# Patient Record
Sex: Female | Born: 1935 | Race: White | Hispanic: No | Smoking: Former smoker
Health system: Southern US, Community
[De-identification: ages and names within clinical notes are randomized; demographics above are authoritative.]

## PROBLEM LIST (undated history)

## (undated) DIAGNOSIS — I2510 Atherosclerotic heart disease of native coronary artery without angina pectoris: Secondary | ICD-10-CM

## (undated) DIAGNOSIS — G459 Transient cerebral ischemic attack, unspecified: Secondary | ICD-10-CM

## (undated) DIAGNOSIS — F419 Anxiety disorder, unspecified: Secondary | ICD-10-CM

## (undated) DIAGNOSIS — I255 Ischemic cardiomyopathy: Secondary | ICD-10-CM

## (undated) DIAGNOSIS — J189 Pneumonia, unspecified organism: Secondary | ICD-10-CM

## (undated) DIAGNOSIS — E669 Obesity, unspecified: Secondary | ICD-10-CM

## (undated) DIAGNOSIS — I82409 Acute embolism and thrombosis of unspecified deep veins of unspecified lower extremity: Secondary | ICD-10-CM

## (undated) DIAGNOSIS — D649 Anemia, unspecified: Secondary | ICD-10-CM

## (undated) DIAGNOSIS — R0989 Other specified symptoms and signs involving the circulatory and respiratory systems: Secondary | ICD-10-CM

## (undated) DIAGNOSIS — N183 Chronic kidney disease, stage 3 unspecified: Secondary | ICD-10-CM

## (undated) DIAGNOSIS — I10 Essential (primary) hypertension: Secondary | ICD-10-CM

## (undated) DIAGNOSIS — E785 Hyperlipidemia, unspecified: Secondary | ICD-10-CM

## (undated) DIAGNOSIS — E119 Type 2 diabetes mellitus without complications: Secondary | ICD-10-CM

## (undated) DIAGNOSIS — I639 Cerebral infarction, unspecified: Secondary | ICD-10-CM

## (undated) DIAGNOSIS — I6529 Occlusion and stenosis of unspecified carotid artery: Secondary | ICD-10-CM

## (undated) DIAGNOSIS — R809 Proteinuria, unspecified: Secondary | ICD-10-CM

## (undated) DIAGNOSIS — I214 Non-ST elevation (NSTEMI) myocardial infarction: Secondary | ICD-10-CM

## (undated) HISTORY — DX: Occlusion and stenosis of unspecified carotid artery: I65.29

## (undated) HISTORY — DX: Other specified symptoms and signs involving the circulatory and respiratory systems: R09.89

## (undated) HISTORY — DX: Hyperlipidemia, unspecified: E78.5

## (undated) HISTORY — PX: CATARACT EXTRACTION, BILATERAL: SHX1313

## (undated) HISTORY — PX: APPENDECTOMY: SHX54

## (undated) HISTORY — DX: Non-ST elevation (NSTEMI) myocardial infarction: I21.4

## (undated) HISTORY — DX: Cerebral infarction, unspecified: I63.9

## (undated) HISTORY — PX: ANKLE FUSION: SHX881

## (undated) HISTORY — DX: Acute embolism and thrombosis of unspecified deep veins of unspecified lower extremity: I82.409

## (undated) HISTORY — DX: Essential (primary) hypertension: I10

## (undated) HISTORY — PX: TONSILLECTOMY: SUR1361

## (undated) HISTORY — DX: Obesity, unspecified: E66.9

## (undated) HISTORY — DX: Ischemic cardiomyopathy: I25.5

## (undated) HISTORY — DX: Anxiety disorder, unspecified: F41.9

## (undated) HISTORY — DX: Transient cerebral ischemic attack, unspecified: G45.9

## (undated) HISTORY — DX: Anemia, unspecified: D64.9

## (undated) HISTORY — DX: Proteinuria, unspecified: R80.9

## (undated) HISTORY — DX: Atherosclerotic heart disease of native coronary artery without angina pectoris: I25.10

---

## 1997-11-25 ENCOUNTER — Other Ambulatory Visit: Admission: RE | Admit: 1997-11-25 | Discharge: 1997-11-25 | Payer: Self-pay | Admitting: Family Medicine

## 1998-01-05 ENCOUNTER — Ambulatory Visit (HOSPITAL_COMMUNITY): Admission: RE | Admit: 1998-01-05 | Discharge: 1998-01-05 | Payer: Self-pay

## 1998-03-25 ENCOUNTER — Ambulatory Visit (HOSPITAL_COMMUNITY): Admission: RE | Admit: 1998-03-25 | Discharge: 1998-03-25 | Payer: Self-pay | Admitting: Family Medicine

## 1999-01-11 ENCOUNTER — Other Ambulatory Visit: Admission: RE | Admit: 1999-01-11 | Discharge: 1999-01-11 | Payer: Self-pay | Admitting: Family Medicine

## 1999-03-01 ENCOUNTER — Inpatient Hospital Stay (HOSPITAL_COMMUNITY): Admission: EM | Admit: 1999-03-01 | Discharge: 1999-03-02 | Payer: Self-pay | Admitting: Emergency Medicine

## 1999-03-01 ENCOUNTER — Encounter: Payer: Self-pay | Admitting: Emergency Medicine

## 1999-03-02 ENCOUNTER — Encounter: Payer: Self-pay | Admitting: Cardiology

## 2000-06-22 ENCOUNTER — Inpatient Hospital Stay (HOSPITAL_COMMUNITY): Admission: EM | Admit: 2000-06-22 | Discharge: 2000-06-27 | Payer: Self-pay | Admitting: Emergency Medicine

## 2000-06-22 ENCOUNTER — Encounter: Payer: Self-pay | Admitting: Orthopedic Surgery

## 2000-06-22 ENCOUNTER — Encounter: Payer: Self-pay | Admitting: Emergency Medicine

## 2001-04-26 ENCOUNTER — Ambulatory Visit (HOSPITAL_COMMUNITY): Admission: RE | Admit: 2001-04-26 | Discharge: 2001-04-26 | Payer: Self-pay | Admitting: Orthopedic Surgery

## 2001-11-05 ENCOUNTER — Encounter: Payer: Self-pay | Admitting: Orthopedic Surgery

## 2001-11-05 ENCOUNTER — Encounter: Admission: RE | Admit: 2001-11-05 | Discharge: 2001-11-05 | Payer: Self-pay | Admitting: Orthopedic Surgery

## 2001-11-06 ENCOUNTER — Ambulatory Visit (HOSPITAL_BASED_OUTPATIENT_CLINIC_OR_DEPARTMENT_OTHER): Admission: RE | Admit: 2001-11-06 | Discharge: 2001-11-06 | Payer: Self-pay | Admitting: Orthopedic Surgery

## 2001-12-17 ENCOUNTER — Encounter: Payer: Self-pay | Admitting: Orthopedic Surgery

## 2001-12-17 ENCOUNTER — Encounter: Admission: RE | Admit: 2001-12-17 | Discharge: 2001-12-17 | Payer: Self-pay | Admitting: Orthopedic Surgery

## 2001-12-18 ENCOUNTER — Ambulatory Visit (HOSPITAL_BASED_OUTPATIENT_CLINIC_OR_DEPARTMENT_OTHER): Admission: RE | Admit: 2001-12-18 | Discharge: 2001-12-18 | Payer: Self-pay | Admitting: Orthopedic Surgery

## 2003-01-06 ENCOUNTER — Encounter: Payer: Self-pay | Admitting: Emergency Medicine

## 2003-01-06 ENCOUNTER — Emergency Department (HOSPITAL_COMMUNITY): Admission: EM | Admit: 2003-01-06 | Discharge: 2003-01-06 | Payer: Self-pay | Admitting: Emergency Medicine

## 2003-03-22 HISTORY — PX: FRACTURE SURGERY: SHX138

## 2003-08-06 ENCOUNTER — Ambulatory Visit (HOSPITAL_COMMUNITY): Admission: RE | Admit: 2003-08-06 | Discharge: 2003-08-06 | Payer: Self-pay | Admitting: Gastroenterology

## 2003-08-06 ENCOUNTER — Encounter (INDEPENDENT_AMBULATORY_CARE_PROVIDER_SITE_OTHER): Payer: Self-pay | Admitting: Specialist

## 2004-01-21 ENCOUNTER — Ambulatory Visit (HOSPITAL_COMMUNITY): Admission: RE | Admit: 2004-01-21 | Discharge: 2004-01-21 | Payer: Self-pay | Admitting: Cardiology

## 2004-06-02 ENCOUNTER — Ambulatory Visit (HOSPITAL_COMMUNITY): Admission: RE | Admit: 2004-06-02 | Discharge: 2004-06-02 | Payer: Self-pay | Admitting: Family Medicine

## 2004-10-22 ENCOUNTER — Ambulatory Visit (HOSPITAL_COMMUNITY): Admission: RE | Admit: 2004-10-22 | Discharge: 2004-10-22 | Payer: Self-pay | Admitting: Family Medicine

## 2005-03-04 ENCOUNTER — Encounter: Admission: RE | Admit: 2005-03-04 | Discharge: 2005-03-04 | Payer: Self-pay | Admitting: Family Medicine

## 2005-07-22 ENCOUNTER — Ambulatory Visit (HOSPITAL_COMMUNITY): Admission: RE | Admit: 2005-07-22 | Discharge: 2005-07-22 | Payer: Self-pay | Admitting: Cardiology

## 2005-07-22 ENCOUNTER — Encounter (INDEPENDENT_AMBULATORY_CARE_PROVIDER_SITE_OTHER): Payer: Self-pay | Admitting: *Deleted

## 2005-10-11 ENCOUNTER — Ambulatory Visit (HOSPITAL_COMMUNITY): Admission: RE | Admit: 2005-10-11 | Discharge: 2005-10-11 | Payer: Self-pay | Admitting: Vascular Surgery

## 2005-11-08 ENCOUNTER — Ambulatory Visit (HOSPITAL_BASED_OUTPATIENT_CLINIC_OR_DEPARTMENT_OTHER): Admission: RE | Admit: 2005-11-08 | Discharge: 2005-11-08 | Payer: Self-pay | Admitting: Orthopedic Surgery

## 2007-04-23 ENCOUNTER — Encounter: Admission: RE | Admit: 2007-04-23 | Discharge: 2007-04-23 | Payer: Self-pay | Admitting: Nephrology

## 2008-03-10 ENCOUNTER — Ambulatory Visit: Payer: Self-pay | Admitting: Internal Medicine

## 2008-03-11 ENCOUNTER — Encounter: Payer: Self-pay | Admitting: Cardiology

## 2008-03-11 ENCOUNTER — Ambulatory Visit: Payer: Self-pay | Admitting: Vascular Surgery

## 2008-03-11 ENCOUNTER — Inpatient Hospital Stay (HOSPITAL_COMMUNITY): Admission: EM | Admit: 2008-03-11 | Discharge: 2008-03-12 | Payer: Self-pay | Admitting: Emergency Medicine

## 2009-01-22 ENCOUNTER — Encounter (HOSPITAL_BASED_OUTPATIENT_CLINIC_OR_DEPARTMENT_OTHER): Admission: RE | Admit: 2009-01-22 | Discharge: 2009-03-24 | Payer: Self-pay | Admitting: Internal Medicine

## 2009-02-06 ENCOUNTER — Ambulatory Visit (HOSPITAL_COMMUNITY): Admission: RE | Admit: 2009-02-06 | Discharge: 2009-02-06 | Payer: Self-pay | Admitting: Internal Medicine

## 2009-03-25 ENCOUNTER — Encounter (HOSPITAL_BASED_OUTPATIENT_CLINIC_OR_DEPARTMENT_OTHER): Admission: RE | Admit: 2009-03-25 | Discharge: 2009-06-10 | Payer: Self-pay | Admitting: Internal Medicine

## 2009-05-05 ENCOUNTER — Ambulatory Visit: Admission: RE | Admit: 2009-05-05 | Discharge: 2009-05-05 | Payer: Self-pay | Admitting: General Surgery

## 2009-05-05 ENCOUNTER — Ambulatory Visit: Payer: Self-pay | Admitting: Vascular Surgery

## 2009-05-05 ENCOUNTER — Encounter (HOSPITAL_BASED_OUTPATIENT_CLINIC_OR_DEPARTMENT_OTHER): Payer: Self-pay | Admitting: General Surgery

## 2009-05-11 ENCOUNTER — Ambulatory Visit (HOSPITAL_COMMUNITY): Admission: RE | Admit: 2009-05-11 | Discharge: 2009-05-11 | Payer: Self-pay | Admitting: Internal Medicine

## 2009-05-22 ENCOUNTER — Ambulatory Visit: Payer: Self-pay | Admitting: Vascular Surgery

## 2009-05-27 ENCOUNTER — Ambulatory Visit (HOSPITAL_COMMUNITY): Admission: RE | Admit: 2009-05-27 | Discharge: 2009-05-27 | Payer: Self-pay | Admitting: General Surgery

## 2009-06-04 ENCOUNTER — Ambulatory Visit (HOSPITAL_COMMUNITY): Admission: RE | Admit: 2009-06-04 | Discharge: 2009-06-04 | Payer: Self-pay | Admitting: General Surgery

## 2009-06-04 ENCOUNTER — Encounter (HOSPITAL_BASED_OUTPATIENT_CLINIC_OR_DEPARTMENT_OTHER): Payer: Self-pay | Admitting: General Surgery

## 2009-06-11 ENCOUNTER — Encounter (HOSPITAL_BASED_OUTPATIENT_CLINIC_OR_DEPARTMENT_OTHER): Admission: RE | Admit: 2009-06-11 | Discharge: 2009-07-10 | Payer: Self-pay | Admitting: Internal Medicine

## 2009-06-24 ENCOUNTER — Encounter (HOSPITAL_BASED_OUTPATIENT_CLINIC_OR_DEPARTMENT_OTHER): Payer: Self-pay | Admitting: General Surgery

## 2009-06-24 ENCOUNTER — Ambulatory Visit: Payer: Self-pay | Admitting: Vascular Surgery

## 2009-06-24 ENCOUNTER — Ambulatory Visit: Admission: RE | Admit: 2009-06-24 | Discharge: 2009-06-24 | Payer: Self-pay | Admitting: General Surgery

## 2009-07-13 ENCOUNTER — Encounter (HOSPITAL_BASED_OUTPATIENT_CLINIC_OR_DEPARTMENT_OTHER): Admission: RE | Admit: 2009-07-13 | Discharge: 2009-08-03 | Payer: Self-pay | Admitting: Internal Medicine

## 2009-08-04 ENCOUNTER — Encounter (HOSPITAL_BASED_OUTPATIENT_CLINIC_OR_DEPARTMENT_OTHER): Admission: RE | Admit: 2009-08-04 | Discharge: 2009-08-25 | Payer: Self-pay | Admitting: General Surgery

## 2009-08-26 ENCOUNTER — Encounter (HOSPITAL_BASED_OUTPATIENT_CLINIC_OR_DEPARTMENT_OTHER): Admission: RE | Admit: 2009-08-26 | Discharge: 2009-09-18 | Payer: Self-pay | Admitting: General Surgery

## 2009-09-05 ENCOUNTER — Inpatient Hospital Stay (HOSPITAL_COMMUNITY): Admission: EM | Admit: 2009-09-05 | Discharge: 2009-09-08 | Payer: Self-pay | Admitting: Emergency Medicine

## 2009-09-07 ENCOUNTER — Ambulatory Visit: Payer: Self-pay | Admitting: Vascular Surgery

## 2009-09-07 ENCOUNTER — Encounter (INDEPENDENT_AMBULATORY_CARE_PROVIDER_SITE_OTHER): Payer: Self-pay | Admitting: Internal Medicine

## 2010-01-13 ENCOUNTER — Ambulatory Visit: Payer: Self-pay | Admitting: Cardiology

## 2010-03-21 DIAGNOSIS — I214 Non-ST elevation (NSTEMI) myocardial infarction: Secondary | ICD-10-CM

## 2010-03-21 HISTORY — DX: Non-ST elevation (NSTEMI) myocardial infarction: I21.4

## 2010-04-03 ENCOUNTER — Telehealth (INDEPENDENT_AMBULATORY_CARE_PROVIDER_SITE_OTHER): Payer: Self-pay | Admitting: *Deleted

## 2010-04-03 ENCOUNTER — Inpatient Hospital Stay (HOSPITAL_COMMUNITY)
Admission: EM | Admit: 2010-04-03 | Discharge: 2010-04-13 | Payer: Self-pay | Source: Home / Self Care | Attending: Cardiology | Admitting: Cardiology

## 2010-04-05 HISTORY — PX: CORONARY ANGIOPLASTY WITH STENT PLACEMENT: SHX49

## 2010-04-05 LAB — BASIC METABOLIC PANEL
BUN: 19 mg/dL (ref 6–23)
BUN: 20 mg/dL (ref 6–23)
CO2: 23 mEq/L (ref 19–32)
CO2: 26 mEq/L (ref 19–32)
Calcium: 9.1 mg/dL (ref 8.4–10.5)
Calcium: 8.8 mg/dL (ref 8.4–10.5)
Chloride: 113 mEq/L — ABNORMAL HIGH (ref 96–112)
Chloride: 110 mEq/L (ref 96–112)
Creatinine, Ser: 1.3 mg/dL — ABNORMAL HIGH (ref 0.4–1.2)
Creatinine, Ser: 1.4 mg/dL — ABNORMAL HIGH (ref 0.4–1.2)
GFR calc Af Amer: 48 mL/min — ABNORMAL LOW (ref >60)
GFR calc Af Amer: 44 mL/min — ABNORMAL LOW (ref >60)
GFR calc non Af Amer: 39 mL/min — ABNORMAL LOW (ref >60)
GFR calc non Af Amer: 36 mL/min — ABNORMAL LOW (ref >60)
Glucose, Bld: 123 mg/dL — ABNORMAL HIGH (ref 70–99)
Glucose, Bld: 152 mg/dL — ABNORMAL HIGH (ref 70–99)
Potassium: 4.1 mEq/L (ref 3.5–5.1)
Potassium: 4.8 mEq/L (ref 3.5–5.1)
Sodium: 142 mEq/L (ref 135–145)
Sodium: 142 mEq/L (ref 135–145)

## 2010-04-05 LAB — CARDIAC PANEL(CRET KIN+CKTOT+MB+TROPI)
CK, MB: 3.1 ng/mL (ref 0.3–4.0)
CK, MB: 2.8 ng/mL (ref 0.3–4.0)
Relative Index: INVALID (ref 0.0–2.5)
Relative Index: 2.6 — ABNORMAL HIGH (ref 0.0–2.5)
Total CK: 95 U/L (ref 7–177)
Total CK: 107 U/L (ref 7–177)
Troponin I: 0.1 ng/mL — ABNORMAL HIGH (ref 0.00–0.06)
Troponin I: 0.06 ng/mL (ref 0.00–0.06)

## 2010-04-05 LAB — DIFFERENTIAL
Basophils Absolute: 0 10*3/uL (ref 0.0–0.1)
Basophils Relative: 0 % (ref 0–1)
Eosinophils Absolute: 0 10*3/uL (ref 0.0–0.7)
Eosinophils Relative: 1 % (ref 0–5)
Lymphocytes Relative: 32 % (ref 12–46)
Lymphs Abs: 0.9 10*3/uL (ref 0.7–4.0)
Monocytes Absolute: 0.2 10*3/uL (ref 0.1–1.0)
Monocytes Relative: 8 % (ref 3–12)
Neutro Abs: 1.7 10*3/uL (ref 1.7–7.7)
Neutrophils Relative %: 59 % (ref 43–77)

## 2010-04-05 LAB — LIPID PANEL
Cholesterol: 108 mg/dL (ref 0–200)
HDL: 52 mg/dL (ref >39)
LDL Cholesterol: 36 mg/dL (ref 0–99)
Total CHOL/HDL Ratio: 2.1 RATIO
Triglycerides: 100 mg/dL (ref <150)
VLDL: 20 mg/dL (ref 0–40)

## 2010-04-05 LAB — HEMOGLOBIN A1C
Hgb A1c MFr Bld: 7.6 % — ABNORMAL HIGH (ref <5.7)
Mean Plasma Glucose: 171 mg/dL — ABNORMAL HIGH (ref <117)

## 2010-04-05 LAB — GLUCOSE, CAPILLARY
Glucose-Capillary: 121 mg/dL — ABNORMAL HIGH (ref 70–99)
Glucose-Capillary: 144 mg/dL — ABNORMAL HIGH (ref 70–99)
Glucose-Capillary: 140 mg/dL — ABNORMAL HIGH (ref 70–99)
Glucose-Capillary: 97 mg/dL (ref 70–99)

## 2010-04-05 LAB — CBC
HCT: 34 % — ABNORMAL LOW (ref 36.0–46.0)
HCT: 31.7 % — ABNORMAL LOW (ref 36.0–46.0)
Hemoglobin: 11.1 g/dL — ABNORMAL LOW (ref 12.0–15.0)
Hemoglobin: 10.5 g/dL — ABNORMAL LOW (ref 12.0–15.0)
MCH: 30.1 pg (ref 26.0–34.0)
MCH: 30.3 pg (ref 26.0–34.0)
MCHC: 32.6 g/dL (ref 30.0–36.0)
MCHC: 33.1 g/dL (ref 30.0–36.0)
MCV: 92.1 fL (ref 78.0–100.0)
MCV: 91.6 fL (ref 78.0–100.0)
Platelets: 157 10*3/uL (ref 150–400)
Platelets: 147 10*3/uL — ABNORMAL LOW (ref 150–400)
RBC: 3.69 MIL/uL — ABNORMAL LOW (ref 3.87–5.11)
RBC: 3.46 MIL/uL — ABNORMAL LOW (ref 3.87–5.11)
RDW: 12.4 % (ref 11.5–15.5)
RDW: 12.5 % (ref 11.5–15.5)
WBC: 2.8 10*3/uL — ABNORMAL LOW (ref 4.0–10.5)
WBC: 4.6 10*3/uL (ref 4.0–10.5)

## 2010-04-05 LAB — TROPONIN I
Troponin I: 0.09 ng/mL — ABNORMAL HIGH (ref 0.00–0.06)
Troponin I: 0.16 ng/mL — ABNORMAL HIGH (ref 0.00–0.06)

## 2010-04-05 LAB — TSH: TSH: 2.297 u[IU]/mL (ref 0.350–4.500)

## 2010-04-05 LAB — CK TOTAL AND CKMB (NOT AT ARMC)
CK, MB: 2.6 ng/mL (ref 0.3–4.0)
CK, MB: 3.1 ng/mL (ref 0.3–4.0)
Relative Index: INVALID (ref 0.0–2.5)
Relative Index: INVALID (ref 0.0–2.5)
Total CK: 95 U/L (ref 7–177)
Total CK: 92 U/L (ref 7–177)

## 2010-04-05 LAB — HEPARIN LEVEL (UNFRACTIONATED)
Heparin Unfractionated: 0.43 IU/mL (ref 0.30–0.70)
Heparin Unfractionated: 0.33 IU/mL (ref 0.30–0.70)

## 2010-04-05 LAB — MRSA PCR SCREENING: MRSA by PCR: NEGATIVE

## 2010-04-07 LAB — CBC
HCT: 32.7 % — ABNORMAL LOW (ref 36.0–46.0)
HCT: 29.2 % — ABNORMAL LOW (ref 36.0–46.0)
Hemoglobin: 10.8 g/dL — ABNORMAL LOW (ref 12.0–15.0)
Hemoglobin: 9.6 g/dL — ABNORMAL LOW (ref 12.0–15.0)
MCH: 30.1 pg (ref 26.0–34.0)
MCH: 30.3 pg (ref 26.0–34.0)
MCHC: 33 g/dL (ref 30.0–36.0)
MCHC: 32.9 g/dL (ref 30.0–36.0)
MCV: 91.1 fL (ref 78.0–100.0)
MCV: 92.1 fL (ref 78.0–100.0)
Platelets: 148 10*3/uL — ABNORMAL LOW (ref 150–400)
Platelets: 140 10*3/uL — ABNORMAL LOW (ref 150–400)
RBC: 3.59 MIL/uL — ABNORMAL LOW (ref 3.87–5.11)
RBC: 3.17 MIL/uL — ABNORMAL LOW (ref 3.87–5.11)
RDW: 12.4 % (ref 11.5–15.5)
RDW: 12.6 % (ref 11.5–15.5)
WBC: 3.3 10*3/uL — ABNORMAL LOW (ref 4.0–10.5)
WBC: 2.9 10*3/uL — ABNORMAL LOW (ref 4.0–10.5)

## 2010-04-07 LAB — GLUCOSE, CAPILLARY
Glucose-Capillary: 121 mg/dL — ABNORMAL HIGH (ref 70–99)
Glucose-Capillary: 98 mg/dL (ref 70–99)
Glucose-Capillary: 141 mg/dL — ABNORMAL HIGH (ref 70–99)
Glucose-Capillary: 95 mg/dL (ref 70–99)
Glucose-Capillary: 82 mg/dL (ref 70–99)
Glucose-Capillary: 78 mg/dL (ref 70–99)
Glucose-Capillary: 154 mg/dL — ABNORMAL HIGH (ref 70–99)
Glucose-Capillary: 129 mg/dL — ABNORMAL HIGH (ref 70–99)

## 2010-04-07 LAB — BASIC METABOLIC PANEL
BUN: 24 mg/dL — ABNORMAL HIGH (ref 6–23)
BUN: 23 mg/dL (ref 6–23)
BUN: 21 mg/dL (ref 6–23)
CO2: 27 mEq/L (ref 19–32)
CO2: 28 mEq/L (ref 19–32)
CO2: 25 mEq/L (ref 19–32)
Calcium: 8.7 mg/dL (ref 8.4–10.5)
Calcium: 8.5 mg/dL (ref 8.4–10.5)
Calcium: 8.6 mg/dL (ref 8.4–10.5)
Chloride: 103 mEq/L (ref 96–112)
Chloride: 105 mEq/L (ref 96–112)
Chloride: 115 mEq/L — ABNORMAL HIGH (ref 96–112)
Creatinine, Ser: 1.48 mg/dL — ABNORMAL HIGH (ref 0.4–1.2)
Creatinine, Ser: 1.43 mg/dL — ABNORMAL HIGH (ref 0.4–1.2)
Creatinine, Ser: 1.27 mg/dL — ABNORMAL HIGH (ref 0.4–1.2)
GFR calc Af Amer: 41 mL/min — ABNORMAL LOW (ref >60)
GFR calc Af Amer: 43 mL/min — ABNORMAL LOW (ref >60)
GFR calc Af Amer: 49 mL/min — ABNORMAL LOW (ref >60)
GFR calc non Af Amer: 34 mL/min — ABNORMAL LOW (ref >60)
GFR calc non Af Amer: 35 mL/min — ABNORMAL LOW (ref >60)
GFR calc non Af Amer: 40 mL/min — ABNORMAL LOW (ref >60)
Glucose, Bld: 109 mg/dL — ABNORMAL HIGH (ref 70–99)
Glucose, Bld: 161 mg/dL — ABNORMAL HIGH (ref 70–99)
Glucose, Bld: 88 mg/dL (ref 70–99)
Potassium: 3.9 mEq/L (ref 3.5–5.1)
Potassium: 3.9 mEq/L (ref 3.5–5.1)
Potassium: 5 mEq/L (ref 3.5–5.1)
Sodium: 137 mEq/L (ref 135–145)
Sodium: 140 mEq/L (ref 135–145)
Sodium: 143 mEq/L (ref 135–145)

## 2010-04-07 LAB — PROTIME-INR
INR: 1.05 (ref 0.00–1.49)
Prothrombin Time: 13.9 seconds (ref 11.6–15.2)

## 2010-04-07 LAB — HEPARIN LEVEL (UNFRACTIONATED): Heparin Unfractionated: 0.47 IU/mL (ref 0.30–0.70)

## 2010-04-12 LAB — GLUCOSE, CAPILLARY
Glucose-Capillary: 292 mg/dL — ABNORMAL HIGH (ref 70–99)
Glucose-Capillary: 118 mg/dL — ABNORMAL HIGH (ref 70–99)
Glucose-Capillary: 225 mg/dL — ABNORMAL HIGH (ref 70–99)
Glucose-Capillary: 170 mg/dL — ABNORMAL HIGH (ref 70–99)
Glucose-Capillary: 203 mg/dL — ABNORMAL HIGH (ref 70–99)
Glucose-Capillary: 128 mg/dL — ABNORMAL HIGH (ref 70–99)
Glucose-Capillary: 206 mg/dL — ABNORMAL HIGH (ref 70–99)

## 2010-04-12 LAB — BASIC METABOLIC PANEL
BUN: 27 mg/dL — ABNORMAL HIGH (ref 6–23)
BUN: 35 mg/dL — ABNORMAL HIGH (ref 6–23)
CO2: 23 mEq/L (ref 19–32)
CO2: 18 mEq/L — ABNORMAL LOW (ref 19–32)
CO2: 21 mEq/L (ref 19–32)
Calcium: 8.5 mg/dL (ref 8.4–10.5)
Calcium: 8.8 mg/dL (ref 8.4–10.5)
Calcium: 8.5 mg/dL (ref 8.4–10.5)
Chloride: 113 mEq/L — ABNORMAL HIGH (ref 96–112)
Creatinine, Ser: 1.54 mg/dL — ABNORMAL HIGH (ref 0.4–1.2)
Creatinine, Ser: 1.48 mg/dL — ABNORMAL HIGH (ref 0.4–1.2)
Creatinine, Ser: 1.43 mg/dL — ABNORMAL HIGH (ref 0.4–1.2)
GFR calc Af Amer: 39 mL/min — ABNORMAL LOW (ref >60)
GFR calc Af Amer: 47 mL/min — ABNORMAL LOW (ref >60)
GFR calc non Af Amer: 32 mL/min — ABNORMAL LOW (ref >60)
GFR calc non Af Amer: 39 mL/min — ABNORMAL LOW (ref >60)
Potassium: 5.3 mEq/L — ABNORMAL HIGH (ref 3.5–5.1)
Potassium: 5.2 mEq/L — ABNORMAL HIGH (ref 3.5–5.1)
Sodium: 141 mEq/L (ref 135–145)
Sodium: 141 mEq/L (ref 135–145)

## 2010-04-12 LAB — CBC
Hemoglobin: 9 g/dL — ABNORMAL LOW (ref 12.0–15.0)
Hemoglobin: 9.4 g/dL — ABNORMAL LOW (ref 12.0–15.0)
MCH: 30.1 pg (ref 26.0–34.0)
MCV: 90.9 fL (ref 78.0–100.0)
MCV: 91.8 fL (ref 78.0–100.0)
MCV: 92.3 fL (ref 78.0–100.0)
Platelets: 141 10*3/uL — ABNORMAL LOW (ref 150–400)
Platelets: 145 10*3/uL — ABNORMAL LOW (ref 150–400)
RDW: 12.5 % (ref 11.5–15.5)
RDW: 12.5 % (ref 11.5–15.5)
WBC: 3 10*3/uL — ABNORMAL LOW (ref 4.0–10.5)

## 2010-04-12 LAB — BRAIN NATRIURETIC PEPTIDE: Pro B Natriuretic peptide (BNP): 144 pg/mL — ABNORMAL HIGH (ref 0.0–100.0)

## 2010-04-13 LAB — CBC
HCT: 26.8 % — ABNORMAL LOW (ref 36.0–46.0)
Hemoglobin: 8.9 g/dL — ABNORMAL LOW (ref 12.0–15.0)
MCH: 29.8 pg (ref 26.0–34.0)
MCH: 29.6 pg (ref 26.0–34.0)
MCH: 30.4 pg (ref 26.0–34.0)
MCHC: 33.2 g/dL (ref 30.0–36.0)
MCV: 91.5 fL (ref 78.0–100.0)
MCV: 91.8 fL (ref 78.0–100.0)
Platelets: 142 10*3/uL — ABNORMAL LOW (ref 150–400)
Platelets: 142 10*3/uL — ABNORMAL LOW (ref 150–400)
RDW: 12.8 % (ref 11.5–15.5)
RDW: 12.7 % (ref 11.5–15.5)
WBC: 3.4 10*3/uL — ABNORMAL LOW (ref 4.0–10.5)

## 2010-04-13 LAB — GLUCOSE, CAPILLARY
Glucose-Capillary: 134 mg/dL — ABNORMAL HIGH (ref 70–99)
Glucose-Capillary: 163 mg/dL — ABNORMAL HIGH (ref 70–99)
Glucose-Capillary: 131 mg/dL — ABNORMAL HIGH (ref 70–99)
Glucose-Capillary: 156 mg/dL — ABNORMAL HIGH (ref 70–99)

## 2010-04-13 LAB — BASIC METABOLIC PANEL
BUN: 32 mg/dL — ABNORMAL HIGH (ref 6–23)
BUN: 31 mg/dL — ABNORMAL HIGH (ref 6–23)
CO2: 22 mEq/L (ref 19–32)
CO2: 19 mEq/L (ref 19–32)
Calcium: 8.5 mg/dL (ref 8.4–10.5)
Chloride: 115 mEq/L — ABNORMAL HIGH (ref 96–112)
Chloride: 117 mEq/L — ABNORMAL HIGH (ref 96–112)
Creatinine, Ser: 1.34 mg/dL — ABNORMAL HIGH (ref 0.4–1.2)
Creatinine, Ser: 1.27 mg/dL — ABNORMAL HIGH (ref 0.4–1.2)
GFR calc non Af Amer: 38 mL/min — ABNORMAL LOW (ref >60)
Glucose, Bld: 125 mg/dL — ABNORMAL HIGH (ref 70–99)
Sodium: 141 mEq/L (ref 135–145)

## 2010-04-14 LAB — BASIC METABOLIC PANEL
BUN: 29 mg/dL — ABNORMAL HIGH (ref 6–23)
CO2: 25 mEq/L (ref 19–32)
Chloride: 111 mEq/L (ref 96–112)
Glucose, Bld: 150 mg/dL — ABNORMAL HIGH (ref 70–99)
Potassium: 4.9 mEq/L (ref 3.5–5.1)

## 2010-04-14 LAB — CBC
HCT: 27.4 % — ABNORMAL LOW (ref 36.0–46.0)
MCV: 89.5 fL (ref 78.0–100.0)
RBC: 3.06 MIL/uL — ABNORMAL LOW (ref 3.87–5.11)
RDW: 12.5 % (ref 11.5–15.5)
WBC: 3.7 10*3/uL — ABNORMAL LOW (ref 4.0–10.5)

## 2010-04-15 ENCOUNTER — Emergency Department (HOSPITAL_COMMUNITY)
Admission: EM | Admit: 2010-04-15 | Discharge: 2010-04-15 | Disposition: A | Discharge status: Other Acute Inpatient Hospital | Payer: Self-pay | Source: Home / Self Care | Admitting: Emergency Medicine

## 2010-04-15 ENCOUNTER — Inpatient Hospital Stay (HOSPITAL_COMMUNITY)
Admission: AD | Admit: 2010-04-15 | Discharge: 2010-04-17 | Payer: Self-pay | Attending: Cardiology | Admitting: Cardiology

## 2010-04-15 LAB — POCT CARDIAC MARKERS
CKMB, poc: <1 ng/mL — ABNORMAL LOW (ref 1.0–8.0)
Troponin i, poc: <0.05 ng/mL (ref 0.00–0.09)

## 2010-04-15 LAB — COMPREHENSIVE METABOLIC PANEL
AST: 16 U/L (ref 0–37)
Albumin: 3.1 g/dL — ABNORMAL LOW (ref 3.5–5.2)
BUN: 34 mg/dL — ABNORMAL HIGH (ref 6–23)
Calcium: 8.9 mg/dL (ref 8.4–10.5)
Creatinine, Ser: 1.45 mg/dL — ABNORMAL HIGH (ref 0.4–1.2)
GFR calc Af Amer: 42 mL/min — ABNORMAL LOW (ref >60)
GFR calc non Af Amer: 35 mL/min — ABNORMAL LOW (ref >60)

## 2010-04-15 LAB — URINALYSIS, ROUTINE W REFLEX MICROSCOPIC
Bilirubin Urine: NEGATIVE
Hgb urine dipstick: NEGATIVE
Ketones, ur: NEGATIVE mg/dL
Urine Glucose, Fasting: NEGATIVE mg/dL
pH: 6 (ref 5.0–8.0)

## 2010-04-15 LAB — DIFFERENTIAL
Basophils Absolute: 0 10*3/uL (ref 0.0–0.1)
Lymphocytes Relative: 26 % (ref 12–46)
Monocytes Absolute: 0.3 10*3/uL (ref 0.1–1.0)
Neutro Abs: 2.2 10*3/uL (ref 1.7–7.7)
Neutrophils Relative %: 62 % (ref 43–77)

## 2010-04-15 LAB — CARDIAC PANEL(CRET KIN+CKTOT+MB+TROPI)
Relative Index: INVALID (ref 0.0–2.5)
Troponin I: 0.01 ng/mL (ref 0.00–0.06)

## 2010-04-15 LAB — CBC
HCT: 28.3 % — ABNORMAL LOW (ref 36.0–46.0)
Hemoglobin: 9.3 g/dL — ABNORMAL LOW (ref 12.0–15.0)
MCHC: 32.9 g/dL (ref 30.0–36.0)
RBC: 3.08 MIL/uL — ABNORMAL LOW (ref 3.87–5.11)

## 2010-04-15 LAB — PROTIME-INR: INR: 1.09 (ref 0.00–1.49)

## 2010-04-15 LAB — GLUCOSE, CAPILLARY: Glucose-Capillary: 77 mg/dL (ref 70–99)

## 2010-04-15 NOTE — H&P (Addendum)
NAME:  [name], ROMMEL NO.:  [id number]  MEDICAL RECORD NO.:  [id number]          PATIENT TYPE:  INP  LOCATION:  2919                         FACILITY:  [facility]  PHYSICIAN:  Colleen Can. Deborah Chalk, M.D.DATE OF BIRTH:  Aug 19, 1927  DATE OF ADMISSION:  04/03/2010 DATE OF DISCHARGE:                             HISTORY & PHYSICAL   REASON FOR ADMISSION:  Chest pain with positive cardiac enzymes.  HISTORY:  [name] is an 77 year old female who woke up at 4:00 a.m. this morning with indigestion, burping sensation, and low-grade headache just like her previous myocardial infarction.  She tried to eat breakfast and have a sip of coffee, but just did not feel well.  Her blood pressure was in the 200 range.  She took her morning medicines somewhat late and had persistence of the chest pain that resolved after one nitroglycerin.  She subsequently had positive cardiac enzymes in the emergency room and is admitted now for evaluation.  She has a history of previous inferior myocardial infarction in 1996 with angioplasty and Laural Benes and Johnson stent to the right coronary artery.  She has done reasonably well since that time with a low normal ejection fraction.  PAST MEDICAL HISTORY:  She has known ischemic heart disease with prior myocardial infarction in 1996 treated with angioplasty and the J and J stent.  She had a stress test in November 2005 with ejection fraction of 52%.  She has had previous history of heart failure, diabetes, claudication, hyperlipidemia, and previous carotid bruits documented. She has had renal insufficiency and chronic obesity.  She has had a history of gout.  She was seen by Vascular Surgery in 2007.  She was hospitalized in 2009 and in June 2011 with TIAs.  CURRENT MEDICATIONS: 1. Victoza. 2. Amlodipine 2.5 mg per day. 3. Bystolic 20 mg a day. 4. Diovan 320 mg day. 5. WelChol 675 mg 3 tablets twice a day. 6. Lovaza 4 g a day. 7. Vytorin 10-21  a day. 8. Pletal 100 mg per day. 9. KCl 20 mEq per day. 10.Lasix 80 mg a day. 11.Aspirin 81 mg a day. 12.Previously on Tekturna but recently discontinued.  SOCIAL HISTORY:  She currently lives with her daughter and a friend. She does not smoke or use alcohol.  She stopped smoking in 1970.  FAMILY HISTORY:  Both parents were murdered.  She is basically raised as an orphan with another sister.  ALLERGIES:  PENICILLIN and SULFA.  PHYSICAL EXAMINATION:  GENERAL:  She is a pleasant elderly white female in no acute distress.  Skin is warm and dry.  Color is normal. VITAL SIGNS:  Her blood pressure was 215/72, heart rate 86.  O2 sats 97% on room air. HEENT:  Remarkable for dentures.  She has dry mucous membranes. NECK:  Supple without bruits. LUNGS:  Clear. HEART:  Regular rate and rhythm. ABDOMEN:  Soft and nontender. EXTREMITIES:  Without edema to 1+ edema.  There are adequate pedal pulses present.  Chest x-ray shows borderline cardiomegaly without acute change.  Her EKG shows an incomplete left bundle-branch block with poor R-wave progression anteriorly and normal sinus  rhythm.  OVERALL IMPRESSION: 1. Non-ST elevation myocardial infarction. 2. Known coronary artery disease with remote inferior myocardial     infarction with stent to the right coronary artery. 3. Diabetes mellitus. 4. History of claudication. 5. Gout. 6. History of renal insufficiency.  PLAN:  The patient was admitted, placed on IV heparin as well as IV nitroglycerin.  We will continue medicines and increase amlodipine to try to control blood pressure better.  We will have her on Bystolic 20 mg a day.  We will continue her anti-lipid regimen that precludes use of statin therapy.  We will tentatively plan for her to have elective cardiac catheterization early in the week.     Colleen Can. Deborah Chalk, M.D.     SNT/MEDQ  D:  04/03/2010  T:  04/04/2010  Job:  [id number]  cc:   Birdena Jubilee, MD [facility]  Electronically Signed by Roger Shelter M.D. on 04/15/2010 03:38:47 PM

## 2010-04-15 NOTE — Procedures (Addendum)
NAME:  [name], ALDERFER NO.:  [id number]  MEDICAL RECORD NO.:  [id number]          PATIENT TYPE:  INP  LOCATION:  2919                         FACILITY:  [facility]  PHYSICIAN:  Arturo Morton. Riley Kill, MD, FACCDATE OF BIRTH:  August 19, 1927  DATE OF PROCEDURE:  04/05/2010 DATE OF DISCHARGE:                           CARDIAC CATHETERIZATION   INDICATIONS:  [name] is a delightful 77 year old followed by Dr. Deborah Chalk over a long period of time.  She has had a prior myocardial infarction.  She has [employer] stents in the mid right coronary. The patient had borderline troponin elevation.  Her symptoms were similar to what she had had with her previous infarct, although there were not completely classic.  Current study was done to assess her coronary anatomy.  Of importance, the patient has been hydrated both last night and during the day today.  Her creatinine is 1.4 with a creatinine clearance of less than 16.  Every attempt was made to consider reduction in contrast load.  Dr. Deborah Chalk and I discussed the case prior to proceeding, and were in agreement based on her clinical condition that we should.  PROCEDURES: 1. Left heart catheterization. 2. Selective coronary arteriography. 3. Percutaneous stenting of the right coronary artery using a non drug-     eluting stent platform.  DESCRIPTION OF PROCEDURE:  The patient was brought to the catheterization laboratory and prepped and draped in the usual fashion. We used 4-French catheters.  Views of the left and right coronary artery were obtained.  Following this, I discussed her case with Dr. Deborah Chalk. Given the contrast limitations, it was this feeling that she should have a staged PCI.  After I discussed this with the patient and subsequently with her family, we elected to proceed.  Her 4-French sheath was exchanged for a 5-French sheath.  Bivalirudin was given according to protocol.  600 mg of oral clopidogrel  was administered.  Following this, a standard right guide catheter was utilized.  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 Following this, we used an internal mammary guide to engage and a 5- [place] guide was utilized.  Predilatation was done with a 2.5-mm short balloon and then stenting was done with a 15 x 2.75 Abbott Vision stent. The stent was taken up to about 14-15 atmospheres and postdilated with a 3.25-mm balloon, first at 12 atmospheres and then at 16 atmospheres. There was marked improvement in the appearance of the artery.  All catheters were subsequently removed and the femoral sheath sewn into place.  Her bivalirudin was discontinued.  ACT was checked during the course of the procedure and it was adequate.  HEMODYNAMIC DATA: 1. The central aortic pressure was 168/66, mean 103. 2. LV pressure 168/9. 3. No gradient or pullback across the aortic valve.  ANGIOGRAPHIC DATA: 1. The left main coronary artery is free of critical disease. 2. The left anterior descending artery courses to the apex.  There was     about 40% narrowing in the LAD beyond the origin of the diagonal.     The diagonal itself is moderate in size with about 30-40%  proximal     narrowing and an eccentric 90% stenosis.  The vessel then     bifurcates and then bifurcates again distally.  The distal LAD is     without critical disease.  There is a ramus intermedius vessel that     is without significant narrowing.  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 3. The anomalous circumflex has diffuse 50-60% segmental disease all     the way across the proximal portion.  It courses into a single     marginal branch posteriorly. 4. The right coronary artery has been previously stented.  There was     about 20-30% narrowing throughout the proximal stents.  However,     they remain widely patent.  There is an 80-90% focal stenosis     distally.   Following stenting, this is reduced to 0% residual     luminal narrowing.  The PDA has about 60% segmental narrowing in     its proximal portion and 50% in its midportion.  There are two     large posterolateral branches without critical narrowing.  CONCLUSION: 1. High-grade stenosis of the diagonal, and distal right coronary     artery with successful percutaneous stenting. 2. Continued patency of the previously placed stents in the right     coronary artery. 3. 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 4. Moderate disease of the posterior descending branch as noted above.  DISPOSITION:  Given the patient's age, we will likely stage her diagonal.  It should be noted that the patient has some renal insufficiency and every effort was made today to reduce contrast load. This was discussed with her family as well.     Arturo Morton. Riley Kill, MD, [facility]     TDS/MEDQ  D:  04/05/2010  T:  04/06/2010  Job:  [id number]  cc:   Colleen Can. Deborah Chalk, M.D. [facility]  Electronically Signed by Shawnie Pons MD [facility] on 04/15/2010 04:44:31 AM

## 2010-04-16 LAB — GLUCOSE, CAPILLARY
Glucose-Capillary: 104 mg/dL — ABNORMAL HIGH (ref 70–99)
Glucose-Capillary: 121 mg/dL — ABNORMAL HIGH (ref 70–99)
Glucose-Capillary: 158 mg/dL — ABNORMAL HIGH (ref 70–99)
Glucose-Capillary: 116 mg/dL — ABNORMAL HIGH (ref 70–99)

## 2010-04-16 LAB — BASIC METABOLIC PANEL
BUN: 27 mg/dL — ABNORMAL HIGH (ref 6–23)
CO2: 23 mEq/L (ref 19–32)
Calcium: 8.3 mg/dL — ABNORMAL LOW (ref 8.4–10.5)
Chloride: 118 mEq/L — ABNORMAL HIGH (ref 96–112)
Creatinine, Ser: 1.2 mg/dL (ref 0.4–1.2)
GFR calc Af Amer: 52 mL/min — ABNORMAL LOW (ref >60)
Glucose, Bld: 73 mg/dL (ref 70–99)

## 2010-04-17 LAB — BASIC METABOLIC PANEL
CO2: 19 mEq/L (ref 19–32)
Calcium: 8.5 mg/dL (ref 8.4–10.5)
Chloride: 116 mEq/L — ABNORMAL HIGH (ref 96–112)
Creatinine, Ser: 1.27 mg/dL — ABNORMAL HIGH (ref 0.4–1.2)
Glucose, Bld: 132 mg/dL — ABNORMAL HIGH (ref 70–99)

## 2010-04-20 NOTE — Procedures (Signed)
NAME:  [name], [name] NO.:  [id number]  MEDICAL RECORD NO.:  [id number]          PATIENT TYPE:  INP  LOCATION:  6531                         FACILITY:  [facility]  PHYSICIAN:  Veverly Fells. Excell Seltzer, MD  DATE OF BIRTH:  [birth year]  DATE OF PROCEDURE:  04/12/2010 DATE OF DISCHARGE:                           CARDIAC CATHETERIZATION   PROCEDURES: 1. Percutaneous transluminal coronary angioplasty and stenting of the     diagonal. 2. Intravascular ultrasound of the left anterior descending. 3. Angio-Seal of the left femoral artery.  PROCEDURAL INDICATION:  Ms. Rezek is an 77 year old woman who presented with non-ST-elevation infarction.  She underwent PCI of the right coronary artery on April 05, 2010, to treat a critical lesion in the distal RCA.  She had a 90% diagonal lesion and was referred today for staged PCI.  She has remained in the hospital because of renal insufficiency and she was adequately pre-hydrated before this procedure and also was treated with sodium bicarb protocol to maintain her renal function.  The risks and indications of the procedure were reviewed with the patient, informed consent was obtained.  Left groin was prepped, draped, and anesthetized with 1% lidocaine.  Using modified Seldinger technique, a 6-French sheath was placed in the left femoral artery.  I could not pass a J-wire through her iliac artery, so a Versacore wire was used to successfully access her aorta and an XB LAD 3.5-cm guide catheter was inserted.  Angiomax was used for anticoagulation.  The patient has been treated with aspirin and Plavix throughout her hospital stay.  Initial angiography confirmed severe stenosis of the first diagonal branch.  The vessel was wired with a cougar guidewire and predilated with a 2.5 x 12- mm apex balloon, which was taken to 4 atmospheres on a single inflation. The vessel was then stented with a 2.5 x 12-mm PROMUS Element stent, which was  deployed at 12 atmospheres.  Multiple projections were imaged to make sure that the ostium of the diagonal was covered because there was disease just back to the ostium of the diagonal.  The stent was deployed at 12 atmospheres and appeared well expanded.  The stent was then postdilated with a 2.75 x 8-mm [place] Quantum apex balloon, which was taken to 14 atmospheres on 2 inflations.  There was a good angiographic result in the diagonal with 0% residual stenosis.  I was a little bit concerned about the LAD just beyond the diagonal and that we may have shifted plaque into that area, therefore the LAD was wired and intravascular ultrasound was performed to assess the bifurcation of the LAD diagonal.  The intravascular ultrasound confirmed full coverage of the diagonal ostium with the diagonal stent just back into the LAD lumen, but there was an adequate lumen area throughout with nonobstructive plaque.  The patient tolerated the entire procedure well. Final angiography demonstrated a good result with 0% residual stenosis and TIMI 3 flow.  An Angio-Seal device was used for femoral hemostasis.  FINAL CONCLUSIONS:  Successful percutaneous intervention of the first diagonal branch using a drug-eluting stent.  RECOMMENDATIONS:  The patient will remain on dual-antiplatelet  therapy for least 12 months and further therapy per Dr. Deborah Chalk.     Veverly Fells. Excell Seltzer, MD     MDC/MEDQ  D:  04/12/2010  T:  04/13/2010  Job:  [id number]  cc:   Colleen Can. Deborah Chalk, M.D.  Electronically Signed by Tonny Bollman MD on 04/20/2010 04:54:57 AM

## 2010-04-21 ENCOUNTER — Other Ambulatory Visit: Payer: Self-pay

## 2010-04-22 NOTE — Progress Notes (Signed)
Summary: Cardiology Phone Note - HTN  Phone Note Call from Patient   Caller: Patient Summary of Call: Pt called to notify us that she is having indigestion/headache, and her blood pressure is 203/103. She was in the process of taking one nitroglycerine. She went to try to read her medicine list but her vision became blurry. Instructed pt to hang up and call 911, she expressed understanding. Initial call taken by: Ronie Spies NP-PA,  April 03, 2010 10:42 AM

## 2010-04-27 ENCOUNTER — Ambulatory Visit: Payer: Self-pay | Admitting: *Deleted

## 2010-04-28 ENCOUNTER — Ambulatory Visit: Payer: Self-pay | Admitting: *Deleted

## 2010-04-29 ENCOUNTER — Ambulatory Visit (INDEPENDENT_AMBULATORY_CARE_PROVIDER_SITE_OTHER): Payer: PRIVATE HEALTH INSURANCE | Admitting: *Deleted

## 2010-04-29 DIAGNOSIS — R110 Nausea: Secondary | ICD-10-CM

## 2010-04-29 DIAGNOSIS — D649 Anemia, unspecified: Secondary | ICD-10-CM

## 2010-05-05 ENCOUNTER — Telehealth: Payer: Self-pay | Admitting: Cardiology

## 2010-05-10 NOTE — Discharge Summary (Signed)
NAME:  Ashley, Walters NO.:  [id number]  MEDICAL RECORD NO.:  [id number]          PATIENT TYPE:  INP  LOCATION:  2028                         FACILITY:  [facility]  PHYSICIAN:  Duke Salvia, MD, FACCDATE OF BIRTH:  June 05, 1927  DATE OF ADMISSION:  04/15/2010 DATE OF DISCHARGE:  04/17/2010                              DISCHARGE SUMMARY   PROCEDURES:  Portable chest x-ray.  PRIMARY FINAL DISCHARGE DIAGNOSIS:  Possible gastritis with nausea, vomiting, and diarrhea.  SECONDARY DIAGNOSES: 1. Non-ST segment elevation myocardial infarction, discharged on     April 13, 2010, with bare-metal stent to the right coronary     artery and staged intervention to the diagonal with a drug-eluting     stent. 2. History of preserved left ventricular function with an ejection     fraction of 55-60% by echocardiogram in March 2011. 3. Diabetes. 4. Chronic diastolic congestive heart failure. 5. Hypertension.6. Hyperlipidemia. 7. History of bilateral carotid bruits. 8. Gout. 9. Obesity. 10.History of transient ischemic attacks. 11.Chronic kidney disease, stage III. 12.Status post stent to the right coronary artery in 1996. 13.History of ischemic cardiomyopathy with an ejection fraction     previously of 35%.  TIME OF DISCHARGE:  31 minutes.  HOSPITAL COURSE:  [name] is an 77 year old female with a history of coronary artery disease.  She was discharged home on April 13, 2010, after being hospitalized 10 days with an MI.  After she was discharged, she had watery diarrhea as well as nausea and vomiting.  She also developed some chest burning.  She went to [facility] and was transferred to [facility] for further evaluation and treatment.  She was hydrated with IV fluids and given Zofran.  Her symptoms slowly resolved.  Her cardiac enzymes were cycled and were negative.  She had mild renal insufficiency on admission with a creatinine that had gone from 1.06 to 1.45 and  a BUN that went from 29 to 34.  She was hydrated and by discharge her BUN was 21 with a creatinine of 1.27.  Her condition had substantially improved.  A UA was negative and her white count was not elevated.  It was slightly low at 3.5, but the absolute neutrophils, lymphocytes, and monocytes were within normal limits.  She was mildly anemic with a hemoglobin of 9.3.  Her hemoglobin was between 8.8 and 11 during her previous hospital stay.  She had been 10.5 on admission to the hospital.  Iron profile was not performed.  She will be discharged home with stool guaiac cards and follow up with primary care. A chest x-ray showed cardiomegaly without acute disease.  On April 17, 2010, her nausea, vomiting, and diarrhea were resolved. She had been hyperchloremic with a chloride level that ranged from 114 to 118 during her admission and at discharge it was 116.  She was noted to be hyperchloremic at times during her previous hospital stay and at one point, her CO2 dropped as low as 18, but is currently within normallimits at 19.  She is to get a recheck of her bicarb as an outpatient. During her previous admission,  hemoglobin A1c was elevated at 7.6, and her blood sugars were elevated as well.  She will be considered a borderline diabetic and be given information on a diabetic diet, to follow up with her family physician.  On April 17, 2010, [name] was ambulating and much improved.  She is considered stable for discharge in improved condition, to follow up as an outpatient.  DISCHARGE INSTRUCTIONS: 1. Her activity level is to be increased gradually. 2. She is not to do any lifting for 2 weeks. 3. She is encouraged to stick to a low-sodium heart-healthy diabetic     diet. 4. She is to follow up with Norma Fredrickson, nurse practitioner, on     April 27, 2010, at 11:00 a.m. as previously scheduled.  She is to     get a BMET at that office visit. 5. She is to follow up with Dr. Alberteen Sam  after completing the stool     guaiac cards.  DISCHARGE MEDICATIONS: 1. Diovan 160 mg daily. 2. Lovaza 4 capsules daily. 3. Lidoderm patch 1-3 patches daily as prior to admission. 4. Bystolic 20 mg daily. 5. WelChol 3 tablets b.i.d. 6. Vytorin 10/20 mg daily. 7. Amlodipine 10 mg daily. 8. Januvia 100 mg daily. 9. Sublingual nitroglycerin p.r.n. 10.Phenergan 25 mg 1/2 to 1 tablet q.4 h. p.r.n. 11.Pletal 100 mg b.i.d. 12.Plavix 75 mg daily. 13.Victoza 1.8 mg q.p.m. as prior to admission. 14.Lasix 80 mg daily is on hold. 15.Potassium 20 mEq daily is on hold. 16.Aspirin 325 mg daily.     Theodore Demark, PA-C   ______________________________ Duke Salvia, MD, [facility]    RB/MEDQ  D:  04/17/2010  T:  04/18/2010  Job:  [id number]  cc:   Birdena Jubilee, MD  Electronically Signed by Theodore Demark PA-C on 04/21/2010 03:38:03 PM Electronically Signed by Sherryl Manges MD [facility] on 05/10/2010 09:54:42 PM

## 2010-05-11 NOTE — Discharge Summary (Signed)
NAME:  Ashley, Walters NO.:  [id number]  MEDICAL RECORD NO.:  [id number]          PATIENT TYPE:  INP  LOCATION:  6531                         FACILITY:  [facility]  PHYSICIAN:  Colleen Can. Deborah Chalk, M.D.DATE OF BIRTH:  1927/07/25  DATE OF ADMISSION:  04/03/2010 DATE OF DISCHARGE:  04/13/2010                              DISCHARGE SUMMARY   PRIMARY CARDIOLOGIST:  Colleen Can. Deborah Chalk, MD  PRIMARY CARE PROVIDER:  Birdena Jubilee, MD  DISCHARGE DIAGNOSIS:  Non-ST-segment elevation myocardial infarction.  SECONDARY DIAGNOSES: 1. Coronary artery disease, status post inferior myocardial infarction     and bare-metal stenting of the right coronary artery in 1996. 2. Acute-on-chronic stage III kidney disease. 3. Contrast-induced nephropathy. 4. Diabetes mellitus. 5. Chronic diastolic congestive heart failure. 6. Hypertension. 7. Hyperlipidemia. 8. Claudication. 9. History of bilateral carotid bruits. 10.Gout. 11.Obesity. 12.History of transient ischemic attacks.  ALLERGIES:  PENICILLIN and SULFA.  PROCEDURES: 1. Left heart cardiac catheterization performed on April 05, 2010,     revealing patent J and J stent in the proximal-to-mid RCA with a     new 90% distal stenosis.  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  There was 90% stenosis in the first diagonal.     The right coronary artery was successfully stented with 2.75 x 15-     mm Vision bare-metal stent. 2. Staged percutaneous interventions for the first diagonal with     placement of a 2.5 x 12-mm Promus Element Plus drug-eluting stent.  HISTORY OF PRESENT ILLNESS:  An 77 year old female with prior history of inferior MI in 60 with J and J bare-metal stenting of the mid-right coronary artery at that time.  The patient has done reasonably well over the years and was in usual state of health until the morning of April 03, 2010 when she woke up at 4 a.m. with indigestion, belching and  low- grade headache, which she identified as similar to prior angina.  She noted that she was hypertensive at home and after taking her morning medications, presented to [facility] ED where ECG showed no acute changes; however, troponin elevated to 0.16.  The patient was admitted for further evaluation and management of non-ST-elevation MI.  HOSPITAL COURSE:  The patient eventually peaked her CK at 95, MB at 3.1 and troponin-I at 0.16.  It was felt that she would require cardiac catheterization and this took place on April 05, 2010, revealing a new 90% stenosis in the distal right coronary artery.  Previously placed bare-metal stent at the mid RCA was less than 30% stenosis while she was also noted to have a 90% stenosis in the first diagonal.  The RCA was felt to be the culprit vessel and this was successfully stented with a 2.75 x 15-mm Vision bare-metal stent.  The diagonal stenosis was felt to be significant and amenable to PCI and it was felt that the patient would benefit from staged intervention.  Given renal insufficiency, this was scheduled for later in the week.  However; postcatheterization/PCI, she unfortunately bumped her creatinine to a peak of 1.54 on April 07, 2010.  As a result, stage intervention was put on hold and the patient was adequately hydrated over the weekend with improvement and stabilization of her renal function.  Her home doses of Lasix and ARB were placed on hold.  The patient was taken back to the Cath Lab on April 12, 2010 were she underwent successful PCI and stenting of the first diagonal with placement of a 2.5 x 12-mm Promus Element Plus drug-eluting stent.  The patient tolerated this procedure well and postprocedure, creatinine is 1.06.  She has been ambulating with cardiac rehab without difficulty and will be discharged home today in good condition.  It should be noted that her blood pressure has been elevated throughout the majority of  her admission.  We have added nebivolol and also have titrate her home dose of Norvasc.  We will plan to resume her Lasix and ARB at half of previous dose with plan for followup basic metabolic in 1 week and subsequent titration of these medications provided stability of renal function.  DISCHARGE LABS:  Hemoglobin 9.3, hematocrit 27.4, WBC 3.7, platelets 152.  Sodium 141, potassium 4.9, chloride 111, CO2 25, BUN 29, creatinine 1.06, glucose 150, calcium 8.4.  Hemoglobin A1c 7.6.  CK 107, MB 2.8, troponin-I 0.06.  BNP 144.  Total cholesterol 108, triglycerides 100, HDL 52, LDL 36.  TSH 2.297.  MRSA screen was negative.  DISPOSITION:  The patient will be discharged home today in good condition.  FOLLOWUP PLANS AND APPOINTMENTS:  The patient will have followup basic metabolic panel in [facility] on April 21, 2010 at 9 a.m. She will follow up with Norma Fredrickson, nurse practitioner at [facility] on April 27, 2010 at 11 a.m.  She will follow up with Dr. Birdena Jubilee as previously scheduled.  DISCHARGE MEDICATIONS: 1. Plavix 75 mg daily. 2. Nebivolol 20 mg daily. 3. Nitroglycerin 0.4 mg sublingual p.r.n. chest pain. 4. Amlodipine 10 mg daily. 5. Aspirin 325 mg daily. 6. Diovan 160 mg daily. 7. Furosemide 40 mg daily. 8. Klor-Con 20 mEq half a tablet daily. 9. Pletal 100 mg b.i.d. 10.Lovaza 1 g four capsules daily. 11.Vytorin 10/20 mg daily. 12.Victoza 1.8 mg subcu nightly. 13.WelChol 625 mg three tablets b.i.d.  OUTSTANDING LABORATORY STUDIES:  The patient requires a followup basic metabolic panel in 1 week.  DURATION OF DISCHARGE ENCOUNTER:  Sixty minutes including physician time.     Nicolasa Ducking, ANP   ______________________________ Colleen Can. Deborah Chalk, M.D.    CB/MEDQ  D:  04/13/2010  T:  04/13/2010  Job:  [id number]  cc:   Birdena Jubilee, MD  Electronically Signed by Nicolasa Ducking ANP on 04/19/2010 12:27:28 PM Electronically Signed  by Roger Shelter M.D. on 05/11/2010 09:11:29 AM

## 2010-05-11 NOTE — H&P (Addendum)
NAME:  REMEE, [name] NO.:  [id number]  MEDICAL RECORD NO.:  [id number]          PATIENT TYPE:  INP  LOCATION:  2028                         FACILITY:  [facility]  PHYSICIAN:  [name], M.D.DATE OF BIRTH:  August 30, 1927  DATE OF ADMISSION:  04/15/2010 DATE OF DISCHARGE:                             HISTORY & PHYSICAL   PRIMARY CARDIOLOGIST:  Ashley Can. Deborah Chalk, MD  PRIMARY CARE PROVIDER:  Evlyn Kanner, PA  CHIEF COMPLAINT:  Vomiting and diarrhea.  HISTORY OF PRESENT ILLNESS:  An 77 year old white female with a past medical history significant for coronary artery disease status post recent PCI x2 who presents as a transfer from [facility] for evaluation and management of vomiting and diarrhea.  Historically, she was just hospitalized from April 03, 2010, to April 13, 2010, with a small non-ST-segment elevation myocardial infarction.  She underwent cardiac catheterization which demonstrated high-grade stenosis in the right coronary artery and this was treated with a 2.75 x 15 Vision bare- metal stent.  She had a staged procedure on April 12, 2009, with a drug-eluting stent placement to the first diagonal.  This was done in a staged process because she had a history of mild chronic kidney disease. It should be noted that on the day of discharge on Tuesday, her renal function was normal with a BUN of 29 and a creatinine of 1.1.  She was discharged on Tuesday and did well and then on Wednesday she reports developing significant amount of watery diarrhea.  She reports that she went at least five times and it was pure water.  It was so much that she had to change her bedsheets.  Later that evening, she ate baked potato which she noted was "green."  Shortly thereafter, she woke up from sleep with multiple episodes of nonbloody, nonbilious emesis.  In the setting of her emesis, she had some mild burning in her chest. Because of these symptoms,  she came to [facility] for evaluation.  At [employer], she was given a liter of IV fluid and was given Zofran and was transferred to [facility] for further management.  Currently, she is chest pain free and is resting comfortably.  PAST MEDICAL HISTORY: 1. Coronary artery disease status post PCI x2 as listed above in     history of present illness. 2. Diabetes mellitus. 3. Diastolic heart failure with preserved ejection fraction. 4. Hypertension. 5. Hyperlipidemia. 6. History of bilateral carotid bruits. 7. Gout. 8. Obesity. 9. History of transient ischemic attacks.  SOCIAL HISTORY:  The patient lives at home with her daughter.  She denies any tobacco, alcohol, or drug use.  FAMILY HISTORY:  Not pertinent as the patient states that she was adopted.  MEDICATIONS:  Unchanged from her discharge medicines and they include: 1. Plavix 75 mg daily. 2. Systolic 20 mg daily. 3. Sublingual nitroglycerin as needed. 4. Norvasc 10 mg daily. 5. Aspirin 325 mg daily. 6. Diovan 160 mg daily. 7. Lasix 40 mg daily. 8. Potassium chloride 10 mEq daily. 9. Pletal 100 mg b.i.d. 10.Lovaza 1 g 4 capsules daily. 11.Vytorin 10/20 mg daily. 12.Victoza  1.8 mg subcu nightly. 13.WelChol 625 mg 2-3 tablets b.i.d.  ALLERGIES: 1. SULFA. 2. PENICILLIN.  REVIEW OF SYSTEMS:  Twelve systems are reviewed and are negative except as mentioned above in history of present illness.  PHYSICAL EXAMINATION:  VITAL SIGNS:  On admission to the emergency department revealed afebrile, blood pressure is 136/78, heart rate 103 and regular, respirations 18, oxygen saturation 93% on room air. GENERAL:  Well-developed, well-nourished female in no acute distress. HEENT:  Normocephalic, atraumatic.  Pupils equally round and reactive to light and accommodation.  Oropharynx reveals dry mucous membranes. NECK:  Supple.  There are bilateral carotid bruits.  No masses. CARDIAC:  Regular rate and rhythm with no  murmurs, rubs, or gallops. CHEST:  Clear to auscultation bilaterally. ABDOMEN:  Positive bowel sounds.  Mild tenderness to palpation in right upper quadrant.  No rebound or guarding. EXTREMITIES:  No clubbing, cyanosis, or edema.  Dorsalis pedis pulses 2+ bilaterally. SKIN:  No rashes. BACK:  No CVA tenderness. NEUROLOGIC:  Nonfocal. PSYCHIATRIC:  Normal affect.  PERTINENT DATA:  Labs show white blood count of 3.5, hemoglobin 9.3, platelets 171,000.  Sodium 143, potassium 5, chloride 114, bicarb 24, glucose 106, BUN 34, creatinine 1.5.  Normal liver function studies. Lipase 30, INR 1.09.  Troponin less than 0.05.  EKG shows sinus rhythm at 96 beats per minute with an incomplete left bundle-branch block, unchanged from previous.  Chest x-ray shows no acute cardiopulmonary disease.  IMPRESSION AND PLAN:  [name] is an 77 year old white female with past medical history of coronary artery disease status post recent percutaneous coronary intervention x2 who presents with vomiting and diarrhea consistent with viral gastroenteritis. 1. We will admit the patient to Dr. Duffy Rhody Walters's care. 2. Gastroenteritis.  I suspect this is viral gastroenteritis.     Fortunately, it seems like her symptoms are somewhat improving;     however, she does have evidence of acute renal insufficiency.  This     is likely a combination of prerenal azotemia from volume depletion     as well as recent contrast use for her cardiac catheterization and     possible contrast-induced neuropathy.  We will hold her Lasix as     she is volume deplete.  She had already received a liter of fluid.     We will give her normal saline at 100 mL per hour.  We will also     give as-needed Zofran and Imodium, was started on a regular diet     and advanced as tolerated.  We will check BMP in the morning to see     if her renal function has improved. 3. Coronary artery disease.  She is status post stenting x2 last week.      I suspect that her chest pain was related to her vomiting and     esophageal irritation.  It is reassuring that her first set of     cardiac enzymes are negative.  We will check one more set of     enzymes.  We will continue on her home regimen which includes     aspirin, Plavix, Bystolic, and statin therapy.  We will give her a     dose of aspirin and Plavix now as she does not think that she took     these medications today. 4. Diabetes mellitus.  We will continue her home Victoza and place her     on sliding scale insulin. 5. Hypertension.  Appears to be well  controlled.  Continue home     medications. 6. Hyperlipidemia.  Continue Vytorin. 7. Fluids/nutrition.  We will be volume resuscitating the patient with     normal saline at 100 mL per hour.  Electrolytes are stable.     Regular diet and advance as tolerated. 8. Deep vein thrombosis prophylaxis with subcutaneous heparin.     Therisa Doyne, MD   ______________________________ [name], M.D.    SJT/MEDQ  D:  04/15/2010  T:  04/15/2010  Job:  [id number]  Electronically Signed by Roger Shelter M.D. on 05/11/2010 09:11:37 AM Electronically Signed by Aldona Bar MD on 05/20/2010 08:33:27 PM

## 2010-05-12 NOTE — Progress Notes (Signed)
Summary: medication on back order- tekturna  Phone Note From Pharmacy Call back at [facility] [phone]   Caller: [phone]- physician pharmacy Request: Speak with Nurse Summary of Call: medication on back order-.tekturna.  suggestion pharmacy is offfering- tekamlo 300/10 mg combintion on norvasc. would require prior auth.  Initial call taken by: Lorne Skeens,  May 05, 2010 8:48 AM  Follow-up for Phone Call        This is Dr Ronnald Nian pt.  I spoke with [facility] the pharmacist and gave him the contact information for Dr Ronnald Nian office.  Follow-up by: Julieta Gutting, RN, BSN,  May 05, 2010 8:58 AM

## 2010-05-18 ENCOUNTER — Ambulatory Visit (INDEPENDENT_AMBULATORY_CARE_PROVIDER_SITE_OTHER): Payer: PRIVATE HEALTH INSURANCE | Admitting: Cardiology

## 2010-05-18 DIAGNOSIS — I2510 Atherosclerotic heart disease of native coronary artery without angina pectoris: Secondary | ICD-10-CM

## 2010-05-31 ENCOUNTER — Other Ambulatory Visit: Payer: Self-pay

## 2010-05-31 ENCOUNTER — Encounter (HOSPITAL_COMMUNITY): Payer: PRIVATE HEALTH INSURANCE | Attending: Cardiology

## 2010-05-31 DIAGNOSIS — Z7982 Long term (current) use of aspirin: Secondary | ICD-10-CM | POA: Insufficient documentation

## 2010-05-31 DIAGNOSIS — Z8673 Personal history of transient ischemic attack (TIA), and cerebral infarction without residual deficits: Secondary | ICD-10-CM | POA: Insufficient documentation

## 2010-05-31 DIAGNOSIS — I509 Heart failure, unspecified: Secondary | ICD-10-CM | POA: Insufficient documentation

## 2010-05-31 DIAGNOSIS — Z5189 Encounter for other specified aftercare: Secondary | ICD-10-CM | POA: Insufficient documentation

## 2010-05-31 DIAGNOSIS — Z7902 Long term (current) use of antithrombotics/antiplatelets: Secondary | ICD-10-CM | POA: Insufficient documentation

## 2010-05-31 DIAGNOSIS — I2510 Atherosclerotic heart disease of native coronary artery without angina pectoris: Secondary | ICD-10-CM | POA: Insufficient documentation

## 2010-05-31 DIAGNOSIS — I252 Old myocardial infarction: Secondary | ICD-10-CM | POA: Insufficient documentation

## 2010-05-31 DIAGNOSIS — I5032 Chronic diastolic (congestive) heart failure: Secondary | ICD-10-CM | POA: Diagnosis not present

## 2010-05-31 DIAGNOSIS — E785 Hyperlipidemia, unspecified: Secondary | ICD-10-CM | POA: Insufficient documentation

## 2010-05-31 DIAGNOSIS — E119 Type 2 diabetes mellitus without complications: Secondary | ICD-10-CM | POA: Insufficient documentation

## 2010-05-31 DIAGNOSIS — I2589 Other forms of chronic ischemic heart disease: Secondary | ICD-10-CM | POA: Insufficient documentation

## 2010-05-31 DIAGNOSIS — N183 Chronic kidney disease, stage 3 unspecified: Secondary | ICD-10-CM | POA: Insufficient documentation

## 2010-05-31 DIAGNOSIS — I129 Hypertensive chronic kidney disease with stage 1 through stage 4 chronic kidney disease, or unspecified chronic kidney disease: Secondary | ICD-10-CM | POA: Insufficient documentation

## 2010-06-01 LAB — GLUCOSE, CAPILLARY: Glucose-Capillary: 119 mg/dL — ABNORMAL HIGH (ref 70–99)

## 2010-06-02 ENCOUNTER — Other Ambulatory Visit: Payer: Self-pay

## 2010-06-02 ENCOUNTER — Encounter (HOSPITAL_COMMUNITY): Payer: PRIVATE HEALTH INSURANCE

## 2010-06-02 DIAGNOSIS — Z5189 Encounter for other specified aftercare: Secondary | ICD-10-CM | POA: Diagnosis not present

## 2010-06-02 LAB — GLUCOSE, CAPILLARY: Glucose-Capillary: 155 mg/dL — ABNORMAL HIGH (ref 70–99)

## 2010-06-04 ENCOUNTER — Other Ambulatory Visit: Payer: Self-pay

## 2010-06-04 ENCOUNTER — Encounter (HOSPITAL_COMMUNITY): Payer: PRIVATE HEALTH INSURANCE

## 2010-06-04 DIAGNOSIS — Z5189 Encounter for other specified aftercare: Secondary | ICD-10-CM | POA: Diagnosis not present

## 2010-06-07 ENCOUNTER — Encounter (HOSPITAL_COMMUNITY): Payer: PRIVATE HEALTH INSURANCE

## 2010-06-07 DIAGNOSIS — Z5189 Encounter for other specified aftercare: Secondary | ICD-10-CM | POA: Diagnosis not present

## 2010-06-07 LAB — COMPREHENSIVE METABOLIC PANEL
ALT: 15 U/L (ref 0–35)
AST: 18 U/L (ref 0–37)
CO2: 21 mEq/L (ref 19–32)
Chloride: 113 mEq/L — ABNORMAL HIGH (ref 96–112)
Creatinine, Ser: 1.7 mg/dL — ABNORMAL HIGH (ref 0.4–1.2)
GFR calc Af Amer: 35 mL/min — ABNORMAL LOW (ref >60)
GFR calc non Af Amer: 29 mL/min — ABNORMAL LOW (ref >60)
Total Bilirubin: 0.4 mg/dL (ref 0.3–1.2)

## 2010-06-07 LAB — URINALYSIS, ROUTINE W REFLEX MICROSCOPIC
Bilirubin Urine: NEGATIVE
Glucose, UA: NEGATIVE mg/dL
Ketones, ur: NEGATIVE mg/dL
pH: 7 (ref 5.0–8.0)

## 2010-06-07 LAB — GLUCOSE, CAPILLARY
Glucose-Capillary: 102 mg/dL — ABNORMAL HIGH (ref 70–99)
Glucose-Capillary: 109 mg/dL — ABNORMAL HIGH (ref 70–99)
Glucose-Capillary: 123 mg/dL — ABNORMAL HIGH (ref 70–99)
Glucose-Capillary: 191 mg/dL — ABNORMAL HIGH (ref 70–99)

## 2010-06-07 LAB — DIFFERENTIAL
Basophils Absolute: 0 10*3/uL (ref 0.0–0.1)
Eosinophils Absolute: 0.1 10*3/uL (ref 0.0–0.7)
Eosinophils Relative: 1 % (ref 0–5)
Lymphocytes Relative: 29 % (ref 12–46)

## 2010-06-07 LAB — CBC
HCT: 34.2 % — ABNORMAL LOW (ref 36.0–46.0)
Hemoglobin: 11.8 g/dL — ABNORMAL LOW (ref 12.0–15.0)
MCHC: 34.6 g/dL (ref 30.0–36.0)
RBC: 3.7 MIL/uL — ABNORMAL LOW (ref 3.87–5.11)
RDW: 13.4 % (ref 11.5–15.5)

## 2010-06-07 LAB — CULTURE, BLOOD (ROUTINE X 2): Culture: NO GROWTH

## 2010-06-07 LAB — LIPID PANEL
Cholesterol: 118 mg/dL (ref 0–200)
LDL Cholesterol: 34 mg/dL (ref 0–99)
VLDL: 23 mg/dL (ref 0–40)

## 2010-06-07 LAB — URINE CULTURE: Culture: NO GROWTH

## 2010-06-07 LAB — HEMOGLOBIN A1C: Mean Plasma Glucose: 157 mg/dL — ABNORMAL HIGH (ref <117)

## 2010-06-07 LAB — PROTIME-INR: Prothrombin Time: 13.8 seconds (ref 11.6–15.2)

## 2010-06-08 LAB — GLUCOSE, CAPILLARY
Glucose-Capillary: 149 mg/dL — ABNORMAL HIGH (ref 70–99)
Glucose-Capillary: 160 mg/dL — ABNORMAL HIGH (ref 70–99)
Glucose-Capillary: 167 mg/dL — ABNORMAL HIGH (ref 70–99)
Glucose-Capillary: 180 mg/dL — ABNORMAL HIGH (ref 70–99)
Glucose-Capillary: 165 mg/dL — ABNORMAL HIGH (ref 70–99)
Glucose-Capillary: 173 mg/dL — ABNORMAL HIGH (ref 70–99)
Glucose-Capillary: 198 mg/dL — ABNORMAL HIGH (ref 70–99)
Glucose-Capillary: 200 mg/dL — ABNORMAL HIGH (ref 70–99)
Glucose-Capillary: 206 mg/dL — ABNORMAL HIGH (ref 70–99)
Glucose-Capillary: 222 mg/dL — ABNORMAL HIGH (ref 70–99)
Glucose-Capillary: 232 mg/dL — ABNORMAL HIGH (ref 70–99)

## 2010-06-09 ENCOUNTER — Encounter (HOSPITAL_COMMUNITY): Payer: PRIVATE HEALTH INSURANCE

## 2010-06-09 DIAGNOSIS — Z5189 Encounter for other specified aftercare: Secondary | ICD-10-CM | POA: Diagnosis not present

## 2010-06-09 LAB — GLUCOSE, CAPILLARY
Glucose-Capillary: 142 mg/dL — ABNORMAL HIGH (ref 70–99)
Glucose-Capillary: 146 mg/dL — ABNORMAL HIGH (ref 70–99)
Glucose-Capillary: 152 mg/dL — ABNORMAL HIGH (ref 70–99)
Glucose-Capillary: 166 mg/dL — ABNORMAL HIGH (ref 70–99)
Glucose-Capillary: 169 mg/dL — ABNORMAL HIGH (ref 70–99)
Glucose-Capillary: 175 mg/dL — ABNORMAL HIGH (ref 70–99)
Glucose-Capillary: 179 mg/dL — ABNORMAL HIGH (ref 70–99)
Glucose-Capillary: 185 mg/dL — ABNORMAL HIGH (ref 70–99)
Glucose-Capillary: 211 mg/dL — ABNORMAL HIGH (ref 70–99)

## 2010-06-11 ENCOUNTER — Encounter (HOSPITAL_COMMUNITY): Payer: PRIVATE HEALTH INSURANCE

## 2010-06-11 DIAGNOSIS — Z5189 Encounter for other specified aftercare: Secondary | ICD-10-CM | POA: Diagnosis not present

## 2010-06-11 LAB — GLUCOSE, CAPILLARY: Glucose-Capillary: 136 mg/dL — ABNORMAL HIGH (ref 70–99)

## 2010-06-13 LAB — CREATININE, SERUM: GFR calc Af Amer: 41 mL/min — ABNORMAL LOW (ref >60)

## 2010-06-14 ENCOUNTER — Encounter (HOSPITAL_COMMUNITY): Payer: PRIVATE HEALTH INSURANCE | Attending: Cardiology

## 2010-06-14 DIAGNOSIS — Z5189 Encounter for other specified aftercare: Secondary | ICD-10-CM | POA: Diagnosis not present

## 2010-06-14 DIAGNOSIS — I2589 Other forms of chronic ischemic heart disease: Secondary | ICD-10-CM | POA: Insufficient documentation

## 2010-06-14 DIAGNOSIS — I129 Hypertensive chronic kidney disease with stage 1 through stage 4 chronic kidney disease, or unspecified chronic kidney disease: Secondary | ICD-10-CM | POA: Insufficient documentation

## 2010-06-14 DIAGNOSIS — N183 Chronic kidney disease, stage 3 unspecified: Secondary | ICD-10-CM | POA: Insufficient documentation

## 2010-06-14 DIAGNOSIS — Z8673 Personal history of transient ischemic attack (TIA), and cerebral infarction without residual deficits: Secondary | ICD-10-CM | POA: Insufficient documentation

## 2010-06-14 DIAGNOSIS — I5032 Chronic diastolic (congestive) heart failure: Secondary | ICD-10-CM | POA: Insufficient documentation

## 2010-06-14 DIAGNOSIS — Z7902 Long term (current) use of antithrombotics/antiplatelets: Secondary | ICD-10-CM | POA: Insufficient documentation

## 2010-06-14 DIAGNOSIS — I252 Old myocardial infarction: Secondary | ICD-10-CM | POA: Insufficient documentation

## 2010-06-14 DIAGNOSIS — E785 Hyperlipidemia, unspecified: Secondary | ICD-10-CM | POA: Insufficient documentation

## 2010-06-14 DIAGNOSIS — I2510 Atherosclerotic heart disease of native coronary artery without angina pectoris: Secondary | ICD-10-CM | POA: Insufficient documentation

## 2010-06-14 DIAGNOSIS — E119 Type 2 diabetes mellitus without complications: Secondary | ICD-10-CM | POA: Insufficient documentation

## 2010-06-14 DIAGNOSIS — I509 Heart failure, unspecified: Secondary | ICD-10-CM | POA: Insufficient documentation

## 2010-06-14 DIAGNOSIS — Z7982 Long term (current) use of aspirin: Secondary | ICD-10-CM | POA: Insufficient documentation

## 2010-06-14 LAB — GLUCOSE, CAPILLARY
Glucose-Capillary: 163 mg/dL — ABNORMAL HIGH (ref 70–99)
Glucose-Capillary: 180 mg/dL — ABNORMAL HIGH (ref 70–99)
Glucose-Capillary: 199 mg/dL — ABNORMAL HIGH (ref 70–99)
Glucose-Capillary: 328 mg/dL — ABNORMAL HIGH (ref 70–99)
Glucose-Capillary: 443 mg/dL — ABNORMAL HIGH (ref 70–99)

## 2010-06-16 ENCOUNTER — Encounter (HOSPITAL_COMMUNITY): Payer: PRIVATE HEALTH INSURANCE

## 2010-06-16 ENCOUNTER — Other Ambulatory Visit: Payer: PRIVATE HEALTH INSURANCE | Admitting: *Deleted

## 2010-06-16 DIAGNOSIS — Z5189 Encounter for other specified aftercare: Secondary | ICD-10-CM | POA: Diagnosis not present

## 2010-06-17 ENCOUNTER — Other Ambulatory Visit: Payer: PRIVATE HEALTH INSURANCE | Admitting: *Deleted

## 2010-06-18 ENCOUNTER — Other Ambulatory Visit (INDEPENDENT_AMBULATORY_CARE_PROVIDER_SITE_OTHER): Payer: PRIVATE HEALTH INSURANCE | Admitting: *Deleted

## 2010-06-18 ENCOUNTER — Encounter (HOSPITAL_COMMUNITY): Payer: PRIVATE HEALTH INSURANCE

## 2010-06-18 ENCOUNTER — Other Ambulatory Visit: Payer: PRIVATE HEALTH INSURANCE | Admitting: *Deleted

## 2010-06-18 DIAGNOSIS — Z5189 Encounter for other specified aftercare: Secondary | ICD-10-CM | POA: Diagnosis not present

## 2010-06-18 DIAGNOSIS — D509 Iron deficiency anemia, unspecified: Secondary | ICD-10-CM

## 2010-06-18 LAB — CBC WITH DIFFERENTIAL/PLATELET
Basophils Relative: 0.4 % (ref 0.0–3.0)
Eosinophils Relative: 3.1 % (ref 0.0–5.0)
HCT: 27.4 % — ABNORMAL LOW (ref 36.0–46.0)
Hemoglobin: 9.6 g/dL — ABNORMAL LOW (ref 12.0–15.0)
Lymphs Abs: 1.2 10*3/uL (ref 0.7–4.0)
MCV: 90.9 fl (ref 78.0–100.0)
Monocytes Absolute: 0.4 10*3/uL (ref 0.1–1.0)
Monocytes Relative: 9.8 % (ref 3.0–12.0)
RBC: 3.01 Mil/uL — ABNORMAL LOW (ref 3.87–5.11)
WBC: 3.9 10*3/uL — ABNORMAL LOW (ref 4.5–10.5)

## 2010-06-21 ENCOUNTER — Encounter (HOSPITAL_COMMUNITY): Payer: PRIVATE HEALTH INSURANCE

## 2010-06-23 ENCOUNTER — Encounter (HOSPITAL_COMMUNITY): Payer: PRIVATE HEALTH INSURANCE

## 2010-06-25 ENCOUNTER — Encounter (HOSPITAL_COMMUNITY): Payer: PRIVATE HEALTH INSURANCE

## 2010-06-28 ENCOUNTER — Encounter (HOSPITAL_COMMUNITY): Payer: PRIVATE HEALTH INSURANCE

## 2010-06-29 ENCOUNTER — Telehealth: Payer: Self-pay | Admitting: Cardiology

## 2010-06-29 NOTE — Telephone Encounter (Signed)
[name], I think she is going to need to be seen to do this. Let me know. I don't think her medicines have ever been right. She needs to bring in all of her actual bottles.

## 2010-06-29 NOTE — Telephone Encounter (Signed)
Needs help trying to adjust her blood pressure medications. Blood pressure dropped below 100 when on medications and was Hypoglycemic. Stopped taking medications and blood pressure returned to normal and then began to rise. Please call. Norvasc 10mg , Diovan 160mg , Bystolic 20mg 

## 2010-06-30 ENCOUNTER — Encounter (HOSPITAL_COMMUNITY): Payer: PRIVATE HEALTH INSURANCE

## 2010-06-30 NOTE — Telephone Encounter (Signed)
Pt c/o low BP 99/46 and feeling weak/nauseated. No CP or SOB.  Pt stopped Diovan 160 mg, Bystolic 20 mg and Norvasc 10 mg on her own.  Pt feels good since stopping those medications a few days age.  BP today 129/80.  Norma Fredrickson NP notified and instructed RN to have pt add back the medications that were stopped slowly 1/2 tab at a time and f/u in a month for office visit.  Pt notified of Lori's instructions and will monitor BP closely and call with any concerns.

## 2010-07-02 ENCOUNTER — Encounter (HOSPITAL_COMMUNITY): Payer: PRIVATE HEALTH INSURANCE

## 2010-07-05 ENCOUNTER — Encounter (HOSPITAL_COMMUNITY): Payer: PRIVATE HEALTH INSURANCE

## 2010-07-07 ENCOUNTER — Encounter (HOSPITAL_COMMUNITY): Payer: PRIVATE HEALTH INSURANCE

## 2010-07-09 ENCOUNTER — Encounter (HOSPITAL_COMMUNITY): Payer: PRIVATE HEALTH INSURANCE

## 2010-07-12 ENCOUNTER — Encounter (HOSPITAL_COMMUNITY): Payer: PRIVATE HEALTH INSURANCE

## 2010-07-14 ENCOUNTER — Encounter (HOSPITAL_COMMUNITY): Payer: PRIVATE HEALTH INSURANCE

## 2010-07-16 ENCOUNTER — Encounter: Payer: Self-pay | Admitting: Nurse Practitioner

## 2010-07-16 ENCOUNTER — Encounter (HOSPITAL_COMMUNITY): Payer: PRIVATE HEALTH INSURANCE

## 2010-07-16 DIAGNOSIS — I2510 Atherosclerotic heart disease of native coronary artery without angina pectoris: Secondary | ICD-10-CM | POA: Insufficient documentation

## 2010-07-16 DIAGNOSIS — N189 Chronic kidney disease, unspecified: Secondary | ICD-10-CM | POA: Insufficient documentation

## 2010-07-16 DIAGNOSIS — G459 Transient cerebral ischemic attack, unspecified: Secondary | ICD-10-CM | POA: Insufficient documentation

## 2010-07-16 DIAGNOSIS — I5032 Chronic diastolic (congestive) heart failure: Secondary | ICD-10-CM | POA: Insufficient documentation

## 2010-07-16 DIAGNOSIS — I10 Essential (primary) hypertension: Secondary | ICD-10-CM | POA: Insufficient documentation

## 2010-07-16 DIAGNOSIS — R0989 Other specified symptoms and signs involving the circulatory and respiratory systems: Secondary | ICD-10-CM | POA: Insufficient documentation

## 2010-07-16 DIAGNOSIS — M109 Gout, unspecified: Secondary | ICD-10-CM | POA: Insufficient documentation

## 2010-07-16 DIAGNOSIS — E785 Hyperlipidemia, unspecified: Secondary | ICD-10-CM | POA: Insufficient documentation

## 2010-07-16 DIAGNOSIS — E669 Obesity, unspecified: Secondary | ICD-10-CM | POA: Insufficient documentation

## 2010-07-19 ENCOUNTER — Encounter (HOSPITAL_COMMUNITY): Payer: PRIVATE HEALTH INSURANCE

## 2010-07-21 ENCOUNTER — Encounter (HOSPITAL_COMMUNITY): Payer: PRIVATE HEALTH INSURANCE

## 2010-07-23 ENCOUNTER — Encounter: Payer: Self-pay | Admitting: Family Medicine

## 2010-07-23 ENCOUNTER — Encounter (HOSPITAL_COMMUNITY): Payer: PRIVATE HEALTH INSURANCE

## 2010-07-26 ENCOUNTER — Ambulatory Visit (INDEPENDENT_AMBULATORY_CARE_PROVIDER_SITE_OTHER): Payer: PRIVATE HEALTH INSURANCE | Admitting: Nurse Practitioner

## 2010-07-26 ENCOUNTER — Ambulatory Visit: Payer: PRIVATE HEALTH INSURANCE | Admitting: Nurse Practitioner

## 2010-07-26 ENCOUNTER — Encounter (HOSPITAL_COMMUNITY): Payer: PRIVATE HEALTH INSURANCE

## 2010-07-26 ENCOUNTER — Encounter: Payer: Self-pay | Admitting: Nurse Practitioner

## 2010-07-26 VITALS — BP 160/80 | HR 82 | Wt 145.0 lb

## 2010-07-26 DIAGNOSIS — I2510 Atherosclerotic heart disease of native coronary artery without angina pectoris: Secondary | ICD-10-CM

## 2010-07-26 DIAGNOSIS — E785 Hyperlipidemia, unspecified: Secondary | ICD-10-CM

## 2010-07-26 DIAGNOSIS — I10 Essential (primary) hypertension: Secondary | ICD-10-CM

## 2010-07-26 NOTE — Assessment & Plan Note (Signed)
She is on half dose of her Vytorin. Labs have been checked last week with her PCP. We will try to review those results.

## 2010-07-26 NOTE — Assessment & Plan Note (Signed)
She is not having chest pain. She is committed to her Plavix for one year. She doesn't want to switch to a different agent. I think she needs close follow up.

## 2010-07-26 NOTE — Assessment & Plan Note (Signed)
Blood pressure at home is running low. She feels better with stopping her cardiac meds. I have asked her to continue to monitor her blood pressure closely at home. If she is above 140/85 she can take a dose of her Norvasc or her Diovan. I will see her back in 2 months. She does not wish to see Dr. Excell Seltzer for the next visit with Dr. Deborah Chalk retiring next month.

## 2010-07-26 NOTE — Progress Notes (Signed)
[name] Date of Birth: 1927-06-17   History of Present Illness: [name] is seen today for a follow up visit. She is seen for Dr. Deborah Walters. She has had 2 vessel PCI back in January. She is committed to her Plavix for at least one year. She has had considerable GI distress. She has basically not been able to eat solid foods since the PCI. She is staying hydrated and able to ingest full liquids. She has lost over 20 pounds since her last visit. She is not taking her Bystolic, Diovan, Norvasc or Pletal. She is no longer taking Valturna or Coreg as well. She is only using 1/2 dose Vytorin. Her blood pressure had been running very low with systolic readings in the 80 to 90's. It is now improved. She does not wish to switch over to a different platelet inhibitor.   Current Outpatient Prescriptions on File Prior to Visit  Medication Sig Dispense Refill  . aspirin 81 MG tablet Take 81 mg by mouth daily.        . clopidogrel (PLAVIX) 75 MG tablet Take 75 mg by mouth daily.        . colesevelam (WELCHOL) 625 MG tablet Take 1,875 mg by mouth 2 (two) times daily.       Marland Kitchen ezetimibe-simvastatin (VYTORIN) 10-20 MG per tablet Take 1 tablet by mouth at bedtime.        . Liraglutide 18 MG/3ML SOLN Inject 1.2 mg into the skin daily.        Marland Kitchen omega-3 acid ethyl esters (LOVAZA) 1 G capsule Take 1 g by mouth 2 (two) times daily.       . pantoprazole (PROTONIX) 40 MG tablet Take 40 mg by mouth daily.        Marland Kitchen DISCONTD: Aliskiren-Valsartan (VALTURNA) 300-320 MG TABS Take by mouth at bedtime.        Marland Kitchen DISCONTD: amLODipine (NORVASC) 10 MG tablet Take 10 mg by mouth daily.        Marland Kitchen DISCONTD: carvedilol (COREG) 25 MG tablet Take 25 mg by mouth 2 (two) times daily with a meal.        . DISCONTD: valsartan (DIOVAN) 320 MG tablet Take 320 mg by mouth daily.          Allergies  Allergen Reactions  . Penicillins   . Sulfa Antibiotics     Past Medical History  Diagnosis Date  . Coronary artery disease     Remote  PCI. Had BMS to RCA and DES stent to Diagonal in Jan. 2012   . Diabetes mellitus   . Diastolic heart failure     EF 55 to 60% per cath in Jan 2012  . Hyperlipidemia   . Hypertension   . Carotid bruit   . Gout   . Obesity   . TIA (transient ischemic attack)   . Chronic kidney disease     Stage 3  . Ischemic cardiomyopathy     with prior EF of 35%  . NSTEMI (non-ST elevated myocardial infarction) Jan 2012    History reviewed. No pertinent past surgical history.  History  Smoking status  . Former Smoker  . Quit date: 03/21/1968  Smokeless tobacco  . Never Used    History  Alcohol Use No    History reviewed. No pertinent family history.  Review of Systems: The review of systems is positive for GI upset and weight loss.  Her readings from home are reviewed and run on the low side for  the most part. She does have spells of weakness. No syncope. She has had recent labs drawn just last week. Hemoglobin is 11.1 per labs on 07/20/10. All other systems were reviewed and are negative.  Physical Exam: BP 160/80  Pulse 82  Wt 145 lb (65.772 kg) Patient is very pleasant and in no acute distress. She looks younger than her stated age. Skin is warm and dry. Color is normal.  HEENT is unremarkable. Normocephalic/atraumatic. PERRL. Sclera are nonicteric. Neck is supple. No masses. No JVD. Lungs are clear. Cardiac exam shows a regular rate and rhythm. Abdomen is soft. Extremities are without edema. Gait and ROM are intact. No gross neurologic deficits noted.  LABORATORY DATA:  N/A   Assessment / Plan:

## 2010-07-26 NOTE — Patient Instructions (Addendum)
Keep taking your current meds as you are.  Ok to not take the Diovan, Norvasc, Bystolic and Pletal for now I will get a copy of your labs from Dr. Alberteen Sam. I will see you back in 2 months. May take either the Diovan or Norvasc if your blood pressure is above 140/80

## 2010-07-28 ENCOUNTER — Encounter (HOSPITAL_COMMUNITY): Payer: PRIVATE HEALTH INSURANCE

## 2010-07-30 ENCOUNTER — Encounter (HOSPITAL_COMMUNITY): Payer: PRIVATE HEALTH INSURANCE

## 2010-08-02 ENCOUNTER — Encounter (HOSPITAL_COMMUNITY): Payer: PRIVATE HEALTH INSURANCE

## 2010-08-03 NOTE — Discharge Summary (Signed)
NAME:  LISSA, ROWLES NO.:  [id number]   MEDICAL RECORD NO.:  [id number]          PATIENT TYPE:  INP   LOCATION:  2005                         FACILITY:  [facility]   PHYSICIAN:  Colleen Can. Deborah Chalk, M.D.DATE OF BIRTH:  Sep 23, 1927   DATE OF ADMISSION:  03/10/2008  DATE OF DISCHARGE:  03/12/2008                               DISCHARGE SUMMARY   DISCHARGE DIAGNOSES:  1. Left leg pain felt to be secondary to Achilles tendinitis.  2. Known coronary artery disease with remote inferior wall myocardial      infarction in 1996 with previous stenting of the right coronary      artery.  She has nonobstructive disease elsewhere.  3. History of congestive heart failure with a reported ejection      fraction of 35% per Myoview in 2000.  4. Ischemic cardiomyopathy.  5. Hypertension.  6. Hyperlipidemia.  7. Diabetes.  8. Recent upper respiratory infection.  9. Remote bilateral ankle fractures.  10.History of anemia.  11.History of renal insufficiency.   HISTORY OF PRESENT ILLNESS:  The patient is a 77 year old female who has  multiple medical problems.  She presented to the emergency room with my  left hurts.  She had been lying around in bed for the last several days  because of URI, which has been treated with antibiotics.  The discomfort  and pain became quite unbearable and she came to the emergency room for  further evaluation.  Her D-dimer was only slightly elevated at 0.73.  She was noted to be markedly hypertensive.  She was subsequently seen  and admitted for further evaluation.   Please see the history and physical for further patient's presentation  and profile.   LABORATORY DATA:  White count 4.5, hemoglobin 10.7, and platelets 188.  INR was 1.2.  Sodium 143, potassium 4.1, chloride 112, CO2 of 27, BUN  13, creatinine 0.8, and glucose of 143.   EKG showed sinus rhythm with left bundle-branch block, which is old.   HOSPITAL COURSE:  The patient was admitted.   She was initially started  on IV heparin until Doppler studies were able to be obtained.  She was  treated with narcotics for left leg pain.  When she was seen on rounds  the following morning, she stated she had been throwing up multiple  times.  She had spit up some frank blood.  The heparin was subsequently  discontinued.  Doppler study was carried out, which was negative for  DVT.  We subsequently reviewed the old medical record and she had had an  episode of identical symptoms 2 months ago, which she saw Orthopedics on  an outpatient basis and was felt to be having Achilles tendinitis.  We  subsequently started the patient on Voltaren gel.  Plain x-rays of the  left leg were unremarkable.  Her activity was increased and by March 12, 2008, she is doing well without really much complaint.  Her blood  pressure has been treated with the addition of Norvasc and it has come  down quite nicely and today she is felt to be  a stable candidate for  discharge.   DISCHARGE CONDITION:  Stable.   DISCHARGE DIET:  Low-salt heart-healthy.   ACTIVITY:  Not restricted   We will ask her to keep a regular appointment with Dr. Deborah Chalk.  We will  have her see Dr. Birdena Jubilee approximately 2 weeks for followup.   DISCHARGE MEDICINES:  1. Aspirin 81 mg a day.  2. Quinapril 40 mg a day.  3. Glipizide 10 mg each morning.  4. Actos 15 mg each morning.  5. Vytorin 10/20 daily.  6. Januvia 50 mg a day.  7. WelChol 3 tablets 2 times a day.  8. Lovaza 4 g a day.  9. Coreg 25 b.i.d.  10.Lasix 80 mg a day.  11.MiraLax daily.  12.We have added Norvasc 5 mg a day.  13.Voltaren gel 1% to be applied to the affected left leg 4 times a      day as needed.   She is to call if any problems arise in the interim.  Otherwise, we will  defer management back to primary care.      Sharlee Blew, N.P.      Colleen Can. Deborah Chalk, M.D.  Electronically Signed    LC/MEDQ  D:  03/12/2008  T:  03/12/2008   Job:  [id number]   cc:   Birdena Jubilee, MD

## 2010-08-03 NOTE — H&P (Signed)
NAME:  [name], [name] NO.:  [id number]   MEDICAL RECORD NO.:  [id number]          PATIENT TYPE:  INP   LOCATION:  2005                         FACILITY:  [facility]   PHYSICIAN:  Bevelyn Buckles. Bensimhon, MDDATE OF BIRTH:  1927/11/14   DATE OF ADMISSION:  03/10/2008  DATE OF DISCHARGE:                              HISTORY & PHYSICAL   PRIMARY CARE PHYSICIAN:  Dr. Birdena Jubilee in [facility].   CARDIOLOGIST:  Dr. Delfin Edis.   CHIEF COMPLAINT:  My leg hurts.   HISTORY OF PRESENT ILLNESS:  [name] is a 77 year old woman with  multiple medical problems including coronary artery disease status post  previous inferior wall myocardial infarction in 1996 with stent  placement to the RCA.  She also has an ischemic cardiomyopathy with  ejection fraction of 35% per Myoview in 2000.  Remainder of medical  history is notable for hypertension, hyperlipidemia and diabetes.   She notes that she has been sick with upper respiratory tract infection  symptoms for the past 4 days.  She is basically in bed the whole time.  Over the past 2-3 days, she has noticed pain and swelling in her left  lower extremity.  Initially this was near her ankle but now she says it  is behind her calf.  The pain has become progressive and tonight it  became unbearable.  She came to the ER for further evaluation.  She  denies any chest pain, shortness of breath or trauma.   On arrival to the emergency room, blood pressure was 215/76.  She was  given IV Dilaudid with improvement in her pain but not total resolution.  Her D-dimer is 0.73.   REVIEW OF SYSTEMS:  She denies any fevers or chills.  She has had  significant congestion and productive cough.  She has not had any bright  red blood per rectum.  No melena.  No chest pain.  No orthopnea.  No  PND.  No focal neurologic deficits.  She does have chronic arthritis.  Remainder review of systems is negative except for HPI and problem list.   PAST  MEDICAL HISTORY:  1. History of coronary artery disease.      a.     Status post inferior wall myocardial infarction in 1996       treated with stenting of the right coronary artery.       Nonobstructive disease elsewhere.  2. History of congestive heart failure.      a.     Ejection fraction of 35% by Myoview in 2000.  3. Diabetes.  4. Hypertension.  5. Hyperlipidemia.  6. Previous left ankle fracture 1990 and right ankle fracture 1992      status post repair.  7. Anemia.   CURRENT MEDICATIONS:  1. Quinapril 40 mg a day.  2. Lasix 80 mg once a day.  3. Glipizide 10 mg once a day.  4. Actos 15 mg once a day.  5. Vytorin 10/20 1 tablet daily.  6. Januvia 50 mg once a day.  7. Welchol 625 mg 3 tablets b.i.d.  8.  Lovaza 4 tablets daily.  9,  Tramadol.  1. Carvedilol 25 b.i.d.  2. Aspirin 81 mg a day.   ALLERGIES:  She has no known drug allergies.   SOCIAL HISTORY:  She is a retired Engineer, civil (consulting).  She is widowed.  She is  retired, currently lives at home with one of her daughters.   FAMILY HISTORY:  Apparently both of her parents as well as her siblings  were murdered and she had experienced a very tragic childhood.  No  significant family history is available.   On physical exam, she is lying in bed.  She is congestive and feels  weak.  She is also complaining of pain in her left calf.  Temperature is 96.6.  Blood pressure is 190/65.  Heart rate of 67.  Respirations are 18.  She is satting anywhere from 91%-99% on room air.  HEENT:  Normal.  NECK:  Supple.  No obvious JVD.  Carotids are 2+ bilaterally with no  obvious bruits.  There is no lymphadenopathy or thyromegaly.  CARDIAC:  She has distant heart sounds.  She is regular.  She has an S4.  No obvious murmurs.  LUNGS:  Diffuse rhonchi.  There is no wheezing.  ABDOMEN:  Obese, nontender, nondistended.  No hepatosplenomegaly, no  bruits.  No masses.  EXTREMITIES:  Warm.  There is no cyanosis or clubbing.  On the back of  her  left calf there is a small superficial palpable cord which is  tender.  There is mild swelling.  Her DP pulses 3+ in the left and 1+ on  the right.  There is no cellulitis.  NEURO:  Alert and oriented x3, cranial nerves II through XII are intact.  Moves all 4 extremities without difficulty.  Affect is pleasant.   White count 4.5, hemoglobin 10.7, platelets are 188.  INR is 1.2, sodium  is 143, potassium 4.1, chloride 112, CO2 is 27, glucose 143, BUN is 13,  creatinine 0.89.  D-dimer 0.73.  EKG shows a sinus rhythm with a left  bundle branch block.  This is old.  However, QRS is slightly more  prolonged than previous.   ASSESSMENT:  1. Left lower extremity pain and swelling, rule out deep vein      thrombosis.  2. History of previous bilateral ankle fracture status post repair.  3. Coronary artery disease status post previous inferior myocardial      infarction.  4. Hypertension.  5. Diabetes.  6. History of congestive heart failure with ejection fraction of 35%      by Myoview in 2000.   PLAN:  Will be to admit her.  We will start her on Heparin.  We will  proceed with an ultrasound of lower extremity in the morning to evaluate  for DVT.  Will focus on pain and blood pressure control this evening.      Bevelyn Buckles. Bensimhon, MD  Electronically Signed     DRB/MEDQ  D:  03/10/2008  T:  03/11/2008  Job:  [id number]   cc:   Birdena Jubilee, MD  Colleen Can. Deborah Chalk, M.D.

## 2010-08-03 NOTE — Consult Note (Signed)
NEW PATIENT CONSULTATION   [name], [name]  DOB:  05-Apr-1927                                       05/22/2009  ZOXWR#:[id number]   This was a missed dictation from 05/22/2009.   Patient presents today for evaluation of arterial flow regarding her  ulceration in her right ankle.  She had been seen in our office in the  past for claudication-type symptoms.  She is seen at a local wound  center and is having treatment for this.  We are seeing her for further  evaluation of arterial flow.  She has an ulceration over her right  medial malleolus.  She denies any specific pain associated with this.  She does have chronic right hip pain.  She is able to walk.   Her past history is significant for hypertension, diabetes which is non-  insulin-dependent.  Does have history of right ankle fracture in 2005  and had myocardial infarction in 1996.  She denies pulmonary  difficulties.   Family history is negative for premature atherosclerotic disease.   SOCIAL HISTORY:  She does has 2 children.  She is retired.  She does not  smoke, having quit in the 1970s.  Does not drink alcohol on a regular  basis.   REVIEW OF SYSTEMS:  Negative for weight loss or weight gain.  She  reports her weight is 150 pounds.  She is 5 feet 2 inches tall.  CARDIAC:  Negative aside from her prior myocardial infarction.  PULMONARY:  No oxygen dependency or productive cough.  GI:  Without blood in her stool or peptic ulcer disease.  GU:  Negative.  VASCULAR:  Known for wound-healing issues discussed.  NEUROLOGIC:  No dizziness or blackouts.  MUSCULOSKELETAL:  No arthritic joint pain.  PSYCHIATRIC:  No anxiety or depression.  HEENT:  Positive for some hearing loss.  HEMATOLOGIC:  No bleed issues.   PHYSICAL EXAMINATION:  A well-developed and well-nourished black female  appearing stated age.  Blood pressure is 144/69, pulse 53, respirations  18.  She is in no acute distress.  HEENT is normal.   Chest is clear.  Heart:  Regular rate and rhythm.  She does have palpable radial and  palpable femoral pulses, palpable popliteal pulses.  She does have a  signal on her right anterior tibial.  I do not palpate a pulse.  She  does have a clean ulcer at the base on her medial malleolus, otherwise  no skin ulcers or rashes.  Neurologic is without weakness or  paresthesias.   I had a long discussion with the patient.  I do feel that she has  adequate flow for healing this and certainly would not recommend any  aggressive attempts at revascularization.  She has a nice, clean healing  wound.  I would recommend continued local wound care.  Should this  progress, her next step would be arteriography for further evaluation.  We will be notified by the wound center should she develop any new  difficulty.     Larina Earthly, M.D.  Electronically Signed   TFE/MEDQ  D:  06/05/2009  T:  06/08/2009  Job:  [id number]

## 2010-08-04 ENCOUNTER — Encounter (HOSPITAL_COMMUNITY): Payer: PRIVATE HEALTH INSURANCE

## 2010-08-06 ENCOUNTER — Encounter (HOSPITAL_COMMUNITY): Payer: PRIVATE HEALTH INSURANCE

## 2010-08-06 NOTE — Discharge Summary (Signed)
[facility]  Patient:    [name], [name]                       MRN: [id number] Adm. Date:  [id number] Disc. Date: [id number] Attending:  Marlowe Kays Page Dictator:   Druscilla Brownie. Shela Nevin, P.A.                           Discharge Summary  ADMISSION DIAGNOSES: 1. Trimalleolar fracture of the right ankle, displaced. 2. Coronary artery disease. 3. Congestive heart failure. 4. Hypertension. 5. Non-insulin-dependent diabetes mellitus.  DISCHARGE DIAGNOSES: 1. Trimalleolar fracture of the right ankle, displaced. 2. Coronary artery disease. 3. Congestive heart failure. 4. Hypertension. 5. Non-insulin-dependent diabetes mellitus. 6. Urinary tract infection (treated).  OPERATIONS:  On June 22, 2000, the patient underwent open reduction and internal fixation of ______  component fracture right ankle with cannulated screw in medial malleolus and Rush rod in distal fibular fracture supplemented by allograft bone.  Jenne Campus assisted.  CONSULTATIONS:  None.  HISTORY OF PRESENT ILLNESS:  This 77 year old lady was at home.  She was descending from stairs.  She missed the last step, and all her weight came down on her right ankle.  She had immediate pain and deformity in the right ankle and was unable to stand.  She was brought to the emergency room, where x-rays revealed a trimalleolar displaced fracture of the right ankle.  She was readied for surgery and underwent the above procedure.  HOSPITAL COURSE:  The patient tolerated the surgical procedure quite well. She had considerable amount of pain and discomfort postoperatively and reticence to getting out of bed and moving about.  We asked physical therapy to see the patient.  They instructed her both on ADLs as well as ambulation with walker, nonweightbearing right lower extremity.  We truly encouraged her on the day prior to discharge to begin with her activities, as she would need to be  more independent prior to discharge.  We did not feel that she could go home safely with the little ambulation and unsteady gait that she did have. Eventually she was able to have more confidence in ambulation.  Physical therapy worked with the patient, and she was able to ambulate about 20 feet, maintaining nonweightbearing right lower extremity.  It was felt she could be maintained in her home environment, and arrangements were made for discharge. Due to the access and her still somewhat unsteady gait, it was felt that she may need an ambulance for transport to her home.  This is totally for the patients safety in transfer.  As noted, the patient ran a low-grade temperature.  We discontinued her Foley, checked a urinalysis and, indeed, she had a urinary tract infection.  She was allergic to sulfa and penicillin, and we treated her with Cipro 100 mg b.i.d. x 3 days.  No repeat urinalysis was done, and she was still under treatment at the time of discharge.  On the day of discharge the patients temperature was 99.0, blood pressure was 150/76.  She was discharged home in the care of her family.  She is to continue with home medications and diet.  LABORATORY DATA:  Hematologically showed a CBC with differential with a mild anemia, RBC was 3.78, hemoglobin was 11.4, hematocrit was 33.0, differential was normal.  Blood chemistries were essentially normal.  She did have an elevated glucose on admission at 271, after  surgery it was 188.  BUN was 26. Urinalysis was positive for urinary tract infection, it was cloudy, large amount of hemoglobin (post Foley), many bacteria, and hippuric acid crystals (this was treated with Cipro).  Chest x-ray showed no acute chest disease.  EKG showed normal sinus rhythm with left atrial enlargement.  CONDITION ON DISCHARGE:  Improved and stable.  PLAN:  The patient is discharged to her home in the care of her family.  Keep the right lower extremity  elevated.  She is to continue nonweightbearing right lower extremity.  Use ice p.r.n. to the ankle.  She is to continue with home medications and diet.  Use Percocet 5 mg #40 1-2 q.4-6h. p.r.n. pain.  Robaxin 500 mg #30 1 q.6h. p.r.n. muscle spasms.  Cipro 100 mg 1 b.i.d.  She will take 1 this evening (day of discharge) and 1 b.i.d. tomorrow, then stop.  She said that she does not have a medical physician.  I recommend that she follow up with one in at least two weeks or sooner for a urinalysis, even to the point of going to an urgent care facility of some kind.  I might note that on the culture of the urine there was no growth x 1 day.  She is to return to the center to see Dr. Simonne Come two weeks after the date of surgery, call for an appointment at [phone]. DD:  06/27/00 TD:  06/27/00 Job: [id number]

## 2010-08-06 NOTE — Op Note (Signed)
NAME:  [name], [name]                          ACCOUNT NO.:  [id number]   MEDICAL RECORD NO.:  [id number]                   PATIENT TYPE:  AMB   LOCATION:  DAY                                  FACILITY:  [facility]   PHYSICIAN:  [name]., M.D.          DATE OF BIRTH:  1927-05-16   DATE OF PROCEDURE:  11/06/2001  DATE OF DISCHARGE:  04/26/2001                                 OPERATIVE REPORT   PREOPERATIVE DIAGNOSES:  1. Persistent right carpal tunnel syndrome, status post previous right     carpal tunnel release 04/26/01, with positive electrodiagnostic studies     dated 10/26/01 documenting prolongation of the motor and sensory latencies     of the right median nerve and a prolonged lumbrical interosseus     difference persisting.  2. Chronic stenosing tenosynovitis of the right first dorsal compartment.  3. Chronic stenosing tenosynovitis, right thumb at A1 pulley.   POSTOPERATIVE DIAGNOSES:  1. Persistent right carpal tunnel syndrome, status post previous right     carpal tunnel release 04/26/01, with positive electrodiagnostic studies     dated 10/26/01 documenting prolongation of the motor and sensory latencies     of the right median nerve and a prolonged lumbrical interosseus     difference persisting.  2. Chronic stenosing tenosynovitis of the right first dorsal compartment.  3. Chronic stenosing tenosynovitis, right thumb at A1 pulley.   PROCEDURES:  1. Release of right transverse carpal ligament and neurolysis of right     median nerve at carpal tunnel, status post previous carpal tunnel     release.  2. Release of the right first dorsal compartment.  3. Release of right thumb A1 pulley.   Note that all three procedures are through separate incisions.   SURGEON:  [name], M.D.   ASSISTANT:  [name], P.A.   ANESTHESIA:  General by LMA supervised by the anesthesiologist, Janetta Hora.  Gelene Mink, M.D.   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   She was evaluated in 2002 by Illene Labrador. A[place]ton, M.D., at [facility] ans was referred to see Caralyn Guile. Ethelene Hal, M.D., for  electrodiagnostic studies.  These confirmed bilateral carpal tunnel  syndrome.   On 04/26/01 Dr. Simonne Come brought the patient to the operating room for  release of her right transverse carpal ligament through an extended palmar  and distal forearm incision.   Postoperatively she did not experience relief of her numbness and had  persistent discomfort.   She had further pain in her arm and was evaluated with an MRI, which  demonstrated a chronic stage 3 impingement of the right shoulder with AC  arthropathy and a full-thickness rotator cuff tear.   Dr. Simonne Come recommended a possible surgical approach to her hand as well  as her shoulder.  She elected to transfer [facility] care to the [facility].   On exam she was noted to have signs of persistent carpal tunnel syndrome.  Electrodiagnostic studies repeated by Laurier Nancy, M.D., on 10/26/01  documented a significant right carpal tunnel syndrome.  She was noted to  have mild thenar atrophy persisting and a positive Tinel sign at the distal  wrist flexion creases.   She is brought to the operating room at this time anticipating release of  her right trigger thumb, right first dorsal compartment, and right carpal  canal.   DESCRIPTION OF PROCEDURE:  The patient was brought to the operating room and  placed in the supine position on the operating table.  Following induction  of general anesthesia by LMA, the right arm was prepped with Betadine soap  and solution and sterilely draped.  Following exsanguination of the limb  with an Esmarch bandage, the arterial tourniquet on the proximal brachium  was inflated to 200 mmHg even.   The procedure commenced with a  short transverse incision in the proximal  thumb flexion crease.  Subcutaneous tissues were carefully divided, taking  care to identify the radial proper digital nerve.  This was gently  retracted, and a [name] was used to clear the A1 pulley of soft tissues.  The  pulley was split with scalpel and scissors and the flexor pollicis longus  tendon delivered.  Thereafter, free range of motion of the IP joint was  recovered.  This wound was repaired with intradermal 3-0 Prolene suture.   Attention was then directed to the first dorsal compartment, where a  transverse incision was fashioned directly over the palpably enlarged  pulley.  The subcutaneous tissues were carefully divided, taking care to  identify the radial sensory branches and gently retract them with blunt  retractors.  The first dorsal compartment was identified and was  subsequently sized with a scalpel.  Two slips of the abductor pollicis  longus were noted.  The extensor pollicis brevis was separated by a formal  septum.  This was exposed by release of the dorsal compartment and resection  of the septum with scissors.   Thereafter, free range of motion of the thumb CMC joint, particularly in  flexion, as well as free range of motion of the wrist was recovered.  The  wound was then repaired with intradermal 3-0 Prolene suture.   Attention was then directed to the carpal canal.  An incision was fashioned  in the line of the ring finger approximately 4 mm ulnar to the original  incision.  The subcutaneous tissues were carefully divided in the region of  the transverse carpal ligament and hook of hamate.  The palmar fascia was  split longitudinally and the superficial palmar arch located.  The common  sensory branches of the median nerve were identified and followed back to  the level of the scarred transverse carpal ligament.   A blunt instrument was placed deep to the transverse carpal ligament, separating the adhesions from  the deep surface of the ligament.  The  ligament was then released with scissors along its ulnar border.  At the  level of the distal wrist flexion crease, there was noted to be dense  scarring and a definite constriction point.  There was hypertrophic synovium  due to this tight point causing friction on the ulnar bursa.   Due to poor visualization, a transverse incision was fashioned just proximal  to the  distal wrist flexion crease, then gentle dissection was used to  release the volar forearm fascia.  Once the nerve and tendons were carefully  identified, the volar forearm fascia was released approximately 6 cm above  the wrist flexion crease with scissors.   The median nerve was noted to be invested in scarred tenosynovium.  Care was  taken to assure that the segment of the median nerve, each of the flexion  creases, and transverse carpal ligament were fully decompressed.  A formal  neurolysis was not accomplished.   Bleeding points were electrocauterized with bipolar current, followed by  repair of the skin with intradermal 3-0 Prolene suture.   A compressive dressing was applied with a volar plaster splint maintaining  the wrist in 5 degrees of dorsiflexion.   For aftercare the patient is given a prescription for Dilaudid 2 mg one or  two tablets p.o. q.4-6h. p.r.n. pain, a total of 25 tablets without refill.                                               Ashley Fitch Naaman Walters., M.D.    RVS/MEDQ  D:  11/06/2001  T:  11/07/2001  Job:  [id number]   cc:   Colleen Can. Deborah Chalk, M.D.   Anesthesia Department

## 2010-08-06 NOTE — Op Note (Signed)
NAME:  [name], [name] NO.:  [id number]   MEDICAL RECORD NO.:  [id number]          PATIENT TYPE:  AMB   LOCATION:  DSC                          FACILITY:  [facility]   PHYSICIAN:  Katy Fitch. Sypher, M.D. DATE OF BIRTH:  Feb 07, 1928   DATE OF PROCEDURE:  11/08/2005  DATE OF DISCHARGE:                                 OPERATIVE REPORT   PREOPERATIVE DIAGNOSIS:  Chronic stenosing tenosynovitis, left ring finger  at A1 pulley.   POSTOPERATIVE DIAGNOSIS:  Chronic stenosing tenosynovitis, left ring finger  at A1 pulley.   OPERATION:  Release of left ring finger A1 pulley.   OPERATING SURGEON:  Josephine Igo, M.D.   ASSISTANT:  Annye Rusk, P.A.   ANESTHESIA:  2% lidocaine metacarpal head level block, left ring finger,  supplemented by IV sedation.   SUPERVISING ANESTHESIOLOGIST:  Dr. Jairo Ben.   INDICATIONS:  [name] is a 77 year old woman referred by Dr. Delfin Edis for chronically painful left ring trigger finger.  She has  background coronary artery disease treated by Dr. Deborah Chalk and is followed  from a primary care perspective by Dr. Birdena Jubilee.   She has had triggering for months in her left ring finger with pain and  locking.  She was referred for evaluation and management.  After informed  consent during which we advised steroid injection versus release of A1  pulley, [name] elected to proceed directly to A1 pulley release under  local anesthesia and sedation.   After informed consent, she is brought to the operating room at this time.   PROCEDURE:  [name] is brought to the operating room and placed in the  supine position on the operating table.   Following an anesthesia consult with Dr. Jean Rosenthal, monitored anesthesia care  was recommended.   She was brought to the operating room and placed in the supine position on  the operating table.  Following light sedation, the left arm was prepped  with Betadine soap solution and  sterilely draped.  2% lidocaine was  infiltrated into the region of the A1 pulley.   After exsanguination of the left arm with an Esmarch bandage, the arterial  tourniquet was inflated to 250 mmHg due to systolic hypertension.  The  procedure commenced with a short transverse scission directly over the A1  pulley.  Subcutaneous tissues were carefully divided revealing the palmar  fascia.  This was released with scissors followed by isolation of the A1  pulley.  The pulley was split with scalpel and scissors along its radial  border.  After complete release of A1 pulley, the tendons were delivered and  fibrotic tenosynovium removed with the rongeur.   Thereafter, full passive range of motion of the fingers recovered.  There  was no sign of residual triggering or other pathology noted.   The wound was then repaired with mattress suture of 5-0 nylon.  There were  no apparent complications.   [name] tolerated surgery and anesthesia well.  She was placed in a  compressive dressing and transferred to the recovery room with stable signs.  For  aftercare, she is provided a prescription for Darvocet-N 100, one by  mouth every 4 to 6 hours p.r.n. pain, 20 tablets without refill.      Katy Fitch Sypher, M.D.  Electronically Signed     RVS/MEDQ  D:  11/08/2005  T:  11/08/2005  Job:  [id number]   cc:   Colleen Can. Deborah Chalk, M.D.  Birdena Jubilee, MD

## 2010-08-06 NOTE — Op Note (Signed)
NAME:  [name], [name]                          ACCOUNT NO.:  [id number]   MEDICAL RECORD NO.:  [id number]                   PATIENT TYPE:  AMB   LOCATION:  DSC                                  FACILITY:  [facility]   PHYSICIAN:  Katy Fitch. Naaman Plummer., M.D.          DATE OF BIRTH:  Feb 18, 1928   DATE OF PROCEDURE:  12/18/2001  DATE OF DISCHARGE:                                 OPERATIVE REPORT   PREOPERATIVE DIAGNOSES:  1. Entrapment neuropathy, median nerve, left carpal tunnel.  2. Chronic stenosing tenosynovitis, left first dorsal compartment.   POSTOPERATIVE DIAGNOSES:  1. Entrapment neuropathy, median nerve, left carpal tunnel.  2. Chronic stenosing tenosynovitis, left first dorsal compartment.   PROCEDURES:  1. Release of left transverse carpal ligament.  2. Release of left first dorsal compartment through separate incision with     resection of septum between extensor pollicis brevis and abductor     pollicis longus tendons.   SURGEON:  Katy Fitch. Sypher, M.D.   ASSISTANT:  [name], P.A.   ANESTHESIA:  General by LMA supervised by the anesthesiologist, [name].  [name], M.D.   INDICATIONS:  The patient is a 77 year old woman with background type 2  diabetes.  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  She is status post release of her right transverse  carpal ligament with good results.  She is now scheduled for release of the  left transverse carpal ligament.  In addition, she was noted to have a  painful swelling at her first dorsal compartment and all the positive  provocative signs of stenosing tenosynovitis at the first dorsal  compartment.   Due to a failure to respond to nonoperative management, she is brought to  the operating room at this time for release of her left transverse carpal  ligament and first dorsal compartment.   DESCRIPTION OF PROCEDURE:  The patient was brought to the operating room and  placed in the supine position upon the operating table.  Following induction  of general anesthesia by LMA, the left arm was prepped with Betadine soap  and solution and sterilely draped.   Following exsanguination of the left arm with an Esmarch bandage, an  arterial tourniquet on the proximal brachium was inflated to 210 mmHg.  The  procedure commenced with a short incision in the line of the ring finger in  the palm.  The subcutaneous tissues were carefully divided down to the  palmar fascia.  This was split longitudinally to reveal the common sensory  branch of the median nerve.   These were followed back to the transverse carpal ligament, which was  carefully isolated from the median nerve.  The ligament was then released on  its ulnar border, extending to the distal forearm.  This widely opened the  carpal canal.  No masses or other predicaments were noted.   Bleeding  points along the margin of the released transverse carpal ligament  were electrocauterized with bipolar current.   The wound was then repaired with intradermal 3-0 Prolene.   Attention was then directed to the first dorsal compartment.  A short  transverse incision was fashioned directly over the compartment.  The  subcutaneous tissues were carefully divided, taking care to identify the  radial sensory branches.  These were gently retracted.  The compartment was  split longitudinally, revealing two slips of the abductor pollicis longus  tendon.  A fully-formed dorsal compartment was noted with a single slip of  the extensor pollicis brevis.  This was identified distally and followed  back with release of the compartment and excision of the septum with a  rongeur.  The extensor pollicis brevis had signs of chronic compression.   The wound was then repaired with intradermal 3-0 Prolene and a Steri-Strip.  A compressive dressing was applied with a volar plaster splint maintaining  the wrist in 5 degrees of  dorsiflexion.  For aftercare the patient has been  given a prescription for Darvocet-N 100 one p.o. q.4-6h. p.r.n. pain, 24  tablets without refill.  She will return to our office in follow-up in seven  to 10 days .                                               [name], M.D.    RVS/MEDQ  D:  12/18/2001  T:  12/18/2001  Job:  [id number]

## 2010-08-06 NOTE — Op Note (Signed)
NAME:  [name], [name]                ACCOUNT NO.:  [id number]   MEDICAL RECORD NO.:  [id number]          PATIENT TYPE:  AMB   LOCATION:  SDS                          FACILITY:  [facility]   PHYSICIAN:  Janetta Hora. Fields, MD  DATE OF BIRTH:  14-Apr-1927   DATE OF PROCEDURE:  10/11/2005  DATE OF DISCHARGE:  10/11/2005                                 OPERATIVE REPORT   PROCEDURE:  Aortogram with bilateral lower extremity runoff.   PREOPERATIVE DIAGNOSIS:  Claudication bilateral lower extremities.   POSTOPERATIVE DIAGNOSIS:  Claudication bilateral lower extremities.   ANESTHESIA:  Local.   OPERATIVE DETAIL:  After obtaining informed consent, the patient was brought  to the [facility] lab.  The patient placed supine position on the angio table.  Both  groins were prepped and draped usual sterile fashion.  Local anesthesia was  infiltrated over the right common femoral artery.  Majestic needle was used  to cannulate the right common femoral artery and a 0.035 J-tip Rosen wire  threaded into the abdominal aorta under fluoroscopic guidance.  Next a 5-  [place] sheath was placed over the guidewire into the right common femoral  artery.  5-French pigtail catheter was then placed over the guidewire into  the abdominal aorta.  Abdominal aortogram was obtained.  This shows the left  and right renal arteries are patent.  There is mild atherosclerotic change  of the abdominal aorta and aortic bifurcation.  Left and right common iliac,  left and right external iliac, and the left and right internal iliac  arteries are patent.  Lower extremity runoff views were then obtained.  The  left and right common femoral arteries are patent.  The left and right  profunda femoris and superficial femoral arteries are patent in their  proximal portion.  There is moderate atherosclerotic change of both  superficial femoral arteries in the area of the adductor hiatus but no focal  area of stenosis.  Popliteal arteries are patent  bilaterally.  Tibioperoneal  trunk is patent on the left side.  The proximal portion of the anterior  tibial, posterior tibial and peroneal arteries are patent.  On the right  side there is an AV fistula communication from the tibioperoneal trunk to  the popliteal vein.  The left leg has occlusion of the posterior tibial  artery and anterior tibial artery in the mid leg.  There is runoff via the  peroneal artery to the left foot.  On the right leg the posterior tibial  artery and peroneal arteries occlude in the mid leg.  There is single runoff  via the anterior tibial artery which is patent at the dorsalis pedis artery  all the way into the foot.  The plantar arch fills completely from the  dorsalis pedis artery across the foot.   Next the pigtail catheter was removed over a guidewire.  The sheath was left  in place to be removed in the holding area.  The patient tolerated procedure  well and there were complications.  The patient was taken to the holding  area in stable condition.   OPERATIVE  FINDINGS:  Bilateral tibial vessel occlusive disease with one-  vessel runoff via anterior tibial artery on the right foot and one-vessel  runoff via the peroneal artery on the left foot.  There is an AV fistula  communication between the tibioperoneal trunk on the right to the popliteal  vein.      Janetta Hora. Fields, MD  Electronically Signed     CEF/MEDQ  D:  10/13/2005  T:  10/13/2005  Job:  [id number]

## 2010-08-06 NOTE — Op Note (Signed)
[facility]  Patient:    [name], [name] Visit Number: [id number] MRN: [id number]          Service Type: DSU Location: DAY Attending Physician:  Marlowe Kays Page Dictated by:   Illene Labrador. Aplington, M.D. Proc. Date: 04/26/01 Admit Date:  04/26/2001                             Operative Report  PREOPERATIVE DIAGNOSIS:  Right carpal tunnel syndrome.  POSTOPERATIVE DIAGNOSIS:  Right carpal tunnel syndrome.  OPERATION PERFORMED:  Decompression median nerve right wrist and hand.  SURGEON:  Illene Labrador. Aplington, M.D.  ASSISTANT:  Nurse.  ANESTHESIA:  IV regional.  PATHOLOGY AND JUSTIFICATION FOR PROCEDURE:  She has significant numbness in the right hand with nerve conduction studies demonstrating bilateral carpal tunnel syndrome right greater than left of moderate to significant severity. At surgery, she had very marked compression of the nerve just distal to the flexor crease where the nerve ______ is being very bulbous and distal being flattened out and in the mid palm.  DESCRIPTION OF PROCEDURE:  Satisfactory IV regional anesthesia, Duraprep from mid forearm to fingertips were draped in a sterile field. The usual carpal tunnel incision was marked out crossing at the base of the thenar eminence, obliquely over the flexor crease of the wrist and the distal forearm on the radial side. I first identified the palmaris longus tendon beneath the median nerve which was protected and the overlying skin, subcutaneous tissue, and thickened fascia was released into the mid palm. Bleeders were coagulated with bipolar cautery. The neurovascular structures were dissected out gently with hemostat in the mid palm and with particular care because of the significant compression of the nerve. I then went distally where fibrous bands were released from the nerve into the distal palm. Once the decompression had been completed, I irrigated the wound well with sterile  saline and there did not appear to be any bleeding at this point. The skin and subcutaneous tissue were then closed with interrupted 4-0 nylon mattress sutures. Betadine Adaptic dry sterile dressing and volar plaster splint were applied. The tourniquet was released and at the time of this dictation she was on her way to the recovery room in satisfactory condition having tolerated the procedure well with no known complications. Dictated by:   Illene Labrador. Aplington, M.D. Attending Physician:  Joaquin Courts DD:  04/26/01 TD:  04/27/01 Job: [id number]

## 2010-08-06 NOTE — H&P (Signed)
[place]. [facility]  Patient:    [name], [name]                       MRN: [id number] Adm. Date:  [id number] Attending:  Marlowe Kays Page Dictator:   Druscilla Brownie. Shela Nevin, P.A. CC:         Colleen Can. Deborah Chalk, M.D.                         History and Physical  CHIEF COMPLAINT: "Pain in my right ankle."  HISTORY OF PRESENT ILLNESS: This patient is a 77 year old white female who was at home and was descending some stairs when she missed the last step and all of her weight came down onto her right ankle.  She had immediate pain in the right ankle and was unable to stand.  She was brought to the emergency room and x-rays revealed a trimalleolar fracture of the right ankle.  The patient had no syncopal episode, no blackout spell, and no seizure.  She merely missed the last step and fell.  The patient is being admitted for open reduction and internal fixation of the right ankle.  PAST MEDICAL/SURGICAL HISTORY:  1. Previous open reduction and internal fixation of the left ankle in 1990 by     Dr. Meade Maw.  2. MI in 1996.  She is currently being followed by Dr. Deborah Chalk for her     cardiac checkups.  3. Hypertension.  4. Non-insulin dependent diabetes mellitus.  CURRENT MEDICATIONS:  1. Isosorbide 20 mg one q.d.  2. Furosemide 40 mg p.r.n. swelling.  3. Metoprolol 50 mg one q.d.  4. Glucophage 1000 mg one q.d.  5. Accupril 40 mg one q.d.  6. Amaryl 4 mg q.d.  7. Norvasc.  SOCIAL HISTORY: The patient neither smokes nor drinks.  She is a retired Engineer, civil (consulting).  FAMILY HISTORY: Noncontributory.  REVIEW OF SYSTEMS: CNS: No seizure disorder, paralysis, or double vision. RESPIRATORY: No productive cough, no hemoptysis, no shortness of breath. CARDIOVASCULAR: No chest pain, no angina, no orthopnea.  GASTROINTESTINAL: No nausea, vomiting, melena, or bloody stools.  GENITOURINARY: No discharge, dysuria, or hematuria.  MUSCULOSKELETAL: Primarily as in present  illness with the right ankle.  PHYSICAL EXAMINATION:  GENERAL: Alert, cooperative, and friendly 77 year old white female seen lying on an emergency room stretcher.  She is fully alert and oriented.  She is accompanied by her daughter, [name], and her son-in-law.  VITAL SIGNS: Blood pressure 192/70, pulse 90, respirations 18, temperature 97.3 degrees.  HEENT: Head normocephalic.  PERRLA.  No head lesions or trauma.  Oropharynx clear.  CHEST: Clear to auscultation.  No rales or rhonchi.  No wheezes.  HEART: Regular rate and rhythm.  No murmurs heard.  ABDOMEN: Soft, obese, nontender.  Liver and spleen not felt.  GU/RECTAL: Examinations not done, not pertinent to present illness.  EXTREMITIES: Right ankle seen in a foam type immobilizer.  She has obvious displacement of the foot at the ankle.  Pulses are good.  Sensory intact.  She can move her toes.  ADMISSION DIAGNOSES:  1. Trimalleolar fracture of right ankle.  2. Coronary artery disease.  3. Congestive heart failure.  4. Hypertension.  5. Non-insulin dependent diabetes mellitus.  PLAN: The patient will undergo open reduction and internal fixation of the right ankle.  Should she have any cardiac problems we will certainly ask Dr. Deborah Chalk to follow along with [place] during this patients hospitalization. DD:  06/22/00 TD:  06/23/00 Job: [id number]

## 2010-08-06 NOTE — Op Note (Signed)
[place]. [facility]  Patient:    [name], [name]                       MRN: [id number] Proc. Date: 06/22/00 Adm. Date:  [id number] Attending:  Marlowe Kays Page                           Operative Report  PREOPERATIVE DIAGNOSIS:  Closed displaced trimalleolar fracture, right ankle.  POSTOPERATIVE DIAGNOSIS:  Closed displaced trimalleolar fracture, right ankle.  OPERATION PERFORMED:  Open reduction internal fixation, bimalleolar component, fracture of right ankle with cannulated screw in medial malleolus and Rush rod in distal fibular fracture supplemented by allograft bone.  SURGEON:  Illene Labrador. Aplington, M.D.  ASSISTANTDruscilla Brownie. Shela Nevin, P.A.  ANESTHESIA:  General.  INDICATIONS FOR PROCEDURE:  She injured her ankle a few hours earlier.  As stated in the diagnosis, both the medial malleolus and the distal fibular fractures were displaced with the fibular fracture being quite comminuted. She also had a small posterior malleolus fracture which was displaced proximally.  DESCRIPTION OF PROCEDURE:  Prophylactic antibiotics, satisfactory general anesthesia, Foley catheter inserted.  Pneumatic tourniquet, right leg from just past the knee to ankle was prepped with DuraPrep and draped into a sterile field.  Incisions were marked out with a marking pen with the medial one just superior to the medial malleolus and proximal to the joint line curving distalward and beneath the medial malleolus and the lateral one extending from the fracture site down to the distal fibula.  First opened the joint medially and irrigated the joint and cleared it of clotted material. The periosteum was stripped to either side. It had partially avulsed anyway but this allowed me to see the bone edges.  The posterior tibial tendon was noted posteriorly and was intact and protected.  The saphenous vein anteriorly was protected.  I then went laterally and opened the fracture  site which was extremely comminuted and then also exposed the distal fibula where I placed a small drill hole snd then was able to place one of the intermediate sized augers across the fracture site in the proximal fibula until it was found to be in good position on C-arm and AP and lateral x-rays.  I then placed roughly a 6-inch Rush rod of this diameter and under C-arm control threaded up the fibula once again checking it with AP and lateral C-arm.  I then went back to the medial malleolus and held anatomic reduction with a towel clip as I placed a guide pin through the 4.5 cannulated screw.  A 4.0 drill was placed over the guide pin followed by the 40 mm fully threaded screw which I tightly impacted. I then impacted the Rush rod laterally.  Similar comminuted posterolateral fibular fractures I was partially able to reduced with circumferential #1 Vicryl sutures and I supplemented the lateral fibula where some of the Rush rod was exposed by packing this with allograft bone.  Final AP and lateral x-rays were taken with a C-arm and looked excellent.  The ankle mortise was reduced.  The posterior tibial fragment was anatomically reduced and the hardware medially and laterally was in good position with the fractures reduced.  I then closed the medial wound with interrupted 2-0 Vicryl in the periosteum and subcutaneous tissue with 4-0 nylon interrupteds in the skin, laterally I closed the subcutaneous tissues over the bone graft with interrupted  2-0 Vicryl suture with narrow staples in the skin.  Betadine, Adaptic, dry sterile dressing with a well padded short-leg splint case were applied.  The tourniquet was released.  She tolerated the procedure well and was taken to the recovery room in satisfactory condition with no known complication. DD:  06/22/00 TD:  06/23/00 Job: [id number]

## 2010-08-06 NOTE — H&P (Signed)
[place]. [facility]  Patient:    [name]                        MRN: [id number] Adm. Date:  [id number] Attending:  Eleanora Neighbor Dictator:   Jennet Maduro Earl Gala, R.N., A.N.P. CC:         Luna Fuse, M.D.             Colleen Can. Deborah Chalk, M.D.                         History and Physical  CHIEF COMPLAINT:  Prolonged episode of chest pain.  HISTORY OF PRESENT ILLNESS:  Mrs. Spadoni is a pleasant 77 year old white female who has multiple medical problems and known atherosclerotic cardiovascular disease ho presents to the emergency room department this morning per EMS after a prolonged episode of chest pain. She is subsequently admitted for further evaluation.  PAST MEDICAL HISTORY:  1. ASCVD. Status post inferior wall myocardial infarction sustained on December 04, 1994. She subsequently underwent stent deployment x 3 with J&J stents to     the right coronary artery on December 14, 1994. Other findings at that time     showed mild LV dysfunction, two-vessel obstructive CAD involving a small     diagonal and anomalous posterior lateral branches. She had recurrent chest     pain and congestive heart failure on November 29 and underwent catheterization     per Peter [place], M.D. showing the stent placement to be patent with mild LV     dysfunction.  2. Non-insulin-dependent diabetes.  3. Hypertensive heart disease.  4. PVCs.  5. History of appendectomy.  6. History of tonsillectomy.  7. Childbirth x 2.  8. History of a left fractured ankle.  ALLERGIES:  PENICILLIN and SULFA.  CURRENT MEDICATIONS:  1. Vitamin E.  2. Daily calcium.  3. Daily garlic tablets.  4. Daily Isosorbide 20 mg daily.  5. Lasix 40 on a p.r.n. basis.  6. Glucophage 500 two tabs b.i.d.  7. K-Dur 20 mEq daily.  8. Accupril 40 q.d.  9. Amaryl 4 mg tablets two tablets per day. 10. Baycol 0.3 daily. 11. Aspirin daily.  FAMILY HISTORY:  Basically unknown  from a medical standpoint. Both of her parents as well as her siblings were murdered, and she experienced a very tragic childhood; and subsequently, no real medical history is available.  SOCIAL HISTORY:  She lives by herself. She has two children. There is no alcohol. No tobacco abuse. She was previously a surgical nurse.  REVIEW OF SYSTEMS:  Basically as noted above. She basically has not been seen in the office since 1998 but has done well from a cardiac standpoint per her report. This is her first episode of prolonged chest pain in which she has had to take nitroglycerin. She continues to see Luna Fuse, M.D. in follow up for  general health maintenance and reports that things have been quite stable from hat regards. She has had no recent fever, flu, cough-like symptoms. She did take a lu shot this year. She has had no prior episodes of chest pain. Really no episodes of shortness of breath until this morning at approximately 3:34 oclock a.m. when she awoke with midsternal chest discomfort which was described as if "someone were sitting on me". She did get very short of breath with this episode. She took twonitroglycerin  at home; and subsequently, EMS was alerted. Enroute she was given two sublingual nitroglycerin as well as aspirin; and by the time she was brought her to the emergency room, her pain had eased; and she is currently pain free. t is of note she has missed her blood pressure medicines for the last two days. Her blood pressure was significantly elevated in the emergency room department. Otherwise, she has basically been doing well. No abdominal pain. No constipation. No diarrhea. No blood in the stool.  PHYSICAL EXAMINATION:  GENERAL:  She is currently pain free.  VITAL SIGNS:  Blood pressure initially 203/67, down to 165/62, heart rate is the 80s, respirations are 20, she is afebrile.  SKIN:  Warm and dry. Color is unremarkable.  LUNGS:   Slightly coarse but overall clear.  HEART:  Shows a regular rhythm.  ABDOMEN:  Soft. Positive bowel sounds. Nontender.  EXTREMITIES:  Without edema.  LABORATORY DATA:  Show an INR of 0.9, hematocrit is 36, white count is 4.7. Chemistries are all satisfactory except for glucose of 189, BUN just slightly elevated at 24, creatinine is 0.7, sodium 140, potassium 3.8, chloride 107, CO2 28, first CK and troponin are negative.  EKG showing no acute changes with normal sinus rhythm.  IMPRESSION:  A prolonged episode of chest pain with known atherosclerotic cardiovascular disease and prior myocardial infarction and stent deployment in 1996.  PLAN:  She will be admitted. We will to rule out myocardial infarction. Questionable catheterization versus Cardiolite scan. The further treatment plan to follow per Dr. Namon Cirri discretion. DD:  03/01/99 TD:  03/01/99 Job: [id number]

## 2010-08-06 NOTE — Op Note (Signed)
NAME:  [name], [name]                          ACCOUNT NO.:  [id number]   MEDICAL RECORD NO.:  [id number]                   PATIENT TYPE:  AMB   LOCATION:  ENDO                                 FACILITY:  [facility]   PHYSICIAN:  Bernette Redbird, M.D.                DATE OF BIRTH:  07-04-1927   DATE OF PROCEDURE:  08/06/2003  DATE OF DISCHARGE:                                 OPERATIVE REPORT   PROCEDURE PERFORMED:  Colonoscopy with biopsies.   ENDOSCOPIST:  Florencia Reasons, M.D.   INDICATIONS FOR PROCEDURE:  The patient is a 77 year old female with  functional diarrhea and need for colon cancer screening.   FINDINGS:  Sigmoid fixation with some diverticulosis.   DESCRIPTION OF PROCEDURE:  The nature, purpose and risks of the procedure  had been discussed with the patient, who provided written consent.  Sedation  was fentanyl 65 mcg and Versed 7 mg IV prior to and during the course of the  procedure without arrhythmias or desaturation.   The Olympus adjustable tension pediatric video colonoscope was advanced with  some difficulty through a fixated angulated sigmoid region where there was a  little bit of diverticulosis.  The patient had to be turned into the supine  position to facilitate advancement of the scope around the region of the  sigmoid to change the angle of the angulations and with that, the scope was  able to be advanced and then quite easily, the remainder of the distance to  the cecum as identified by transillumination of the right inguinal area deep  in the right lower quadrant as well as the absence of further lumen.  Pullback was then performed.  The quality of the prep was excellent and it  is felt that all areas were well seen.   There was limited sigmoid diverticulosis as well as the above-mentioned  sigmoid fixation and angulation, but no polyps, cancer, colitis or vascular  malformations were noted.  In particular, I did not see any source of the  patient's  diarrhea.  Retroflexion was not performed in the rectum but  antegrade viewing disclosed no significant abnormalities.   Random biopsies were obtained along the length of the colon due to ongoing  diarrhea.  Although the patient has hemorrhoidal complaints, I could not see  any excessive hemorrhoidal disease.   The patient tolerated the procedure well and there were no apparent  complications.   IMPRESSION:  1. Severe diarrhea without obvious cause noted on this exam, 564.5.  2. Colon cancer screening without neoplastic lesions found on current exam.   PLAN:  Await pathology results.                                               Bernette Redbird, M.D.  RB/MEDQ  D:  08/06/2003  T:  08/06/2003  Job:  [id number]   cc:   Birdena Jubilee, MD  [address]  [place]  [place] [address]  Fax: [phone]

## 2010-08-09 ENCOUNTER — Encounter (HOSPITAL_COMMUNITY): Payer: PRIVATE HEALTH INSURANCE

## 2010-08-11 ENCOUNTER — Encounter (HOSPITAL_COMMUNITY): Payer: PRIVATE HEALTH INSURANCE

## 2010-08-13 ENCOUNTER — Encounter (HOSPITAL_COMMUNITY): Payer: PRIVATE HEALTH INSURANCE

## 2010-08-16 ENCOUNTER — Encounter (HOSPITAL_COMMUNITY): Payer: PRIVATE HEALTH INSURANCE

## 2010-08-18 ENCOUNTER — Encounter (HOSPITAL_COMMUNITY): Payer: PRIVATE HEALTH INSURANCE

## 2010-08-20 ENCOUNTER — Encounter (HOSPITAL_COMMUNITY): Payer: PRIVATE HEALTH INSURANCE

## 2010-08-23 ENCOUNTER — Encounter (HOSPITAL_COMMUNITY): Payer: PRIVATE HEALTH INSURANCE

## 2010-08-25 ENCOUNTER — Encounter (HOSPITAL_COMMUNITY): Payer: PRIVATE HEALTH INSURANCE

## 2010-08-27 ENCOUNTER — Encounter (HOSPITAL_COMMUNITY): Payer: PRIVATE HEALTH INSURANCE

## 2010-08-30 ENCOUNTER — Encounter (HOSPITAL_COMMUNITY): Payer: PRIVATE HEALTH INSURANCE

## 2010-09-01 ENCOUNTER — Encounter (HOSPITAL_COMMUNITY): Payer: PRIVATE HEALTH INSURANCE

## 2010-09-03 ENCOUNTER — Encounter (HOSPITAL_COMMUNITY): Payer: PRIVATE HEALTH INSURANCE

## 2010-09-24 ENCOUNTER — Encounter: Payer: Self-pay | Admitting: Cardiology

## 2010-09-24 ENCOUNTER — Encounter: Payer: Self-pay | Admitting: Nurse Practitioner

## 2010-09-28 ENCOUNTER — Ambulatory Visit (INDEPENDENT_AMBULATORY_CARE_PROVIDER_SITE_OTHER): Payer: PRIVATE HEALTH INSURANCE | Admitting: Nurse Practitioner

## 2010-09-28 ENCOUNTER — Encounter: Payer: Self-pay | Admitting: Nurse Practitioner

## 2010-09-28 DIAGNOSIS — I2510 Atherosclerotic heart disease of native coronary artery without angina pectoris: Secondary | ICD-10-CM

## 2010-09-28 DIAGNOSIS — I10 Essential (primary) hypertension: Secondary | ICD-10-CM

## 2010-09-28 NOTE — Progress Notes (Signed)
[name] Date of Birth: Jul 20, 1927   History of Present Illness: [name] is seen back today for her 2 month visit. She is seen for Dr. Excell Seltzer. She is a former patient of Dr. Ronnald Nian. She is doing very well. She stopped all of her cardiac medicines several months ago. She does remain on her Plavix. She had 2 vessel PCI back in January by Dr Excell Seltzer. She had lots of GI issues from her Plavix and had significant weight loss. This has now improved. She is looking forward to stopping the Plavix in January. She does bruise easily. She monitors her blood pressure at home and has excellent control with basically no medicines. She is pretty content. She is back taking care of her "critters". She currently has 15 raccoons that she is caring for.   Current Outpatient Prescriptions on File Prior to Visit  Medication Sig Dispense Refill  . aspirin 81 MG tablet Take 81 mg by mouth daily.        . clopidogrel (PLAVIX) 75 MG tablet Take 75 mg by mouth daily.        . colesevelam (WELCHOL) 625 MG tablet Take 1,875 mg by mouth 2 (two) times daily.       Marland Kitchen ezetimibe-simvastatin (VYTORIN) 10-20 MG per tablet Take 1 tablet by mouth at bedtime.        . Liraglutide 18 MG/3ML SOLN Inject 1.2 mg into the skin daily.        Marland Kitchen omega-3 acid ethyl esters (LOVAZA) 1 G capsule Take 1 g by mouth 2 (two) times daily.       Marland Kitchen DISCONTD: pantoprazole (PROTONIX) 40 MG tablet Take 40 mg by mouth daily.          Allergies  Allergen Reactions  . Penicillins   . Sulfa Antibiotics     Past Medical History  Diagnosis Date  . Coronary artery disease     Remote PCI. Had BMS to RCA and DES stent to Diagonal in Jan. 2012   . Diabetes mellitus   . Diastolic heart failure     EF 55 to 60% per cath in Jan 2012  . Hyperlipidemia   . Hypertension   . Carotid bruit   . Gout   . Obesity   . TIA (transient ischemic attack)   . Chronic kidney disease     Stage 3  . Ischemic cardiomyopathy     with prior EF of 35%  .  NSTEMI (non-ST elevated myocardial infarction) Jan 2012  . Anemia     Past Surgical History  Procedure Date  . Appendectomy   . Tonsillectomy   . Left ankle fusion     History  Smoking status  . Former Smoker  . Quit date: 03/21/1968  Smokeless tobacco  . Never Used    History  Alcohol Use No    History reviewed. No pertinent family history.  Review of Systems: The review of systems is as above.  Appetite is good. No chest pain. Blood pressure is good. She is quite happy with how she currently feels. She has stopped her PPI therapy and has no current GI issues. She does bruise easily. All other systems were reviewed and are negative.  Physical Exam: BP 128/60  Pulse 74  Ht 5\' 5"  (1.651 m)  Wt 142 lb 6.4 oz (64.592 kg)  BMI 23.70 kg/m2 Patient is very pleasant and in no acute distress. Skin is warm and dry. Color is normal. She is ecchymotic.  HEENT is unremarkable. Normocephalic/atraumatic. PERRL. Sclera are nonicteric. Neck is supple. No masses. No JVD. Lungs are clear. Cardiac exam shows a regular rate and rhythm. Abdomen is soft. Extremities are without edema. Gait and ROM are intact. No gross neurologic deficits noted.  LABORATORY DATA:   Assessment / Plan:

## 2010-09-28 NOTE — Assessment & Plan Note (Signed)
She continues to do well despite being on no medicines except her Plavix and aspirin. She refuses to see anyone else. She wants to come back here in January. I will see her back then. Patient is agreeable to this plan and will call if any problems develop in the interim.

## 2010-09-28 NOTE — Assessment & Plan Note (Signed)
Blood pressure is good. She will continue to monitor.

## 2010-09-28 NOTE — Patient Instructions (Signed)
Stay with your current medicines I will see you back in about 4 months Call for any problems.

## 2010-12-24 LAB — CBC
HCT: 32.4 % — ABNORMAL LOW (ref 36.0–46.0)
HCT: 34.7 % — ABNORMAL LOW (ref 36.0–46.0)
Hemoglobin: 10.7 g/dL — ABNORMAL LOW (ref 12.0–15.0)
MCHC: 33.1 g/dL (ref 30.0–36.0)
MCHC: 33.7 g/dL (ref 30.0–36.0)
MCV: 92.7 fL (ref 78.0–100.0)
MCV: 93.4 fL (ref 78.0–100.0)
Platelets: 203 10*3/uL (ref 150–400)
RBC: 3.47 MIL/uL — ABNORMAL LOW (ref 3.87–5.11)
RBC: 3.75 MIL/uL — ABNORMAL LOW (ref 3.87–5.11)
WBC: 4.5 10*3/uL (ref 4.0–10.5)
WBC: 6.3 10*3/uL (ref 4.0–10.5)

## 2010-12-24 LAB — URINALYSIS, ROUTINE W REFLEX MICROSCOPIC
Ketones, ur: NEGATIVE mg/dL
Leukocytes, UA: NEGATIVE
Nitrite: NEGATIVE
Protein, ur: 100 mg/dL — AB

## 2010-12-24 LAB — COMPREHENSIVE METABOLIC PANEL
BUN: 13 mg/dL (ref 6–23)
CO2: 27 mEq/L (ref 19–32)
Calcium: 8.2 mg/dL — ABNORMAL LOW (ref 8.4–10.5)
Chloride: 112 mEq/L (ref 96–112)
Creatinine, Ser: 0.89 mg/dL (ref 0.4–1.2)
GFR calc Af Amer: >60 mL/min (ref >60)
GFR calc non Af Amer: >60 mL/min (ref >60)
Glucose, Bld: 143 mg/dL — ABNORMAL HIGH (ref 70–99)
Total Bilirubin: 0.7 mg/dL (ref 0.3–1.2)

## 2010-12-24 LAB — GLUCOSE, CAPILLARY
Glucose-Capillary: 109 mg/dL — ABNORMAL HIGH (ref 70–99)
Glucose-Capillary: 188 mg/dL — ABNORMAL HIGH (ref 70–99)
Glucose-Capillary: 52 mg/dL — ABNORMAL LOW (ref 70–99)
Glucose-Capillary: 70 mg/dL (ref 70–99)
Glucose-Capillary: 99 mg/dL (ref 70–99)

## 2010-12-24 LAB — D-DIMER, QUANTITATIVE: D-Dimer, Quant: 0.73 ug/mL-FEU — ABNORMAL HIGH (ref 0.00–0.48)

## 2010-12-24 LAB — DIFFERENTIAL
Basophils Absolute: 0 10*3/uL (ref 0.0–0.1)
Eosinophils Relative: 3 % (ref 0–5)
Lymphocytes Relative: 33 % (ref 12–46)
Lymphs Abs: 1.5 10*3/uL (ref 0.7–4.0)
Neutrophils Relative %: 55 % (ref 43–77)

## 2010-12-24 LAB — HEPARIN LEVEL (UNFRACTIONATED): Heparin Unfractionated: 0.2 IU/mL — ABNORMAL LOW (ref 0.30–0.70)

## 2010-12-24 LAB — PROTIME-INR
INR: 1.2 (ref 0.00–1.49)
Prothrombin Time: 15.5 seconds — ABNORMAL HIGH (ref 11.6–15.2)

## 2011-02-23 ENCOUNTER — Other Ambulatory Visit: Payer: Self-pay | Admitting: *Deleted

## 2011-02-23 MED ORDER — CLOPIDOGREL BISULFATE 75 MG PO TABS: 75.0000 mg | ORAL_TABLET | Freq: Every day | ORAL | Status: DC

## 2011-03-21 ENCOUNTER — Other Ambulatory Visit: Payer: Self-pay | Admitting: *Deleted

## 2011-03-21 MED ORDER — NITROGLYCERIN 0.4 MG SL SUBL: 0.4000 mg | SUBLINGUAL_TABLET | SUBLINGUAL | Status: DC | PRN

## 2011-03-22 DIAGNOSIS — I639 Cerebral infarction, unspecified: Secondary | ICD-10-CM

## 2011-03-22 HISTORY — DX: Cerebral infarction, unspecified: I63.9

## 2011-03-28 ENCOUNTER — Ambulatory Visit (INDEPENDENT_AMBULATORY_CARE_PROVIDER_SITE_OTHER): Payer: PRIVATE HEALTH INSURANCE | Admitting: Nurse Practitioner

## 2011-03-28 ENCOUNTER — Encounter: Payer: Self-pay | Admitting: Nurse Practitioner

## 2011-03-28 VITALS — BP 132/64 | HR 76 | Ht 61.0 in | Wt 139.6 lb

## 2011-03-28 DIAGNOSIS — I2510 Atherosclerotic heart disease of native coronary artery without angina pectoris: Secondary | ICD-10-CM

## 2011-03-28 DIAGNOSIS — J069 Acute upper respiratory infection, unspecified: Secondary | ICD-10-CM | POA: Insufficient documentation

## 2011-03-28 MED ORDER — AZITHROMYCIN 250 MG PO TABS: ORAL_TABLET | ORAL | Status: AC

## 2011-03-28 NOTE — Progress Notes (Signed)
[name] Date of Birth: 04/04/1927 Medical Record #[id number]  History of Present Illness: [name] is seen back today for her 6 month visit. She is seen for Dr. Excell Seltzer. She is a former patient of Dr. Ronnald Nian. She had 2 vessel PCI back in January by Dr. Excell Seltzer. She stopped most of her cardiac medicines last year and has only been taking aspirin and Plavix with her cholesterol medicines.  She had lots of GI issues from her Plavix and had significant weight loss which was improved at the last visit.   She comes in today. She is doing well. Remains busy caring for her "critters". Has had two of her raccoons die and that has upset her greatly. She has her labs done by her PCP. Remains on her cholesterol medicines and her insulin shot. No chest pain. Not short of breath. Anxious to get off of her Plavix. Does have some bruising.   Current Outpatient Prescriptions on File Prior to Visit  Medication Sig Dispense Refill  . aspirin 81 MG tablet Take 81 mg by mouth daily.        . clopidogrel (PLAVIX) 75 MG tablet Take 1 tablet (75 mg total) by mouth daily.  30 tablet  5  . colesevelam (WELCHOL) 625 MG tablet Take 1,875 mg by mouth 2 (two) times daily.       Marland Kitchen ezetimibe-simvastatin (VYTORIN) 10-20 MG per tablet Take 1 tablet by mouth at bedtime.        . Liraglutide 18 MG/3ML SOLN Inject 1.2 mg into the skin daily.        Marland Kitchen omega-3 acid ethyl esters (LOVAZA) 1 G capsule Take 1 g by mouth 2 (two) times daily.       . nitroGLYCERIN (NITROSTAT) 0.4 MG SL tablet Place 1 tablet (0.4 mg total) under the tongue every 5 (five) minutes as needed.  25 tablet  11    Allergies  Allergen Reactions  . Penicillins   . Sulfa Antibiotics     Past Medical History  Diagnosis Date  . Coronary artery disease     Remote PCI. Had BMS to RCA and DES stent to Diagonal in Jan. 2012   . Diabetes mellitus   . Diastolic heart failure     EF 55 to 60% per cath in Jan 2012  . Hyperlipidemia   . Hypertension   .  Carotid bruit   . Gout   . Obesity   . TIA (transient ischemic attack)   . Chronic kidney disease     Stage 3  . Ischemic cardiomyopathy     with prior EF of 35%  . NSTEMI (non-ST elevated myocardial infarction) Jan 2012  . Anemia     Past Surgical History  Procedure Date  . Appendectomy   . Tonsillectomy   . Left ankle fusion     History  Smoking status  . Former Smoker  . Quit date: 03/21/1968  Smokeless tobacco  . Never Used    History  Alcohol Use No    History reviewed. No pertinent family history.  Review of Systems: The review of systems is positive for bruising.  No CHF symptoms. No cardiac symptoms. She has had a cold for the last several weeks and wants some antibiotics. Sputum and nasal drainage is yellow and has been persistent. All other systems were reviewed and are negative.  Physical Exam: BP 132/64  Pulse 76  Ht 5\' 1"  (1.549 m)  Wt 139 lb 9.6 oz (63.322 kg)  BMI 26.38 kg/m2 Patient is very pleasant and in no acute distress. Skin is warm and dry. Color is normal.  Some bruising on her hands notes. HEENT is unremarkable. Normocephalic/atraumatic. PERRL. Sclera are nonicteric. Neck is supple. No masses. No JVD. Lungs are clear. Cardiac exam shows a regular rate and rhythm. Abdomen is soft. Extremities are without edema. Gait and ROM are intact. No gross neurologic deficits noted.  LABORATORY DATA: N/A   Assessment / Plan:

## 2011-03-28 NOTE — Assessment & Plan Note (Signed)
I have given her a Z pak as directed.

## 2011-03-28 NOTE — Assessment & Plan Note (Signed)
She is one year out from her NSTEMI with BMS to the RCA and DES to the diagonal. I have stopped her Plavix. She is doing well clinically. She is not interested in any testing. Labs are done by her PCP. I will see her back in 6 months. She does not wish to see anyone else at this time. Patient is agreeable to this plan and will call if any problems develop in the interim.

## 2011-03-28 NOTE — Patient Instructions (Signed)
You may stop your Plavix.  Stay on your aspirin and your other medicines.  I will see you in 6 months.  I will give you a Zpak to help with your cold.  Call the [facility] office at [phone] if you have any questions, problems or concerns.

## 2011-05-06 ENCOUNTER — Encounter (INDEPENDENT_AMBULATORY_CARE_PROVIDER_SITE_OTHER): Payer: PRIVATE HEALTH INSURANCE | Admitting: Ophthalmology

## 2011-05-06 DIAGNOSIS — H2700 Aphakia, unspecified eye: Secondary | ICD-10-CM

## 2011-05-06 DIAGNOSIS — H43819 Vitreous degeneration, unspecified eye: Secondary | ICD-10-CM

## 2011-05-06 DIAGNOSIS — E11319 Type 2 diabetes mellitus with unspecified diabetic retinopathy without macular edema: Secondary | ICD-10-CM

## 2011-05-06 DIAGNOSIS — H1045 Other chronic allergic conjunctivitis: Secondary | ICD-10-CM

## 2011-05-17 ENCOUNTER — Encounter (HOSPITAL_BASED_OUTPATIENT_CLINIC_OR_DEPARTMENT_OTHER): Payer: PRIVATE HEALTH INSURANCE | Attending: General Surgery

## 2011-05-17 DIAGNOSIS — L03119 Cellulitis of unspecified part of limb: Secondary | ICD-10-CM | POA: Insufficient documentation

## 2011-05-17 DIAGNOSIS — L02419 Cutaneous abscess of limb, unspecified: Secondary | ICD-10-CM | POA: Insufficient documentation

## 2011-05-17 DIAGNOSIS — Z7982 Long term (current) use of aspirin: Secondary | ICD-10-CM | POA: Insufficient documentation

## 2011-05-17 DIAGNOSIS — E119 Type 2 diabetes mellitus without complications: Secondary | ICD-10-CM | POA: Insufficient documentation

## 2011-05-17 NOTE — Progress Notes (Signed)
[facility]  NAME:  [name], FRESE NO.:  [id number]  MEDICAL RECORD NO.:  [id number]      DATE OF BIRTH:  1928/01/29  PHYSICIAN:  Joanne Gavel, M.D.         VISIT DATE:  05/17/2011                                  OFFICE VISIT   CHIEF COMPLAINT:  Pain right foreleg.  HISTORY OF PRESENT ILLNESS:  This is an 78 year old female, diabetic for many years, non-insulin dependent, developed pain, redness in right foreleg after scratch.  The scratch has healed up but the pain and redness is increasing particular in the last 10 days.  There has been no fever, chills, or sweating spells.  No cough, chest pain, or pleuritic symptoms.  PAST MEDICAL HISTORY:  Significant for no heart disease, lung disease, kidney disease, she may have had a mini stroke, but has no sequela.  MEDICATIONS:  She takes aspirin 1 pill for diabetes, several vitamins.  ALLERGIES: 1. PENICILLIN. 2. SULFA.  SOCIAL HISTORY:  Cigarettes none.  Alcohol, none.  PAST SURGICAL HISTORY:  Tonsillectomy, appendectomy, ankle leg surgery and several stents for peripheral vascular disease.  She also has a history of a right foot ulcer which was treated with HBO in the past.  PHYSICAL EXAMINATION:  GENERAL APPEARANCE: Awake alert, in no distress, somewhat hard of hearing.  Well developed. VITAL SIGNS: Temperature 98.6, pulse 79, respirations 18, blood pressure 153/72 CHEST: Clear. HEART: Regular rhythm. ABDOMEN: Not examined. EXTREMITIES: Examination of the right lower extremity reveals pulseless leg with what appears to be adequate skin nutrition.  No loss of sensation or movement.  Over the medial aspect of the right foreleg, there is a patch of redness approximately 6 x 8.  This is extremely tender and slightly swollen.  There is also some pretibial edema.  IMPRESSION:  Subacute cellulitis with no evidence of skin breakdown.  PLAN OF TREATMENT:  Oral antibiotics.  If the  situation worsens, the patient will call, and we will see her in 7 days.     Joanne Gavel, M.D.     RA/MEDQ  D:  05/17/2011  T:  05/17/2011  Job:  [id number]

## 2011-05-20 ENCOUNTER — Telehealth: Payer: Self-pay | Admitting: *Deleted

## 2011-05-20 NOTE — Telephone Encounter (Signed)
Called Ms. Vonderhaar about her request for a [facility] , near the [address] area. There was one office near that area call [facility]. After I spoke with Aundra Millet, and she informed me that Ms. Pavlovich did not need to make an appointment , she could walk in on any day except Wednesday between the hours of 8:30-5 pm and Saturday 8:30-1 pm. The information was given to Ms. Montez Morita and she stated that she would see if there was another office near [address] and she would call Lawson Fiscal to give her the information if a referral was needed. The number for [facility] [phone].

## 2011-05-25 ENCOUNTER — Encounter (HOSPITAL_BASED_OUTPATIENT_CLINIC_OR_DEPARTMENT_OTHER): Payer: PRIVATE HEALTH INSURANCE | Attending: General Surgery

## 2011-05-25 DIAGNOSIS — I739 Peripheral vascular disease, unspecified: Secondary | ICD-10-CM | POA: Insufficient documentation

## 2011-05-25 DIAGNOSIS — Z8673 Personal history of transient ischemic attack (TIA), and cerebral infarction without residual deficits: Secondary | ICD-10-CM | POA: Insufficient documentation

## 2011-05-25 DIAGNOSIS — E1169 Type 2 diabetes mellitus with other specified complication: Secondary | ICD-10-CM | POA: Insufficient documentation

## 2011-05-25 DIAGNOSIS — L97809 Non-pressure chronic ulcer of other part of unspecified lower leg with unspecified severity: Secondary | ICD-10-CM | POA: Insufficient documentation

## 2011-05-25 NOTE — Progress Notes (Signed)
[facility]  NAME:  [name], [name] NO.:  [id number]  MEDICAL RECORD NO.:  [id number]      DATE OF BIRTH:  1927/08/06  PHYSICIAN:  Wayland Denis, DO       VISIT DATE:  05/25/2011                                  OFFICE VISIT   [name] is here for followup on her right lower extremity ulcer.  She was seen last week and started on antibiotics because she had cellulitis of the anterior lower leg.  It seems to have helped the cellulitis but she has a little bit of skin breakdown anterior superiorly and tenderness.  She brought some of her medications but not all of them. There has been no recent change just the addition of the Keflex. Nothing seems to help the pain at this point, but the overall redness is improved.  She has a history of peripheral vascular disease, high blood pressure, ulcerations, diabetes, and even TIAs.  Social history is unchanged.  On exam, she is alert, oriented, cooperative, not in any acute distress. She is pleasant.  Pupils are equal.  Extraocular muscles are intact.  No cervical lymphadenopathy.  Her heart rate is regular.  Her breathing is unlabored.  Her abdomen is soft.  She has tenderness in both lower extremities with more swelling in the right.  Her pulse is present but weak.  There is redness and slight warmth but overall improvement.  We would like to do a Profore Lite wrap which she has agreed to with silver and zinc.  We are going to try that for the week and see her back in 1 week.     Wayland Denis, DO     CS/MEDQ  D:  05/25/2011  T:  05/25/2011  Job:  [id number]

## 2011-06-01 NOTE — Progress Notes (Signed)
[facility]  NAME:  [name], [name] NO.:  MEDICAL RECORD NO.:  [id number]      DATE OF BIRTH:  [birth year]  PHYSICIAN:  Wayland Denis, DO       VISIT DATE:  06/01/2011                                  OFFICE VISIT   [name] is an 78 year old female who is here for followup on a diabetic foot ulcer.  He has been using Profore Lite, zinc, and Silvercel.  She has actually done extremely well.  The skin is healing up.  There is less redness and less swelling.  She is very pleased with the results.  There does not seem to be any weeping of the skin.  There is no change in her medications or social history.  EXAM:  GENERAL: She is alert, oriented, cooperative, not in any acute distress.  She is very pleasant. HEENT: Pupils are equal.  Extraocular muscles are intact. NECK: No cervical lymphadenopathy. RESPIRATORY: Her breathing is unlabored.  HEART: Regular. ABDOMEN: Soft.  The wound is noted above and as described. We will continue with zinc, Silvercel, Profore Lite, and have her followup in 1 week.     Wayland Denis, DO     CS/MEDQ  D:  06/01/2011  T:  06/01/2011  Job:  [id number]

## 2011-06-02 ENCOUNTER — Encounter (INDEPENDENT_AMBULATORY_CARE_PROVIDER_SITE_OTHER): Payer: PRIVATE HEALTH INSURANCE | Admitting: Ophthalmology

## 2011-06-09 NOTE — Progress Notes (Signed)
[facility]  NAME:  [name], [name] NO.:  MEDICAL RECORD NO.:  [id number]      DATE OF BIRTH:  09-24-1927  PHYSICIAN:  Wayland Denis, DO       VISIT DATE:  06/08/2011                                  OFFICE VISIT   [name] is an 78 year old female, dealing with a right lower extremity ulcer.  We were using zinc, Profore Lite, and Silvercel.  She has done extremely well over this last week with marked improvement in complete healing of the area.  There has been no change in her medications.  Her social history is unchanged.  PHYSICAL EXAMINATION:  GENERAL:  She is very pleasant, alert, oriented, and cooperative, not in any acute distress. HEENT:  Pupils are equal.  Extraocular muscles are intact. NECK:  No cervical lymphadenopathy. CHEST:  Breathing is unlabored. HEART:  Regular. ABDOMEN:  Soft. EXTREMITIES:  The leg has healed and epithelialized.  She still has a some slight red areas that have weak skin, but overall she has healed.  RECOMMENDATIONS:  Continue with lotion, gauze, and Ace wrap, and travel sock as able, and we will see her back if needed.     Wayland Denis, DO     CS/MEDQ  D:  06/08/2011  T:  06/09/2011  Job:  [id number]

## 2011-06-14 ENCOUNTER — Encounter (HOSPITAL_BASED_OUTPATIENT_CLINIC_OR_DEPARTMENT_OTHER): Payer: PRIVATE HEALTH INSURANCE

## 2011-08-03 ENCOUNTER — Encounter (INDEPENDENT_AMBULATORY_CARE_PROVIDER_SITE_OTHER): Payer: PRIVATE HEALTH INSURANCE | Admitting: Ophthalmology

## 2011-08-04 ENCOUNTER — Other Ambulatory Visit: Payer: Self-pay | Admitting: Internal Medicine

## 2011-08-04 DIAGNOSIS — Z780 Asymptomatic menopausal state: Secondary | ICD-10-CM

## 2011-08-17 ENCOUNTER — Encounter (INDEPENDENT_AMBULATORY_CARE_PROVIDER_SITE_OTHER): Payer: PRIVATE HEALTH INSURANCE | Admitting: Ophthalmology

## 2011-08-17 DIAGNOSIS — E1165 Type 2 diabetes mellitus with hyperglycemia: Secondary | ICD-10-CM

## 2011-08-17 DIAGNOSIS — H43819 Vitreous degeneration, unspecified eye: Secondary | ICD-10-CM

## 2011-08-17 DIAGNOSIS — E11319 Type 2 diabetes mellitus with unspecified diabetic retinopathy without macular edema: Secondary | ICD-10-CM

## 2011-08-17 DIAGNOSIS — H3581 Retinal edema: Secondary | ICD-10-CM

## 2011-08-18 ENCOUNTER — Other Ambulatory Visit: Payer: PRIVATE HEALTH INSURANCE

## 2011-08-19 ENCOUNTER — Ambulatory Visit
Admission: RE | Admit: 2011-08-19 | Discharge: 2011-08-19 | Disposition: A | Discharge status: Home / Self Care | Payer: PRIVATE HEALTH INSURANCE | Source: Ambulatory Visit | Attending: Internal Medicine | Admitting: Internal Medicine

## 2011-08-19 DIAGNOSIS — Z780 Asymptomatic menopausal state: Secondary | ICD-10-CM

## 2011-08-19 LAB — HM DEXA SCAN

## 2011-10-20 ENCOUNTER — Encounter: Payer: Self-pay | Admitting: Nurse Practitioner

## 2011-10-20 ENCOUNTER — Ambulatory Visit (INDEPENDENT_AMBULATORY_CARE_PROVIDER_SITE_OTHER): Payer: PRIVATE HEALTH INSURANCE | Admitting: Nurse Practitioner

## 2011-10-20 VITALS — BP 138/62 | HR 75 | Ht 60.0 in | Wt 136.0 lb

## 2011-10-20 DIAGNOSIS — I2510 Atherosclerotic heart disease of native coronary artery without angina pectoris: Secondary | ICD-10-CM

## 2011-10-20 DIAGNOSIS — N189 Chronic kidney disease, unspecified: Secondary | ICD-10-CM

## 2011-10-20 DIAGNOSIS — I10 Essential (primary) hypertension: Secondary | ICD-10-CM

## 2011-10-20 NOTE — Progress Notes (Signed)
[name] Date of Birth: 1928-03-21 Medical Record #[id number]  History of Present Illness: [name] is seen back today for her 6 month check. She is seen for Dr. Excell Seltzer. She has multiple medical issues which include CAD, remote PCI, BMS to the RCA and DES to the DX in January of 2012. Other problems include long standing diabetes, HTN, HLD, diastolic heart failure with EF up to 55 to 60% per last cath in 2012, PVD, gout, CKD and chronic anemia.   She comes in today. She is here with her daughter, [name]. Levia is doing well. Has no cardiac complaints. Now seeing a physician at [facility]. Care for her primary care. Has left Dr. Alberteen Sam. Still trying to get her records transferred. She has had her labs done by her new PCP. Has been referred to nephrology. No chest pain. Not short of breath. Remains constipated. Not dizzy or lightheaded. She stays busy caring for her rescue "critters".   Current Outpatient Prescriptions on File Prior to Visit  Medication Sig Dispense Refill  . aspirin 81 MG tablet Take 81 mg by mouth daily.        . cloNIDine (CATAPRES) 0.1 MG tablet Take 0.1 mg by mouth 2 (two) times daily.       . colesevelam (WELCHOL) 625 MG tablet Take 1,875 mg by mouth 2 (two) times daily.       Marland Kitchen ezetimibe-simvastatin (VYTORIN) 10-20 MG per tablet Take 1 tablet by mouth at bedtime.        . Liraglutide 18 MG/3ML SOLN Inject 1.2 mg into the skin daily.        . nitroGLYCERIN (NITROSTAT) 0.4 MG SL tablet Place 1 tablet (0.4 mg total) under the tongue every 5 (five) minutes as needed.  25 tablet  11  . omega-3 acid ethyl esters (LOVAZA) 1 G capsule Take 1 g by mouth 2 (two) times daily.         Allergies  Allergen Reactions  . Penicillins   . Sulfa Antibiotics     Past Medical History  Diagnosis Date  . Coronary artery disease     Remote PCI. Had BMS to RCA and DES stent to Diagonal in Jan. 2012   . Diabetes mellitus   . Diastolic heart failure     EF 55 to 60% per cath in Jan  2012  . Hyperlipidemia   . Hypertension   . Carotid bruit   . Gout   . Obesity   . TIA (transient ischemic attack)   . Chronic kidney disease     Stage 3  . Ischemic cardiomyopathy     with prior EF of 35%  . NSTEMI (non-ST elevated myocardial infarction) Jan 2012  . Anemia     Past Surgical History  Procedure Date  . Appendectomy   . Tonsillectomy   . Left ankle fusion     History  Smoking status  . Former Smoker  . Quit date: 03/21/1968  Smokeless tobacco  . Never Used    History  Alcohol Use No    No family history on file.  Review of Systems: The review of systems is per the HPI.  All other systems were reviewed and are negative.  Physical Exam: BP 138/62  Pulse 75  Ht 5' (1.524 m)  Wt 136 lb (61.689 kg)  BMI 26.56 kg/m2  SpO2 94% Weight is down 3 pounds since January.  Patient is very pleasant and in no acute distress. Skin is warm and dry.  Color is normal.  HEENT is unremarkable. Normocephalic/atraumatic. PERRL. Sclera are nonicteric. Neck is supple. No masses. No JVD. Lungs are clear. Cardiac exam shows a regular rate and rhythm. Abdomen is soft. Extremities are without edema. She has support stockings in place.  Gait and ROM are intact. No gross neurologic deficits noted.   LABORATORY DATA: N/A   Assessment / Plan:

## 2011-10-20 NOTE — Patient Instructions (Signed)
Stay on your current medicines  I will see you in 6 months  Call the [facility] office at [phone] if you have any questions, problems or concerns.

## 2011-10-20 NOTE — Assessment & Plan Note (Signed)
Now being followed by [facility].

## 2011-10-20 NOTE — Assessment & Plan Note (Signed)
Has been referred to nephrology.

## 2011-10-20 NOTE — Assessment & Plan Note (Signed)
Blood pressure is doing ok. No change in her current therapy.

## 2011-10-20 NOTE — Assessment & Plan Note (Signed)
She is doing well from our standpoint. No change in her current therapy. Will plan on seeing her back in 6 months. She has had her labs from her PCP. Patient is agreeable to this plan and will call if any problems develop in the interim.

## 2011-10-20 NOTE — Assessment & Plan Note (Signed)
She looks compensated. No change in her current medicines.

## 2011-12-08 ENCOUNTER — Other Ambulatory Visit: Payer: Self-pay | Admitting: Internal Medicine

## 2011-12-08 DIAGNOSIS — G8929 Other chronic pain: Secondary | ICD-10-CM

## 2011-12-12 ENCOUNTER — Ambulatory Visit
Admission: RE | Admit: 2011-12-12 | Discharge: 2011-12-12 | Disposition: A | Discharge status: Home / Self Care | Payer: PRIVATE HEALTH INSURANCE | Source: Ambulatory Visit | Attending: Internal Medicine | Admitting: Internal Medicine

## 2011-12-12 DIAGNOSIS — G8929 Other chronic pain: Secondary | ICD-10-CM

## 2011-12-19 ENCOUNTER — Ambulatory Visit (INDEPENDENT_AMBULATORY_CARE_PROVIDER_SITE_OTHER): Payer: PRIVATE HEALTH INSURANCE | Admitting: Ophthalmology

## 2011-12-19 DIAGNOSIS — E1139 Type 2 diabetes mellitus with other diabetic ophthalmic complication: Secondary | ICD-10-CM

## 2011-12-19 DIAGNOSIS — E11319 Type 2 diabetes mellitus with unspecified diabetic retinopathy without macular edema: Secondary | ICD-10-CM

## 2011-12-19 DIAGNOSIS — H3581 Retinal edema: Secondary | ICD-10-CM

## 2011-12-19 DIAGNOSIS — H43819 Vitreous degeneration, unspecified eye: Secondary | ICD-10-CM

## 2011-12-20 ENCOUNTER — Other Ambulatory Visit: Payer: Self-pay

## 2011-12-20 DIAGNOSIS — I739 Peripheral vascular disease, unspecified: Secondary | ICD-10-CM

## 2011-12-21 ENCOUNTER — Encounter: Payer: Self-pay | Admitting: Vascular Surgery

## 2011-12-22 ENCOUNTER — Encounter (INDEPENDENT_AMBULATORY_CARE_PROVIDER_SITE_OTHER): Payer: PRIVATE HEALTH INSURANCE | Admitting: *Deleted

## 2011-12-22 ENCOUNTER — Encounter (HOSPITAL_COMMUNITY): Payer: Self-pay | Admitting: Family Medicine

## 2011-12-22 ENCOUNTER — Encounter: Payer: Self-pay | Admitting: Vascular Surgery

## 2011-12-22 ENCOUNTER — Emergency Department (HOSPITAL_COMMUNITY)
Admission: EM | Admit: 2011-12-22 | Discharge: 2011-12-22 | Disposition: A | Discharge status: Home / Self Care | Payer: PRIVATE HEALTH INSURANCE | Attending: Emergency Medicine | Admitting: Emergency Medicine

## 2011-12-22 ENCOUNTER — Ambulatory Visit (INDEPENDENT_AMBULATORY_CARE_PROVIDER_SITE_OTHER): Payer: PRIVATE HEALTH INSURANCE | Admitting: Vascular Surgery

## 2011-12-22 VITALS — BP 174/46 | HR 56 | Temp 97.7°F | Ht 60.0 in | Wt 136.0 lb

## 2011-12-22 DIAGNOSIS — I2510 Atherosclerotic heart disease of native coronary artery without angina pectoris: Secondary | ICD-10-CM | POA: Insufficient documentation

## 2011-12-22 DIAGNOSIS — E119 Type 2 diabetes mellitus without complications: Secondary | ICD-10-CM | POA: Insufficient documentation

## 2011-12-22 DIAGNOSIS — Z882 Allergy status to sulfonamides status: Secondary | ICD-10-CM | POA: Insufficient documentation

## 2011-12-22 DIAGNOSIS — I739 Peripheral vascular disease, unspecified: Secondary | ICD-10-CM

## 2011-12-22 DIAGNOSIS — W5551XA Bitten by raccoon, initial encounter: Secondary | ICD-10-CM

## 2011-12-22 DIAGNOSIS — I749 Embolism and thrombosis of unspecified artery: Secondary | ICD-10-CM

## 2011-12-22 DIAGNOSIS — Z880 Allergy status to penicillin: Secondary | ICD-10-CM | POA: Insufficient documentation

## 2011-12-22 DIAGNOSIS — N183 Chronic kidney disease, stage 3 unspecified: Secondary | ICD-10-CM | POA: Insufficient documentation

## 2011-12-22 DIAGNOSIS — I7090 Unspecified atherosclerosis: Secondary | ICD-10-CM | POA: Insufficient documentation

## 2011-12-22 DIAGNOSIS — I129 Hypertensive chronic kidney disease with stage 1 through stage 4 chronic kidney disease, or unspecified chronic kidney disease: Secondary | ICD-10-CM | POA: Insufficient documentation

## 2011-12-22 DIAGNOSIS — Z23 Encounter for immunization: Secondary | ICD-10-CM | POA: Insufficient documentation

## 2011-12-22 DIAGNOSIS — S61409A Unspecified open wound of unspecified hand, initial encounter: Secondary | ICD-10-CM | POA: Insufficient documentation

## 2011-12-22 DIAGNOSIS — E669 Obesity, unspecified: Secondary | ICD-10-CM | POA: Insufficient documentation

## 2011-12-22 DIAGNOSIS — Z87891 Personal history of nicotine dependence: Secondary | ICD-10-CM | POA: Insufficient documentation

## 2011-12-22 DIAGNOSIS — I6529 Occlusion and stenosis of unspecified carotid artery: Secondary | ICD-10-CM

## 2011-12-22 DIAGNOSIS — Z7982 Long term (current) use of aspirin: Secondary | ICD-10-CM | POA: Insufficient documentation

## 2011-12-22 DIAGNOSIS — IMO0001 Reserved for inherently not codable concepts without codable children: Secondary | ICD-10-CM | POA: Insufficient documentation

## 2011-12-22 MED ORDER — RABIES VACCINE, PCEC IM SUSR
1.0000 mL | Freq: Once | INTRAMUSCULAR | Status: AC
  Administered 2011-12-22: 1 mL via INTRAMUSCULAR
  Filled 2011-12-22: qty 1

## 2011-12-22 NOTE — ED Notes (Signed)
Pt bit on left hand by a racoon. sts she got it off the side of the road and was paralyzed. sts she was feeding it and it bit her.

## 2011-12-22 NOTE — Progress Notes (Signed)
[facility] [place] HISTORY AND PHYSICAL   History of Present Illness:  Patient is a 78 y.o. year old female who presents for evaluation of bilateral lower extremity pain. The patient states she has had several episodes over the last few months where pain starts in her feet and radiates all the way to her pelvis giving her a "fire-like sensation". This is not associated with ambulation. It has been completely random and has occurred in a standing position. He has occurred sometimes when walking. He has also occurred while lying in bed in the evening. She denies any classic claudication symptoms. According to her she can walk all day long. She was previously seen by my partner Dr. Arbie Cookey in 2007 for a nonhealing ulcer on the right leg. This has now spontaneously healed. I reviewed her ABIs from 2011 which were 0.56 bilaterally. She had monophasic tibial waveforms at that time..  Other medical problems include  CKD 3, coronary artery disease status post multiple stents, diabetes, diastolic heart failure, hyperlipidemia, hypertension, carotid bruit, gout, remote TIA, prior myocardial infarction. These problems are all currently stable.  She did have what sounds like fairly severe bilateral active: Fractures several years ago. She states that the right leg has not really been perfect since then and frequently has restless symptoms.  Past Medical History  Diagnosis Date  . Coronary artery disease     Remote PCI. Had BMS to RCA and DES stent to Diagonal in Jan. 2012   . Diabetes mellitus     long standing  . Diastolic heart failure     EF 55 to 60% per cath in Jan 2012  . Hyperlipidemia   . Hypertension   . Carotid bruit   . Gout   . Obesity   . TIA (transient ischemic attack)   . Chronic kidney disease     Stage 3  . Ischemic cardiomyopathy     with prior EF of 35%  . NSTEMI (non-ST elevated myocardial infarction) Jan 2012    with PCI to RCA and DX per Dr. Excell Seltzer  . Anemia     . Proteinuria     Past Surgical History  Procedure Date  . Appendectomy   . Tonsillectomy   . Left ankle fusion   . Distal rca 04/05/2010    stent     Social History History  Substance Use Topics  . Smoking status: Former Smoker    Quit date: 03/21/1968  . Smokeless tobacco: Never Used  . Alcohol Use: No    Family History History reviewed. No pertinent family history.  Allergies  Allergies  Allergen Reactions  . Penicillins   . Sulfa Antibiotics      Current Outpatient Prescriptions  Medication Sig Dispense Refill  . aspirin 81 MG tablet Take 81 mg by mouth daily.        Marland Kitchen BROMDAY 0.09 % SOLN Place 1 drop into the left eye daily.      . carvedilol (COREG) 6.25 MG tablet Take 3.125 mg by mouth 2 (two) times daily with a meal.       . cloNIDine (CATAPRES) 0.1 MG tablet Take 0.1 mg by mouth 2 (two) times daily.       . colesevelam (WELCHOL) 625 MG tablet Take 1,875 mg by mouth 2 (two) times daily.       . Dietary Management Product (VASCULERA PO) Take by mouth daily.      . ergocalciferol (VITAMIN D2) 50000 UNITS capsule Take 50,000 Units  by mouth once a week.      . ezetimibe-simvastatin (VYTORIN) 10-20 MG per tablet Take 1 tablet by mouth at bedtime.        . Liraglutide 18 MG/3ML SOLN Inject 1.2 mg into the skin daily.        Marland Kitchen lubiprostone (AMITIZA) 8 MCG capsule Take 8 mcg by mouth 2 (two) times daily with a meal.      . nitroGLYCERIN (NITROSTAT) 0.4 MG SL tablet Place 1 tablet (0.4 mg total) under the tongue every 5 (five) minutes as needed.  25 tablet  11  . omega-3 acid ethyl esters (LOVAZA) 1 G capsule Take 1 g by mouth 2 (two) times daily.       Marland Kitchen CALCIUM & MAGNESIUM CARBONATES PO Take 2 capsules by mouth daily.      Marland Kitchen LOTEMAX 0.5 % ophthalmic suspension       . NON FORMULARY Vasculara 630 mg qd      . NOVOFINE 32G X 6 MM MISC         ROS:   General:  No weight loss, Fever, chills  HEENT: No recent headaches, no nasal bleeding, no visual changes, no  sore throat  Neurologic: No dizziness, blackouts, seizures. No recent symptoms of stroke or mini- stroke. No recent episodes of slurred speech, or temporary blindness.  Cardiac: No recent episodes of chest pain/pressure, no shortness of breath at rest.  No shortness of breath with exertion.  Denies history of atrial fibrillation or irregular heartbeat  Vascular: No history of rest pain in feet.  No history of claudication.  + history of non-healing ulcer, No history of DVT   Pulmonary: No home oxygen, no productive cough, no hemoptysis,  No asthma or wheezing  Musculoskeletal:  [ ]  Arthritis, [ ]  Low back pain,  [x ] Joint pain  Hematologic:No history of hypercoagulable state.  No history of easy bleeding.  No history of anemia  Gastrointestinal: No hematochezia or melena,  No gastroesophageal reflux, no trouble swallowing  Urinary: [x ] chronic Kidney disease, [ ]  on HD - [ ]  MWF or [ ]  TTHS, [ ]  Burning with urination, [ ]  Frequent urination, [ ]  Difficulty urinating;   Skin: No rashes  Psychological: No history of anxiety,  No history of depression   Physical Examination  Filed Vitals:   12/22/11 1032  BP: 174/46  Pulse: 56  Temp: 97.7 F (36.5 C)  TempSrc: Oral  Height: 5' (1.524 m)  Weight: 136 lb (61.689 kg)  SpO2: 98%    Body mass index is 26.56 kg/(m^2).  General:  Alert and oriented, no acute distress HEENT: Normal Neck: Bilateral bruit left greater than right no JVD Pulmonary: Clear to auscultation bilaterally Cardiac: Regular Rate and Rhythm without murmur Abdomen: Soft, non-tender, non-distended, no mass Skin: No rash, she has thickened skin in the calf region just above the medial malleolus bilaterally. This is tender to palpation. She has dry scaly skin on her feet bilaterally with some loss of hair, no ulcerations Extremity Pulses:  2+ radial, brachial, femoral, absent dorsalis pedis, posterior tibial pulses bilaterally Musculoskeletal: Some shortening of  the right leg compared to the left from previous ankle fracture  Neurologic: Upper and lower extremity motor 5/5 and symmetric  DATA: I reviewed her ABIs from 2011 as well as her ABIs from September 23 of this year done a [facility] radiology. Her most recent ABI was 0.4 on the right 0.6 on the left this is compared to 0.56 bilaterally  in 2011.  She had bilateral lower extremity arterial duplex in our office today which are reviewed and interpreted. This showed a right superficial femoral artery occlusions with significant plaque in the left superficial femoral artery as well as the tibial arteries bilaterally   ASSESSMENT: Bilateral lower extremity peripheral arterial disease. However her symptoms did not really seem consistent with peripheral arterial disease. Her symptoms sound more neuropathic in nature. This is probably multifactorial. There is probably some combination of atherosclerotic occlusive disease as well as her diabetes and her previous orthopedic injuries. Although her right ABI number was slightly less than 2 years ago. Her left ABI had increased slightly. I believe these numbers probably are fairly consistent and really unchanged since 2011. She has multiple comorbidities. She currently does not have limb threatening ischemia. If she develops rest pain in her right foot for nonhealing wound we would consider an arteriogram. However in light of her renal dysfunction I do not believe the risk of contrast exposure outweighs the benefits at this point. She will followup with [place] in 6 months time for repeat ABIs. She will followup sooner if she develops any the above symptoms. She also stated she does not ever member having a carotid duplex performed. Since she does have bilateral carotid bruits we will schedule her for carotid duplex in 6 months which he returns as well.   PLAN:  See above  Fabienne Bruns, MD [facility] [place] Office: [phone] Pager:  [phone]

## 2011-12-22 NOTE — ED Provider Notes (Signed)
History  Scribed for Raeford Razor, MD, the patient was seen in room TR07C/TR07C. This chart was scribed by Candelaria Stagers. The patient's care started at 1:48 PM   CSN: [id number]  Arrival date & time 12/22/11  1235   First MD Initiated Contact with Patient 12/22/11 1336      Chief Complaint  Patient presents with  . Animal Bite     The history is provided by the patient. No language interpreter was used.   [name] is a 78 y.o. female who presents to the Emergency Department complaining of an animal bite to her left hand from a raccoon that happened yesterday.  Pt states that the raccoon bit her while she was trying to feed it.  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  Pt has washed the wound and applied antibiotic ointment.    Past Medical History  Diagnosis Date  . Coronary artery disease     Remote PCI. Had BMS to RCA and DES stent to Diagonal in Jan. 2012   . Diabetes mellitus     long standing  . Diastolic heart failure     EF 55 to 60% per cath in Jan 2012  . Hyperlipidemia   . Hypertension   . Carotid bruit   . Gout   . Obesity   . TIA (transient ischemic attack)   . Chronic kidney disease     Stage 3  . Ischemic cardiomyopathy     with prior EF of 35%  . NSTEMI (non-ST elevated myocardial infarction) Jan 2012    with PCI to RCA and DX per Dr. Excell Seltzer  . Anemia   . Proteinuria     Past Surgical History  Procedure Date  . Appendectomy   . Tonsillectomy   . Left ankle fusion   . Distal rca 04/05/2010    stent    No family history on file.  History  Substance Use Topics  . Smoking status: Former Smoker    Quit date: 03/21/1968  . Smokeless tobacco: Never Used  . Alcohol Use: No    OB History    Grav Para Term Preterm Abortions TAB SAB Ect Mult Living                  Review of Systems  Skin: Positive for wound (animal bite to the left hand).  All other systems reviewed and are negative.    Allergies    Penicillins and Sulfa antibiotics  Home Medications   Current Outpatient Rx  Name Route Sig Dispense Refill  . ASPIRIN 81 MG PO TABS Oral Take 81 mg by mouth daily.      Marland Kitchen BROMDAY 0.09 % OP SOLN Left Eye Place 1 drop into the left eye daily.    Marland Kitchen CALCIUM & MAGNESIUM CARBONATES PO Oral Take 2 capsules by mouth daily.    Marland Kitchen CARVEDILOL 6.25 MG PO TABS Oral Take 3.125 mg by mouth 2 (two) times daily with a meal.     . CLONIDINE HCL 0.1 MG PO TABS Oral Take 0.1 mg by mouth 2 (two) times daily.     . COLESEVELAM HCL 625 MG PO TABS Oral Take 1,875 mg by mouth 2 (two) times daily.     Marland Kitchen VASCULERA PO Oral Take by mouth daily.    . ERGOCALCIFEROL 50000 UNITS PO CAPS Oral Take 50,000 Units by mouth once a week. Takes on Monday    . EZETIMIBE-SIMVASTATIN 10-20 MG  PO TABS Oral Take 1 tablet by mouth at bedtime.      Marland Kitchen LIRAGLUTIDE 18 MG/3ML Peck SOLN Subcutaneous Inject 1.2 mg into the skin daily.      Marland Kitchen LOTEMAX 0.5 % OP SUSP Left Eye Place 1 drop into the left eye 3 (three) times daily.     . LUBIPROSTONE 8 MCG PO CAPS Oral Take 8 mcg by mouth 2 (two) times daily with a meal.    . OMEGA-3-ACID ETHYL ESTERS 1 G PO CAPS Oral Take 1 g by mouth 2 (two) times daily.     Marland Kitchen NITROGLYCERIN 0.4 MG SL SUBL Sublingual Place 1 tablet (0.4 mg total) under the tongue every 5 (five) minutes as needed. 25 tablet 11    BP 163/48  Pulse 65  Temp 98 F (36.7 C)  Resp 18  SpO2 96%  Physical Exam  Nursing note and vitals reviewed. Constitutional: She appears well-developed and well-nourished. No distress.  HENT:  Head: Normocephalic and atraumatic.  Right Ear: External ear normal.  Eyes: EOM are normal. Pupils are equal, round, and reactive to light.  Neck: Neck supple.  Cardiovascular: Normal rate and regular rhythm.   Pulmonary/Chest: Effort normal. No respiratory distress. She has no wheezes. She has no rales.  Musculoskeletal: She exhibits no edema and no tenderness.       Wound near the base of the first  metacarpal of the left hand c/o mechanism described.  No concerning drainage.  No cellulitis.  Neurovascularly intact.     Neurological: She is alert. Cranial nerve deficit:  no gross defecits noted.  Skin: Skin is warm and dry. No rash noted.  Psychiatric: She has a normal mood and affect. Her behavior is normal.    ED Course  Procedures   DIAGNOSTIC STUDIES: Oxygen Saturation is 96% on room air, normal by my interpretation.    COORDINATION OF CARE:  14:15 Ordered: Rabies, vaccine, PCEC (RABAVERT) injection 1 mL   Labs Reviewed - No data to display No results found.   1. Bitten by raccoon       MDM  84yF with racoon bite. Wound without signs of infection. Previously vaccinated. No indication for RIG. Pt will only need vaccine on day 0 and 3.   I personally preformed the services scribed in my presence. The recorded information has been reviewed and considered. Raeford Razor, MD.        Raeford Razor, MD 12/25/11 [phone]

## 2011-12-22 NOTE — ED Notes (Signed)
Pt was bitten by a raccoon she found by the side of the road.  Pt fed the raccoon and it bite her on the left hand.  Pt states she had rabies shot over 10 yrs ago. The bite is through the bottom of the thumb and palm.  Pt alert oriented X4.

## 2011-12-23 NOTE — Addendum Note (Signed)
Addended by: Sharee Pimple on: 12/23/2011 09:17 AM   Modules accepted: Orders

## 2011-12-25 ENCOUNTER — Encounter (HOSPITAL_COMMUNITY): Payer: Self-pay

## 2011-12-25 ENCOUNTER — Emergency Department (INDEPENDENT_AMBULATORY_CARE_PROVIDER_SITE_OTHER)
Admission: EM | Admit: 2011-12-25 | Discharge: 2011-12-25 | Disposition: A | Discharge status: Home / Self Care | Payer: PRIVATE HEALTH INSURANCE | Source: Home / Self Care

## 2011-12-25 DIAGNOSIS — Z23 Encounter for immunization: Secondary | ICD-10-CM

## 2011-12-25 MED ORDER — RABIES VACCINE, PCEC IM SUSR
INTRAMUSCULAR | Status: AC
  Filled 2011-12-25: qty 1

## 2011-12-25 MED ORDER — RABIES VACCINE, PCEC IM SUSR
1.0000 mL | Freq: Once | INTRAMUSCULAR | Status: AC
  Administered 2011-12-25: 1 mL via INTRAMUSCULAR

## 2011-12-25 NOTE — ED Notes (Signed)
Spoke with Dispensing optician, faxing over rabies paperwork

## 2011-12-25 NOTE — ED Notes (Signed)
Patient here today for day 3 rabies injection to left deltoid

## 2012-01-18 ENCOUNTER — Ambulatory Visit
Admission: RE | Admit: 2012-01-18 | Discharge: 2012-01-18 | Disposition: A | Discharge status: Home / Self Care | Payer: PRIVATE HEALTH INSURANCE | Source: Ambulatory Visit | Attending: Gastroenterology | Admitting: Gastroenterology

## 2012-01-18 ENCOUNTER — Other Ambulatory Visit: Payer: Self-pay | Admitting: Gastroenterology

## 2012-01-18 DIAGNOSIS — K5900 Constipation, unspecified: Secondary | ICD-10-CM

## 2012-01-20 ENCOUNTER — Ambulatory Visit
Admission: RE | Admit: 2012-01-20 | Discharge: 2012-01-20 | Disposition: A | Discharge status: Home / Self Care | Payer: PRIVATE HEALTH INSURANCE | Source: Ambulatory Visit | Attending: Gastroenterology | Admitting: Gastroenterology

## 2012-01-20 ENCOUNTER — Telehealth (HOSPITAL_COMMUNITY): Payer: Self-pay | Admitting: *Deleted

## 2012-01-20 ENCOUNTER — Other Ambulatory Visit: Payer: Self-pay | Admitting: Gastroenterology

## 2012-01-20 DIAGNOSIS — K5900 Constipation, unspecified: Secondary | ICD-10-CM

## 2012-01-20 NOTE — ED Notes (Addendum)
I called pt. and verified that she had a rabies series in the past ( 10 yrs ago). She said she had it because she works with [employer].  Pt. received a vaccine in the ED on 10/3 and here on 10/6. The rest of her schedule said N/A.  She said they did not do a titer on her in the ED. She said she would have her PCP check a titer on her the next time she gets checked. Vassie Moselle 01/20/2012

## 2012-04-17 ENCOUNTER — Ambulatory Visit (INDEPENDENT_AMBULATORY_CARE_PROVIDER_SITE_OTHER): Payer: PRIVATE HEALTH INSURANCE | Admitting: Nurse Practitioner

## 2012-04-17 ENCOUNTER — Encounter: Payer: Self-pay | Admitting: Nurse Practitioner

## 2012-04-17 VITALS — BP 160/78 | HR 72 | Ht 61.0 in | Wt 142.8 lb

## 2012-04-17 DIAGNOSIS — I259 Chronic ischemic heart disease, unspecified: Secondary | ICD-10-CM

## 2012-04-17 NOTE — Progress Notes (Signed)
[name] Date of Birth: [birth year] Medical Record #[id number]  History of Present Illness: [name] is seen back today for a 6 month check. She is seen back for Dr. Excell Seltzer. She has multiple issues which include CAD, remote PCI, BMS to the RCA and DES to the DX in January of 2012. She has longstanding diabetes, HTN, HLD, diastolic heart failure with EF 55 to 60% per cath back in 2012 but has been as low as 35%, PVD (seeing Dr. Darrick Penna), gout, CKD and chronic anemia.   I saw her back in August. She was felt to be doing well. Has seen Dr. Darrick Penna since she was last here. He is managing her PVD medically. She has not had limb threatening in regards to her PVD.   She comes in today. She is here alone. She is doing ok. 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 Did get bit by a raccoon and has been to the Urgent Care. She has had prior rabies shots. No chest pain. Not short of breath. BP at home is much better than it is here. She was a little confrontational with her daughter today and was upset. No problems with her medicines. Overall, she is doing well. She has regular labs with her PCP.  Current Outpatient Prescriptions on File Prior to Visit  Medication Sig Dispense Refill  . aspirin 81 MG tablet Take 81 mg by mouth daily.        Marland Kitchen BROMDAY 0.09 % SOLN Place 1 drop into the left eye daily.      . carvedilol (COREG) 6.25 MG tablet Take 3.125 mg by mouth 2 (two) times daily with a meal.       . cloNIDine (CATAPRES) 0.1 MG tablet Take 0.1 mg by mouth daily.       . colesevelam (WELCHOL) 625 MG tablet Take 1,875 mg by mouth 2 (two) times daily.       . Dietary Management Product (VASCULERA PO) Take by mouth daily.      . ergocalciferol (VITAMIN D2) 50000 UNITS capsule Take 50,000 Units by mouth once a week. Takes on Monday      . ezetimibe-simvastatin (VYTORIN) 10-20 MG per tablet Take 1 tablet by mouth at bedtime.        . Liraglutide 18 MG/3ML SOLN Inject 1.2 mg into the skin daily.        Marland Kitchen  LOTEMAX 0.5 % ophthalmic suspension Place 1 drop into the left eye 3 (three) times daily.       Marland Kitchen lubiprostone (AMITIZA) 8 MCG capsule Take 8 mcg by mouth 2 (two) times daily with a meal.      . nitroGLYCERIN (NITROSTAT) 0.4 MG SL tablet Place 1 tablet (0.4 mg total) under the tongue every 5 (five) minutes as needed.  25 tablet  11  . omega-3 acid ethyl esters (LOVAZA) 1 G capsule Take 4 g by mouth daily.         Allergies  Allergen Reactions  . Penicillins Hives  . Sulfa Antibiotics Other (See Comments)    Tongue swells    Past Medical History  Diagnosis Date  . Coronary artery disease     Remote PCI. Had BMS to RCA and DES stent to Diagonal in Jan. 2012   . Diabetes mellitus     long standing  . Diastolic heart failure     EF 55 to 60% per cath in Jan 2012  . Hyperlipidemia   . Hypertension   . Carotid bruit   .  Gout   . Obesity   . TIA (transient ischemic attack)   . Chronic kidney disease     Stage 3  . Ischemic cardiomyopathy     with prior EF of 35%  . NSTEMI (non-ST elevated myocardial infarction) Jan 2012    with PCI to RCA and DX per Dr. Excell Seltzer  . Anemia   . Proteinuria     Past Surgical History  Procedure Date  . Appendectomy   . Tonsillectomy   . Left ankle fusion   . Distal rca 04/05/2010    stent    History  Smoking status  . Former Smoker  . Quit date: 03/21/1968  Smokeless tobacco  . Never Used    History  Alcohol Use No    History reviewed. No pertinent family history.  Review of Systems: The review of systems is per the HPI.  All other systems were reviewed and are negative.  Physical Exam: BP 160/78  Pulse 72  Ht 5\' 1"  (1.549 m)  Wt 142 lb 12.8 oz (64.774 kg)  BMI 26.98 kg/m2 Patient is very pleasant and in no acute distress. Skin is warm and dry. Color is normal.  HEENT is unremarkable. Normocephalic/atraumatic. PERRL. Sclera are nonicteric. Neck is supple. No masses. No JVD. Lungs are clear. Cardiac exam shows a regular rate and  rhythm. Abdomen is soft. Extremities are without edema. Gait and ROM are intact. No gross neurologic deficits noted.  LABORATORY DATA:  Lab Results  Component Value Date   WBC 3.9* 06/18/2010   HGB 9.6* 06/18/2010   HCT 27.4* 06/18/2010   PLT 180.0 06/18/2010   GLUCOSE 132* 04/17/2010   CHOL  Value: 108        ATP III CLASSIFICATION:  <200     mg/dL   Desirable  784-696  mg/dL   Borderline High  >=295    mg/dL   High        2/84/1324   TRIG 100 04/04/2010   HDL 52 04/04/2010   LDLCALC  Value: 36        Total Cholesterol/HDL:CHD Risk Coronary Heart Disease Risk Table                     Men   Women  1/2 Average Risk   3.4   3.3  Average Risk       5.0   4.4  2 X Average Risk   9.6   7.1  3 X Average Risk  23.4   11.0        Use the calculated Patient Ratio above and the CHD Risk Table to determine the patient's CHD Risk.        ATP III CLASSIFICATION (LDL):  <100     mg/dL   Optimal  401-027  mg/dL   Near or Above                    Optimal  130-159  mg/dL   Borderline  253-664  mg/dL   High  >403     mg/dL   Very High 4/74/2595   ALT 14 04/15/2010   AST 16 04/15/2010   NA 143 04/17/2010   K 4.5 04/17/2010   CL 116* 04/17/2010   CREATININE 1.27* 04/17/2010   BUN 21 04/17/2010   CO2 19 04/17/2010   TSH 2.297 04/04/2010   INR 1.09 04/15/2010   HGBA1C  Value: 7.6 (NOTE)  According to the ADA Clinical Practice Recommendations for 2011, when HbA1c is used as a screening test:   >=6.5%   Diagnostic of Diabetes Mellitus           (if abnormal result  is confirmed)  5.7-6.4%   Increased risk of developing Diabetes Mellitus  References:Diagnosis and Classification of Diabetes Mellitus,Diabetes Care,2011,34(Suppl 1):S62-S69 and Standards of Medical Care in         Diabetes - 2011,Diabetes Care,2011,34  (Suppl 1):S11-S61.* 04/04/2010   Assessment / Plan: 1. CAD - with past PCI's - no chest pain reported. Doing well.   2. DM - followed by PCP  3.  HTN - better blood pressure control at home  4. Diastolic heart failure - looks compensated.   She seems to be holding her own. No change in her medicines today. I will see her back in 6 months.   Patient is agreeable to this plan and will call if any problems develop in the interim.

## 2012-04-17 NOTE — Patient Instructions (Signed)
I think you are doing well  Stay on your current medicines  I will see you in 6 months  Call the [facility] office at [phone] if you have any questions, problems or concerns.

## 2012-04-19 ENCOUNTER — Ambulatory Visit (INDEPENDENT_AMBULATORY_CARE_PROVIDER_SITE_OTHER): Payer: PRIVATE HEALTH INSURANCE | Admitting: Ophthalmology

## 2012-05-14 ENCOUNTER — Ambulatory Visit (INDEPENDENT_AMBULATORY_CARE_PROVIDER_SITE_OTHER): Payer: PRIVATE HEALTH INSURANCE | Admitting: Ophthalmology

## 2012-05-14 DIAGNOSIS — E1139 Type 2 diabetes mellitus with other diabetic ophthalmic complication: Secondary | ICD-10-CM

## 2012-05-14 DIAGNOSIS — H43819 Vitreous degeneration, unspecified eye: Secondary | ICD-10-CM

## 2012-05-14 DIAGNOSIS — E11311 Type 2 diabetes mellitus with unspecified diabetic retinopathy with macular edema: Secondary | ICD-10-CM

## 2012-05-14 DIAGNOSIS — E11319 Type 2 diabetes mellitus with unspecified diabetic retinopathy without macular edema: Secondary | ICD-10-CM

## 2012-06-20 ENCOUNTER — Encounter: Payer: Self-pay | Admitting: Vascular Surgery

## 2012-06-21 ENCOUNTER — Other Ambulatory Visit (INDEPENDENT_AMBULATORY_CARE_PROVIDER_SITE_OTHER): Payer: PRIVATE HEALTH INSURANCE | Admitting: *Deleted

## 2012-06-21 ENCOUNTER — Encounter (INDEPENDENT_AMBULATORY_CARE_PROVIDER_SITE_OTHER): Payer: PRIVATE HEALTH INSURANCE | Admitting: *Deleted

## 2012-06-21 ENCOUNTER — Encounter: Payer: Self-pay | Admitting: Vascular Surgery

## 2012-06-21 ENCOUNTER — Ambulatory Visit (INDEPENDENT_AMBULATORY_CARE_PROVIDER_SITE_OTHER): Payer: PRIVATE HEALTH INSURANCE | Admitting: Vascular Surgery

## 2012-06-21 VITALS — BP 142/54 | HR 58 | Resp 16 | Ht 60.0 in | Wt 139.0 lb

## 2012-06-21 DIAGNOSIS — I6529 Occlusion and stenosis of unspecified carotid artery: Secondary | ICD-10-CM

## 2012-06-21 DIAGNOSIS — M79609 Pain in unspecified limb: Secondary | ICD-10-CM

## 2012-06-21 DIAGNOSIS — I739 Peripheral vascular disease, unspecified: Secondary | ICD-10-CM

## 2012-06-21 DIAGNOSIS — I7090 Unspecified atherosclerosis: Secondary | ICD-10-CM

## 2012-06-21 NOTE — Progress Notes (Signed)
[facility] HISTORY AND PHYSICAL    History of Present Illness:  Patient is a 79 y.o. year old female who presents for evaluation of bilateral lower extremity pain. She was last seen in October of 2013. The patient states she has had several episodes over the last few months where pain starts in her feet and radiates all the way to her pelvis giving her a "fire-like sensation". This is not associated with ambulation. It has been completely random and has occurred in a standing position. It has occurred sometimes when walking. Her symptoms are very similar to October. It has also occurred while lying in bed in the evening. She denies any classic claudication symptoms. According to her she can walk all day long. She was previously seen by my partner Dr. Arbie Cookey in 2007 for a nonhealing ulcer on the right leg. This has now spontaneously healed. I reviewed her ABIs from 2011 which were 0.56 bilaterally. She had monophasic tibial waveforms at that time..  Other medical problems include  CKD 3, coronary artery disease status post multiple stents, diabetes, diastolic heart failure, hyperlipidemia, hypertension, carotid bruit, gout, remote TIA, prior myocardial infarction. These problems are all currently stable.  She did have what sounds like fairly severe bilateral active: Fractures several years ago. She states that the right leg has not really been perfect since then and frequently has restless symptoms.  She has been having increased leg swelling recently.    Past Medical History   Diagnosis  Date   .  Coronary artery disease         Remote PCI. Had BMS to RCA and DES stent to Diagonal in Jan. 2012    .  Diabetes mellitus         long standing   .  Diastolic heart failure         EF 55 to 60% per cath in Jan 2012   .  Hyperlipidemia     .  Hypertension     .  Carotid bruit     .  Gout     .  Obesity     .  TIA (transient ischemic attack)     .  Chronic kidney disease         Stage 3   .  Ischemic cardiomyopathy         with prior EF of 35%   .  NSTEMI (non-ST elevated myocardial infarction)  Jan 2012       with PCI to RCA and DX per Dr. Excell Seltzer   .  Anemia     .  Proteinuria         Past Surgical History   Procedure  Date   .  Appendectomy     .  Tonsillectomy     .  Left ankle fusion     .  Distal rca  04/05/2010       stent      Social History History   Substance Use Topics   .  Smoking status:  Former Smoker       Quit date:  03/21/1968   .  Smokeless tobacco:  Never Used   .  Alcohol Use:  No    Current Outpatient Prescriptions on File Prior to Visit  Medication Sig Dispense Refill  . aspirin 81 MG tablet Take 81 mg by mouth daily.        Marland Kitchen BROMDAY 0.09 % SOLN Place 1 drop into the  left eye daily.      . carvedilol (COREG) 6.25 MG tablet Take 3.125 mg by mouth 2 (two) times daily with a meal.       . cloNIDine (CATAPRES) 0.1 MG tablet Take 0.1 mg by mouth daily.       . colesevelam (WELCHOL) 625 MG tablet Take 1,875 mg by mouth 2 (two) times daily.       Marland Kitchen ezetimibe-simvastatin (VYTORIN) 10-20 MG per tablet Take 1 tablet by mouth at bedtime.        . Liraglutide 18 MG/3ML SOLN Inject 1.2 mg into the skin daily.        Marland Kitchen LOTEMAX 0.5 % ophthalmic suspension Place 1 drop into the left eye 3 (three) times daily.       Marland Kitchen lubiprostone (AMITIZA) 8 MCG capsule Take 8 mcg by mouth 2 (two) times daily with a meal.      . nitroGLYCERIN (NITROSTAT) 0.4 MG SL tablet Place 1 tablet (0.4 mg total) under the tongue every 5 (five) minutes as needed.  25 tablet  11  . omega-3 acid ethyl esters (LOVAZA) 1 G capsule Take 4 g by mouth daily.       . Dietary Management Product (VASCULERA PO) Take by mouth daily.      . ergocalciferol (VITAMIN D2) 50000 UNITS capsule Take 50,000 Units by mouth once a week. Takes on Monday       No current facility-administered medications on file prior to visit.    Allergies    Allergies   Allergen  Reactions   .   Penicillins     .  Sulfa Antibiotics      Review of systems: She denies chest pain. She denies shortness of breath.  Physical Examination  Filed Vitals:   06/21/12 1143 06/21/12 1147  BP: 141/57 142/54  Pulse: 59 58  Resp: 16   Height: 5' (1.524 m)   Weight: 139 lb (63.05 kg)   SpO2: 96%    General:  Alert and oriented, no acute distress HEENT: Normal Neck: No bruit appreciated today Pulmonary: Clear to auscultation bilaterally Cardiac: Regular Rate and Rhythm without murmur Skin: No rash, she has thickened skin in the calf region just above the medial malleolus bilaterally. This is tender to palpation. She has dry scaly skin on her feet bilaterally with some loss of hair, no ulcerations, trace pretibial edema and pedal edema bilaterally Extremity Pulses:  2+ radial, brachial, femoral, absent dorsalis pedis, posterior tibial pulses bilaterally Musculoskeletal: Some shortening of the right leg compared to the left from previous ankle fracture   Neurologic: Upper and lower extremity motor 5/5 and symmetric  DATA: I reviewed her ABIs from 2011 as well as her ABIs from September 23 of this year done a [facility] radiology. Her most recent ABI was 0.4 on the right 0.6 on the left this is compared to 0.56 bilaterally in 2011.  she also had a carotid duplex scan today which are reviewed and interpreted. This shows a less than 40% carotid stenosis bilaterally with calcified vessels. Her ABIs today were also reviewed and interpreted. ABI on the right 0.4 left 0.7  ASSESSMENT: Bilateral lower extremity peripheral arterial disease.  I believe these numbers probably are fairly consistent and really unchanged since 2011. She has multiple comorbidities. She currently does not have limb threatening ischemia. If she develops rest pain in her right foot for nonhealing wound we would consider an arteriogram. However in light of her renal dysfunction I do not believe  the risk of contrast exposure outweighs  the benefits at this point. She will followup with [place] in 6 months time for repeat ABIs. She will followup sooner if she develops any the above symptoms.   PLAN:  See above  Fabienne Bruns, MD [facility] Office: [phone] Pager: [phone]

## 2012-06-22 NOTE — Addendum Note (Signed)
Addended by: Adria Dill L on: 06/22/2012 10:10 AM   Modules accepted: Orders

## 2012-07-04 ENCOUNTER — Encounter: Payer: Self-pay | Admitting: Internal Medicine

## 2012-07-04 ENCOUNTER — Ambulatory Visit (INDEPENDENT_AMBULATORY_CARE_PROVIDER_SITE_OTHER): Payer: PRIVATE HEALTH INSURANCE | Admitting: Internal Medicine

## 2012-07-04 VITALS — BP 136/84 | HR 65 | Temp 97.9°F | Resp 15 | Ht 60.0 in | Wt 139.6 lb

## 2012-07-04 DIAGNOSIS — N058 Unspecified nephritic syndrome with other morphologic changes: Secondary | ICD-10-CM

## 2012-07-04 DIAGNOSIS — E1121 Type 2 diabetes mellitus with diabetic nephropathy: Secondary | ICD-10-CM

## 2012-07-04 DIAGNOSIS — E1129 Type 2 diabetes mellitus with other diabetic kidney complication: Secondary | ICD-10-CM

## 2012-07-04 DIAGNOSIS — I25810 Atherosclerosis of coronary artery bypass graft(s) without angina pectoris: Secondary | ICD-10-CM

## 2012-07-04 DIAGNOSIS — I10 Essential (primary) hypertension: Secondary | ICD-10-CM

## 2012-07-04 MED ORDER — LIRAGLUTIDE 18 MG/3ML ~~LOC~~ SOLN: 1.2000 mg | Freq: Every day | SUBCUTANEOUS | Status: DC

## 2012-07-04 NOTE — Progress Notes (Signed)
  Subjective:    Patient ID: [name], female    DOB: 1927/07/14, 79 y.o.   MRN: [id number]  HPI  Patient here with her daughter. She has no complaints this visit. Reviewed home cbg- 90 - 150 with one in 158. a1c jan 6.6 has regular bowel movement now. Follows with dr buccini from [facility]. See ros. She needs handicap sticker signed for her vehicle.  Review of Systems  Constitutional: Negative for fever, chills, activity change and appetite change.  HENT: Negative for congestion.   Eyes: Negative for discharge and visual disturbance.  Respiratory: Negative for cough and shortness of breath.   Cardiovascular: Positive for leg swelling. Negative for chest pain and palpitations.  Gastrointestinal: Negative for abdominal pain, diarrhea, blood in stool and abdominal distention.  Endocrine: Negative for polydipsia, polyphagia and polyuria.  Genitourinary: Negative for dysuria and vaginal discharge.  Musculoskeletal: Positive for arthralgias. Negative for back pain and gait problem.  Neurological: Negative for dizziness, syncope and headaches.  Hematological: Negative for adenopathy.  Psychiatric/Behavioral: Negative for behavioral problems and agitation.       Objective:   Physical Exam  Constitutional: She is oriented to person, place, and time. She appears well-developed and well-nourished. No distress.  obese  HENT:  Head: Normocephalic and atraumatic.  Mouth/Throat: Oropharynx is clear and moist.  Eyes: Conjunctivae are normal. Pupils are equal, round, and reactive to light.  Neck: Normal range of motion. Neck supple.  Cardiovascular: Normal rate, regular rhythm and normal heart sounds.   No murmur heard. Pulmonary/Chest: Effort normal and breath sounds normal. No respiratory distress. She has no wheezes.  Abdominal: Soft. Bowel sounds are normal. She exhibits no distension.  Musculoskeletal: Normal range of motion. She exhibits edema. She exhibits no tenderness.  Neurological: She  is alert and oriented to person, place, and time.  Skin: Skin is warm and dry. She is not diaphoretic.  Psychiatric: She has a normal mood and affect.   BP 136/84  Pulse 65  Temp(Src) 97.9 F (36.6 C) (Oral)  Resp 15  Ht 5' (1.524 m)  Wt 139 lb 9.6 oz (63.322 kg)  BMI 27.26 kg/m2    Assessment & Plan:   htn- bp well controlled with current regimen. Check bmp today. No med changes made  Constipation- continue miralax for now and monitor  Dm- has well controlled cbg and a1c, will decrease victoza to 1.2 mg daily and recheck a1c today and if her a1c remains well controlled, will decrease victoza to 0.6 mg daily. Continue cbg monitor at home. Continue asa  CAD- remains chest pain free. Continue carvedilol, simvastatin, asa

## 2012-07-05 ENCOUNTER — Other Ambulatory Visit: Payer: Self-pay | Admitting: Geriatric Medicine

## 2012-07-05 DIAGNOSIS — E1121 Type 2 diabetes mellitus with diabetic nephropathy: Secondary | ICD-10-CM | POA: Insufficient documentation

## 2012-07-05 DIAGNOSIS — I25810 Atherosclerosis of coronary artery bypass graft(s) without angina pectoris: Secondary | ICD-10-CM | POA: Insufficient documentation

## 2012-07-05 DIAGNOSIS — E119 Type 2 diabetes mellitus without complications: Secondary | ICD-10-CM

## 2012-07-05 LAB — BASIC METABOLIC PANEL
BUN/Creatinine Ratio: 28 — ABNORMAL HIGH (ref 11–26)
CO2: 25 mmol/L (ref 19–28)
Creatinine, Ser: 1.09 mg/dL — ABNORMAL HIGH (ref 0.57–1.00)
GFR calc Af Amer: 54 mL/min/{1.73_m2} — ABNORMAL LOW (ref >59)
Sodium: 145 mmol/L — ABNORMAL HIGH (ref 134–144)

## 2012-07-05 LAB — HEMOGLOBIN A1C: Hgb A1c MFr Bld: 6.3 % — ABNORMAL HIGH (ref 4.8–5.6)

## 2012-08-08 ENCOUNTER — Encounter: Payer: Self-pay | Admitting: Cardiology

## 2012-08-09 ENCOUNTER — Other Ambulatory Visit: Payer: Self-pay | Admitting: *Deleted

## 2012-09-12 ENCOUNTER — Ambulatory Visit (INDEPENDENT_AMBULATORY_CARE_PROVIDER_SITE_OTHER): Payer: PRIVATE HEALTH INSURANCE | Admitting: Ophthalmology

## 2012-09-12 DIAGNOSIS — E11319 Type 2 diabetes mellitus with unspecified diabetic retinopathy without macular edema: Secondary | ICD-10-CM

## 2012-09-12 DIAGNOSIS — H35039 Hypertensive retinopathy, unspecified eye: Secondary | ICD-10-CM

## 2012-09-12 DIAGNOSIS — E1139 Type 2 diabetes mellitus with other diabetic ophthalmic complication: Secondary | ICD-10-CM

## 2012-09-12 DIAGNOSIS — I10 Essential (primary) hypertension: Secondary | ICD-10-CM

## 2012-09-12 DIAGNOSIS — H43819 Vitreous degeneration, unspecified eye: Secondary | ICD-10-CM

## 2012-09-12 DIAGNOSIS — E11311 Type 2 diabetes mellitus with unspecified diabetic retinopathy with macular edema: Secondary | ICD-10-CM

## 2012-09-19 ENCOUNTER — Ambulatory Visit (INDEPENDENT_AMBULATORY_CARE_PROVIDER_SITE_OTHER): Payer: Self-pay | Admitting: Ophthalmology

## 2012-10-01 ENCOUNTER — Ambulatory Visit (INDEPENDENT_AMBULATORY_CARE_PROVIDER_SITE_OTHER): Payer: PRIVATE HEALTH INSURANCE | Admitting: Nurse Practitioner

## 2012-10-01 ENCOUNTER — Encounter: Payer: Self-pay | Admitting: Nurse Practitioner

## 2012-10-01 VITALS — BP 156/62 | HR 56 | Ht 61.0 in | Wt 137.4 lb

## 2012-10-01 DIAGNOSIS — I259 Chronic ischemic heart disease, unspecified: Secondary | ICD-10-CM

## 2012-10-01 MED ORDER — DOXYCYCLINE HYCLATE 50 MG PO CAPS: 50.0000 mg | ORAL_CAPSULE | Freq: Two times a day (BID) | ORAL | Status: DC

## 2012-10-01 NOTE — Progress Notes (Signed)
[name] Date of Birth: [birth year] Medical Record #[id number]  History of Present Illness: [name] is seen back today for a 6 month check. Seen for Dr. Excell Seltzer. She has  multiple issues which include CAD, remote PCI, BMS to the RCA and DES to the DX in January of 2012. She has longstanding diabetes, HTN, HLD, diastolic heart failure with EF 55 to 60% per cath back in 2012 but has been as low as 35%, PVD (seeing Dr. Darrick Penna), gout, CKD and chronic anemia.   Seen back in January and seemed to be doing ok.  Comes in today. Here with her daughter. Still taking care of her "critters". No chest pain. Not short of breath. Did fall a week ago due to slipping on some water. Has a black eye on the left. More worried about some "bite" on her belly from a couple of weeks ago. She has been picking at it. She does not know what bit her - she has gotten "black" stuff expressed out of it. Still with a small lump but slow to improve. Claudication symptoms have improved for some unknown reason.   Current Outpatient Prescriptions  Medication Sig Dispense Refill  . aspirin 81 MG tablet Take 81 mg by mouth daily.        Marland Kitchen BROMDAY 0.09 % SOLN Place 1 drop into the left eye daily.      . carvedilol (COREG) 6.25 MG tablet Take 3.125 mg by mouth 2 (two) times daily with a meal.       . cloNIDine (CATAPRES) 0.1 MG tablet Take 0.1 mg by mouth daily.       . colesevelam (WELCHOL) 625 MG tablet Take 1,875 mg by mouth 2 (two) times daily.       Marland Kitchen ezetimibe-simvastatin (VYTORIN) 10-20 MG per tablet Take 1 tablet by mouth at bedtime.        . Liraglutide (VICTOZA) 18 MG/3ML SOLN injection Inject 0.2 mLs (1.2 mg total) into the skin daily.  6 mg  3  . LOTEMAX 0.5 % ophthalmic suspension Place 1 drop into the left eye 3 (three) times daily.       . nitroGLYCERIN (NITROSTAT) 0.4 MG SL tablet Place 1 tablet (0.4 mg total) under the tongue every 5 (five) minutes as needed.  25 tablet  11  . NOVOFINE 32G X 6 MM MISC       .  omega-3 acid ethyl esters (LOVAZA) 1 G capsule Take 4 g by mouth daily.       . polyethylene glycol (MIRALAX / GLYCOLAX) packet Take 17 g by mouth daily.      Marland Kitchen doxycycline (VIBRAMYCIN) 50 MG capsule Take 1 capsule (50 mg total) by mouth 2 (two) times daily.  20 capsule  0   No current facility-administered medications for this visit.    Allergies  Allergen Reactions  . Penicillins Hives  . Sulfa Antibiotics Other (See Comments)    Tongue swells    Past Medical History  Diagnosis Date  . Coronary artery disease     Remote PCI. Had BMS to RCA and DES stent to Diagonal in Jan. 2012   . Diabetes mellitus     long standing  . Diastolic heart failure     EF 55 to 60% per cath in Jan 2012  . Hyperlipidemia   . Hypertension   . Carotid bruit   . Gout   . Obesity   . TIA (transient ischemic attack)   . Chronic kidney disease  Stage 3  . Ischemic cardiomyopathy     with prior EF of 35%  . Anemia   . Proteinuria   . NSTEMI (non-ST elevated myocardial infarction) Jan 2012    with PCI to RCA and DX per Dr. Excell Seltzer    Past Surgical History  Procedure Laterality Date  . Appendectomy    . Tonsillectomy    . Left ankle fusion    . Distal rca  04/05/2010    stent  . Fracture surgery Right 2005    bilateral ankles     History  Smoking status  . Former Smoker  . Quit date: 03/21/1968  Smokeless tobacco  . Never Used    History  Alcohol Use No    History reviewed. No pertinent family history.  Review of Systems: The review of systems is per the HPI.  All other systems were reviewed and are negative.  Physical Exam: BP 156/62  Pulse 56  Ht 5\' 1"  (1.549 m)  Wt 137 lb 6.4 oz (62.324 kg)  BMI 25.97 kg/m2 Patient is very pleasant and in no acute distress. Skin is warm and dry. Color is normal.  HEENT is unremarkable. Normocephalic/atraumatic. PERRL. Sclera are nonicteric. Neck is supple. No masses. No JVD. Lungs are clear. Cardiac exam shows a regular rate and rhythm.  Abdomen is soft. She has a dime size lump in the left upper abdomen. Looks a little red. Looks like 2 tiny puncture sites. Extremities are without edema. Gait and ROM are intact. No gross neurologic deficits noted.  LABORATORY DATA: Lab Results  Component Value Date   WBC 3.9* 06/18/2010   HGB 9.6* 06/18/2010   HCT 27.4* 06/18/2010   PLT 180.0 06/18/2010   GLUCOSE 87 07/04/2012   CHOL  Value: 108        ATP III CLASSIFICATION:  <200     mg/dL   Desirable  161-096  mg/dL   Borderline High  >=045    mg/dL   High        06/27/8117   TRIG 100 04/04/2010   HDL 52 04/04/2010   LDLCALC  Value: 36        Total Cholesterol/HDL:CHD Risk Coronary Heart Disease Risk Table                     Men   Women  1/2 Average Risk   3.4   3.3  Average Risk       5.0   4.4  2 X Average Risk   9.6   7.1  3 X Average Risk  23.4   11.0        Use the calculated Patient Ratio above and the CHD Risk Table to determine the patient's CHD Risk.        ATP III CLASSIFICATION (LDL):  <100     mg/dL   Optimal  147-829  mg/dL   Near or Above                    Optimal  130-159  mg/dL   Borderline  562-130  mg/dL   High  >865     mg/dL   Very High 7/84/6962   ALT 14 04/15/2010   AST 16 04/15/2010   NA 145* 07/04/2012   K 5.3* 07/04/2012   CL 109* 07/04/2012   CREATININE 1.09* 07/04/2012   BUN 31* 07/04/2012   CO2 25 07/04/2012   TSH 2.297 04/04/2010   INR 1.09 04/15/2010   HGBA1C  6.3* 07/04/2012     Assessment / Plan: 1. CAD - with past PCIs - managed medically - no symptoms reported.   2. HTN - repeat BP by me is improved at 140/60. No change in her current therapy.  3. Diastolic HF - looks compensated. No symptoms  4. Bite - unknown etiology - will give her a course of Doxycycline for 10 days. She will need to see her PCP if it fails to improve.  I will see her back in 6 months.   Patient is agreeable to this plan and will call if any problems develop in the interim.   Rosalio Macadamia, RN, ANP-C [facility] [address] [place], [place]  [address]

## 2012-10-01 NOTE — Patient Instructions (Addendum)
I think you are doing ok  Stay on your current medicines  I will give you a course of Doxycycline for the bite on your stomach. Take this for 10 days. If it does not improve, let your primary doctor know  I will see you in 6 months  Call the [facility] office at [phone] if you have any questions, problems or concerns.

## 2012-10-02 ENCOUNTER — Other Ambulatory Visit: Payer: Self-pay | Admitting: Geriatric Medicine

## 2012-10-03 ENCOUNTER — Ambulatory Visit (INDEPENDENT_AMBULATORY_CARE_PROVIDER_SITE_OTHER): Payer: PRIVATE HEALTH INSURANCE | Admitting: Internal Medicine

## 2012-10-03 ENCOUNTER — Encounter: Payer: Self-pay | Admitting: Internal Medicine

## 2012-10-03 ENCOUNTER — Other Ambulatory Visit: Payer: Self-pay | Admitting: Internal Medicine

## 2012-10-03 VITALS — BP 110/62 | HR 68 | Temp 98.0°F | Resp 14 | Ht 61.0 in | Wt 137.8 lb

## 2012-10-03 DIAGNOSIS — E1129 Type 2 diabetes mellitus with other diabetic kidney complication: Secondary | ICD-10-CM

## 2012-10-03 DIAGNOSIS — N189 Chronic kidney disease, unspecified: Secondary | ICD-10-CM

## 2012-10-03 DIAGNOSIS — I10 Essential (primary) hypertension: Secondary | ICD-10-CM

## 2012-10-03 DIAGNOSIS — E785 Hyperlipidemia, unspecified: Secondary | ICD-10-CM

## 2012-10-03 DIAGNOSIS — N058 Unspecified nephritic syndrome with other morphologic changes: Secondary | ICD-10-CM

## 2012-10-03 DIAGNOSIS — E1121 Type 2 diabetes mellitus with diabetic nephropathy: Secondary | ICD-10-CM

## 2012-10-03 DIAGNOSIS — E119 Type 2 diabetes mellitus without complications: Secondary | ICD-10-CM

## 2012-10-03 NOTE — Progress Notes (Signed)
Patient ID: [name], female   DOB: 27-Oct-1927, 79 y.o.   MRN: [id number]  Chief Complaint  Patient presents with  . Medical Managment of Chronic Issues   Allergies  Allergen Reactions  . Penicillins Hives  . Sulfa Antibiotics Other (See Comments)    Tongue swells    Patient here with her daughter. She has no complaints this visit. Reviewed home cbg- 90 - 120. Had a recent mechanical fall and had a black eye and bruises. Also recently seen by cardiology. She is on doxycycline for ? Insect bite in her abdominal area  Review of Systems  Constitutional: Negative for fever, chills, activity change and appetite change.  HENT: Negative for congestion.   Eyes: Negative for discharge and visual disturbance.  Respiratory: Negative for cough and shortness of breath.   Cardiovascular: Negative for chest pain and palpitations.  Gastrointestinal: Negative for abdominal pain, diarrhea, blood in stool and abdominal distention.  Endocrine: Negative for polydipsia, polyphagia and polyuria.  Genitourinary: Negative for dysuria and vaginal discharge.  Musculoskeletal: Positive for arthralgias. Negative for back pain and gait problem.  Neurological: Negative for dizziness, syncope and headaches.  Hematological: Negative for adenopathy.  Psychiatric/Behavioral: Negative for behavioral problems and agitation.   BP 110/62  Pulse 68  Temp(Src) 98 F (36.7 C) (Oral)  Resp 14  Ht 5\' 1"  (1.549 m)  Wt 137 lb 12.8 oz (62.506 kg)  BMI 26.05 kg/m2  Constitutional: She is oriented to person, place, and time. She appears well-developed and well-nourished. No distress.  obese  HENT:   Head: Normocephalic and atraumatic.   Mouth/Throat: Oropharynx is clear and moist.  Eyes: Conjunctivae are normal. Pupils are equal, round, and reactive to light.  Neck: Normal range of motion. Neck supple.  Cardiovascular: Normal rate, regular rhythm and normal heart sounds.    No murmur heard. Pulmonary/Chest: Effort  normal and breath sounds normal. No respiratory distress. She has no wheezes.  Abdominal: Soft. Bowel sounds are normal. She exhibits no distension.  Musculoskeletal: Normal range of motion. She exhibits edema. She exhibits no tenderness.  Neurological: She is alert and oriented to person, place, and time.  Skin: Skin is warm and dry. She is not diaphoretic. Has bruise on her left eye area, skin tear in left arm and a 1 cm macular rash on her abdomen Psychiatric: She has a normal mood and affect.   CBC    Component Value Date/Time   WBC 3.9* 06/18/2010 1314   RBC 3.01* 06/18/2010 1314   HGB 9.6* 06/18/2010 1314   HCT 27.4* 06/18/2010 1314   PLT 180.0 06/18/2010 1314   MCV 90.9 06/18/2010 1314   MCH 30.2 04/15/2010 1412   MCHC 35.1 06/18/2010 1314   RDW 12.9 06/18/2010 1314   LYMPHSABS 1.2 06/18/2010 1314   MONOABS 0.4 06/18/2010 1314   EOSABS 0.1 06/18/2010 1314   BASOSABS 0.0 06/18/2010 1314    CMP     Component Value Date/Time   NA 145* 07/04/2012 1756   NA 143 04/17/2010 0502   K 5.3* 07/04/2012 1756   CL 109* 07/04/2012 1756   CO2 25 07/04/2012 1756   GLUCOSE 87 07/04/2012 1756   GLUCOSE 132* 04/17/2010 0502   BUN 31* 07/04/2012 1756   BUN 21 04/17/2010 0502   CREATININE 1.09* 07/04/2012 1756   CALCIUM 9.2 07/04/2012 1756   PROT 5.8* 04/15/2010 1412   ALBUMIN 3.1* 04/15/2010 1412   AST 16 04/15/2010 1412   ALT 14 04/15/2010 1412   ALKPHOS 44  04/15/2010 1412   BILITOT 0.5 04/15/2010 1412   GFRNONAA 47* 07/04/2012 1756   GFRAA 54* 07/04/2012 1756   ASSESSMENT/PLAN-  htn- bp well controlled with current regimen. Check bmp today. No med changes made  Constipation- continue miralax for now and monitor  Dm- has well controlled cbg and a1c, continue victoza 1.2 mg daily . Check a1c today. continue cbg monitor at home. Continue asa  CAD- remains chest pain free. Continue carvedilol, simvastatin, asa

## 2012-10-04 LAB — BASIC METABOLIC PANEL
BUN/Creatinine Ratio: 22 (ref 11–26)
CO2: 24 mmol/L (ref 18–29)
Creatinine, Ser: 1.3 mg/dL — ABNORMAL HIGH (ref 0.57–1.00)
GFR calc Af Amer: 44 mL/min/{1.73_m2} — ABNORMAL LOW (ref >59)
Sodium: 142 mmol/L (ref 134–144)

## 2012-11-28 ENCOUNTER — Other Ambulatory Visit: Payer: Self-pay | Admitting: Internal Medicine

## 2012-12-26 ENCOUNTER — Encounter: Payer: Self-pay | Admitting: Family

## 2012-12-27 ENCOUNTER — Encounter: Payer: Self-pay | Admitting: Family

## 2012-12-27 ENCOUNTER — Ambulatory Visit: Payer: PRIVATE HEALTH INSURANCE | Admitting: Vascular Surgery

## 2012-12-27 ENCOUNTER — Ambulatory Visit (HOSPITAL_COMMUNITY)
Admission: RE | Admit: 2012-12-27 | Discharge: 2012-12-27 | Disposition: A | Discharge status: Home / Self Care | Payer: PRIVATE HEALTH INSURANCE | Source: Ambulatory Visit | Attending: Family | Admitting: Family

## 2012-12-27 ENCOUNTER — Ambulatory Visit: Payer: PRIVATE HEALTH INSURANCE | Admitting: Family

## 2012-12-27 ENCOUNTER — Other Ambulatory Visit: Payer: PRIVATE HEALTH INSURANCE

## 2012-12-27 ENCOUNTER — Ambulatory Visit (INDEPENDENT_AMBULATORY_CARE_PROVIDER_SITE_OTHER): Payer: PRIVATE HEALTH INSURANCE | Admitting: Family

## 2012-12-27 DIAGNOSIS — I6529 Occlusion and stenosis of unspecified carotid artery: Secondary | ICD-10-CM | POA: Insufficient documentation

## 2012-12-27 DIAGNOSIS — M79609 Pain in unspecified limb: Secondary | ICD-10-CM

## 2012-12-27 DIAGNOSIS — I739 Peripheral vascular disease, unspecified: Secondary | ICD-10-CM | POA: Insufficient documentation

## 2012-12-27 NOTE — Progress Notes (Signed)
[facility] [place] HISTORY AND PHYSICAL -PAD  Previous Bypass Surgery/ Stent Placement: No  History of Present Illness [name] is a 79 y.o. female patient that Dr. Darrick Penna has been following for bilateral lower extremity peripheral arterial disease. When Dr. Darrick Penna saw her 6 months ago her ABI's were fairly consistent and essentially unchanged since 2011. She has multiple comorbidities. When seen 6 months ago she did not have limb threatening ischemia. If she develops rest pain in her right foot for nonhealing wound an arteriogram would be considered. However in light of her renal dysfunction Dr. Darrick Penna did not believe the risk of contrast exposure outweighs the benefits at this point. She was to followup in 6 months time for repeat ABIs. She was advised to followup sooner if she develops any the above symptoms. In the last 3 months the pain in her feet and legs has subsided and she attributes this to use of ice applicaton. She is on her feet all day and pain is not aggravated by walking; the pain now occurs randomly and periodically. She denies any non-healing wounds; was seen at the wound center for a non-healing wound which finally healed, last treated there about 2 years ago. She thinks that she may have had a slight stroke at one time as she has to take a while to search for what she wants to say. Has issues of falling forward, does not feel like her legs are weak, does not feel light headed. She does not know why she is not steady on her feet.  Pt has not had previous intervention.  Patient denies New Medical or Surgical History.  Pt Diabetic: Yes, A1C two months ago was 6.9, good glycemic control. Pt smoker: former smoker, quit 1970  Pt meds include: Statin :Yes Betablocker: Yes ASA: Yes Other anticoagulants/antiplatelets: no  Past Medical History  Diagnosis Date  . Coronary artery disease     Remote PCI. Had BMS to RCA and DES stent to Diagonal in Jan.  2012   . Diabetes mellitus     long standing  . Diastolic heart failure     EF 55 to 60% per cath in Jan 2012  . Hyperlipidemia   . Hypertension   . Carotid bruit   . Gout   . Obesity   . TIA (transient ischemic attack)   . Chronic kidney disease     Stage 3  . Ischemic cardiomyopathy     with prior EF of 35%  . Anemia   . Proteinuria   . NSTEMI (non-ST elevated myocardial infarction) Jan 2012    with PCI to RCA and DX per Dr. Excell Seltzer    Social History History  Substance Use Topics  . Smoking status: Former Smoker    Quit date: 03/21/1968  . Smokeless tobacco: Never Used  . Alcohol Use: No    Family History Family History  Problem Relation Age of Onset  . Family history unknown: Yes     Past Surgical History  Procedure Laterality Date  . Appendectomy    . Tonsillectomy    . Left ankle fusion    . Distal rca  04/05/2010    stent  . Fracture surgery Right 2005    bilateral ankles     Allergies  Allergen Reactions  . Penicillins Hives  . Sulfa Antibiotics Other (See Comments)    Tongue swells    Current Outpatient Prescriptions  Medication Sig Dispense Refill  . aspirin 81 MG tablet Take  81 mg by mouth daily.        Marland Kitchen BROMDAY 0.09 % SOLN Place 1 drop into the left eye daily.      . carvedilol (COREG) 3.125 MG tablet TAKE 1 TABLET BY MOUTH TWICE DAILY FOR BLOOD PRESSURE  60 tablet  3  . carvedilol (COREG) 6.25 MG tablet Take 3.125 mg by mouth 2 (two) times daily with a meal.       . cloNIDine (CATAPRES) 0.1 MG tablet Take 0.1 mg by mouth daily.       . colesevelam (WELCHOL) 625 MG tablet Take 1,875 mg by mouth 2 (two) times daily.       Marland Kitchen doxycycline (VIBRAMYCIN) 50 MG capsule Take 1 capsule (50 mg total) by mouth 2 (two) times daily.  20 capsule  0  . ezetimibe-simvastatin (VYTORIN) 10-20 MG per tablet Take 1 tablet by mouth at bedtime.        . LOTEMAX 0.5 % ophthalmic suspension Place 1 drop into the left eye 3 (three) times daily.       . nitroGLYCERIN  (NITROSTAT) 0.4 MG SL tablet Place 1 tablet (0.4 mg total) under the tongue every 5 (five) minutes as needed.  25 tablet  11  . NOVOFINE 32G X 6 MM MISC       . omega-3 acid ethyl esters (LOVAZA) 1 G capsule Take 4 g by mouth daily.       . polyethylene glycol (MIRALAX / GLYCOLAX) packet Take 17 g by mouth daily.      Marland Kitchen VICTOZA 18 MG/3ML SOPN INJECT 1.2MG  SUBCUTANEOUSLY EVERY DAY  3 mL  6   No current facility-administered medications for this visit.   Physical Examination  There were no vitals filed for this visit.  Filed Vitals:   12/27/12 1444  BP: 159/66  Pulse: 64  Resp:    Filed Weights   12/27/12 1439  Weight: 138 lb (62.596 kg)   Body mass index is 26.09 kg/(m^2).  General: A&O x 3, WDWN,  Gait: slow, deliberate Eyes: PERRLA, Pulmonary: CTAB, without wheezes , rales or rhonchi Cardiac: regular Rythm , with mild murmur          Carotid Bruits Left Right   Positive Positive  Aorta: not palpable Radial pulses: 2+ and equal                           VASCULAR EXAM: Extremities without ischemic changes  without Gangrene; without open wounds.                                                                                                          LE Pulses LEFT RIGHT       FEMORAL  not palpable   palpable        POPLITEAL  not palpable   not palpable       POSTERIOR TIBIAL  not palpable   not palpable        DORSALIS PEDIS      ANTERIOR TIBIAL  palpable  palpable    Abdomen: soft, NT, no masses. Skin: no rashes, no ulcers noted. Musculoskeletal: no muscle wasting or atrophy.  Neurologic: A&O X 3; Appropriate Affect ; SENSATION: normal; MOTOR FUNCTION:  moving all extremities equally, motor strength 5/5 in upper extremities, 3/5 in lower extremities. Speech is fluent/normal. CN 2-12 intact except for hard of hearing.    Non-Invasive Vascular Imaging: DATE: 12/27/2012 ABI: RIGHT 0.34, Waveforms: monopashic;  LEFT 0.64, Waveforms: monophasic Previous ABI's  (06/21/12): Right: 0.39, Left: 0.69  Carotid Duplex 12/27/12: <40% stenosis in both ICA's, unchanged from 06/21/12  ASSESSMENT: [name] is a 79 y.o. female who presents for scheduled 6 months follow up for LE PAD surveillance with ABI's and carotid Duplex since by her history she may have had a stroke or TIA. MR of the brain on 09/05/09 radiologist impression:  1. No evidence for acute or subacute infarction.  2. Moderate white matter disease as described. This likely  reflects sequelae of chronic microvascular ischemia.  Carotid Duplex today shows minimal plaque in both internal carotid arteries. Brachial pressures are equivalent.  Though hr ABI's  Indicate severe arterial occlusive disease on the right LE and moderate disease on the left, and this has not changed significantly from 6 months ago when she was having severe chronic pain in both feet and legs, this pain has mostly resolved in the last 3 months and she has not had non-healing wounds in about 2 years. She stays active and walks a great deal and denies claudication symptoms other than occasionally. With her history of non-healing wounds, she has no wounds for the last 2 years, her ischemic pain has almost resolved, and her co morbidities, any intervention to attempt revascularization may cause more harm than good unless she develops non-healing wounds or has chronic ischemic pain.  PLAN:  I discussed in depth with the patient the nature of atherosclerosis, and emphasized the importance of maximal medical management including strict control of blood pressure, blood glucose, and lipid levels, obtaining regular exercise, and continued cessation of smoking.  The patient is aware that without maximal medical management the underlying atherosclerotic disease process will progress, limiting the benefit of any interventions. Based on the patient's vascular studies and examination, pt will return to clinic in 6  months for bilateral LE  Duplex and ABI's. She knows to return sooner if needed.  The patient was given information about PAD including signs, symptoms, treatment, what symptoms should prompt the patient to seek immediate medical care, and risk reduction measures to take.  Charisse March, RN, MSN, FNP-C [facility] Phone: [phone]  Clinic MD: Darrick Penna  12/27/2012 1:46 PM

## 2012-12-27 NOTE — Patient Instructions (Signed)
Peripheral Vascular Disease Peripheral Vascular Disease (PVD), also called Peripheral Arterial Disease (PAD), is a circulation problem caused by cholesterol (atherosclerotic plaque) deposits in the arteries. PVD commonly occurs in the lower extremities (legs) but it can occur in other areas of the body, such as your arms. The cholesterol buildup in the arteries reduces blood flow which can cause pain and other serious problems. The presence of PVD can place a person at risk for Coronary Artery Disease (CAD).  CAUSES  Causes of PVD can be many. It is usually associated with more than one risk factor such as:   High Cholesterol.  Smoking.  Diabetes.  Lack of exercise or inactivity.  High blood pressure (hypertension).  Obesity.  Family history. SYMPTOMS   When the lower extremities are affected, patients with PVD may experience:  Leg pain with exertion or physical activity. This is called INTERMITTENT CLAUDICATION. This may present as cramping or numbness with physical activity. The location of the pain is associated with the level of blockage. For example, blockage at the abdominal level (distal abdominal aorta) may result in buttock or hip pain. Lower leg arterial blockage may result in calf pain.  As PVD becomes more severe, pain can develop with less physical activity.  In people with severe PVD, leg pain may occur at rest.  Other PVD signs and symptoms:  Leg numbness or weakness.  Coldness in the affected leg or foot, especially when compared to the other leg.  A change in leg color.  Patients with significant PVD are more prone to ulcers or sores on toes, feet or legs. These may take longer to heal or may reoccur. The ulcers or sores can become infected.  If signs and symptoms of PVD are ignored, gangrene may occur. This can result in the loss of toes or loss of an entire limb.  Not all leg pain is related to PVD. Other medical conditions can cause leg pain such  as:  Blood clots (embolism) or Deep Vein Thrombosis.  Inflammation of the blood vessels (vasculitis).  Spinal stenosis. DIAGNOSIS  Diagnosis of PVD can involve several different types of tests. These can include:  Pulse Volume Recording Method (PVR). This test is simple, painless and does not involve the use of X-rays. PVR involves measuring and comparing the blood pressure in the arms and legs. An ABI (Ankle-Brachial Index) is calculated. The normal ratio of blood pressures is 1. As this number becomes smaller, it indicates more severe disease.  < 0.95  indicates significant narrowing in one or more leg vessels.  <0.8 there will usually be pain in the foot, leg or buttock with exercise.  <0.4 will usually have pain in the legs at rest.  <0.25  usually indicates limb threatening PVD.  Doppler detection of pulses in the legs. This test is painless and checks to see if you have a pulses in your legs/feet.  A dye or contrast material (a substance that highlights the blood vessels so they show up on x-ray) may be given to help your caregiver better see the arteries for the following tests. The dye is eliminated from your body by the kidney's. Your caregiver may order blood work to check your kidney function and other laboratory values before the following tests are performed:  Magnetic Resonance Angiography (MRA). An MRA is a picture study of the blood vessels and arteries. The MRA machine uses a large magnet to produce images of the blood vessels.  Computed Tomography Angiography (CTA). A CTA is a   specialized x-ray that looks at how the blood flows in your blood vessels. An IV may be inserted into your arm so contrast dye can be injected.  Angiogram. Is a procedure that uses x-rays to look at your blood vessels. This procedure is minimally invasive, meaning a small incision (cut) is made in your groin. A small tube (catheter) is then inserted into the artery of your groin. The catheter is  guided to the blood vessel or artery your caregiver wants to examine. Contrast dye is injected into the catheter. X-rays are then taken of the blood vessel or artery. After the images are obtained, the catheter is taken out. TREATMENT  Treatment of PVD involves many interventions which may include:  Lifestyle changes:  Quitting smoking.  Exercise.  Following a low fat, low cholesterol diet.  Control of diabetes.  Foot care is very important to the PVD patient. Good foot care can help prevent infection.  Medication:  Cholesterol-lowering medicine.  Blood pressure medicine.  Anti-platelet drugs.  Certain medicines may reduce symptoms of Intermittent Claudication.  Interventional/Surgical options:  Angioplasty. An Angioplasty is a procedure that inflates a balloon in the blocked artery. This opens the blocked artery to improve blood flow.  Stent Implant. A wire mesh tube (stent) is placed in the artery. The stent expands and stays in place, allowing the artery to remain open.  Peripheral Bypass Surgery. This is a surgical procedure that reroutes the blood around a blocked artery to help improve blood flow. This type of procedure may be performed if Angioplasty or stent implants are not an option. SEEK IMMEDIATE MEDICAL CARE IF:   You develop pain or numbness in your arms or legs.  Your arm or leg turns cold, becomes blue in color.  You develop redness, warmth, swelling and pain in your arms or legs. MAKE SURE YOU:   Understand these instructions.  Will watch your condition.  Will get help right away if you are not doing well or get worse. Document Released: 04/14/2004 Document Revised: 05/30/2011 Document Reviewed: 03/11/2008 [employer] Patient Information 2014 [employer].  

## 2012-12-28 NOTE — Addendum Note (Signed)
Addended by: Sharee Pimple on: 12/28/2012 09:37 AM   Modules accepted: Orders

## 2012-12-28 NOTE — Addendum Note (Signed)
Addended by: Sharee Pimple on: 12/28/2012 01:32 PM   Modules accepted: Orders

## 2013-01-03 ENCOUNTER — Telehealth: Payer: Self-pay

## 2013-01-03 NOTE — Telephone Encounter (Signed)
Phone call from pt.  Reported episode of a shooting pain in right lower extremity last night at bedtime.  Described having a "stinging pain" in the right calf, and when she picked her right leg up to place it on her bed the pain shot up into her right groin.   Stated there "is always a little swelling and tenderness in the right lower leg."  Reported she took Ibuprofen for pain last night, and has no pain in right leg today.  Stated she had similar episode in the left leg today; described a shooting pain in the left ankle that shot up to the left groin x1.  Stated the pain in the left groin lingered a little while.  Denies swelling in the left leg.  Reported both feet and lower legs feel hot.  Denies any cold temperature change in either foot.   Questioned if walking makes the pain worse;  pt. stated "oh yes, I can hardly walk".  Denies any open sores at this time.  Reported she took Ibuprofen today for the pain in the left leg, and the pain has subsided. Discussed pt's symptoms with Dr. Darrick Penna.  Recommended pt. to have evaluation of leg pain by her PCP, since this is a migratory type pain.  Advised pt. of Dr. Darrick Penna recommendations.  Verb. Understanding.

## 2013-01-14 ENCOUNTER — Ambulatory Visit (INDEPENDENT_AMBULATORY_CARE_PROVIDER_SITE_OTHER): Payer: PRIVATE HEALTH INSURANCE | Admitting: Ophthalmology

## 2013-01-14 DIAGNOSIS — E1139 Type 2 diabetes mellitus with other diabetic ophthalmic complication: Secondary | ICD-10-CM

## 2013-01-14 DIAGNOSIS — H43819 Vitreous degeneration, unspecified eye: Secondary | ICD-10-CM

## 2013-01-14 DIAGNOSIS — H35039 Hypertensive retinopathy, unspecified eye: Secondary | ICD-10-CM

## 2013-01-14 DIAGNOSIS — I10 Essential (primary) hypertension: Secondary | ICD-10-CM

## 2013-01-14 DIAGNOSIS — E11319 Type 2 diabetes mellitus with unspecified diabetic retinopathy without macular edema: Secondary | ICD-10-CM

## 2013-01-14 DIAGNOSIS — E11311 Type 2 diabetes mellitus with unspecified diabetic retinopathy with macular edema: Secondary | ICD-10-CM

## 2013-01-17 ENCOUNTER — Other Ambulatory Visit: Payer: Self-pay | Admitting: Internal Medicine

## 2013-01-17 ENCOUNTER — Other Ambulatory Visit: Payer: Self-pay | Admitting: Nurse Practitioner

## 2013-01-23 ENCOUNTER — Other Ambulatory Visit: Payer: Self-pay | Admitting: Nurse Practitioner

## 2013-01-30 ENCOUNTER — Ambulatory Visit (INDEPENDENT_AMBULATORY_CARE_PROVIDER_SITE_OTHER): Payer: PRIVATE HEALTH INSURANCE | Admitting: Internal Medicine

## 2013-01-30 ENCOUNTER — Encounter: Payer: Self-pay | Admitting: Internal Medicine

## 2013-01-30 ENCOUNTER — Encounter (INDEPENDENT_AMBULATORY_CARE_PROVIDER_SITE_OTHER): Payer: Self-pay

## 2013-01-30 VITALS — BP 180/70 | HR 67 | Temp 97.7°F | Wt 138.7 lb

## 2013-01-30 DIAGNOSIS — E1159 Type 2 diabetes mellitus with other circulatory complications: Secondary | ICD-10-CM

## 2013-01-30 DIAGNOSIS — I10 Essential (primary) hypertension: Secondary | ICD-10-CM

## 2013-01-30 DIAGNOSIS — E1151 Type 2 diabetes mellitus with diabetic peripheral angiopathy without gangrene: Secondary | ICD-10-CM | POA: Insufficient documentation

## 2013-01-30 DIAGNOSIS — I798 Other disorders of arteries, arterioles and capillaries in diseases classified elsewhere: Secondary | ICD-10-CM

## 2013-01-30 DIAGNOSIS — J309 Allergic rhinitis, unspecified: Secondary | ICD-10-CM

## 2013-01-30 DIAGNOSIS — E785 Hyperlipidemia, unspecified: Secondary | ICD-10-CM

## 2013-01-30 DIAGNOSIS — Z23 Encounter for immunization: Secondary | ICD-10-CM | POA: Insufficient documentation

## 2013-01-30 MED ORDER — LISINOPRIL 5 MG PO TABS: 5.0000 mg | ORAL_TABLET | Freq: Every day | ORAL | Status: DC

## 2013-01-30 MED ORDER — FLUTICASONE PROPIONATE 50 MCG/ACT NA SUSP: 2.0000 | Freq: Every day | NASAL | Status: DC

## 2013-01-30 MED ORDER — CETIRIZINE HCL 5 MG PO TABS: 5.0000 mg | ORAL_TABLET | Freq: Every day | ORAL | Status: DC

## 2013-01-30 NOTE — Progress Notes (Signed)
Patient ID: [name], female   DOB: February 28, 1928, 79 y.o.   MRN: [id number]  Chief Complaint  Patient presents with  . Medical Managment of Chronic Issues    4 month f/u  . Immunizations    needs flu, ? Pneumo, and Tdap    Allergies  Allergen Reactions  . Penicillins Hives  . Sulfa Antibiotics Other (See Comments)    Tongue swells   HPI Patient here with her daughter. She complaints of runny nose with clear discharge for past few weeks. sje also has been having elevated bp at home recently with SBP between 150-180 mostly. DBP < 90. No other complaints this visit Reviewed home cbg- 90-130.   Review of Systems   Constitutional: Negative for fever, chills, activity change and appetite change.   HENT: Negative for congestion.    Eyes: Negative for discharge and visual disturbance.   Respiratory: Negative for cough and shortness of breath.    Cardiovascular: Negative for chest pain and palpitations.   Gastrointestinal: Negative for abdominal pain, diarrhea, blood in stool and abdominal distention.   Endocrine: Negative for polydipsia, polyphagia and polyuria.   Genitourinary: Negative for dysuria and vaginal discharge.   Musculoskeletal: Positive for arthralgias. Negative for back pain and gait problem.   Neurological: Negative for dizziness, syncope and headaches.   Hematological: Negative for adenopathy.   Psychiatric/Behavioral: Negative for behavioral problems and agitation.   Past Medical History  Diagnosis Date  . Coronary artery disease     Remote PCI. Had BMS to RCA and DES stent to Diagonal in Jan. 2012   . Diabetes mellitus     long standing  . Diastolic heart failure     EF 55 to 60% per cath in Jan 2012  . Hyperlipidemia   . Hypertension   . Carotid bruit   . Gout   . Obesity   . TIA (transient ischemic attack)   . Chronic kidney disease     Stage 3  . Ischemic cardiomyopathy     with prior EF of 35%  . Anemia   . Proteinuria   . NSTEMI (non-ST elevated  myocardial infarction) Jan 2012    with PCI to RCA and DX per Dr. Excell Seltzer  . Carotid artery occlusion    Past Surgical History  Procedure Laterality Date  . Appendectomy    . Tonsillectomy    . Left ankle fusion    . Distal rca  04/05/2010    stent  . Fracture surgery Right 2005    bilateral ankles    Current Outpatient Prescriptions on File Prior to Visit  Medication Sig Dispense Refill  . aspirin 81 MG tablet Take 81 mg by mouth daily.        Marland Kitchen BROMDAY 0.09 % SOLN Place 1 drop into the left eye daily.      . carvedilol (COREG) 3.125 MG tablet TAKE 1 TABLET BY MOUTH TWICE DAILY FOR BLOOD PRESSURE  60 tablet  3  . cloNIDine (CATAPRES) 0.1 MG tablet TAKE 1 TABLET BY MOUTH EVERY DAY  30 tablet  3  . colesevelam (WELCHOL) 625 MG tablet Take 1,875 mg by mouth 2 (two) times daily.       Marland Kitchen doxycycline (VIBRAMYCIN) 50 MG capsule Take 1 capsule (50 mg total) by mouth 2 (two) times daily.  20 capsule  0  . EASY TOUCH PEN NEEDLES 32G X 6 MM MISC USE 1 NEEDLE AT BEDTIME  100 each  1  . FREESTYLE LITE test strip  CHECK BLOOD SUGAR DAILY  50 each  1  . LOTEMAX 0.5 % ophthalmic suspension Place 1 drop into the left eye 3 (three) times daily.       Marland Kitchen LOVAZA 1 G capsule TAKE 2 CAPSULES BY MOUTH EVERY NIGHT AT BEDTIME FOR 30 DAYS  60 capsule  3  . nitroGLYCERIN (NITROSTAT) 0.4 MG SL tablet Place 1 tablet (0.4 mg total) under the tongue every 5 (five) minutes as needed.  25 tablet  11  . polyethylene glycol (MIRALAX / GLYCOLAX) packet Take 17 g by mouth daily.      Marland Kitchen PRODIGY TWIST TOP LANCETS 28G MISC CHECK BLOOD SUGAR DAILY  100 each  1  . VICTOZA 18 MG/3ML SOPN INJECT 1.2MG  SUBCUTANEOUSLY EVERY DAY  3 mL  6  . VYTORIN 10-20 MG per tablet TAKE 1/2 TABLET BY MOUTH EVERY NIGHT AT BEDTIME FOR 30 DAYS  15 tablet  3   No current facility-administered medications on file prior to visit.    Physical exam BP 180/70  Pulse 67  Temp(Src) 97.7 F (36.5 C) (Oral)  Wt 138 lb 11.2 oz (62.914 kg)  SpO2  99%  Constitutional: She is oriented to person, place, and time. She appears well-developed and well-nourished. No distress.  obese   HENT:   Head: Normocephalic and atraumatic.   Mouth/Throat: Oropharynx is clear and moist.   Eyes: Conjunctivae are normal. Pupils are equal, round, and reactive to light.   Neck: Normal range of motion. Neck supple.   Cardiovascular: Normal rate, regular rhythm and normal heart sounds.    No murmur heard. Pulmonary/Chest: Effort normal and breath sounds normal. No respiratory distress. She has no wheezes.   Abdominal: Soft. Bowel sounds are normal. She exhibits no distension.  Musculoskeletal: Normal range of motion. She exhibits edema. She exhibits no tenderness.  Neurological: She is alert and oriented to person, place, and time.   Skin: Skin is warm and dry. She is not diaphoretic.  Psychiatric: She has a normal mood and affect.   Labs- CBC    Component Value Date/Time   WBC 3.9* 06/18/2010 1314   RBC 3.01* 06/18/2010 1314   HGB 9.6* 06/18/2010 1314   HCT 27.4* 06/18/2010 1314   PLT 180.0 06/18/2010 1314   MCV 90.9 06/18/2010 1314   MCH 30.2 04/15/2010 1412   MCHC 35.1 06/18/2010 1314   RDW 12.9 06/18/2010 1314   LYMPHSABS 1.2 06/18/2010 1314   MONOABS 0.4 06/18/2010 1314   EOSABS 0.1 06/18/2010 1314   BASOSABS 0.0 06/18/2010 1314    CMP     Component Value Date/Time   NA 142 10/03/2012 1146   NA 143 04/17/2010 0502   K 4.4 10/03/2012 1146   CL 107 10/03/2012 1146   CO2 24 10/03/2012 1146   GLUCOSE 127* 10/03/2012 1146   GLUCOSE 132* 04/17/2010 0502   BUN 28* 10/03/2012 1146   BUN 21 04/17/2010 0502   CREATININE 1.30* 10/03/2012 1146   CALCIUM 9.1 10/03/2012 1146   PROT 5.8* 04/15/2010 1412   ALBUMIN 3.1* 04/15/2010 1412   AST 16 04/15/2010 1412   ALT 14 04/15/2010 1412   ALKPHOS 44 04/15/2010 1412   BILITOT 0.5 04/15/2010 1412   GFRNONAA 38* 10/03/2012 1146   GFRAA 44* 10/03/2012 1146     Lab Results  Component Value Date   HGBA1C 6.9* 10/03/2012    Lab Results  Component Value Date   TSH 2.297 04/04/2010   Assessment/plan  1. Hypertension Continue current regimen of coreg and  clonidine and will add lisinopril 5 mg daily for . Monitor bp at home, reassess in 4 weeks in the office  2. Hyperlipidemia Continue vytorin, welchol, check lipid panel  3. DM (diabetes mellitus), type 2 with peripheral vascular complications - Microalbumin/Creatinine Ratio, Urine - Hemoglobin A1c - continue victoza for now - continue ASA  4. Allergic rhinitis Will start her on flonase and zyrtec 5 mg daily at bedtime and reassess next visit  5. Need for prophylactic vaccination and inoculation against influenza Flu vaccine to be provided

## 2013-01-31 ENCOUNTER — Ambulatory Visit (INDEPENDENT_AMBULATORY_CARE_PROVIDER_SITE_OTHER): Payer: PRIVATE HEALTH INSURANCE | Admitting: Nurse Practitioner

## 2013-01-31 VITALS — BP 180/90 | HR 70 | Wt 139.2 lb

## 2013-01-31 DIAGNOSIS — I10 Essential (primary) hypertension: Secondary | ICD-10-CM

## 2013-01-31 DIAGNOSIS — F411 Generalized anxiety disorder: Secondary | ICD-10-CM

## 2013-01-31 LAB — COMPREHENSIVE METABOLIC PANEL
AST: 16 IU/L (ref 0–40)
Alkaline Phosphatase: 57 IU/L (ref 39–117)
BUN/Creatinine Ratio: 28 — ABNORMAL HIGH (ref 11–26)
CO2: 23 mmol/L (ref 18–29)
Chloride: 108 mmol/L (ref 97–108)
Potassium: 4.1 mmol/L (ref 3.5–5.2)
Sodium: 146 mmol/L — ABNORMAL HIGH (ref 134–144)
Total Bilirubin: 0.4 mg/dL (ref 0.0–1.2)

## 2013-01-31 LAB — MICROALBUMIN / CREATININE URINE RATIO
Creatinine, Ur: 58.4 mg/dL (ref 15.0–278.0)
MICROALB/CREAT RATIO: 1934.6 mg/g creat — ABNORMAL HIGH (ref 0.0–30.0)
Microalbumin, Urine: 1129.8 ug/mL — ABNORMAL HIGH (ref 0.0–17.0)

## 2013-01-31 LAB — LIPID PANEL
Cholesterol, Total: 126 mg/dL (ref 100–199)
Triglycerides: 80 mg/dL (ref 0–149)

## 2013-01-31 LAB — HEMOGLOBIN A1C: Hgb A1c MFr Bld: 6.7 % — ABNORMAL HIGH (ref 4.8–5.6)

## 2013-01-31 MED ORDER — LISINOPRIL 10 MG PO TABS: 10.0000 mg | ORAL_TABLET | Freq: Every day | ORAL | Status: DC

## 2013-01-31 MED ORDER — CITALOPRAM HYDROBROMIDE 20 MG PO TABS: 20.0000 mg | ORAL_TABLET | Freq: Every day | ORAL | Status: DC

## 2013-01-31 MED ORDER — ALPRAZOLAM 0.25 MG PO TABS: 0.2500 mg | ORAL_TABLET | Freq: Two times a day (BID) | ORAL | Status: DC | PRN

## 2013-01-31 NOTE — Patient Instructions (Addendum)
To take lisinopril 10 mg daily (prescription is for 5- take 2 of these pills)  Will start Celexa 20 mg to take this daily for anxiety Will give you prescription for xanax 0.25 mg may take this twice daily as needed for overwhelming anxiety   Follow up with Shanda Bumps NP in 2 weeks; will need follow up on blood pressure, anxiety and lab work

## 2013-01-31 NOTE — Progress Notes (Signed)
Patient ID: [name], female   DOB: 05/12/1927, 79 y.o.   MRN: [id number]   Allergies  Allergen Reactions  . Penicillins Hives  . Sulfa Antibiotics Other (See Comments)    Tongue swells    Chief Complaint  Patient presents with  . Follow-up    bp check    HPI: Patient is a 79 y.o. female seen in the office today for blood pressure check but due to elevation in blood pressure she was seen by provider; had lisinopril prescribed yesterday and was not able to get prescription and it should be delivered today;  pt reports she has had chest pain in the past 2 weeks but contributed this to GERD; pain when into her back; no shortness of breath, no diaphoresis, no weakness or other neurological symptoms. Has felt uneasy about blood pressure being so high. Saw Pandey yesterday who added lisinopril but she has not gotten this medication yet; is under a lot of stress at home due to a man that is currently living with her. She does not want to evict him due to "feeling sorry" for this individual.  She has had a remote inferior MI in 1996 and is s/p PTCA with 3 stents to the RCA. She had recurrent NSTEMI in January of 2012 and had BMS to the RCA and DES to the diagonal Bilateral positive for Carotid Bruits known and being seen by vascular  Overwhelming amount of anxiety per daughter; pt gets tearful when she talks about the stress and anxiety at home. No thoughts of hurting herself or others; daughter reports she thinks she needs medication for this; pt agrees.   Review of Systems:  Review of Systems  Constitutional: Negative for fever, chills and malaise/fatigue.  HENT: Negative for tinnitus.   Eyes: Negative.   Respiratory: Negative for cough, sputum production, shortness of breath and wheezing.   Cardiovascular: Positive for chest pain (one episode while sitting ). Negative for palpitations and leg swelling.  Gastrointestinal: Negative for heartburn, nausea, vomiting and abdominal pain.   Musculoskeletal: Negative for myalgias.  Neurological: Negative for dizziness, sensory change, focal weakness, weakness and headaches.  Psychiatric/Behavioral: Negative for suicidal ideas. The patient is nervous/anxious.      Past Medical History  Diagnosis Date  . Coronary artery disease     Remote PCI. Had BMS to RCA and DES stent to Diagonal in Jan. 2012   . Diabetes mellitus     long standing  . Diastolic heart failure     EF 55 to 60% per cath in Jan 2012  . Hyperlipidemia   . Hypertension   . Carotid bruit   . Gout   . Obesity   . TIA (transient ischemic attack)   . Chronic kidney disease     Stage 3  . Ischemic cardiomyopathy     with prior EF of 35%  . Anemia   . Proteinuria   . NSTEMI (non-ST elevated myocardial infarction) Jan 2012    with PCI to RCA and DX per Dr. Excell Seltzer  . Carotid artery occlusion    Past Surgical History  Procedure Laterality Date  . Appendectomy    . Tonsillectomy    . Left ankle fusion    . Distal rca  04/05/2010    stent  . Fracture surgery Right 2005    bilateral ankles    Social History:   reports that she quit smoking about 44 years ago. She has never used smokeless tobacco. She reports that she does  not drink alcohol or use illicit drugs.  No family history on file.  Medications: Patient's Medications  New Prescriptions   No medications on file  Previous Medications   ASPIRIN 81 MG TABLET    Take 81 mg by mouth daily.     BROMDAY 0.09 % SOLN    Place 1 drop into the left eye daily.   CARVEDILOL (COREG) 3.125 MG TABLET    TAKE 1 TABLET BY MOUTH TWICE DAILY FOR BLOOD PRESSURE   CETIRIZINE (ZYRTEC) 5 MG TABLET    Take 1 tablet (5 mg total) by mouth daily. At bedtime   CLONIDINE (CATAPRES) 0.1 MG TABLET    TAKE 1 TABLET BY MOUTH EVERY DAY   COLESEVELAM (WELCHOL) 625 MG TABLET    Take 1,875 mg by mouth 2 (two) times daily.    DOXYCYCLINE (VIBRAMYCIN) 50 MG CAPSULE    Take 1 capsule (50 mg total) by mouth 2 (two) times daily.    EASY TOUCH PEN NEEDLES 32G X 6 MM MISC    USE 1 NEEDLE AT BEDTIME   FLUTICASONE (FLONASE) 50 MCG/ACT NASAL SPRAY    Place 2 sprays into both nostrils daily.   FREESTYLE LITE TEST STRIP    CHECK BLOOD SUGAR DAILY   LISINOPRIL (PRINIVIL,ZESTRIL) 5 MG TABLET    Take 1 tablet (5 mg total) by mouth daily.   LOTEMAX 0.5 % OPHTHALMIC SUSPENSION    Place 1 drop into the left eye 3 (three) times daily.    LOVAZA 1 G CAPSULE    TAKE 2 CAPSULES BY MOUTH EVERY NIGHT AT BEDTIME FOR 30 DAYS   NITROGLYCERIN (NITROSTAT) 0.4 MG SL TABLET    Place 1 tablet (0.4 mg total) under the tongue every 5 (five) minutes as needed.   POLYETHYLENE GLYCOL (MIRALAX / GLYCOLAX) PACKET    Take 17 g by mouth daily.   PRODIGY TWIST TOP LANCETS 28G MISC    CHECK BLOOD SUGAR DAILY   VICTOZA 18 MG/3ML SOPN    INJECT 1.2MG  SUBCUTANEOUSLY EVERY DAY   VYTORIN 10-20 MG PER TABLET    TAKE 1/2 TABLET BY MOUTH EVERY NIGHT AT BEDTIME FOR 30 DAYS  Modified Medications   No medications on file  Discontinued Medications   No medications on file     Physical Exam:  Filed Vitals:   01/31/13 1153  BP: 202/84  Pulse: 70  Weight: 139 lb 3.2 oz (63.141 kg)  SpO2: 95%    Physical Exam  Constitutional: She is oriented to person, place, and time and well-developed, well-nourished, and in no distress. No distress.  HENT:  Head: Normocephalic and atraumatic.  Eyes: Conjunctivae and EOM are normal. Pupils are equal, round, and reactive to light.  Neck: Normal range of motion. Neck supple. No thyromegaly present.  Cardiovascular: Normal rate, regular rhythm and normal heart sounds.   Pulmonary/Chest: Effort normal and breath sounds normal. No respiratory distress.  Abdominal: Soft. Bowel sounds are normal. She exhibits no distension.  Neurological: She is alert and oriented to person, place, and time. No cranial nerve deficit. Gait normal. Coordination normal.  Skin: Skin is warm and dry. She is not diaphoretic.     Labs  reviewed: Basic Metabolic Panel:  Recent Labs  16/10/96 1756 10/03/12 1146 01/30/13 1215  NA 145* 142 146*  K 5.3* 4.4 4.1  CL 109* 107 108  CO2 25 24 23   GLUCOSE 87 127* 114*  BUN 31* 28* 34*  CREATININE 1.09* 1.30* 1.23*  CALCIUM 9.2 9.1 9.0  Liver Function Tests:  Recent Labs  01/30/13 1215  AST 16  ALT 8  ALKPHOS 57  BILITOT 0.4  PROT 5.6*   No results found for this basename: LIPASE, AMYLASE,  in the last 8760 hours No results found for this basename: AMMONIA,  in the last 8760 hours CBC: No results found for this basename: WBC, NEUTROABS, HGB, HCT, MCV, PLT,  in the last 8760 hours Lipid Panel:  Recent Labs  01/30/13 1215  HDL 58  LDLCALC 52  TRIG 80  CHOLHDL 2.2     Assessment/Plan .1. Accelerated hypertension - blood pressure improved to 180/90 on recheck after clonidine 0.1 mg given ; instructed to take lisinopril when she gets home - lisinopril (PRINIVIL,ZESTRIL) 10 MG tablet; Take 1 tablet (10 mg total) by mouth daily.  Dispense: 30 tablet; Refill: 1 - EKG 12-Lead which showed no changes from April 2014  2. Anxiety state, unspecified - ALPRAZolam (XANAX) 0.25 MG tablet; Take 1 tablet (0.25 mg total) by mouth 2 (two) times daily as needed for anxiety.  Dispense: 20 tablet; Refill: 0 - citalopram (CELEXA) 20 MG tablet; Take 1 tablet (20 mg total) by mouth daily.  Dispense: 30 tablet; Refill: 3 -pt given instructions on when to stop medication and call; side effects; pt understands  Pt to follow up in 2 weeks for follow up on bp, anxiety and blood work

## 2013-02-01 ENCOUNTER — Other Ambulatory Visit: Payer: Self-pay | Admitting: *Deleted

## 2013-02-04 ENCOUNTER — Telehealth: Payer: Self-pay | Admitting: *Deleted

## 2013-02-04 NOTE — Telephone Encounter (Signed)
Patient called and stated that she needed to talk with Shanda Bumps that it was an emergency. She stated that her BP was 285/98. Told her that she needed to call 911 and go to the ER. She agreed and stated that she will call.

## 2013-02-06 ENCOUNTER — Other Ambulatory Visit: Payer: Self-pay | Admitting: *Deleted

## 2013-02-06 ENCOUNTER — Encounter: Payer: Self-pay | Admitting: *Deleted

## 2013-02-06 MED ORDER — COLESEVELAM HCL 625 MG PO TABS: ORAL_TABLET | ORAL | Status: DC

## 2013-02-13 ENCOUNTER — Other Ambulatory Visit: Payer: Self-pay | Admitting: Nurse Practitioner

## 2013-02-21 ENCOUNTER — Ambulatory Visit (INDEPENDENT_AMBULATORY_CARE_PROVIDER_SITE_OTHER): Payer: PRIVATE HEALTH INSURANCE | Admitting: Nurse Practitioner

## 2013-02-21 ENCOUNTER — Encounter: Payer: Self-pay | Admitting: Nurse Practitioner

## 2013-02-21 ENCOUNTER — Other Ambulatory Visit: Payer: Self-pay | Admitting: Nurse Practitioner

## 2013-02-21 ENCOUNTER — Other Ambulatory Visit: Payer: Self-pay | Admitting: Internal Medicine

## 2013-02-21 VITALS — BP 130/70 | HR 64 | Temp 97.3°F | Resp 14 | Wt 135.8 lb

## 2013-02-21 DIAGNOSIS — I10 Essential (primary) hypertension: Secondary | ICD-10-CM

## 2013-02-21 DIAGNOSIS — F419 Anxiety disorder, unspecified: Secondary | ICD-10-CM | POA: Insufficient documentation

## 2013-02-21 DIAGNOSIS — R350 Frequency of micturition: Secondary | ICD-10-CM

## 2013-02-21 DIAGNOSIS — F411 Generalized anxiety disorder: Secondary | ICD-10-CM

## 2013-02-21 DIAGNOSIS — Z23 Encounter for immunization: Secondary | ICD-10-CM

## 2013-02-21 MED ORDER — CITALOPRAM HYDROBROMIDE 20 MG PO TABS: 20.0000 mg | ORAL_TABLET | Freq: Every day | ORAL | Status: DC

## 2013-02-21 NOTE — Progress Notes (Signed)
Patient ID: [name], female   DOB: Nov 10, 1927, 79 y.o.   MRN: [id number]    Allergies  Allergen Reactions  . Penicillins Hives  . Sulfa Antibiotics Other (See Comments)    Tongue swells    Chief Complaint  Patient presents with  . Medical Managment of Chronic Issues    3 week f/u  . other    BP in morning before taking medication in am 189/72.    HPI: Patient is a 79 y.o. female seen in the office today for blood pressure follow up and anxiety Takes blood pressure in morning before she takes her medication and it is always high but after her medication starts working it is better and most of the time under 140/90 Was started on celexa 20 mg and xanax at last visit; has been taking xanax every night  Daughter reports she is more drowsy  Overall anxiety is better  Reports increase feeling like she needs to urinate but not actually going with lower back discomfort - has been increasing water intake to help  Denies pain with urination  Review of Systems:  Review of Systems  Constitutional: Negative for malaise/fatigue.  Respiratory: Negative for shortness of breath.   Cardiovascular: Negative for chest pain, palpitations and leg swelling.  Gastrointestinal: Negative for diarrhea and constipation.  Genitourinary: Negative for dysuria, urgency and frequency.  Skin: Negative.   Neurological: Negative for weakness and headaches.  Psychiatric/Behavioral: Negative for depression. The patient is not nervous/anxious.      Past Medical History  Diagnosis Date  . Coronary artery disease     Remote PCI. Had BMS to RCA and DES stent to Diagonal in Jan. 2012   . Diabetes mellitus     long standing  . Diastolic heart failure     EF 55 to 60% per cath in Jan 2012  . Hyperlipidemia   . Hypertension   . Carotid bruit   . Gout   . Obesity   . TIA (transient ischemic attack)   . Chronic kidney disease     Stage 3  . Ischemic cardiomyopathy     with prior EF of 35%  . Anemia     . Proteinuria   . NSTEMI (non-ST elevated myocardial infarction) Jan 2012    with PCI to RCA and DX per Dr. Excell Seltzer  . Carotid artery occlusion    Past Surgical History  Procedure Laterality Date  . Appendectomy    . Tonsillectomy    . Left ankle fusion    . Distal rca  04/05/2010    stent  . Fracture surgery Right 2005    bilateral ankles    Social History:   reports that she quit smoking about 44 years ago. She has never used smokeless tobacco. She reports that she does not drink alcohol or use illicit drugs.  History reviewed. No pertinent family history.  Medications: Patient's Medications  New Prescriptions   No medications on file  Previous Medications   ALPRAZOLAM (XANAX) 0.25 MG TABLET    TAKE 1 TABLET BY MOUTH TWICE DAILY AS NEEDED FOR ANXIETY   ASPIRIN 81 MG TABLET    Take 81 mg by mouth daily.     BROMDAY 0.09 % SOLN    Place 1 drop into the left eye daily.   CARVEDILOL (COREG) 3.125 MG TABLET    TAKE 1 TABLET BY MOUTH TWICE DAILY FOR BLOOD PRESSURE   CETIRIZINE (ZYRTEC) 5 MG TABLET    TAKE 1 TABLET BY  MOUTH ONCE DAILY AT BEDTIME   CITALOPRAM (CELEXA) 20 MG TABLET    Take 1 tablet (20 mg total) by mouth daily.   CLONIDINE (CATAPRES) 0.1 MG TABLET    TAKE 1 TABLET BY MOUTH EVERY DAY   COLESEVELAM (WELCHOL) 625 MG TABLET    Take 1 tablet (675 mg) twice daily with meals   EASY TOUCH PEN NEEDLES 32G X 6 MM MISC    USE 1 NEEDLE AT BEDTIME   FLUTICASONE (FLONASE) 50 MCG/ACT NASAL SPRAY    Place 2 sprays into both nostrils daily.   FREESTYLE LITE TEST STRIP    CHECK BLOOD SUGAR DAILY   LISINOPRIL (PRINIVIL,ZESTRIL) 10 MG TABLET    TAKE 1 TABLET BY MOUTH ONCE DAILY   LOTEMAX 0.5 % OPHTHALMIC SUSPENSION    Place 1 drop into the left eye 3 (three) times daily.    LOVAZA 1 G CAPSULE    TAKE 2 CAPSULES BY MOUTH EVERY NIGHT AT BEDTIME FOR 30 DAYS   NITROGLYCERIN (NITROSTAT) 0.4 MG SL TABLET    Place 1 tablet (0.4 mg total) under the tongue every 5 (five) minutes as needed.    POLYETHYLENE GLYCOL (MIRALAX / GLYCOLAX) PACKET    Take 17 g by mouth daily.   PRODIGY TWIST TOP LANCETS 28G MISC    CHECK BLOOD SUGAR DAILY   VICTOZA 18 MG/3ML SOPN    INJECT 1.2MG  SUBCUTANEOUSLY EVERY DAY   VYTORIN 10-20 MG PER TABLET    TAKE 1/2 TABLET BY MOUTH EVERY NIGHT AT BEDTIME FOR 30 DAYS  Modified Medications   No medications on file  Discontinued Medications   DOXYCYCLINE (VIBRAMYCIN) 50 MG CAPSULE    Take 1 capsule (50 mg total) by mouth 2 (two) times daily.     Physical Exam:  Filed Vitals:   02/21/13 1453  BP: 130/70  Pulse: 64  Temp: 97.3 F (36.3 C)  TempSrc: Oral  Resp: 14  Weight: 135 lb 12.8 oz (61.598 kg)  SpO2: 94%   Physical Exam  Constitutional: She is oriented to person, place, and time and well-developed, well-nourished, and in no distress. No distress.  HENT:  Head: Normocephalic and atraumatic.  Eyes: Conjunctivae and EOM are normal. Pupils are equal, round, and reactive to light.  Neck: Normal range of motion. Neck supple. No thyromegaly present.  Cardiovascular: Normal rate, regular rhythm and normal heart sounds.   Pulmonary/Chest: Effort normal and breath sounds normal. No respiratory distress.  Abdominal: Soft. Bowel sounds are normal. She exhibits no distension.  Musculoskeletal: Normal range of motion. She exhibits no edema and no tenderness.  No back pain on exam  Neurological: She is alert and oriented to person, place, and time. No cranial nerve deficit. Gait normal. Coordination normal.  Skin: Skin is warm and dry. She is not diaphoretic.  Psychiatric: Affect normal.     Labs reviewed: Basic Metabolic Panel:  Recent Labs  95/28/41 1756 10/03/12 1146 01/30/13 1215  NA 145* 142 146*  K 5.3* 4.4 4.1  CL 109* 107 108  CO2 25 24 23   GLUCOSE 87 127* 114*  BUN 31* 28* 34*  CREATININE 1.09* 1.30* 1.23*  CALCIUM 9.2 9.1 9.0   Liver Function Tests:  Recent Labs  01/30/13 1215  AST 16  ALT 8  ALKPHOS 57  BILITOT 0.4  PROT  5.6*   No results found for this basename: LIPASE, AMYLASE,  in the last 8760 hours No results found for this basename: AMMONIA,  in the last 8760 hours CBC:  No results found for this basename: WBC, NEUTROABS, HGB, HCT, MCV, PLT,  in the last 8760 hours Lipid Panel:  Recent Labs  01/30/13 1215  HDL 58  LDLCALC 52  TRIG 80  CHOLHDL 2.2   TSH: No results found for this basename: TSH,  in the last 8760 hours A1C: No components found with this basename: A1C,    Assessment/Plan 1. HTN (hypertension) -improved, educated to take blood pressure after medications - Basic metabolic panel  2. Anxiety state, unspecified -improved on celexa -educated to use xanax AS NEEDED for anxiety attacks only - citalopram (CELEXA) 20 MG tablet; Take 1 tablet (20 mg total) by mouth daily.  Dispense: 30 tablet; Refill: 3  3. Urinary frequency -encouraged good fluid intake  - will check Urinalysis and Urine culture at this time

## 2013-02-21 NOTE — Patient Instructions (Signed)
Blood pressure is better!  Only need to take blood pressure after you take your medications-  1-2 hours after medication has been given and you are sitting down relaxed for 5 mins  -use xanax AS NEEDED only for anxiety attacks  Will check blood work today  Follow up with in 3 months

## 2013-02-22 LAB — URINALYSIS
Bilirubin, UA: NEGATIVE
Glucose, UA: NEGATIVE
Ketones, UA: NEGATIVE
Leukocytes, UA: NEGATIVE
Nitrite, UA: NEGATIVE
RBC, UA: NEGATIVE
Specific Gravity, UA: 1.015 (ref 1.005–1.030)
Urobilinogen, Ur: 0.2 mg/dL (ref 0.0–1.9)
pH, UA: 6 (ref 5.0–7.5)

## 2013-02-22 LAB — BASIC METABOLIC PANEL
BUN: 25 mg/dL (ref 8–27)
CO2: 24 mmol/L (ref 18–29)
Calcium: 8.9 mg/dL (ref 8.6–10.2)
Chloride: 102 mmol/L (ref 97–108)
Glucose: 159 mg/dL — ABNORMAL HIGH (ref 65–99)
Potassium: 4.8 mmol/L (ref 3.5–5.2)
Sodium: 141 mmol/L (ref 134–144)

## 2013-02-23 LAB — URINE CULTURE

## 2013-02-25 ENCOUNTER — Other Ambulatory Visit: Payer: Self-pay | Admitting: *Deleted

## 2013-02-25 ENCOUNTER — Encounter (INDEPENDENT_AMBULATORY_CARE_PROVIDER_SITE_OTHER): Payer: PRIVATE HEALTH INSURANCE | Admitting: Ophthalmology

## 2013-02-25 ENCOUNTER — Ambulatory Visit (INDEPENDENT_AMBULATORY_CARE_PROVIDER_SITE_OTHER): Payer: PRIVATE HEALTH INSURANCE | Admitting: Nurse Practitioner

## 2013-02-25 ENCOUNTER — Telehealth: Payer: Self-pay | Admitting: Nurse Practitioner

## 2013-02-25 ENCOUNTER — Encounter: Payer: Self-pay | Admitting: Nurse Practitioner

## 2013-02-25 VITALS — BP 170/70 | HR 57 | Ht 62.0 in | Wt 133.8 lb

## 2013-02-25 DIAGNOSIS — E11311 Type 2 diabetes mellitus with unspecified diabetic retinopathy with macular edema: Secondary | ICD-10-CM

## 2013-02-25 DIAGNOSIS — I10 Essential (primary) hypertension: Secondary | ICD-10-CM

## 2013-02-25 DIAGNOSIS — H43819 Vitreous degeneration, unspecified eye: Secondary | ICD-10-CM

## 2013-02-25 DIAGNOSIS — E1139 Type 2 diabetes mellitus with other diabetic ophthalmic complication: Secondary | ICD-10-CM

## 2013-02-25 DIAGNOSIS — H35039 Hypertensive retinopathy, unspecified eye: Secondary | ICD-10-CM

## 2013-02-25 DIAGNOSIS — I259 Chronic ischemic heart disease, unspecified: Secondary | ICD-10-CM

## 2013-02-25 DIAGNOSIS — E11319 Type 2 diabetes mellitus with unspecified diabetic retinopathy without macular edema: Secondary | ICD-10-CM

## 2013-02-25 LAB — CBC WITH DIFFERENTIAL/PLATELET
Basophils Absolute: 0 10*3/uL (ref 0.0–0.1)
Basophils Relative: 0.4 % (ref 0.0–3.0)
Eosinophils Absolute: 0.1 10*3/uL (ref 0.0–0.7)
Eosinophils Relative: 1.7 % (ref 0.0–5.0)
HCT: 36 % (ref 36.0–46.0)
Hemoglobin: 12 g/dL (ref 12.0–15.0)
Lymphocytes Relative: 45.6 % (ref 12.0–46.0)
Lymphs Abs: 2.1 10*3/uL (ref 0.7–4.0)
MCHC: 33.3 g/dL (ref 30.0–36.0)
MCV: 91.1 fl (ref 78.0–100.0)
Monocytes Absolute: 0.3 10*3/uL (ref 0.1–1.0)
Monocytes Relative: 7.4 % (ref 3.0–12.0)
Neutro Abs: 2 10*3/uL (ref 1.4–7.7)
Neutrophils Relative %: 44.9 % (ref 43.0–77.0)
Platelets: 144 10*3/uL — ABNORMAL LOW (ref 150.0–400.0)
RBC: 3.95 Mil/uL (ref 3.87–5.11)
RDW: 14.5 % (ref 11.5–14.6)
WBC: 4.5 10*3/uL (ref 4.5–10.5)

## 2013-02-25 LAB — BASIC METABOLIC PANEL
BUN: 25 mg/dL — ABNORMAL HIGH (ref 6–23)
CO2: 27 mEq/L (ref 19–32)
Calcium: 8.9 mg/dL (ref 8.4–10.5)
Chloride: 104 mEq/L (ref 96–112)
Creatinine, Ser: 1.5 mg/dL — ABNORMAL HIGH (ref 0.4–1.2)
GFR: 36.31 mL/min — ABNORMAL LOW (ref >60.00)
Glucose, Bld: 113 mg/dL — ABNORMAL HIGH (ref 70–99)
Potassium: 4.3 mEq/L (ref 3.5–5.1)
Sodium: 139 mEq/L (ref 135–145)

## 2013-02-25 LAB — HEPATIC FUNCTION PANEL
ALT: 11 U/L (ref 0–35)
AST: 20 U/L (ref 0–37)
Albumin: 3.6 g/dL (ref 3.5–5.2)
Alkaline Phosphatase: 46 U/L (ref 39–117)
Bilirubin, Direct: 0.1 mg/dL (ref 0.0–0.3)
Total Bilirubin: 0.6 mg/dL (ref 0.3–1.2)
Total Protein: 6.2 g/dL (ref 6.0–8.3)

## 2013-02-25 LAB — LIPID PANEL
Cholesterol: 157 mg/dL (ref 0–200)
HDL: 49.9 mg/dL (ref >39.00)
LDL Cholesterol: 79 mg/dL (ref 0–99)
Total CHOL/HDL Ratio: 3
Triglycerides: 140 mg/dL (ref 0.0–149.0)
VLDL: 28 mg/dL (ref 0.0–40.0)

## 2013-02-25 MED ORDER — LISINOPRIL 10 MG PO TABS: 10.0000 mg | ORAL_TABLET | Freq: Two times a day (BID) | ORAL | Status: DC

## 2013-02-25 NOTE — Telephone Encounter (Signed)
S/w pt daughter to double check to make sure medication went to [facility], I stated that is were [facility] sent the medication too. Daughter stated appreciation

## 2013-02-25 NOTE — Telephone Encounter (Signed)
New problem,     Pt calling to confirm where her prescription is. Do we have [name] uses then now they deliver. NOT [facility] on [address]. Please give pt a call back.

## 2013-02-25 NOTE — Telephone Encounter (Signed)
New problem,  ° ° ° °Pt calling to confirm where her prescription is. Do we have [facility] then now they deliver. NOT [facility] on [address]. Please give pt a call back.  ° °  ° °

## 2013-02-25 NOTE — Patient Instructions (Addendum)
Stay on your current medicines but I am increasing your Lisinopril to 10 mg two times a day  I will see you in a month  We need to check labs today  Call the [facility] office at [phone] if you have any questions, problems or concerns.

## 2013-02-25 NOTE — Progress Notes (Signed)
[name] Date of Birth: 30-Jan-1928 Medical Record #[id number]  History of Present Illness: [name] is seen back today for a routine follow up visit. Seen for Dr. Excell Seltzer. She has multiple issues which include CAD, remote PCI, BMS to the RCA and DES to the DX in January of 2012. She has longstanding diabetes, HTN, HLD, diastolic heart failure with EF 55 to 60% per cath back in 2012 but has been as low as 35%, PVD (seeing Dr. Darrick Penna), gout, CKD and chronic anemia.   Seen back in July and seemed to be doing ok. Seen last week by PCP for some ongoing issues with anxiety/BP. Has had lisinopril added back along with some Xanax/Celexa. Having personal issues with a live in man that she does not want to evict.   Comes in today. Here alone. Brought today by her daughter [name], not [name] who usually comes with her. Moving slow. Seems a little more sedated. She is quite upset about her circumstances. Says her BP has been up for several months. Now on Lisinopril - some improvement but BP still up, especially in the morning. No real chest pain. Has had some heart racing. Mostly upset about Annette Stable, who she took in to her house several years back - he gives her no financial support - he will not leave. Her daughter [name] lives with her - also not helping to pay bills. She does not like [name] coming with her to her visits - says that then no one actually talks with [name] - makes her feel like an idiot. Says she is taking her medicines.    Current Outpatient Prescriptions  Medication Sig Dispense Refill  . ALPRAZolam (XANAX) 0.25 MG tablet TAKE 1 TABLET BY MOUTH TWICE DAILY AS NEEDED FOR ANXIETY  60 tablet  3  . aspirin 81 MG tablet Take 81 mg by mouth daily.        Marland Kitchen BROMDAY 0.09 % SOLN Place 1 drop into the left eye daily.      . carvedilol (COREG) 3.125 MG tablet TAKE 1 TABLET BY MOUTH TWICE DAILY FOR BLOOD PRESSURE  60 tablet  5  . cetirizine (ZYRTEC) 5 MG tablet TAKE 1 TABLET BY MOUTH ONCE DAILY AT BEDTIME   30 tablet  5  . citalopram (CELEXA) 20 MG tablet Take 1 tablet (20 mg total) by mouth daily.  30 tablet  3  . cloNIDine (CATAPRES) 0.1 MG tablet TAKE 1 TABLET BY MOUTH EVERY DAY  30 tablet  3  . colesevelam (WELCHOL) 625 MG tablet Take 1 tablet (675 mg) twice daily with meals  60 tablet  2  . EASY TOUCH PEN NEEDLES 32G X 6 MM MISC USE 1 NEEDLE AT BEDTIME  100 each  1  . fluticasone (FLONASE) 50 MCG/ACT nasal spray Place 2 sprays into both nostrils daily.  16 g  2  . FREESTYLE LITE test strip CHECK BLOOD SUGAR DAILY  50 each  1  . lisinopril (PRINIVIL,ZESTRIL) 10 MG tablet TAKE 1 TABLET BY MOUTH ONCE DAILY  30 tablet  5  . LOTEMAX 0.5 % ophthalmic suspension Place 1 drop into the left eye 3 (three) times daily.       Marland Kitchen LOVAZA 1 G capsule TAKE 2 CAPSULES BY MOUTH EVERY NIGHT AT BEDTIME FOR 30 DAYS  60 capsule  3  . nitroGLYCERIN (NITROSTAT) 0.4 MG SL tablet Place 1 tablet (0.4 mg total) under the tongue every 5 (five) minutes as needed.  25 tablet  11  .  polyethylene glycol (MIRALAX / GLYCOLAX) packet Take 17 g by mouth daily.      Marland Kitchen PRODIGY TWIST TOP LANCETS 28G MISC CHECK BLOOD SUGAR DAILY  100 each  1  . VICTOZA 18 MG/3ML SOPN INJECT 1.2MG  SUBCUTANEOUSLY EVERY DAY  3 mL  6  . VYTORIN 10-20 MG per tablet TAKE 1/2 TABLET BY MOUTH EVERY NIGHT AT BEDTIME FOR 30 DAYS  15 tablet  3   No current facility-administered medications for this visit.    Allergies  Allergen Reactions  . Penicillins Hives  . Sulfa Antibiotics Other (See Comments)    Tongue swells    Past Medical History  Diagnosis Date  . Coronary artery disease     Remote PCI. Had BMS to RCA and DES stent to Diagonal in Jan. 2012   . Diabetes mellitus     long standing  . Diastolic heart failure     EF 55 to 60% per cath in Jan 2012  . Hyperlipidemia   . Hypertension   . Carotid bruit   . Gout   . Obesity   . TIA (transient ischemic attack)   . Chronic kidney disease     Stage 3  . Ischemic cardiomyopathy     with prior  EF of 35%  . Anemia   . Proteinuria   . NSTEMI (non-ST elevated myocardial infarction) Jan 2012    with PCI to RCA and DX per Dr. Excell Seltzer  . Carotid artery occlusion     Past Surgical History  Procedure Laterality Date  . Appendectomy    . Tonsillectomy    . Left ankle fusion    . Distal rca  04/05/2010    stent  . Fracture surgery Right 2005    bilateral ankles     History  Smoking status  . Former Smoker  . Quit date: 03/21/1968  Smokeless tobacco  . Never Used    History  Alcohol Use No    History reviewed. No pertinent family history.  Review of Systems: The review of systems is per the HPI.  All other systems were reviewed and are negative.  Physical Exam: BP 170/70  Pulse 57  Ht 5\' 2"  (1.575 m)  Wt 133 lb 12.8 oz (60.691 kg)  BMI 24.47 kg/m2  SpO2 97% Recheck of her BP by me is 170/70.  Patient is pleasant and in no acute distress but she seems more sedate than normal in my opinion. Skin is warm and dry. Color is normal.  HEENT is unremarkable. Normocephalic/atraumatic. PERRL. Sclera are nonicteric. Neck is supple. No masses. No JVD. Lungs are clear. Cardiac exam shows a regular rate and rhythm. Abdomen is soft. Extremities are without edema. Gait and ROM are intact. No gross neurologic deficits noted.  LABORATORY DATA: PENDING   Lab Results  Component Value Date   WBC 3.9* 06/18/2010   HGB 9.6* 06/18/2010   HCT 27.4* 06/18/2010   PLT 180.0 06/18/2010   GLUCOSE 159* 02/21/2013   CHOL  Value: 108        ATP III CLASSIFICATION:  <200     mg/dL   Desirable  098-119  mg/dL   Borderline High  >=147    mg/dL   High        11/17/5619   TRIG 80 01/30/2013   HDL 58 01/30/2013   LDLCALC 52 01/30/2013   ALT 8 01/30/2013   AST 16 01/30/2013   NA 141 02/21/2013   K 4.8 02/21/2013   CL 102 02/21/2013  CREATININE 1.21* 02/21/2013   BUN 25 02/21/2013   CO2 24 02/21/2013   TSH 2.297 04/04/2010   INR 1.09 04/15/2010   HGBA1C 6.7* 01/30/2013     Assessment / Plan: 1.  CAD - no active chest pain reported.   2. HTN - BP still not controlled - Lisinopril is increased to BID  3. HLD - recheck her labs today  4. PVD - followed by [facility]  5. Diastolic HF - seems compensated.  6. Anxiety/situational stress - this seems to be the crux of her issue - not sure what to actually believe - I suspect [name] feels that there is some degree of dementia - Wyatt will not let me speak to either daughter at this time.   Patient is agreeable to this plan and will call if any problems develop in the interim.   Rosalio Macadamia, RN, ANP-C [facility] [address] [place], [place]  [address]

## 2013-03-08 ENCOUNTER — Other Ambulatory Visit: Payer: Self-pay | Admitting: *Deleted

## 2013-03-08 ENCOUNTER — Telehealth: Payer: Self-pay | Admitting: Nurse Practitioner

## 2013-03-08 MED ORDER — AMLODIPINE BESYLATE 5 MG PO TABS: 5.0000 mg | ORAL_TABLET | Freq: Every day | ORAL | Status: DC

## 2013-03-08 NOTE — Telephone Encounter (Signed)
S/w pt agreeable to plan will start Norvasc ( 5 mg ) daily that was sent in to [facility] per pt request Pt will continue to monitor bp at home stated if stays high to call me back after being on new medication.

## 2013-03-08 NOTE — Telephone Encounter (Signed)
S/w  Pt was ok on new medication till a couple days ago mouth remains so dry has to take cough drops, asked pt if hydrating stated had 3 cups of coffee this am I stated needs to drink water stated pt does. Bp readings; 12/15, 165/77, 12/16, 150/65, 12/17, 180/69, 12/18, 142/65, 12/19, 181/78. Pt states feels fine. I will route this to Norma Fredrickson, NP, for advice

## 2013-03-08 NOTE — Telephone Encounter (Signed)
It is the clonidine that is making her mouth dry - I would like for her to continue until we get her BP down further.  Add Norvasc 5 mg a day  Need to make sure she has follow up with me.

## 2013-03-08 NOTE — Telephone Encounter (Signed)
New Message  Pt called-- BP is still high-- No readings, requests a call back to discuss. ( Pt cannot hear properly)

## 2013-03-25 ENCOUNTER — Encounter: Payer: Self-pay | Admitting: Nurse Practitioner

## 2013-03-25 ENCOUNTER — Ambulatory Visit (INDEPENDENT_AMBULATORY_CARE_PROVIDER_SITE_OTHER): Payer: PRIVATE HEALTH INSURANCE | Admitting: Nurse Practitioner

## 2013-03-25 ENCOUNTER — Other Ambulatory Visit: Payer: Self-pay | Admitting: *Deleted

## 2013-03-25 VITALS — BP 150/80 | HR 54 | Ht 61.0 in | Wt 140.0 lb

## 2013-03-25 DIAGNOSIS — I10 Essential (primary) hypertension: Secondary | ICD-10-CM

## 2013-03-25 DIAGNOSIS — N189 Chronic kidney disease, unspecified: Secondary | ICD-10-CM

## 2013-03-25 LAB — BASIC METABOLIC PANEL
BUN: 29 mg/dL — ABNORMAL HIGH (ref 6–23)
CO2: 26 mEq/L (ref 19–32)
Calcium: 8.7 mg/dL (ref 8.4–10.5)
Chloride: 109 mEq/L (ref 96–112)
Creatinine, Ser: 1.3 mg/dL — ABNORMAL HIGH (ref 0.4–1.2)
GFR: 41.14 mL/min — ABNORMAL LOW (ref >60.00)
Glucose, Bld: 143 mg/dL — ABNORMAL HIGH (ref 70–99)
Potassium: 4.5 mEq/L (ref 3.5–5.1)
Sodium: 143 mEq/L (ref 135–145)

## 2013-03-25 NOTE — Progress Notes (Signed)
[name] Date of Birth: [birth year] Medical Record #[id number]  History of Present Illness: [name] is seen back today for a follow up visit. It is a one month check. Seen for Dr. Excell Seltzer. She has multiple issues which include CAD, remote PCI, BMS to the RCA and DES to the DX in January of 2012. She has longstanding diabetes, HTN, HLD, diastolic heart failure with EF 55 to 60% per cath back in 2012 but has been as low as 35%, PVD (seeing Dr. Darrick Penna), gout, CKD and chronic anemia.   Seen back in July and seemed to be doing ok. Seen back in November by PCP for some ongoing issues with anxiety/BP. Has had lisinopril added back along with some Xanax/Celexa. Having personal issues with a live in man that she does not want to evict as well as family issues in general.   Seen last month - BP still up.  Still with lots of social issues with her home situation. I increased her Lisinopril at that visit and in the interim we have added Norvasc.  Comes in today. Here alone. Doing much better. Does not seem as upset as she was the last time. Says her holidays were good. "Tuckered out from [employer]". BP has improved nicely. Her readings from home are reviewed. BP was up yesterday but she missed her medicines yesterday. No chest pain. Not short of breath. Overall, she is happy with how she is feeling.   Current Outpatient Prescriptions  Medication Sig Dispense Refill  . ALPRAZolam (XANAX) 0.25 MG tablet TAKE 1 TABLET BY MOUTH TWICE DAILY AS NEEDED FOR ANXIETY  60 tablet  3  . amLODipine (NORVASC) 5 MG tablet Take 1 tablet (5 mg total) by mouth daily.  30 tablet  9  . aspirin 81 MG tablet Take 81 mg by mouth daily.        Marland Kitchen BROMDAY 0.09 % SOLN Place 1 drop into the left eye daily.      . carvedilol (COREG) 3.125 MG tablet TAKE 1 TABLET BY MOUTH TWICE DAILY FOR BLOOD PRESSURE  60 tablet  5  . cetirizine (ZYRTEC) 5 MG tablet TAKE 1 TABLET BY MOUTH ONCE DAILY AT BEDTIME  30 tablet  5  . citalopram  (CELEXA) 20 MG tablet Take 1 tablet (20 mg total) by mouth daily.  30 tablet  3  . cloNIDine (CATAPRES) 0.1 MG tablet TAKE 1 TABLET BY MOUTH EVERY DAY  30 tablet  3  . colesevelam (WELCHOL) 625 MG tablet Take 1 tablet (675 mg) twice daily with meals  60 tablet  2  . EASY TOUCH PEN NEEDLES 32G X 6 MM MISC USE 1 NEEDLE AT BEDTIME  100 each  1  . fluticasone (FLONASE) 50 MCG/ACT nasal spray Place 2 sprays into both nostrils daily.  16 g  2  . FREESTYLE LITE test strip CHECK BLOOD SUGAR DAILY  50 each  1  . lisinopril (PRINIVIL,ZESTRIL) 10 MG tablet Take 1 tablet (10 mg total) by mouth 2 (two) times daily.  60 tablet  5  . LOTEMAX 0.5 % ophthalmic suspension Place 1 drop into the left eye 3 (three) times daily.       Marland Kitchen LOVAZA 1 G capsule TAKE 2 CAPSULES BY MOUTH EVERY NIGHT AT BEDTIME FOR 30 DAYS  60 capsule  3  . nitroGLYCERIN (NITROSTAT) 0.4 MG SL tablet Place 1 tablet (0.4 mg total) under the tongue every 5 (five) minutes as needed.  25 tablet  11  .  polyethylene glycol (MIRALAX / GLYCOLAX) packet Take 17 g by mouth daily.      Marland Kitchen PRODIGY TWIST TOP LANCETS 28G MISC CHECK BLOOD SUGAR DAILY  100 each  1  . VICTOZA 18 MG/3ML SOPN INJECT 1.2MG  SUBCUTANEOUSLY EVERY DAY  3 mL  6  . VYTORIN 10-20 MG per tablet TAKE 1/2 TABLET BY MOUTH EVERY NIGHT AT BEDTIME FOR 30 DAYS  15 tablet  3   No current facility-administered medications for this visit.    Allergies  Allergen Reactions  . Penicillins Hives  . Sulfa Antibiotics Other (See Comments)    Tongue swells    Past Medical History  Diagnosis Date  . Coronary artery disease     Remote PCI. Had BMS to RCA and DES stent to Diagonal in Jan. 2012   . Diabetes mellitus     long standing  . Diastolic heart failure     EF 55 to 60% per cath in Jan 2012  . Hyperlipidemia   . Hypertension   . Carotid bruit   . Gout   . Obesity   . TIA (transient ischemic attack)   . Chronic kidney disease     Stage 3  . Ischemic cardiomyopathy     with prior EF  of 35%  . Anemia   . Proteinuria   . NSTEMI (non-ST elevated myocardial infarction) Jan 2012    with PCI to RCA and DX per Dr. Excell Seltzer  . Carotid artery occlusion     Past Surgical History  Procedure Laterality Date  . Appendectomy    . Tonsillectomy    . Left ankle fusion    . Distal rca  04/05/2010    stent  . Fracture surgery Right 2005    bilateral ankles     History  Smoking status  . Former Smoker  . Quit date: 03/21/1968  Smokeless tobacco  . Never Used    History  Alcohol Use No    History reviewed. No pertinent family history.  Review of Systems: The review of systems is per the HPI.  All other systems were reviewed and are negative.  Physical Exam: BP 150/80  Pulse 54  Ht 5\' 1"  (1.549 m)  Wt 140 lb (63.504 kg)  BMI 26.47 kg/m2  SpO2 94% BP by me is 138/60. Patient is very pleasant and in no acute distress. Skin is warm and dry. Color is normal.  HEENT is unremarkable. Normocephalic/atraumatic. PERRL. Sclera are nonicteric. Neck is supple. No masses. No JVD. Lungs are clear. Cardiac exam shows a regular rate and rhythm. Abdomen is soft. Extremities are without edema. Gait and ROM are intact. No gross neurologic deficits noted.  LABORATORY DATA: BMET is pending  Lab Results  Component Value Date   WBC 4.5 02/25/2013   HGB 12.0 02/25/2013   HCT 36.0 02/25/2013   PLT 144.0* 02/25/2013   GLUCOSE 113* 02/25/2013   CHOL 157 02/25/2013   TRIG 140.0 02/25/2013   HDL 49.90 02/25/2013   LDLCALC 79 02/25/2013   ALT 11 02/25/2013   AST 20 02/25/2013   NA 139 02/25/2013   K 4.3 02/25/2013   CL 104 02/25/2013   CREATININE 1.5* 02/25/2013   BUN 25* 02/25/2013   CO2 27 02/25/2013   TSH 2.297 04/04/2010   INR 1.09 04/15/2010   HGBA1C 6.7* 01/30/2013    Assessment / Plan: 1. HTN - much better BP control. I have left her on her current regimen. Need to recheck her BMET today. Tentatively see her  back in 4 months.  2. CAD - no symptoms reported - will continue with medical  management  3. HLD  4. PVD - followed by [facility]  Patient is agreeable to this plan and will call if any problems develop in the interim.   Rosalio MacadamiaLori C. Baer Hinton, RN, ANP-C [facility] [address] [place], [place]  [id number]

## 2013-03-25 NOTE — Patient Instructions (Signed)
I think you are doing well  I will see you in 4 months  We will recheck your potassium/kidney function today  Call the [facility] office at [phone] if you have any questions, problems or concerns.

## 2013-04-08 ENCOUNTER — Encounter (INDEPENDENT_AMBULATORY_CARE_PROVIDER_SITE_OTHER): Payer: PRIVATE HEALTH INSURANCE | Admitting: Ophthalmology

## 2013-04-08 DIAGNOSIS — E1139 Type 2 diabetes mellitus with other diabetic ophthalmic complication: Secondary | ICD-10-CM

## 2013-04-08 DIAGNOSIS — E1165 Type 2 diabetes mellitus with hyperglycemia: Secondary | ICD-10-CM

## 2013-04-08 DIAGNOSIS — I10 Essential (primary) hypertension: Secondary | ICD-10-CM

## 2013-04-08 DIAGNOSIS — H35039 Hypertensive retinopathy, unspecified eye: Secondary | ICD-10-CM

## 2013-04-08 DIAGNOSIS — H43819 Vitreous degeneration, unspecified eye: Secondary | ICD-10-CM

## 2013-04-08 DIAGNOSIS — E11319 Type 2 diabetes mellitus with unspecified diabetic retinopathy without macular edema: Secondary | ICD-10-CM

## 2013-04-08 DIAGNOSIS — E11311 Type 2 diabetes mellitus with unspecified diabetic retinopathy with macular edema: Secondary | ICD-10-CM

## 2013-04-17 ENCOUNTER — Other Ambulatory Visit: Payer: Self-pay | Admitting: Internal Medicine

## 2013-04-29 ENCOUNTER — Telehealth: Payer: Self-pay | Admitting: Nurse Practitioner

## 2013-04-29 ENCOUNTER — Other Ambulatory Visit (INDEPENDENT_AMBULATORY_CARE_PROVIDER_SITE_OTHER): Payer: PRIVATE HEALTH INSURANCE

## 2013-04-29 DIAGNOSIS — N189 Chronic kidney disease, unspecified: Secondary | ICD-10-CM

## 2013-04-29 LAB — BASIC METABOLIC PANEL
BUN: 22 mg/dL (ref 6–23)
CO2: 26 mEq/L (ref 19–32)
Calcium: 8.5 mg/dL (ref 8.4–10.5)
Chloride: 112 mEq/L (ref 96–112)
Creatinine, Ser: 1.2 mg/dL (ref 0.4–1.2)
GFR: 46.4 mL/min — ABNORMAL LOW (ref >60.00)
Glucose, Bld: 130 mg/dL — ABNORMAL HIGH (ref 70–99)
Potassium: 4.7 mEq/L (ref 3.5–5.1)
Sodium: 143 mEq/L (ref 135–145)

## 2013-04-29 NOTE — Telephone Encounter (Signed)
Pt came in today for labs, she thought she had appt with Lawson FiscalLori also, I told her the appt with Lawson FiscalLori is scheduled for  07/22/2013.  Pt really wanted to see Lawson FiscalLori today, I called Danielle to see if Lawson FiscalLori could see pt today (Lori's schedule was full) I told her we did not have anything for today but Lawson FiscalLori could see her tomorrow 04/30/2013.  Pt said she couldn't come tomorrow so she would just wait until May. I asked her why she wanted to see Lawson FiscalLori today and she said because when she wakes up in the morning her mouth is real dry and she is seeing animals and she is trying to scoop they up.

## 2013-04-29 NOTE — Telephone Encounter (Signed)
I would ask her to see her primary care doctor. We can move her visit up with me if she would like.

## 2013-04-30 ENCOUNTER — Telehealth: Payer: Self-pay | Admitting: Nurse Practitioner

## 2013-04-30 NOTE — Telephone Encounter (Signed)
New message ° ° ° ° °Want lab results °

## 2013-05-01 ENCOUNTER — Ambulatory Visit (INDEPENDENT_AMBULATORY_CARE_PROVIDER_SITE_OTHER): Payer: PRIVATE HEALTH INSURANCE | Admitting: Nurse Practitioner

## 2013-05-01 ENCOUNTER — Ambulatory Visit: Payer: PRIVATE HEALTH INSURANCE | Admitting: Nurse Practitioner

## 2013-05-01 ENCOUNTER — Encounter: Payer: Self-pay | Admitting: Nurse Practitioner

## 2013-05-01 VITALS — BP 122/74 | HR 68 | Temp 97.8°F | Resp 10 | Wt 137.0 lb

## 2013-05-01 DIAGNOSIS — E1159 Type 2 diabetes mellitus with other circulatory complications: Secondary | ICD-10-CM

## 2013-05-01 DIAGNOSIS — F411 Generalized anxiety disorder: Secondary | ICD-10-CM

## 2013-05-01 DIAGNOSIS — J309 Allergic rhinitis, unspecified: Secondary | ICD-10-CM

## 2013-05-01 DIAGNOSIS — R443 Hallucinations, unspecified: Secondary | ICD-10-CM

## 2013-05-01 DIAGNOSIS — I10 Essential (primary) hypertension: Secondary | ICD-10-CM

## 2013-05-01 DIAGNOSIS — E1151 Type 2 diabetes mellitus with diabetic peripheral angiopathy without gangrene: Secondary | ICD-10-CM

## 2013-05-01 NOTE — Patient Instructions (Signed)
Please let us know if you have any more hallucinations; take blood sugars and blood pressure if episode occurs  FOLLOW UP In 3 month with blood work before visit; or soon if needed

## 2013-05-01 NOTE — Progress Notes (Signed)
Patient ID: [name], female   DOB: 15-Nov-1927, 80 y.o.   MRN: [id number]    Allergies  Allergen Reactions  . Penicillins Hives  . Sulfa Antibiotics Other (See Comments)    Tongue swells    Chief Complaint  Patient presents with  . Hallucinations    Patient seeing objects floating in the air x1   . Medical Managment of Chronic Issues    HPI: Patient is a 80 y.o. female seen in the office today for routine follow up; wanted to let us know she is doing very well; had episodes of high blood pressure went to cardiology and they adjusted her medications and now her blood pressure has improved; had an episode of hallucinations were she was seeing animals in the air, non threating happened once and has not reoccurred was witnessed by the man she rents a room to. otherwise doing well and without concerns  Review of Systems:  Review of Systems  Constitutional: Negative for fever, chills and malaise/fatigue.  HENT: Negative for congestion and sore throat.   Respiratory: Negative for shortness of breath.   Cardiovascular: Negative for chest pain, palpitations and leg swelling.  Gastrointestinal: Negative for diarrhea and constipation.  Genitourinary: Negative for dysuria, urgency and frequency.  Skin: Negative.   Neurological: Negative for weakness and headaches.  Endo/Heme/Allergies: Positive for environmental allergies.  Psychiatric/Behavioral: Positive for hallucinations (x1 episode). Negative for depression. The patient is not nervous/anxious.      Past Medical History  Diagnosis Date  . Coronary artery disease     Remote PCI. Had BMS to RCA and DES stent to Diagonal in Jan. 2012   . Diabetes mellitus     long standing  . Diastolic heart failure     EF 55 to 60% per cath in Jan 2012  . Hyperlipidemia   . Hypertension   . Carotid bruit   . Gout   . Obesity   . TIA (transient ischemic attack)   . Chronic kidney disease     Stage 3  . Ischemic cardiomyopathy     with  prior EF of 35%  . Anemia   . Proteinuria   . NSTEMI (non-ST elevated myocardial infarction) Jan 2012    with PCI to RCA and DX per Dr. Excell Seltzerooper  . Carotid artery occlusion    Past Surgical History  Procedure Laterality Date  . Appendectomy    . Tonsillectomy    . Left ankle fusion    . Distal rca  04/05/2010    stent  . Fracture surgery Right 2005    bilateral ankles    Social History:   reports that she quit smoking about 45 years ago. She has never used smokeless tobacco. She reports that she does not drink alcohol or use illicit drugs.  History reviewed. No pertinent family history.  Medications: Patient's Medications  New Prescriptions   No medications on file  Previous Medications   ALPRAZOLAM (XANAX) 0.25 MG TABLET    TAKE 1 TABLET BY MOUTH TWICE DAILY AS NEEDED FOR ANXIETY   AMLODIPINE (NORVASC) 5 MG TABLET    Take 1 tablet (5 mg total) by mouth daily.   ASPIRIN 81 MG TABLET    Take 81 mg by mouth daily.     BROMDAY 0.09 % SOLN    Place 1 drop into the left eye daily.   CARVEDILOL (COREG) 3.125 MG TABLET    TAKE 1 TABLET BY MOUTH TWICE DAILY FOR BLOOD PRESSURE   CETIRIZINE (ZYRTEC)  5 MG TABLET    TAKE 1 TABLET BY MOUTH ONCE DAILY AT BEDTIME   CITALOPRAM (CELEXA) 20 MG TABLET    Take 1 tablet (20 mg total) by mouth daily.   CLONIDINE (CATAPRES) 0.1 MG TABLET    TAKE 1 TABLET BY MOUTH EVERY DAY   COLESEVELAM (WELCHOL) 625 MG TABLET    Take 1 tablet (675 mg) twice daily with meals   EASY TOUCH PEN NEEDLES 32G X 6 MM MISC    USE 1 NEEDLE AT BEDTIME   FLUTICASONE (FLONASE) 50 MCG/ACT NASAL SPRAY    USE 2 SPRAYS IN EACH NOSTRIL DAILY   FREESTYLE LITE TEST STRIP    CHECK BLOOD SUGAR DAILY   LISINOPRIL (PRINIVIL,ZESTRIL) 10 MG TABLET    Take 1 tablet (10 mg total) by mouth 2 (two) times daily.   LOTEMAX 0.5 % OPHTHALMIC SUSPENSION    Place 1 drop into the left eye 3 (three) times daily.    LOVAZA 1 G CAPSULE    TAKE 2 CAPSULES BY MOUTH EVERY NIGHT AT BEDTIME FOR 30 DAYS    NITROGLYCERIN (NITROSTAT) 0.4 MG SL TABLET    Place 1 tablet (0.4 mg total) under the tongue every 5 (five) minutes as needed.   POLYETHYLENE GLYCOL (MIRALAX / GLYCOLAX) PACKET    Take 17 g by mouth daily.   PRODIGY TWIST TOP LANCETS 28G MISC    CHECK BLOOD SUGAR DAILY   VICTOZA 18 MG/3ML SOPN    INJECT 1.2MG  SUBCUTANEOUSLY EVERY DAY   VYTORIN 10-20 MG PER TABLET    TAKE 1/2 TABLET BY MOUTH EVERY NIGHT AT BEDTIME FOR 30 DAYS  Modified Medications   No medications on file  Discontinued Medications   No medications on file     Physical Exam:  Filed Vitals:   05/01/13 0945  BP: 122/74  Pulse: 68  Temp: 97.8 F (36.6 C)  TempSrc: Oral  Resp: 10  Weight: 137 lb (62.143 kg)  SpO2: 95%    Physical Exam  Constitutional: She is oriented to person, place, and time and well-developed, well-nourished, and in no distress. No distress.  HENT:  Head: Normocephalic and atraumatic.  Eyes: Conjunctivae and EOM are normal. Pupils are equal, round, and reactive to light.  Neck: Normal range of motion. Neck supple. No thyromegaly present.  Cardiovascular: Normal rate, regular rhythm and normal heart sounds.   Pulmonary/Chest: Effort normal and breath sounds normal. No respiratory distress.  Abdominal: Soft. Bowel sounds are normal. She exhibits no distension.  Musculoskeletal: She exhibits no edema.  Neurological: She is alert and oriented to person, place, and time.  Skin: Skin is warm and dry. She is not diaphoretic.  Psychiatric: Affect normal.     Labs reviewed: Basic Metabolic Panel:  Recent Labs  45/40/98 1206 03/25/13 0919 04/29/13 0959  NA 139 143 143  K 4.3 4.5 4.7  CL 104 109 112  CO2 27 26 26   GLUCOSE 113* 143* 130*  BUN 25* 29* 22  CREATININE 1.5* 1.3* 1.2  CALCIUM 8.9 8.7 8.5   Liver Function Tests:  Recent Labs  01/30/13 1215 02/25/13 1206  AST 16 20  ALT 8 11  ALKPHOS 57 46  BILITOT 0.4 0.6  PROT 5.6* 6.2  ALBUMIN  --  3.6   No results found for this  basename: LIPASE, AMYLASE,  in the last 8760 hours No results found for this basename: AMMONIA,  in the last 8760 hours CBC:  Recent Labs  02/25/13 1206  WBC 4.5  NEUTROABS 2.0  HGB 12.0  HCT 36.0  MCV 91.1  PLT 144.0*   Lipid Panel:  Recent Labs  01/30/13 1215 02/25/13 1206  CHOL  --  157  HDL 58 49.90  LDLCALC 52 79  TRIG 80 140.0  CHOLHDL 2.2 3   Lab Results  Component Value Date   HGBA1C 6.7* 01/30/2013     Assessment/Plan 1. Hypertension -following with cardiology, recent adjustments to medications which have improved blood pressure - Comprehensive metabolic panel; Future  2. DM (diabetes mellitus), type 2 with peripheral vascular complications -A1c at goal for pts age in November; no hypoglycemic episodes per pt, conts on medications without problems  - Hemoglobin A1c; Future  3. Hallucination - 1 episode 3 weeks ago; lasted a few minutes and has not happened again -will cont to monitor, if happens again to take blood sugar and blood pressure and notify office  4. Allergic rhinitis -stable, conts on zyrtec and Flonase that causes dry mouth, otherwise tolerates medication well  5. Anxiety state, unspecified -improved on celexa, will cont medication, does not report side effects  -will not refill xanax (due to side effects in the elderly) since she has done well on celexa   To follow up in 3 months with blood work before visit

## 2013-05-15 ENCOUNTER — Other Ambulatory Visit: Payer: Self-pay | Admitting: Internal Medicine

## 2013-05-15 NOTE — Telephone Encounter (Signed)
Next appt isn't until 07/2013.  Is it okay to refill her Clonidine 0.1 mg?

## 2013-05-20 ENCOUNTER — Encounter (INDEPENDENT_AMBULATORY_CARE_PROVIDER_SITE_OTHER): Payer: PRIVATE HEALTH INSURANCE | Admitting: Ophthalmology

## 2013-05-22 ENCOUNTER — Ambulatory Visit: Payer: PRIVATE HEALTH INSURANCE | Admitting: Internal Medicine

## 2013-06-03 ENCOUNTER — Encounter (INDEPENDENT_AMBULATORY_CARE_PROVIDER_SITE_OTHER): Payer: PRIVATE HEALTH INSURANCE | Admitting: Ophthalmology

## 2013-06-03 DIAGNOSIS — E1165 Type 2 diabetes mellitus with hyperglycemia: Secondary | ICD-10-CM

## 2013-06-03 DIAGNOSIS — H43819 Vitreous degeneration, unspecified eye: Secondary | ICD-10-CM

## 2013-06-03 DIAGNOSIS — H35039 Hypertensive retinopathy, unspecified eye: Secondary | ICD-10-CM

## 2013-06-03 DIAGNOSIS — I10 Essential (primary) hypertension: Secondary | ICD-10-CM

## 2013-06-03 DIAGNOSIS — E11311 Type 2 diabetes mellitus with unspecified diabetic retinopathy with macular edema: Secondary | ICD-10-CM

## 2013-06-03 DIAGNOSIS — E1139 Type 2 diabetes mellitus with other diabetic ophthalmic complication: Secondary | ICD-10-CM

## 2013-06-03 DIAGNOSIS — E11319 Type 2 diabetes mellitus with unspecified diabetic retinopathy without macular edema: Secondary | ICD-10-CM

## 2013-06-04 ENCOUNTER — Encounter: Payer: Self-pay | Admitting: Nurse Practitioner

## 2013-06-12 ENCOUNTER — Other Ambulatory Visit: Payer: Self-pay | Admitting: *Deleted

## 2013-06-12 NOTE — Telephone Encounter (Signed)
Dose change in Victozia.  Need to repeat lab work.

## 2013-06-17 NOTE — Telephone Encounter (Signed)
Pt wanted to wait until May to see Lawson FiscalLori, she was advised to see her PCP when she was in the office on 04/29/2013

## 2013-07-03 ENCOUNTER — Encounter: Payer: Self-pay | Admitting: Family

## 2013-07-04 ENCOUNTER — Ambulatory Visit (INDEPENDENT_AMBULATORY_CARE_PROVIDER_SITE_OTHER): Payer: PRIVATE HEALTH INSURANCE | Admitting: Family

## 2013-07-04 ENCOUNTER — Ambulatory Visit (HOSPITAL_COMMUNITY)
Admission: RE | Admit: 2013-07-04 | Discharge: 2013-07-04 | Disposition: A | Discharge status: Home / Self Care | Payer: PRIVATE HEALTH INSURANCE | Source: Ambulatory Visit | Attending: Family | Admitting: Family

## 2013-07-04 ENCOUNTER — Ambulatory Visit (INDEPENDENT_AMBULATORY_CARE_PROVIDER_SITE_OTHER)
Admission: RE | Admit: 2013-07-04 | Discharge: 2013-07-04 | Disposition: A | Discharge status: Home / Self Care | Payer: PRIVATE HEALTH INSURANCE | Source: Ambulatory Visit | Attending: Family | Admitting: Family

## 2013-07-04 ENCOUNTER — Encounter: Payer: Self-pay | Admitting: Family

## 2013-07-04 VITALS — BP 141/58 | HR 53 | Resp 14 | Ht 60.0 in | Wt 137.0 lb

## 2013-07-04 DIAGNOSIS — M79609 Pain in unspecified limb: Secondary | ICD-10-CM

## 2013-07-04 DIAGNOSIS — I739 Peripheral vascular disease, unspecified: Secondary | ICD-10-CM

## 2013-07-04 DIAGNOSIS — I70219 Atherosclerosis of native arteries of extremities with intermittent claudication, unspecified extremity: Secondary | ICD-10-CM | POA: Insufficient documentation

## 2013-07-04 NOTE — Patient Instructions (Signed)
Peripheral Vascular Disease Peripheral Vascular Disease (PVD), also called Peripheral Arterial Disease (PAD), is a circulation problem caused by cholesterol (atherosclerotic plaque) deposits in the arteries. PVD commonly occurs in the lower extremities (legs) but it can occur in other areas of the body, such as your arms. The cholesterol buildup in the arteries reduces blood flow which can cause pain and other serious problems. The presence of PVD can place a person at risk for Coronary Artery Disease (CAD).  CAUSES  Causes of PVD can be many. It is usually associated with more than one risk factor such as:   High Cholesterol.  Smoking.  Diabetes.  Lack of exercise or inactivity.  High blood pressure (hypertension).  Obesity.  Family history. SYMPTOMS   When the lower extremities are affected, patients with PVD may experience:  Leg pain with exertion or physical activity. This is called INTERMITTENT CLAUDICATION. This may present as cramping or numbness with physical activity. The location of the pain is associated with the level of blockage. For example, blockage at the abdominal level (distal abdominal aorta) may result in buttock or hip pain. Lower leg arterial blockage may result in calf pain.  As PVD becomes more severe, pain can develop with less physical activity.  In people with severe PVD, leg pain may occur at rest.  Other PVD signs and symptoms:  Leg numbness or weakness.  Coldness in the affected leg or foot, especially when compared to the other leg.  A change in leg color.  Patients with significant PVD are more prone to ulcers or sores on toes, feet or legs. These may take longer to heal or may reoccur. The ulcers or sores can become infected.  If signs and symptoms of PVD are ignored, gangrene may occur. This can result in the loss of toes or loss of an entire limb.  Not all leg pain is related to PVD. Other medical conditions can cause leg pain such  as:  Blood clots (embolism) or Deep Vein Thrombosis.  Inflammation of the blood vessels (vasculitis).  Spinal stenosis. DIAGNOSIS  Diagnosis of PVD can involve several different types of tests. These can include:  Pulse Volume Recording Method (PVR). This test is simple, painless and does not involve the use of X-rays. PVR involves measuring and comparing the blood pressure in the arms and legs. An ABI (Ankle-Brachial Index) is calculated. The normal ratio of blood pressures is 1. As this number becomes smaller, it indicates more severe disease.  < 0.95  indicates significant narrowing in one or more leg vessels.  <0.8 there will usually be pain in the foot, leg or buttock with exercise.  <0.4 will usually have pain in the legs at rest.  <0.25  usually indicates limb threatening PVD.  Doppler detection of pulses in the legs. This test is painless and checks to see if you have a pulses in your legs/feet.  A dye or contrast material (a substance that highlights the blood vessels so they show up on x-ray) may be given to help your caregiver better see the arteries for the following tests. The dye is eliminated from your body by the kidney's. Your caregiver may order blood work to check your kidney function and other laboratory values before the following tests are performed:  Magnetic Resonance Angiography (MRA). An MRA is a picture study of the blood vessels and arteries. The MRA machine uses a large magnet to produce images of the blood vessels.  Computed Tomography Angiography (CTA). A CTA is a   specialized x-ray that looks at how the blood flows in your blood vessels. An IV may be inserted into your arm so contrast dye can be injected.  Angiogram. Is a procedure that uses x-rays to look at your blood vessels. This procedure is minimally invasive, meaning a small incision (cut) is made in your groin. A small tube (catheter) is then inserted into the artery of your groin. The catheter is  guided to the blood vessel or artery your caregiver wants to examine. Contrast dye is injected into the catheter. X-rays are then taken of the blood vessel or artery. After the images are obtained, the catheter is taken out. TREATMENT  Treatment of PVD involves many interventions which may include:  Lifestyle changes:  Quitting smoking.  Exercise.  Following a low fat, low cholesterol diet.  Control of diabetes.  Foot care is very important to the PVD patient. Good foot care can help prevent infection.  Medication:  Cholesterol-lowering medicine.  Blood pressure medicine.  Anti-platelet drugs.  Certain medicines may reduce symptoms of Intermittent Claudication.  Interventional/Surgical options:  Angioplasty. An Angioplasty is a procedure that inflates a balloon in the blocked artery. This opens the blocked artery to improve blood flow.  Stent Implant. A wire mesh tube (stent) is placed in the artery. The stent expands and stays in place, allowing the artery to remain open.  Peripheral Bypass Surgery. This is a surgical procedure that reroutes the blood around a blocked artery to help improve blood flow. This type of procedure may be performed if Angioplasty or stent implants are not an option. SEEK IMMEDIATE MEDICAL CARE IF:   You develop pain or numbness in your arms or legs.  Your arm or leg turns cold, becomes blue in color.  You develop redness, warmth, swelling and pain in your arms or legs. MAKE SURE YOU:   Understand these instructions.  Will watch your condition.  Will get help right away if you are not doing well or get worse. Document Released: 04/14/2004 Document Revised: 05/30/2011 Document Reviewed: 03/11/2008 [employer] Patient Information 2014 [employer].  

## 2013-07-04 NOTE — Progress Notes (Signed)
[facility] [place] HISTORY AND PHYSICAL -PAD  History of Present Illness [name] is a 80 y.o. female patient that Dr. Darrick PennaFields has been following for bilateral lower extremity peripheral arterial disease. When Dr. Darrick PennaFields saw her 6 months ago her ABI's were fairly consistent and essentially unchanged since 2011. She has multiple comorbidities. When seen recently she did not have limb threatening ischemia. If she develops rest pain in her right foot for nonhealing wound an arteriogram would be considered. However in light of her renal dysfunction Dr. Darrick PennaFields did not believe the risk of contrast exposure outweighs the benefits at this point. She was to followup in 6 months time for repeat ABIs. She was advised to followup sooner if she develops any the above symptoms.   She was seen at the wound center for a non-healing wound which finally healed, last treated there about 2 years ago. Pt has not had previous peripheral vascular  intervention.  She has a small dried scab wound on lateral aspect of right foot; daughter states that this has not changed in the last week.  Patient denies New Medical or Surgical History.   Pt Diabetic: Yes, now diet controlled, meds stopped 2 months ago by her PCP Pt smoker: former smoker, quit 1970  Pt meds include:  Statin :Yes  Betablocker: Yes  ASA: Yes  Other anticoagulants/antiplatelets: no  She has resting pain originating in lower right ankle at times, radiates upward to right groin, then across to left leg and radiates down left inner thigh.  Past Medical History  Diagnosis Date  . Coronary artery disease     Remote PCI. Had BMS to RCA and DES stent to Diagonal in Jan. 2012   . Diabetes mellitus     long standing  . Diastolic heart failure     EF 55 to 60% per cath in Jan 2012  . Hyperlipidemia   . Hypertension   . Carotid bruit   . Gout   . Obesity   . TIA (transient ischemic attack)   . Chronic kidney disease     Stage  3  . Ischemic cardiomyopathy     with prior EF of 35%  . Anemia   . Proteinuria   . NSTEMI (non-ST elevated myocardial infarction) Jan 2012    with PCI to RCA and DX per Dr. Excell Seltzerooper  . Carotid artery occlusion   . Stroke 2013    ?  mini    . DVT (deep venous thrombosis)     Social History History  Substance Use Topics  . Smoking status: Former Smoker    Quit date: 03/21/1968  . Smokeless tobacco: Never Used  . Alcohol Use: No    Family History Family History  Problem Relation Age of Onset  . Family history unknown: Yes    Past Surgical History  Procedure Laterality Date  . Appendectomy    . Tonsillectomy    . Left ankle fusion    . Distal rca  04/05/2010    stent  . Fracture surgery Right 2005    bilateral ankles     Allergies  Allergen Reactions  . Penicillins Hives  . Sulfa Antibiotics Other (See Comments)    Tongue swells    Current Outpatient Prescriptions  Medication Sig Dispense Refill  . amLODipine (NORVASC) 5 MG tablet Take 1 tablet (5 mg total) by mouth daily.  30 tablet  9  . aspirin 81 MG tablet Take 81 mg by mouth daily.        .Marland Kitchen  BROMDAY 0.09 % SOLN Place 1 drop into the left eye daily.      . carvedilol (COREG) 3.125 MG tablet TAKE 1 TABLET BY MOUTH TWICE DAILY FOR BLOOD PRESSURE  60 tablet  5  . cetirizine (ZYRTEC) 5 MG tablet TAKE 1 TABLET BY MOUTH ONCE DAILY AT BEDTIME  30 tablet  5  . citalopram (CELEXA) 20 MG tablet Take 1 tablet (20 mg total) by mouth daily.  30 tablet  3  . cloNIDine (CATAPRES) 0.1 MG tablet TAKE 1 TABLET BY MOUTH EVERY DAY  30 tablet  3  . colesevelam (WELCHOL) 625 MG tablet Take 1 tablet (675 mg) twice daily with meals  60 tablet  2  . EASY TOUCH PEN NEEDLES 32G X 6 MM MISC USE 1 NEEDLE AT BEDTIME  100 each  1  . fluticasone (FLONASE) 50 MCG/ACT nasal spray USE 2 SPRAYS IN EACH NOSTRIL DAILY  16 g  3  . FREESTYLE LITE test strip CHECK BLOOD SUGAR DAILY  50 each  1  . lisinopril (PRINIVIL,ZESTRIL) 10 MG tablet Take 1  tablet (10 mg total) by mouth 2 (two) times daily.  60 tablet  5  . LOTEMAX 0.5 % ophthalmic suspension Place 1 drop into the left eye 3 (three) times daily.       Marland Kitchen LOVAZA 1 G capsule TAKE 2 CAPSULES BY MOUTH EVERY NIGHT AT BEDTIME FOR 30 DAYS  60 capsule  3  . nitroGLYCERIN (NITROSTAT) 0.4 MG SL tablet Place 1 tablet (0.4 mg total) under the tongue every 5 (five) minutes as needed.  25 tablet  11  . polyethylene glycol (MIRALAX / GLYCOLAX) packet Take 17 g by mouth daily.      Marland Kitchen PRODIGY TWIST TOP LANCETS 28G MISC CHECK BLOOD SUGAR DAILY  100 each  1  . trimethoprim-polymyxin b (POLYTRIM) ophthalmic solution       . VYTORIN 10-20 MG per tablet TAKE 1/2 TABLET BY MOUTH EVERY NIGHT AT BEDTIME FOR 30 DAYS  15 tablet  3  . Liraglutide (VICTOZA) 18 MG/3ML SOPN INJECT 0.6 MG SUBCUTANEOUSLY EVERY DAY       No current facility-administered medications for this visit.    ROS: See HPI for pertinent positives and negatives.   Physical Examination  Filed Vitals:   07/04/13 1445  BP: 141/58  Pulse: 53  Resp: 14   Filed Weights   07/04/13 1445  Weight: 137 lb (62.143 kg)   Body mass index is 26.76 kg/(m^2).  General: A&O x 3, WDWN,  Gait: slow, deliberate  Eyes: PERRLA,  Pulmonary: CTAB, without wheezes , rales or rhonchi  Cardiac: regular Rythm , with mild murmur  Carotid Bruits  Left  Right    Positive  Positive   Aorta: not palpable  Radial pulses: 2+ and equal  VASCULAR EXAM:  Extremities without ischemic changes  without Gangrene; without open wounds.  LE Pulses  LEFT  RIGHT   FEMORAL   palpable  palpable   POPLITEAL  not palpable  not palpable   POSTERIOR TIBIAL  not palpable  not palpable   DORSALIS PEDIS  ANTERIOR TIBIAL  palpable  palpable   Abdomen: soft, NT, no masses.  Skin: no rashes, no ulcers noted.  Musculoskeletal: no muscle wasting or atrophy.  Neurologic: A&O X 3; Appropriate Affect ; SENSATION: normal; MOTOR FUNCTION: moving all extremities equally, motor  strength 5/5 in upper extremities, 3/5 in lower extremities. Speech is fluent/normal. CN 2-12 intact except for hard of hearing.  Non-Invasive Vascular Imaging: DATE: 07/04/2013 LOWER EXTREMITY ARTERIAL DUPLEX EVALUATION    INDICATION: Peripheral Vascular Disease     PREVIOUS INTERVENTION(S): N/A    DUPLEX EXAM:     RIGHT  LEFT   Peak Systolic Velocity (cm/s) Ratio (if abnormal) Waveform  Peak Systolic Velocity (cm/s) Ratio (if abnormal) Waveform  158  M Common Femoral Artery 258  B  215  M Deep Femoral Artery 170  B  73/103  M/M Superficial Femoral Artery Proximal 90/106/310 2.9 M/M/M  115/441 3.8 M/M Superficial Femoral Artery Mid 88/276 3.1 M/M  128/0  Occluded Superficial Femoral Artery Distal 128  M  129/105  M/M Popliteal Artery 64/75  M/M  0  Occluded Posterior Tibial Artery Dist 31  M  52  M Anterior Tibial Artery Distal 75  M  44  M Peroneal Artery Distal 45  M  0.34/0.20 Today's ABI / TBI 0.54/0.34  0.34/0.20 Previous ABI / TBI (12/27/2012 ) 0.64/0.28    Waveform:    M - Monophasic       B - Biphasic       T - Triphasic  If Ankle Brachial Index (ABI) or Toe Brachial Index (TBI) performed, please see complete report     ADDITIONAL FINDINGS:     IMPRESSION: Elevated velocities present with heterogeneous plaque suggestive of greater than 50% stenosis involving the right mid superficial femoral artery and the left proximal and mid superficial femoral artery. No Color or spectral Doppler waveform could be obtained involving the right distal superficial femoral artery and right posterior tibial artery suggestive of vessel occlusion.    Compared to the previous exam:  Stable right ankle brachial index and decreased left ankle brachial index since previous study on 12/27/2012.    ASSESSMENT: [name] is a 80 y.o. female with PAD who presents with stable right ankle brachial index and decreased left ankle brachial index since previous study on  12/27/2012. Elevated velocities present with heterogeneous plaque suggestive of greater than 50% stenosis involving the right mid superficial femoral artery and the left proximal and mid superficial femoral artery. No Color or spectral Doppler waveform could be obtained involving the right distal superficial femoral artery and right posterior tibial artery suggestive of vessel occlusion. The occasional pain originating in her right ankle that ascends to her right groin and crosses over to her left groin may be rest pain. She has a very small dried wound, no s/s's of infection, about 3 mm in length, on the lateral aspect of her right foot, see Plan. See Plan.  PLAN:  I discussed in depth with the patient the nature of atherosclerosis, and emphasized the importance of maximal medical management including strict control of blood pressure, blood glucose, and lipid levels, obtaining regular exercise, and continued cessation of smoking.  The patient is aware that without maximal medical management the underlying atherosclerotic disease process will progress, limiting the benefit of any interventions.  Based on the patient's vascular studies and examination, and after discussing with Dr. Darrick Penna, pt will return to clinic in 3 months for bilateral LE arterial Duplex and ABI's.  If the small dry wound on the lateral aspect of her right foot has not resolved in a week, see Dr. Darrick Penna.  The patient was given information about PAD including signs, symptoms, treatment, what symptoms should prompt the patient to seek immediate medical care, and risk reduction measures to take.  Charisse March, RN, MSN, FNP-C [facility] [place] Phone: [phone]  [facility]  07/04/2013 2:54 PM

## 2013-07-08 ENCOUNTER — Other Ambulatory Visit: Payer: Self-pay | Admitting: *Deleted

## 2013-07-08 MED ORDER — COLESEVELAM HCL 625 MG PO TABS: ORAL_TABLET | ORAL | Status: DC

## 2013-07-08 MED ORDER — CARVEDILOL 3.125 MG PO TABS: ORAL_TABLET | ORAL | Status: DC

## 2013-07-08 NOTE — Addendum Note (Signed)
Addended by: Adria DillELDRIDGE-LEWIS, Kalei Mckillop L on: 07/08/2013 02:03 PM   Modules accepted: Orders

## 2013-07-08 NOTE — Telephone Encounter (Signed)
[facility] 

## 2013-07-10 ENCOUNTER — Encounter: Payer: Self-pay | Admitting: Vascular Surgery

## 2013-07-11 ENCOUNTER — Ambulatory Visit (INDEPENDENT_AMBULATORY_CARE_PROVIDER_SITE_OTHER): Payer: PRIVATE HEALTH INSURANCE | Admitting: Vascular Surgery

## 2013-07-11 ENCOUNTER — Encounter: Payer: Self-pay | Admitting: Vascular Surgery

## 2013-07-11 VITALS — BP 148/45 | HR 53 | Ht 60.0 in | Wt 137.0 lb

## 2013-07-11 DIAGNOSIS — I70219 Atherosclerosis of native arteries of extremities with intermittent claudication, unspecified extremity: Secondary | ICD-10-CM

## 2013-07-11 NOTE — Progress Notes (Signed)
[facility] [place] HISTORY AND PHYSICAL    History of Present Illness:  Patient is a 80 y.o. year old female who presents for ulceration on the right foot.  She also has pain in the right ankle region. She was last seen in April 2014.  She denies any classic claudication symptoms. According to her she can walk all day long. She was previously seen by my partner Dr. Arbie Cookey in 2007 for a nonhealing ulcer on the right leg. This has now spontaneously healed. I reviewed her ABIs from 2011 which were 0.56 bilaterally. She had monophasic tibial waveforms at that time..  Other medical problems include  CKD 3, coronary artery disease status post multiple stents, diabetes, diastolic heart failure, hyperlipidemia, hypertension, carotid bruit, gout, remote TIA, prior myocardial infarction. These problems are all currently stable.  She did have what sounds like fairly severe bilateral active: Fractures several years ago. She states that the right leg has not really been perfect since then and frequently has restless symptoms.  She has been having increased leg swelling recently. She recently noticed an ulceration on the right foot which she thinks started as a crack in her skin.    Past Medical History    Diagnosis   Date    .   Coronary artery disease             Remote PCI. Had BMS to RCA and DES stent to Diagonal in Jan. 2012     .   Diabetes mellitus             long standing    .   Diastolic heart failure             EF 55 to 60% per cath in Jan 2012    .   Hyperlipidemia       .   Hypertension       .   Carotid bruit       .   Gout       .   Obesity       .   TIA (transient ischemic attack)       .   Chronic kidney disease             Stage 3    .   Ischemic cardiomyopathy             with prior EF of 35%    .   NSTEMI (non-ST elevated myocardial infarction)   Jan 2012          with PCI to RCA and DX per Dr. Excell Seltzer    .   Anemia       .   Proteinuria           Past  Surgical History    Procedure   Date    .   Appendectomy       .   Tonsillectomy       .   Left ankle fusion       .   Distal rca   04/05/2010          stent       Social History History    Substance Use Topics    .   Smoking status:   Former Smoker          Quit date:   03/21/1968    .   Smokeless tobacco:   Never Used    .  Alcohol Use:   No       Current Outpatient Prescriptions on File Prior to Visit  Medication Sig Dispense Refill  . amLODipine (NORVASC) 5 MG tablet Take 1 tablet (5 mg total) by mouth daily.  30 tablet  9  . aspirin 81 MG tablet Take 81 mg by mouth daily.        Marland Kitchen. BROMDAY 0.09 % SOLN Place 1 drop into the left eye daily.      . carvedilol (COREG) 3.125 MG tablet Take one tablet by mouth twice daily for blood pressure  60 tablet  5  . cetirizine (ZYRTEC) 5 MG tablet TAKE 1 TABLET BY MOUTH ONCE DAILY AT BEDTIME  30 tablet  5  . citalopram (CELEXA) 20 MG tablet Take 1 tablet (20 mg total) by mouth daily.  30 tablet  3  . cloNIDine (CATAPRES) 0.1 MG tablet TAKE 1 TABLET BY MOUTH EVERY DAY  30 tablet  3  . colesevelam (WELCHOL) 625 MG tablet Take 1 tablet (675 mg) twice daily with meals  60 tablet  5  . EASY TOUCH PEN NEEDLES 32G X 6 MM MISC USE 1 NEEDLE AT BEDTIME  100 each  1  . fluticasone (FLONASE) 50 MCG/ACT nasal spray USE 2 SPRAYS IN EACH NOSTRIL DAILY  16 g  3  . FREESTYLE LITE test strip CHECK BLOOD SUGAR DAILY  50 each  1  . Liraglutide (VICTOZA) 18 MG/3ML SOPN INJECT 0.6 MG SUBCUTANEOUSLY EVERY DAY      . lisinopril (PRINIVIL,ZESTRIL) 10 MG tablet Take 1 tablet (10 mg total) by mouth 2 (two) times daily.  60 tablet  5  . LOTEMAX 0.5 % ophthalmic suspension Place 1 drop into the left eye 3 (three) times daily.       Marland Kitchen. LOVAZA 1 G capsule TAKE 2 CAPSULES BY MOUTH EVERY NIGHT AT BEDTIME FOR 30 DAYS  60 capsule  3  . nitroGLYCERIN (NITROSTAT) 0.4 MG SL tablet Place 1 tablet (0.4 mg total) under the tongue every 5 (five) minutes as needed.  25 tablet  11  .  polyethylene glycol (MIRALAX / GLYCOLAX) packet Take 17 g by mouth daily.      Marland Kitchen. PRODIGY TWIST TOP LANCETS 28G MISC CHECK BLOOD SUGAR DAILY  100 each  1  . trimethoprim-polymyxin b (POLYTRIM) ophthalmic solution       . VYTORIN 10-20 MG per tablet TAKE 1/2 TABLET BY MOUTH EVERY NIGHT AT BEDTIME FOR 30 DAYS  15 tablet  3   No current facility-administered medications on file prior to visit.    Allergies    Allergies    Allergen   Reactions    .   Penicillins       .   Sulfa Antibiotics        Review of systems: She denies chest pain. She denies shortness of breath.  Physical Examination  Filed Vitals:   07/11/13 1503  BP: 148/45  Pulse: 53  Height: 5' (1.524 m)  Weight: 137 lb (62.143 kg)  SpO2: 98%     General:  Alert and oriented, no acute distress HEENT: Normal Skin: No rash, she has thickened skin in the calf region just above the medial malleolus bilaterally. This is tender to palpation. She has dry scaly skin on her feet bilaterally with some loss of hair,  trace pretibial edema and pedal edema bilaterally, there is an ulceration on the right lateral foot 3 x 2 mm in diameter less than 1 mm in depth  Extremity Pulses:  2+ radial, brachial, femoral, absent dorsalis pedis, posterior tibial pulses bilaterally Musculoskeletal: Some shortening of the right leg compared to the left from previous ankle fracture   Neurologic: Upper and lower extremity motor 5/5 and symmetric  DATA: I reviewed her ABIs from 2011 as well as her AB[facility] from [facility]. Her most recent ABI was 0.4 on the right 0.6 on the left this is compared to 0.56 bilaterally in 2011.  she also had a carotid duplex scan today which are reviewed and interpreted. This shows a less than 40% carotid stenosis bilaterally with calcified vessels. Her ABIs today were also reviewed and interpreted. ABI on the right 0. 34 left 0.54  ASSESSMENT: Bilateral lower extremity peripheral  arterial disease.  I believe these numbers probably are fairly consistent and really unchanged since 2011. She has multiple comorbidities. She currently does not have limb threatening ischemia. If she develops rest pain in her right foot or nonhealing wound we would consider an arteriogram. However in light of her renal dysfunction I do not believe the risk of contrast exposure outweighs the benefits at this point. She will followup with us in 12 months time for repeat ABIs. She will followup sooner if she develops any the above symptoms. They'll return sooner if the wound in her right foot is not healed spontaneously  PLAN:  See above  Fabienne Brunsharles Fields, MD [facility] [place] Office: [phone] Pager: [phone]

## 2013-07-15 ENCOUNTER — Encounter (INDEPENDENT_AMBULATORY_CARE_PROVIDER_SITE_OTHER): Payer: PRIVATE HEALTH INSURANCE | Admitting: Ophthalmology

## 2013-07-15 DIAGNOSIS — E1165 Type 2 diabetes mellitus with hyperglycemia: Secondary | ICD-10-CM

## 2013-07-15 DIAGNOSIS — E1139 Type 2 diabetes mellitus with other diabetic ophthalmic complication: Secondary | ICD-10-CM

## 2013-07-15 DIAGNOSIS — H35039 Hypertensive retinopathy, unspecified eye: Secondary | ICD-10-CM

## 2013-07-15 DIAGNOSIS — I10 Essential (primary) hypertension: Secondary | ICD-10-CM

## 2013-07-15 DIAGNOSIS — H43819 Vitreous degeneration, unspecified eye: Secondary | ICD-10-CM

## 2013-07-15 DIAGNOSIS — E11319 Type 2 diabetes mellitus with unspecified diabetic retinopathy without macular edema: Secondary | ICD-10-CM

## 2013-07-15 DIAGNOSIS — E11311 Type 2 diabetes mellitus with unspecified diabetic retinopathy with macular edema: Secondary | ICD-10-CM

## 2013-07-22 ENCOUNTER — Encounter: Payer: Self-pay | Admitting: Nurse Practitioner

## 2013-07-22 ENCOUNTER — Ambulatory Visit (INDEPENDENT_AMBULATORY_CARE_PROVIDER_SITE_OTHER): Payer: PRIVATE HEALTH INSURANCE | Admitting: Nurse Practitioner

## 2013-07-22 ENCOUNTER — Ambulatory Visit: Payer: PRIVATE HEALTH INSURANCE | Admitting: Nurse Practitioner

## 2013-07-22 VITALS — BP 130/60 | HR 64 | Ht 61.0 in | Wt 134.1 lb

## 2013-07-22 DIAGNOSIS — I2510 Atherosclerotic heart disease of native coronary artery without angina pectoris: Secondary | ICD-10-CM

## 2013-07-22 DIAGNOSIS — I10 Essential (primary) hypertension: Secondary | ICD-10-CM

## 2013-07-22 NOTE — Patient Instructions (Addendum)
Stay on your current medicines  I will see you in 6 months  Call the [facility] office at [phone] if you have any questions, problems or concerns.   

## 2013-07-22 NOTE — Progress Notes (Signed)
[name] Date of Birth: 1927-03-23 Medical Record #[id number]  History of Present Illness: [name] is seen back today for a follow up visit. It is a 4 month check. Seen for Dr. Excell Seltzer. She is a former patient of Dr. Ronnald Nian. She has multiple issues which include CAD, remote PCI, BMS to the RCA and DES to the DX in January of 2012. She has longstanding diabetes, HTN, HLD, diastolic heart failure with EF 55 to 60% per cath back in 2012 but has been as low as 35%, PVD (seeing Dr. Darrick Penna), gout, CKD and chronic anemia.   Seen back in July of 2014 by me and seemed to be doing ok. Seen back in November of 2014 by PCP for some ongoing issues with anxiety/BP. Has had lisinopril added back along with some Xanax/Celexa. Having personal issues with a live in man that she does not want to evict as well as family issues in general. She has always cared for many animals in and at her home.   I saw her back in January of this year. Medicines had been adjusted for her BP. Still with lots of social issues with her home situation.   Comes in today. Here alone today. Has seen Dr. Darrick Penna recently at [facility]. Has a foot ulcer. He feels her ABIs are unchanged. She is managed conservatively in light of her multiple comorbidities. No chest pain. Not short of breath. Seems more limited by her pain in her lower legs, especially on the right. Hard to walk. Moving slow. No falls. Does complain of dry mouth - probably from the clonidine.   Current Outpatient Prescriptions  Medication Sig Dispense Refill  . amLODipine (NORVASC) 5 MG tablet Take 1 tablet (5 mg total) by mouth daily.  30 tablet  9  . aspirin 81 MG tablet Take 81 mg by mouth daily.        Marland Kitchen BROMDAY 0.09 % SOLN Place 1 drop into the left eye daily.      . carvedilol (COREG) 3.125 MG tablet Take one tablet by mouth twice daily for blood pressure  60 tablet  5  . cetirizine (ZYRTEC) 5 MG tablet TAKE 1 TABLET BY MOUTH ONCE DAILY AT BEDTIME  30 tablet  5  .  citalopram (CELEXA) 20 MG tablet Take 1 tablet (20 mg total) by mouth daily.  30 tablet  3  . cloNIDine (CATAPRES) 0.1 MG tablet TAKE 1 TABLET BY MOUTH EVERY DAY  30 tablet  3  . colesevelam (WELCHOL) 625 MG tablet Take 1 tablet (675 mg) twice daily with meals  60 tablet  5  . EASY TOUCH PEN NEEDLES 32G X 6 MM MISC USE 1 NEEDLE AT BEDTIME  100 each  1  . fluticasone (FLONASE) 50 MCG/ACT nasal spray USE 2 SPRAYS IN EACH NOSTRIL DAILY  16 g  3  . FREESTYLE LITE test strip CHECK BLOOD SUGAR DAILY  50 each  1  . Liraglutide (VICTOZA) 18 MG/3ML SOPN INJECT 0.6 MG SUBCUTANEOUSLY EVERY DAY      . lisinopril (PRINIVIL,ZESTRIL) 10 MG tablet Take 1 tablet (10 mg total) by mouth 2 (two) times daily.  60 tablet  5  . LOTEMAX 0.5 % ophthalmic suspension Place 1 drop into the left eye 3 (three) times daily.       Marland Kitchen LOVAZA 1 G capsule TAKE 2 CAPSULES BY MOUTH EVERY NIGHT AT BEDTIME FOR 30 DAYS  60 capsule  3  . nitroGLYCERIN (NITROSTAT) 0.4 MG SL tablet Place  1 tablet (0.4 mg total) under the tongue every 5 (five) minutes as needed.  25 tablet  11  . polyethylene glycol (MIRALAX / GLYCOLAX) packet Take 17 g by mouth daily.      Marland Kitchen. PRODIGY TWIST TOP LANCETS 28G MISC CHECK BLOOD SUGAR DAILY  100 each  1  . trimethoprim-polymyxin b (POLYTRIM) ophthalmic solution       . VYTORIN 10-20 MG per tablet TAKE 1/2 TABLET BY MOUTH EVERY NIGHT AT BEDTIME FOR 30 DAYS  15 tablet  3   No current facility-administered medications for this visit.    Allergies  Allergen Reactions  . Penicillins Hives  . Sulfa Antibiotics Other (See Comments)    Tongue swells    Past Medical History  Diagnosis Date  . Coronary artery disease     Remote PCI. Had BMS to RCA and DES stent to Diagonal in Jan. 2012   . Diabetes mellitus     long standing  . Diastolic heart failure     EF 55 to 60% per cath in Jan 2012  . Hyperlipidemia   . Hypertension   . Carotid bruit   . Gout   . Obesity   . TIA (transient ischemic attack)   .  Chronic kidney disease     Stage 3  . Ischemic cardiomyopathy     with prior EF of 35%  . Anemia   . Proteinuria   . NSTEMI (non-ST elevated myocardial infarction) Jan 2012    with PCI to RCA and DX per Dr. Excell Seltzerooper  . Carotid artery occlusion   . Stroke 2013    ?  mini    . DVT (deep venous thrombosis)     Past Surgical History  Procedure Laterality Date  . Appendectomy    . Tonsillectomy    . Left ankle fusion    . Distal rca  04/05/2010    stent  . Fracture surgery Right 2005    bilateral ankles     History  Smoking status  . Former Smoker  . Quit date: 03/21/1968  Smokeless tobacco  . Never Used    History  Alcohol Use No    No family history on file.  Review of Systems: The review of systems is per the HPI.  All other systems were reviewed and are negative.  Physical Exam: Ht 5\' 1"  (1.549 m)  Wt 134 lb 1.9 oz (60.836 kg)  BMI 25.35 kg/m2 Patient is very pleasant and in no acute distress. Seems a little more flat in her affect today. She is moving slow today. Skin is warm and dry. Color is normal.  HEENT is unremarkable. Normocephalic/atraumatic. PERRL. Sclera are nonicteric. Neck is supple. No masses. No JVD. Lungs are clear. Cardiac exam shows a regular rate and rhythm. Abdomen is soft. Extremities are with edema. Right leg tender to the touch. Brawny stasis changes. Gait and ROM are intact. No gross neurologic deficits noted.  Wt Readings from Last 3 Encounters:  07/22/13 134 lb 1.9 oz (60.836 kg)  07/11/13 137 lb (62.143 kg)  07/04/13 137 lb (62.143 kg)     LABORATORY DATA: Lab Results  Component Value Date   WBC 4.5 02/25/2013   HGB 12.0 02/25/2013   HCT 36.0 02/25/2013   PLT 144.0* 02/25/2013   GLUCOSE 130* 04/29/2013   CHOL 157 02/25/2013   TRIG 140.0 02/25/2013   HDL 49.90 02/25/2013   LDLCALC 79 02/25/2013   ALT 11 02/25/2013   AST 20 02/25/2013   NA  143 04/29/2013   K 4.7 04/29/2013   CL 112 04/29/2013   CREATININE 1.2 04/29/2013   BUN 22 04/29/2013    CO2 26 04/29/2013   TSH 2.297 04/04/2010   INR 1.09 04/15/2010   HGBA1C 6.7* 01/30/2013     Assessment / Plan: 1. HTN - stable control.   2. CAD - no symptoms reported - will continue with medical management   3. HLD   4. PVD - followed by [facility] with recent visit with Dr. Darrick PennaFields - his impression - has "Bilateral lower extremity peripheral arterial disease. I believe these numbers probably are fairly consistent and really unchanged since 2011. She has multiple comorbidities. She currently does not have limb threatening ischemia. If she develops rest pain in her right foot or nonhealing wound we would consider an arteriogram. However in light of her renal dysfunction I do not believe the risk of contrast exposure outweighs the benefits at this point. She will followup with us in 12 months time for repeat ABIs. She will followup sooner if she develops any the above symptoms. They'll return sooner if the wound in her right foot is not healed spontaneously". She seems to be having lots of pain issues - may need to get back to Dr. Darrick PennaFields to discuss arteriogram. She is seeing PCP next week as well.   Her cardiac status is stable but she looks to be slowing down in a generalized fashion to me today. I will see her in 6 months.    Patient is agreeable to this plan and will call if any problems develop in the interim.   Rosalio MacadamiaLori C. Rion Schnitzer, RN, ANP-C [facility] [address] [place], [place]  [id number] [phone]

## 2013-07-29 ENCOUNTER — Other Ambulatory Visit: Payer: PRIVATE HEALTH INSURANCE

## 2013-07-29 DIAGNOSIS — I10 Essential (primary) hypertension: Secondary | ICD-10-CM

## 2013-07-29 DIAGNOSIS — E1151 Type 2 diabetes mellitus with diabetic peripheral angiopathy without gangrene: Secondary | ICD-10-CM

## 2013-07-30 LAB — COMPREHENSIVE METABOLIC PANEL
ALBUMIN: 3.6 g/dL (ref 3.5–4.7)
ALT: 8 IU/L (ref 0–32)
AST: 15 IU/L (ref 0–40)
Albumin/Globulin Ratio: 2.1 (ref 1.1–2.5)
Alkaline Phosphatase: 41 IU/L (ref 39–117)
BUN / CREAT RATIO: 23 (ref 11–26)
BUN: 31 mg/dL — AB (ref 8–27)
CHLORIDE: 108 mmol/L (ref 97–108)
CO2: 23 mmol/L (ref 18–29)
Calcium: 8.7 mg/dL (ref 8.7–10.3)
Creatinine, Ser: 1.35 mg/dL — ABNORMAL HIGH (ref 0.57–1.00)
GFR calc non Af Amer: 36 mL/min/{1.73_m2} — ABNORMAL LOW (ref >59)
GFR, EST AFRICAN AMERICAN: 42 mL/min/{1.73_m2} — AB (ref >59)
Globulin, Total: 1.7 g/dL (ref 1.5–4.5)
Glucose: 128 mg/dL — ABNORMAL HIGH (ref 65–99)
Potassium: 4.8 mmol/L (ref 3.5–5.2)
Sodium: 144 mmol/L (ref 134–144)
Total Protein: 5.3 g/dL — ABNORMAL LOW (ref 6.0–8.5)

## 2013-07-30 LAB — HEMOGLOBIN A1C
Est. average glucose Bld gHb Est-mCnc: 157 mg/dL
Hgb A1c MFr Bld: 7.1 % — ABNORMAL HIGH (ref 4.8–5.6)

## 2013-07-31 ENCOUNTER — Encounter: Payer: Self-pay | Admitting: Nurse Practitioner

## 2013-07-31 ENCOUNTER — Ambulatory Visit (INDEPENDENT_AMBULATORY_CARE_PROVIDER_SITE_OTHER): Payer: PRIVATE HEALTH INSURANCE | Admitting: Nurse Practitioner

## 2013-07-31 VITALS — BP 122/60 | HR 61 | Temp 97.7°F | Resp 10 | Ht 60.0 in | Wt 137.0 lb

## 2013-07-31 DIAGNOSIS — K5900 Constipation, unspecified: Secondary | ICD-10-CM

## 2013-07-31 DIAGNOSIS — N189 Chronic kidney disease, unspecified: Secondary | ICD-10-CM

## 2013-07-31 DIAGNOSIS — I10 Essential (primary) hypertension: Secondary | ICD-10-CM

## 2013-07-31 DIAGNOSIS — F411 Generalized anxiety disorder: Secondary | ICD-10-CM

## 2013-07-31 DIAGNOSIS — E1159 Type 2 diabetes mellitus with other circulatory complications: Secondary | ICD-10-CM

## 2013-07-31 DIAGNOSIS — E1151 Type 2 diabetes mellitus with diabetic peripheral angiopathy without gangrene: Secondary | ICD-10-CM

## 2013-07-31 MED ORDER — GLUCOSE BLOOD VI STRP: ORAL_STRIP | Status: DC

## 2013-07-31 NOTE — Progress Notes (Signed)
Patient ID: [name], female   DOB: 1927-04-13, 80 y.o.   MRN: [id number]    Allergies  Allergen Reactions  . Penicillins Hives  . Sulfa Antibiotics Other (See Comments)    Tongue swells    Chief Complaint  Patient presents with  . Medical Management of Chronic Issues    3 month follow-up, discuss labs (copy printed)     HPI: Patient is a 80 y.o. female seen in the office today for routine follow up.  Apparently pt thought she was not supposed to take victoza so she stopped in then got a call that she was supposed to take medication so she started it back 2 weeks ago.  Does not take blood sugars at home because her meter does not work right   Following up with cardiology and vascular, no changes at this time, right leg with occlusive disease but due to age there will be no intervention unless they absolutely have to.  Increased pain leg, but not taking medication for it- does not want to take medications   Stopped taking celexa, is not having any more anxiety,no depression- problem was with family and now she reports she does not have the same issues   Reports no falls in over a year  Walks frequently and does routine exercise   MMSE done nov 2104 Review of Systems:  Review of Systems  Constitutional: Negative for malaise/fatigue.  HENT: Negative for congestion.   Respiratory: Negative for cough and shortness of breath.   Cardiovascular: Negative for chest pain, palpitations and leg swelling.  Genitourinary: Negative for dysuria and urgency.  Musculoskeletal: Positive for myalgias.  Skin: Negative.   Neurological: Negative for dizziness, weakness and headaches.  Endo/Heme/Allergies: Negative for environmental allergies.  Psychiatric/Behavioral: Negative for depression. The patient is not nervous/anxious and does not have insomnia.      Past Medical History  Diagnosis Date  . Coronary artery disease     Remote PCI. Had BMS to RCA and DES stent to Diagonal in Jan.  2012   . Diabetes mellitus     long standing  . Diastolic heart failure     EF 55 to 60% per cath in Jan 2012  . Hyperlipidemia   . Hypertension   . Carotid bruit   . Gout   . Obesity   . TIA (transient ischemic attack)   . Chronic kidney disease     Stage 3  . Ischemic cardiomyopathy     with prior EF of 35%  . Anemia   . Proteinuria   . NSTEMI (non-ST elevated myocardial infarction) Jan 2012    with PCI to RCA and DX per Dr. Excell Seltzerooper  . Carotid artery occlusion   . Stroke 2013    ?  mini    . DVT (deep venous thrombosis)    Past Surgical History  Procedure Laterality Date  . Appendectomy    . Tonsillectomy    . Left ankle fusion    . Distal rca  04/05/2010    stent  . Fracture surgery Right 2005    bilateral ankles    Social History:   reports that she quit smoking about 45 years ago. She has never used smokeless tobacco. She reports that she does not drink alcohol or use illicit drugs.  History reviewed. No pertinent family history.  Medications: Patient's Medications  New Prescriptions   No medications on file  Previous Medications   AMLODIPINE (NORVASC) 5 MG TABLET    Take 1  tablet (5 mg total) by mouth daily.   ASPIRIN 81 MG TABLET    Take 81 mg by mouth daily.     BROMDAY 0.09 % SOLN    Place 1 drop into the left eye daily.   CARVEDILOL (COREG) 3.125 MG TABLET    Take one tablet by mouth twice daily for blood pressure   CETIRIZINE (ZYRTEC) 5 MG TABLET    TAKE 1 TABLET BY MOUTH ONCE DAILY AT BEDTIME   CLONIDINE (CATAPRES) 0.1 MG TABLET    TAKE 1 TABLET BY MOUTH EVERY DAY   COLESEVELAM (WELCHOL) 625 MG TABLET    Take 1 tablet (675 mg) twice daily with meals   EASY TOUCH PEN NEEDLES 32G X 6 MM MISC    USE 1 NEEDLE AT BEDTIME   FLUTICASONE (FLONASE) 50 MCG/ACT NASAL SPRAY    USE 2 SPRAYS IN EACH NOSTRIL DAILY   FREESTYLE LITE TEST STRIP    CHECK BLOOD SUGAR DAILY   LIRAGLUTIDE (VICTOZA) 18 MG/3ML SOPN    INJECT 0.6 MG SUBCUTANEOUSLY EVERY DAY   LISINOPRIL  (PRINIVIL,ZESTRIL) 10 MG TABLET    Take 1 tablet (10 mg total) by mouth 2 (two) times daily.   LOTEMAX 0.5 % OPHTHALMIC SUSPENSION    Place 1 drop into the left eye 3 (three) times daily.    LOVAZA 1 G CAPSULE    TAKE 2 CAPSULES BY MOUTH EVERY NIGHT AT BEDTIME FOR 30 DAYS   NITROGLYCERIN (NITROSTAT) 0.4 MG SL TABLET    Place 1 tablet (0.4 mg total) under the tongue every 5 (five) minutes as needed.   POLYETHYLENE GLYCOL (MIRALAX / GLYCOLAX) PACKET    Take 17 g by mouth daily.   PRODIGY TWIST TOP LANCETS 28G MISC    CHECK BLOOD SUGAR DAILY   TRIMETHOPRIM-POLYMYXIN B (POLYTRIM) OPHTHALMIC SOLUTION       VYTORIN 10-20 MG PER TABLET    TAKE 1/2 TABLET BY MOUTH EVERY NIGHT AT BEDTIME FOR 30 DAYS  Modified Medications   No medications on file  Discontinued Medications   No medications on file     Physical Exam:  Filed Vitals:   07/31/13 0859  BP: 122/60  Pulse: 61  Temp: 97.7 F (36.5 C)  TempSrc: Oral  Resp: 10  Height: 5' (1.524 m)  Weight: 137 lb (62.143 kg)  SpO2: 91%    Physical Exam  Constitutional: She is oriented to person, place, and time and well-developed, well-nourished, and in no distress.  HENT:  Head: Normocephalic and atraumatic.  Eyes: Conjunctivae and EOM are normal. Pupils are equal, round, and reactive to light.  Neck: Normal range of motion. Neck supple.  Cardiovascular: Normal rate, regular rhythm and normal heart sounds.   Pulmonary/Chest: Effort normal and breath sounds normal. No respiratory distress.  Abdominal: Soft. Bowel sounds are normal. She exhibits no distension.  Musculoskeletal: She exhibits no edema and no tenderness.  Neurological: She is alert and oriented to person, place, and time.  Skin: Skin is warm and dry. She is not diaphoretic.  Psychiatric: Affect normal.     Labs reviewed: Basic Metabolic Panel:  Recent Labs  16/10/96 0919 04/29/13 0959 07/29/13 0900  NA 143 143 144  K 4.5 4.7 4.8  CL 109 112 108  CO2 26 26 23     GLUCOSE 143* 130* 128*  BUN 29* 22 31*  CREATININE 1.3* 1.2 1.35*  CALCIUM 8.7 8.5 8.7   Liver Function Tests:  Recent Labs  01/30/13 1215 02/25/13 1206 07/29/13 0900  AST 16 20 15   ALT 8 11 8   ALKPHOS 57 46 41  BILITOT 0.4 0.6 <0.2  PROT 5.6* 6.2 5.3*  ALBUMIN  --  3.6  --    No results found for this basename: LIPASE, AMYLASE,  in the last 8760 hours No results found for this basename: AMMONIA,  in the last 8760 hours CBC:  Recent Labs  02/25/13 1206  WBC 4.5  NEUTROABS 2.0  HGB 12.0  HCT 36.0  MCV 91.1  PLT 144.0*   Lipid Panel:  Recent Labs  01/30/13 1215 02/25/13 1206  CHOL  --  157  HDL 58 49.90  LDLCALC 52 79  TRIG 80 140.0  CHOLHDL 2.2 3   TSH: No results found for this basename: TSH,  in the last 8760 hours A1C: Lab Results  Component Value Date   HGBA1C 7.1* 07/29/2013     Assessment/Plan  1. DM (diabetes mellitus), type 2 with peripheral vascular complications -does not have functioning glucose meter, given new meter with education on use -pt has not taken medications for over 2 months, only taken it for 2 weeks, A1c at 7.1, will have her stop medication and take blood sugars 3 times a week before breakfast -to bring blood sugar readings to visit in 6 weeks for follow up  2. Hypertension -conts to follow up with cardiology, blood pressure well controlled at this time -conts on norvasc, lisinopril, catapres  3. Chronic kidney disease -stable, encouraged good hydration and to avoid NSAIDs -will cont to monitor  4. Anxiety state, unspecified -has stopped taking celexa due to decrease in stress at home -anxiety no longer a problem at this time -will cont to monitor   5. Constipation -does not like to take miralax so has stopped medication -increase in fluid intake and eating prunes which controls symptoms

## 2013-07-31 NOTE — Patient Instructions (Signed)
Will stop victoza but you need to take blood sugars in the morning before breakfast  3 times a week Record this and bring to next office visit  Will follow up in 6 weeks regarding blood sugars

## 2013-08-07 ENCOUNTER — Other Ambulatory Visit: Payer: Self-pay | Admitting: Internal Medicine

## 2013-08-07 ENCOUNTER — Other Ambulatory Visit: Payer: Self-pay | Admitting: Nurse Practitioner

## 2013-08-07 ENCOUNTER — Other Ambulatory Visit: Payer: Self-pay | Admitting: *Deleted

## 2013-08-07 MED ORDER — NITROGLYCERIN 0.4 MG SL SUBL: SUBLINGUAL_TABLET | SUBLINGUAL | Status: DC

## 2013-08-07 NOTE — Telephone Encounter (Signed)
[facility] 

## 2013-08-26 ENCOUNTER — Encounter (INDEPENDENT_AMBULATORY_CARE_PROVIDER_SITE_OTHER): Payer: PRIVATE HEALTH INSURANCE | Admitting: Ophthalmology

## 2013-08-26 DIAGNOSIS — E11311 Type 2 diabetes mellitus with unspecified diabetic retinopathy with macular edema: Secondary | ICD-10-CM

## 2013-08-26 DIAGNOSIS — E11319 Type 2 diabetes mellitus with unspecified diabetic retinopathy without macular edema: Secondary | ICD-10-CM

## 2013-08-26 DIAGNOSIS — E1139 Type 2 diabetes mellitus with other diabetic ophthalmic complication: Secondary | ICD-10-CM

## 2013-08-26 DIAGNOSIS — H35039 Hypertensive retinopathy, unspecified eye: Secondary | ICD-10-CM

## 2013-08-26 DIAGNOSIS — I10 Essential (primary) hypertension: Secondary | ICD-10-CM

## 2013-08-26 DIAGNOSIS — H43819 Vitreous degeneration, unspecified eye: Secondary | ICD-10-CM

## 2013-08-26 DIAGNOSIS — E1165 Type 2 diabetes mellitus with hyperglycemia: Secondary | ICD-10-CM

## 2013-09-04 ENCOUNTER — Other Ambulatory Visit: Payer: Self-pay | Admitting: Internal Medicine

## 2013-09-11 ENCOUNTER — Ambulatory Visit: Payer: Self-pay | Admitting: Nurse Practitioner

## 2013-09-12 ENCOUNTER — Ambulatory Visit (INDEPENDENT_AMBULATORY_CARE_PROVIDER_SITE_OTHER): Payer: PRIVATE HEALTH INSURANCE | Admitting: Nurse Practitioner

## 2013-09-12 ENCOUNTER — Encounter: Payer: Self-pay | Admitting: Nurse Practitioner

## 2013-09-12 VITALS — BP 152/76 | HR 64 | Temp 97.1°F | Wt 142.0 lb

## 2013-09-12 DIAGNOSIS — K5900 Constipation, unspecified: Secondary | ICD-10-CM

## 2013-09-12 DIAGNOSIS — E1129 Type 2 diabetes mellitus with other diabetic kidney complication: Secondary | ICD-10-CM

## 2013-09-12 DIAGNOSIS — E1121 Type 2 diabetes mellitus with diabetic nephropathy: Secondary | ICD-10-CM

## 2013-09-12 DIAGNOSIS — R05 Cough: Secondary | ICD-10-CM

## 2013-09-12 DIAGNOSIS — R059 Cough, unspecified: Secondary | ICD-10-CM

## 2013-09-12 DIAGNOSIS — I10 Essential (primary) hypertension: Secondary | ICD-10-CM

## 2013-09-12 DIAGNOSIS — N058 Unspecified nephritic syndrome with other morphologic changes: Secondary | ICD-10-CM

## 2013-09-12 MED ORDER — LOSARTAN POTASSIUM 50 MG PO TABS: 50.0000 mg | ORAL_TABLET | Freq: Every day | ORAL | Status: DC

## 2013-09-12 NOTE — Progress Notes (Signed)
Patient ID: [name], female   DOB: [birth year], 80 y.o.   MRN: [id number]    Allergies  Allergen Reactions  . Penicillins Hives  . Sulfa Antibiotics Other (See Comments)    Tongue swells    Chief Complaint  Patient presents with  . Medical Management of Chronic Issues    Follow up Blood Sugar. Patient is with daughter [name]    HPI: Patient is a 80 y.o. female seen in the office today for follow-up on diabetes, has been off all diabetic medications for 6 weeks. Blood sugars have been good.  Reports severe nerve pain- stabbing pain in bilateral LE, worse on right leg and foot that has been going on for a awhile, previous did not want medication but now is worse would like medication.    Reports increase in constipation- does not take anything daily but will take MOM occasionally but this is not enough, now having bad hemorrhoids from the constipation   Review of Systems:  Review of Systems  Constitutional: Negative for malaise/fatigue.  Respiratory: Positive for cough (dry cough). Negative for shortness of breath.   Cardiovascular: Positive for leg swelling (chronic, without change). Negative for chest pain.  Gastrointestinal: Positive for constipation. Negative for heartburn, abdominal pain and diarrhea.  Genitourinary: Negative for dysuria, urgency and frequency.  Musculoskeletal: Positive for back pain. Negative for falls.  Skin: Negative.   Neurological: Positive for tingling (in lower extermities ). Negative for focal weakness and weakness.     Past Medical History  Diagnosis Date  . Coronary artery disease     Remote PCI. Had BMS to RCA and DES stent to Diagonal in Jan. 2012   . Diabetes mellitus     long standing  . Diastolic heart failure     EF 55 to 60% per cath in Jan 2012  . Hyperlipidemia   . Hypertension   . Carotid bruit   . Gout   . Obesity   . TIA (transient ischemic attack)   . Chronic kidney disease     Stage 3  . Ischemic cardiomyopathy     with  prior EF of 35%  . Anemia   . Proteinuria   . NSTEMI (non-ST elevated myocardial infarction) Jan 2012    with PCI to RCA and DX per Dr. Excell Seltzer  . Carotid artery occlusion   . Stroke 2013    ?  mini    . DVT (deep venous thrombosis)    Past Surgical History  Procedure Laterality Date  . Appendectomy    . Tonsillectomy    . Left ankle fusion    . Distal rca  04/05/2010    stent  . Fracture surgery Right 2005    bilateral ankles    Social History:   reports that she quit smoking about 45 years ago. She has never used smokeless tobacco. She reports that she does not drink alcohol or use illicit drugs.  No family history on file.  Medications: Patient's Medications  New Prescriptions   No medications on file  Previous Medications   AMLODIPINE (NORVASC) 5 MG TABLET    Take 1 tablet (5 mg total) by mouth daily.   ASPIRIN 81 MG TABLET    Take 81 mg by mouth daily.     BROMDAY 0.09 % SOLN    Place 1 drop into the left eye daily.   CARVEDILOL (COREG) 3.125 MG TABLET    Take one tablet by mouth twice daily for blood pressure  CETIRIZINE (ZYRTEC) 5 MG TABLET    TAKE 1 TABLET BY MOUTH ONCE DAILY AT BEDTIME   CLONIDINE (CATAPRES) 0.1 MG TABLET    TAKE 1 TABLET BY MOUTH EVERY DAY   COLESEVELAM (WELCHOL) 625 MG TABLET    Take 1 tablet (675 mg) twice daily with meals   EASY TOUCH PEN NEEDLES 32G X 6 MM MISC    USE 1 NEEDLE AT BEDTIME   FLUTICASONE (FLONASE) 50 MCG/ACT NASAL SPRAY    USE 2 SPRAYS IN EACH NOSTRIL DAILY   LISINOPRIL (PRINIVIL,ZESTRIL) 10 MG TABLET    TAKE 1 TABLET BY MOUTH TWICE DAILY   LOTEMAX 0.5 % OPHTHALMIC SUSPENSION    Place 1 drop into the left eye 3 (three) times daily.    NITROGLYCERIN (NITROSTAT) 0.4 MG SL TABLET    Dissolve one tablet under tongue as needed for chest pain. May repeat as needed every 5 minutes up to 3 doses. Seek medical attention if pain is not relieved after 3 doses   OMEGA-3 ACID ETHYL ESTERS (LOVAZA) 1 G CAPSULE    TAKE 2 CAPSULES BY MOUTH EVERY  NIGHT AT BEDTIME FOR 30 DAYS   ONETOUCH DELICA LANCETS 33G MISC    USE TO TEST BLOOD SUGAR DAILY AS DIRECTED   PRODIGY TWIST TOP LANCETS 28G MISC    CHECK BLOOD SUGAR DAILY   TRIMETHOPRIM-POLYMYXIN B (POLYTRIM) OPHTHALMIC SOLUTION       VYTORIN 10-20 MG PER TABLET    TAKE 1/2 TABLET BY MOUTH EVERY NIGHT AT BEDTIME FOR 30 DAYS  Modified Medications   No medications on file  Discontinued Medications   No medications on file     Physical Exam:  Filed Vitals:   09/12/13 1310  BP: 152/76  Pulse: 64  Temp: 97.1 F (36.2 C)  TempSrc: Oral  Weight: 142 lb (64.411 kg)    Physical Exam  Constitutional: She is oriented to person, place, and time and well-developed, well-nourished, and in no distress.  HENT:  Head: Normocephalic and atraumatic.  Eyes: Conjunctivae and EOM are normal. Pupils are equal, round, and reactive to light.  Neck: Normal range of motion. Neck supple.  Cardiovascular: Normal rate, regular rhythm and normal heart sounds.   Pulmonary/Chest: Effort normal and breath sounds normal. No respiratory distress.  Abdominal: Soft. Bowel sounds are normal. She exhibits no distension.  Musculoskeletal: She exhibits edema (1+ edema, stable). She exhibits no tenderness.  Neurological: She is alert and oriented to person, place, and time. A sensory deficit (decreased sensation and vibratory to left ) is present.  Skin: Skin is warm and dry. She is not diaphoretic.  Psychiatric: Affect normal.     Labs reviewed: Basic Metabolic Panel:  Recent Labs  16/12/9599/05/15 0919 04/29/13 0959 07/29/13 0900  NA 143 143 144  K 4.5 4.7 4.8  CL 109 112 108  CO2 26 26 23   GLUCOSE 143* 130* 128*  BUN 29* 22 31*  CREATININE 1.3* 1.2 1.35*  CALCIUM 8.7 8.5 8.7   Liver Function Tests:  Recent Labs  01/30/13 1215 02/25/13 1206 07/29/13 0900  AST 16 20 15   ALT 8 11 8   ALKPHOS 57 46 41  BILITOT 0.4 0.6 <0.2  PROT 5.6* 6.2 5.3*  ALBUMIN  --  3.6  --    No results found for this  basename: LIPASE, AMYLASE,  in the last 8760 hours No results found for this basename: AMMONIA,  in the last 8760 hours CBC:  Recent Labs  02/25/13 1206  WBC 4.5  NEUTROABS 2.0  HGB 12.0  HCT 36.0  MCV 91.1  PLT 144.0*   Lipid Panel:  Recent Labs  01/30/13 1215 02/25/13 1206  CHOL  --  157  HDL 58 49.90  LDLCALC 52 79  TRIG 80 140.0  CHOLHDL 2.2 3   TSH: No results found for this basename: TSH,  in the last 8760 hours A1C: Lab Results  Component Value Date   HGBA1C 7.1* 07/29/2013     Assessment/Plan 1. Essential hypertension, benign STOP lisinopril- may be cause of cough START losartan 50 mg daily for blood pressure  2. Type 2 diabetes mellitus with diabetic nephropathy -has done well off medication at this time -with CKD, has been stable  3. Neuropathy -decrease sensation to touch and vibration to left  -will start lyrica 50 mg qhs only due to CKD at this timeto see if this helps symptoms   4. Cough -dry cough for several weeks per pt, no signs of infection -will start by changing ACE to ARB, if no improvement will need to get chest xray (hx of smoking) And/or start PPI due to possible GERD   5. Unspecified constipation  -to increase fluid intake, activity and fiber Senokot-S 1- 2 times daily for constipation- start by taking 1 time a day and if needed may increase to 2 daily  Anusol supp OTC for hemorrhoids   To follow up in 4-6 weeks on cough, constipation, and neuropathy

## 2013-09-12 NOTE — Patient Instructions (Addendum)
Anusol supp OTC for hemorrhoids  Senokot-S 1- 2 times daily for constipation- start by taking 1 time a day and if needed may increase to 2 daily  STOP lisinopril- may be cause of cough START losartan 50 mg daily for blood pressure For nerve pain take lyrica 50 mg ONCE daily at night   Constipation Constipation is when a person has fewer than three bowel movements a week, has difficulty having a bowel movement, or has stools that are dry, hard, or larger than normal. As people grow older, constipation is more common. If you try to fix constipation with medicines that make you have a bowel movement (laxatives), the problem may get worse. Long-term laxative use may cause the muscles of the colon to become weak. A low-fiber diet, not taking in enough fluids, and taking certain medicines may make constipation worse.  CAUSES   Certain medicines, such as antidepressants, pain medicine, iron supplements, antacids, and water pills.   Certain diseases, such as diabetes, irritable bowel syndrome (IBS), thyroid disease, or depression.   Not drinking enough water.   Not eating enough fiber-rich foods.   Stress or travel.   Lack of physical activity or exercise.   Ignoring the urge to have a bowel movement.   Using laxatives too much.  SIGNS AND SYMPTOMS   Having fewer than three bowel movements a week.   Straining to have a bowel movement.   Having stools that are hard, dry, or larger than normal.   Feeling full or bloated.   Pain in the lower abdomen.   Not feeling relief after having a bowel movement.  DIAGNOSIS  Your health care provider will take a medical history and perform a physical exam. Further testing may be done for severe constipation. Some tests may include:  A barium enema X-ray to examine your rectum, colon, and, sometimes, your small intestine.   A sigmoidoscopy to examine your lower colon.   A colonoscopy to examine your entire colon. TREATMENT   Treatment will depend on the severity of your constipation and what is causing it. Some dietary treatments include drinking more fluids and eating more fiber-rich foods. Lifestyle treatments may include regular exercise. If these diet and lifestyle recommendations do not help, your health care provider may recommend taking over-the-counter laxative medicines to help you have bowel movements. Prescription medicines may be prescribed if over-the-counter medicines do not work.  HOME CARE INSTRUCTIONS   Eat foods that have a lot of fiber, such as fruits, vegetables, whole grains, and beans.  Limit foods high in fat and processed sugars, such as french fries, hamburgers, cookies, candies, and soda.   A fiber supplement may be added to your diet if you cannot get enough fiber from foods.   Drink enough fluids to keep your urine clear or pale yellow.   Exercise regularly or as directed by your health care provider.   Go to the restroom when you have the urge to go. Do not hold it.   Only take over-the-counter or prescription medicines as directed by your health care provider. Do not take other medicines for constipation without talking to your health care provider first.  SEEK IMMEDIATE MEDICAL CARE IF:   You have bright red blood in your stool.   Your constipation lasts for more than 4 days or gets worse.   You have abdominal or rectal pain.   You have thin, pencil-like stools.   You have unexplained weight loss. MAKE SURE YOU:   Understand these instructions.  Will watch your condition.  Will get help right away if you are not doing well or get worse. Document Released: 12/04/2003 Document Revised: 03/12/2013 Document Reviewed: 12/17/2012 [facility] Patient Information 2015 [place], [place]. This information is not intended to replace advice given to you by your health care provider. Make sure you discuss any questions you have with your health care provider.

## 2013-09-14 ENCOUNTER — Encounter (HOSPITAL_COMMUNITY): Payer: Self-pay | Admitting: Emergency Medicine

## 2013-09-14 DIAGNOSIS — Z862 Personal history of diseases of the blood and blood-forming organs and certain disorders involving the immune mechanism: Secondary | ICD-10-CM | POA: Diagnosis not present

## 2013-09-14 DIAGNOSIS — I2510 Atherosclerotic heart disease of native coronary artery without angina pectoris: Secondary | ICD-10-CM | POA: Insufficient documentation

## 2013-09-14 DIAGNOSIS — E119 Type 2 diabetes mellitus without complications: Secondary | ICD-10-CM | POA: Diagnosis not present

## 2013-09-14 DIAGNOSIS — I252 Old myocardial infarction: Secondary | ICD-10-CM | POA: Insufficient documentation

## 2013-09-14 DIAGNOSIS — N189 Chronic kidney disease, unspecified: Secondary | ICD-10-CM | POA: Insufficient documentation

## 2013-09-14 DIAGNOSIS — M7989 Other specified soft tissue disorders: Secondary | ICD-10-CM | POA: Diagnosis not present

## 2013-09-14 DIAGNOSIS — E785 Hyperlipidemia, unspecified: Secondary | ICD-10-CM | POA: Diagnosis not present

## 2013-09-14 DIAGNOSIS — Z8673 Personal history of transient ischemic attack (TIA), and cerebral infarction without residual deficits: Secondary | ICD-10-CM | POA: Insufficient documentation

## 2013-09-14 DIAGNOSIS — IMO0002 Reserved for concepts with insufficient information to code with codable children: Secondary | ICD-10-CM | POA: Insufficient documentation

## 2013-09-14 DIAGNOSIS — E669 Obesity, unspecified: Secondary | ICD-10-CM | POA: Diagnosis not present

## 2013-09-14 DIAGNOSIS — Z87891 Personal history of nicotine dependence: Secondary | ICD-10-CM | POA: Insufficient documentation

## 2013-09-14 DIAGNOSIS — Z9889 Other specified postprocedural states: Secondary | ICD-10-CM | POA: Insufficient documentation

## 2013-09-14 DIAGNOSIS — Z880 Allergy status to penicillin: Secondary | ICD-10-CM | POA: Diagnosis not present

## 2013-09-14 DIAGNOSIS — Z79899 Other long term (current) drug therapy: Secondary | ICD-10-CM | POA: Diagnosis not present

## 2013-09-14 DIAGNOSIS — I129 Hypertensive chronic kidney disease with stage 1 through stage 4 chronic kidney disease, or unspecified chronic kidney disease: Secondary | ICD-10-CM | POA: Diagnosis not present

## 2013-09-14 DIAGNOSIS — Z86718 Personal history of other venous thrombosis and embolism: Secondary | ICD-10-CM | POA: Diagnosis not present

## 2013-09-14 DIAGNOSIS — Z7982 Long term (current) use of aspirin: Secondary | ICD-10-CM | POA: Insufficient documentation

## 2013-09-14 DIAGNOSIS — M79609 Pain in unspecified limb: Secondary | ICD-10-CM | POA: Diagnosis present

## 2013-09-14 NOTE — ED Notes (Signed)
The pt has had pain and itching in her rt lower leg for 3 days and the itching has gone up to her rt hand.  She was seen at her regular doctor 2 days ago

## 2013-09-15 ENCOUNTER — Emergency Department (HOSPITAL_COMMUNITY)
Admission: EM | Admit: 2013-09-15 | Discharge: 2013-09-15 | Disposition: A | Discharge status: Home / Self Care | Payer: PRIVATE HEALTH INSURANCE | Attending: Emergency Medicine | Admitting: Emergency Medicine

## 2013-09-15 ENCOUNTER — Ambulatory Visit (HOSPITAL_COMMUNITY)
Admission: RE | Admit: 2013-09-15 | Discharge: 2013-09-15 | Disposition: A | Discharge status: Home / Self Care | Payer: PRIVATE HEALTH INSURANCE | Source: Ambulatory Visit | Attending: Emergency Medicine | Admitting: Emergency Medicine

## 2013-09-15 DIAGNOSIS — M79609 Pain in unspecified limb: Secondary | ICD-10-CM | POA: Insufficient documentation

## 2013-09-15 DIAGNOSIS — M7989 Other specified soft tissue disorders: Secondary | ICD-10-CM | POA: Diagnosis not present

## 2013-09-15 DIAGNOSIS — M79604 Pain in right leg: Secondary | ICD-10-CM

## 2013-09-15 DIAGNOSIS — N289 Disorder of kidney and ureter, unspecified: Secondary | ICD-10-CM

## 2013-09-15 LAB — BASIC METABOLIC PANEL
BUN: 35 mg/dL — ABNORMAL HIGH (ref 6–23)
CHLORIDE: 107 meq/L (ref 96–112)
CO2: 24 meq/L (ref 19–32)
Calcium: 8.7 mg/dL (ref 8.4–10.5)
Creatinine, Ser: 1.3 mg/dL — ABNORMAL HIGH (ref 0.50–1.10)
GFR calc Af Amer: 42 mL/min — ABNORMAL LOW (ref >90)
GFR calc non Af Amer: 37 mL/min — ABNORMAL LOW (ref >90)
Glucose, Bld: 140 mg/dL — ABNORMAL HIGH (ref 70–99)
Potassium: 4.6 mEq/L (ref 3.7–5.3)
SODIUM: 143 meq/L (ref 137–147)

## 2013-09-15 LAB — CBC WITH DIFFERENTIAL/PLATELET
BASOS ABS: 0 10*3/uL (ref 0.0–0.1)
Basophils Relative: 0 % (ref 0–1)
Eosinophils Absolute: 0.2 10*3/uL (ref 0.0–0.7)
Eosinophils Relative: 4 % (ref 0–5)
HCT: 32.5 % — ABNORMAL LOW (ref 36.0–46.0)
Hemoglobin: 10.6 g/dL — ABNORMAL LOW (ref 12.0–15.0)
LYMPHS PCT: 32 % (ref 12–46)
Lymphs Abs: 1.2 10*3/uL (ref 0.7–4.0)
MCH: 30.7 pg (ref 26.0–34.0)
MCHC: 32.6 g/dL (ref 30.0–36.0)
MCV: 94.2 fL (ref 78.0–100.0)
Monocytes Absolute: 0.3 10*3/uL (ref 0.1–1.0)
Monocytes Relative: 8 % (ref 3–12)
NEUTROS ABS: 2.1 10*3/uL (ref 1.7–7.7)
Neutrophils Relative %: 56 % (ref 43–77)
PLATELETS: 139 10*3/uL — AB (ref 150–400)
RBC: 3.45 MIL/uL — ABNORMAL LOW (ref 3.87–5.11)
RDW: 13.7 % (ref 11.5–15.5)
WBC: 3.8 10*3/uL — AB (ref 4.0–10.5)

## 2013-09-15 LAB — SEDIMENTATION RATE: SED RATE: 14 mm/h (ref 0–22)

## 2013-09-15 MED ORDER — CEFAZOLIN SODIUM 1-5 GM-% IV SOLN
1.0000 g | Freq: Once | INTRAVENOUS | Status: AC
  Administered 2013-09-15: 1 g via INTRAVENOUS
  Filled 2013-09-15: qty 50

## 2013-09-15 MED ORDER — ENOXAPARIN SODIUM 80 MG/0.8ML ~~LOC~~ SOLN
1.0000 mg/kg | Freq: Once | SUBCUTANEOUS | Status: AC
  Administered 2013-09-15: 65 mg via SUBCUTANEOUS
  Filled 2013-09-15: qty 0.8

## 2013-09-15 MED ORDER — DIPHENHYDRAMINE HCL 25 MG PO CAPS
25.0000 mg | ORAL_CAPSULE | Freq: Once | ORAL | Status: AC
  Administered 2013-09-15: 25 mg via ORAL
  Filled 2013-09-15: qty 1

## 2013-09-15 MED ORDER — CEPHALEXIN 500 MG PO CAPS: 500.0000 mg | ORAL_CAPSULE | Freq: Four times a day (QID) | ORAL | Status: DC

## 2013-09-15 NOTE — Discharge Instructions (Signed)
The swelling and redness in your leg may be from infection, but also may be due to a blood clot in the leg. Please return later today for the Doppler test to see if you have a blood clot in the leg. If this test does not show a blood clot, then fill the prescription for the antibiotic and take it as directed.  Deep Vein Thrombosis A deep vein thrombosis (DVT) is a blood clot that develops in the deep, larger veins of the leg, arm, or pelvis. These are more dangerous than clots that might form in veins near the surface of the body. A DVT can lead to complications if the clot breaks off and travels in the bloodstream to the lungs.  A DVT can damage the valves in your leg veins, so that instead of flowing upward, the blood pools in the lower leg. This is called post-thrombotic syndrome, and it can result in pain, swelling, discoloration, and sores on the leg. CAUSES Usually, several things contribute to blood clots forming. Contributing factors include:  The flow of blood slows down.  The inside of the vein is damaged in some way.  You have a condition that makes blood clot more easily. RISK FACTORS Some people are more likely than others to develop blood clots. Risk factors include:   Older age, especially over 66 years of age.  Having a family history of blood clots or if you have already had a blot clot.  Having major or lengthy surgery. This is especially true for surgery on the hip, knee, or belly (abdomen). Hip surgery is particularly high risk.  Breaking a hip or leg.  Sitting or lying still for a long time. This includes long-distance travel, paralysis, or recovery from an illness or surgery.  Having cancer or cancer treatment.  Having a long, thin tube (catheter) placed inside a vein during a medical procedure.  Being overweight (obese).  Pregnancy and childbirth.  Hormone changes make the blood clot more easily during pregnancy.  The fetus puts pressure on the veins of the  pelvis.  There is a risk of injury to veins during delivery or a caesarean. The risk is highest just after childbirth.  Medicines with the female hormone estrogen. This includes birth control pills and hormone replacement therapy.  Smoking.  Other circulation or heart problems.  SIGNS AND SYMPTOMS When a clot forms, it can either partially or totally block the blood flow in that vein. Symptoms of a DVT can include:  Swelling of the leg or arm, especially if one side is much worse.  Warmth and redness of the leg or arm, especially if one side is much worse.  Pain in an arm or leg. If the clot is in the leg, symptoms may be more noticeable or worse when standing or walking. The symptoms of a DVT that has traveled to the lungs (pulmonary embolism, PE) usually start suddenly and include:  Shortness of breath.  Coughing.  Coughing up blood or blood-tinged phlegm.  Chest pain. The chest pain is often worse with deep breaths.  Rapid heartbeat. Anyone with these symptoms should get emergency medical treatment right away. Call your local emergency services (911 in the [place] if you have these symptoms. DIAGNOSIS If a DVT is suspected, your health care provider will take a full medical history and perform a physical exam. Tests that also may be required include:  Blood tests, including studies of the clotting properties of the blood.  Ultrasonography to see if you  have clots in your legs or lungs.  X-rays to show the flow of blood when dye is injected into the veins (venography).  Studies of your lungs if you have any chest symptoms. PREVENTION  Exercise the legs regularly. Take a brisk 30-minute walk every day.  Maintain a weight that is appropriate for your height.  Avoid sitting or lying in bed for long periods of time without moving your legs.  Women, particularly those over the age of 35 years, should consider the risks and benefits of taking estrogen medicines, including  birth control pills.  Do not smoke, especially if you take estrogen medicines.  Long-distance travel can increase your risk of DVT. You should exercise your legs by walking or pumping the muscles every hour.  In-hospital prevention:  Many of the risk factors above relate to situations that exist with hospitalization, either for illness, injury, or elective surgery.  Your health care provider will assess you for the need for venous thromboembolism prophylaxis when you are admitted to the hospital. If you are having surgery, your surgeon will assess you the day of or day after surgery.  Prevention may include medical and nonmedical measures. TREATMENT Once identified, a DVT can be treated. It can also be prevented in some circumstances. Once you have had a DVT, you may be at increased risk for a DVT in the future. The most common treatment for DVT is blood thinning (anticoagulant) medicine, which reduces the blood's tendency to clot. Anticoagulants can stop new blood clots from forming and stop old ones from growing. They cannot dissolve existing clots. Your body does this by itself over time. Anticoagulants can be given by mouth, by IV access, or by injection. Your health care provider will determine the best program for you. Other medicines or treatments that may be used are:  Heparin or related medicines (low molecular weight heparin) are usually the first treatment for a blood clot. They act quickly. However, they cannot be taken orally.  Heparin can cause a fall in a component of blood that stops bleeding and forms blood clots (platelets). You will be monitored with blood tests to be sure this does not occur.  Warfarin is an anticoagulant that can be swallowed. It takes a few days to start working, so usually heparin or related medicines are used in combination. Once warfarin is working, heparin is usually stopped.  Less commonly, clot dissolving drugs (thrombolytics) are used to dissolve a  DVT. They carry a high risk of bleeding, so they are used mainly in severe cases, where your life or a limb is threatened.  Very rarely, a blood clot in the leg needs to be removed surgically.  If you are unable to take anticoagulants, your health care provider may arrange for you to have a filter placed in a main vein in your abdomen. This filter prevents clots from traveling to your lungs. HOME CARE INSTRUCTIONS  Take all medicines prescribed by your health care provider. Only take over-the-counter or prescription medicines for pain, fever, or discomfort as directed by your health care provider.  Warfarin. Most people will continue taking warfarin after hospital discharge. Your health care provider will advise you on the length of treatment (usually 3-6 months, sometimes lifelong).  Too much and too little warfarin are both dangerous. Too much warfarin increases the risk of bleeding. Too little warfarin continues to allow the risk for blood clots. While taking warfarin, you will need to have regular blood tests to measure your blood clotting time.  These blood tests usually include both the prothrombin time (PT) and international normalized ratio (INR) tests. The PT and INR results allow your health care provider to adjust your dose of warfarin. The dose can change for many reasons. It is critically important that you take warfarin exactly as prescribed, and that you have your PT and INR levels drawn exactly as directed.  Many foods, especially foods high in vitamin K, can interfere with warfarin and affect the PT and INR results. Foods high in vitamin K include spinach, kale, broccoli, cabbage, collard and turnip greens, brussel sprouts, peas, cauliflower, seaweed, and parsley as well as beef and pork liver, green tea, and soybean oil. You should eat a consistent amount of foods high in vitamin K. Avoid major changes in your diet, or notify your health care provider before changing your diet. Arrange  a visit with a dietitian to answer your questions.  Many medicines can interfere with warfarin and affect the PT and INR results. You must tell your health care provider about any and all medicines you take. This includes all vitamins and supplements. Be especially cautious with aspirin and anti-inflammatory medicines. Ask your health care provider before taking these. Do not take or discontinue any prescribed or over-the-counter medicine except on the advice of your health care provider or pharmacist.  Warfarin can have side effects, primarily excessive bruising or bleeding. You will need to hold pressure over cuts for longer than usual. Your health care provider or pharmacist will discuss other potential side effects.  Alcohol can change the body's ability to handle warfarin. It is best to avoid alcoholic drinks or consume only very small amounts while taking warfarin. Notify your health care provider if you change your alcohol intake.  Notify your dentist or other health care providers before procedures.  Activity. Ask your health care provider how soon you can go back to normal activities. It is important to stay active to prevent blood clots. If you are on anticoagulant medicine, avoid contact sports.  Exercise. It is very important to exercise. This is especially important while traveling, sitting, or standing for long periods of time. Exercise your legs by walking or by pumping the muscles frequently. Take frequent walks.  Compression stockings. These are tight elastic stockings that apply pressure to the lower legs. This pressure can help keep the blood in the legs from clotting. You may need to wear compression stockings at home to help prevent a DVT.  Do not smoke. If you smoke, quit. Ask your health care provider for help with quitting smoking.  Learn as much as you can about DVT. Knowing more about the condition should help you keep it from coming back.  Wear a medical alert bracelet  or carry a medical alert card. SEEK MEDICAL CARE IF:  You notice a rapid heartbeat.  You feel weaker or more tired than usual.  You feel faint.  You notice increased bruising.  You feel your symptoms are not getting better in the time expected.  You believe you are having side effects of medicine. SEEK IMMEDIATE MEDICAL CARE IF:  You have chest pain.  You have trouble breathing.  You have new or increased swelling or pain in one leg.  You cough up blood.  You notice blood in vomit, in a bowel movement, or in urine. MAKE SURE YOU:  Understand these instructions.  Will watch your condition.  Will get help right away if you are not doing well or get worse. Document Released: 03/07/2005  Document Revised: 12/26/2012 Document Reviewed: 11/12/2012 [facility] Patient Information 2015 [place], [place]. This information is not intended to replace advice given to you by your health care provider. Make sure you discuss any questions you have with your health care provider.  Cellulitis Cellulitis is an infection of the skin and the tissue beneath it. The infected area is usually red and tender. Cellulitis occurs most often in the arms and lower legs.  CAUSES  Cellulitis is caused by bacteria that enter the skin through cracks or cuts in the skin. The most common types of bacteria that cause cellulitis are Staphylococcus and Streptococcus. SYMPTOMS   Redness and warmth.  Swelling.  Tenderness or pain.  Fever. DIAGNOSIS  Your caregiver can usually determine what is wrong based on a physical exam. Blood tests may also be done. TREATMENT  Treatment usually involves taking an antibiotic medicine. HOME CARE INSTRUCTIONS   Take your antibiotics as directed. Finish them even if you start to feel better.  Keep the infected arm or leg elevated to reduce swelling.  Apply a warm cloth to the affected area up to 4 times per day to relieve pain.  Only take over-the-counter or  prescription medicines for pain, discomfort, or fever as directed by your caregiver.  Keep all follow-up appointments as directed by your caregiver. SEEK MEDICAL CARE IF:   You notice red streaks coming from the infected area.  Your red area gets larger or turns dark in color.  Your bone or joint underneath the infected area becomes painful after the skin has healed.  Your infection returns in the same area or another area.  You notice a swollen bump in the infected area.  You develop new symptoms. SEEK IMMEDIATE MEDICAL CARE IF:   You have a fever.  You feel very sleepy.  You develop vomiting or diarrhea.  You have a general ill feeling (malaise) with muscle aches and pains. MAKE SURE YOU:   Understand these instructions.  Will watch your condition.  Will get help right away if you are not doing well or get worse. Document Released: 12/15/2004 Document Revised: 09/06/2011 Document Reviewed: 05/23/2011 [facility] Patient Information 2015 [place] HollowExitCare, [place]. This information is not intended to replace advice given to you by your health care provider. Make sure you discuss any questions you have with your health care provider.  Cephalexin tablets or capsules What is this medicine? CEPHALEXIN (sef a LEX in) is a cephalosporin antibiotic. It is used to treat certain kinds of bacterial infections It will not work for colds, flu, or other viral infections. This medicine may be used for other purposes; ask your health care provider or pharmacist if you have questions. COMMON BRAND NAME(S): Biocef, Keflex, Keftab What should I tell my health care provider before I take this medicine? They need to know if you have any of these conditions: -kidney disease -stomach or intestine problems, especially colitis -an unusual or allergic reaction to cephalexin, other cephalosporins, penicillins, other antibiotics, medicines, foods, dyes or preservatives -pregnant or trying to get  pregnant -breast-feeding How should I use this medicine? Take this medicine by mouth with a full glass of water. Follow the directions on the prescription label. This medicine can be taken with or without food. Take your medicine at regular intervals. Do not take your medicine more often than directed. Take all of your medicine as directed even if you think you are better. Do not skip doses or stop your medicine early. Talk to your pediatrician regarding the use of  this medicine in children. While this drug may be prescribed for selected conditions, precautions do apply. Overdosage: If you think you have taken too much of this medicine contact a poison control center or emergency room at once. NOTE: This medicine is only for you. Do not share this medicine with others. What if I miss a dose? If you miss a dose, take it as soon as you can. If it is almost time for your next dose, take only that dose. Do not take double or extra doses. There should be at least 4 to 6 hours between doses. What may interact with this medicine? -probenecid -some other antibiotics This list may not describe all possible interactions. Give your health care provider a list of all the medicines, herbs, non-prescription drugs, or dietary supplements you use. Also tell them if you smoke, drink alcohol, or use illegal drugs. Some items may interact with your medicine. What should I watch for while using this medicine? Tell your doctor or health care professional if your symptoms do not begin to improve in a few days. Do not treat diarrhea with over the counter products. Contact your doctor if you have diarrhea that lasts more than 2 days or if it is severe and watery. If you have diabetes, you may get a false-positive result for sugar in your urine. Check with your doctor or health care professional. What side effects may I notice from receiving this medicine? Side effects that you should report to your doctor or health care  professional as soon as possible: -allergic reactions like skin rash, itching or hives, swelling of the face, lips, or tongue -breathing problems -pain or trouble passing urine -redness, blistering, peeling or loosening of the skin, including inside the mouth -severe or watery diarrhea -unusually weak or tired -yellowing of the eyes, skin Side effects that usually do not require medical attention (report to your doctor or health care professional if they continue or are bothersome): -gas or heartburn -genital or anal irritation -headache -joint or muscle pain -nausea, vomiting This list may not describe all possible side effects. Call your doctor for medical advice about side effects. You may report side effects to FDA at 1-800-FDA-1088. Where should I keep my medicine? Keep out of the reach of children. Store at room temperature between 59 and 86 degrees F (15 and 30 degrees C). Throw away any unused medicine after the expiration date. NOTE: This sheet is a summary. It may not cover all possible information. If you have questions about this medicine, talk to your doctor, pharmacist, or health care provider.  2015, [employer]. (2007-06-11 17:09:13)

## 2013-09-15 NOTE — ED Notes (Signed)
Dr. Glick at bedside.  

## 2013-09-15 NOTE — Progress Notes (Signed)
VASCULAR LAB PRELIMINARY  PRELIMINARY  PRELIMINARY  PRELIMINARY  Right lower extremity venous Doppler completed.    Preliminary report:  There is no DVT or SVT noted in the right lower extremity.   KANADY, CANDACE, RVT 09/15/2013, 9:01 AM

## 2013-09-15 NOTE — ED Provider Notes (Signed)
CSN: [id number]     Arrival date & time 09/14/13  2307 History   First MD Initiated Contact with Patient 09/15/13 0121     Chief Complaint  Patient presents with  . Leg Pain     (Consider location/radiation/quality/duration/timing/severity/associated sxs/prior Treatment) Patient is a 80 y.o. female presenting with leg pain. The history is provided by the patient.  Leg Pain She is actually complaining of itching in her right leg, not pain. She has had itching in her right leg for the last 3 days. He is getting progressively worse and now she is also having some itching in her right hand. She noticed redness in her leg. It is not painful, but she had been having pains in both of her feet. She saw her PCP who started her on Lyrica for the foot pain. She denies fever, chills, sweats. She denies chest pain or dyspnea. Nothing makes symptoms better nothing makes them worse.  Past Medical History  Diagnosis Date  . Coronary artery disease     Remote PCI. Had BMS to RCA and DES stent to Diagonal in Jan. 2012   . Diabetes mellitus     long standing  . Diastolic heart failure     EF 55 to 60% per cath in Jan 2012  . Hyperlipidemia   . Hypertension   . Carotid bruit   . Gout   . Obesity   . TIA (transient ischemic attack)   . Chronic kidney disease     Stage 3  . Ischemic cardiomyopathy     with prior EF of 35%  . Anemia   . Proteinuria   . NSTEMI (non-ST elevated myocardial infarction) Jan 2012    with PCI to RCA and DX per Dr. Excell Seltzer  . Carotid artery occlusion   . Stroke 2013    ?  mini    . DVT (deep venous thrombosis)    Past Surgical History  Procedure Laterality Date  . Appendectomy    . Tonsillectomy    . Left ankle fusion    . Distal rca  04/05/2010    stent  . Fracture surgery Right 2005    bilateral ankles    No family history on file. History  Substance Use Topics  . Smoking status: Former Smoker    Quit date: 03/21/1968  . Smokeless tobacco: Never Used  .  Alcohol Use: No   OB History   Grav Para Term Preterm Abortions TAB SAB Ect Mult Living                 Review of Systems  All other systems reviewed and are negative.     Allergies  Penicillins and Sulfa antibiotics  Home Medications   Prior to Admission medications   Medication Sig Start Date End Date Taking? Authorizing Provider  amLODipine (NORVASC) 5 MG tablet Take 1 tablet (5 mg total) by mouth daily. 03/08/13   Rosalio Macadamia, NP  aspirin 81 MG tablet Take 81 mg by mouth daily.      Historical Provider, MD  BROMDAY 0.09 % SOLN Place 1 drop into the left eye daily. 11/22/11   Historical Provider, MD  carvedilol (COREG) 3.125 MG tablet Take one tablet by mouth twice daily for blood pressure 07/08/13   Tiffany L Reed, DO  cetirizine (ZYRTEC) 5 MG tablet TAKE 1 TABLET BY MOUTH ONCE DAILY AT BEDTIME 02/21/13   Tiffany L Reed, DO  cloNIDine (CATAPRES) 0.1 MG tablet TAKE 1 TABLET BY MOUTH EVERY  DAY    Claudie ReveringJessica M Karam, NP  colesevelam [facility](WELCHOL) 625 MG tablet Take 1 tablet (675 mg) twice daily with meals 07/08/13   Tiffany L Reed, DO  EASY TOUCH PEN NEEDLES 32G X 6 MM MISC USE 1 NEEDLE AT BEDTIME 01/17/13   Mahima Pandey, MD  fluticasone (FLONASE) 50 MCG/ACT nasal spray USE 2 SPRAYS IN EACH NOSTRIL DAILY 04/17/13   Oneal GroutMahima Pandey, MD  losartan (COZAAR) 50 MG tablet Take 1 tablet (50 mg total) by mouth daily. 09/12/13   Claudie ReveringJessica M Karam, NP  LOTEMAX 0.5 % ophthalmic suspension Place 1 drop into the left eye 3 (three) times daily.  09/30/11   Historical Provider, MD  nitroGLYCERIN (NITROSTAT) 0.4 MG SL tablet Dissolve one tablet under tongue as needed for chest pain. May repeat as needed every 5 minutes up to 3 doses. Seek medical attention if pain is not relieved after 3 doses 08/07/13   Kimber RelicArthur G Green, MD  omega-3 acid ethyl esters (LOVAZA) 1 G capsule TAKE 2 CAPSULES BY MOUTH EVERY NIGHT AT BEDTIME FOR 30 DAYS 08/07/13   Claudie ReveringJessica M Karam, NP  Resolute HealthNETOUCH DELICA LANCETS 33G MISC USE TO TEST BLOOD  SUGAR DAILY AS DIRECTED 08/07/13   Claudie ReveringJessica M Karam, NP  PRODIGY TWIST TOP LANCETS 28G MISC CHECK BLOOD SUGAR DAILY 01/17/13   Tiffany Alroy DustL Reed, DO  trimethoprim-polymyxin b (POLYTRIM) ophthalmic solution  04/08/13   Historical Provider, MD  VYTORIN 10-20 MG per tablet TAKE 1/2 TABLET BY MOUTH EVERY NIGHT AT BEDTIME FOR 30 DAYS 08/07/13   Claudie ReveringJessica M Karam, NP   BP 169/48  Pulse 70  Temp(Src) 98 F (36.7 C)  Resp 20  Ht 5' (1.524 m)  Wt 147 lb (66.679 kg)  BMI 28.71 kg/m2  SpO2 99% Physical Exam  Nursing note and vitals reviewed.  80 year old female, resting comfortably and in no acute distress. Vital signs are significant for hypertension with blood pressure 169/48. Oxygen saturation is 99%, which is normal. Head is normocephalic and atraumatic. PERRLA, EOMI. Oropharynx is clear. Neck is nontender and supple without adenopathy or JVD. Back is nontender and there is no CVA tenderness. Lungs are clear without rales, wheezes, or rhonchi. Chest is nontender. Heart has regular rate and rhythm without murmur. Abdomen is soft, flat, nontender without masses or hepatosplenomegaly and peristalsis is normoactive. Extremities: Right leg has moderate erythema to just proximal to the knee. Her slight warmth in this area. There is 1+ edema on the left and 2+ edema on the right. Right calf and right thigh circumference is about 1-2 cm greater than left calf and thigh circumference. There is mild tenderness palpation in the left calf. There no cords palpable. At Rare refill is prompt and sensation is normal. No definite abnormality is seen in the right hand. Overall picture seems most consistent with cellulitis to. Skin is warm and dry without rash. Neurologic: Mental status is normal, cranial nerves are intact, there are no motor or sensory deficits.  ED Course  Procedures (including critical care time) Labs Review Results for orders placed during the hospital encounter of 09/15/13  CBC WITH DIFFERENTIAL       Result Value Ref Range   WBC 3.8 (*) 4.0 - 10.5 K/uL   RBC 3.45 (*) 3.87 - 5.11 MIL/uL   Hemoglobin 10.6 (*) 12.0 - 15.0 g/dL   HCT 16.132.5 (*) 09.636.0 - 04.546.0 %   MCV 94.2  78.0 - 100.0 fL   MCH 30.7  26.0 - 34.0 pg  MCHC 32.6  30.0 - 36.0 g/dL   RDW 16.113.7  09.611.5 - 04.515.5 %   Platelets 139 (*) 150 - 400 K/uL   Neutrophils Relative % 56  43 - 77 %   Neutro Abs 2.1  1.7 - 7.7 K/uL   Lymphocytes Relative 32  12 - 46 %   Lymphs Abs 1.2  0.7 - 4.0 K/uL   Monocytes Relative 8  3 - 12 %   Monocytes Absolute 0.3  0.1 - 1.0 K/uL   Eosinophils Relative 4  0 - 5 %   Eosinophils Absolute 0.2  0.0 - 0.7 K/uL   Basophils Relative 0  0 - 1 %   Basophils Absolute 0.0  0.0 - 0.1 K/uL  BASIC METABOLIC PANEL      Result Value Ref Range   Sodium 143  137 - 147 mEq/L   Potassium 4.6  3.7 - 5.3 mEq/L   Chloride 107  96 - 112 mEq/L   CO2 24  19 - 32 mEq/L   Glucose, Bld 140 (*) 70 - 99 mg/dL   BUN 35 (*) 6 - 23 mg/dL   Creatinine, Ser 4.091.30 (*) 0.50 - 1.10 mg/dL   Calcium 8.7  8.4 - 81.110.5 mg/dL   GFR calc non Af Amer 37 (*) >90 mL/min   GFR calc Af Amer 42 (*) >90 mL/min  SEDIMENTATION RATE      Result Value Ref Range   Sed Rate 14  0 - 22 mm/hr   MDM   Final diagnoses:  Right leg swelling  Renal insufficiency    Cellulitis of the right leg. She is given a dose of cefazolin and screening labs obtained.  WBC is actually slightly low and without any left shift. Hemoglobin is mildly decreased, but stable compared with baseline. Mild renal insufficiency is also stable compared with baseline. Sedimentation rate has come back normal. This is suggestive that her symptoms may not be from cellulitis and could be from DVT. She's given a dose of enoxaparin and will be brought back later today for a venous Doppler to rule out DVT. She is given a prescription for cephalexin which he is to fill only if her venous Doppler comes back negative.  Dione Boozeavid Glick, MD 09/15/13 [phone]

## 2013-09-15 NOTE — ED Notes (Signed)
Pt wheel chaired to car. NAD at this time.

## 2013-09-17 ENCOUNTER — Encounter: Payer: Self-pay | Admitting: Family

## 2013-09-17 ENCOUNTER — Telehealth: Payer: Self-pay | Admitting: *Deleted

## 2013-09-17 NOTE — Telephone Encounter (Signed)
Patient daughter, Arna Mediciora, called and stated that patient is having an allergic reaction to Lyrica. Her right arm and right leg has a rash and swelling. There is redness on leg that is moving up. Patient went to the ER on the 28th and they ruled out DVT and gave antibiotic because they stated she had cellulitis. Daughter doesn't think so, she thinks its a reaction to the Lyrica and wants to stop it a couple days to see if it gets better because its only keeps getting worse. Please Advise.

## 2013-09-17 NOTE — Telephone Encounter (Signed)
Patient daughter Notified and scheduled an appointment to follow up next week.

## 2013-09-17 NOTE — Telephone Encounter (Signed)
Yes please stop medication and follow up next week

## 2013-09-18 ENCOUNTER — Ambulatory Visit (HOSPITAL_COMMUNITY)
Admission: RE | Admit: 2013-09-18 | Discharge: 2013-09-18 | Disposition: A | Discharge status: Home / Self Care | Payer: PRIVATE HEALTH INSURANCE | Source: Ambulatory Visit | Attending: Family | Admitting: Family

## 2013-09-18 ENCOUNTER — Ambulatory Visit: Payer: PRIVATE HEALTH INSURANCE | Admitting: Family

## 2013-09-18 DIAGNOSIS — I739 Peripheral vascular disease, unspecified: Secondary | ICD-10-CM

## 2013-09-18 DIAGNOSIS — I70219 Atherosclerosis of native arteries of extremities with intermittent claudication, unspecified extremity: Secondary | ICD-10-CM

## 2013-09-18 DIAGNOSIS — M79609 Pain in unspecified limb: Secondary | ICD-10-CM

## 2013-09-19 ENCOUNTER — Encounter: Payer: Self-pay | Admitting: Nurse Practitioner

## 2013-09-19 ENCOUNTER — Ambulatory Visit (INDEPENDENT_AMBULATORY_CARE_PROVIDER_SITE_OTHER): Payer: PRIVATE HEALTH INSURANCE | Admitting: Nurse Practitioner

## 2013-09-19 ENCOUNTER — Telehealth: Payer: Self-pay

## 2013-09-19 VITALS — BP 180/80 | HR 78 | Temp 98.3°F | Resp 18 | Ht 60.0 in | Wt 148.4 lb

## 2013-09-19 DIAGNOSIS — I10 Essential (primary) hypertension: Secondary | ICD-10-CM

## 2013-09-19 DIAGNOSIS — L02419 Cutaneous abscess of limb, unspecified: Secondary | ICD-10-CM

## 2013-09-19 DIAGNOSIS — L03119 Cellulitis of unspecified part of limb: Secondary | ICD-10-CM

## 2013-09-19 DIAGNOSIS — L03115 Cellulitis of right lower limb: Secondary | ICD-10-CM

## 2013-09-19 MED ORDER — DOXYCYCLINE HYCLATE 100 MG PO TABS: 100.0000 mg | ORAL_TABLET | Freq: Two times a day (BID) | ORAL | Status: DC

## 2013-09-19 MED ORDER — POTASSIUM CHLORIDE CRYS ER 20 MEQ PO TBCR: 20.0000 meq | EXTENDED_RELEASE_TABLET | Freq: Every day | ORAL | Status: DC

## 2013-09-19 MED ORDER — FUROSEMIDE 40 MG PO TABS: 40.0000 mg | ORAL_TABLET | Freq: Every day | ORAL | Status: DC

## 2013-09-19 NOTE — Progress Notes (Signed)
Patient ID: [name], female   DOB: Mar 02, 1928, [age] y.o.   MRN: [id number]    Allergies  Allergen Reactions  . Penicillins Hives  . Sulfa Antibiotics Other (See Comments)    Tongue swells    Chief Complaint  Patient presents with  . Acute Visit    swelling in both legs since Saturday    HPI: Patient is a 80 y.o. female seen in the office today for follow up on lower extremity redness and swelling, went to ED on 6.28.15 after swelling to right leg was becoming worse, doppler was negative and was diagnosed with cellulitis now being treating her with Keflex 500 mg QID; Redness and swelling has not improved and is worse on left leg; Gaining a lb every time she has been on a scale, no shortness of breath, no fevers or chills. Blisters noted to right ankle had improved with ice.   Review of Systems:  Review of Systems  Constitutional: Negative for fever, chills, weight loss, malaise/fatigue and diaphoresis.  Respiratory: Negative for cough, hemoptysis, sputum production, shortness of breath and wheezing.   Cardiovascular: Positive for leg swelling (worse). Negative for chest pain and palpitations.  Gastrointestinal: Negative for abdominal pain, diarrhea and constipation.  Genitourinary: Negative for dysuria and urgency.  Musculoskeletal: Positive for myalgias (in bilateral legs).  Skin: Positive for itching (to legs).  Neurological: Negative for weakness and headaches.     Past Medical History  Diagnosis Date  . Coronary artery disease     Remote PCI. Had BMS to RCA and DES stent to Diagonal in Jan. 2012   . Diabetes mellitus     long standing  . Diastolic heart failure     EF 55 to 60% per cath in Jan 2012  . Hyperlipidemia   . Hypertension   . Carotid bruit   . Gout   . Obesity   . TIA (transient ischemic attack)   . Chronic kidney disease     Stage 3  . Ischemic cardiomyopathy     with prior EF of 35%  . Anemia   . Proteinuria   . NSTEMI (non-ST elevated myocardial  infarction) Jan 2012    with PCI to RCA and DX per Dr. Excell Seltzerooper  . Carotid artery occlusion   . Stroke 2013    ?  mini    . DVT (deep venous thrombosis)    Past Surgical History  Procedure Laterality Date  . Appendectomy    . Tonsillectomy    . Left ankle fusion    . Distal rca  04/05/2010    stent  . Fracture surgery Right 2005    bilateral ankles    Social History:   reports that she quit smoking about 45 years ago. She has never used smokeless tobacco. She reports that she does not drink alcohol or use illicit drugs.  No family history on file.  Medications: Patient's Medications  New Prescriptions   No medications on file  Previous Medications   AMLODIPINE (NORVASC) 5 MG TABLET    Take 5 mg by mouth daily.   ASPIRIN 81 MG TABLET    Take 81 mg by mouth daily.     BROMDAY 0.09 % SOLN    Place 1 drop into the left eye daily.   CARVEDILOL (COREG) 3.125 MG TABLET    Take 3.125 mg by mouth 2 (two) times daily with a meal.   CEPHALEXIN (KEFLEX) 500 MG CAPSULE    Take 1 capsule (500 mg total)  by mouth 4 (four) times daily.   CETIRIZINE (ZYRTEC) 5 MG TABLET    Take 5 mg by mouth daily.   CLONIDINE (CATAPRES) 0.1 MG TABLET    Take 0.1 mg by mouth daily.   COLESEVELAM (WELCHOL) 625 MG TABLET    Take 625 mg by mouth 2 (two) times daily with a meal.   EZETIMIBE-SIMVASTATIN (VYTORIN) 10-20 MG PER TABLET    Take 0.5 tablets by mouth at bedtime.   FLUTICASONE (FLONASE) 50 MCG/ACT NASAL SPRAY    Place 2 sprays into both nostrils daily.   LOSARTAN (COZAAR) 50 MG TABLET    Take 50 mg by mouth daily.   LOTEMAX 0.5 % OPHTHALMIC SUSPENSION    Place 1 drop into the left eye 3 (three) times daily.    NITROGLYCERIN (NITROSTAT) 0.4 MG SL TABLET    Dissolve one tablet under tongue as needed for chest pain. May repeat as needed every 5 minutes up to 3 doses. Seek medical attention if pain is not relieved after 3 doses   OMEGA-3 ACID ETHYL ESTERS (LOVAZA) 1 G CAPSULE    TAKE 2 CAPSULES BY MOUTH EVERY  NIGHT AT BEDTIME FOR 30 DAYS  Modified Medications   No medications on file  Discontinued Medications   No medications on file     Physical Exam:  Filed Vitals:   09/19/13 1619  BP: 180/80  Pulse: 78  Temp: 98.3 F (36.8 C)  TempSrc: Oral  Resp: 18  Height: 5' (1.524 m)  Weight: 148 lb 6.4 oz (67.314 kg)  SpO2: 93%    Physical Exam  Constitutional: She is well-developed, well-nourished, and in no distress.  HENT:  Mouth/Throat: Oropharynx is clear and moist. No oropharyngeal exudate.  Eyes: Conjunctivae and EOM are normal. Pupils are equal, round, and reactive to light.  Neck: Normal range of motion. Neck supple. No thyromegaly present.  Cardiovascular: Normal rate, regular rhythm and normal heart sounds.   Pulmonary/Chest: Effort normal and breath sounds normal.  Abdominal: Soft. Bowel sounds are normal. She exhibits no distension.  Musculoskeletal: She exhibits edema and tenderness.  2+ pitting edema with redness on right below the knee, 1+ on left with petechiae rash to lower leg (below knee)  Neurological: She is alert.  Skin: Skin is warm and dry.  Closed small blisters to medial ankle on right  Psychiatric: Affect normal.     Labs reviewed: Basic Metabolic Panel:  Recent Labs  16/10/96 0959 07/29/13 0900 09/15/13 0138  NA 143 144 143  K 4.7 4.8 4.6  CL 112 108 107  CO2 26 23 24   GLUCOSE 130* 128* 140*  BUN 22 31* 35*  CREATININE 1.2 1.35* 1.30*  CALCIUM 8.5 8.7 8.7   Liver Function Tests:  Recent Labs  01/30/13 1215 02/25/13 1206 07/29/13 0900  AST 16 20 15   ALT 8 11 8   ALKPHOS 57 46 41  BILITOT 0.4 0.6 <0.2  PROT 5.6* 6.2 5.3*  ALBUMIN  --  3.6  --    No results found for this basename: LIPASE, AMYLASE,  in the last 8760 hours No results found for this basename: AMMONIA,  in the last 8760 hours CBC:  Recent Labs  02/25/13 1206 09/15/13 0138  WBC 4.5 3.8*  NEUTROABS 2.0 2.1  HGB 12.0 10.6*  HCT 36.0 32.5*  MCV 91.1 94.2  PLT  144.0* 139*   Lipid Panel:  Recent Labs  01/30/13 1215 02/25/13 1206  CHOL  --  157  HDL 58 49.90  LDLCALC  52 79  TRIG 80 140.0  CHOLHDL 2.2 3   TSH: No results found for this basename: TSH,  in the last 8760 hours A1C: Lab Results  Component Value Date   HGBA1C 7.1* 07/29/2013     Assessment/Plan  1. Cellulitis of leg, right -unchanged since ED, now increased edema on left, will change antibiotic, stop keflex and start doxycyline  -to elevate legs   - doxycycline (VIBRA-TABS) 100 MG tablet; Take 1 tablet (100 mg total) by mouth 2 (two) times daily.  Dispense: 20 tablet; Refill: 0 - furosemide (LASIX) 40 MG tablet; Take 1 tablet (40 mg total) by mouth daily.  Dispense: 30 tablet; Refill: 3 - potassium chloride SA (K-DUR,KLOR-CON) 20 MEQ tablet; Take 1 tablet (20 mEq total) by mouth daily.  Dispense: 30 tablet; Refill: 3 -return precautions given -to keep follow up for 1 week  2. Hypertension Elevated today -lasix 40 mg order will improve this -restrict sodium

## 2013-09-19 NOTE — Telephone Encounter (Signed)
Pt has appt next week if she feels like she needs to be evaluated before this time to go to an urgent or can be scheduled for this afternoon if there is an appt available

## 2013-09-19 NOTE — Telephone Encounter (Signed)
Patient discontinued Lyrica as advised on 09/17/2013. Patient  had dime size blisters on 09/18/13 (which have now decreased in size) on right lower leg and mild pain. Patient's daughter is very concerned  Pharmacy [facility] [address] if anything short term to be prescribed. Shanda BumpsJessica please advise

## 2013-09-19 NOTE — Telephone Encounter (Signed)
Spoke with Arna Mediciora (patient's daughter). Patient to be seen in office at 4:15 pm

## 2013-09-19 NOTE — Patient Instructions (Signed)
ELEVATE LEG No salt Lasix 40 mg daily and potassium 20 meq daily for swelling Doxycycline for cellulitis twice daily for 10 days

## 2013-09-26 ENCOUNTER — Encounter: Payer: Self-pay | Admitting: Nurse Practitioner

## 2013-09-26 ENCOUNTER — Ambulatory Visit (INDEPENDENT_AMBULATORY_CARE_PROVIDER_SITE_OTHER): Payer: PRIVATE HEALTH INSURANCE | Admitting: Nurse Practitioner

## 2013-09-26 VITALS — BP 158/74 | HR 76 | Temp 98.2°F | Wt 139.6 lb

## 2013-09-26 DIAGNOSIS — R21 Rash and other nonspecific skin eruption: Secondary | ICD-10-CM

## 2013-09-26 DIAGNOSIS — D649 Anemia, unspecified: Secondary | ICD-10-CM

## 2013-09-26 DIAGNOSIS — Z23 Encounter for immunization: Secondary | ICD-10-CM

## 2013-09-26 DIAGNOSIS — R609 Edema, unspecified: Secondary | ICD-10-CM

## 2013-09-26 MED ORDER — TETANUS-DIPHTH-ACELL PERTUSSIS 5-2.5-18.5 LF-MCG/0.5 IM SUSP: 0.5000 mL | Freq: Once | INTRAMUSCULAR | Status: DC

## 2013-09-26 MED ORDER — CLOTRIMAZOLE-BETAMETHASONE 1-0.05 % EX CREA: 1.0000 "application " | TOPICAL_CREAM | Freq: Two times a day (BID) | CUTANEOUS | Status: DC

## 2013-09-26 NOTE — Patient Instructions (Signed)
New prescription for cream to apply twice daily to affected area, will get blood work today

## 2013-09-26 NOTE — Progress Notes (Signed)
Patient ID: [name], female   DOB: 1927/08/03, 80 y.o.   MRN: [id number]    Allergies  Allergen Reactions  . Penicillins Hives  . Sulfa Antibiotics Other (See Comments)    Tongue swells    Chief Complaint  Patient presents with  . Hospitalization Follow-up    f/u from ER visit 3 weeks ago  . other    itching on legs/hands with a rash    HPI: Patient is a 80 y.o. female seen in the office today for follow up on cellulitis and swelling Has been taking doxycyline and lasix, swelling and redness has improved in lower legs but now having increased redness on right knee and new Itching and pain started last night to right lower leg. Daughter reports itching has been off and on.   Review of Systems:  Review of Systems  Constitutional: Negative for fever, chills, weight loss, malaise/fatigue and diaphoresis.  Respiratory: Negative for cough and shortness of breath.   Cardiovascular: Positive for leg swelling (imrpoved but still there). Negative for chest pain.  Gastrointestinal: Negative for abdominal pain, diarrhea and constipation.  Genitourinary: Negative for dysuria and urgency.  Musculoskeletal: Positive for myalgias (in bilateral legs).  Skin: Positive for itching (to legs).  Neurological: Negative for tingling, weakness and headaches.     Past Medical History  Diagnosis Date  . Coronary artery disease     Remote PCI. Had BMS to RCA and DES stent to Diagonal in Jan. 2012   . Diabetes mellitus     long standing  . Diastolic heart failure     EF 55 to 60% per cath in Jan 2012  . Hyperlipidemia   . Hypertension   . Carotid bruit   . Gout   . Obesity   . TIA (transient ischemic attack)   . Chronic kidney disease     Stage 3  . Ischemic cardiomyopathy     with prior EF of 35%  . Anemia   . Proteinuria   . NSTEMI (non-ST elevated myocardial infarction) Jan 2012    with PCI to RCA and DX per Dr. Excell Seltzerooper  . Carotid artery occlusion   . Stroke 2013    ?  mini    .  DVT (deep venous thrombosis)    Past Surgical History  Procedure Laterality Date  . Appendectomy    . Tonsillectomy    . Left ankle fusion    . Distal rca  04/05/2010    stent  . Fracture surgery Right 2005    bilateral ankles    Social History:   reports that she quit smoking about 45 years ago. She has never used smokeless tobacco. She reports that she does not drink alcohol or use illicit drugs.  History reviewed. No pertinent family history.  Medications: Patient's Medications  New Prescriptions   No medications on file  Previous Medications   AMLODIPINE (NORVASC) 5 MG TABLET    Take 5 mg by mouth daily.   ASPIRIN 81 MG TABLET    Take 81 mg by mouth daily.     BROMDAY 0.09 % SOLN    Place 1 drop into the left eye daily.   CARVEDILOL (COREG) 3.125 MG TABLET    Take 3.125 mg by mouth 2 (two) times daily with a meal.   CEPHALEXIN (KEFLEX) 500 MG CAPSULE    Take 1 capsule (500 mg total) by mouth 4 (four) times daily.   CETIRIZINE (ZYRTEC) 5 MG TABLET    Take 5  mg by mouth daily.   CLONIDINE (CATAPRES) 0.1 MG TABLET    Take 0.1 mg by mouth daily.   COLESEVELAM (WELCHOL) 625 MG TABLET    Take 625 mg by mouth 2 (two) times daily with a meal.   DOXYCYCLINE (VIBRA-TABS) 100 MG TABLET    Take 1 tablet (100 mg total) by mouth 2 (two) times daily.   EZETIMIBE-SIMVASTATIN (VYTORIN) 10-20 MG PER TABLET    Take 0.5 tablets by mouth at bedtime.   FLUTICASONE (FLONASE) 50 MCG/ACT NASAL SPRAY    Place 2 sprays into both nostrils daily.   FUROSEMIDE (LASIX) 40 MG TABLET    Take 1 tablet (40 mg total) by mouth daily.   LOSARTAN (COZAAR) 50 MG TABLET    Take 50 mg by mouth daily.   LOTEMAX 0.5 % OPHTHALMIC SUSPENSION    Place 1 drop into the left eye 3 (three) times daily.    NITROGLYCERIN (NITROSTAT) 0.4 MG SL TABLET    Dissolve one tablet under tongue as needed for chest pain. May repeat as needed every 5 minutes up to 3 doses. Seek medical attention if pain is not relieved after 3 doses    OMEGA-3 ACID ETHYL ESTERS (LOVAZA) 1 G CAPSULE    TAKE 2 CAPSULES BY MOUTH EVERY NIGHT AT BEDTIME FOR 30 DAYS   POTASSIUM CHLORIDE SA (K-DUR,KLOR-CON) 20 MEQ TABLET    Take 1 tablet (20 mEq total) by mouth daily.  Modified Medications   Modified Medication Previous Medication   TDAP (BOOSTRIX) 5-2.5-18.5 LF-MCG/0.5 INJECTION Tdap (BOOSTRIX) 5-2.5-18.5 LF-MCG/0.5 injection      Inject 0.5 mLs into the muscle once.    Inject 0.5 mLs into the muscle once.  Discontinued Medications   No medications on file     Physical Exam:  Filed Vitals:   09/26/13 1312  BP: 158/74  Pulse: 76  Temp: 98.2 F (36.8 C)  TempSrc: Oral  Weight: 139 lb 9.6 oz (63.322 kg)  SpO2: 98%    Physical Exam  Constitutional: She is well-developed, well-nourished, and in no distress.  HENT:  Mouth/Throat: Oropharynx is clear and moist. No oropharyngeal exudate.  Eyes: Conjunctivae and EOM are normal. Pupils are equal, round, and reactive to light.  Neck: Normal range of motion. Neck supple. No thyromegaly present.  Cardiovascular: Normal rate, regular rhythm and normal heart sounds.   Pulmonary/Chest: Effort normal and breath sounds normal.  Abdominal: Soft. Bowel sounds are normal. She exhibits no distension.  Musculoskeletal: She exhibits edema (+1 bilaterally. worse to right knee) and tenderness.  Neurological: She is alert.  Skin: Skin is warm and dry.  Beefy red rash to posterior knee, patches of red area on right leg and thigh, very itchy per pt  Psychiatric: Affect normal.     Labs reviewed: Basic Metabolic Panel:  Recent Labs  16/10/96 0959 07/29/13 0900 09/15/13 0138  NA 143 144 143  K 4.7 4.8 4.6  CL 112 108 107  CO2 26 23 24   GLUCOSE 130* 128* 140*  BUN 22 31* 35*  CREATININE 1.2 1.35* 1.30*  CALCIUM 8.5 8.7 8.7   Liver Function Tests:  Recent Labs  01/30/13 1215 02/25/13 1206 07/29/13 0900  AST 16 20 15   ALT 8 11 8   ALKPHOS 57 46 41  BILITOT 0.4 0.6 <0.2  PROT 5.6* 6.2  5.3*  ALBUMIN  --  3.6  --    No results found for this basename: LIPASE, AMYLASE,  in the last 8760 hours No results found for this  basename: AMMONIA,  in the last 8760 hours CBC:  Recent Labs  02/25/13 1206 09/15/13 0138  WBC 4.5 3.8*  NEUTROABS 2.0 2.1  HGB 12.0 10.6*  HCT 36.0 32.5*  MCV 91.1 94.2  PLT 144.0* 139*   Lipid Panel:  Recent Labs  01/30/13 1215 02/25/13 1206  CHOL  --  157  HDL 58 49.90  LDLCALC 52 79  TRIG 80 140.0  CHOLHDL 2.2 3   TSH: No results found for this basename: TSH,  in the last 8760 hours A1C: Lab Results  Component Value Date   HGBA1C 7.1* 07/29/2013     Assessment/Plan    1. Rash -appears to be a fungal rash at this time, will treat with Lotrisone cream twice daily - clotrimazole-betamethasone (LOTRISONE) cream; Apply 1 application topically 2 (two) times daily.  Dispense: 30 g; Refill: 0 -to follow up in 2 weeks to revaluate   2. Anemia, unspecified anemia type -hgb was 10.6 last check, will follow up  - CBC With differential/Platelet  3. Edema Has improved but still with swelling, will cont lasix at this time - Basic metabolic panel

## 2013-09-27 LAB — CBC WITH DIFFERENTIAL
Basophils Absolute: 0 10*3/uL (ref 0.0–0.2)
Basos: 0 %
Eos: 3 %
Eosinophils Absolute: 0.2 10*3/uL (ref 0.0–0.4)
HEMATOCRIT: 36.6 % (ref 34.0–46.6)
Hemoglobin: 12.3 g/dL (ref 11.1–15.9)
IMMATURE GRANS (ABS): 0 10*3/uL (ref 0.0–0.1)
IMMATURE GRANULOCYTES: 0 %
LYMPHS: 30 %
Lymphocytes Absolute: 1.7 10*3/uL (ref 0.7–3.1)
MCH: 30.6 pg (ref 26.6–33.0)
MCHC: 33.6 g/dL (ref 31.5–35.7)
MCV: 91 fL (ref 79–97)
MONOCYTES: 5 %
Monocytes Absolute: 0.3 10*3/uL (ref 0.1–0.9)
NEUTROS PCT: 62 %
Neutrophils Absolute: 3.5 10*3/uL (ref 1.4–7.0)
Platelets: 243 10*3/uL (ref 150–379)
RBC: 4.02 x10E6/uL (ref 3.77–5.28)
RDW: 13.7 % (ref 12.3–15.4)
WBC: 5.7 10*3/uL (ref 3.4–10.8)

## 2013-09-27 LAB — BASIC METABOLIC PANEL
BUN/Creatinine Ratio: 29 — ABNORMAL HIGH (ref 11–26)
BUN: 39 mg/dL — ABNORMAL HIGH (ref 8–27)
CALCIUM: 9.8 mg/dL (ref 8.7–10.3)
CO2: 26 mmol/L (ref 18–29)
Chloride: 105 mmol/L (ref 97–108)
Creatinine, Ser: 1.36 mg/dL — ABNORMAL HIGH (ref 0.57–1.00)
GFR calc Af Amer: 41 mL/min/{1.73_m2} — ABNORMAL LOW (ref >59)
GFR, EST NON AFRICAN AMERICAN: 36 mL/min/{1.73_m2} — AB (ref >59)
GLUCOSE: 112 mg/dL — AB (ref 65–99)
POTASSIUM: 4.8 mmol/L (ref 3.5–5.2)
Sodium: 146 mmol/L — ABNORMAL HIGH (ref 134–144)

## 2013-09-30 ENCOUNTER — Encounter (INDEPENDENT_AMBULATORY_CARE_PROVIDER_SITE_OTHER): Payer: PRIVATE HEALTH INSURANCE | Admitting: Ophthalmology

## 2013-10-03 ENCOUNTER — Other Ambulatory Visit: Payer: Self-pay | Admitting: Nurse Practitioner

## 2013-10-04 NOTE — Telephone Encounter (Signed)
Spoke with patient, patient states she is no longer taking this medication

## 2013-10-07 ENCOUNTER — Encounter (INDEPENDENT_AMBULATORY_CARE_PROVIDER_SITE_OTHER): Payer: PRIVATE HEALTH INSURANCE | Admitting: Ophthalmology

## 2013-10-07 DIAGNOSIS — E1165 Type 2 diabetes mellitus with hyperglycemia: Secondary | ICD-10-CM

## 2013-10-07 DIAGNOSIS — H35039 Hypertensive retinopathy, unspecified eye: Secondary | ICD-10-CM

## 2013-10-07 DIAGNOSIS — H43819 Vitreous degeneration, unspecified eye: Secondary | ICD-10-CM

## 2013-10-07 DIAGNOSIS — I10 Essential (primary) hypertension: Secondary | ICD-10-CM

## 2013-10-07 DIAGNOSIS — E11319 Type 2 diabetes mellitus with unspecified diabetic retinopathy without macular edema: Secondary | ICD-10-CM

## 2013-10-07 DIAGNOSIS — E1139 Type 2 diabetes mellitus with other diabetic ophthalmic complication: Secondary | ICD-10-CM

## 2013-10-07 DIAGNOSIS — E11311 Type 2 diabetes mellitus with unspecified diabetic retinopathy with macular edema: Secondary | ICD-10-CM

## 2013-10-10 ENCOUNTER — Ambulatory Visit (INDEPENDENT_AMBULATORY_CARE_PROVIDER_SITE_OTHER): Payer: PRIVATE HEALTH INSURANCE | Admitting: Nurse Practitioner

## 2013-10-10 VITALS — BP 120/70 | HR 51 | Temp 97.5°F | Resp 18 | Ht 60.0 in | Wt 135.2 lb

## 2013-10-10 DIAGNOSIS — R609 Edema, unspecified: Secondary | ICD-10-CM

## 2013-10-10 DIAGNOSIS — Z23 Encounter for immunization: Secondary | ICD-10-CM

## 2013-10-10 DIAGNOSIS — J309 Allergic rhinitis, unspecified: Secondary | ICD-10-CM

## 2013-10-10 DIAGNOSIS — D649 Anemia, unspecified: Secondary | ICD-10-CM

## 2013-10-10 DIAGNOSIS — R21 Rash and other nonspecific skin eruption: Secondary | ICD-10-CM

## 2013-10-10 MED ORDER — TETANUS-DIPHTH-ACELL PERTUSSIS 5-2.5-18.5 LF-MCG/0.5 IM SUSP: 0.5000 mL | Freq: Once | INTRAMUSCULAR | Status: DC

## 2013-10-10 NOTE — Patient Instructions (Addendum)
Stop taking zyrtec  STOP LASIX AND POTASSIUM CHLORIDE

## 2013-10-10 NOTE — Progress Notes (Signed)
Patient ID: [name], female   DOB: 30-Oct-1927, 80 y.o.   MRN: [id number]    Allergies  Allergen Reactions  . Penicillins Hives  . Sulfa Antibiotics Other (See Comments)    Tongue swells    Chief Complaint  Patient presents with  . Follow-up    HPI: Patient is a 80 y.o. female seen in the office today for follow up on legs, reports all the rash has gone and swelling has significantly improved.  Reports zyrtec is making her sleepy and flonase does not work she is not taking this  Review of Systems:  Review of Systems  Constitutional: Negative for fever, chills, weight loss, malaise/fatigue and diaphoresis.  Respiratory: Negative for cough and shortness of breath.   Cardiovascular: Negative for chest pain and leg swelling.  Gastrointestinal: Negative for abdominal pain, diarrhea and constipation.  Genitourinary: Negative for dysuria and urgency.  Musculoskeletal: Positive for myalgias (in bilateral legs- conts).  Skin: Negative for itching and rash.  Neurological: Negative for tingling, weakness and headaches.  Endo/Heme/Allergies: Positive for environmental allergies.     Past Medical History  Diagnosis Date  . Coronary artery disease     Remote PCI. Had BMS to RCA and DES stent to Diagonal in Jan. 2012   . Diabetes mellitus     long standing  . Diastolic heart failure     EF 55 to 60% per cath in Jan 2012  . Hyperlipidemia   . Hypertension   . Carotid bruit   . Gout   . Obesity   . TIA (transient ischemic attack)   . Chronic kidney disease     Stage 3  . Ischemic cardiomyopathy     with prior EF of 35%  . Anemia   . Proteinuria   . NSTEMI (non-ST elevated myocardial infarction) Jan 2012    with PCI to RCA and DX per Dr. Excell Seltzer  . Carotid artery occlusion   . Stroke 2013    ?  mini    . DVT (deep venous thrombosis)    Past Surgical History  Procedure Laterality Date  . Appendectomy    . Tonsillectomy    . Left ankle fusion    . Distal rca   04/05/2010    stent  . Fracture surgery Right 2005    bilateral ankles    Social History:   reports that she quit smoking about 45 years ago. She has never used smokeless tobacco. She reports that she does not drink alcohol or use illicit drugs.  No family history on file.  Medications: Patient's Medications  New Prescriptions   No medications on file  Previous Medications   AMLODIPINE (NORVASC) 5 MG TABLET    Take 5 mg by mouth daily. For blood pressure   ASPIRIN 81 MG TABLET    Take 81 mg by mouth daily. For heart health   BROMDAY 0.09 % SOLN    Place 1 drop into the left eye daily.   CARVEDILOL (COREG) 3.125 MG TABLET    Take 3.125 mg by mouth 2 (two) times daily with a meal. For blood pressure   CETIRIZINE (ZYRTEC) 5 MG TABLET    Take 5 mg by mouth daily.   CLONIDINE (CATAPRES) 0.1 MG TABLET    Take 0.1 mg by mouth daily. For blood pressure   CLOTRIMAZOLE-BETAMETHASONE (LOTRISONE) CREAM    Apply 1 application topically 2 (two) times daily.   COLESEVELAM (WELCHOL) 625 MG TABLET    Take 625 mg by mouth  2 (two) times daily with a meal. For cholesterol   EZETIMIBE-SIMVASTATIN (VYTORIN) 10-20 MG PER TABLET    Take 0.5 tablets by mouth at bedtime. For cholesterol   FLUTICASONE (FLONASE) 50 MCG/ACT NASAL SPRAY    Place 2 sprays into both nostrils daily.   FUROSEMIDE (LASIX) 40 MG TABLET    Take 1 tablet (40 mg total) by mouth daily.   LOSARTAN (COZAAR) 50 MG TABLET    Take 50 mg by mouth daily. For high blood pressure   LOTEMAX 0.5 % OPHTHALMIC SUSPENSION    Place 1 drop into the left eye 3 (three) times daily.    NITROGLYCERIN (NITROSTAT) 0.4 MG SL TABLET    Dissolve one tablet under tongue as needed for chest pain. May repeat as needed every 5 minutes up to 3 doses. Seek medical attention if pain is not relieved after 3 doses   TDAP (BOOSTRIX) 5-2.5-18.5 LF-MCG/0.5 INJECTION    Inject 0.5 mLs into the muscle once.  Modified Medications   Modified Medication Previous Medication    OMEGA-3 ACID ETHYL ESTERS (LOVAZA) 1 G CAPSULE omega-3 acid ethyl esters (LOVAZA) 1 G capsule      TAKE 2 CAPSULES BY MOUTH EVERY NIGHT AT BEDTIME FOR 30 DAYS for choleserol    TAKE 2 CAPSULES BY MOUTH EVERY NIGHT AT BEDTIME FOR 30 DAYS  Discontinued Medications   CEPHALEXIN (KEFLEX) 500 MG CAPSULE    Take 1 capsule (500 mg total) by mouth 4 (four) times daily.   DOXYCYCLINE (VIBRA-TABS) 100 MG TABLET    Take 1 tablet (100 mg total) by mouth 2 (two) times daily.   POTASSIUM CHLORIDE SA (K-DUR,KLOR-CON) 20 MEQ TABLET    Take 1 tablet (20 mEq total) by mouth daily.     Physical Exam:  Filed Vitals:   10/10/13 1544  BP: 120/70  Pulse: 51  Temp: 97.5 F (36.4 C)  TempSrc: Oral  Resp: 18  Height: 5' (1.524 m)  Weight: 135 lb 3.2 oz (61.326 kg)  SpO2: 94%    Physical Exam  Constitutional: She is well-developed, well-nourished, and in no distress.  HENT:  Mouth/Throat: Oropharynx is clear and moist. No oropharyngeal exudate.  Eyes: Conjunctivae and EOM are normal. Pupils are equal, round, and reactive to light.  Neck: Normal range of motion. Neck supple. No thyromegaly present.  Cardiovascular: Normal rate, regular rhythm and normal heart sounds.   Pulmonary/Chest: Effort normal and breath sounds normal.  Abdominal: Soft. Bowel sounds are normal. She exhibits no distension.  Musculoskeletal: She exhibits no edema and no tenderness.  Neurological: She is alert.  Skin: Skin is warm and dry.  Psychiatric: Affect normal.     Labs reviewed: Basic Metabolic Panel:  Recent Labs  40/98/1104/02/03 0900 09/15/13 0138 09/26/13 1410  NA 144 143 146*  K 4.8 4.6 4.8  CL 108 107 105  CO2 23 24 26   GLUCOSE 128* 140* 112*  BUN 31* 35* 39*  CREATININE 1.35* 1.30* 1.36*  CALCIUM 8.7 8.7 9.8   Liver Function Tests:  Recent Labs  01/30/13 1215 02/25/13 1206 07/29/13 0900  AST 16 20 15   ALT 8 11 8   ALKPHOS 57 46 41  BILITOT 0.4 0.6 <0.2  PROT 5.6* 6.2 5.3*  ALBUMIN  --  3.6  --     No results found for this basename: LIPASE, AMYLASE,  in the last 8760 hours No results found for this basename: AMMONIA,  in the last 8760 hours CBC:  Recent Labs  02/25/13 1206 09/15/13 0138  09/26/13 1410  WBC 4.5 3.8* 5.7  NEUTROABS 2.0 2.1 3.5  HGB 12.0 10.6* 12.3  HCT 36.0 32.5* 36.6  MCV 91.1 94.2 91  PLT 144.0* 139* 243   Lipid Panel:  Recent Labs  01/30/13 1215 02/25/13 1206  CHOL  --  157  HDL 58 49.90  LDLCALC 52 79  TRIG 80 140.0  CHOLHDL 2.2 3   TSH: No results found for this basename: TSH,  in the last 8760 hours A1C: Lab Results  Component Value Date   HGBA1C 7.1* 07/29/2013     Assessment/Plan 1. Need for prophylactic vaccination with combined diphtheria-tetanus-pertussis (DTP) vaccine - Tdap (BOOSTRIX) 5-2.5-18.5 LF-MCG/0.5 injection; Inject 0.5 mLs into the muscle once.  Dispense: 0.5 mL; Refill: 0  2. Allergic rhinitis, unspecified allergic rhinitis type Feels has if zyrtec is making her very sleepy and does not help rhinitis -will stop this at this time -also reports Flonase makes rhinitis worse, to stop this as well and will cont to monitor   3. Rash Resolved  4. Edema Improved, will stop lasix at this time and evaluate need for lasix if this resumes   5. Anemia, unspecified anemia type -stable

## 2013-10-24 ENCOUNTER — Encounter (HOSPITAL_COMMUNITY): Payer: PRIVATE HEALTH INSURANCE

## 2013-10-24 ENCOUNTER — Ambulatory Visit: Payer: PRIVATE HEALTH INSURANCE | Admitting: Vascular Surgery

## 2013-10-24 ENCOUNTER — Other Ambulatory Visit (HOSPITAL_COMMUNITY): Payer: PRIVATE HEALTH INSURANCE

## 2013-10-31 ENCOUNTER — Other Ambulatory Visit: Payer: Self-pay | Admitting: Nurse Practitioner

## 2013-10-31 ENCOUNTER — Ambulatory Visit (INDEPENDENT_AMBULATORY_CARE_PROVIDER_SITE_OTHER): Payer: PRIVATE HEALTH INSURANCE | Admitting: Nurse Practitioner

## 2013-10-31 ENCOUNTER — Encounter: Payer: Self-pay | Admitting: Nurse Practitioner

## 2013-10-31 VITALS — BP 156/82 | HR 62 | Temp 98.1°F | Resp 14 | Ht 60.0 in | Wt 140.4 lb

## 2013-10-31 DIAGNOSIS — E785 Hyperlipidemia, unspecified: Secondary | ICD-10-CM

## 2013-10-31 DIAGNOSIS — R059 Cough, unspecified: Secondary | ICD-10-CM

## 2013-10-31 DIAGNOSIS — R05 Cough: Secondary | ICD-10-CM

## 2013-10-31 DIAGNOSIS — E1151 Type 2 diabetes mellitus with diabetic peripheral angiopathy without gangrene: Secondary | ICD-10-CM

## 2013-10-31 DIAGNOSIS — I798 Other disorders of arteries, arterioles and capillaries in diseases classified elsewhere: Secondary | ICD-10-CM

## 2013-10-31 DIAGNOSIS — E1159 Type 2 diabetes mellitus with other circulatory complications: Secondary | ICD-10-CM

## 2013-10-31 DIAGNOSIS — N189 Chronic kidney disease, unspecified: Secondary | ICD-10-CM

## 2013-10-31 DIAGNOSIS — K5900 Constipation, unspecified: Secondary | ICD-10-CM

## 2013-10-31 MED ORDER — OMEPRAZOLE 20 MG PO CPDR: 20.0000 mg | DELAYED_RELEASE_CAPSULE | Freq: Every day | ORAL | Status: DC

## 2013-10-31 NOTE — Progress Notes (Signed)
Failed Clock Drawing given by Errin Chewning,CMA 

## 2013-10-31 NOTE — Patient Instructions (Addendum)
Will have you go to [facility] to get chest xray due to cough  Will start omeprazole 20 mg daily due to cough- has been sent to your pharmacy  Start colace 100 mg daily to soften stool  Follow up in 3 months (with Dr Renato Gailseed) with fasting blood work prior to visit

## 2013-10-31 NOTE — Progress Notes (Signed)
Patient ID: [name], female   DOB: October 22, 1927, 80 y.o.   MRN: [id number]    Allergies  Allergen Reactions  . Penicillins Hives  . Sulfa Antibiotics Other (See Comments)    Tongue swells    Chief Complaint  Patient presents with  . Medical Management of Chronic Issues    6 week follow up for constipation,nerve pain and cough    HPI: Patient is a 80 y.o. female seen in the office today for follow up on constipation, neuropathy and cough Pt reports constipations has improved with diet and is working on getting it better. Reports she feel likes she needs softener has not tired anything OTC Previously had dry cough, ACE was stopped and started on ARB, reports now she is more congestion and it is now a wet cough and occasionally will have clear sputum. Was previously on zytec which was not effective and this was stopped last visit. Cough has lasted at least 2 months  Smoked when she was [age] no other smoking history.  Reports she has not had any pain, no nerve pain  Or neuropathy   Reports she is up on her feet all day, does her own cooking and cleaning But does not set aside time for exercise  No falls in the last year  Denies having little interest or pleasure in doing things, reports she does not have much interaction at home but denies depression, feels ignored which aggravates her.   Denies urinary incontience Review of Systems:  Review of Systems  Constitutional: Negative for fever, chills, weight loss, malaise/fatigue and diaphoresis.  Respiratory: Negative for cough and shortness of breath.   Cardiovascular: Negative for chest pain and leg swelling.  Gastrointestinal: Positive for constipation. Negative for abdominal pain and diarrhea.  Genitourinary: Negative for dysuria and urgency.  Musculoskeletal: Negative for myalgias.  Skin: Negative for itching and rash.  Neurological: Negative for tingling, sensory change, speech change, focal weakness, weakness and headaches.   Endo/Heme/Allergies: Positive for environmental allergies.  Psychiatric/Behavioral: Negative for depression. The patient is not nervous/anxious.      Past Medical History  Diagnosis Date  . Coronary artery disease     Remote PCI. Had BMS to RCA and DES stent to Diagonal in Jan. 2012   . Diabetes mellitus     long standing  . Diastolic heart failure     EF 55 to 60% per cath in Jan 2012  . Hyperlipidemia   . Hypertension   . Carotid bruit   . Gout   . Obesity   . TIA (transient ischemic attack)   . Chronic kidney disease     Stage 3  . Ischemic cardiomyopathy     with prior EF of 35%  . Anemia   . Proteinuria   . NSTEMI (non-ST elevated myocardial infarction) Jan 2012    with PCI to RCA and DX per Dr. Excell Seltzerooper  . Carotid artery occlusion   . Stroke 2013    ?  mini    . DVT (deep venous thrombosis)    Past Surgical History  Procedure Laterality Date  . Appendectomy    . Tonsillectomy    . Left ankle fusion    . Distal rca  04/05/2010    stent  . Fracture surgery Right 2005    bilateral ankles    Social History:   reports that she quit smoking about 45 years ago. She has never used smokeless tobacco. She reports that she does not drink alcohol or  use illicit drugs.  No family history on file.  Medications: Patient's Medications  New Prescriptions   No medications on file  Previous Medications   AMLODIPINE (NORVASC) 5 MG TABLET    Take 5 mg by mouth daily. For blood pressure   ASPIRIN 81 MG TABLET    Take 81 mg by mouth daily. For heart health   BROMDAY 0.09 % SOLN    Place 1 drop into the left eye daily.   CARVEDILOL (COREG) 3.125 MG TABLET    Take 3.125 mg by mouth 2 (two) times daily with a meal. For blood pressure   CLONIDINE (CATAPRES) 0.1 MG TABLET    Take 0.1 mg by mouth daily. For blood pressure   COLESEVELAM (WELCHOL) 625 MG TABLET    Take 625 mg by mouth 2 (two) times daily with a meal. For cholesterol   EZETIMIBE-SIMVASTATIN (VYTORIN) 10-20 MG PER  TABLET    Take 0.5 tablets by mouth at bedtime. For cholesterol   LOSARTAN (COZAAR) 50 MG TABLET    Take 50 mg by mouth daily. For high blood pressure   LOTEMAX 0.5 % OPHTHALMIC SUSPENSION    Place 1 drop into the left eye 3 (three) times daily.    NITROGLYCERIN (NITROSTAT) 0.4 MG SL TABLET    Dissolve one tablet under tongue as needed for chest pain. May repeat as needed every 5 minutes up to 3 doses. Seek medical attention if pain is not relieved after 3 doses   OMEGA-3 ACID ETHYL ESTERS (LOVAZA) 1 G CAPSULE    TAKE 2 CAPSULES BY MOUTH EVERY NIGHT AT BEDTIME FOR 30 DAYS for choleserol   TDAP (BOOSTRIX) 5-2.5-18.5 LF-MCG/0.5 INJECTION    Inject 0.5 mLs into the muscle once.  Modified Medications   No medications on file  Discontinued Medications   No medications on file     Physical Exam:  Filed Vitals:   10/31/13 1256  BP: 156/82  Pulse: 62  Temp: 98.1 F (36.7 C)  TempSrc: Oral  Resp: 14  Height: 5' (1.524 m)  Weight: 140 lb 6.4 oz (63.685 kg)   Physical Exam  Constitutional: She is well-developed, well-nourished, and in no distress.  HENT:  Mouth/Throat: Oropharynx is clear and moist. No oropharyngeal exudate.  Eyes: Conjunctivae and EOM are normal. Pupils are equal, round, and reactive to light.  Neck: Normal range of motion. Neck supple. No thyromegaly present.  Cardiovascular: Normal rate, regular rhythm and normal heart sounds.   Pulmonary/Chest: Effort normal and breath sounds normal.  Abdominal: Soft. Bowel sounds are normal. She exhibits no distension.  Musculoskeletal: She exhibits no edema and no tenderness.  Neurological: She is alert.  Skin: Skin is warm and dry.  Psychiatric: Affect normal.    Labs reviewed: Basic Metabolic Panel:  Recent Labs  16/10/96 0900 09/15/13 0138 09/26/13 1410  NA 144 143 146*  K 4.8 4.6 4.8  CL 108 107 105  CO2 23 24 26   GLUCOSE 128* 140* 112*  BUN 31* 35* 39*  CREATININE 1.35* 1.30* 1.36*  CALCIUM 8.7 8.7 9.8    Liver Function Tests:  Recent Labs  01/30/13 1215 02/25/13 1206 07/29/13 0900  AST 16 20 15   ALT 8 11 8   ALKPHOS 57 46 41  BILITOT 0.4 0.6 <0.2  PROT 5.6* 6.2 5.3*  ALBUMIN  --  3.6  --    No results found for this basename: LIPASE, AMYLASE,  in the last 8760 hours No results found for this basename: AMMONIA,  in the  last 8760 hours CBC:  Recent Labs  02/25/13 1206 09/15/13 0138 09/26/13 1410  WBC 4.5 3.8* 5.7  NEUTROABS 2.0 2.1 3.5  HGB 12.0 10.6* 12.3  HCT 36.0 32.5* 36.6  MCV 91.1 94.2 91  PLT 144.0* 139* 243   Lipid Panel:  Recent Labs  01/30/13 1215 02/25/13 1206  CHOL  --  157  HDL 58 49.90  LDLCALC 52 79  TRIG 80 140.0  CHOLHDL 2.2 3   TSH: No results found for this basename: TSH,  in the last 8760 hours A1C: Lab Results  Component Value Date   HGBA1C 7.1* 07/29/2013     Assessment/Plan 1. Unspecified constipation -controlling with diet -will start colace 100 mg as needed to soften stool  2. Cough -did not respond to changing to ARB from ACE -zyrtec did not improve symptoms - DG Chest 2 View since symptoms have been on-going for 2 month  - omeprazole (PRILOSEC) 20 MG capsule; Take 1 capsule (20 mg total) by mouth daily.  Dispense: 30 capsule; Refill: 3  3. DM (diabetes mellitus), type 2 with peripheral vascular complications -off all medications, diet controlled at this time - Hemoglobin A1c; Future  4. Chronic kidney disease, unspecified stage -stable on last labs -will follow up renal function before next visit  5. Hyperlipidemia -conts vytorin  - Comprehensive metabolic panel; Future - Lipid panel; Future   [employer] form reviewed

## 2013-11-01 ENCOUNTER — Telehealth: Payer: Self-pay | Admitting: *Deleted

## 2013-11-01 NOTE — Telephone Encounter (Signed)
Spoke with patient regarding chest x-ray, [facility] Monday.

## 2013-11-01 NOTE — Telephone Encounter (Signed)
Message copied by Lamont SnowballICE, Brenn Gatton L on Fri Nov 01, 2013  2:53 PM ------      Message from: Claudie ReveringKARAM, JESSICA M      Created: Fri Nov 01, 2013  2:04 PM       Please see if pt went to get chest xray or if she plans to go today or next week      ----- Message -----         From: SYSTEM         Sent: 11/01/2013  12:01 AM           To: Claudie ReveringJessica M Karam, NP                   ------

## 2013-11-04 ENCOUNTER — Telehealth: Payer: Self-pay | Admitting: *Deleted

## 2013-11-04 ENCOUNTER — Ambulatory Visit
Admission: RE | Admit: 2013-11-04 | Discharge: 2013-11-04 | Disposition: A | Discharge status: Home / Self Care | Payer: PRIVATE HEALTH INSURANCE | Source: Ambulatory Visit | Attending: Nurse Practitioner | Admitting: Nurse Practitioner

## 2013-11-04 NOTE — Telephone Encounter (Signed)
Message copied by Lamont SnowballICE, Shateka Petrea L on Mon Nov 04, 2013  2:19 PM ------      Message from: Claudie ReveringKARAM, JESSICA M      Created: Mon Nov 04, 2013  1:22 PM       Xray is negative for any findings that would suggest why she is having cough, cont as we discussed at last OV ------

## 2013-11-04 NOTE — Telephone Encounter (Signed)
Spoke with patient regarding chest x-ray, patient stated that she understood the results and will continued with instructions from [name].

## 2013-11-04 NOTE — Telephone Encounter (Signed)
Thank you :)

## 2013-11-05 ENCOUNTER — Ambulatory Visit (INDEPENDENT_AMBULATORY_CARE_PROVIDER_SITE_OTHER): Payer: PRIVATE HEALTH INSURANCE | Admitting: Internal Medicine

## 2013-11-05 ENCOUNTER — Encounter: Payer: Self-pay | Admitting: Internal Medicine

## 2013-11-05 VITALS — BP 130/78 | HR 60 | Temp 99.0°F | Resp 10 | Wt 141.0 lb

## 2013-11-05 DIAGNOSIS — L988 Other specified disorders of the skin and subcutaneous tissue: Secondary | ICD-10-CM

## 2013-11-05 DIAGNOSIS — E1151 Type 2 diabetes mellitus with diabetic peripheral angiopathy without gangrene: Secondary | ICD-10-CM

## 2013-11-05 DIAGNOSIS — I10 Essential (primary) hypertension: Secondary | ICD-10-CM

## 2013-11-05 DIAGNOSIS — E1159 Type 2 diabetes mellitus with other circulatory complications: Secondary | ICD-10-CM

## 2013-11-05 DIAGNOSIS — L959 Vasculitis limited to the skin, unspecified: Secondary | ICD-10-CM

## 2013-11-05 DIAGNOSIS — I798 Other disorders of arteries, arterioles and capillaries in diseases classified elsewhere: Secondary | ICD-10-CM

## 2013-11-05 MED ORDER — TRIAMCINOLONE ACETONIDE 0.1 % EX CREA: TOPICAL_CREAM | CUTANEOUS | Status: DC

## 2013-11-05 NOTE — Progress Notes (Signed)
Patient ID: [name], female   DOB: November 20, 1927, 80 y.o.   MRN: [id number]    Location:    [facility]  Place of Service:  OFFICE    Allergies  Allergen Reactions  . Penicillins Hives  . Sulfa Antibiotics Other (See Comments)    Tongue swells    Chief Complaint  Patient presents with  . Acute Visit    Rash on lower limbs and on hands x 3 days     HPI:  Patient has been seen several times by Rolla Plate, NP :  09/19/13 cellulitis of legs  09/26/13 persistent rash.? Fungal--treated with clotrimazole/ betamethasone  7/23/15rash resolved  10/31/13 no return of the rash  Vasculitis of skin: relapse of the rash. She believes it is different from the prior rash. Started back about 2-3 days ago. Very red, itching and burning. Denies contact with anything that may have irritated he skin. Extends from from to above the knee bilaterally. Petechial on the mid legs and knees. Scarlet with petechia on the feet and ankle area. No welts or angioedema.  Essential hypertension: controlled    Medications: Patient's Medications  New Prescriptions   No medications on file  Previous Medications   AMLODIPINE (NORVASC) 5 MG TABLET    Take 5 mg by mouth daily. For blood pressure   AMLODIPINE (NORVASC) 5 MG TABLET    TAKE 1 TABLET BY MOUTH ONCE DAILY   ASPIRIN 81 MG TABLET    Take 81 mg by mouth daily. For heart health   BROMDAY 0.09 % SOLN    Place 1 drop into the left eye daily.   CARVEDILOL (COREG) 3.125 MG TABLET    Take 3.125 mg by mouth 2 (two) times daily with a meal. For blood pressure   CLONIDINE (CATAPRES) 0.1 MG TABLET    Take 0.1 mg by mouth daily. For blood pressure   COLESEVELAM (WELCHOL) 625 MG TABLET    Take 625 mg by mouth 2 (two) times daily with a meal. For cholesterol   EZETIMIBE-SIMVASTATIN (VYTORIN) 10-20 MG PER TABLET    Take 0.5 tablets by mouth at bedtime. For cholesterol   LOSARTAN (COZAAR) 50 MG TABLET    Take 50 mg by mouth daily. For high blood pressure   LOTEMAX 0.5 %  OPHTHALMIC SUSPENSION    Place 1 drop into the left eye 3 (three) times daily.    NITROGLYCERIN (NITROSTAT) 0.4 MG SL TABLET    Dissolve one tablet under tongue as needed for chest pain. May repeat as needed every 5 minutes up to 3 doses. Seek medical attention if pain is not relieved after 3 doses   OMEGA-3 ACID ETHYL ESTERS (LOVAZA) 1 G CAPSULE    TAKE 2 CAPSULES BY MOUTH EVERY NIGHT AT BEDTIME FOR 30 DAYS for choleserol   OMEPRAZOLE (PRILOSEC) 20 MG CAPSULE    Take 1 capsule (20 mg total) by mouth daily.   TDAP (BOOSTRIX) 5-2.5-18.5 LF-MCG/0.5 INJECTION    Inject 0.5 mLs into the muscle once.  Modified Medications   No medications on file  Discontinued Medications   No medications on file     Review of Systems  Constitutional: Negative for fever, chills, activity change, appetite change and unexpected weight change.  HENT: Positive for hearing loss. Negative for ear pain, sinus pressure, sore throat, trouble swallowing and voice change.   Cardiovascular: Positive for chest pain and leg swelling. Negative for palpitations.  Gastrointestinal: Negative for nausea, diarrhea and constipation.  Musculoskeletal: Positive for back pain  and gait problem. Negative for arthralgias and neck pain.  Skin:       Very red, itching and burning. Denies contact with anything that may have irritated he skin. Extends from from to above the knee bilaterally. Petechial on the mid legs and knees. Scarlet with petechia on the feet and ankle area. No welts or angioedema.  Hematological:       Anemia  Psychiatric/Behavioral: Negative.     Filed Vitals:   11/05/13 1550  BP: 130/78  Pulse: 60  Temp: 99 F (37.2 C)  TempSrc: Oral  Resp: 10  Weight: 141 lb (63.957 kg)  SpO2: 92%   Body mass index is 27.54 kg/(m^2).  Physical Exam  Constitutional: She is oriented to person, place, and time.  frail  HENT:  Head: Normocephalic.  Right Ear: External ear normal.  Left Ear: External ear normal.  Nose:  Nose normal.  Mouth/Throat: Oropharynx is clear and moist.  Eyes:  Lateral deviation of the right eye.  Neck: No JVD present. No tracheal deviation present. No thyromegaly present.  Cardiovascular: Normal rate, regular rhythm, normal heart sounds and intact distal pulses.  Exam reveals no gallop and no friction rub.   No murmur heard. Pulmonary/Chest: No respiratory distress. She has no wheezes. She has no rales.  Abdominal: She exhibits no distension and no mass. There is no tenderness.  Neurological: She is alert and oriented to person, place, and time. She has normal reflexes. No cranial nerve deficit.  Mild memory deficit  Skin:  Very red, itching and burning. Denies contact with anything that may have irritated he skin. Extends from from to above the knee bilaterally. Petechial on the mid legs and knees. Scarlet with petechia on the feet and ankle area. No welts or angioedema.  Psychiatric: She has a normal mood and affect. Her behavior is normal. Judgment and thought content normal.     Labs reviewed: Office Visit on 09/26/2013  Component Date Value Ref Range Status  . Glucose 09/26/2013 112* 65 - 99 mg/dL Final  . BUN 09/26/2013 39* 8 - 27 mg/dL Final  . Creatinine, Ser 09/26/2013 1.36* 0.57 - 1.00 mg/dL Final  . GFR calc non Af Amer 09/26/2013 36* >59 mL/min/1.73 Final  . GFR calc Af Amer 09/26/2013 41* >59 mL/min/1.73 Final  . BUN/Creatinine Ratio 09/26/2013 29* 11 - 26 Final  . Sodium 09/26/2013 146* 134 - 144 mmol/L Final  . Potassium 09/26/2013 4.8  3.5 - 5.2 mmol/L Final  . Chloride 09/26/2013 105  97 - 108 mmol/L Final  . CO2 09/26/2013 26  18 - 29 mmol/L Final  . Calcium 09/26/2013 9.8  8.7 - 10.3 mg/dL Final  . WBC 09/26/2013 5.7  3.4 - 10.8 x10E3/uL Final  . RBC 09/26/2013 4.02  3.77 - 5.28 x10E6/uL Final  . Hemoglobin 09/26/2013 12.3  11.1 - 15.9 g/dL Final  . HCT 09/26/2013 36.6  34.0 - 46.6 % Final  . MCV 09/26/2013 91  79 - 97 fL Final  . MCH 09/26/2013 30.6   26.6 - 33.0 pg Final  . MCHC 09/26/2013 33.6  31.5 - 35.7 g/dL Final  . RDW 09/26/2013 13.7  12.3 - 15.4 % Final  . Platelets 09/26/2013 243  150 - 379 x10E3/uL Final  . Neutrophils Relative % 09/26/2013 62   Final  . Lymphs 09/26/2013 30   Final  . Monocytes 09/26/2013 5   Final  . Eos 09/26/2013 3   Final  . Basos 09/26/2013 0   Final  .  Neutrophils Absolute 09/26/2013 3.5  1.4 - 7.0 x10E3/uL Final  . Lymphocytes Absolute 09/26/2013 1.7  0.7 - 3.1 x10E3/uL Final  . Monocytes Absolute 09/26/2013 0.3  0.1 - 0.9 x10E3/uL Final  . Eosinophils Absolute 09/26/2013 0.2  0.0 - 0.4 x10E3/uL Final  . Basophils Absolute 09/26/2013 0.0  0.0 - 0.2 x10E3/uL Final  . Immature Granulocytes 09/26/2013 0   Final  . Immature Grans (Abs) 09/26/2013 0.0  0.0 - 0.1 x10E3/uL Final  Admission on 09/15/2013, Discharged on 09/15/2013  Component Date Value Ref Range Status  . WBC 09/15/2013 3.8* 4.0 - 10.5 K/uL Final  . RBC 09/15/2013 3.45* 3.87 - 5.11 MIL/uL Final  . Hemoglobin 09/15/2013 10.6* 12.0 - 15.0 g/dL Final  . HCT 09/15/2013 32.5* 36.0 - 46.0 % Final  . MCV 09/15/2013 94.2  78.0 - 100.0 fL Final  . MCH 09/15/2013 30.7  26.0 - 34.0 pg Final  . MCHC 09/15/2013 32.6  30.0 - 36.0 g/dL Final  . RDW 09/15/2013 13.7  11.5 - 15.5 % Final  . Platelets 09/15/2013 139* 150 - 400 K/uL Final  . Neutrophils Relative % 09/15/2013 56  43 - 77 % Final  . Neutro Abs 09/15/2013 2.1  1.7 - 7.7 K/uL Final  . Lymphocytes Relative 09/15/2013 32  12 - 46 % Final  . Lymphs Abs 09/15/2013 1.2  0.7 - 4.0 K/uL Final  . Monocytes Relative 09/15/2013 8  3 - 12 % Final  . Monocytes Absolute 09/15/2013 0.3  0.1 - 1.0 K/uL Final  . Eosinophils Relative 09/15/2013 4  0 - 5 % Final  . Eosinophils Absolute 09/15/2013 0.2  0.0 - 0.7 K/uL Final  . Basophils Relative 09/15/2013 0  0 - 1 % Final  . Basophils Absolute 09/15/2013 0.0  0.0 - 0.1 K/uL Final  . Sodium 09/15/2013 143  137 - 147 mEq/L Final  . Potassium 09/15/2013 4.6   3.7 - 5.3 mEq/L Final  . Chloride 09/15/2013 107  96 - 112 mEq/L Final  . CO2 09/15/2013 24  19 - 32 mEq/L Final  . Glucose, Bld 09/15/2013 140* 70 - 99 mg/dL Final  . BUN 09/15/2013 35* 6 - 23 mg/dL Final  . Creatinine, Ser 09/15/2013 1.30* 0.50 - 1.10 mg/dL Final  . Calcium 09/15/2013 8.7  8.4 - 10.5 mg/dL Final  . GFR calc non Af Amer 09/15/2013 37* >90 mL/min Final  . GFR calc Af Amer 09/15/2013 42* >90 mL/min Final   Comment: (NOTE)                          The eGFR has been calculated using the CKD EPI equation.                          This calculation has not been validated in all clinical situations.                          eGFR's persistently <90 mL/min signify possible Chronic Kidney                          Disease.  . Sed Rate 09/15/2013 14  0 - 22 mm/hr Final    Dg Chest 2 View  11/04/2013   CLINICAL DATA:  Dry cough for several weeks.  EXAM: CHEST  2 VIEW  COMPARISON:  04/15/2010  FINDINGS: Cardiac silhouette is normal  in size and configuration. Small coronary artery stent is evident on the lateral view. No mediastinal or hilar masses. No evidence of adenopathy.  Lungs are mildly hyperexpanded. Minor interstitial thickening is noted in the lung bases. Lungs are otherwise clear. No pleural effusion or pneumothorax.  Bony thorax is demineralized but grossly intact.  IMPRESSION: No acute cardiopulmonary disease.   Electronically Signed   By: Lajean Manes M.D.   On: 11/04/2013 13:02     Assessment/Plan 1. Vasculitis of skin If rash fails to respond, consider ANA, ESR, RA titer, etc - triamcinolone cream (KENALOG) 0.1 %; Apply to rash sparingly  Dispense: 120 g; Refill: 0  2. Essential hypertension controlled

## 2013-11-05 NOTE — Patient Instructions (Signed)
Apply the cream sparingly twice daily to the rash

## 2013-11-18 ENCOUNTER — Encounter (INDEPENDENT_AMBULATORY_CARE_PROVIDER_SITE_OTHER): Payer: PRIVATE HEALTH INSURANCE | Admitting: Ophthalmology

## 2013-11-18 DIAGNOSIS — I10 Essential (primary) hypertension: Secondary | ICD-10-CM

## 2013-11-18 DIAGNOSIS — E1165 Type 2 diabetes mellitus with hyperglycemia: Secondary | ICD-10-CM

## 2013-11-18 DIAGNOSIS — E11311 Type 2 diabetes mellitus with unspecified diabetic retinopathy with macular edema: Secondary | ICD-10-CM

## 2013-11-18 DIAGNOSIS — E1139 Type 2 diabetes mellitus with other diabetic ophthalmic complication: Secondary | ICD-10-CM

## 2013-11-18 DIAGNOSIS — H35039 Hypertensive retinopathy, unspecified eye: Secondary | ICD-10-CM

## 2013-11-18 DIAGNOSIS — E11319 Type 2 diabetes mellitus with unspecified diabetic retinopathy without macular edema: Secondary | ICD-10-CM

## 2013-11-18 DIAGNOSIS — H43819 Vitreous degeneration, unspecified eye: Secondary | ICD-10-CM

## 2013-11-19 ENCOUNTER — Ambulatory Visit (INDEPENDENT_AMBULATORY_CARE_PROVIDER_SITE_OTHER): Payer: PRIVATE HEALTH INSURANCE | Admitting: Internal Medicine

## 2013-11-19 ENCOUNTER — Encounter: Payer: Self-pay | Admitting: Internal Medicine

## 2013-11-19 VITALS — BP 158/68 | HR 79 | Temp 97.8°F | Resp 18 | Ht 60.0 in | Wt 141.0 lb

## 2013-11-19 DIAGNOSIS — I10 Essential (primary) hypertension: Secondary | ICD-10-CM

## 2013-11-19 DIAGNOSIS — L959 Vasculitis limited to the skin, unspecified: Secondary | ICD-10-CM

## 2013-11-19 DIAGNOSIS — L988 Other specified disorders of the skin and subcutaneous tissue: Secondary | ICD-10-CM

## 2013-11-19 NOTE — Progress Notes (Signed)
Patient ID: [name], female   DOB: 1928/01/11, 80 y.o.   MRN: [id number]    Chief Complaint  Patient presents with  . Follow-up    rash   Allergies  Allergen Reactions  . Penicillins Hives  . Sulfa Antibiotics Other (See Comments)    Tongue swells   HPI 80 y/o female pt here for f/u on her rash. This was thought to be from vasculitis of the skin and triamcinolone cream 0.1% was prescribed. Pt applied this and mentions that her rash has resolved. She has noticed scaling of skin around her right ankle with the skin thinning out and has now preventatively started applying collagenase to the area. She just did her dressing before coming here and does not want her dressing removed  Review of Systems  Constitutional: Negative for fever, chills, activity change Psychiatric/Behavioral: Negative.    Past Medical History  Diagnosis Date  . Coronary artery disease     Remote PCI. Had BMS to RCA and DES stent to Diagonal in Jan. 2012   . Diabetes mellitus     long standing  . Diastolic heart failure     EF 55 to 60% per cath in Jan 2012  . Hyperlipidemia   . Hypertension   . Carotid bruit   . Gout   . Obesity   . TIA (transient ischemic attack)   . Chronic kidney disease     Stage 3  . Ischemic cardiomyopathy     with prior EF of 35%  . Anemia   . Proteinuria   . NSTEMI (non-ST elevated myocardial infarction) Jan 2012    with PCI to RCA and DX per Dr. Excell Seltzer  . Carotid artery occlusion   . Stroke 2013    ?  mini    . DVT (deep venous thrombosis)    Current Outpatient Prescriptions on File Prior to Visit  Medication Sig Dispense Refill  . amLODipine (NORVASC) 5 MG tablet Take 5 mg by mouth daily. For blood pressure      . aspirin 81 MG tablet Take 81 mg by mouth daily. For heart health      . BROMDAY 0.09 % SOLN Place 1 drop into the left eye daily.      . carvedilol (COREG) 3.125 MG tablet Take 3.125 mg by mouth 2 (two) times daily with a meal. For blood pressure        . cloNIDine (CATAPRES) 0.1 MG tablet Take 0.1 mg by mouth daily. For blood pressure      . colesevelam (WELCHOL) 625 MG tablet Take 625 mg by mouth 2 (two) times daily with a meal. For cholesterol      . ezetimibe-simvastatin (VYTORIN) 10-20 MG per tablet Take 0.5 tablets by mouth at bedtime. For cholesterol      . losartan (COZAAR) 50 MG tablet Take 50 mg by mouth daily. For high blood pressure      . LOTEMAX 0.5 % ophthalmic suspension Place 1 drop into the left eye 3 (three) times daily.       . nitroGLYCERIN (NITROSTAT) 0.4 MG SL tablet Dissolve one tablet under tongue as needed for chest pain. May repeat as needed every 5 minutes up to 3 doses. Seek medical attention if pain is not relieved after 3 doses  25 tablet  11  . omega-3 acid ethyl esters (LOVAZA) 1 G capsule TAKE 2 CAPSULES BY MOUTH EVERY NIGHT AT BEDTIME FOR 30 DAYS for choleserol      . omeprazole (  PRILOSEC) 20 MG capsule Take 1 capsule (20 mg total) by mouth daily.  30 capsule  3  . Tdap (BOOSTRIX) 5-2.5-18.5 LF-MCG/0.5 injection Inject 0.5 mLs into the muscle once.  0.5 mL  0  . triamcinolone cream (KENALOG) 0.1 % Apply to rash sparingly  120 g  0   No current facility-administered medications on file prior to visit.    Physical Exam  BP 158/68  Pulse 79  Temp(Src) 97.8 F (36.6 C) (Oral)  Resp 18  Ht 5' (1.524 m)  Wt 141 lb (63.957 kg)  BMI 27.54 kg/m2  SpO2 91%  Constitutional: She is oriented to person, place, and time.  HENT:   Head: Normocephalic.  Cardiovascular: Normal rate, regular rhythm, normal heart sounds and intact distal pulses. No murmur heard. Pulmonary/Chest: No respiratory distress. She has no wheezes. She has no rales.  Abdominal: She exhibits no distension and no mass. There is no tenderness.  Skin:  Skin:  normal color except for a small area in right lower leg around ankle that has chronic stasis changes. Has a dressing in place in right ankle area, clean and dry Psychiatric: She has a  normal mood and affect.   Assessment/plan  Vasculitis of skin Improved at present. Stop triamcinolone for now and monitor clinically  HTN Elevated SBP at present, pt has not taken her bp meds for today, advised to take her bp med once home and monitor bp readings. Warning signs with elevated bp explained

## 2013-11-27 ENCOUNTER — Encounter: Payer: Self-pay | Admitting: Internal Medicine

## 2013-11-27 ENCOUNTER — Ambulatory Visit (INDEPENDENT_AMBULATORY_CARE_PROVIDER_SITE_OTHER): Payer: PRIVATE HEALTH INSURANCE | Admitting: Internal Medicine

## 2013-11-27 VITALS — BP 150/72 | HR 69 | Temp 97.4°F | Resp 20 | Ht 60.0 in | Wt 139.8 lb

## 2013-11-27 DIAGNOSIS — I798 Other disorders of arteries, arterioles and capillaries in diseases classified elsewhere: Secondary | ICD-10-CM

## 2013-11-27 DIAGNOSIS — E1151 Type 2 diabetes mellitus with diabetic peripheral angiopathy without gangrene: Secondary | ICD-10-CM

## 2013-11-27 DIAGNOSIS — L03119 Cellulitis of unspecified part of limb: Secondary | ICD-10-CM

## 2013-11-27 DIAGNOSIS — L97319 Non-pressure chronic ulcer of right ankle with unspecified severity: Secondary | ICD-10-CM | POA: Insufficient documentation

## 2013-11-27 DIAGNOSIS — E1159 Type 2 diabetes mellitus with other circulatory complications: Secondary | ICD-10-CM

## 2013-11-27 DIAGNOSIS — I83003 Varicose veins of unspecified lower extremity with ulcer of ankle: Secondary | ICD-10-CM | POA: Insufficient documentation

## 2013-11-27 DIAGNOSIS — L97309 Non-pressure chronic ulcer of unspecified ankle with unspecified severity: Secondary | ICD-10-CM

## 2013-11-27 DIAGNOSIS — L02419 Cutaneous abscess of limb, unspecified: Secondary | ICD-10-CM

## 2013-11-27 DIAGNOSIS — L03115 Cellulitis of right lower limb: Secondary | ICD-10-CM

## 2013-11-27 DIAGNOSIS — L97312 Non-pressure chronic ulcer of right ankle with fat layer exposed: Secondary | ICD-10-CM

## 2013-11-27 MED ORDER — DOXYCYCLINE HYCLATE 100 MG PO TABS: 100.0000 mg | ORAL_TABLET | Freq: Two times a day (BID) | ORAL | Status: DC

## 2013-11-27 NOTE — Progress Notes (Signed)
Patient ID: [name], female   DOB: [birth year], 80 y.o.   MRN: [id number]    Chief Complaint  Patient presents with  . Acute Visit    infection on right ankle, redness, pain, warm to touch.   Allergies  Allergen Reactions  . Penicillins Hives  . Sulfa Antibiotics Other (See Comments)    Tongue swells   HPI 80 y/o female patient with history of PAD is seen today for her wound in right ankle. She has had this wound for a week now and does dressing on it. She has noticed it to be red, tender to touch and warm for past few days and is concerned of infection She has appointment with wound centre on 12/18/13 She has had hx of non healing ulcer in her right foot in past and it had been healed for few months now. She was taking shower the other day and scrubbed off the dry skin and now has open wound. Denies any drainage from it but has pain and erythema She has hx of vascular problem with CAD, HTN, HL, PAD   ROS No chest pain or dyspnea No fever or chills Appetite is fair No falls reported  Past Medical History  Diagnosis Date  . Coronary artery disease     Remote PCI. Had BMS to RCA and DES stent to Diagonal in Jan. 2012   . Diabetes mellitus     long standing  . Diastolic heart failure     EF 55 to 60% per cath in Jan 2012  . Hyperlipidemia   . Hypertension   . Carotid bruit   . Gout   . Obesity   . TIA (transient ischemic attack)   . Chronic kidney disease     Stage 3  . Ischemic cardiomyopathy     with prior EF of 35%  . Anemia   . Proteinuria   . NSTEMI (non-ST elevated myocardial infarction) Jan 2012    with PCI to RCA and DX per Dr. Excell Seltzer  . Carotid artery occlusion   . Stroke 2013    ?  mini    . DVT (deep venous thrombosis)    Medication reviewed. See [facility]  Physical exam BP 150/72  Pulse 69  Temp(Src) 97.4 F (36.3 C) (Oral)  Resp 20  Ht 5' (1.524 m)  Wt 139 lb 12.8 oz (63.413 kg)  BMI 27.30 kg/m2  SpO2 93%  General:  Alert and oriented, no  acute distress HEENT: Normal extremity: trace edema in both legs, hair loss, absent dorsalis pedis in both, unable to palpate posterior tibialis No rash, she has thickened skin in the calf region just above the medial malleolus bilaterally. Open wound in right medial ankle area, 2 x 2 mm, no drainage but has some slough on it. Tender to touch with erythema around it, normal strength in all 4 extremities skin: chronic stasis changes with hyperpigmented areas   Assessment/plan 1. Cellulitis of leg, right Will have her on doxycycline 100 mg bid for 10 days. Reassess in a week and monitor for fever, chills and worsening of the wound. With her hx of DM, recheck a1c to assess for need of diabetic med to help glucose be under control and assist with healing  2. Ulcer of right ankle, with fat layer exposed Has history of PAD. No limb threatening ischemia on exam. Will treat her empirically for cellulitis, have her be seen in wound care, continue her dressing with mupirocin and gauze for now. If  this does not heal, might need to consider repeat arteriogram as per vascular recs  3.DM See above, not on any med, check a1c today

## 2013-11-27 NOTE — Addendum Note (Signed)
Addended by: Marvia Pickles on: 11/27/2013 01:35 PM   Modules accepted: Orders

## 2013-11-28 LAB — HEMOGLOBIN A1C
ESTIMATED AVERAGE GLUCOSE: 169 mg/dL
Hgb A1c MFr Bld: 7.5 % — ABNORMAL HIGH (ref 4.8–5.6)

## 2013-12-05 ENCOUNTER — Ambulatory Visit (INDEPENDENT_AMBULATORY_CARE_PROVIDER_SITE_OTHER): Payer: PRIVATE HEALTH INSURANCE | Admitting: Nurse Practitioner

## 2013-12-05 ENCOUNTER — Encounter: Payer: Self-pay | Admitting: Nurse Practitioner

## 2013-12-05 VITALS — BP 110/66 | HR 62 | Temp 98.5°F | Resp 10 | Wt 136.0 lb

## 2013-12-05 DIAGNOSIS — L97309 Non-pressure chronic ulcer of unspecified ankle with unspecified severity: Secondary | ICD-10-CM

## 2013-12-05 DIAGNOSIS — L97312 Non-pressure chronic ulcer of right ankle with fat layer exposed: Secondary | ICD-10-CM

## 2013-12-05 DIAGNOSIS — L02419 Cutaneous abscess of limb, unspecified: Secondary | ICD-10-CM

## 2013-12-05 DIAGNOSIS — L03119 Cellulitis of unspecified part of limb: Secondary | ICD-10-CM

## 2013-12-05 DIAGNOSIS — L258 Unspecified contact dermatitis due to other agents: Secondary | ICD-10-CM

## 2013-12-05 DIAGNOSIS — L03115 Cellulitis of right lower limb: Secondary | ICD-10-CM

## 2013-12-05 DIAGNOSIS — L853 Xerosis cutis: Secondary | ICD-10-CM

## 2013-12-05 NOTE — Patient Instructions (Signed)
To use mupirocin ointment daily  Seek medical attention immediatley if redness becomes worse, fever or chills occur  Keep appt with wound care center  Keep follow up with Dr Renato Gails If needed make appt sooner

## 2013-12-05 NOTE — Progress Notes (Signed)
Patient ID: [name], female   DOB: 27-Sep-1927, 80 y.o.   MRN: [id number]    Allergies  Allergen Reactions  . Penicillins Hives  . Sulfa Antibiotics Other (See Comments)    Tongue swells    Chief Complaint  Patient presents with  . Follow-up    10 day follow-up per Dr.Pandey, infection on ankle.   . Rash    Examine rash on neck     HPI: Patient is a 80 y.o. female seen in the office today for follow up ankle wound Also reports itching on left side of neck. Feels like this was a mostiquo bite that she rub until raw place, using antibiotic ointment which has helped the itch Pt reports she has scheduled appt at the wound care clinic at the end of the month. Saw Dr Sander Radon last week and since then has been using old silvadene cream to wound. Reports before then she was using Bactroban ointment which seemed to be having better results.  Cellulitis has improved to leg. Redness is not has bright and significantly smaller per pt report after antibiotic. Took last dose last night No fevers or chills.  Does not feel like the wound has improved any. Not getting worse.   Review of Systems:  Review of Systems  Constitutional: Negative for fever, chills, weight loss, malaise/fatigue and diaphoresis.  Respiratory: Negative for cough and shortness of breath.   Cardiovascular: Negative for chest pain and leg swelling.  Gastrointestinal: Negative for abdominal pain and diarrhea.  Genitourinary: Negative for dysuria and urgency.  Musculoskeletal: Negative for myalgias.  Skin: Positive for itching (to dry place on neck).       See HPI  Neurological: Negative for tingling, sensory change, speech change, focal weakness, weakness and headaches.  Psychiatric/Behavioral: Negative for depression. The patient is not nervous/anxious.      Past Medical History  Diagnosis Date  . Coronary artery disease     Remote PCI. Had BMS to RCA and DES stent to Diagonal in Jan. 2012   . Diabetes mellitus    long standing  . Diastolic heart failure     EF 55 to 60% per cath in Jan 2012  . Hyperlipidemia   . Hypertension   . Carotid bruit   . Gout   . Obesity   . TIA (transient ischemic attack)   . Chronic kidney disease     Stage 3  . Ischemic cardiomyopathy     with prior EF of 35%  . Anemia   . Proteinuria   . NSTEMI (non-ST elevated myocardial infarction) Jan 2012    with PCI to RCA and DX per Dr. Excell Seltzer  . Carotid artery occlusion   . Stroke 2013    ?  mini    . DVT (deep venous thrombosis)    Past Surgical History  Procedure Laterality Date  . Appendectomy    . Tonsillectomy    . Left ankle fusion    . Distal rca  04/05/2010    stent  . Fracture surgery Right 2005    bilateral ankles    Social History:   reports that she quit smoking about 45 years ago. She has never used smokeless tobacco. She reports that she does not drink alcohol or use illicit drugs.  History reviewed. No pertinent family history.  Medications: Patient's Medications  New Prescriptions   No medications on file  Previous Medications   AMLODIPINE (NORVASC) 5 MG TABLET    Take 5 mg by mouth  daily. For blood pressure   ASPIRIN 81 MG TABLET    Take 81 mg by mouth daily. For heart health   BROMDAY 0.09 % SOLN    Place 1 drop into the left eye daily.   CARVEDILOL (COREG) 3.125 MG TABLET    Take 3.125 mg by mouth 2 (two) times daily with a meal. For blood pressure   CLONIDINE (CATAPRES) 0.1 MG TABLET    Take 0.1 mg by mouth daily. For blood pressure   COLESEVELAM (WELCHOL) 625 MG TABLET    Take 625 mg by mouth 2 (two) times daily with a meal. For cholesterol   COLLAGENASE (SANTYL) OINTMENT    Apply 1 application topically every other day.   DOXYCYCLINE (VIBRA-TABS) 100 MG TABLET    Take 1 tablet (100 mg total) by mouth 2 (two) times daily.   EZETIMIBE-SIMVASTATIN (VYTORIN) 10-20 MG PER TABLET    Take 0.5 tablets by mouth at bedtime. For cholesterol   LOSARTAN (COZAAR) 50 MG TABLET    Take 50 mg by  mouth daily. For high blood pressure   LOTEMAX 0.5 % OPHTHALMIC SUSPENSION    Place 1 drop into the left eye 3 (three) times daily.    NITROGLYCERIN (NITROSTAT) 0.4 MG SL TABLET    Dissolve one tablet under tongue as needed for chest pain. May repeat as needed every 5 minutes up to 3 doses. Seek medical attention if pain is not relieved after 3 doses   OMEGA-3 ACID ETHYL ESTERS (LOVAZA) 1 G CAPSULE    TAKE 2 CAPSULES BY MOUTH EVERY NIGHT AT BEDTIME FOR 30 DAYS for choleserol   OMEPRAZOLE (PRILOSEC) 20 MG CAPSULE    Take 1 capsule (20 mg total) by mouth daily.   TDAP (BOOSTRIX) 5-2.5-18.5 LF-MCG/0.5 INJECTION    Inject 0.5 mLs into the muscle once.   TIMOLOL (TIMOPTIC) 0.5 % OPHTHALMIC SOLUTION    Place 1 drop into the left eye.    TRIAMCINOLONE CREAM (KENALOG) 0.1 %    Apply to rash sparingly   TRIMETHOPRIM-POLYMYXIN B (POLYTRIM) OPHTHALMIC SOLUTION       VIGAMOX 0.5 % OPHTHALMIC SOLUTION    Place 1 drop into the left eye.   Modified Medications   No medications on file  Discontinued Medications   No medications on file     Physical Exam:  Filed Vitals:   12/05/13 1624  BP: 110/66  Pulse: 62  Temp: 98.5 F (36.9 C)  TempSrc: Oral  Resp: 10  Weight: 136 lb (61.689 kg)  SpO2: 93%    Physical Exam  Constitutional: She is well-developed, well-nourished, and in no distress.  HENT:  Mouth/Throat: Oropharynx is clear and moist. No oropharyngeal exudate.  Eyes: Conjunctivae and EOM are normal. Pupils are equal, round, and reactive to light.  Neck: Normal range of motion. Neck supple. No thyromegaly present.  Cardiovascular: Normal rate, regular rhythm and normal heart sounds.   Pulmonary/Chest: Effort normal and breath sounds normal.  Abdominal: Soft. Bowel sounds are normal. She exhibits no distension.  Musculoskeletal: She exhibits no edema and no tenderness.  Neurological: She is alert.  Skin: Skin is warm and dry.  Quarter sized dry patch of flaky skin on left neck under  ear Right ankle ulcer 1x1.5 with yellow base, no drainage or odor  chronic stasis changes with hyperpigmented areas   Psychiatric: Affect normal.    Labs reviewed: Basic Metabolic Panel:  Recent Labs  16/10/96 0900 09/15/13 0138 09/26/13 1410  NA 144 143 146*  K 4.8  4.6 4.8  CL 108 107 105  CO2 GLUCOSE 128* 140* 112*  BUN 31* 35* 39*  CREATININE 1.35* 1.30* 1.36*  CALCIUM 8.7 8.7 9.8   Liver Function Tests:  Recent Labs  01/30/13 1215 02/25/13 1206 07/29/13 0900  AST ALT ALKPHOS 57 46 41  BILITOT 0.4 0.6 <0.2  PROT 5.6* 6.2 5.3*  ALBUMIN  --  3.6  --    No results found for this basename: LIPASE, AMYLASE,  in the last 8760 hours No results found for this basename: AMMONIA,  in the last 8760 hours CBC:  Recent Labs  02/25/13 1206 09/15/13 0138 09/26/13 1410  WBC 4.5 3.8* 5.7  NEUTROABS 2.0 2.1 3.5  HGB 12.0 10.6* 12.3  HCT 36.0 32.5* 36.6  MCV 91.1 94.2 91  PLT 144.0* 139* 243   Lipid Panel:  Recent Labs  01/30/13 1215 02/25/13 1206  CHOL  --  157  HDL 58 49.90  LDLCALC 52 79  TRIG 80 140.0  CHOLHDL 2.2 3   TSH: No results found for this basename: TSH,  in the last 8760 hours A1C: Lab Results  Component Value Date   HGBA1C 7.5* 11/27/2013     Assessment/Plan  1. Cellulitis of leg, right Improved, education given that if redness, heat or pain returns or becomes worse to seek medical attention immediately   2. Ulcer of right ankle, with fat layer exposed With PAD, vascular recommended repeat arteriogram however pt does not wish to have this done at this time. Reports she will wait until after she sees wound care. Pt reports she was using silvadene to wound daily, unsure why because instructions were given to cont mupirocin with dressing changes. Pt reports she will start this back. Written instructions provided. No limb threatening ischemia on exam. Return precautions discussed  Keep follow up with wound  center  3. Dry skin dermatitis -cont ointment to affected area on neck, may use Eucerin lotion as well    To keep follow up with Dr Renato Gails

## 2013-12-09 ENCOUNTER — Other Ambulatory Visit: Payer: Self-pay | Admitting: *Deleted

## 2013-12-09 ENCOUNTER — Other Ambulatory Visit: Payer: Self-pay | Admitting: Internal Medicine

## 2013-12-09 ENCOUNTER — Other Ambulatory Visit: Payer: Self-pay | Admitting: Nurse Practitioner

## 2013-12-09 MED ORDER — MUPIROCIN 2 % EX OINT: TOPICAL_OINTMENT | CUTANEOUS | Status: DC

## 2013-12-18 ENCOUNTER — Encounter (HOSPITAL_BASED_OUTPATIENT_CLINIC_OR_DEPARTMENT_OTHER): Payer: PRIVATE HEALTH INSURANCE | Attending: General Surgery

## 2013-12-18 DIAGNOSIS — L97309 Non-pressure chronic ulcer of unspecified ankle with unspecified severity: Secondary | ICD-10-CM | POA: Diagnosis not present

## 2013-12-18 DIAGNOSIS — I872 Venous insufficiency (chronic) (peripheral): Secondary | ICD-10-CM | POA: Diagnosis present

## 2013-12-19 NOTE — H&P (Signed)
NAME:  [name], [name]                ACCOUNT NO.:  [id number]  MEDICAL RECORD NO.:  [id number]  LOCATION:  FOOT                         FACILITY:  [facility]  PHYSICIAN:  Ardath SaxPeter Leaha Cuervo, M.D.     DATE OF BIRTH:  04-Feb-1928  DATE OF ADMISSION:  12/18/2013 DATE OF DISCHARGE:                             HISTORY & PHYSICAL   This an 80 year old lady who has had problems with peripheral vascular disease and she has also had problems with venous stasis ulcers.  She comes to us with a 1.5-cm ulcer on the medial aspect of her right ankle. At the time of her workup, she was found to weigh 130 pounds, temperature 98, pulse 60, her blood pressure was 178/56.  The ulcer measured 1.4 cm x 0.7 and it was 0.1 cm deep on the right medial ankle. She is on several medicines including amlodipine, carvedilol, vitamins, fish oral, timolol drops, clonidine, and Vytorin.  She has a history of congestive heart failure, type 2 diabetes.  She has also had been diagnosis with cardiomyopathy and she has had a transient ischemic attack in the past.  She will be treated with Santyl to clean this wound up and we will also send her for vascular studies.  So, her diagnoses are venous stasis ulcer, right ankle; peripheral vascular disease; hypertension; type 2 diabetes.     Ardath SaxPeter Milanni Ayub, M.D.     PP/MEDQ  D:  12/18/2013  T:  12/19/2013  Job:  [id number]

## 2013-12-24 ENCOUNTER — Other Ambulatory Visit: Payer: Self-pay | Admitting: *Deleted

## 2013-12-25 ENCOUNTER — Encounter (HOSPITAL_BASED_OUTPATIENT_CLINIC_OR_DEPARTMENT_OTHER): Payer: PRIVATE HEALTH INSURANCE | Attending: General Surgery

## 2013-12-25 DIAGNOSIS — L97312 Non-pressure chronic ulcer of right ankle with fat layer exposed: Secondary | ICD-10-CM | POA: Diagnosis not present

## 2013-12-25 DIAGNOSIS — I739 Peripheral vascular disease, unspecified: Secondary | ICD-10-CM | POA: Insufficient documentation

## 2013-12-25 DIAGNOSIS — I87331 Chronic venous hypertension (idiopathic) with ulcer and inflammation of right lower extremity: Secondary | ICD-10-CM | POA: Diagnosis present

## 2013-12-26 ENCOUNTER — Encounter (INDEPENDENT_AMBULATORY_CARE_PROVIDER_SITE_OTHER): Payer: PRIVATE HEALTH INSURANCE | Admitting: Ophthalmology

## 2013-12-26 DIAGNOSIS — H43813 Vitreous degeneration, bilateral: Secondary | ICD-10-CM

## 2013-12-26 DIAGNOSIS — E11331 Type 2 diabetes mellitus with moderate nonproliferative diabetic retinopathy with macular edema: Secondary | ICD-10-CM | POA: Diagnosis not present

## 2013-12-26 DIAGNOSIS — E11339 Type 2 diabetes mellitus with moderate nonproliferative diabetic retinopathy without macular edema: Secondary | ICD-10-CM | POA: Diagnosis not present

## 2013-12-26 DIAGNOSIS — E11311 Type 2 diabetes mellitus with unspecified diabetic retinopathy with macular edema: Secondary | ICD-10-CM

## 2013-12-26 DIAGNOSIS — I10 Essential (primary) hypertension: Secondary | ICD-10-CM | POA: Diagnosis not present

## 2013-12-26 DIAGNOSIS — H35033 Hypertensive retinopathy, bilateral: Secondary | ICD-10-CM

## 2013-12-29 ENCOUNTER — Emergency Department (HOSPITAL_COMMUNITY)
Admission: EM | Admit: 2013-12-29 | Discharge: 2013-12-30 | Disposition: A | Discharge status: Home / Self Care | Payer: PRIVATE HEALTH INSURANCE | Attending: Emergency Medicine | Admitting: Emergency Medicine

## 2013-12-29 ENCOUNTER — Emergency Department (HOSPITAL_COMMUNITY): Payer: PRIVATE HEALTH INSURANCE

## 2013-12-29 ENCOUNTER — Encounter (HOSPITAL_COMMUNITY): Payer: Self-pay | Admitting: Emergency Medicine

## 2013-12-29 DIAGNOSIS — Z86718 Personal history of other venous thrombosis and embolism: Secondary | ICD-10-CM | POA: Insufficient documentation

## 2013-12-29 DIAGNOSIS — Z7952 Long term (current) use of systemic steroids: Secondary | ICD-10-CM | POA: Insufficient documentation

## 2013-12-29 DIAGNOSIS — Z880 Allergy status to penicillin: Secondary | ICD-10-CM | POA: Diagnosis not present

## 2013-12-29 DIAGNOSIS — R2241 Localized swelling, mass and lump, right lower limb: Secondary | ICD-10-CM | POA: Insufficient documentation

## 2013-12-29 DIAGNOSIS — E669 Obesity, unspecified: Secondary | ICD-10-CM | POA: Insufficient documentation

## 2013-12-29 DIAGNOSIS — Z8739 Personal history of other diseases of the musculoskeletal system and connective tissue: Secondary | ICD-10-CM | POA: Diagnosis not present

## 2013-12-29 DIAGNOSIS — I6529 Occlusion and stenosis of unspecified carotid artery: Secondary | ICD-10-CM | POA: Diagnosis not present

## 2013-12-29 DIAGNOSIS — E785 Hyperlipidemia, unspecified: Secondary | ICD-10-CM | POA: Diagnosis not present

## 2013-12-29 DIAGNOSIS — R05 Cough: Secondary | ICD-10-CM | POA: Diagnosis not present

## 2013-12-29 DIAGNOSIS — I129 Hypertensive chronic kidney disease with stage 1 through stage 4 chronic kidney disease, or unspecified chronic kidney disease: Secondary | ICD-10-CM | POA: Insufficient documentation

## 2013-12-29 DIAGNOSIS — Z79899 Other long term (current) drug therapy: Secondary | ICD-10-CM | POA: Insufficient documentation

## 2013-12-29 DIAGNOSIS — I252 Old myocardial infarction: Secondary | ICD-10-CM | POA: Diagnosis not present

## 2013-12-29 DIAGNOSIS — M79671 Pain in right foot: Secondary | ICD-10-CM | POA: Insufficient documentation

## 2013-12-29 DIAGNOSIS — I2510 Atherosclerotic heart disease of native coronary artery without angina pectoris: Secondary | ICD-10-CM | POA: Insufficient documentation

## 2013-12-29 DIAGNOSIS — N189 Chronic kidney disease, unspecified: Secondary | ICD-10-CM | POA: Diagnosis not present

## 2013-12-29 DIAGNOSIS — Z8673 Personal history of transient ischemic attack (TIA), and cerebral infarction without residual deficits: Secondary | ICD-10-CM | POA: Insufficient documentation

## 2013-12-29 DIAGNOSIS — I5031 Acute diastolic (congestive) heart failure: Secondary | ICD-10-CM | POA: Insufficient documentation

## 2013-12-29 DIAGNOSIS — Z862 Personal history of diseases of the blood and blood-forming organs and certain disorders involving the immune mechanism: Secondary | ICD-10-CM | POA: Diagnosis not present

## 2013-12-29 DIAGNOSIS — Z87891 Personal history of nicotine dependence: Secondary | ICD-10-CM | POA: Insufficient documentation

## 2013-12-29 DIAGNOSIS — E119 Type 2 diabetes mellitus without complications: Secondary | ICD-10-CM | POA: Insufficient documentation

## 2013-12-29 DIAGNOSIS — M7989 Other specified soft tissue disorders: Secondary | ICD-10-CM

## 2013-12-29 LAB — URINALYSIS, ROUTINE W REFLEX MICROSCOPIC
Bilirubin Urine: NEGATIVE
GLUCOSE, UA: NEGATIVE mg/dL
Hgb urine dipstick: NEGATIVE
Ketones, ur: NEGATIVE mg/dL
NITRITE: NEGATIVE
PROTEIN: 100 mg/dL — AB
Specific Gravity, Urine: 1.012 (ref 1.005–1.030)
Urobilinogen, UA: 0.2 mg/dL (ref 0.0–1.0)
pH: 7 (ref 5.0–8.0)

## 2013-12-29 LAB — CBC WITH DIFFERENTIAL/PLATELET
BASOS ABS: 0 10*3/uL (ref 0.0–0.1)
Basophils Relative: 0 % (ref 0–1)
Eosinophils Absolute: 0.1 10*3/uL (ref 0.0–0.7)
Eosinophils Relative: 3 % (ref 0–5)
HEMATOCRIT: 30.8 % — AB (ref 36.0–46.0)
Hemoglobin: 10.1 g/dL — ABNORMAL LOW (ref 12.0–15.0)
LYMPHS PCT: 37 % (ref 12–46)
Lymphs Abs: 1.6 10*3/uL (ref 0.7–4.0)
MCH: 30.4 pg (ref 26.0–34.0)
MCHC: 32.8 g/dL (ref 30.0–36.0)
MCV: 92.8 fL (ref 78.0–100.0)
Monocytes Absolute: 0.4 10*3/uL (ref 0.1–1.0)
Monocytes Relative: 10 % (ref 3–12)
NEUTROS ABS: 2.2 10*3/uL (ref 1.7–7.7)
NEUTROS PCT: 50 % (ref 43–77)
Platelets: 209 10*3/uL (ref 150–400)
RBC: 3.32 MIL/uL — AB (ref 3.87–5.11)
RDW: 13.4 % (ref 11.5–15.5)
WBC: 4.4 10*3/uL (ref 4.0–10.5)

## 2013-12-29 LAB — COMPREHENSIVE METABOLIC PANEL
ALBUMIN: 3 g/dL — AB (ref 3.5–5.2)
ALK PHOS: 63 U/L (ref 39–117)
ALT: 9 U/L (ref 0–35)
AST: 16 U/L (ref 0–37)
Anion gap: 9 (ref 5–15)
BILIRUBIN TOTAL: 0.3 mg/dL (ref 0.3–1.2)
BUN: 29 mg/dL — ABNORMAL HIGH (ref 6–23)
CHLORIDE: 109 meq/L (ref 96–112)
CO2: 26 mEq/L (ref 19–32)
Calcium: 8.8 mg/dL (ref 8.4–10.5)
Creatinine, Ser: 1.15 mg/dL — ABNORMAL HIGH (ref 0.50–1.10)
GFR calc Af Amer: 49 mL/min — ABNORMAL LOW (ref >90)
GFR calc non Af Amer: 42 mL/min — ABNORMAL LOW (ref >90)
Glucose, Bld: 132 mg/dL — ABNORMAL HIGH (ref 70–99)
POTASSIUM: 4.4 meq/L (ref 3.7–5.3)
Sodium: 144 mEq/L (ref 137–147)
Total Protein: 6.1 g/dL (ref 6.0–8.3)

## 2013-12-29 LAB — URINE MICROSCOPIC-ADD ON

## 2013-12-29 LAB — CBG MONITORING, ED: Glucose-Capillary: 158 mg/dL — ABNORMAL HIGH (ref 70–99)

## 2013-12-29 LAB — I-STAT CG4 LACTIC ACID, ED: Lactic Acid, Venous: 0.68 mmol/L (ref 0.5–2.2)

## 2013-12-29 MED ORDER — FUROSEMIDE 20 MG PO TABS: 20.0000 mg | ORAL_TABLET | Freq: Every day | ORAL | Status: DC

## 2013-12-29 MED ORDER — ONDANSETRON HCL 4 MG PO TABS: 4.0000 mg | ORAL_TABLET | Freq: Four times a day (QID) | ORAL | Status: DC

## 2013-12-29 MED ORDER — ONDANSETRON HCL 4 MG/2ML IJ SOLN
4.0000 mg | Freq: Once | INTRAMUSCULAR | Status: AC
  Administered 2013-12-29: 4 mg via INTRAVENOUS
  Filled 2013-12-29: qty 2

## 2013-12-29 MED ORDER — MORPHINE SULFATE 4 MG/ML IJ SOLN
4.0000 mg | Freq: Once | INTRAMUSCULAR | Status: AC
  Administered 2013-12-29: 4 mg via INTRAVENOUS
  Filled 2013-12-29: qty 1

## 2013-12-29 MED ORDER — OXYCODONE-ACETAMINOPHEN 5-325 MG PO TABS: 1.0000 | ORAL_TABLET | Freq: Four times a day (QID) | ORAL | Status: DC | PRN

## 2013-12-29 MED ORDER — FUROSEMIDE 10 MG/ML IJ SOLN
40.0000 mg | Freq: Once | INTRAMUSCULAR | Status: AC
  Administered 2013-12-29: 40 mg via INTRAVENOUS
  Filled 2013-12-29: qty 4

## 2013-12-29 NOTE — Discharge Instructions (Signed)
You have been scheduled for an outpatient ultrasound of your leg. Please return to the vascular lab at 8a tomorrow for the ultrasound. Return to ED immediately with chest pain or shortness of breath. You are doing very well caring for your ulcer. Take pain medication as prescribed and continue to follow up with your vascular surgeon and the wound center. Return to ED for new or worsening symptoms.   Edema Edema is an abnormal buildup of fluids. It is more common in your legs and thighs. Painless swelling of the feet and ankles is more likely as a person ages. It also is common in looser skin, like around your eyes. HOME CARE   Keep the affected body part above the level of the heart while lying down.  Do not sit still or stand for a long time.  Do not put anything right under your knees when you lie down.  Do not wear tight clothes on your upper legs.  Exercise your legs to help the puffiness (swelling) go down.  Wear elastic bandages or support stockings as told by your doctor.  A low-salt diet may help lessen the puffiness.  Only take medicine as told by your doctor. GET HELP IF:  Treatment is not working.  You have heart, liver, or kidney disease and notice that your skin looks puffy or shiny.  You have puffiness in your legs that does not get better when you raise your legs.  You have sudden weight gain for no reason. GET HELP RIGHT AWAY IF:   You have shortness of breath or chest pain.  You cannot breathe when you lie down.  You have pain, redness, or warmth in the areas that are puffy.  You have heart, liver, or kidney disease and get edema all of a sudden.  You have a fever and your symptoms get worse all of a sudden. MAKE SURE YOU:   Understand these instructions.  Will watch your condition.  Will get help right away if you are not doing well or get worse. Document Released: 08/24/2007 Document Revised: 03/12/2013 Document Reviewed: 12/28/2012 [facility]  Patient Information 2015 [employer], [place]. This information is not intended to replace advice given to you by your health care provider. Make sure you discuss any questions you have with your health care provider.

## 2013-12-29 NOTE — ED Notes (Signed)
Pt states that she has had increased leg swelling x 7 days rt leg greater than left; pt states that she used to be on Lasix but hasn't taken it in years; pt states that the swelling it tight and causing pain; pt also reports diabetic ulcer that has recently been debrided; pt reports weeping at times from legs; pt also reports cough with white sputum; pt denies congestion

## 2013-12-30 ENCOUNTER — Ambulatory Visit (HOSPITAL_COMMUNITY)
Admission: RE | Admit: 2013-12-30 | Discharge: 2013-12-30 | Disposition: A | Discharge status: Home / Self Care | Payer: PRIVATE HEALTH INSURANCE | Source: Ambulatory Visit | Attending: Emergency Medicine | Admitting: Emergency Medicine

## 2013-12-30 ENCOUNTER — Other Ambulatory Visit: Payer: Self-pay | Admitting: *Deleted

## 2013-12-30 DIAGNOSIS — M79604 Pain in right leg: Secondary | ICD-10-CM | POA: Diagnosis not present

## 2013-12-30 DIAGNOSIS — E1151 Type 2 diabetes mellitus with diabetic peripheral angiopathy without gangrene: Secondary | ICD-10-CM

## 2013-12-30 DIAGNOSIS — R609 Edema, unspecified: Secondary | ICD-10-CM | POA: Insufficient documentation

## 2013-12-30 DIAGNOSIS — L97319 Non-pressure chronic ulcer of right ankle with unspecified severity: Secondary | ICD-10-CM

## 2013-12-30 DIAGNOSIS — R600 Localized edema: Secondary | ICD-10-CM

## 2013-12-30 DIAGNOSIS — M79609 Pain in unspecified limb: Secondary | ICD-10-CM

## 2013-12-30 DIAGNOSIS — R2241 Localized swelling, mass and lump, right lower limb: Secondary | ICD-10-CM | POA: Diagnosis not present

## 2013-12-30 DIAGNOSIS — M79671 Pain in right foot: Secondary | ICD-10-CM

## 2013-12-30 NOTE — Progress Notes (Signed)
VASCULAR LAB PRELIMINARY  PRELIMINARY  PRELIMINARY  PRELIMINARY  Right lower extremity venous duplex completed.    Preliminary report:  Right:  No evidence of DVT, superficial thrombosis, or Baker's cyst.  [place], RVS 12/30/2013, 9:31 AM

## 2013-12-30 NOTE — ED Provider Notes (Signed)
CSN: [id number]     Arrival date & time 12/29/13  1928 History   First MD Initiated Contact with Patient 12/29/13 2023     Chief Complaint  Patient presents with  . Leg Swelling  . Cough     (Consider location/radiation/quality/duration/timing/severity/associated sxs/prior Treatment) HPI Comments: Patient presents emergency department for evaluation of leg swelling. She reports that this has been gradually worsening over the past 7 days. She has leg swelling bilaterally, but her right leg is worse than her left. She has a diabetic foot ulcer on her medial malleolus which is causing her significant pain. The pain is constant, but she has intermittent sharp, shooting pain that is unbearable. She has not taken any of the pain medicine that has been prescribed to her because she is unsure of what it will to. She denies any chest pain, shortness of breath, cough, fever, chills.  The history is provided by the patient. No language interpreter was used.    Past Medical History  Diagnosis Date  . Coronary artery disease     Remote PCI. Had BMS to RCA and DES stent to Diagonal in Jan. 2012   . Diabetes mellitus     long standing  . Diastolic heart failure     EF 55 to 60% per cath in Jan 2012  . Hyperlipidemia   . Hypertension   . Carotid bruit   . Gout   . Obesity   . TIA (transient ischemic attack)   . Chronic kidney disease     Stage 3  . Ischemic cardiomyopathy     with prior EF of 35%  . Anemia   . Proteinuria   . NSTEMI (non-ST elevated myocardial infarction) Jan 2012    with PCI to RCA and DX per Dr. Excell Seltzerooper  . Carotid artery occlusion   . Stroke 2013    ?  mini    . DVT (deep venous thrombosis)    Past Surgical History  Procedure Laterality Date  . Appendectomy    . Tonsillectomy    . Left ankle fusion    . Distal rca  04/05/2010    stent  . Fracture surgery Right 2005    bilateral ankles    No family history on file. History  Substance Use Topics  . Smoking  status: Former Smoker    Quit date: 03/21/1968  . Smokeless tobacco: Never Used  . Alcohol Use: No   OB History   Grav Para Term Preterm Abortions TAB SAB Ect Mult Living                 Review of Systems  Constitutional: Negative for fever and chills.  Respiratory: Negative for shortness of breath.   Cardiovascular: Positive for leg swelling. Negative for chest pain.  Gastrointestinal: Negative for nausea, vomiting and abdominal pain.  Musculoskeletal: Positive for myalgias.  Skin: Positive for color change and wound.  All other systems reviewed and are negative.     Allergies  Penicillins and Sulfa antibiotics  Home Medications   Prior to Admission medications   Medication Sig Start Date End Date Taking? Authorizing Provider  amLODipine (NORVASC) 5 MG tablet Take 5 mg by mouth daily. For blood pressure   Yes Historical Provider, MD  aspirin 81 MG tablet Take 81 mg by mouth daily. For heart health   Yes Historical Provider, MD  Bromfenac Sodium (PROLENSA) 0.07 % SOLN Apply 1 drop to eye daily.   Yes Historical Provider, MD  carvedilol (COREG) 3.125  MG tablet Take 3.125 mg by mouth 2 (two) times daily with a meal. For blood pressure   Yes Historical Provider, MD  citalopram (CELEXA) 20 MG tablet Take 20 mg by mouth daily.   Yes Historical Provider, MD  cloNIDine (CATAPRES) 0.1 MG tablet Take 0.1 mg by mouth daily. For blood pressure   Yes Historical Provider, MD  colesevelam (WELCHOL) 625 MG tablet Take 625 mg by mouth 2 (two) times daily with a meal. For cholesterol   Yes Historical Provider, MD  ezetimibe-simvastatin (VYTORIN) 10-20 MG per tablet Take 0.5 tablets by mouth at bedtime. For cholesterol   Yes Historical Provider, MD  losartan (COZAAR) 50 MG tablet Take 50 mg by mouth daily. For high blood pressure   Yes Historical Provider, MD  LOTEMAX 0.5 % ophthalmic suspension Place 1 drop into the left eye 3 (three) times daily.  09/30/11  Yes Historical Provider, MD   nitroGLYCERIN (NITROSTAT) 0.4 MG SL tablet Dissolve one tablet under tongue as needed for chest pain. May repeat as needed every 5 minutes up to 3 doses. Seek medical attention if pain is not relieved after 3 doses 08/07/13  Yes Kimber RelicArthur G Green, MD  omega-3 acid ethyl esters (LOVAZA) 1 G capsule Take 2 g by mouth at bedtime.   Yes Historical Provider, MD  omeprazole (PRILOSEC) 20 MG capsule Take 1 capsule (20 mg total) by mouth daily. 10/31/13  Yes Sharon SellerJessica K Eubanks, NP  timolol (TIMOPTIC) 0.5 % ophthalmic solution Place 1 drop into the left eye.  11/22/13  Yes Historical Provider, MD  trimethoprim-polymyxin b (POLYTRIM) ophthalmic solution Place 1 drop into both eyes every 4 (four) hours.  11/28/13  Yes Historical Provider, MD  furosemide (LASIX) 20 MG tablet Take 1 tablet (20 mg total) by mouth daily. 12/29/13   Mora BellmanHannah S Navarro Nine, PA-C  ondansetron (ZOFRAN) 4 MG tablet Take 1 tablet (4 mg total) by mouth every 6 (six) hours. 12/29/13   Mora BellmanHannah S Wendle Kina, PA-C  ONE TOUCH ULTRA TEST test strip CHECK BLOOD SUGAR DAILY 12/09/13   Tiffany Alroy DustL Reed, DO  oxyCODONE-acetaminophen (PERCOCET/ROXICET) 5-325 MG per tablet Take 1-2 tablets by mouth every 6 (six) hours as needed for moderate pain or severe pain. 12/29/13   Mora BellmanHannah S Quadir Muns, PA-C  Tdap (BOOSTRIX) 5-2.5-18.5 LF-MCG/0.5 injection Inject 0.5 mLs into the muscle once. 10/10/13   Sharon SellerJessica K Eubanks, NP   BP 153/62  Pulse 153  Temp(Src) 98 F (36.7 C)  Resp 18  Ht 5' (1.524 m)  Wt 130 lb (58.968 kg)  BMI 25.39 kg/m2  SpO2 96% Physical Exam  Nursing note and vitals reviewed. Constitutional: She is oriented to person, place, and time. She appears well-developed and well-nourished. No distress.  HENT:  Head: Normocephalic and atraumatic.  Right Ear: External ear normal.  Left Ear: External ear normal.  Nose: Nose normal.  Mouth/Throat: Oropharynx is clear and moist.  Eyes: Conjunctivae are normal.  Neck: Normal range of motion.  Cardiovascular: Normal  rate, regular rhythm and normal heart sounds.   Patient with recorded vital signs with heart rate of 153. Likely entry error - HR 60-70 on physical exam.   Pulmonary/Chest: Effort normal and breath sounds normal. No stridor. No respiratory distress. She has no wheezes. She has no rales.  Abdominal: Soft. She exhibits no distension.  Musculoskeletal: Normal range of motion.  Swelling to right lower extremity. Associated erythema. Very well-appearing diabetic foot ulcer on medial malleolus. No signs of cellulitis.  Neurological: She is alert and oriented  to person, place, and time. She has normal strength.  Skin: Skin is warm and dry. She is not diaphoretic. No erythema.  Psychiatric: She has a normal mood and affect. Her behavior is normal.    ED Course  Procedures (including critical care time) Labs Review Labs Reviewed  CBC WITH DIFFERENTIAL - Abnormal; Notable for the following:    RBC 3.32 (*)    Hemoglobin 10.1 (*)    HCT 30.8 (*)    All other components within normal limits  COMPREHENSIVE METABOLIC PANEL - Abnormal; Notable for the following:    Glucose, Bld 132 (*)    BUN 29 (*)    Creatinine, Ser 1.15 (*)    Albumin 3.0 (*)    GFR calc non Af Amer 42 (*)    GFR calc Af Amer 49 (*)    All other components within normal limits  URINALYSIS, ROUTINE W REFLEX MICROSCOPIC - Abnormal; Notable for the following:    Protein, ur 100 (*)    Leukocytes, UA TRACE (*)    All other components within normal limits  URINE MICROSCOPIC-ADD ON - Abnormal; Notable for the following:    Casts GRANULAR CAST (*)    All other components within normal limits  CBG MONITORING, ED - Abnormal; Notable for the following:    Glucose-Capillary 158 (*)    All other components within normal limits  URINE CULTURE  I-STAT CG4 LACTIC ACID, ED    Imaging Review Dg Chest 2 View  12/29/2013   CLINICAL DATA:  Chest pain and shortness of breath. Former smoker. Initial encounter.  EXAM: CHEST  2 VIEW   COMPARISON:  11/04/2013; 04/15/2010  FINDINGS: Grossly unchanged enlarged cardiac silhouette. Atherosclerotic plaque within the thoracic aorta. The lungs are hyperexpanded with diffuse nodular thickening of the pulmonary interstitium. No focal airspace opacities. No pleural effusion or pneumothorax. No evidence of edema. No acute osseus abnormalities.  IMPRESSION: Cardiomegaly and lung hyperexpansion without acute cardiopulmonary disease.   Electronically Signed   By: Simonne Come M.D.   On: 12/29/2013 22:20     EKG Interpretation   Date/Time:  Sunday December 29 2013 19:58:34 EDT Ventricular Rate:  62 PR Interval:  164 QRS Duration: 124 QT Interval:  441 QTC Calculation: 448 R Axis:   51 Text Interpretation:  Sinus rhythm Anterior infarct, old Nonspecific T  abnormalities, inferior leads Abnormal ekg Since last tracing T wave  abnormality , new Confirmed by MILLER  MD, BRIAN ([id number]) on 12/29/2013  8:02:50 PM      MDM   Final diagnoses:  Right foot pain  Right leg swelling    Patient presents to emergency department for evaluation of right foot pain and swelling. Patient with prior negative vascular ultrasound on July 1. Repeat vascular ultrasound tomorrow to rule out DVT. Diabetic ulceration does not have any signs of current infection. Patient given 40mg  of Lasix in ED. Will discharge home with pain medication and 3 days of lasix. She will follow up with PCP. Discussed reasons to return to ED immediately. Dr. Silverio Lay evaluated patient and agrees with plan. Patient / Family / Caregiver informed of clinical course, understand medical decision-making process, and agree with plan.    Mora Bellman, PA-C 01/01/14 [phone]

## 2013-12-31 LAB — URINE CULTURE

## 2014-01-01 DIAGNOSIS — L97312 Non-pressure chronic ulcer of right ankle with fat layer exposed: Secondary | ICD-10-CM | POA: Diagnosis not present

## 2014-01-01 DIAGNOSIS — I87331 Chronic venous hypertension (idiopathic) with ulcer and inflammation of right lower extremity: Secondary | ICD-10-CM | POA: Diagnosis not present

## 2014-01-01 DIAGNOSIS — I739 Peripheral vascular disease, unspecified: Secondary | ICD-10-CM | POA: Diagnosis not present

## 2014-01-02 ENCOUNTER — Ambulatory Visit (INDEPENDENT_AMBULATORY_CARE_PROVIDER_SITE_OTHER): Payer: PRIVATE HEALTH INSURANCE | Admitting: Nurse Practitioner

## 2014-01-02 ENCOUNTER — Encounter: Payer: Self-pay | Admitting: Nurse Practitioner

## 2014-01-02 VITALS — BP 180/72 | HR 68 | Temp 98.3°F | Resp 18 | Ht 60.0 in | Wt 144.6 lb

## 2014-01-02 DIAGNOSIS — R609 Edema, unspecified: Secondary | ICD-10-CM

## 2014-01-02 MED ORDER — FUROSEMIDE 20 MG PO TABS: 20.0000 mg | ORAL_TABLET | Freq: Every day | ORAL | Status: DC

## 2014-01-02 NOTE — ED Provider Notes (Signed)
Medical screening examination/treatment/procedure(s) were conducted as a shared visit with non-physician practitioner(s) and myself.  I personally evaluated the patient during the encounter.   EKG Interpretation   Date/Time:  Sunday December 29 2013 19:58:34 EDT Ventricular Rate:  62 PR Interval:  164 QRS Duration: 124 QT Interval:  441 QTC Calculation: 448 R Axis:   51 Text Interpretation:  Sinus rhythm Anterior infarct, old Nonspecific T  abnormalities, inferior leads Abnormal ekg Since last tracing T wave  abnormality , new Confirmed by MILLER  MD, BRIAN ([id number]) on 12/29/2013  8:02:50 PM      [name] is a 80 y.o. female hx of DM, CAD here with leg swelling. Bilateral leg swelling, worse on the right. Has chronic R leg ulcer and has been recently debrided by wound care. Not currently on abx. No fever or chest pain or shortness of breath. Has nonproductive cough. Here with worsening pain. Heart, lung exam unremarkable. Bilateral leg swelling, worse on R. Some venous stasis changes R lower extremity. Well appearing diabetic ulcer over medial malleolus with no signs of cellulitis. I think likely pain from the leg swelling. Cr stable, given lasix and will d/c home with short course of lasix. Otherwise no signs of CHF. Vascular US unavailable and she had neg dopplers several months ago. Will order DVT study in AM.    Richardean Canalavid H Lorely Bubb, MD 01/02/14 [phone]

## 2014-01-02 NOTE — Patient Instructions (Signed)
Take blood pressure medication, blood pressure is now high

## 2014-01-02 NOTE — Progress Notes (Signed)
Patient ID: [name], female   DOB: Feb 14, 1928, 80 y.o.   MRN: [id number]    Allergies  Allergen Reactions  . Penicillins Hives  . Sulfa Antibiotics Other (See Comments)    Tongue swells    Chief Complaint  Patient presents with  . Hospitalization Follow-up    HPI: Patient is a 80 y.o. female seen in the office today for ED follow up.  Pt with a hx of DM, CAD who went to the ED with leg swelling. Venous doppler was negative for DVT, was started on lasix but only given 4 tablets.  Took last dose today. Lasix has helped significantly with the swelling but legs are still edematous. Pt reports she thought her legs were going to bust.   Has chronic R leg ulcer and has been recently debrided by wound care; was seen yesterday by wound care doctor and following with them weekly. Currently applying santyl daily and has vascular follow up.   Pt also reporting she has stopped taking blood pressure medication due to blood pressure being low in the ED Review of Systems:  Review of Systems  Constitutional: Negative for fever, chills, weight loss, malaise/fatigue and diaphoresis.  Respiratory: Negative for cough and shortness of breath.   Cardiovascular: Positive for leg swelling. Negative for chest pain.  Gastrointestinal: Negative for abdominal pain and diarrhea.  Genitourinary: Negative for dysuria and urgency.  Musculoskeletal: Negative for myalgias.  Skin:       Venous ulcer on right ankle   Neurological: Negative for tingling, sensory change, speech change, focal weakness, weakness and headaches.  Psychiatric/Behavioral: Negative for depression. The patient is not nervous/anxious.      Past Medical History  Diagnosis Date  . Coronary artery disease     Remote PCI. Had BMS to RCA and DES stent to Diagonal in Jan. 2012   . Diabetes mellitus     long standing  . Diastolic heart failure     EF 55 to 60% per cath in Jan 2012  . Hyperlipidemia   . Hypertension   . Carotid bruit     . Gout   . Obesity   . TIA (transient ischemic attack)   . Chronic kidney disease     Stage 3  . Ischemic cardiomyopathy     with prior EF of 35%  . Anemia   . Proteinuria   . NSTEMI (non-ST elevated myocardial infarction) Jan 2012    with PCI to RCA and DX per Dr. Excell Seltzerooper  . Carotid artery occlusion   . Stroke 2013    ?  mini    . DVT (deep venous thrombosis)    Past Surgical History  Procedure Laterality Date  . Appendectomy    . Tonsillectomy    . Left ankle fusion    . Distal rca  04/05/2010    stent  . Fracture surgery Right 2005    bilateral ankles    Social History:   reports that she quit smoking about 45 years ago. She has never used smokeless tobacco. She reports that she does not drink alcohol or use illicit drugs.  No family history on file.  Medications: Patient's Medications  New Prescriptions   No medications on file  Previous Medications   AMLODIPINE (NORVASC) 5 MG TABLET    Take 5 mg by mouth daily. For blood pressure   ASPIRIN 81 MG TABLET    Take 81 mg by mouth daily. For heart health   BROMFENAC SODIUM (PROLENSA) 0.07 %  SOLN    Apply 1 drop to eye daily.   CARVEDILOL (COREG) 3.125 MG TABLET    Take 3.125 mg by mouth 2 (two) times daily with a meal. For blood pressure   CITALOPRAM (CELEXA) 20 MG TABLET    Take 20 mg by mouth daily.   CLONIDINE (CATAPRES) 0.1 MG TABLET    Take 0.1 mg by mouth daily. For blood pressure   COLESEVELAM (WELCHOL) 625 MG TABLET    Take 625 mg by mouth 2 (two) times daily with a meal. For cholesterol   EZETIMIBE-SIMVASTATIN (VYTORIN) 10-20 MG PER TABLET    Take 0.5 tablets by mouth at bedtime. For cholesterol   FUROSEMIDE (LASIX) 20 MG TABLET    Take 1 tablet (20 mg total) by mouth daily.   HYDROCODONE-ACETAMINOPHEN 5-300 MG TABS       LOSARTAN (COZAAR) 50 MG TABLET    Take 50 mg by mouth daily. For high blood pressure   LOTEMAX 0.5 % OPHTHALMIC SUSPENSION    Place 1 drop into the left eye 3 (three) times daily.     MUPIROCIN OINTMENT (BACTROBAN) 2 %       NITROGLYCERIN (NITROSTAT) 0.4 MG SL TABLET    Dissolve one tablet under tongue as needed for chest pain. May repeat as needed every 5 minutes up to 3 doses. Seek medical attention if pain is not relieved after 3 doses   OMEGA-3 ACID ETHYL ESTERS (LOVAZA) 1 G CAPSULE    Take 2 g by mouth at bedtime.   OMEPRAZOLE (PRILOSEC) 20 MG CAPSULE    Take 1 capsule (20 mg total) by mouth daily.   ONDANSETRON (ZOFRAN) 4 MG TABLET    Take 1 tablet (4 mg total) by mouth every 6 (six) hours.   ONE TOUCH ULTRA TEST TEST STRIP    CHECK BLOOD SUGAR DAILY   OXYCODONE-ACETAMINOPHEN (PERCOCET/ROXICET) 5-325 MG PER TABLET    Take 1-2 tablets by mouth every 6 (six) hours as needed for moderate pain or severe pain.   TDAP (BOOSTRIX) 5-2.5-18.5 LF-MCG/0.5 INJECTION    Inject 0.5 mLs into the muscle once.   TIMOLOL (TIMOPTIC) 0.5 % OPHTHALMIC SOLUTION    Place 1 drop into the left eye.    TRIMETHOPRIM-POLYMYXIN B (POLYTRIM) OPHTHALMIC SOLUTION    Place 1 drop into both eyes every 4 (four) hours.   Modified Medications   No medications on file  Discontinued Medications   No medications on file     Physical Exam:  Filed Vitals:   01/02/14 1541  BP: 180/72  Pulse: 68  Temp: 98.3 F (36.8 C)  TempSrc: Oral  Resp: 18  Height: 5' (1.524 m)  Weight: 144 lb 9.6 oz (65.59 kg)  SpO2: 97%    Physical Exam  Constitutional: She is well-developed, well-nourished, and in no distress.  HENT:  Mouth/Throat: Oropharynx is clear and moist. No oropharyngeal exudate.  Eyes: Conjunctivae and EOM are normal. Pupils are equal, round, and reactive to light.  Neck: Normal range of motion. Neck supple. No thyromegaly present.  Cardiovascular: Normal rate, regular rhythm and normal heart sounds.   Pulmonary/Chest: Effort normal and breath sounds normal.  Abdominal: Soft. Bowel sounds are normal. She exhibits no distension.  Musculoskeletal: She exhibits edema (2+ bilaterally). She exhibits  no tenderness.  Neurological: She is alert.  Skin: Skin is warm and dry.  Right ankle ulcer with yellow base chronic stasis changes with hyperpigmented areas   Psychiatric: Affect normal.    Labs reviewed: Basic Metabolic Panel:  Recent Labs  09/15/13 0138 09/26/13 1410 12/29/13 2139  NA 143 146* 144  K 4.6 4.8 4.4  CL 107 105 109  CO2 24 26 26   GLUCOSE 140* 112* 132*  BUN 35* 39* 29*  CREATININE 1.30* 1.36* 1.15*  CALCIUM 8.7 9.8 8.8   Liver Function Tests:  Recent Labs  02/25/13 1206 07/29/13 0900 12/29/13 2139  AST 20 15 16   ALT 11 8 9   ALKPHOS 46 41 63  BILITOT 0.6 <0.2 0.3  PROT 6.2 5.3* 6.1  ALBUMIN 3.6  --  3.0*   No results found for this basename: LIPASE, AMYLASE,  in the last 8760 hours No results found for this basename: AMMONIA,  in the last 8760 hours CBC:  Recent Labs  09/15/13 0138 09/26/13 1410 12/29/13 2139  WBC 3.8* 5.7 4.4  NEUTROABS 2.1 3.5 2.2  HGB 10.6* 12.3 10.1*  HCT 32.5* 36.6 30.8*  MCV 94.2 91 92.8  PLT 139* 243 209   Lipid Panel:  Recent Labs  01/30/13 1215 02/25/13 1206  CHOL  --  157  HDL 58 49.90  LDLCALC 52 79  TRIG 80 140.0  CHOLHDL 2.2 3   TSH: No results found for this basename: TSH,  in the last 8760 hours A1C: Lab Results  Component Value Date   HGBA1C 7.5* 11/27/2013     Assessment/Plan   Ulcer of right ankle, with fat layer exposed -conts to see wound care weekly, has appt with vascular   Edema Improved but still with increased edema bilaterally, will cont lasix 20 mg daily  Hypertension Reviewed VS per epic, no noted hypotension. To cont blood pressure medication because blood pressure is now high  Will have pt follow up in 2 weeks for edema

## 2014-01-07 ENCOUNTER — Other Ambulatory Visit: Payer: Self-pay | Admitting: Nurse Practitioner

## 2014-01-07 ENCOUNTER — Encounter: Payer: Self-pay | Admitting: Vascular Surgery

## 2014-01-08 ENCOUNTER — Ambulatory Visit (HOSPITAL_COMMUNITY)
Admission: RE | Admit: 2014-01-08 | Discharge: 2014-01-08 | Disposition: A | Discharge status: Home / Self Care | Payer: PRIVATE HEALTH INSURANCE | Source: Ambulatory Visit | Attending: Vascular Surgery | Admitting: Vascular Surgery

## 2014-01-08 ENCOUNTER — Encounter: Payer: Self-pay | Admitting: Vascular Surgery

## 2014-01-08 ENCOUNTER — Encounter (HOSPITAL_COMMUNITY): Payer: Self-pay | Admitting: Pharmacy Technician

## 2014-01-08 ENCOUNTER — Ambulatory Visit (INDEPENDENT_AMBULATORY_CARE_PROVIDER_SITE_OTHER): Payer: PRIVATE HEALTH INSURANCE | Admitting: Vascular Surgery

## 2014-01-08 ENCOUNTER — Other Ambulatory Visit: Payer: Self-pay

## 2014-01-08 VITALS — BP 158/48 | HR 62 | Ht 60.0 in | Wt 146.7 lb

## 2014-01-08 DIAGNOSIS — I255 Ischemic cardiomyopathy: Secondary | ICD-10-CM | POA: Insufficient documentation

## 2014-01-08 DIAGNOSIS — L97312 Non-pressure chronic ulcer of right ankle with fat layer exposed: Secondary | ICD-10-CM | POA: Diagnosis not present

## 2014-01-08 DIAGNOSIS — Z87891 Personal history of nicotine dependence: Secondary | ICD-10-CM | POA: Diagnosis not present

## 2014-01-08 DIAGNOSIS — I2510 Atherosclerotic heart disease of native coronary artery without angina pectoris: Secondary | ICD-10-CM | POA: Insufficient documentation

## 2014-01-08 DIAGNOSIS — Z7982 Long term (current) use of aspirin: Secondary | ICD-10-CM | POA: Insufficient documentation

## 2014-01-08 DIAGNOSIS — I70209 Unspecified atherosclerosis of native arteries of extremities, unspecified extremity: Secondary | ICD-10-CM | POA: Insufficient documentation

## 2014-01-08 DIAGNOSIS — I129 Hypertensive chronic kidney disease with stage 1 through stage 4 chronic kidney disease, or unspecified chronic kidney disease: Secondary | ICD-10-CM | POA: Insufficient documentation

## 2014-01-08 DIAGNOSIS — I252 Old myocardial infarction: Secondary | ICD-10-CM | POA: Insufficient documentation

## 2014-01-08 DIAGNOSIS — I5030 Unspecified diastolic (congestive) heart failure: Secondary | ICD-10-CM | POA: Insufficient documentation

## 2014-01-08 DIAGNOSIS — N183 Chronic kidney disease, stage 3 (moderate): Secondary | ICD-10-CM | POA: Diagnosis not present

## 2014-01-08 DIAGNOSIS — I748 Embolism and thrombosis of other arteries: Secondary | ICD-10-CM | POA: Diagnosis not present

## 2014-01-08 DIAGNOSIS — L97319 Non-pressure chronic ulcer of right ankle with unspecified severity: Secondary | ICD-10-CM | POA: Diagnosis not present

## 2014-01-08 DIAGNOSIS — I82401 Acute embolism and thrombosis of unspecified deep veins of right lower extremity: Secondary | ICD-10-CM

## 2014-01-08 DIAGNOSIS — L98499 Non-pressure chronic ulcer of skin of other sites with unspecified severity: Secondary | ICD-10-CM

## 2014-01-08 DIAGNOSIS — E119 Type 2 diabetes mellitus without complications: Secondary | ICD-10-CM | POA: Diagnosis not present

## 2014-01-08 DIAGNOSIS — E785 Hyperlipidemia, unspecified: Secondary | ICD-10-CM | POA: Diagnosis not present

## 2014-01-08 DIAGNOSIS — I739 Peripheral vascular disease, unspecified: Secondary | ICD-10-CM | POA: Diagnosis not present

## 2014-01-08 DIAGNOSIS — I87331 Chronic venous hypertension (idiopathic) with ulcer and inflammation of right lower extremity: Secondary | ICD-10-CM | POA: Diagnosis not present

## 2014-01-08 DIAGNOSIS — I6529 Occlusion and stenosis of unspecified carotid artery: Secondary | ICD-10-CM

## 2014-01-08 NOTE — Progress Notes (Signed)
[facility] [place] HISTORY AND PHYSICAL   History of Present Illness:  Patient is a 80 y.o. year old female who presents for evaluation of a right medial malleolus ulcer. This is now been present for one month. She did have a previous ulcer on the same leg 5-6 years ago. This healed spontaneously. She states the ulcer is painful. She does not really describe claudication but is minimally ambulatory. She has been seen by us previously for peripheral arterial disease.  She was seen by Dr. early in 2011 for the original ulcer which healed spontaneously. Her ABI was approximate 0.5 at that point. She'll systolic in April of this year with a lateral foot ulcer and again had ABIs of 0.5 at that time and the wound healed spontaneously..  Other medical problems include coronary disease, diabetes, diastolic heart failure, hyperlipidemia, hypertension, obesity, CKD 3 all of these are currently stable.  Past Medical History  Diagnosis Date  . Coronary artery disease     Remote PCI. Had BMS to RCA and DES stent to Diagonal in Jan. 2012   . Diabetes mellitus     long standing  . Diastolic heart failure     EF 55 to 60% per cath in Jan 2012  . Hyperlipidemia   . Hypertension   . Carotid bruit   . Gout   . Obesity   . TIA (transient ischemic attack)   . Chronic kidney disease     Stage 3  . Ischemic cardiomyopathy     with prior EF of 35%  . Anemia   . Proteinuria   . NSTEMI (non-ST elevated myocardial infarction) Jan 2012    with PCI to RCA and DX per Dr. Excell Seltzerooper  . Carotid artery occlusion   . Stroke 2013    ?  mini    . DVT (deep venous thrombosis)     Past Surgical History  Procedure Laterality Date  . Appendectomy    . Tonsillectomy    . Left ankle fusion    . Distal rca  04/05/2010    stent  . Fracture surgery Right 2005    bilateral ankles     Social History History  Substance Use Topics  . Smoking status: Former Smoker    Quit date: 03/21/1968  .  Smokeless tobacco: Never Used  . Alcohol Use: No    Family History History reviewed. No pertinent family history.  Allergies  Allergies  Allergen Reactions  . Penicillins Hives  . Sulfa Antibiotics Other (See Comments)    Tongue swells     Current Outpatient Prescriptions  Medication Sig Dispense Refill  . amLODipine (NORVASC) 5 MG tablet Take 5 mg by mouth daily. For blood pressure      . aspirin 81 MG tablet Take 81 mg by mouth daily. For heart health      . Bromfenac Sodium (PROLENSA) 0.07 % SOLN Apply 1 drop to eye daily.      . carvedilol (COREG) 3.125 MG tablet Take 3.125 mg by mouth 2 (two) times daily with a meal. For blood pressure      . citalopram (CELEXA) 20 MG tablet Take 20 mg by mouth daily.      . cloNIDine (CATAPRES) 0.1 MG tablet Take 0.1 mg by mouth daily. For blood pressure      . colesevelam (WELCHOL) 625 MG tablet Take 625 mg by mouth 2 (two) times daily with a meal. For cholesterol      . ezetimibe-simvastatin (VYTORIN)  10-20 MG per tablet Take 0.5 tablets by mouth at bedtime. For cholesterol      . furosemide (LASIX) 20 MG tablet Take 1 tablet (20 mg total) by mouth daily.  30 tablet  0  . Hydrocodone-Acetaminophen 5-300 MG TABS       . losartan (COZAAR) 50 MG tablet Take 50 mg by mouth daily. For high blood pressure      . LOTEMAX 0.5 % ophthalmic suspension Place 1 drop into the left eye 3 (three) times daily.       . mupirocin ointment (BACTROBAN) 2 %       . nitroGLYCERIN (NITROSTAT) 0.4 MG SL tablet Dissolve one tablet under tongue as needed for chest pain. May repeat as needed every 5 minutes up to 3 doses. Seek medical attention if pain is not relieved after 3 doses  25 tablet  11  . omega-3 acid ethyl esters (LOVAZA) 1 G capsule Take 2 g by mouth at bedtime.      Marland Kitchen omeprazole (PRILOSEC) 20 MG capsule Take 1 capsule (20 mg total) by mouth daily.  30 capsule  3  . ondansetron (ZOFRAN) 4 MG tablet Take 1 tablet (4 mg total) by mouth every 6 (six) hours.   12 tablet  0  . ONE TOUCH ULTRA TEST test strip CHECK BLOOD SUGAR DAILY  100 each  3  . oxyCODONE-acetaminophen (PERCOCET/ROXICET) 5-325 MG per tablet Take 1-2 tablets by mouth every 6 (six) hours as needed for moderate pain or severe pain.  15 tablet  0  . Tdap (BOOSTRIX) 5-2.5-18.5 LF-MCG/0.5 injection Inject 0.5 mLs into the muscle once.  0.5 mL  0  . timolol (TIMOPTIC) 0.5 % ophthalmic solution Place 1 drop into the left eye.       . trimethoprim-polymyxin b (POLYTRIM) ophthalmic solution Place 1 drop into both eyes every 4 (four) hours.        No current facility-administered medications for this visit.    ROS:   General:  No weight loss, Fever, chills  HEENT: No recent headaches, no nasal bleeding, no visual changes, no sore throat  Neurologic: No dizziness, blackouts, seizures. No recent symptoms of stroke or mini- stroke. No recent episodes of slurred speech, or temporary blindness.  Cardiac: No recent episodes of chest pain/pressure, no shortness of breath at rest.  + shortness of breath with exertion.  Denies history of atrial fibrillation or irregular heartbeat  Vascular: No history of rest pain in feet.  No history of claudication.  +history of non-healing ulcer, + history of DVT   Pulmonary: No home oxygen, no productive cough, no hemoptysis,  No asthma or wheezing  Musculoskeletal:  [x ] Arthritis, [ ]  Low back pain,  [x ] Joint pain  Hematologic:No history of hypercoagulable state.  No history of easy bleeding.  No history of anemia  Gastrointestinal: No hematochezia or melena,  No gastroesophageal reflux, no trouble swallowing  Urinary: [x ] chronic Kidney disease, [ ]  on HD - [ ]  MWF or [ ]  TTHS, [ ]  Burning with urination, [ ]  Frequent urination, [ ]  Difficulty urinating;   Skin: No rashes  Psychological: No history of anxiety,  No history of depression   Physical Examination  Filed Vitals:   01/08/14 0954  BP: 158/48  Pulse: 62  Height: 5' (1.524 m)   Weight: 146 lb 11.2 oz (66.543 kg)  SpO2: 93%    Body mass index is 28.65 kg/(m^2).  General:  Alert and oriented, no acute distress HEENT:  Normal Neck: Bilateral carotid bruits no JVD Pulmonary: Clear to auscultation bilaterally Cardiac: Regular Rate and Rhythm without murmur Abdomen: Soft, non-tender, non-distended, no mass, no scars Skin: No rash, 3 cm ulceration right medial malleolus less than 1 mm depth some granulation at the base Extremity Pulses:  2+ radial, brachial, femoral, absent popliteal dorsalis pedis, posterior tibial pulses bilaterally Musculoskeletal: No deformity right leg edema trace left leg edema  Neurologic: Upper and lower extremity motor 5/5 and symmetric  DATA:  Patient had bilateral ABIs performed today. ABI on the right is 0.37 left 0.58 previously known to have chronic SFA occlusion and left SFA stenosis from April of 2015.  Patient also had a carotid duplex exam today for bilateral bruits this showed less than 40% internal carotid artery stenosis bilaterally  The patient also had swelling of both lower extremities although the right was worse than the left so we also did a DVT ultrasound showed no evidence of DVT.   ASSESSMENT:  Nonhealing wound right leg that does not appear to be healing spontaneously this time. Known chronic SFA occlusion an ABI of 0.4 on the right side.   PLAN:  Aortogram with bilateral lower extremity runoff possible angioplasty or stenting. Risks benefits possible complications and procedure details of this procedure were described to the patient today including but not limited to bleeding infection contrast reaction vessel injury. She understands and agrees to proceed.  Fabienne Brunsharles Fields, MD [facility] Office: [phone] Pager: [phone]

## 2014-01-10 ENCOUNTER — Other Ambulatory Visit: Payer: Self-pay

## 2014-01-10 DIAGNOSIS — I82401 Acute embolism and thrombosis of unspecified deep veins of right lower extremity: Secondary | ICD-10-CM

## 2014-01-10 DIAGNOSIS — I6529 Occlusion and stenosis of unspecified carotid artery: Secondary | ICD-10-CM

## 2014-01-10 DIAGNOSIS — I70209 Unspecified atherosclerosis of native arteries of extremities, unspecified extremity: Secondary | ICD-10-CM

## 2014-01-10 DIAGNOSIS — L98499 Non-pressure chronic ulcer of skin of other sites with unspecified severity: Secondary | ICD-10-CM

## 2014-01-15 DIAGNOSIS — I87331 Chronic venous hypertension (idiopathic) with ulcer and inflammation of right lower extremity: Secondary | ICD-10-CM | POA: Diagnosis not present

## 2014-01-15 DIAGNOSIS — I739 Peripheral vascular disease, unspecified: Secondary | ICD-10-CM | POA: Diagnosis not present

## 2014-01-15 DIAGNOSIS — L97312 Non-pressure chronic ulcer of right ankle with fat layer exposed: Secondary | ICD-10-CM | POA: Diagnosis not present

## 2014-01-16 ENCOUNTER — Ambulatory Visit (INDEPENDENT_AMBULATORY_CARE_PROVIDER_SITE_OTHER): Payer: PRIVATE HEALTH INSURANCE | Admitting: Nurse Practitioner

## 2014-01-16 ENCOUNTER — Encounter: Payer: Self-pay | Admitting: Nurse Practitioner

## 2014-01-16 VITALS — BP 158/60 | HR 61 | Temp 98.6°F | Resp 20 | Ht 60.0 in | Wt 147.6 lb

## 2014-01-16 DIAGNOSIS — E785 Hyperlipidemia, unspecified: Secondary | ICD-10-CM

## 2014-01-16 DIAGNOSIS — L97312 Non-pressure chronic ulcer of right ankle with fat layer exposed: Secondary | ICD-10-CM

## 2014-01-16 DIAGNOSIS — R609 Edema, unspecified: Secondary | ICD-10-CM

## 2014-01-16 DIAGNOSIS — I10 Essential (primary) hypertension: Secondary | ICD-10-CM

## 2014-01-16 MED ORDER — SODIUM CHLORIDE 0.9 % IV SOLN
INTRAVENOUS | Status: DC
  Administered 2014-01-17: 07:00:00 via INTRAVENOUS

## 2014-01-16 MED ORDER — FUROSEMIDE 40 MG PO TABS: 40.0000 mg | ORAL_TABLET | Freq: Every day | ORAL | Status: DC

## 2014-01-16 NOTE — Progress Notes (Signed)
Patient ID: [name] Dominic, female   DOB: [birth year], 80 y.o.   MRN: [id number] Patient ID: [name] Walle, female   DOB: [birth year], 80 y.o.   MRN: [id number]    Allergies  Allergen Reactions  . Sulfa Antibiotics Other (See Comments)    Tongue swells  . Penicillins Hives    Chief Complaint  Patient presents with  . Medical Management of Chronic Issues    right leg edema    HPI: Patient is a 80 y.o. female seen in the office today for follow up of LE swelling  Pt with a hx of DM, CAD who went to the ED with leg swelling. Venous doppler was negative for DVT, was started on lasix originally was helping but now swelling is worse. Feeling like leg may bust. Since last visit pt has followed up with vein and vascular due to chronic R leg ulcer. Pt also following wound care. Tomorrow having outpatient surgery with vein and vascular, placing stent in right leg to help with blood flow.  Review of Systems:  Review of Systems  Constitutional: Negative for fever, chills, weight loss, malaise/fatigue and diaphoresis.  Respiratory: Negative for cough and shortness of breath.   Cardiovascular: Positive for leg swelling. Negative for chest pain.  Gastrointestinal: Negative for abdominal pain and diarrhea.  Genitourinary: Negative for dysuria and urgency.  Musculoskeletal: Positive for myalgias (significant pain to bilateral LE).  Skin:       Venous ulcer on right ankle   Neurological: Negative for tingling, sensory change, speech change, focal weakness, weakness and headaches.  Psychiatric/Behavioral: Negative for depression. The patient is not nervous/anxious.      Past Medical History  Diagnosis Date  . Coronary artery disease     Remote PCI. Had BMS to RCA and DES stent to Diagonal in Jan. 2012   . Diabetes mellitus     long standing  . Diastolic heart failure     EF 55 to 60% per cath in Jan 2012  . Hyperlipidemia   . Hypertension   . Carotid bruit   . Gout   . Obesity   . TIA  (transient ischemic attack)   . Chronic kidney disease     Stage 3  . Ischemic cardiomyopathy     with prior EF of 35%  . Anemia   . Proteinuria   . NSTEMI (non-ST elevated myocardial infarction) Jan 2012    with PCI to RCA and DX per Dr. Excell Seltzerooper  . Carotid artery occlusion   . Stroke 2013    ?  mini    . DVT (deep venous thrombosis)    Past Surgical History  Procedure Laterality Date  . Appendectomy    . Tonsillectomy    . Left ankle fusion    . Distal rca  04/05/2010    stent  . Fracture surgery Right 2005    bilateral ankles    Social History:   reports that she quit smoking about 45 years ago. She has never used smokeless tobacco. She reports that she does not drink alcohol or use illicit drugs.  No family history on file.  Medications: Patient's Medications  New Prescriptions   No medications on file  Previous Medications   AMLODIPINE (NORVASC) 5 MG TABLET    Take 5 mg by mouth daily. For blood pressure   ASPIRIN 81 MG TABLET    Take 81 mg by mouth daily. For heart health   BROMFENAC SODIUM (PROLENSA) 0.07 % SOLN  Place 1 drop into the left eye daily.    CARVEDILOL (COREG) 3.125 MG TABLET    Take 3.125 mg by mouth 2 (two) times daily with a meal. For blood pressure   CITALOPRAM (CELEXA) 20 MG TABLET    Take 20 mg by mouth daily.   CLONIDINE (CATAPRES) 0.1 MG TABLET    Take 0.1 mg by mouth daily. For blood pressure   COLESEVELAM (WELCHOL) 625 MG TABLET    Take 625 mg by mouth 2 (two) times daily with a meal. For cholesterol   EZETIMIBE-SIMVASTATIN (VYTORIN) 10-20 MG PER TABLET    Take 0.5 tablets by mouth at bedtime. For cholesterol   FUROSEMIDE (LASIX) 20 MG TABLET    Take 20 mg by mouth daily.   GLUCOSE BLOOD (ONE TOUCH ULTRA TEST) TEST STRIP    1 each by Other route See admin instructions. Check blood sugar daily as needed.   LOSARTAN (COZAAR) 50 MG TABLET    Take 50 mg by mouth daily. For high blood pressure   LOTEMAX 0.5 % OPHTHALMIC SUSPENSION    Place 1 drop  into the left eye 3 (three) times daily.    MUPIROCIN OINTMENT (BACTROBAN) 2 %    Apply 1 application topically daily.    NITROGLYCERIN (NITROSTAT) 0.4 MG SL TABLET    Dissolve one tablet under tongue as needed for chest pain. May repeat as needed every 5 minutes up to 3 doses. Seek medical attention if pain is not relieved after 3 doses   OMEGA-3 ACID ETHYL ESTERS (LOVAZA) 1 G CAPSULE    Take 2 g by mouth at bedtime.   OMEPRAZOLE (PRILOSEC) 20 MG CAPSULE    Take 1 capsule (20 mg total) by mouth daily.   ONDANSETRON (ZOFRAN) 4 MG TABLET    Take 1 tablet (4 mg total) by mouth every 6 (six) hours.   OXYCODONE-ACETAMINOPHEN (PERCOCET/ROXICET) 5-325 MG PER TABLET    Take 0.5-1 tablets by mouth every 6 (six) hours as needed for severe pain.   SANTYL OINTMENT    Apply 1 application topically daily.    TIMOLOL (TIMOPTIC) 0.5 % OPHTHALMIC SOLUTION    Place 1 drop into the left eye daily.    TRIMETHOPRIM-POLYMYXIN B (POLYTRIM) OPHTHALMIC SOLUTION    Place 1 drop into both eyes every 4 (four) hours.   Modified Medications   No medications on file  Discontinued Medications   No medications on file     Physical Exam:  Filed Vitals:   01/16/14 1447  BP: 158/60  Pulse: 61  Temp: 98.6 F (37 C)  TempSrc: Oral  Resp: 20  Height: 5' (1.524 m)  Weight: 147 lb 9.6 oz (66.951 kg)  SpO2: 95%    Physical Exam  Constitutional: She is well-developed, well-nourished, and in no distress.  HENT:  Mouth/Throat: Oropharynx is clear and moist. No oropharyngeal exudate.  Eyes: Conjunctivae and EOM are normal. Pupils are equal, round, and reactive to light.  Neck: Normal range of motion. Neck supple. No thyromegaly present.  Cardiovascular: Normal rate, regular rhythm and normal heart sounds.   Pulmonary/Chest: Effort normal and breath sounds normal.  Abdominal: Soft. Bowel sounds are normal. She exhibits no distension.  Musculoskeletal: She exhibits edema (2+ pitting edema bilaterally ). She exhibits no  tenderness.  Neurological: She is alert.  Skin: Skin is warm and dry.  Right ankle ulcer with yellow base chronic stasis changes with hyperpigmented areas   Psychiatric: Affect normal.    Labs reviewed: Basic Metabolic Panel:  Recent Labs  09/15/13 0138 09/26/13 1410 12/29/13 2139  NA 143 146* 144  K 4.6 4.8 4.4  CL 107 105 109  CO2 24 26 26   GLUCOSE 140* 112* 132*  BUN 35* 39* 29*  CREATININE 1.30* 1.36* 1.15*  CALCIUM 8.7 9.8 8.8   Liver Function Tests:  Recent Labs  02/25/13 1206 07/29/13 0900 12/29/13 2139  AST 20 15 16   ALT 11 8 9   ALKPHOS 46 41 63  BILITOT 0.6 <0.2 0.3  PROT 6.2 5.3* 6.1  ALBUMIN 3.6  --  3.0*   No results found for this basename: LIPASE, AMYLASE,  in the last 8760 hours No results found for this basename: AMMONIA,  in the last 8760 hours CBC:  Recent Labs  09/15/13 0138 09/26/13 1410 12/29/13 2139  WBC 3.8* 5.7 4.4  NEUTROABS 2.1 3.5 2.2  HGB 10.6* 12.3 10.1*  HCT 32.5* 36.6 30.8*  MCV 94.2 91 92.8  PLT 139* 243 209   Lipid Panel:  Recent Labs  01/30/13 1215 02/25/13 1206  CHOL  --  157  HDL 58 49.90  LDLCALC 52 79  TRIG 80 140.0  CHOLHDL 2.2 3   TSH: No results found for this basename: TSH,  in the last 8760 hours A1C: Lab Results  Component Value Date   HGBA1C 7.5* 11/27/2013     Assessment/Plan   Ulcer of right ankle, with fat layer exposed -conts to see wound care weekly -Aortogram with bilateral lower extremity runoff possible angioplasty or stenting planned for tomorrow  Edema Worse, will increase lasix to 40 mg daily Follow up bmp today  Hypertension Improved today  Hyperlipidemia Needs follow up lipids but not fasting today, follow up CMP conts on lovaza, vytorin, welchol Will have pt follow up in 1 week with Dr Glade Lloyd- pt will need BMP due to increase in lasix

## 2014-01-16 NOTE — Patient Instructions (Signed)
Will increase lasix to 40 mg, will send to pharmacy  Follow up in 1 week

## 2014-01-17 ENCOUNTER — Encounter (HOSPITAL_COMMUNITY)
Admission: RE | Disposition: A | Discharge status: Home / Self Care | Payer: Self-pay | Source: Ambulatory Visit | Attending: Vascular Surgery

## 2014-01-17 ENCOUNTER — Telehealth: Payer: Self-pay | Admitting: Vascular Surgery

## 2014-01-17 ENCOUNTER — Ambulatory Visit (HOSPITAL_COMMUNITY)
Admission: RE | Admit: 2014-01-17 | Discharge: 2014-01-19 | Disposition: A | Discharge status: Home / Self Care | Payer: PRIVATE HEALTH INSURANCE | Source: Ambulatory Visit | Attending: Vascular Surgery | Admitting: Vascular Surgery

## 2014-01-17 ENCOUNTER — Other Ambulatory Visit: Payer: Self-pay | Admitting: *Deleted

## 2014-01-17 DIAGNOSIS — I70202 Unspecified atherosclerosis of native arteries of extremities, left leg: Secondary | ICD-10-CM | POA: Diagnosis not present

## 2014-01-17 DIAGNOSIS — I739 Peripheral vascular disease, unspecified: Secondary | ICD-10-CM | POA: Diagnosis present

## 2014-01-17 DIAGNOSIS — Z79891 Long term (current) use of opiate analgesic: Secondary | ICD-10-CM | POA: Diagnosis not present

## 2014-01-17 DIAGNOSIS — N183 Chronic kidney disease, stage 3 (moderate): Secondary | ICD-10-CM | POA: Insufficient documentation

## 2014-01-17 DIAGNOSIS — L98499 Non-pressure chronic ulcer of skin of other sites with unspecified severity: Secondary | ICD-10-CM

## 2014-01-17 DIAGNOSIS — E119 Type 2 diabetes mellitus without complications: Secondary | ICD-10-CM | POA: Insufficient documentation

## 2014-01-17 DIAGNOSIS — I129 Hypertensive chronic kidney disease with stage 1 through stage 4 chronic kidney disease, or unspecified chronic kidney disease: Secondary | ICD-10-CM | POA: Insufficient documentation

## 2014-01-17 DIAGNOSIS — Z79899 Other long term (current) drug therapy: Secondary | ICD-10-CM | POA: Diagnosis not present

## 2014-01-17 DIAGNOSIS — E785 Hyperlipidemia, unspecified: Secondary | ICD-10-CM | POA: Diagnosis not present

## 2014-01-17 DIAGNOSIS — I5030 Unspecified diastolic (congestive) heart failure: Secondary | ICD-10-CM | POA: Diagnosis not present

## 2014-01-17 DIAGNOSIS — Z6828 Body mass index (BMI) 28.0-28.9, adult: Secondary | ICD-10-CM | POA: Diagnosis not present

## 2014-01-17 DIAGNOSIS — I70239 Atherosclerosis of native arteries of right leg with ulceration of unspecified site: Secondary | ICD-10-CM | POA: Insufficient documentation

## 2014-01-17 DIAGNOSIS — Z87891 Personal history of nicotine dependence: Secondary | ICD-10-CM | POA: Diagnosis not present

## 2014-01-17 DIAGNOSIS — I770 Arteriovenous fistula, acquired: Secondary | ICD-10-CM | POA: Diagnosis not present

## 2014-01-17 DIAGNOSIS — L97319 Non-pressure chronic ulcer of right ankle with unspecified severity: Secondary | ICD-10-CM | POA: Insufficient documentation

## 2014-01-17 DIAGNOSIS — L97919 Non-pressure chronic ulcer of unspecified part of right lower leg with unspecified severity: Secondary | ICD-10-CM | POA: Diagnosis present

## 2014-01-17 DIAGNOSIS — Z7982 Long term (current) use of aspirin: Secondary | ICD-10-CM | POA: Insufficient documentation

## 2014-01-17 DIAGNOSIS — I70234 Atherosclerosis of native arteries of right leg with ulceration of heel and midfoot: Secondary | ICD-10-CM

## 2014-01-17 DIAGNOSIS — Z48812 Encounter for surgical aftercare following surgery on the circulatory system: Secondary | ICD-10-CM

## 2014-01-17 DIAGNOSIS — E669 Obesity, unspecified: Secondary | ICD-10-CM | POA: Diagnosis not present

## 2014-01-17 DIAGNOSIS — I70209 Unspecified atherosclerosis of native arteries of extremities, unspecified extremity: Secondary | ICD-10-CM

## 2014-01-17 HISTORY — PX: ABDOMINAL AORTAGRAM: SHX5454

## 2014-01-17 LAB — POCT ACTIVATED CLOTTING TIME
ACTIVATED CLOTTING TIME: 174 s
ACTIVATED CLOTTING TIME: 185 s
Activated Clotting Time: 112 seconds
Activated Clotting Time: 259 seconds
Activated Clotting Time: 219 seconds

## 2014-01-17 LAB — POCT I-STAT, CHEM 8
BUN: 32 mg/dL — AB (ref 6–23)
CHLORIDE: 108 meq/L (ref 96–112)
Calcium, Ion: 1.2 mmol/L (ref 1.13–1.30)
Creatinine, Ser: 1.4 mg/dL — ABNORMAL HIGH (ref 0.50–1.10)
Glucose, Bld: 144 mg/dL — ABNORMAL HIGH (ref 70–99)
HCT: 30 % — ABNORMAL LOW (ref 36.0–46.0)
Hemoglobin: 10.2 g/dL — ABNORMAL LOW (ref 12.0–15.0)
Potassium: 4.4 mEq/L (ref 3.7–5.3)
SODIUM: 145 meq/L (ref 137–147)
TCO2: 28 mmol/L (ref 0–100)

## 2014-01-17 LAB — COMPREHENSIVE METABOLIC PANEL
ALT: 10 IU/L (ref 0–32)
AST: 16 IU/L (ref 0–40)
Albumin/Globulin Ratio: 1.6 (ref 1.1–2.5)
Albumin: 3.6 g/dL (ref 3.5–4.7)
Alkaline Phosphatase: 65 IU/L (ref 39–117)
BUN/Creatinine Ratio: 22 (ref 11–26)
BUN: 30 mg/dL — ABNORMAL HIGH (ref 8–27)
CHLORIDE: 108 mmol/L (ref 97–108)
CO2: 24 mmol/L (ref 18–29)
Calcium: 8.6 mg/dL — ABNORMAL LOW (ref 8.7–10.3)
Creatinine, Ser: 1.36 mg/dL — ABNORMAL HIGH (ref 0.57–1.00)
GFR calc Af Amer: 41 mL/min/{1.73_m2} — ABNORMAL LOW (ref >59)
GFR calc non Af Amer: 36 mL/min/{1.73_m2} — ABNORMAL LOW (ref >59)
GLUCOSE: 139 mg/dL — AB (ref 65–99)
Globulin, Total: 2.3 g/dL (ref 1.5–4.5)
Potassium: 4.8 mmol/L (ref 3.5–5.2)
Sodium: 144 mmol/L (ref 134–144)
TOTAL PROTEIN: 5.9 g/dL — AB (ref 6.0–8.5)
Total Bilirubin: 0.3 mg/dL (ref 0.0–1.2)

## 2014-01-17 LAB — GLUCOSE, CAPILLARY
GLUCOSE-CAPILLARY: 122 mg/dL — AB (ref 70–99)
GLUCOSE-CAPILLARY: 151 mg/dL — AB (ref 70–99)
GLUCOSE-CAPILLARY: 184 mg/dL — AB (ref 70–99)
Glucose-Capillary: 147 mg/dL — ABNORMAL HIGH (ref 70–99)

## 2014-01-17 SURGERY — ABDOMINAL AORTAGRAM
Anesthesia: LOCAL

## 2014-01-17 MED ORDER — MUPIROCIN 2 % EX OINT
1.0000 "application " | TOPICAL_OINTMENT | Freq: Every day | CUTANEOUS | Status: DC
  Administered 2014-01-19: 1 via TOPICAL
  Filled 2014-01-17 (×2): qty 22

## 2014-01-17 MED ORDER — MORPHINE SULFATE 2 MG/ML IJ SOLN
INTRAMUSCULAR | Status: AC
  Filled 2014-01-17: qty 1

## 2014-01-17 MED ORDER — SODIUM CHLORIDE 0.9 % IJ SOLN
3.0000 mL | INTRAMUSCULAR | Status: DC | PRN
Start: 2014-01-17 — End: 2014-01-19

## 2014-01-17 MED ORDER — EZETIMIBE-SIMVASTATIN 10-20 MG PO TABS
0.5000 | ORAL_TABLET | Freq: Every day | ORAL | Status: DC
  Administered 2014-01-17: 0.5 via ORAL
  Filled 2014-01-17 (×2): qty 0.5

## 2014-01-17 MED ORDER — MORPHINE SULFATE 10 MG/ML IJ SOLN
2.0000 mg | INTRAMUSCULAR | Status: DC | PRN
  Administered 2014-01-17 (×3): 2 mg via INTRAVENOUS

## 2014-01-17 MED ORDER — OXYCODONE-ACETAMINOPHEN 5-325 MG PO TABS
1.0000 | ORAL_TABLET | ORAL | Status: DC | PRN
  Administered 2014-01-17: 1 via ORAL

## 2014-01-17 MED ORDER — OXYCODONE-ACETAMINOPHEN 5-325 MG PO TABS
ORAL_TABLET | ORAL | Status: AC
  Filled 2014-01-17: qty 1

## 2014-01-17 MED ORDER — PHENOL 1.4 % MT LIQD
1.0000 | OROMUCOSAL | Status: DC | PRN
  Filled 2014-01-17: qty 177

## 2014-01-17 MED ORDER — CLONIDINE HCL 0.1 MG PO TABS
0.1000 mg | ORAL_TABLET | Freq: Every day | ORAL | Status: DC
  Administered 2014-01-18 – 2014-01-19 (×2): 0.1 mg via ORAL
  Filled 2014-01-17 (×2): qty 1

## 2014-01-17 MED ORDER — ONDANSETRON HCL 4 MG PO TABS
4.0000 mg | ORAL_TABLET | Freq: Four times a day (QID) | ORAL | Status: DC
  Administered 2014-01-17 – 2014-01-19 (×4): 4 mg via ORAL
  Filled 2014-01-17 (×11): qty 1

## 2014-01-17 MED ORDER — POTASSIUM CHLORIDE CRYS ER 20 MEQ PO TBCR: 20.0000 meq | EXTENDED_RELEASE_TABLET | Freq: Once | ORAL | Status: DC

## 2014-01-17 MED ORDER — FUROSEMIDE 40 MG PO TABS
40.0000 mg | ORAL_TABLET | Freq: Every day | ORAL | Status: DC
  Administered 2014-01-18 – 2014-01-19 (×2): 40 mg via ORAL
  Filled 2014-01-17 (×2): qty 1

## 2014-01-17 MED ORDER — ACETAMINOPHEN 650 MG RE SUPP: 325.0000 mg | RECTAL | Status: DC | PRN

## 2014-01-17 MED ORDER — ALUM & MAG HYDROXIDE-SIMETH 200-200-20 MG/5ML PO SUSP: 15.0000 mL | ORAL | Status: DC | PRN

## 2014-01-17 MED ORDER — LOTEPREDNOL ETABONATE 0.5 % OP SUSP
1.0000 [drp] | Freq: Three times a day (TID) | OPHTHALMIC | Status: DC
  Administered 2014-01-17 – 2014-01-19 (×5): 1 [drp] via OPHTHALMIC
  Filled 2014-01-17: qty 5

## 2014-01-17 MED ORDER — SODIUM CHLORIDE 0.45 % IV SOLN
INTRAVENOUS | Status: DC
  Administered 2014-01-17: 10:00:00 via INTRAVENOUS

## 2014-01-17 MED ORDER — OXYCODONE-ACETAMINOPHEN 5-325 MG PO TABS
0.5000 | ORAL_TABLET | ORAL | Status: DC | PRN
  Administered 2014-01-18: 1 via ORAL
  Filled 2014-01-17 (×2): qty 1

## 2014-01-17 MED ORDER — ACETAMINOPHEN 325 MG PO TABS: 325.0000 mg | ORAL_TABLET | ORAL | Status: DC | PRN

## 2014-01-17 MED ORDER — PANTOPRAZOLE SODIUM 40 MG PO TBEC
40.0000 mg | DELAYED_RELEASE_TABLET | Freq: Every day | ORAL | Status: DC
  Administered 2014-01-17 – 2014-01-18 (×2): 40 mg via ORAL
  Filled 2014-01-17: qty 1

## 2014-01-17 MED ORDER — LIDOCAINE HCL (PF) 1 % IJ SOLN
INTRAMUSCULAR | Status: AC
  Filled 2014-01-17: qty 30

## 2014-01-17 MED ORDER — FENTANYL CITRATE 0.05 MG/ML IJ SOLN
INTRAMUSCULAR | Status: AC
  Filled 2014-01-17: qty 2

## 2014-01-17 MED ORDER — TIMOLOL MALEATE 0.5 % OP SOLN
1.0000 [drp] | Freq: Every day | OPHTHALMIC | Status: DC
  Administered 2014-01-17 – 2014-01-19 (×3): 1 [drp] via OPHTHALMIC
  Filled 2014-01-17: qty 5

## 2014-01-17 MED ORDER — LOSARTAN POTASSIUM 50 MG PO TABS
50.0000 mg | ORAL_TABLET | Freq: Every day | ORAL | Status: DC
Start: 2014-01-18 — End: 2014-01-19
  Administered 2014-01-18 – 2014-01-19 (×2): 50 mg via ORAL
  Filled 2014-01-17 (×2): qty 1

## 2014-01-17 MED ORDER — INSULIN ASPART 100 UNIT/ML ~~LOC~~ SOLN
0.0000 [IU] | Freq: Three times a day (TID) | SUBCUTANEOUS | Status: DC
  Administered 2014-01-17 – 2014-01-19 (×3): 3 [IU] via SUBCUTANEOUS

## 2014-01-17 MED ORDER — MORPHINE SULFATE 2 MG/ML IJ SOLN
2.0000 mg | INTRAMUSCULAR | Status: DC | PRN
  Administered 2014-01-18: 2 mg via INTRAVENOUS
  Filled 2014-01-17: qty 1

## 2014-01-17 MED ORDER — AMLODIPINE BESYLATE 5 MG PO TABS
5.0000 mg | ORAL_TABLET | Freq: Every day | ORAL | Status: DC
  Administered 2014-01-18 – 2014-01-19 (×2): 5 mg via ORAL
  Filled 2014-01-17 (×2): qty 1

## 2014-01-17 MED ORDER — COLLAGENASE 250 UNIT/GM EX OINT
1.0000 "application " | TOPICAL_OINTMENT | Freq: Every day | CUTANEOUS | Status: DC
  Administered 2014-01-17: 1 via TOPICAL
  Filled 2014-01-17: qty 30

## 2014-01-17 MED ORDER — HEPARIN (PORCINE) IN NACL 2-0.9 UNIT/ML-% IJ SOLN
INTRAMUSCULAR | Status: AC
  Filled 2014-01-17: qty 1000

## 2014-01-17 MED ORDER — PANTOPRAZOLE SODIUM 40 MG PO TBEC
40.0000 mg | DELAYED_RELEASE_TABLET | Freq: Every day | ORAL | Status: DC
  Administered 2014-01-19 (×2): 40 mg via ORAL
  Filled 2014-01-17 (×2): qty 1

## 2014-01-17 MED ORDER — KETOROLAC TROMETHAMINE 0.5 % OP SOLN
1.0000 [drp] | Freq: Every day | OPHTHALMIC | Status: DC
  Administered 2014-01-17 – 2014-01-19 (×3): 1 [drp] via OPHTHALMIC
  Filled 2014-01-17 (×2): qty 3

## 2014-01-17 MED ORDER — COLESEVELAM HCL 625 MG PO TABS
625.0000 mg | ORAL_TABLET | Freq: Two times a day (BID) | ORAL | Status: DC
  Administered 2014-01-17 – 2014-01-19 (×4): 625 mg via ORAL
  Filled 2014-01-17 (×6): qty 1

## 2014-01-17 MED ORDER — SODIUM CHLORIDE 0.9 % IV SOLN: 250.0000 mL | INTRAVENOUS | Status: DC | PRN

## 2014-01-17 MED ORDER — SODIUM CHLORIDE 0.9 % IJ SOLN
3.0000 mL | Freq: Two times a day (BID) | INTRAMUSCULAR | Status: DC
  Administered 2014-01-17 – 2014-01-19 (×4): 3 mL via INTRAVENOUS

## 2014-01-17 MED ORDER — ASPIRIN 81 MG PO TABS
81.0000 mg | ORAL_TABLET | Freq: Every day | ORAL | Status: DC
  Administered 2014-01-18 – 2014-01-19 (×2): 81 mg via ORAL
  Filled 2014-01-17 (×2): qty 1

## 2014-01-17 MED ORDER — ONDANSETRON HCL 4 MG/2ML IJ SOLN: 4.0000 mg | Freq: Four times a day (QID) | INTRAMUSCULAR | Status: DC | PRN

## 2014-01-17 MED ORDER — HEPARIN SODIUM (PORCINE) 1000 UNIT/ML IJ SOLN
INTRAMUSCULAR | Status: AC
  Filled 2014-01-17: qty 1

## 2014-01-17 MED ORDER — HYDRALAZINE HCL 20 MG/ML IJ SOLN
INTRAMUSCULAR | Status: AC
  Filled 2014-01-17: qty 1

## 2014-01-17 MED ORDER — LABETALOL HCL 5 MG/ML IV SOLN
10.0000 mg | INTRAVENOUS | Status: DC | PRN
  Filled 2014-01-17: qty 4

## 2014-01-17 MED ORDER — HYDRALAZINE HCL 20 MG/ML IJ SOLN
5.0000 mg | INTRAMUSCULAR | Status: DC | PRN
  Administered 2014-01-17: 5 mg via INTRAVENOUS

## 2014-01-17 MED ORDER — NITROGLYCERIN 0.3 MG SL SUBL
0.3000 mg | SUBLINGUAL_TABLET | SUBLINGUAL | Status: DC | PRN
  Filled 2014-01-17: qty 100

## 2014-01-17 MED ORDER — CARVEDILOL 3.125 MG PO TABS
3.1250 mg | ORAL_TABLET | Freq: Two times a day (BID) | ORAL | Status: DC
  Administered 2014-01-18 – 2014-01-19 (×3): 3.125 mg via ORAL
  Filled 2014-01-17 (×6): qty 1

## 2014-01-17 MED ORDER — GUAIFENESIN-DM 100-10 MG/5ML PO SYRP
15.0000 mL | ORAL_SOLUTION | ORAL | Status: DC | PRN
Start: 2014-01-17 — End: 2014-01-19

## 2014-01-17 MED ORDER — METOPROLOL TARTRATE 1 MG/ML IV SOLN: 2.0000 mg | INTRAVENOUS | Status: DC | PRN

## 2014-01-17 MED ORDER — OXYCODONE-ACETAMINOPHEN 5-325 MG PO TABS: 0.5000 | ORAL_TABLET | Freq: Four times a day (QID) | ORAL | Status: DC | PRN

## 2014-01-17 MED ORDER — OMEGA-3-ACID ETHYL ESTERS 1 G PO CAPS
2.0000 g | ORAL_CAPSULE | Freq: Every day | ORAL | Status: DC
  Administered 2014-01-17 – 2014-01-18 (×2): 2 g via ORAL
  Filled 2014-01-17 (×3): qty 2

## 2014-01-17 MED ORDER — POLYMYXIN B-TRIMETHOPRIM 10000-0.1 UNIT/ML-% OP SOLN
1.0000 [drp] | OPHTHALMIC | Status: DC
  Administered 2014-01-17 – 2014-01-19 (×8): 1 [drp] via OPHTHALMIC
  Filled 2014-01-17: qty 10

## 2014-01-17 MED ORDER — CITALOPRAM HYDROBROMIDE 20 MG PO TABS
20.0000 mg | ORAL_TABLET | Freq: Every day | ORAL | Status: DC
  Administered 2014-01-18 – 2014-01-19 (×2): 20 mg via ORAL
  Filled 2014-01-17 (×2): qty 1

## 2014-01-17 MED ORDER — GUAIFENESIN-DM 100-10 MG/5ML PO SYRP: 15.0000 mL | ORAL_SOLUTION | ORAL | Status: DC | PRN

## 2014-01-17 NOTE — H&P (View-Only) (Signed)
[facility] [place] HISTORY AND PHYSICAL   History of Present Illness:  Patient is a 80 y.o. year old female who presents for evaluation of a right medial malleolus ulcer. This is now been present for one month. She did have a previous ulcer on the same leg 5-6 years ago. This healed spontaneously. She states the ulcer is painful. She does not really describe claudication but is minimally ambulatory. She has been seen by us previously for peripheral arterial disease.  She was seen by Dr. early in 2011 for the original ulcer which healed spontaneously. Her ABI was approximate 0.5 at that point. She'll systolic in April of this year with a lateral foot ulcer and again had ABIs of 0.5 at that time and the wound healed spontaneously..  Other medical problems include coronary disease, diabetes, diastolic heart failure, hyperlipidemia, hypertension, obesity, CKD 3 all of these are currently stable.  Past Medical History  Diagnosis Date  . Coronary artery disease     Remote PCI. Had BMS to RCA and DES stent to Diagonal in Jan. 2012   . Diabetes mellitus     long standing  . Diastolic heart failure     EF 55 to 60% per cath in Jan 2012  . Hyperlipidemia   . Hypertension   . Carotid bruit   . Gout   . Obesity   . TIA (transient ischemic attack)   . Chronic kidney disease     Stage 3  . Ischemic cardiomyopathy     with prior EF of 35%  . Anemia   . Proteinuria   . NSTEMI (non-ST elevated myocardial infarction) Jan 2012    with PCI to RCA and DX per Dr. Excell Seltzerooper  . Carotid artery occlusion   . Stroke 2013    ?  mini    . DVT (deep venous thrombosis)     Past Surgical History  Procedure Laterality Date  . Appendectomy    . Tonsillectomy    . Left ankle fusion    . Distal rca  04/05/2010    stent  . Fracture surgery Right 2005    bilateral ankles     Social History History  Substance Use Topics  . Smoking status: Former Smoker    Quit date: 03/21/1968  .  Smokeless tobacco: Never Used  . Alcohol Use: No    Family History History reviewed. No pertinent family history.  Allergies  Allergies  Allergen Reactions  . Penicillins Hives  . Sulfa Antibiotics Other (See Comments)    Tongue swells     Current Outpatient Prescriptions  Medication Sig Dispense Refill  . amLODipine (NORVASC) 5 MG tablet Take 5 mg by mouth daily. For blood pressure      . aspirin 81 MG tablet Take 81 mg by mouth daily. For heart health      . Bromfenac Sodium (PROLENSA) 0.07 % SOLN Apply 1 drop to eye daily.      . carvedilol (COREG) 3.125 MG tablet Take 3.125 mg by mouth 2 (two) times daily with a meal. For blood pressure      . citalopram (CELEXA) 20 MG tablet Take 20 mg by mouth daily.      . cloNIDine (CATAPRES) 0.1 MG tablet Take 0.1 mg by mouth daily. For blood pressure      . colesevelam (WELCHOL) 625 MG tablet Take 625 mg by mouth 2 (two) times daily with a meal. For cholesterol      . ezetimibe-simvastatin (VYTORIN)  10-20 MG per tablet Take 0.5 tablets by mouth at bedtime. For cholesterol      . furosemide (LASIX) 20 MG tablet Take 1 tablet (20 mg total) by mouth daily.  30 tablet  0  . Hydrocodone-Acetaminophen 5-300 MG TABS       . losartan (COZAAR) 50 MG tablet Take 50 mg by mouth daily. For high blood pressure      . LOTEMAX 0.5 % ophthalmic suspension Place 1 drop into the left eye 3 (three) times daily.       . mupirocin ointment (BACTROBAN) 2 %       . nitroGLYCERIN (NITROSTAT) 0.4 MG SL tablet Dissolve one tablet under tongue as needed for chest pain. May repeat as needed every 5 minutes up to 3 doses. Seek medical attention if pain is not relieved after 3 doses  25 tablet  11  . omega-3 acid ethyl esters (LOVAZA) 1 G capsule Take 2 g by mouth at bedtime.      Marland Kitchen omeprazole (PRILOSEC) 20 MG capsule Take 1 capsule (20 mg total) by mouth daily.  30 capsule  3  . ondansetron (ZOFRAN) 4 MG tablet Take 1 tablet (4 mg total) by mouth every 6 (six) hours.   12 tablet  0  . ONE TOUCH ULTRA TEST test strip CHECK BLOOD SUGAR DAILY  100 each  3  . oxyCODONE-acetaminophen (PERCOCET/ROXICET) 5-325 MG per tablet Take 1-2 tablets by mouth every 6 (six) hours as needed for moderate pain or severe pain.  15 tablet  0  . Tdap (BOOSTRIX) 5-2.5-18.5 LF-MCG/0.5 injection Inject 0.5 mLs into the muscle once.  0.5 mL  0  . timolol (TIMOPTIC) 0.5 % ophthalmic solution Place 1 drop into the left eye.       . trimethoprim-polymyxin b (POLYTRIM) ophthalmic solution Place 1 drop into both eyes every 4 (four) hours.        No current facility-administered medications for this visit.    ROS:   General:  No weight loss, Fever, chills  HEENT: No recent headaches, no nasal bleeding, no visual changes, no sore throat  Neurologic: No dizziness, blackouts, seizures. No recent symptoms of stroke or mini- stroke. No recent episodes of slurred speech, or temporary blindness.  Cardiac: No recent episodes of chest pain/pressure, no shortness of breath at rest.  + shortness of breath with exertion.  Denies history of atrial fibrillation or irregular heartbeat  Vascular: No history of rest pain in feet.  No history of claudication.  +history of non-healing ulcer, + history of DVT   Pulmonary: No home oxygen, no productive cough, no hemoptysis,  No asthma or wheezing  Musculoskeletal:  [x ] Arthritis, [ ]  Low back pain,  [x ] Joint pain  Hematologic:No history of hypercoagulable state.  No history of easy bleeding.  No history of anemia  Gastrointestinal: No hematochezia or melena,  No gastroesophageal reflux, no trouble swallowing  Urinary: [x ] chronic Kidney disease, [ ]  on HD - [ ]  MWF or [ ]  TTHS, [ ]  Burning with urination, [ ]  Frequent urination, [ ]  Difficulty urinating;   Skin: No rashes  Psychological: No history of anxiety,  No history of depression   Physical Examination  Filed Vitals:   01/08/14 0954  BP: 158/48  Pulse: 62  Height: 5' (1.524 m)   Weight: 146 lb 11.2 oz (66.543 kg)  SpO2: 93%    Body mass index is 28.65 kg/(m^2).  General:  Alert and oriented, no acute distress HEENT:  Normal Neck: Bilateral carotid bruits no JVD Pulmonary: Clear to auscultation bilaterally Cardiac: Regular Rate and Rhythm without murmur Abdomen: Soft, non-tender, non-distended, no mass, no scars Skin: No rash, 3 cm ulceration right medial malleolus less than 1 mm depth some granulation at the base Extremity Pulses:  2+ radial, brachial, femoral, absent popliteal dorsalis pedis, posterior tibial pulses bilaterally Musculoskeletal: No deformity right leg edema trace left leg edema  Neurologic: Upper and lower extremity motor 5/5 and symmetric  DATA:  Patient had bilateral ABIs performed today. ABI on the right is 0.37 left 0.58 previously known to have chronic SFA occlusion and left SFA stenosis from April of 2015.  Patient also had a carotid duplex exam today for bilateral bruits this showed less than 40% internal carotid artery stenosis bilaterally  The patient also had swelling of both lower extremities although the right was worse than the left so we also did a DVT ultrasound showed no evidence of DVT.   ASSESSMENT:  Nonhealing wound right leg that does not appear to be healing spontaneously this time. Known chronic SFA occlusion an ABI of 0.4 on the right side.   PLAN:  Aortogram with bilateral lower extremity runoff possible angioplasty or stenting. Risks benefits possible complications and procedure details of this procedure were described to the patient today including but not limited to bleeding infection contrast reaction vessel injury. She understands and agrees to proceed.  Fabienne Brunsharles Fields, MD [facility] [place] Office: [phone] Pager: [phone]

## 2014-01-17 NOTE — Progress Notes (Signed)
Site area: rt groin. Rt femoral arterial sheath removed by JJohnson. DYoung took over manual hold at 1250. Site Prior to Removal:  Level 0 Pressure Applied For: 60 minutes Manual:   yes Patient Status During Pull:  stable Post Pull Site:  Level 1 bruising Post Pull Instructions Given:  yes Post Pull Pulses Present: yes Dressing Applied:  Paper tape dressing Bedrest begins @ 1330 Comments: rt groin has a pronounced ligament and iliac crest w/ bruising. Dr. Darrick PennaFields paged who called Dr. Hart RochesterLawson to come and look at rt groin who came and saw patient. Instructed to hold manual pressure rt groin 20-30 more minutes. Area unchanged.

## 2014-01-17 NOTE — Interval H&P Note (Signed)
History and Physical Interval Note:  01/17/2014 7:28 AM  [name]  has presented today for surgery, with the diagnosis of pvd  The various methods of treatment have been discussed with the patient and family. After consideration of risks, benefits and other options for treatment, the patient has consented to  Procedure(s): ABDOMINAL AORTAGRAM (N/A) as a surgical intervention .  The patient's history has been reviewed, patient examined, no change in status, stable for surgery.  I have reviewed the patient's chart and labs.  Questions were answered to the patient's satisfaction.     FIELDS,[name]

## 2014-01-17 NOTE — Progress Notes (Signed)
Site area: left groin sheath pulled by JJohnson at [id number] Site Prior to Removal:  Level 0 Pressure Applied For: 20 minutes Manual: yes   Patient Status During Pull:  stable Post Pull Site:  Level 0 Post Pull Instructions Given:  yes Post Pull Pulses Present: yes Dressing Applied:  Paper tape dressing Bedrest begins @  Comments: no complications

## 2014-01-17 NOTE — Op Note (Signed)
Procedure: Abdominal aortogram with bilateral lower extremity runoff, right and left common iliac stent  Preoperative diagnosis: Nonhealing wound right lower extremity  Postoperative diagnosis: Same  Anesthesia: Local with IV sedation  Operative findings: #1 bilateral 7 French sheath groin puncture                                 #2 right common iliac 7 x 24 mm, left common iliac 7 x 39 mm                                #3 AV fistula right lower extremity most likely originating tibioperoneal trunk                                #4 multi segment high-grade stenosis right superficial femoral artery                               #5 in-line flow left lower extremity with several segments of moderate left superficial femoral artery stenosis two-vessel runoff AT and peroneal  Operative details: After obtaining informed consent, the patient was taken to the LAD. The patient was placed in supine position on the Angio table. Both groins were prepped and draped in usual sterile fashion. Next ultrasound was used to certify the left common femoral artery. An introducer needle was used to cannula the left common femoral artery after infiltration of local anesthesia. An 035 versacore wire was threaded in the left iliac system under fluoroscopic guidance. A 5 French sheath was placed over the guidewire in the left common femoral artery. This was thoroughly flushed with a normal saline solution. There were some calcification near the aortic bifurcation and the guidewire would initially not advanced into the abdominal aorta. A 5 French pigtail catheter was placed over the guidewire and advanced up to the iliac bifurcation for additional support. The guidewire still would not pass. The catheter was removed over guidewire exchange for a 5 [place] KMP catheter. This was used to direct the guidewire past the calcification in the left common iliac artery and up into the abdominal aorta. Next a 5 French pigtail catheter was  placed over the guidewire and the abdominal aorta and abdominal aortogram was obtained in the AP projection. The infrarenal abdominal aorta is patent. It is calcified. Left and right renal arteries are patent. There is probably a 40% narrowing of the left renal artery. The right and left common iliac arteries are calcified at their origin. There is a mid 50% stenosis of the left common iliac artery just above the iliac bifurcation. Next the pectoral catheter was pulled down just above the aortic bifurcation and magnified oblique views of the pelvis were obtained. Best seen in the LAO projection there is a high grade calcified stenosis of the left common iliac artery with the plaque extending up into the aorta. This narrows the artery approximately 80%. On the RAO projection there is also some narrowing of the left common iliac origin and the mid segment common iliac stenosis is again noted. The internal/external iliac arteries are patent. Next bilateral lower extremity runoff views were obtained.  In the left lower extremity, the left common femoral and profunda femoris arteries are patent. The left superficial femoral artery is diffusely  diseased with multiple segments of 20-40% stenosis. The origins of all 3 tibial vessels are patent. However the posterior tibial artery occludes in the mid leg. There is two-vessel runoff via the anterior tibial and peroneal arteries. The popliteal artery on the left side is widely patent.  In the right lower extremity, the right common femoral and profunda femoris arteries are patent. The right superficial femoral artery is diffusely diseased similar to the left but more severe. There are multiple segments of 70-80% stenosis and severe calcification. Popliteal artery is patent. There is an AV fistula which appears to be coming off of the right tibioperoneal trunk so the peroneal and posterior tibial arteries are not filled distally at all. There is one-vessel runoff via  anterior tibial artery all the way to the foot.  At this point it was decided to intervene on the right common iliac stenosis as well as the left common iliac stenosis. Since the right common iliac plaque extended up into the aorta I felt that we needed to match up kissing stents to raise the aortic bifurcation. The patient was given 6000 units of intravenous heparin. Initial ACT was much lower than 200. So an additional 5000 units of heparin was eventually given. ACT was confirmed greater than 200 with this. Ultrasound was used to identify the right common femoral artery. An introducer needle was used to cannulate the right common femoral artery without difficulty. An 035 versacore wire was threaded up into the right common iliac system. A 7 French long bright tip sheath was then placed over the guidewire and advanced up to the iliac bifurcation. With some manipulation I was then able to advance the guidewire up into the abdominal aorta. The dilator was used to predilate the lesion. Sheath was thoroughly flushed with heparinized saline. Attention was then turned back to the left groin. The pigtail catheter was removed over a guidewire. The 5 French sheath was exchanged over a guidewire for a long 7 French bright tip sheath. This was advanced up into the mid left common iliac artery. A 7 x 24 mm balloon expandable stent was then placed over the right guidewire and a 7 x 39 balloon-expandable stent placed over the left guidewire.  These were advanced using roadmapping technique up into the distal aorta just above the plaque on the right side. They were then deployed to nominal pressure for 1 minute. The balloon was then deflated. The 5 [place] Coude catheter was then placed back over the guidewire up in the abdominal aorta and a completion arteriogram obtained. This showed wide patency of the left and right common iliac arteries with no residual stenosis and no dissection. The pigtail catheter was removed over a  guidewire. An oblique contrast injection was performed on the right groin to confirm that the needle stick was in the right common femoral artery. The patient tolerated the procedure well and there were no complications. The patient was taken to the holding area in stable condition.  Operative management: The patient will be seen back in follow-up in a few weeks she has had improvement in the ulceration on her right leg. If the wound does not heal within the next 4-6 weeks consideration will need to be given for possible right femoral popliteal bypass and ligation of AV fistula in her right leg.  Ashley Brunsharles Fields, MD [facility] [place] Office: [phone] Pager: [phone]

## 2014-01-17 NOTE — Progress Notes (Signed)
Dr. Hart RochesterLawson by to check in on patient. Bilateral groin sites unchanged. Small skin tear lateral to rt groin site. Dr. Hart RochesterLawson aware. Patient waiting on 2W bed assignment now. Family in to see.

## 2014-01-17 NOTE — Progress Notes (Signed)
Patient ID: [name], female   DOB: [birth year], 80 y.o.   MRN: [id number] Vascular Surgery Progress Note  Subjective: Status post angiography by Dr. Darrick Pennafields earlier today. Required bilateral common femoral puncture sites. Patient developed small hematoma right inguinal area. Compression applied by cath lab staff for 90 minutes at which time hematoma thought to be very stable.  Objective:  Filed Vitals:   01/17/14 1330  BP: 161/88  Pulse: 63  Temp:   Resp: 15    Patient alert and oriented 3 No hematoma left inguinal area Right inguinal area with moderate ecchymosis but no large hematoma palpable. Patient does have small hematoma which was observed and watched by me for over an hour with no change.   Labs:  Recent Labs Lab 01/16/14 1530 01/17/14 0708  CREATININE 1.36* 1.40*    Recent Labs Lab 01/16/14 1530 01/17/14 0708  NA 144 145  K 4.8 4.4  CL 108 108  CO2 24  --   BUN 30* 32*  CREATININE 1.36* 1.40*  GLUCOSE 139* 144*  CALCIUM 8.6*  --     Recent Labs Lab 01/17/14 0708  HGB 10.2*  HCT 30.0*   No results found for this basename: INR,  in the last 168 hours     Imaging: No results found.  Assessment/Plan:  LOS: 0 days  s/p Procedure(s): ABDOMINAL AORTAGRAM ANGIOGRAM EXTREMITY BILATERAL PERCUTANEOUS STENT INTERVENTION  Discussed situation with patient's daughter. Patient is elderly and we will keep her in hospital overnight to observe right inguinal area for expansion of hematoma and plan DC home in a.m.   Josephina GipJames Neriah Brott, MD 01/17/2014 2:38 PM

## 2014-01-17 NOTE — Telephone Encounter (Addendum)
Message copied by Fredrich BirksMILLIKAN, DANA P on Fri Jan 17, 2014  2:22 PM ------      Message from: Sharee PimpleMCCHESNEY, MARILYN K      Created: Fri Jan 17, 2014  9:47 AM      Regarding: Schedule                   ----- Message -----         From: Sherren Kernsharles E Fields, MD         Sent: 01/17/2014   9:43 AM           To: [facility] Charge Pool            Aortogram with bilat runoff      bilat common iliac stent      Ultrasound            She needs a follow up appt in 3-4 weeks.  Please repeat ABI with toe pressures at that appt.            Charles ------  01/17/14: lm for pt re appt, mailed pw, dpm

## 2014-01-18 DIAGNOSIS — I70239 Atherosclerosis of native arteries of right leg with ulceration of unspecified site: Secondary | ICD-10-CM | POA: Diagnosis not present

## 2014-01-18 LAB — COMPREHENSIVE METABOLIC PANEL
ALT: 7 U/L (ref 0–35)
ANION GAP: 12 (ref 5–15)
AST: 17 U/L (ref 0–37)
Albumin: 2.7 g/dL — ABNORMAL LOW (ref 3.5–5.2)
Alkaline Phosphatase: 64 U/L (ref 39–117)
BUN: 26 mg/dL — AB (ref 6–23)
CO2: 23 mEq/L (ref 19–32)
Calcium: 8.7 mg/dL (ref 8.4–10.5)
Chloride: 105 mEq/L (ref 96–112)
Creatinine, Ser: 1.29 mg/dL — ABNORMAL HIGH (ref 0.50–1.10)
GFR calc Af Amer: 43 mL/min — ABNORMAL LOW (ref >90)
GFR calc non Af Amer: 37 mL/min — ABNORMAL LOW (ref >90)
GLUCOSE: 98 mg/dL (ref 70–99)
Potassium: 4.9 mEq/L (ref 3.7–5.3)
Sodium: 140 mEq/L (ref 137–147)
TOTAL PROTEIN: 5.6 g/dL — AB (ref 6.0–8.3)
Total Bilirubin: 0.5 mg/dL (ref 0.3–1.2)

## 2014-01-18 LAB — HEMOGLOBIN A1C
Hgb A1c MFr Bld: 7.5 % — ABNORMAL HIGH (ref <5.7)
MEAN PLASMA GLUCOSE: 169 mg/dL — AB (ref <117)

## 2014-01-18 LAB — GLUCOSE, CAPILLARY
GLUCOSE-CAPILLARY: 138 mg/dL — AB (ref 70–99)
Glucose-Capillary: 119 mg/dL — ABNORMAL HIGH (ref 70–99)
Glucose-Capillary: 157 mg/dL — ABNORMAL HIGH (ref 70–99)
Glucose-Capillary: 112 mg/dL — ABNORMAL HIGH (ref 70–99)

## 2014-01-18 LAB — CBC
HEMATOCRIT: 30.3 % — AB (ref 36.0–46.0)
Hemoglobin: 9.8 g/dL — ABNORMAL LOW (ref 12.0–15.0)
MCH: 30.5 pg (ref 26.0–34.0)
MCHC: 32.3 g/dL (ref 30.0–36.0)
MCV: 94.4 fL (ref 78.0–100.0)
PLATELETS: 160 10*3/uL (ref 150–400)
RBC: 3.21 MIL/uL — AB (ref 3.87–5.11)
RDW: 13.8 % (ref 11.5–15.5)
WBC: 4.7 10*3/uL (ref 4.0–10.5)

## 2014-01-18 MED ORDER — EZETIMIBE 10 MG PO TABS
5.0000 mg | ORAL_TABLET | Freq: Every day | ORAL | Status: DC
  Administered 2014-01-18: 5 mg via ORAL
  Filled 2014-01-18 (×2): qty 0.5

## 2014-01-18 MED ORDER — SIMVASTATIN 10 MG PO TABS
10.0000 mg | ORAL_TABLET | Freq: Every day | ORAL | Status: DC
  Administered 2014-01-18: 10 mg via ORAL
  Filled 2014-01-18 (×2): qty 1

## 2014-01-18 MED ORDER — OXYCODONE-ACETAMINOPHEN 5-325 MG PO TABS: 0.5000 | ORAL_TABLET | Freq: Four times a day (QID) | ORAL | Status: DC | PRN

## 2014-01-18 MED ORDER — NALOXONE HCL 0.4 MG/ML IJ SOLN
INTRAMUSCULAR | Status: AC
Start: 2014-01-18 — End: 2014-01-18
  Administered 2014-01-18: 14:00:00
  Filled 2014-01-18: qty 1

## 2014-01-18 NOTE — Progress Notes (Signed)
Patient ambulated in hallway with RW and RN for about 50 feet. Patient tolerated well and no complaints of pain, etc in groin area. Patient returned to room. RN will continue to monitor. Bradley FerrisBrandie Ginelle Bays RN BSN 01/18/2014 10:29 AM

## 2014-01-18 NOTE — Discharge Summary (Signed)
Discharge Summary    [name] [birth year] 80 y.o. female  [id number]  Admission Date: 01/17/2014  Discharge Date: 01/18/14  Physician: Sherren Kerns, MD  Admission Diagnosis: pvd   HPI:   [name] who presents for evaluation of a right medial malleolus ulcer. This is now been present for one month. She did have a previous ulcer on the same leg 5-6 years ago. This healed spontaneously. She states the ulcer is painful. She does not really describe claudication but is minimally ambulatory. She has been seen by [place] previously for peripheral arterial disease. She was seen by Dr. early in 2011 for the original ulcer which healed spontaneously. Her ABI was approximate 0.5 at that point. She'll systolic in April of this year with a lateral foot ulcer and again had ABIs of 0.5 at that time and the wound healed spontaneously.. Other medical problems include coronary disease, diabetes, diastolic heart failure, hyperlipidemia, hypertension, obesity, CKD 3 all of these are currently stable.  Hospital Course:  The patient was admitted to the hospital and taken to the [facility] lab on 01/17/2014 and underwent: #1 bilateral 7 French sheath groin puncture  #2 right common iliac 7 x 24 mm, left common iliac 7 x 39 mm  #3 AV fistula right lower extremity most likely originating tibioperoneal trunk  #4 multi segment high-grade stenosis right superficial femoral artery  #5 in-line flow left lower extremity with several segments of moderate left superficial femoral artery stenosis two-vessel runoff AT and peroneal    The pt tolerated the procedure well and was transported to the PACU in good condition. That afternoon, she was found to have developed a small hematoma in the right inguinal area.  Compression was applied by the cath lab staff for 90 minutes at which time the hematoma was thought to be very stable.  Given the pt is elderly and after discussion with pt and her daughter, she was  observed overnight for expansion of hematoma.  This is stable on POD 1.  She is moving slowly b/c of pain in her right ankle and right inguinal area.  Her hematoma is stable with no large hematoma over femoral artery.  There is moderate ecchymosis surrounding this area wherre the blood diffused into tissues but this is stable.   Nursing note: Patient was given Percocet for pain prior to being discharged due to c/o severe right groin pain. Patient became unresponsive after being wheeled to her daughter's vehicle. Patient was returned to her room, telemetry applied and vitals taken. Patient would respond to sternal rubs but no other stimuli. Vital signs 84/18, HR 56, 94% on 2L. Rapid Response was called and patient was administered Narcan per protocol. Vitals after Narcan: 108/68, 95% 2L, SB 54. Patient is more awake and alert; talking to her dtr and family friend. Dr. Hart Rochester paged and made aware. No new orders received at this time. RN will continue to monitor  Pt had Percocet 5/325mg  0.5-1 tab every 4 hours for pain listed.  She was given this before discharge as she was complaining of severe pain in her foot.  Narcan was required.  Upon further investigation, the pt states she does not take any narcotics at home.  Since she had been discharged, the pain rx had been given to her daughter.  The RN told the daughter to tear up this prescription as the pt does not tolerate Percocet.  The remainder of the hospital course consisted of increasing mobilization and increasing  intake of solids without difficulty.  CBC    Component Value Date/Time   WBC 4.7 01/18/2014 0456   WBC 5.7 09/26/2013 1410   RBC 3.21* 01/18/2014 0456   RBC 4.02 09/26/2013 1410   HGB 9.8* 01/18/2014 0456   HCT 30.3* 01/18/2014 0456   PLT 160 01/18/2014 0456   MCV 94.4 01/18/2014 0456   MCH 30.5 01/18/2014 0456   MCH 30.6 09/26/2013 1410   MCHC 32.3 01/18/2014 0456   MCHC 33.6 09/26/2013 1410   RDW 13.8 01/18/2014 0456   RDW  13.7 09/26/2013 1410   LYMPHSABS 1.6 12/29/2013 2139   LYMPHSABS 1.7 09/26/2013 1410   MONOABS 0.4 12/29/2013 2139   EOSABS 0.1 12/29/2013 2139   EOSABS 0.2 09/26/2013 1410   BASOSABS 0.0 12/29/2013 2139   BASOSABS 0.0 09/26/2013 1410    BMET    Component Value Date/Time   NA 140 01/18/2014 0456   NA 144 01/16/2014 1530   K 4.9 01/18/2014 0456   CL 105 01/18/2014 0456   CO2 23 01/18/2014 0456   GLUCOSE 98 01/18/2014 0456   GLUCOSE 139* 01/16/2014 1530   BUN 26* 01/18/2014 0456   BUN 30* 01/16/2014 1530   CREATININE 1.29* 01/18/2014 0456   CALCIUM 8.7 01/18/2014 0456   GFRNONAA 37* 01/18/2014 0456   GFRAA 43* 01/18/2014 0456     Discharge Instructions:   The patient is discharged with extensive instructions on wound care and progressive ambulation.  They are instructed not to drive or perform any heavy lifting until returning to see the physician in his office.      Discharge Instructions    Call MD for:  redness, tenderness, or signs of infection (pain, swelling, bleeding, redness, odor or green/yellow discharge around incision site)    Complete by:  As directed      Call MD for:  severe or increased pain, loss or decreased feeling  in affected limb(s)    Complete by:  As directed      Call MD for:  temperature >100.5    Complete by:  As directed      Discharge instructions    Complete by:  As directed   You may shower 24 hours after your procedure.     Driving Restrictions    Complete by:  As directed   No driving for 48 hours     Increase activity slowly    Complete by:  As directed   Walk with assistance use walker or cane as needed     Lifting restrictions    Complete by:  As directed   No lifting for 6 weeks     Resume previous diet    Complete by:  As directed            Discharge Diagnosis:  pvd  Secondary Diagnosis: Patient Active Problem List   Diagnosis Date Noted  . PAD (peripheral artery disease) 01/17/2014  . Atherosclerotic PVD with  ulceration 01/08/2014  . Venous ulcer of ankle 11/27/2013  . Cellulitis of leg, right 11/27/2013  . Ulcer of right ankle 11/27/2013  . 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 Unspecified constipation 07/31/2013  . Atherosclerosis of native arteries of the extremities with intermittent claudication-Right Leg 07/04/2013  . Anxiety state, unspecified 02/21/2013  . DM (diabetes mellitus), type 2 with peripheral vascular complications 01/30/2013  . Allergic rhinitis 01/30/2013  . Need for prophylactic vaccination and inoculation against influenza 01/30/2013  . Diabetes mellitus with nephropathy 07/05/2012  . CAD (coronary artery disease)  of artery bypass graft 07/05/2012  . Pain in limb 06/21/2012  . Occlusion and stenosis of carotid artery without mention of cerebral infarction 06/21/2012  . Peripheral vascular disease, unspecified 06/21/2012  . Arterial occlusive disease 12/22/2011  . URI (upper respiratory infection) 03/28/2011  . Coronary artery disease   . Diastolic heart failure   . Hyperlipidemia   . Hypertension   . Carotid bruit   . Gout   . Obesity   . TIA (transient ischemic attack)   . Chronic kidney disease    Past Medical History  Diagnosis Date  . Coronary artery disease     Remote PCI. Had BMS to RCA and DES stent to Diagonal in Jan. 2012   . Diabetes mellitus     long standing  . Diastolic heart failure     EF 55 to 60% per cath in Jan 2012  . Hyperlipidemia   . Hypertension   . Carotid bruit   . Gout   . Obesity   . TIA (transient ischemic attack)   . Chronic kidney disease     Stage 3  . Ischemic cardiomyopathy     with prior EF of 35%  . Anemia   . Proteinuria   . NSTEMI (non-ST elevated myocardial infarction) Jan 2012    with PCI to RCA and DX per Dr. Excell Seltzerooper  . Carotid artery occlusion   . Stroke 2013    ?  mini    . DVT (deep venous thrombosis)        Medication List    STOP taking these medications        oxyCODONE-acetaminophen 5-325 MG per  tablet  Commonly known as:  PERCOCET/ROXICET      TAKE these medications        amLODipine 5 MG tablet  Commonly known as:  NORVASC  Take 5 mg by mouth daily. For blood pressure     aspirin 81 MG tablet  Take 81 mg by mouth daily. For heart health     carvedilol 3.125 MG tablet  Commonly known as:  COREG  Take 3.125 mg by mouth 2 (two) times daily with a meal. For blood pressure     citalopram 20 MG tablet  Commonly known as:  CELEXA  Take 20 mg by mouth daily.     cloNIDine 0.1 MG tablet  Commonly known as:  CATAPRES  Take 0.1 mg by mouth daily. For blood pressure     colesevelam 625 MG tablet  Commonly known as:  WELCHOL  Take 625 mg by mouth 2 (two) times daily with a meal. For cholesterol     ezetimibe-simvastatin 10-20 MG per tablet  Commonly known as:  VYTORIN  Take 0.5 tablets by mouth at bedtime. For cholesterol     furosemide 40 MG tablet  Commonly known as:  LASIX  Take 1 tablet (40 mg total) by mouth daily.     losartan 50 MG tablet  Commonly known as:  COZAAR  Take 50 mg by mouth daily. For high blood pressure     LOTEMAX 0.5 % ophthalmic suspension  Generic drug:  loteprednol  Place 1 drop into the left eye 3 (three) times daily.     mupirocin ointment 2 %  Commonly known as:  BACTROBAN  Apply 1 application topically daily.     nitroGLYCERIN 0.4 MG SL tablet  Commonly known as:  NITROSTAT  Dissolve one tablet under tongue as needed for chest pain. May repeat as needed every 5  minutes up to 3 doses. Seek medical attention if pain is not relieved after 3 doses     omega-3 acid ethyl esters 1 G capsule  Commonly known as:  LOVAZA  Take 2 g by mouth at bedtime.     omeprazole 20 MG capsule  Commonly known as:  PRILOSEC  Take 1 capsule (20 mg total) by mouth daily.     ondansetron 4 MG tablet  Commonly known as:  ZOFRAN  Take 1 tablet (4 mg total) by mouth every 6 (six) hours.     ONE TOUCH ULTRA TEST test strip  Generic drug:  glucose blood    1 each by Other route See admin instructions. Check blood sugar daily as needed.     PROLENSA 0.07 % Soln  Generic drug:  Bromfenac Sodium  Place 1 drop into the left eye daily.     SANTYL ointment  Generic drug:  collagenase  Apply 1 application topically daily.     timolol 0.5 % ophthalmic solution  Commonly known as:  TIMOPTIC  Place 1 drop into the left eye daily.     trimethoprim-polymyxin b ophthalmic solution  Commonly known as:  POLYTRIM  Place 1 drop into both eyes every 4 (four) hours.       Percocet Rx given (This medication was on her home med rec list) to daughter at discharge, however, pt did not tolerate this and per RN, daughter was told to tear up prescription and not give the pt this medication.  THIS PT SHOULD NOT RECEIVE PERCOCET---SHE DOES NOT TOLERATE THIS MEDICATION.   Disposition: home with daughter  Patient's condition: is Fair  Follow up: 1. Dr. Darrick PennaFields in 2 weeks   Doreatha MassedSamantha Retaj Hilbun, PA-C [facility] [phone] 01/19/2014  9:59 AM

## 2014-01-18 NOTE — Progress Notes (Addendum)
  Progress Note    01/18/2014 9:50 AM 1 Day Post-Op  Subjective:  C/o pain in her groin and ankle  afebrile 110's-170's systolic HR 50's-70's 94% RA  Filed Vitals:   01/18/14 0830  BP: 135/29  Pulse: 70  Temp:   Resp:     Physical Exam: Lungs:  Non labored Incisions:  Right groin with ecchymosis-stable from yesterday   CBC    Component Value Date/Time   WBC 4.7 01/18/2014 0456   WBC 5.7 09/26/2013 1410   RBC 3.21* 01/18/2014 0456   RBC 4.02 09/26/2013 1410   HGB 9.8* 01/18/2014 0456   HCT 30.3* 01/18/2014 0456   PLT 160 01/18/2014 0456   MCV 94.4 01/18/2014 0456   MCH 30.5 01/18/2014 0456   MCH 30.6 09/26/2013 1410   MCHC 32.3 01/18/2014 0456   MCHC 33.6 09/26/2013 1410   RDW 13.8 01/18/2014 0456   RDW 13.7 09/26/2013 1410   LYMPHSABS 1.6 12/29/2013 2139   LYMPHSABS 1.7 09/26/2013 1410   MONOABS 0.4 12/29/2013 2139   EOSABS 0.1 12/29/2013 2139   EOSABS 0.2 09/26/2013 1410   BASOSABS 0.0 12/29/2013 2139   BASOSABS 0.0 09/26/2013 1410    BMET    Component Value Date/Time   NA 140 01/18/2014 0456   NA 144 01/16/2014 1530   K 4.9 01/18/2014 0456   CL 105 01/18/2014 0456   CO2 23 01/18/2014 0456   GLUCOSE 98 01/18/2014 0456   GLUCOSE 139* 01/16/2014 1530   BUN 26* 01/18/2014 0456   BUN 30* 01/16/2014 1530   CREATININE 1.29* 01/18/2014 0456   CALCIUM 8.7 01/18/2014 0456   GFRNONAA 37* 01/18/2014 0456   GFRAA 43* 01/18/2014 0456    INR    Component Value Date/Time   INR 1.09 04/15/2010 1412     Intake/Output Summary (Last 24 hours) at 01/18/14 0950 Last data filed at 01/18/14 0235  Gross per 24 hour  Intake    240 ml  Output   1500 ml  Net  -1260 ml     Assessment:  80 y.o. female is s/p:  Abdominal aortogram with bilateral lower extremity runoff, right and left common iliac stent  1 Day Post-Op  Plan: -pt right groin hematoma stable this am -hgb is stable -will d/c home this am with pain med Rx -she will f/u with Dr. Darrick PennaFields in 2  weeks.    Doreatha MassedSamantha Rhyne, PA-C [facility] [phone] 01/18/2014 9:50 AM  Patient moving slowly because of pain in right ankle and right inguinal area. Hematoma is stable with no large hematoma over femoral artery. There is moderate ecchymosis surrounding this area where the blood diffused into tissues but this is stable  [facility] and hopefully DC home today

## 2014-01-18 NOTE — Progress Notes (Signed)
Patient was given Percocet for pain prior to being discharged due to c/o severe right groin pain. Patient became unresponsive after being wheeled to her daughter's vehicle. Patient was returned to her room, telemetry applied and vitals taken. Patient would respond to sternal rubs but no other stimuli. Vital signs 84/18, HR 56, 94% on 2L. Rapid Response was called and patient was administered Narcan per protocol. Vitals after Narcan: 108/68, 95% 2L, SB 54. Patient is more awake and alert; talking to her dtr and family friend. Dr. Hart RochesterLawson paged and made aware. No new orders received at this time. RN will continue to monitor. Bradley FerrisBrandie Torsten Weniger RN BSN 01/18/2014 2:12 PM

## 2014-01-18 NOTE — Significant Event (Signed)
Rapid Response Event Note  Overview:   Called to see patient for sudden onset of lethargy and unresponsiveness.     Initial Focused Assessment: Upon arrival patient is somnolent, responds to sternal rub only. VS with low BP, HR 50s. Patient had had percocet prior to discharge; RNs pushed Narcan and patient responded. BP came up, as did heart rate.  Interventions: Followup at 1440; patient asleep but arousable.   Event Summary:   at      at          Kristine LineaLackey, Tanaysha Alkins Ann

## 2014-01-19 ENCOUNTER — Encounter (HOSPITAL_COMMUNITY): Payer: Self-pay | Admitting: Family Medicine

## 2014-01-19 DIAGNOSIS — I70239 Atherosclerosis of native arteries of right leg with ulceration of unspecified site: Secondary | ICD-10-CM | POA: Diagnosis not present

## 2014-01-19 LAB — GLUCOSE, CAPILLARY: GLUCOSE-CAPILLARY: 173 mg/dL — AB (ref 70–99)

## 2014-01-19 NOTE — Discharge Instructions (Signed)
DO NOT FILL PERCOCET PRESCRIPTION.  PATIENT DOES NOT TAKE THIS AND DOES NOT TOLERATE THIS MEDICATION.

## 2014-01-19 NOTE — Plan of Care (Signed)
Problem: Discharge Progression Outcomes Goal: Barriers To Progression Addressed/Resolved Outcome: Completed/Met Date Met:  01/19/14 Goal: Discharge plan in place and appropriate Outcome: Completed/Met Date Met:  01/19/14 Goal: Pain controlled with appropriate interventions Outcome: Completed/Met Date Met:  01/19/14 Goal: Hemodynamically stable Outcome: Completed/Met Date Met:  51/76/16 Goal: Complications resolved/controlled Outcome: Completed/Met Date Met:  01/19/14 Goal: Tolerating diet Outcome: Completed/Met Date Met:  01/19/14 Goal: Activity appropriate for discharge plan Outcome: Completed/Met Date Met:  01/19/14 Goal: Tubes and drains discontinued if indicated Outcome: Completed/Met Date Met:  01/19/14 Goal: Staples/sutures removed Outcome: Completed/Met Date Met:  01/19/14 Goal: Steri-Strips applied Outcome: Completed/Met Date Met:  01/19/14

## 2014-01-19 NOTE — Progress Notes (Addendum)
  Progress Note    01/19/2014 7:53 AM 2 Days Post-Op  Subjective:  No complaints-no recollection of yesterday's events.  States she does not take narcotics at home despite this medication being listed on her home med list.  Afebrile VSS  Filed Vitals:   01/19/14 0529  BP: 120/53  Pulse:   Temp:   Resp:     Physical Exam: Lungs:  Non labored Incisions:  Right groin soft - ecchymosis   CBC    Component Value Date/Time   WBC 4.7 01/18/2014 0456   WBC 5.7 09/26/2013 1410   RBC 3.21* 01/18/2014 0456   RBC 4.02 09/26/2013 1410   HGB 9.8* 01/18/2014 0456   HCT 30.3* 01/18/2014 0456   PLT 160 01/18/2014 0456   MCV 94.4 01/18/2014 0456   MCH 30.5 01/18/2014 0456   MCH 30.6 09/26/2013 1410   MCHC 32.3 01/18/2014 0456   MCHC 33.6 09/26/2013 1410   RDW 13.8 01/18/2014 0456   RDW 13.7 09/26/2013 1410   LYMPHSABS 1.6 12/29/2013 2139   LYMPHSABS 1.7 09/26/2013 1410   MONOABS 0.4 12/29/2013 2139   EOSABS 0.1 12/29/2013 2139   EOSABS 0.2 09/26/2013 1410   BASOSABS 0.0 12/29/2013 2139   BASOSABS 0.0 09/26/2013 1410    BMET    Component Value Date/Time   NA 140 01/18/2014 0456   NA 144 01/16/2014 1530   K 4.9 01/18/2014 0456   CL 105 01/18/2014 0456   CO2 23 01/18/2014 0456   GLUCOSE 98 01/18/2014 0456   GLUCOSE 139* 01/16/2014 1530   BUN 26* 01/18/2014 0456   BUN 30* 01/16/2014 1530   CREATININE 1.29* 01/18/2014 0456   CALCIUM 8.7 01/18/2014 0456   GFRNONAA 37* 01/18/2014 0456   GFRAA 43* 01/18/2014 0456    INR    Component Value Date/Time   INR 1.09 04/15/2010 1412     Intake/Output Summary (Last 24 hours) at 01/19/14 0753 Last data filed at 01/18/14 1137  Gross per 24 hour  Intake      3 ml  Output      0 ml  Net      3 ml     Assessment:  80 y.o. female is s/p:  Abdominal aortogram with bilateral lower extremity runoff, right and left common iliac stent  2 Days Post-Op  Plan: -did not tolerate narcotics yesterday.  She does have this listed  on her home med list, but states she does not take any narcotics at home.  I will remove this from her med list. -she is doing well this am.  Will d/c home today. -f/u with Dr. Darrick PennaFields in a couple of weeks.    Doreatha MassedSamantha Rhyne, PA-C [facility] [phone] 01/19/2014 7:53 AM    Right inguinal area is stable and unchanged today with 3+ femoral pulse palpable. Have discontinued narcotics and plan DC home today Return to see Dr. Darrick PennaFields in 2 weeks

## 2014-01-20 ENCOUNTER — Telehealth: Payer: Self-pay | Admitting: *Deleted

## 2014-01-20 ENCOUNTER — Telehealth: Payer: Self-pay | Admitting: Vascular Surgery

## 2014-01-20 LAB — GLUCOSE, CAPILLARY: GLUCOSE-CAPILLARY: 110 mg/dL — AB (ref 70–99)

## 2014-01-20 NOTE — Telephone Encounter (Signed)
-----   Message from Sharee PimpleMarilyn K McChesney, RN sent at 01/17/2014  4:59 PM EDT ----- Regarding: Schedule   ----- Message -----    From: Lars MageEmma M Collins, PA-C    Sent: 01/17/2014   2:47 PM      To: [facility] Charge Pool  F/U with Dr. Darrick PennaFields in 2 weeks for wound check.  S/P angiogram observed over night secondary to hematoma at groin stick site.

## 2014-01-20 NOTE — Telephone Encounter (Signed)
Patient can't tolerate any pain meds per hospital notes ( had to get Narcan to reverse Percocet respiratory depression) I talked to daughter Arna Mediciora, who will give OTC meds as per Dr. Volney PresserPandey's instruction. Patient has high BUN and Creatinine levels so she has to limit Tylenol and Ibuprofen. Patient seems to be resting comfortably at this time per daughter, she is afebrile and has no operative site drainage or erythema. Some bruising but wound is clean and dry per Arna MediciNora.

## 2014-01-20 NOTE — Telephone Encounter (Signed)
Spoke with patient, dpm °

## 2014-01-20 NOTE — Telephone Encounter (Signed)
-----   Message from Fredrich Birksana P Millikan sent at 01/20/2014  3:25 PM EST ----- Regarding: Pain Meds Patient is still having a lot of pain and would like to see if we can call something in to her pharmacy.  She can be reached at [phone] (I actually spoke with Bill, as the pt could not hear me)  Thanks! Annabelle Harmanana

## 2014-01-22 ENCOUNTER — Ambulatory Visit: Payer: PRIVATE HEALTH INSURANCE | Admitting: Internal Medicine

## 2014-01-22 ENCOUNTER — Encounter (HOSPITAL_BASED_OUTPATIENT_CLINIC_OR_DEPARTMENT_OTHER): Payer: PRIVATE HEALTH INSURANCE | Attending: General Surgery

## 2014-01-22 DIAGNOSIS — E13622 Other specified diabetes mellitus with other skin ulcer: Secondary | ICD-10-CM | POA: Diagnosis present

## 2014-01-22 DIAGNOSIS — L97319 Non-pressure chronic ulcer of right ankle with unspecified severity: Secondary | ICD-10-CM | POA: Diagnosis not present

## 2014-01-23 ENCOUNTER — Other Ambulatory Visit: Payer: Self-pay | Admitting: Internal Medicine

## 2014-01-23 ENCOUNTER — Other Ambulatory Visit: Payer: Self-pay | Admitting: Nurse Practitioner

## 2014-01-27 ENCOUNTER — Ambulatory Visit: Payer: PRIVATE HEALTH INSURANCE | Admitting: Nurse Practitioner

## 2014-01-27 ENCOUNTER — Telehealth: Payer: Self-pay | Admitting: *Deleted

## 2014-01-27 NOTE — Telephone Encounter (Signed)
-----   Message from Sharon SellerJessica K Eubanks, NP sent at 01/27/2014 12:29 PM EST ----- Please follow up with pt as to why she did not follow up last week; needs follow up from recent increase in lasix

## 2014-01-29 DIAGNOSIS — E13622 Other specified diabetes mellitus with other skin ulcer: Secondary | ICD-10-CM | POA: Diagnosis not present

## 2014-01-29 DIAGNOSIS — L97319 Non-pressure chronic ulcer of right ankle with unspecified severity: Secondary | ICD-10-CM | POA: Diagnosis not present

## 2014-01-30 ENCOUNTER — Ambulatory Visit: Payer: PRIVATE HEALTH INSURANCE | Admitting: Nurse Practitioner

## 2014-02-05 ENCOUNTER — Encounter: Payer: Self-pay | Admitting: Vascular Surgery

## 2014-02-05 DIAGNOSIS — L97319 Non-pressure chronic ulcer of right ankle with unspecified severity: Secondary | ICD-10-CM | POA: Diagnosis not present

## 2014-02-05 DIAGNOSIS — E13622 Other specified diabetes mellitus with other skin ulcer: Secondary | ICD-10-CM | POA: Diagnosis not present

## 2014-02-06 ENCOUNTER — Encounter: Payer: Self-pay | Admitting: Vascular Surgery

## 2014-02-06 ENCOUNTER — Ambulatory Visit (INDEPENDENT_AMBULATORY_CARE_PROVIDER_SITE_OTHER): Payer: PRIVATE HEALTH INSURANCE | Admitting: Vascular Surgery

## 2014-02-06 VITALS — BP 121/32 | HR 58 | Temp 98.2°F | Resp 14 | Ht 60.0 in | Wt 141.4 lb

## 2014-02-06 DIAGNOSIS — I7025 Atherosclerosis of native arteries of other extremities with ulceration: Secondary | ICD-10-CM

## 2014-02-06 NOTE — Progress Notes (Signed)
Patient is an 80 year old female returns for follow-up today. She underwent bilateral common iliac stenting on October 30. 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 She did have a postprocedure hematoma in the groin but this is now resolved. She has been seen at the wound center for intermittent debridement of her right medial malleolus ulcer. She states that the ulcer is painful.  Review of systems: She denies fever or chills. She denies claudication.  Physical exam:  Filed Vitals:   02/06/14 1151  BP: 121/32  Pulse: 58  Temp: 98.2 F (36.8 C)  TempSrc: Oral  Resp: 14  Height: 5' (1.524 m)  Weight: 141 lb 6.4 oz (64.139 kg)  SpO2: 99%    Extremities: 2+ femoral pulses bilaterally no hematoma no mass in the groin absent popliteal and pedal pulses, erythema right lower extremity gaiter area with 3 x 3 cm ulceration over the right medial malleolus with some granulation tissue at the base ulcer is less than 1 mm in depth  Assessment:  Patient with mixed arterial and venous disease right lower extremity. I believe the patient needs more compression on her right lower extremity to improve the ulcer in her right leg. She is not a very good candidate for operation and femoropopliteal bypass. We could consider angioplasty of her right superficial femoral artery however, if we can get her ulcer to heal with conservative measures that would be best. She currently seems to be losing ground. I discussed with her today placing a new Unna boot on her right leg for compression therapy. She states that she did not tolerate this very well in the past but she is willing to try this. She has had ulcers spontaneously heal in the past. With the improvement in flow from her recent iliac stent she should have reasonable perfusion to the right lower extremity. We will place her in a boot  Unna boot today. She will have this changed weekly for 4 weeks. If the ulcer has not made dramatic improvement at that time we will consider returning to the Cath Lab for possible angioplasty of her right superficial femoral artery.  Fabienne Brunsharles Fields, MD [facility] [place] Office: [phone] Pager: [phone]

## 2014-02-07 ENCOUNTER — Other Ambulatory Visit: Payer: PRIVATE HEALTH INSURANCE

## 2014-02-10 ENCOUNTER — Telehealth: Payer: Self-pay

## 2014-02-10 ENCOUNTER — Ambulatory Visit (INDEPENDENT_AMBULATORY_CARE_PROVIDER_SITE_OTHER): Payer: PRIVATE HEALTH INSURANCE | Admitting: Internal Medicine

## 2014-02-10 ENCOUNTER — Encounter: Payer: Self-pay | Admitting: Family

## 2014-02-10 ENCOUNTER — Ambulatory Visit (INDEPENDENT_AMBULATORY_CARE_PROVIDER_SITE_OTHER): Payer: Self-pay | Admitting: Family

## 2014-02-10 ENCOUNTER — Encounter: Payer: Self-pay | Admitting: Internal Medicine

## 2014-02-10 VITALS — BP 144/50 | HR 58 | Temp 94.7°F | Resp 16 | Ht 60.0 in | Wt 132.0 lb

## 2014-02-10 VITALS — BP 118/68 | HR 53 | Temp 97.6°F | Resp 10 | Ht 60.0 in | Wt 132.0 lb

## 2014-02-10 DIAGNOSIS — N183 Chronic kidney disease, stage 3 unspecified: Secondary | ICD-10-CM | POA: Insufficient documentation

## 2014-02-10 DIAGNOSIS — E1159 Type 2 diabetes mellitus with other circulatory complications: Secondary | ICD-10-CM

## 2014-02-10 DIAGNOSIS — M79604 Pain in right leg: Secondary | ICD-10-CM

## 2014-02-10 DIAGNOSIS — E785 Hyperlipidemia, unspecified: Secondary | ICD-10-CM

## 2014-02-10 DIAGNOSIS — I9589 Other hypotension: Secondary | ICD-10-CM

## 2014-02-10 DIAGNOSIS — E1151 Type 2 diabetes mellitus with diabetic peripheral angiopathy without gangrene: Secondary | ICD-10-CM

## 2014-02-10 NOTE — Patient Instructions (Signed)
Peripheral Vascular Disease Peripheral Vascular Disease (PVD), also called Peripheral Arterial Disease (PAD), is a circulation problem caused by cholesterol (atherosclerotic plaque) deposits in the arteries. PVD commonly occurs in the lower extremities (legs) but it can occur in other areas of the body, such as your arms. The cholesterol buildup in the arteries reduces blood flow which can cause pain and other serious problems. The presence of PVD can place a person at risk for Coronary Artery Disease (CAD).  CAUSES  Causes of PVD can be many. It is usually associated with more than one risk factor such as:   High Cholesterol.  Smoking.  Diabetes.  Lack of exercise or inactivity.  High blood pressure (hypertension).  Obesity.  Family history. SYMPTOMS   When the lower extremities are affected, patients with PVD may experience:  Leg pain with exertion or physical activity. This is called INTERMITTENT CLAUDICATION. This may present as cramping or numbness with physical activity. The location of the pain is associated with the level of blockage. For example, blockage at the abdominal level (distal abdominal aorta) may result in buttock or hip pain. Lower leg arterial blockage may result in calf pain.  As PVD becomes more severe, pain can develop with less physical activity.  In people with severe PVD, leg pain may occur at rest.  Other PVD signs and symptoms:  Leg numbness or weakness.  Coldness in the affected leg or foot, especially when compared to the other leg.  A change in leg color.  Patients with significant PVD are more prone to ulcers or sores on toes, feet or legs. These may take longer to heal or may reoccur. The ulcers or sores can become infected.  If signs and symptoms of PVD are ignored, gangrene may occur. This can result in the loss of toes or loss of an entire limb.  Not all leg pain is related to PVD. Other medical conditions can cause leg pain such  as:  Blood clots (embolism) or Deep Vein Thrombosis.  Inflammation of the blood vessels (vasculitis).  Spinal stenosis. DIAGNOSIS  Diagnosis of PVD can involve several different types of tests. These can include:  Pulse Volume Recording Method (PVR). This test is simple, painless and does not involve the use of X-rays. PVR involves measuring and comparing the blood pressure in the arms and legs. An ABI (Ankle-Brachial Index) is calculated. The normal ratio of blood pressures is 1. As this number becomes smaller, it indicates more severe disease.  < 0.95 - indicates significant narrowing in one or more leg vessels.  <0.8 - there will usually be pain in the foot, leg or buttock with exercise.  <0.4 - will usually have pain in the legs at rest.  <0.25 - usually indicates limb threatening PVD.  Doppler detection of pulses in the legs. This test is painless and checks to see if you have a pulses in your legs/feet.  A dye or contrast material (a substance that highlights the blood vessels so they show up on x-ray) may be given to help your caregiver better see the arteries for the following tests. The dye is eliminated from your body by the kidney's. Your caregiver may order blood work to check your kidney function and other laboratory values before the following tests are performed:  Magnetic Resonance Angiography (MRA). An MRA is a picture study of the blood vessels and arteries. The MRA machine uses a large magnet to produce images of the blood vessels.  Computed Tomography Angiography (CTA). A CTA   is a specialized x-ray that looks at how the blood flows in your blood vessels. An IV may be inserted into your arm so contrast dye can be injected.  Angiogram. Is a procedure that uses x-rays to look at your blood vessels. This procedure is minimally invasive, meaning a small incision (cut) is made in your groin. A small tube (catheter) is then inserted into the artery of your groin. The catheter  is guided to the blood vessel or artery your caregiver wants to examine. Contrast dye is injected into the catheter. X-rays are then taken of the blood vessel or artery. After the images are obtained, the catheter is taken out. TREATMENT  Treatment of PVD involves many interventions which may include:  Lifestyle changes:  Quitting smoking.  Exercise.  Following a low fat, low cholesterol diet.  Control of diabetes.  Foot care is very important to the PVD patient. Good foot care can help prevent infection.  Medication:  Cholesterol-lowering medicine.  Blood pressure medicine.  Anti-platelet drugs.  Certain medicines may reduce symptoms of Intermittent Claudication.  Interventional/Surgical options:  Angioplasty. An Angioplasty is a procedure that inflates a balloon in the blocked artery. This opens the blocked artery to improve blood flow.  Stent Implant. A wire mesh tube (stent) is placed in the artery. The stent expands and stays in place, allowing the artery to remain open.  Peripheral Bypass Surgery. This is a surgical procedure that reroutes the blood around a blocked artery to help improve blood flow. This type of procedure may be performed if Angioplasty or stent implants are not an option. SEEK IMMEDIATE MEDICAL CARE IF:   You develop pain or numbness in your arms or legs.  Your arm or leg turns cold, becomes blue in color.  You develop redness, warmth, swelling and pain in your arms or legs. MAKE SURE YOU:   Understand these instructions.  Will watch your condition.  Will get help right away if you are not doing well or get worse. Document Released: 04/14/2004 Document Revised: 05/30/2011 Document Reviewed: 03/11/2008 Exit[employer] Patient Information 2015 [employer]. This information is not intended to replace advice given to you by your health care provider. Make sure you discuss any questions you have with your health care provider.   Stasis Ulcer Stasis  ulcers occur in the legs when the circulation is damaged. An ulcer may look like a small hole in the skin.  CAUSES Stasis ulcers occur because your veins do not work properly. Veins have valves that help the blood return to the heart. If these valves do not work right, blood flows backwards and backs up into the veins near the skin. This condition causes the veins to become larger because of increased pressure and may lead to a stasis ulcer. SYMPTOMS   Shallow (superficial) sore on the leg.  Clear drainage or weeping from the sore.  Leg pain or a feeling of heaviness. This may be worse at the end of the day.  Leg swelling.  Skin color changes. DIAGNOSIS  Your caregiver will make a diagnosis by examining your leg. Your caregiver may order tests such as an ultrasound or other studies to evaluate the blood flow of the leg. HOME CARE INSTRUCTIONS   Do not stand or sit in one position for long periods of time. Do not sit with your legs crossed. Rest with your legs raised during the day. If possible, it is best if you can elevate your legs above your heart for 30 minutes, 3   to 4 times a day.  Wear elastic stockings or support hose. Do not wear other tight encircling garments around legs, pelvis, or waist. This causes increased pressure in your veins. If your caregiver has applied compressive medicated wraps, use them as instructed.  Walk as much as possible to increase blood flow. If you are taking long rides in a car or plane, take a break to walk around every 2 hours. If not already on aspirin, take a baby aspirin before long trips unless you have medical reasons that prohibit this.  Raise the foot of your bed at night with 2-inch blocks if approved by your caregiver. This may not be desirable if you have heart failure or breathing problems.  If you get a cut in the skin over the vein and the vein bleeds, lie down with your leg raised and gently clean the area with a clean cloth. Apply pressure  on the cut until the bleeding stops. Then place a dressing on the cut. See your caregiver if it continues to bleed or needs stitches. Also, see your caregiver if you develop an infection.Signs of an infection include a fever, redness, increased pain, and drainage of pus.  If your caregiver has given you a follow-up appointment, it is very important to keep that appointment. Not keeping the appointment could result in a chronic or permanent injury, pain, and disability. If there is any problem keeping the appointment, call your caregiver for assistance. SEEK IMMEDIATE MEDICAL CARE IF:  The ulcer area starts to break down.  You have pain, redness, tenderness, pus, or hard swelling in your leg over a vein or near the ulcer.  Your leg pain is uncomfortable.  You develop an unexplained fever.  You develop chest pain or shortness of breath. Document Released: 11/30/2000 Document Revised: 05/30/2011 Document Reviewed: 06/27/2010 Exit[employer] Patient Information 2015 [employer]. This information is not intended to replace advice given to you by your health care provider. Make sure you discuss any questions you have with your health care provider.     

## 2014-02-10 NOTE — Progress Notes (Signed)
[facility] [place] HISTORY AND PHYSICAL -PAD  History of Present Illness [name] is a 80 y.o. female patient of Dr. Darrick PennaFields who is s/p bilateral common iliac stenting on January 17 2014. 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 She did have a postprocedure hematoma in the groin but this is now resolved. She has been seen at the wound center for intermittent debridement of her right medial malleolus ulcer. She states that the ulcer is painful.  She was seen by Dr. Darrick PennaFields on 02/06/14. 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 Dr Darrick PennaFields believed the patient needed more compression on her right lower extremity to improve the ulcer in her right leg. She is not a very good candidate for operation and femoropopliteal bypass. We could consider angioplasty of her right superficial femoral artery however, if we can get her ulcer to heal with conservative measures that would be best. She currently seems to be losing ground. I discussed with her today placing a new Unna boot on her right leg for compression therapy. She states that she did not tolerate this very well in the past but she is willing to try this. She has had ulcers spontaneously heal in the past. With the improvement in flow from her recent iliac stent she should have reasonable perfusion to the right lower extremity. An Radio broadcast assistantUnna boot that day. She will have this changed weekly for 4 weeks. If the ulcer has not made dramatic improvement at that time we will consider returning to the Cath Lab for possible angioplasty of her right superficial femoral artery.  She returns today with c/o pain in right heel that started 4 days after the Unna boot was applied. She received a great deal of pain relief for four days after the Unna boot was applied, then started  having severe burning pain. However, the daughter and pt state the wound looks significantly improved in the last four days. Daughter states the pt has a follow up appointment with Dr. Darrick PennaFields scheduled for Dec. 3.     Pt Diabetic: Yes, now diet controlled, meds stopped 2 months ago by her PCP Pt smoker: former smoker, quit 1970  Pt meds include:  Statin :Yes  Betablocker: Yes  ASA: Yes  Other anticoagulants/antiplatelets: no    Past Medical History  Diagnosis Date  . Coronary artery disease     Remote PCI. Had BMS to RCA and DES stent to Diagonal in Jan. 2012   . Diabetes mellitus     long standing  . Diastolic heart failure     EF 55 to 60% per cath in Jan 2012  . Hyperlipidemia   . Hypertension   . Carotid bruit   . Gout   . Obesity   . TIA (transient ischemic attack)   . Chronic kidney disease     Stage 3  . Ischemic cardiomyopathy     with prior EF of 35%  . Anemia   . Proteinuria   . NSTEMI (non-ST elevated myocardial infarction) Jan 2012    with PCI to RCA and DX per Dr. Excell Seltzerooper  . Carotid artery occlusion   . Stroke 2013    ?  mini    . DVT (deep venous thrombosis)    Past Surgical History  Procedure Laterality Date  . Appendectomy    .  Tonsillectomy    . Left ankle fusion    . Distal rca  04/05/2010    stent  . Fracture surgery Right 2005    bilateral ankles     Social History History  Substance Use Topics  . Smoking status: Former Smoker    Quit date: 03/21/1968  . Smokeless tobacco: Never Used  . Alcohol Use: No    Family History History reviewed. No pertinent family history.  Past Surgical History  Procedure Laterality Date  . Appendectomy    . Tonsillectomy    . Left ankle fusion    . Distal rca  04/05/2010    stent  . Fracture surgery Right 2005    bilateral ankles     Allergies  Allergen Reactions  . Sulfa Antibiotics Other (See Comments)    Tongue swells  . Percocet [Oxycodone-Acetaminophen] Other (See Comments)  .  Penicillins Hives    Current Outpatient Prescriptions  Medication Sig Dispense Refill  . aspirin 81 MG tablet Take 81 mg by mouth daily. For heart health    . Bromfenac Sodium (PROLENSA) 0.07 % SOLN Place 1 drop into the left eye daily.     . carvedilol (COREG) 3.125 MG tablet Take 3.125 mg by mouth 2 (two) times daily with a meal. For blood pressure    . cetirizine (ZYRTEC) 5 MG tablet TAKE 1 TABLET BY MOUTH ONCE DAILY AT BEDTIME 30 tablet 5  . citalopram (CELEXA) 20 MG tablet Take 20 mg by mouth daily.    . colesevelam (WELCHOL) 625 MG tablet Take 625 mg by mouth 2 (two) times daily with a meal. For cholesterol    . furosemide (LASIX) 40 MG tablet Take 1 tablet (40 mg total) by mouth daily. 30 tablet 2  . glucose blood (ONE TOUCH ULTRA TEST) test strip 1 each by Other route See admin instructions. Check blood sugar daily as needed.    Marland Kitchen. losartan (COZAAR) 50 MG tablet Take 25 mg by mouth daily.    Marland Kitchen. LOTEMAX 0.5 % ophthalmic suspension Place 1 drop into the left eye 3 (three) times daily.     . mupirocin ointment (BACTROBAN) 2 % Apply 1 application topically daily.     . nitroGLYCERIN (NITROSTAT) 0.4 MG SL tablet Dissolve one tablet under tongue as needed for chest pain. May repeat as needed every 5 minutes up to 3 doses. Seek medical attention if pain is not relieved after 3 doses 25 tablet 11  . omega-3 acid ethyl esters (LOVAZA) 1 G capsule TAKE 2 CAPSULES BY MOUTH EVERY NIGHT AT BEDTIME FOR 30 DAYS 30 capsule 1  . omeprazole (PRILOSEC) 20 MG capsule Take 1 capsule (20 mg total) by mouth daily. 30 capsule 3  . ondansetron (ZOFRAN) 4 MG tablet Take 1 tablet (4 mg total) by mouth every 6 (six) hours. 12 tablet 0  . SANTYL ointment Apply 1 application topically daily.     . timolol (TIMOPTIC) 0.5 % ophthalmic solution Place 1 drop into the left eye daily.     Marland Kitchen. trimethoprim-polymyxin b (POLYTRIM) ophthalmic solution Place 1 drop into both eyes every 4 (four) hours.     Marland Kitchen. VYTORIN 10-20 MG per  tablet TAKE 1/2 TABLET BY MOUTH EVERY NIGHT AT BEDTIME FOR 30 DAYS 30 tablet 1   No current facility-administered medications for this visit.    ROS: See HPI for pertinent positives and negatives.   Physical Examination  Filed Vitals:   02/10/14 1241  BP: 144/50  Pulse: 58  Temp:  94.7 F (34.8 C)  TempSrc: Oral  Resp: 16  Height: 5' (1.524 m)  Weight: 132 lb (59.875 kg)  SpO2: 98%   Body mass index is 25.78 kg/(m^2).  General: A&O x 3, WDWN,  Gait: slow, deliberate  Eyes: PERRLA,  Pulmonary: CTAB, without wheezes , rales or rhonchi  Cardiac: regular Rythm , with mild murmur   Carotid Bruits  Left  Right    Positive  Positive   Aorta: not palpable  Radial pulses: 2+ and equal  VASCULAR EXAM:  Extremities without ischemic changes  without Gangrene; with shallow ulcer at right medial maleolus.   LE Pulses  LEFT  RIGHT   FEMORAL  2+palpable  2+palpable   POPLITEAL  not palpable  not palpable   POSTERIOR TIBIAL  not palpable  not palpable   DORSALIS PEDIS  ANTERIOR TIBIAL  Faintly palpable  Faintly palpable   Abdomen: soft, NT, no palpable masses.  Skin: no rashes, see extremities.  Musculoskeletal: no muscle wasting or atrophy.  Neurologic: A&O X 3; Appropriate Affect ; SENSATION: normal; MOTOR FUNCTION: moving all extremities equally, motor strength 5/5 in upper extremities, 3/5 in lower extremities. Speech is fluent/normal. CN 2-12 intact except for hard of hearing.    ASSESSMENT: NNENNA MEADOR is a 80 y.o. female who is s/p bilateral common iliac stenting on January 17 2014. Unna boot was placed on her right lower leg on 02/06/14 which provided significant pain relief and improved healing in the right medial malleolus ulcer until she started having severe pain in the right ankle last night. Discussed the above with Dr. Myra Gianotti.   PLAN:  I discussed in depth with the patient the nature of atherosclerosis, and emphasized the  importance of maximal medical management including strict control of blood pressure, blood glucose, and lipid levels, obtaining regular exercise, and continued cessation of smoking.  The patient is aware that without maximal medical management the underlying atherosclerotic disease process will progress, limiting the benefit of any interventions.  New Unna boot dressing was applied to right ankle.   Based on the patient's vascular studies and examination, and after discussing with Dr. Myra Gianotti,  pt will return to clinic on 02/20/14 as scheduled and see Dr. Darrick Penna.  The patient was given information about PAD including signs, symptoms, treatment, what symptoms should prompt the patient to seek immediate medical care, and risk reduction measures to take.  Charisse March, RN, MSN, FNP-C [facility] Phone: [phone]  Clinic MD: Myra Gianotti  02/10/2014 12:57 PM

## 2014-02-10 NOTE — Progress Notes (Signed)
Patient ID: [name], female   DOB: 1927-08-22, 80 y.o.   MRN: [id number]   Location:  [facility]   Allergies  Allergen Reactions  . Sulfa Antibiotics Other (See Comments)    Tongue swells  . Percocet [Oxycodone-Acetaminophen] Other (See Comments)  . Penicillins Hives    Chief Complaint  Patient presents with  . Medical Management of Chronic Issues    3 month follow-up, patient c/o low b/p (holding ALL blood pressure medications x 4 days per self) 113/49, 86/44,94/49,and 82/41    HPI: Patient is a 80 y.o. white female with diabetes with peripheral vascular complications, htn, hyperlipidemia, diastolic chf, PAD, CAD s/p CABG, seen in the office today for med mgt chronic diseases--this is her physician visit.  She follows with Shanda BumpsJessica, NP.    They are in a hurry to get to her vascular surgery appt across town and she is having severe pain in her right leg.  We gave her two extra strength tylenol. Her right leg is in a soft cast.  She has had a right medial malleolus ulcer that is painful.  She underwent bilateral common iliac stenting on 10/30 for bilateral superficial femoral artery occlusive disease as well as an AV fistula in her RLE.  She had a hematoma that resolved in her groin.  Vascular indicates that her disease is mixed arterial and venous in her RLE.  She now has an unnaboot on the right leg that is changed weekly for 4 wks.  The plan is to take her to the cath lab for angioplasty of the right SFA if she does not make dramatic improvement. Has not taken any bp meds for 4 days.  130s a little bit ago.  Now 128/55.    Review of Systems:  Review of Systems  HENT: Negative for congestion.   Respiratory: Negative for shortness of breath.   Cardiovascular: Negative for chest pain and leg swelling.       No edema right now with lasix and right unnaboot  Gastrointestinal: Negative for constipation.  Genitourinary: Negative for dysuria.    Musculoskeletal: Negative for falls.       Right leg pain     Past Medical History  Diagnosis Date  . Coronary artery disease     Remote PCI. Had BMS to RCA and DES stent to Diagonal in Jan. 2012   . Diabetes mellitus     long standing  . Diastolic heart failure     EF 55 to 60% per cath in Jan 2012  . Hyperlipidemia   . Hypertension   . Carotid bruit   . Gout   . Obesity   . TIA (transient ischemic attack)   . Chronic kidney disease     Stage 3  . Ischemic cardiomyopathy     with prior EF of 35%  . Anemia   . Proteinuria   . NSTEMI (non-ST elevated myocardial infarction) Jan 2012    with PCI to RCA and DX per Dr. Excell Seltzerooper  . Carotid artery occlusion   . Stroke 2013    ?  mini    . DVT (deep venous thrombosis)     Past Surgical History  Procedure Laterality Date  . Appendectomy    . Tonsillectomy    . Left ankle fusion    . Distal rca  04/05/2010    stent  . Fracture surgery Right 2005    bilateral ankles     Social History:  reports that she quit smoking about 45 years ago. She has never used smokeless tobacco. She reports that she does not drink alcohol or use illicit drugs.  No family history on file.  Medications: Patient's Medications  New Prescriptions   No medications on file  Previous Medications   AMLODIPINE (NORVASC) 5 MG TABLET    Take 5 mg by mouth daily. For blood pressure   ASPIRIN 81 MG TABLET    Take 81 mg by mouth daily. For heart health   BROMFENAC SODIUM (PROLENSA) 0.07 % SOLN    Place 1 drop into the left eye daily.    CARVEDILOL (COREG) 3.125 MG TABLET    Take 3.125 mg by mouth 2 (two) times daily with a meal. For blood pressure   CETIRIZINE (ZYRTEC) 5 MG TABLET    TAKE 1 TABLET BY MOUTH ONCE DAILY AT BEDTIME   CITALOPRAM (CELEXA) 20 MG TABLET    Take 20 mg by mouth daily.   CLONIDINE (CATAPRES) 0.1 MG TABLET    TAKE 1 TABLET BY MOUTH EVERY DAY   COLESEVELAM (WELCHOL) 625 MG TABLET    Take 625 mg by mouth 2 (two) times daily with a  meal. For cholesterol   FUROSEMIDE (LASIX) 40 MG TABLET    Take 1 tablet (40 mg total) by mouth daily.   GLUCOSE BLOOD (ONE TOUCH ULTRA TEST) TEST STRIP    1 each by Other route See admin instructions. Check blood sugar daily as needed.   LOSARTAN (COZAAR) 50 MG TABLET    Take 50 mg by mouth daily. For high blood pressure   LOTEMAX 0.5 % OPHTHALMIC SUSPENSION    Place 1 drop into the left eye 3 (three) times daily.    MUPIROCIN OINTMENT (BACTROBAN) 2 %    Apply 1 application topically daily.    NITROGLYCERIN (NITROSTAT) 0.4 MG SL TABLET    Dissolve one tablet under tongue as needed for chest pain. May repeat as needed every 5 minutes up to 3 doses. Seek medical attention if pain is not relieved after 3 doses   OMEGA-3 ACID ETHYL ESTERS (LOVAZA) 1 G CAPSULE    TAKE 2 CAPSULES BY MOUTH EVERY NIGHT AT BEDTIME FOR 30 DAYS   OMEPRAZOLE (PRILOSEC) 20 MG CAPSULE    Take 1 capsule (20 mg total) by mouth daily.   ONDANSETRON (ZOFRAN) 4 MG TABLET    Take 1 tablet (4 mg total) by mouth every 6 (six) hours.   SANTYL OINTMENT    Apply 1 application topically daily.    TIMOLOL (TIMOPTIC) 0.5 % OPHTHALMIC SOLUTION    Place 1 drop into the left eye daily.    TRIMETHOPRIM-POLYMYXIN B (POLYTRIM) OPHTHALMIC SOLUTION    Place 1 drop into both eyes every 4 (four) hours.    VYTORIN 10-20 MG PER TABLET    TAKE 1/2 TABLET BY MOUTH EVERY NIGHT AT BEDTIME FOR 30 DAYS  Modified Medications   No medications on file  Discontinued Medications   CLONIDINE (CATAPRES) 0.1 MG TABLET    Take 0.1 mg by mouth daily. For blood pressure   EZETIMIBE-SIMVASTATIN (VYTORIN) 10-20 MG PER TABLET    Take 0.5 tablets by mouth at bedtime. For cholesterol   OMEGA-3 ACID ETHYL ESTERS (LOVAZA) 1 G CAPSULE    Take 2 g by mouth at bedtime.     Physical Exam: Filed Vitals:   02/10/14 1102  BP: 118/68  Pulse: 53  Temp: 97.6 F (36.4 C)  TempSrc: Oral  Resp: 10  Height:  5' (1.524 m)  Weight: 132 lb (59.875 kg)  SpO2: 98%  Physical Exam    Constitutional: She appears well-developed and well-nourished.  Cardiovascular: Normal rate, regular rhythm and normal heart sounds.   Pulmonary/Chest: Effort normal and breath sounds normal. No respiratory distress. She has no rales.  Abdominal: Soft. Bowel sounds are normal. She exhibits no distension and no mass. There is no tenderness.  Musculoskeletal:  Limping due to right leg pain  Neurological: She is alert.  Psychiatric:  Mood is poor today due to pain     Labs reviewed: Basic Metabolic Panel:  Recent Labs  16/12/9608/11/15 2139 01/16/14 1530 01/17/14 0708 01/18/14 0456  NA 144 144 145 140  K 4.4 4.8 4.4 4.9  CL 109 108 108 105  CO2 26 24  --  23  GLUCOSE 132* 139* 144* 98  BUN 29* 30* 32* 26*  CREATININE 1.15* 1.36* 1.40* 1.29*  CALCIUM 8.8 8.6*  --  8.7   Liver Function Tests:  Recent Labs  02/25/13 1206  12/29/13 2139 01/16/14 1530 01/18/14 0456  AST 20  < > 16 16 17   ALT 11  < > 9 10 7   ALKPHOS 46  < > 63 65 64  BILITOT 0.6  < > 0.3 0.3 0.5  PROT 6.2  < > 6.1 5.9* 5.6*  ALBUMIN 3.6  --  3.0*  --  2.7*  < > = values in this interval not displayed. No results for input(s): LIPASE, AMYLASE in the last 8760 hours. No results for input(s): AMMONIA in the last 8760 hours. CBC:  Recent Labs  09/15/13 0138 09/26/13 1410 12/29/13 2139 01/17/14 0708 01/18/14 0456  WBC 3.8* 5.7 4.4  --  4.7  NEUTROABS 2.1 3.5 2.2  --   --   HGB 10.6* 12.3 10.1* 10.2* 9.8*  HCT 32.5* 36.6 30.8* 30.0* 30.3*  MCV 94.2 91 92.8  --  94.4  PLT 139* 243 209  --  160   Lipid Panel:  Recent Labs  02/25/13 1206  CHOL 157  HDL 49.90  LDLCALC 79  TRIG 140.0  CHOLHDL 3   Lab Results  Component Value Date   HGBA1C 7.5* 01/17/2014    Assessment/Plan 1. DM (diabetes mellitus), type 2 with peripheral vascular complications - hba1c 7.5 last month - CBC With differential/Platelet - Comprehensive metabolic panel - Lipid panel - no changes to this today due to pt having to  rush to other appt  2. Hyperlipidemia - cont vytorin  - Lipid panel  3. Iatrogenic hypotension Will stop clonidine and norvasc Reduce losartan to 25mg  from 50mg  Cont coreg 3.125mg  bid Instruction provided with indications for meds listed - Comprehensive metabolic panel  4. Chronic kidney disease, stage III (moderate) - Comprehensive metabolic panel to reassess renal function with lasix 40mg  daily  5.  Right leg pain: Has right medial malleolus ulcer See hpi Has been going to wound center and doing exactly what she's supposed to do she tells me and wound only getting bigger Went to vascular surgeon as above--Dr. Darrick PennaFields' notes reviewed Has appt next there Given tylenol ES 2 tabs here to help pain until she sees him  Labs/tests ordered:   Orders Placed This Encounter  Procedures  . CBC With differential/Platelet  . Comprehensive metabolic panel    Order Specific Question:  Has the patient fasted?    Answer:  Yes  . Lipid panel    Order Specific Question:  Has the patient fasted?    Answer:  Yes   Next appt:  2 wks  Iyauna Sing L. Santanna Whitford, D.O. Geriatrics [facility] [address] [place], [place] [address] Cell Phone (Mon-Fri 8am-5pm):  [phone] On Call:  [phone] & follow prompts after 5pm & weekends Office Phone:  [phone] Office Fax:  [phone]

## 2014-02-10 NOTE — Patient Instructions (Signed)
Stop norvasc (amlodipine) Stop clonidine (catapres) Decrease cozaar (losartan) to 25mg  daily from 50mg  daily Cont coreg (carvedilol) 3.125mg  twice daily

## 2014-02-10 NOTE — Telephone Encounter (Signed)
Daughter called on pt's behalf.  Stated the pt. Had an Unna boot applied last week, and felt pain relief x 2 days following.  Reported the pt. Is now c.o burning and aching beneath the unna boot, and c/o "feeling like her skin is cracked and dry."  Reported the pt. is in so much pain that she can't walk.  Requesting to have her leg re-examined today.  Appt. given for 12:40 PM with the nurse practitioner.  Agrees with plan.

## 2014-02-11 ENCOUNTER — Telehealth: Payer: Self-pay | Admitting: *Deleted

## 2014-02-11 LAB — CBC WITH DIFFERENTIAL
Basophils Absolute: 0 10*3/uL (ref 0.0–0.2)
Basos: 0 %
Eos: 3 %
Eosinophils Absolute: 0.1 10*3/uL (ref 0.0–0.4)
HCT: 34.6 % (ref 34.0–46.6)
Hemoglobin: 10.7 g/dL — ABNORMAL LOW (ref 11.1–15.9)
Immature Grans (Abs): 0 10*3/uL (ref 0.0–0.1)
Immature Granulocytes: 0 %
Lymphocytes Absolute: 1.7 10*3/uL (ref 0.7–3.1)
Lymphs: 34 %
MCH: 29.6 pg (ref 26.6–33.0)
MCHC: 30.9 g/dL — ABNORMAL LOW (ref 31.5–35.7)
MCV: 96 fL (ref 79–97)
Monocytes Absolute: 0.5 10*3/uL (ref 0.1–0.9)
Monocytes: 9 %
Neutrophils Absolute: 2.8 10*3/uL (ref 1.4–7.0)
Neutrophils Relative %: 54 %
Platelets: 218 10*3/uL (ref 150–379)
RBC: 3.61 x10E6/uL — ABNORMAL LOW (ref 3.77–5.28)
RDW: 13.4 % (ref 12.3–15.4)
WBC: 5.1 10*3/uL (ref 3.4–10.8)

## 2014-02-11 LAB — COMPREHENSIVE METABOLIC PANEL
ALT: 10 IU/L (ref 0–32)
AST: 15 IU/L (ref 0–40)
Albumin/Globulin Ratio: 1.8 (ref 1.1–2.5)
Albumin: 4.2 g/dL (ref 3.5–4.7)
Alkaline Phosphatase: 75 IU/L (ref 39–117)
BUN/Creatinine Ratio: 27 — ABNORMAL HIGH (ref 11–26)
BUN: 46 mg/dL — ABNORMAL HIGH (ref 8–27)
CO2: 25 mmol/L (ref 18–29)
Calcium: 9.3 mg/dL (ref 8.7–10.3)
Chloride: 103 mmol/L (ref 97–108)
Creatinine, Ser: 1.68 mg/dL — ABNORMAL HIGH (ref 0.57–1.00)
GFR calc Af Amer: 32 mL/min/{1.73_m2} — ABNORMAL LOW (ref >59)
GFR calc non Af Amer: 28 mL/min/{1.73_m2} — ABNORMAL LOW (ref >59)
Globulin, Total: 2.3 g/dL (ref 1.5–4.5)
Glucose: 150 mg/dL — ABNORMAL HIGH (ref 65–99)
Potassium: 5.7 mmol/L — ABNORMAL HIGH (ref 3.5–5.2)
Sodium: 144 mmol/L (ref 134–144)
Total Bilirubin: 0.3 mg/dL (ref 0.0–1.2)
Total Protein: 6.5 g/dL (ref 6.0–8.5)

## 2014-02-11 LAB — LIPID PANEL
Chol/HDL Ratio: 3 ratio units (ref 0.0–4.4)
Cholesterol, Total: 127 mg/dL (ref 100–199)
HDL: 43 mg/dL (ref >39)
LDL Calculated: 54 mg/dL (ref 0–99)
Triglycerides: 151 mg/dL — ABNORMAL HIGH (ref 0–149)
VLDL Cholesterol Cal: 30 mg/dL (ref 5–40)

## 2014-02-11 NOTE — Telephone Encounter (Signed)
-----   Message from Kermit Baloiffany L Reed, DO sent at 02/11/2014 11:55 AM EST ----- Kidney function has deteriorated a bit probably due to daily lasix.  Let's  change lasix to 40mg  every other day.  Check weights daily and notify us if she gains more than 3 lbs in a day or 5 lbs in a week or becomes more short of breath.  Recheck BMP when she sees PanamaJessica.

## 2014-02-11 NOTE — Telephone Encounter (Signed)
Called patient to inform her of lab results she stated that she understood the changes and would call us if there are any changes in her weight.

## 2014-02-12 ENCOUNTER — Encounter (HOSPITAL_COMMUNITY): Payer: PRIVATE HEALTH INSURANCE

## 2014-02-18 ENCOUNTER — Other Ambulatory Visit: Payer: Self-pay | Admitting: *Deleted

## 2014-02-20 ENCOUNTER — Other Ambulatory Visit: Payer: Self-pay | Admitting: Internal Medicine

## 2014-02-20 ENCOUNTER — Encounter: Payer: PRIVATE HEALTH INSURANCE | Admitting: Vascular Surgery

## 2014-02-20 ENCOUNTER — Ambulatory Visit (INDEPENDENT_AMBULATORY_CARE_PROVIDER_SITE_OTHER): Payer: PRIVATE HEALTH INSURANCE

## 2014-02-20 DIAGNOSIS — M79604 Pain in right leg: Secondary | ICD-10-CM

## 2014-02-20 DIAGNOSIS — L97319 Non-pressure chronic ulcer of right ankle with unspecified severity: Secondary | ICD-10-CM

## 2014-02-24 ENCOUNTER — Ambulatory Visit: Payer: PRIVATE HEALTH INSURANCE | Admitting: Nurse Practitioner

## 2014-02-26 ENCOUNTER — Encounter: Payer: Self-pay | Admitting: Nurse Practitioner

## 2014-02-26 ENCOUNTER — Ambulatory Visit (INDEPENDENT_AMBULATORY_CARE_PROVIDER_SITE_OTHER): Payer: PRIVATE HEALTH INSURANCE | Admitting: Nurse Practitioner

## 2014-02-26 VITALS — BP 170/70 | HR 72 | Wt 151.0 lb

## 2014-02-26 DIAGNOSIS — L98499 Non-pressure chronic ulcer of skin of other sites with unspecified severity: Secondary | ICD-10-CM

## 2014-02-26 DIAGNOSIS — I70209 Unspecified atherosclerosis of native arteries of extremities, unspecified extremity: Secondary | ICD-10-CM

## 2014-02-26 DIAGNOSIS — I2510 Atherosclerotic heart disease of native coronary artery without angina pectoris: Secondary | ICD-10-CM

## 2014-02-26 DIAGNOSIS — I739 Peripheral vascular disease, unspecified: Secondary | ICD-10-CM

## 2014-02-26 DIAGNOSIS — I10 Essential (primary) hypertension: Secondary | ICD-10-CM

## 2014-02-26 LAB — BASIC METABOLIC PANEL
BUN: 21 mg/dL (ref 6–23)
CO2: 22 mEq/L (ref 19–32)
Calcium: 8.6 mg/dL (ref 8.4–10.5)
Chloride: 110 mEq/L (ref 96–112)
Creatinine, Ser: 1.1 mg/dL (ref 0.4–1.2)
GFR: 48.19 mL/min — ABNORMAL LOW (ref >60.00)
Glucose, Bld: 150 mg/dL — ABNORMAL HIGH (ref 70–99)
Potassium: 4 mEq/L (ref 3.5–5.1)
Sodium: 141 mEq/L (ref 135–145)

## 2014-02-26 NOTE — Patient Instructions (Addendum)
We will be checking the following labs today BMET  Stay on your current medicines - follow this list - I am adding back Norvasc 5 mg a day and Clonidine 0.1 mg at night  I will see you in 1 month  Call the [facility] office at [phone] if you have any questions, problems or concerns.

## 2014-02-26 NOTE — Progress Notes (Signed)
[name] Date of Birth: 1928/03/12 Medical Record #[id number]  History of Present Illness: [name] is seen back today for a follow up visit. It is a 7 month check. Seen for Dr. Excell Seltzerooper. She is a former patient of Dr. Ronnald Nianennant's. She has multiple issues which include CAD, remote PCI, BMS to the RCA and DES to the DX in January of 2012. She has longstanding diabetes, HTN, HLD, diastolic heart failure with EF 55 to 60% per cath back in 2012 but has been as low as 35%, PVD (seeing Dr. Darrick PennaFields), gout, CKD and chronic anemia.   Seen back in July of 2014 by me and seemed to be doing ok. Seen back in November of 2014 by PCP for some ongoing issues with anxiety/BP. Has had lisinopril added back along with some Xanax/Celexa. Having personal issues with a live in man that she does not want to evict as well as family issues in general. She has always cared for many animals in and at her home.   I saw her back in January of this year. Medicines had been adjusted for her BP. Still with lots of social issues with her home situation. I last saw her in May - she had seen Dr. Darrick PennaFields recently for a foot ulcer - managed conservatively. She ultimately underwent bilateral common iliac stenting on January 17, 2014. 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 She did have a postprocedure hematoma in the groin but this is now resolved. She has been seen at the wound center for intermittent debridement of her right medial malleolus ulcer. She has also been placed in an Radio broadcast assistantUnna boot.   Comes in today. Here alone with her daughter. She says her heart is doing ok. No chest pain. She has brought most of her medicines with her today. She is off several of her medicines. Not taking lasix, celexa. No Flonase. Off her Norvasc and Clonidine as well - says her BP got too low. Still with lots of pain in her  right foot - going to [facility] tomorrow. BP now tracking up. She is pretty frustrated and ready to give up.   Current Outpatient Prescriptions  Medication Sig Dispense Refill  . amLODipine (NORVASC) 5 MG tablet Take 5 mg by mouth daily.    Marland Kitchen. aspirin 81 MG tablet Take 81 mg by mouth daily. For heart health    . Bromfenac Sodium (PROLENSA) 0.07 % SOLN Place 1 drop into the left eye daily.     . carvedilol (COREG) 3.125 MG tablet TAKE 1 TABLET BY MOUTH TWICE DAILY FOR BLOOD PRESSURE 60 tablet 2  . cloNIDine (CATAPRES) 0.1 MG tablet Take 0.1 mg by mouth at bedtime.    Marland Kitchen. losartan (COZAAR) 50 MG tablet Take 25 mg by mouth daily.    Marland Kitchen. LOTEMAX 0.5 % ophthalmic suspension Place 1 drop into the left eye 3 (three) times daily.     Marland Kitchen. omega-3 acid ethyl esters (LOVAZA) 1 G capsule TAKE 2 CAPSULES BY MOUTH EVERY NIGHT AT BEDTIME FOR 30 DAYS 30 capsule 1  . SANTYL ointment Apply 1 application topically daily.     . timolol (TIMOPTIC) 0.5 % ophthalmic solution Place 1 drop into the left eye daily.     Marland Kitchen. trimethoprim-polymyxin b (POLYTRIM) ophthalmic solution Place 1 drop into both eyes every 4 (four) hours.     Marland Kitchen. VYTORIN 10-20 MG  per tablet TAKE 1/2 TABLET BY MOUTH EVERY NIGHT AT BEDTIME FOR 30 DAYS 30 tablet 1  . WELCHOL 625 MG tablet TAKE 1 TABLET BY MOUTH TWICE DAILY WITH MEALS 60 tablet 2  . glucose blood (ONE TOUCH ULTRA TEST) test strip 1 each by Other route See admin instructions. Check blood sugar daily as needed.    . mupirocin ointment (BACTROBAN) 2 % Apply 1 application topically daily.     . nitroGLYCERIN (NITROSTAT) 0.4 MG SL tablet Dissolve one tablet under tongue as needed for chest pain. May repeat as needed every 5 minutes up to 3 doses. Seek medical attention if pain is not relieved after 3 doses (Patient not taking: Reported on 02/26/2014) 25 tablet 11   No current facility-administered medications for this visit.    Allergies  Allergen Reactions  . Sulfa Antibiotics Other (See Comments)     Tongue swells  . Percocet [Oxycodone-Acetaminophen] Other (See Comments)  . Penicillins Hives    Past Medical History  Diagnosis Date  . Coronary artery disease     Remote PCI. Had BMS to RCA and DES stent to Diagonal in Jan. 2012   . Diabetes mellitus     long standing  . Diastolic heart failure     EF 55 to 60% per cath in Jan 2012  . Hyperlipidemia   . Hypertension   . Carotid bruit   . Gout   . Obesity   . TIA (transient ischemic attack)   . Chronic kidney disease     Stage 3  . Ischemic cardiomyopathy     with prior EF of 35%  . Anemia   . Proteinuria   . NSTEMI (non-ST elevated myocardial infarction) Jan 2012    with PCI to RCA and DX per Dr. Excell Seltzer  . Carotid artery occlusion   . Stroke 2013    ?  mini    . DVT (deep venous thrombosis)     Past Surgical History  Procedure Laterality Date  . Appendectomy    . Tonsillectomy    . Left ankle fusion    . Distal rca  04/05/2010    stent  . Fracture surgery Right 2005    bilateral ankles     History  Smoking status  . Former Smoker  . Quit date: 03/21/1968  Smokeless tobacco  . Never Used    History  Alcohol Use No    No family history on file.  Review of Systems: The review of systems is per the HPI.  All other systems were reviewed and are negative.  Physical Exam: There were no vitals taken for this visit.  Her BP originally today was 210/70. Repeat by me is down to 170/70. HR 72. Weight was 151 but she has a boot on the right foot.  Patient is very pleasant and in no acute distress. She looks chronically ill. Little more tearful today. Skin is warm and dry. Color is normal.  HEENT is unremarkable. Normocephalic/atraumatic. PERRL. Sclera are nonicteric. Neck is supple. No masses. No JVD. Lungs are clear. Cardiac exam shows a regular rate and rhythm. Abdomen is soft. Extremities are without edema. Unna boot on the right.  Gait and ROM are intact. No gross neurologic deficits noted.  Wt Readings from  Last 3 Encounters:  02/10/14 132 lb (59.875 kg)  02/10/14 132 lb (59.875 kg)  02/06/14 141 lb 6.4 oz (64.139 kg)    LABORATORY DATA/PROCEDURES: BMET is pending  Lab Results  Component Value Date  WBC 5.1 02/10/2014   HGB 10.7* 02/10/2014   HCT 34.6 02/10/2014   PLT 218 02/10/2014   GLUCOSE 150* 02/10/2014   CHOL 157 02/25/2013   TRIG 151* 02/10/2014   HDL 43 02/10/2014   LDLCALC 54 02/10/2014   ALT 10 02/10/2014   AST 15 02/10/2014   NA 144 02/10/2014   K 5.7* 02/10/2014   CL 103 02/10/2014   CREATININE 1.68* 02/10/2014   BUN 46* 02/10/2014   CO2 25 02/10/2014   TSH 2.297 04/04/2010   INR 1.09 04/15/2010   HGBA1C 7.5* 01/17/2014    BNP (last 3 results) No results for input(s): PROBNP in the last 8760 hours.   Assessment / Plan: 1. HTN - uncontrolled - restarting her Norvasc and Clonidine. See back in a month.  2. CAD - no symptoms reported - will continue with medical management   3. HLD   4. PVD - followed by [facility] with Dr. Darrick PennaFields - has visit tomorrow regarding her Radio broadcast assistantUnna boot.  5. DM  6. Hyperkalemia - on less ARB - recheck BMET today.   Overall prognosis tenuous at best. Her cardiac status seems stable but she continues to be slowing down in a generalized fashion to me. I will see her in 4 weeks.  Patient is agreeable to this plan and will call if any problems develop in the interim.   Rosalio MacadamiaLori C. Emmons Toth, RN, ANP-C [facility] [address] [place], [place]  [id number] [phone]

## 2014-02-27 ENCOUNTER — Ambulatory Visit (INDEPENDENT_AMBULATORY_CARE_PROVIDER_SITE_OTHER): Payer: PRIVATE HEALTH INSURANCE

## 2014-02-27 ENCOUNTER — Encounter (HOSPITAL_COMMUNITY): Payer: Self-pay | Admitting: Vascular Surgery

## 2014-02-27 DIAGNOSIS — L97319 Non-pressure chronic ulcer of right ankle with unspecified severity: Secondary | ICD-10-CM

## 2014-02-27 NOTE — Progress Notes (Unsigned)
Mrs. [name] was here today for an unna boot reapplication to her R LE. Her previous unna boot was removed and her R leg was washed with soap and water then dried thoroughly. A new unna boot was reapplied without difficulty. She will return next week for reapplication. Chanda Maness-Harrison CMA, AAMA

## 2014-02-27 NOTE — Progress Notes (Signed)
Mrs. [name] was seen today for a unna boot reapplication to her R LE. Her previous unna boot was removed and her R leg was washed with soap and water then dried thoroughly. A new unna boot was applied without difficulty. She will return next week to see Dr. Darrick PennaFields. Lucindia Lemley Maness-Harrison CMA, AAMA

## 2014-03-05 ENCOUNTER — Encounter: Payer: Self-pay | Admitting: Vascular Surgery

## 2014-03-06 ENCOUNTER — Ambulatory Visit (HOSPITAL_COMMUNITY)
Admission: RE | Admit: 2014-03-06 | Discharge: 2014-03-06 | Disposition: A | Discharge status: Home / Self Care | Payer: PRIVATE HEALTH INSURANCE | Source: Ambulatory Visit | Attending: Vascular Surgery | Admitting: Vascular Surgery

## 2014-03-06 ENCOUNTER — Ambulatory Visit (INDEPENDENT_AMBULATORY_CARE_PROVIDER_SITE_OTHER): Payer: PRIVATE HEALTH INSURANCE | Admitting: Vascular Surgery

## 2014-03-06 ENCOUNTER — Encounter: Payer: Self-pay | Admitting: Vascular Surgery

## 2014-03-06 VITALS — BP 155/58 | HR 64 | Resp 18 | Ht 60.0 in | Wt 151.0 lb

## 2014-03-06 DIAGNOSIS — I739 Peripheral vascular disease, unspecified: Secondary | ICD-10-CM

## 2014-03-06 DIAGNOSIS — Z48812 Encounter for surgical aftercare following surgery on the circulatory system: Secondary | ICD-10-CM | POA: Diagnosis not present

## 2014-03-06 MED ORDER — CODEINE SULFATE 15 MG PO TABS: 15.0000 mg | ORAL_TABLET | Freq: Four times a day (QID) | ORAL | Status: DC | PRN

## 2014-03-06 NOTE — Progress Notes (Signed)
HISTORY AND PHYSICAL     CC:  F/u of right leg wound Referring Provider:  Sharon Seller, NP  HPI: [name] who is s/p bilateral CIA stenting January 17, 2014.  She had bilateral SFA occlusive disease as well and an AV fistula in the right lower leg most likely originating from the tibial peroneal trunk.  She did have a post procedure hematoma in the right groin, but this was healed up at her last visit.    She does have medial malleolus ulcer that has been painful for her.  She was placed in an una boot and returned to the office 4 days later for new heal pain and they pt and daughter state the wound looked better even in that short amount of time.  She states that she has pain all the time.  Aspirin does not help her pain.  She has not tried any type of narcotics and cannot take Ibuprofen due to kidney disease.    She does take a beta blocker and ARB for her her HTN.  She is on a statin for her cholesterol.  Past Medical History  Diagnosis Date  . Coronary artery disease     Remote PCI. Had BMS to RCA and DES stent to Diagonal in Jan. 2012   . Diabetes mellitus     long standing  . Diastolic heart failure     EF 55 to 60% per cath in Jan 2012  . Hyperlipidemia   . Hypertension   . Carotid bruit   . Gout   . Obesity   . TIA (transient ischemic attack)   . Chronic kidney disease     Stage 3  . Ischemic cardiomyopathy     with prior EF of 35%  . Anemia   . Proteinuria   . NSTEMI (non-ST elevated myocardial infarction) Jan 2012    with PCI to RCA and DX per Dr. Excell Seltzer  . Carotid artery occlusion   . Stroke 2013    ?  mini    . DVT (deep venous thrombosis)     Past Surgical History  Procedure Laterality Date  . Appendectomy    . Tonsillectomy    . Left ankle fusion    . Distal rca  04/05/2010    stent  . Fracture surgery Right 2005    bilateral ankles   . Abdominal aortagram N/A 01/17/2014    Procedure: ABDOMINAL AORTAGRAM;  Surgeon: Sherren Kerns, MD;  Location: [facility] CATH LAB;  Service: Cardiovascular;  Laterality: N/A;    Allergies  Allergen Reactions  . Sulfa Antibiotics Other (See Comments)    Tongue swells  . Percocet [Oxycodone-Acetaminophen] Other (See Comments)  . Penicillins Hives    Current Outpatient Prescriptions  Medication Sig Dispense Refill  . amLODipine (NORVASC) 5 MG tablet Take 5 mg by mouth daily.    Marland Kitchen aspirin 81 MG tablet Take 81 mg by mouth daily. For heart health    . Bromfenac Sodium (PROLENSA) 0.07 % SOLN Place 1 drop into the left eye daily.     . carvedilol (COREG) 3.125 MG tablet TAKE 1 TABLET BY MOUTH TWICE DAILY FOR BLOOD PRESSURE 60 tablet 2  . cloNIDine (CATAPRES) 0.1 MG tablet Take 0.1 mg by mouth at bedtime.    Marland Kitchen glucose blood (ONE TOUCH ULTRA TEST) test strip 1 each by Other route See admin instructions. Check blood sugar daily as needed.    Marland Kitchen losartan (COZAAR) 50 MG tablet  Take 25 mg by mouth daily.    Marland Kitchen. LOTEMAX 0.5 % ophthalmic suspension Place 1 drop into the left eye 3 (three) times daily.     . mupirocin ointment (BACTROBAN) 2 % Apply 1 application topically daily.     . nitroGLYCERIN (NITROSTAT) 0.4 MG SL tablet Dissolve one tablet under tongue as needed for chest pain. May repeat as needed every 5 minutes up to 3 doses. Seek medical attention if pain is not relieved after 3 doses 25 tablet 11  . omega-3 acid ethyl esters (LOVAZA) 1 G capsule TAKE 2 CAPSULES BY MOUTH EVERY NIGHT AT BEDTIME FOR 30 DAYS 30 capsule 1  . SANTYL ointment Apply 1 application topically daily.     . timolol (TIMOPTIC) 0.5 % ophthalmic solution Place 1 drop into the left eye daily.     Marland Kitchen. trimethoprim-polymyxin b (POLYTRIM) ophthalmic solution Place 1 drop into both eyes every 4 (four) hours.     Marland Kitchen. VYTORIN 10-20 MG per tablet TAKE 1/2 TABLET BY MOUTH EVERY NIGHT AT BEDTIME FOR 30 DAYS 30 tablet 1  . WELCHOL 625 MG tablet TAKE 1 TABLET BY MOUTH TWICE DAILY WITH MEALS 60 tablet 2   No current  facility-administered medications for this visit.    No family history on file.  History   Social History  . Marital Status: Widowed    Spouse Name: N/A    Number of Children: N/A  . Years of Education: N/A   Occupational History  . Not on file.   Social History Main Topics  . Smoking status: Former Smoker    Quit date: 03/21/1968  . Smokeless tobacco: Never Used  . Alcohol Use: No  . Drug Use: No  . Sexual Activity: No   Other Topics Concern  . Not on file   Social History Narrative     ROS: [x]  Positive   [ ]  Negative   [ ]  All sytems reviewed and are negative  Cardiovascular: []  chest pain/pressure []  palpitations []  SOB lying flat []  DOE []  pain in legs while walking [x]  pain in right foot with ulcer []  hx of DVT []  hx of phlebitis []  swelling in legs []  varicose veins  Pulmonary: []  productive cough []  asthma []  wheezing  Neurologic: []  weakness in []  arms []  legs []  numbness in []  arms []  legs [] difficulty speaking or slurred speech []  temporary loss of vision in one eye []  dizziness  Hematologic: []  bleeding problems []  problems with blood clotting easily  GI []  vomiting blood []  blood in stool  GU: []  burning with urination []  blood in urine  Psychiatric: []  hx of major depression  Integumentary: []  rashes [x]  ulcers  Constitutional: []  fever []  chills   PHYSICAL EXAMINATION:  Filed Vitals:   03/06/14 1537  BP: 155/58  Pulse: 64  Resp: 18   Body mass index is 29.49 kg/(m^2).  General:  WDWN in NAD Gait: Not observed HENT: WNL, normocephalic Pulmonary: normal non-labored breathing  Abdomen: soft, NT, no masses Skin: without rashes, with ulcer of right medial malleolus.  Vascular Exam/Pulses:  Right Left  DP absent absent  PT absent absent   Extremities: without ischemic changes, without Gangrene , without cellulitis; with open wounds to right medial malleolus that measures 1.5cm x 1.3cm; BLE  edema Musculoskeletal: no muscle wasting or atrophy  Neurologic: A&O X 3; Appropriate Affect ; SENSATION: normal; MOTOR FUNCTION:  moving all extremities equally. Speech is fluent/normal   Non-Invasive Vascular Imaging:   ABI's 03/06/14:  Right:  0.48 Left:  0.59  Previous ABI's 01/08/14: Right:  0.37 Left:  0.58  Pt meds includes: Statin:  Yes.   Beta Blocker:  Yes.   Aspirin:  Yes.   ACEI:  No. ARB:  No. Other Antiplatelet/Anticoagulant:  No.    ASSESSMENT/PLAN:: 80 y.o. female with mixed arterial and venous disease of RLE   -her wound today measures 1.5cm x 1.3cm, which is smaller than her last visit at which time it measured 3cm x 3cm  -continue unna boot for 4 weeks with weekly changes and hopefully, her wound will continue to heal -she does not take anything for her pain.  Dr. Darrick PennaFields has prescribed her codeine 15mg  and she will take half a tablet to start.  She is given #60 tablets -she will f/u with Dr. Darrick PennaFields in 4 weeks -her ABI's today were slightly improved on the right.   Ashley MassedSamantha Terreon Ekholm, PA-C [facility] [phone]  Clinic MD:  Pt seen and examined in conjunction with Dr. Darrick PennaFields  History and exam details as above. Her ulcer has decreased by 50% in size with Unna boot therapy. We will continue our current course. She will follow-up with me in 1 month. She will have weekly Unna boot changes for the next 4 weeks.  Fabienne Brunsharles Fields, MD [facility] [place] Office: [phone] Pager: [phone]

## 2014-03-13 ENCOUNTER — Ambulatory Visit (INDEPENDENT_AMBULATORY_CARE_PROVIDER_SITE_OTHER): Payer: PRIVATE HEALTH INSURANCE | Admitting: *Deleted

## 2014-03-13 DIAGNOSIS — L97319 Non-pressure chronic ulcer of right ankle with unspecified severity: Secondary | ICD-10-CM

## 2014-03-13 NOTE — Progress Notes (Signed)
Patient came in today for unna boot change on the RLE. The boot is covered in cat hair and smells of cat urine. Mrs [name] states that she has 12 cats and does not want to remove her dressing at home in order to shower. Her unna boot is removed without difficulty and a new boot was placed. Patient was instructed on care of unna boot and she will return in 1 week.

## 2014-03-17 ENCOUNTER — Other Ambulatory Visit: Payer: Self-pay | Admitting: Internal Medicine

## 2014-03-19 ENCOUNTER — Other Ambulatory Visit: Payer: Self-pay | Admitting: Nurse Practitioner

## 2014-03-20 ENCOUNTER — Ambulatory Visit (INDEPENDENT_AMBULATORY_CARE_PROVIDER_SITE_OTHER): Payer: PRIVATE HEALTH INSURANCE

## 2014-03-20 ENCOUNTER — Telehealth: Payer: Self-pay | Admitting: *Deleted

## 2014-03-20 ENCOUNTER — Ambulatory Visit: Payer: PRIVATE HEALTH INSURANCE | Admitting: Vascular Surgery

## 2014-03-20 DIAGNOSIS — L97319 Non-pressure chronic ulcer of right ankle with unspecified severity: Secondary | ICD-10-CM

## 2014-03-20 NOTE — Telephone Encounter (Signed)
Called left message with son at the home to have patient call the office to reschedule appointment that was missed with Abbey ChattersJessica Eubanks. He stated that he have her call the office on Monday.

## 2014-03-21 DIAGNOSIS — J189 Pneumonia, unspecified organism: Secondary | ICD-10-CM

## 2014-03-21 HISTORY — DX: Pneumonia, unspecified organism: J18.9

## 2014-03-24 NOTE — Progress Notes (Signed)
[name] was seen today(03/20/2014) for an unna boot change to Rt LE. Previous unna boot had not been removed, pt stated she is afraid her cats would get to her wound. I removed her previous unna boot and cleaned Rt leg with kerra cleanse wound cleaner. A new unna boot was applied without difficulty. She will return next week 03/27/14 for reapplication. Jayveon Convey Maness-Harrison CMA, AAMA

## 2014-03-27 ENCOUNTER — Ambulatory Visit (INDEPENDENT_AMBULATORY_CARE_PROVIDER_SITE_OTHER): Payer: 59

## 2014-03-27 DIAGNOSIS — L97319 Non-pressure chronic ulcer of right ankle with unspecified severity: Secondary | ICD-10-CM

## 2014-03-27 NOTE — Progress Notes (Signed)
Mrs. [name] was seen today for a unna boot reapplication to her Rt LE. The old unna boot was removed. Her Rt leg was cleaned with Cyril LoosenKerra Klenz and dried thoroughly. A new unna boot was applied without difficulty. She will return next week to see Dr. Darrick PennaFields. Ronelle Smallman Maness-Harrison CMA, AAMA

## 2014-03-28 ENCOUNTER — Encounter (INDEPENDENT_AMBULATORY_CARE_PROVIDER_SITE_OTHER): Payer: Medicare Other | Admitting: Ophthalmology

## 2014-03-28 DIAGNOSIS — H35033 Hypertensive retinopathy, bilateral: Secondary | ICD-10-CM

## 2014-03-28 DIAGNOSIS — E11311 Type 2 diabetes mellitus with unspecified diabetic retinopathy with macular edema: Secondary | ICD-10-CM | POA: Diagnosis not present

## 2014-03-28 DIAGNOSIS — E11331 Type 2 diabetes mellitus with moderate nonproliferative diabetic retinopathy with macular edema: Secondary | ICD-10-CM

## 2014-03-28 DIAGNOSIS — E11339 Type 2 diabetes mellitus with moderate nonproliferative diabetic retinopathy without macular edema: Secondary | ICD-10-CM

## 2014-03-28 DIAGNOSIS — H43813 Vitreous degeneration, bilateral: Secondary | ICD-10-CM

## 2014-03-28 DIAGNOSIS — I10 Essential (primary) hypertension: Secondary | ICD-10-CM

## 2014-04-02 ENCOUNTER — Encounter: Payer: Self-pay | Admitting: Nurse Practitioner

## 2014-04-02 ENCOUNTER — Encounter: Payer: Self-pay | Admitting: Vascular Surgery

## 2014-04-02 ENCOUNTER — Ambulatory Visit (INDEPENDENT_AMBULATORY_CARE_PROVIDER_SITE_OTHER): Payer: 59 | Admitting: Nurse Practitioner

## 2014-04-02 VITALS — BP 140/48 | HR 54 | Ht 60.0 in | Wt 148.8 lb

## 2014-04-02 DIAGNOSIS — I259 Chronic ischemic heart disease, unspecified: Secondary | ICD-10-CM

## 2014-04-02 DIAGNOSIS — R635 Abnormal weight gain: Secondary | ICD-10-CM

## 2014-04-02 DIAGNOSIS — R5382 Chronic fatigue, unspecified: Secondary | ICD-10-CM

## 2014-04-02 DIAGNOSIS — I10 Essential (primary) hypertension: Secondary | ICD-10-CM

## 2014-04-02 DIAGNOSIS — I2510 Atherosclerotic heart disease of native coronary artery without angina pectoris: Secondary | ICD-10-CM

## 2014-04-02 LAB — BASIC METABOLIC PANEL WITH GFR
BUN: 31 mg/dL — ABNORMAL HIGH (ref 6–23)
CO2: 26 meq/L (ref 19–32)
Calcium: 8.8 mg/dL (ref 8.4–10.5)
Chloride: 107 meq/L (ref 96–112)
Creatinine, Ser: 1.25 mg/dL — ABNORMAL HIGH (ref 0.40–1.20)
GFR: 43.32 mL/min — ABNORMAL LOW
Glucose, Bld: 146 mg/dL — ABNORMAL HIGH (ref 70–99)
Potassium: 4.8 meq/L (ref 3.5–5.1)
Sodium: 140 meq/L (ref 135–145)

## 2014-04-02 LAB — CBC
HCT: 32.2 % — ABNORMAL LOW (ref 36.0–46.0)
Hemoglobin: 10.4 g/dL — ABNORMAL LOW (ref 12.0–15.0)
MCHC: 32.3 g/dL (ref 30.0–36.0)
MCV: 90 fl (ref 78.0–100.0)
Platelets: 163 K/uL (ref 150.0–400.0)
RBC: 3.58 Mil/uL — ABNORMAL LOW (ref 3.87–5.11)
RDW: 14.1 % (ref 11.5–15.5)
WBC: 5.8 K/uL (ref 4.0–10.5)

## 2014-04-02 LAB — TSH: TSH: 5.43 u[IU]/mL — ABNORMAL HIGH (ref 0.35–4.50)

## 2014-04-02 MED ORDER — CARVEDILOL 3.125 MG PO TABS: 3.1250 mg | ORAL_TABLET | Freq: Every day | ORAL | Status: DC

## 2014-04-02 MED ORDER — CLONIDINE HCL 0.1 MG PO TABS: 0.1000 mg | ORAL_TABLET | Freq: Every day | ORAL | Status: DC

## 2014-04-02 NOTE — Progress Notes (Signed)
[name] Date of Birth: Jan 13, 1928 Medical Record #[id number]  History of Present Illness: [name] is seen back today for a follow up visit. It is a 1 month check. Seen for Dr. Excell Seltzer. She is a former patient of Dr. Ronnald Nian. She has multiple issues which include CAD, remote PCI, BMS to the RCA and DES to the DX in January of 2012. She has longstanding diabetes, HTN, HLD, diastolic heart failure with EF 55 to 60% per cath back in 2012 but has been as low as 35%, PVD (seeing Dr. Darrick Penna), gout, CKD and chronic anemia.   Seen back in July of 2014 by me and seemed to be doing ok. Seen back in November of 2014 by PCP for some ongoing issues with anxiety/BP. Has had lisinopril added back along with some Xanax/Celexa. Having personal issues with a live in man that she does not want to evict as well as family issues in general. She has always cared for many animals in and at her home.   I saw her back in January of 2015. Medicines had been adjusted for her BP. Still with lots of social issues with her home situation. I saw her in May of 2015- she had seen Dr. Darrick Penna recently for a foot ulcer - managed conservatively. She ultimately underwent bilateral common iliac stenting on January 17, 2014. 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 She did have a postprocedure hematoma in the groin but this is now resolved. She has been seen at the wound center for intermittent debridement of her right medial malleolus ulcer. She was placed in an Radio broadcast assistant.   Seen a month ago - off lots of her medicines. BP was up. Clonidine and Norvasc were restarted. She was on a lower dose of ARB.  Comes in today. Here alone today. She says she is doing fine. No chest pain. Going back to [facility] for her unna boot change tomorrow. She is worried about weight gain. She is up about 10 pounds but this is  over the past 1 to 1/2 year. Less pain in her foot. Not short of breath. No swelling. Notes that her balance is off but no falls. She has stopped lots of her medicines again - BP readings from her are looked at - not too bad - one reading in the 90's systolic. Mainly in the 130 to 140's. She has some issues with phlegm. Red eyes. Has had a falling out with [facility].   Current Outpatient Prescriptions  Medication Sig Dispense Refill  . aspirin 81 MG tablet Take 81 mg by mouth daily. For heart health    . Bromfenac Sodium (PROLENSA) 0.07 % SOLN Place 1 drop into the left eye daily.     . carvedilol (COREG) 3.125 MG tablet Take 1 tablet (3.125 mg total) by mouth daily. 60 tablet 2  . cloNIDine (CATAPRES) 0.1 MG tablet Take 1 tablet (0.1 mg total) by mouth at bedtime. 30 tablet   . codeine 15 MG tablet Take 1 tablet (15 mg total) by mouth every 6 (six) hours as needed (may take one half tablet instead of whole tablet). 60 tablet 0  . glucose blood (ONE TOUCH ULTRA TEST) test strip 1 each by Other route See admin instructions. Check blood sugar daily as needed.    Marland Kitchen LOTEMAX 0.5 % ophthalmic suspension Place 1 drop into the  left eye 3 (three) times daily.     . nitroGLYCERIN (NITROSTAT) 0.4 MG SL tablet Dissolve one tablet under tongue as needed for chest pain. May repeat as needed every 5 minutes up to 3 doses. Seek medical attention if pain is not relieved after 3 doses 25 tablet 11  . omega-3 acid ethyl esters (LOVAZA) 1 G capsule TAKE 2 CAPSULES BY MOUTH EVERY NIGHT AT BEDTIME FOR 30 DAYS 60 capsule 1  . SANTYL ointment Apply 1 application topically daily.     . timolol (TIMOPTIC) 0.5 % ophthalmic solution Place 1 drop into the left eye daily.     Marland Kitchen. trimethoprim-polymyxin b (POLYTRIM) ophthalmic solution Place 1 drop into both eyes every 4 (four) hours.     Marland Kitchen. VYTORIN 10-20 MG per tablet TAKE 1/2 TABLET BY MOUTH EVERY NIGHT AT BEDTIME FOR 30 DAYS 30 tablet 1  . WELCHOL 625 MG tablet TAKE 1  TABLET BY MOUTH TWICE DAILY WITH MEALS 30 tablet 5   No current facility-administered medications for this visit.    Allergies  Allergen Reactions  . Sulfa Antibiotics Other (See Comments)    Tongue swells  . Percocet [Oxycodone-Acetaminophen] Other (See Comments)  . Penicillins Hives    Past Medical History  Diagnosis Date  . Coronary artery disease     Remote PCI. Had BMS to RCA and DES stent to Diagonal in Jan. 2012   . Diabetes mellitus     long standing  . Diastolic heart failure     EF 55 to 60% per cath in Jan 2012  . Hyperlipidemia   . Hypertension   . Carotid bruit   . Gout   . Obesity   . TIA (transient ischemic attack)   . Chronic kidney disease     Stage 3  . Ischemic cardiomyopathy     with prior EF of 35%  . Anemia   . Proteinuria   . NSTEMI (non-ST elevated myocardial infarction) Jan 2012    with PCI to RCA and DX per Dr. Excell Seltzerooper  . Carotid artery occlusion   . Stroke 2013    ?  mini    . DVT (deep venous thrombosis)     Past Surgical History  Procedure Laterality Date  . Appendectomy    . Tonsillectomy    . Left ankle fusion    . Distal rca  04/05/2010    stent  . Fracture surgery Right 2005    bilateral ankles   . Abdominal aortagram N/A 01/17/2014    Procedure: ABDOMINAL AORTAGRAM;  Surgeon: Sherren Kernsharles E Fields, MD;  [facility];  Service: Cardiovascular;  Laterality: N/A;    History  Smoking status  . Former Smoker  . Quit date: 03/21/1968  Smokeless tobacco  . Never Used    History  Alcohol Use No    History reviewed. No pertinent family history.  Review of Systems: The review of systems is per the HPI.  All other systems were reviewed and are negative.  Physical Exam: BP 140/48 mmHg  Pulse 54  Ht 5' (1.524 m)  Wt 148 lb 12.8 oz (67.495 kg)  BMI 29.06 kg/m2  SpO2 94% Patient is very pleasant and in no acute distress. Weight is down 3 pounds. Skin is warm and dry. Color is normal.  HEENT is unremarkable.  Normocephalic/atraumatic. PERRL. Sclera are nonicteric. Neck is supple. No masses. No JVD. Lungs are clear. Cardiac exam shows a regular rate and rhythm. Abdomen is soft. Extremities are without edema.  Gait and ROM are intact. No gross neurologic deficits noted.  Wt Readings from Last 3 Encounters:  04/02/14 148 lb 12.8 oz (67.495 kg)  03/06/14 151 lb (68.493 kg)  02/26/14 151 lb (68.493 kg)    LABORATORY DATA/PROCEDURES: PENDING  Lab Results  Component Value Date   WBC 5.1 02/10/2014   HGB 10.7* 02/10/2014   HCT 34.6 02/10/2014   PLT 218 02/10/2014   GLUCOSE 150* 02/26/2014   CHOL 157 02/25/2013   TRIG 151* 02/10/2014   HDL 43 02/10/2014   LDLCALC 54 02/10/2014   ALT 10 02/10/2014   AST 15 02/10/2014   NA 141 02/26/2014   K 4.0 02/26/2014   CL 110 02/26/2014   CREATININE 1.1 02/26/2014   BUN 21 02/26/2014   CO2 22 02/26/2014   TSH 2.297 04/04/2010   INR 1.09 04/15/2010   HGBA1C 7.5* 01/17/2014    BNP (last 3 results) No results for input(s): PROBNP in the last 8760 hours.   Assessment / Plan: 1. HTN - ok with her current regimen although not idea - will see her back in 3 months to recheck her.   2. CAD - no symptoms reported - will continue with medical management   3. HLD   4. PVD - followed by [facility] with Dr. Darrick Penna - has visit tomorrow regarding her Radio broadcast assistant.  5. DM  6. Hyperkalemia - off ARB  7. Weight gain and fatigue - recheck lab.  Overall prognosis tenuous at best. Her cardiac status seems stable but she continues to be slowing down in a generalized fashion to me. I will see her in 4 weeks. Will try to get her to PCP - she has had a falling out with [facility] apparently.   Patient is agreeable to this plan and will call if any problems develop in the interim.   Rosalio Macadamia, RN, ANP-C [facility] [address] [place], [place]  [address] [phone]

## 2014-04-02 NOTE — Patient Instructions (Addendum)
We will be checking the following labs today BMET, CBC, TSH  Follow this current list of medicines  See me in 3 months  Ok to try Claritin (loratidine) daily   We will try to arrange new primary care for you  Call the [facility] office at [phone] if you have any questions, problems or concerns.

## 2014-04-03 ENCOUNTER — Ambulatory Visit (INDEPENDENT_AMBULATORY_CARE_PROVIDER_SITE_OTHER): Payer: 59 | Admitting: Vascular Surgery

## 2014-04-03 ENCOUNTER — Encounter: Payer: Self-pay | Admitting: Vascular Surgery

## 2014-04-03 ENCOUNTER — Other Ambulatory Visit: Payer: 59

## 2014-04-03 ENCOUNTER — Other Ambulatory Visit: Payer: Self-pay | Admitting: *Deleted

## 2014-04-03 VITALS — BP 169/54 | HR 52 | Ht 60.0 in | Wt 147.5 lb

## 2014-04-03 DIAGNOSIS — IMO0001 Reserved for inherently not codable concepts without codable children: Secondary | ICD-10-CM

## 2014-04-03 DIAGNOSIS — I83019 Varicose veins of right lower extremity with ulcer of unspecified site: Secondary | ICD-10-CM

## 2014-04-03 DIAGNOSIS — I83018 Varicose veins of right lower extremity with ulcer other part of lower leg: Secondary | ICD-10-CM

## 2014-04-03 DIAGNOSIS — I83015 Varicose veins of right lower extremity with ulcer other part of foot: Secondary | ICD-10-CM

## 2014-04-03 DIAGNOSIS — I83012 Varicose veins of right lower extremity with ulcer of calf: Secondary | ICD-10-CM

## 2014-04-03 DIAGNOSIS — R7989 Other specified abnormal findings of blood chemistry: Secondary | ICD-10-CM

## 2014-04-03 DIAGNOSIS — I83014 Varicose veins of right lower extremity with ulcer of heel and midfoot: Secondary | ICD-10-CM

## 2014-04-03 DIAGNOSIS — I83013 Varicose veins of right lower extremity with ulcer of ankle: Secondary | ICD-10-CM

## 2014-04-03 DIAGNOSIS — I83011 Varicose veins of right lower extremity with ulcer of thigh: Secondary | ICD-10-CM

## 2014-04-03 NOTE — Progress Notes (Signed)
  HISTORY AND PHYSICAL     CC:  F/u of right leg wound Referring Provider:  Sharon SellerEubanks, Jessica K, NP  HPI: Patient is a 81 year old female that we have been following for chronic ulceration right medial malleolus. This is thought to be primarily venous in nature. She has been receiving weekly Unna boot therapy. She is s/p bilateral CIA stenting January 17, 2014.  She had bilateral SFA occlusive disease as well and an AV fistula in the right lower leg most likely originating from the tibial peroneal trunk.   She states that her pain has improved with twice daily codeine 15 mg.  ROS: [x]  Positive   [ ]  Negative   [ ]  All sytems reviewed and are negative  Cardiovascular: []  chest pain/pressure []  palpitations []  SOB lying flat []  DOE []  pain in legs while walking [x]  pain in right foot with ulcer []  hx of DVT []  hx of phlebitis []  swelling in legs []  varicose veins    PHYSICAL EXAMINATION:   Filed Vitals:   04/03/14 1125  BP: 169/54  Pulse: 52  Height: 5' (1.524 m)  Weight: 147 lb 8 oz (66.906 kg)  SpO2: 99%   General:  WDWN in NAD Pulmonary: normal non-labored breathing  Skin: without rashes, with ulcer of right medial malleolus.   Extremities:  No palpable pedal pulses, right medial malleolus ulcer 1 x 1 cm decreased from 1.5cm x 1.3cm   ASSESSMENT/PLAN:: 81 y.o. female with mixed arterial and venous disease of RLE  Her ulcer has decreased by 20% in size with Unna boot therapy. We will continue our current course. She will follow-up with me in 1 month. She will have weekly Unna boot changes for the next 4 weeks.  Fabienne Brunsharles Fields, MD [facility] [place] Office: [phone] Pager: [phone]

## 2014-04-09 ENCOUNTER — Encounter: Payer: Self-pay | Admitting: Vascular Surgery

## 2014-04-10 ENCOUNTER — Ambulatory Visit: Payer: PRIVATE HEALTH INSURANCE | Admitting: Vascular Surgery

## 2014-04-10 ENCOUNTER — Ambulatory Visit (INDEPENDENT_AMBULATORY_CARE_PROVIDER_SITE_OTHER): Payer: 59

## 2014-04-10 DIAGNOSIS — I83011 Varicose veins of right lower extremity with ulcer of thigh: Secondary | ICD-10-CM | POA: Diagnosis not present

## 2014-04-10 DIAGNOSIS — L97909 Non-pressure chronic ulcer of unspecified part of unspecified lower leg with unspecified severity: Secondary | ICD-10-CM

## 2014-04-10 DIAGNOSIS — I83009 Varicose veins of unspecified lower extremity with ulcer of unspecified site: Secondary | ICD-10-CM | POA: Insufficient documentation

## 2014-04-10 DIAGNOSIS — IMO0001 Reserved for inherently not codable concepts without codable children: Secondary | ICD-10-CM

## 2014-04-10 NOTE — Progress Notes (Signed)
Mrs. [name] was seen today for an unna boot change to her right LE. The previous unna boot was removed. Her Rt leg was washed with antibacterial soap, then rinsed and dried it. The new unna boot was reapplied. She will return next week for reapplication. Chanda Maness-Harrison CMA, AAMA

## 2014-04-16 ENCOUNTER — Encounter: Payer: Self-pay | Admitting: Vascular Surgery

## 2014-04-17 ENCOUNTER — Ambulatory Visit (INDEPENDENT_AMBULATORY_CARE_PROVIDER_SITE_OTHER): Payer: 59

## 2014-04-17 DIAGNOSIS — L97319 Non-pressure chronic ulcer of right ankle with unspecified severity: Secondary | ICD-10-CM | POA: Diagnosis not present

## 2014-04-17 NOTE — Progress Notes (Signed)
[name] was seen today for an D.[employer]. H[employer], IncUnna Boot change on her Right Medial Malleolus.   Wound was cleansed with warm water and some signs of healing were evident. Pt. Had no complaints. New Unna Boot was applied with no difficulty  and [name] will return in one week for reapplication. Pt.states she cannot remove the unna boot on her own.  We will be glad to remove unna boot and clean leg for her.   Yoland Scherr Eldridge-Lewis , RMA-AMT

## 2014-04-23 ENCOUNTER — Encounter: Payer: Self-pay | Admitting: Vascular Surgery

## 2014-04-24 ENCOUNTER — Ambulatory Visit (INDEPENDENT_AMBULATORY_CARE_PROVIDER_SITE_OTHER): Payer: 59 | Admitting: Vascular Surgery

## 2014-04-24 ENCOUNTER — Encounter: Payer: Self-pay | Admitting: Vascular Surgery

## 2014-04-24 VITALS — BP 157/37 | HR 60 | Temp 98.3°F | Resp 20 | Ht 60.0 in | Wt 135.0 lb

## 2014-04-24 DIAGNOSIS — L97319 Non-pressure chronic ulcer of right ankle with unspecified severity: Secondary | ICD-10-CM

## 2014-04-24 NOTE — Progress Notes (Signed)
Patient is an 81 year old female who has been undergoing Unna boot therapy for a stasis ulcer on her right medial malleolus. She presents today after 4 weeks of inability therapy. She states she has no pain in the ulcer. She occasionally takes a pain pill but rarely.    Physical exam:  Filed Vitals:   04/24/14 1152  BP: 157/37  Pulse: 60  Temp: 98.3 F (36.8 C)  Resp: 20  Height: 5' (1.524 m)  Weight: 135 lb (61.236 kg)    Right lower extremity: 9 x 6 mm less than 1 mm depth ulceration almost completely epithelialized. This is compared to 10 x 10 mm one month ago and 15 x 13 mm one month prior to that  Assessment: Healing stasis ulcer right lower extremity  Plan: Weekly Unna boot therapy 4 and I will see her back in 1 month  Fabienne Brunsharles Fields, MD [facility] [place] Office: [phone] Pager: [phone]

## 2014-04-30 ENCOUNTER — Encounter: Payer: Self-pay | Admitting: Vascular Surgery

## 2014-05-01 ENCOUNTER — Encounter (INDEPENDENT_AMBULATORY_CARE_PROVIDER_SITE_OTHER): Payer: 59

## 2014-05-01 ENCOUNTER — Other Ambulatory Visit: Payer: 59

## 2014-05-01 ENCOUNTER — Other Ambulatory Visit (INDEPENDENT_AMBULATORY_CARE_PROVIDER_SITE_OTHER): Payer: 59 | Admitting: *Deleted

## 2014-05-01 DIAGNOSIS — L97319 Non-pressure chronic ulcer of right ankle with unspecified severity: Secondary | ICD-10-CM | POA: Diagnosis not present

## 2014-05-01 DIAGNOSIS — I10 Essential (primary) hypertension: Secondary | ICD-10-CM

## 2014-05-01 DIAGNOSIS — E785 Hyperlipidemia, unspecified: Secondary | ICD-10-CM

## 2014-05-01 DIAGNOSIS — R7989 Other specified abnormal findings of blood chemistry: Secondary | ICD-10-CM

## 2014-05-01 LAB — TSH: TSH: 4.25 u[IU]/mL (ref 0.35–4.50)

## 2014-05-01 NOTE — Addendum Note (Signed)
Addended by: Tonita PhoenixBOWDEN, ROBIN K on: 05/01/2014 10:41 AM   Modules accepted: Orders

## 2014-05-06 ENCOUNTER — Telehealth: Payer: Self-pay | Admitting: Nurse Practitioner

## 2014-05-06 NOTE — Telephone Encounter (Signed)
New Msg        Arna Mediciora calling for pt, states returning call from yesterday about labs.    Please return call to Arna Mediciora at [phone].

## 2014-05-06 NOTE — Telephone Encounter (Signed)
TSH level/ no med change was reviewed. No further questions.

## 2014-05-07 ENCOUNTER — Encounter: Payer: Self-pay | Admitting: Vascular Surgery

## 2014-05-08 ENCOUNTER — Ambulatory Visit (INDEPENDENT_AMBULATORY_CARE_PROVIDER_SITE_OTHER): Payer: Medicaid Other

## 2014-05-08 ENCOUNTER — Ambulatory Visit: Payer: 59 | Admitting: Vascular Surgery

## 2014-05-08 DIAGNOSIS — L97319 Non-pressure chronic ulcer of right ankle with unspecified severity: Secondary | ICD-10-CM | POA: Diagnosis not present

## 2014-05-09 NOTE — Progress Notes (Signed)
Mrs. [name] was seen on 05/08/2014 for an unna boot reapplication of right LE. The previous unna boot was removed. Her Rt leg was washed with soap and water then dried thoroughly. A new unna boot was applied. She will return next week for reapplication. Brittainy Bucker Maness-Harrison CMA, AAMA

## 2014-05-14 ENCOUNTER — Encounter: Payer: Self-pay | Admitting: Vascular Surgery

## 2014-05-15 ENCOUNTER — Ambulatory Visit (INDEPENDENT_AMBULATORY_CARE_PROVIDER_SITE_OTHER): Payer: Medicaid Other

## 2014-05-15 DIAGNOSIS — L97319 Non-pressure chronic ulcer of right ankle with unspecified severity: Secondary | ICD-10-CM

## 2014-05-15 NOTE — Progress Notes (Signed)
[name] was seen today for an unna boot reapplication to her Rt LE. The previous unna boot was removed. I washed her leg with soap and warm water then dried thoroughly. A new unna boot was applied. She will return next week to see Dr. Darrick PennaFields and re evaluation. Madeleyn Schwimmer Maness-Harrison CMA, AAMA

## 2014-05-21 ENCOUNTER — Encounter: Payer: Self-pay | Admitting: Vascular Surgery

## 2014-05-22 ENCOUNTER — Ambulatory Visit (INDEPENDENT_AMBULATORY_CARE_PROVIDER_SITE_OTHER): Payer: Medicare Other | Admitting: Vascular Surgery

## 2014-05-22 ENCOUNTER — Encounter: Payer: Self-pay | Admitting: Vascular Surgery

## 2014-05-22 VITALS — BP 168/78 | HR 63 | Resp 18 | Ht 60.0 in | Wt 135.0 lb

## 2014-05-22 DIAGNOSIS — I83212 Varicose veins of right lower extremity with both ulcer of calf and inflammation: Secondary | ICD-10-CM

## 2014-05-22 DIAGNOSIS — I83214 Varicose veins of right lower extremity with both ulcer of heel and midfoot and inflammation: Secondary | ICD-10-CM

## 2014-05-22 DIAGNOSIS — I83013 Varicose veins of right lower extremity with ulcer of ankle: Secondary | ICD-10-CM

## 2014-05-22 DIAGNOSIS — L97319 Non-pressure chronic ulcer of right ankle with unspecified severity: Principal | ICD-10-CM

## 2014-05-22 DIAGNOSIS — I83218 Varicose veins of right lower extremity with both ulcer of other part of lower extremity and inflammation: Secondary | ICD-10-CM

## 2014-05-22 DIAGNOSIS — I83213 Varicose veins of right lower extremity with both ulcer of ankle and inflammation: Secondary | ICD-10-CM

## 2014-05-22 DIAGNOSIS — I83211 Varicose veins of right lower extremity with both ulcer of thigh and inflammation: Secondary | ICD-10-CM

## 2014-05-22 DIAGNOSIS — I83219 Varicose veins of right lower extremity with both ulcer of unspecified site and inflammation: Secondary | ICD-10-CM

## 2014-05-22 DIAGNOSIS — I83215 Varicose veins of right lower extremity with both ulcer other part of foot and inflammation: Secondary | ICD-10-CM

## 2014-05-22 NOTE — Progress Notes (Signed)
Patient is an 81 year old female who has been undergoing Unna boot therapy for a stasis ulcer on her right medial malleolus. She presents today after 4 weeks of Unna boot therapy. She states she has no pain in the ulcer. She occasionally takes a pain pill but rarely. She has previously undergone bilateral common iliac stenting in October 2015. At the time of her arteriogram she had bilateral moderate superficial femoral artery stenoses. 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   Review of systems: She denies any significant drainage from the wounds or fever. She denies chest pain or shortness of breath.   Physical exam:  Filed Vitals:   05/22/14 1226  BP: 168/78  Pulse: 63  Resp: 18  Height: 5' (1.524 m)  Weight: 135 lb (61.236 kg)    Right lower extremity: 3 mm less than 1 mm depth ulceration almost completely epithelialized. This is compared to 10 x 10 mm one month ago and 15 x 13 mm one month prior to that, some surrounding skin erythema bilateral gaiter area bilaterally. Thickened nails both feet. 2+ femoral pulses bilaterally  Assessment: Healing stasis ulcer right lower extremity  Plan: We will switch her to bilateral Ace wraps foot to knee level at this point. The ulcer is essentially healed. We will arrange for her to see a podiatrist for trimming of her nails. She will follow-up with me in 1 month to recheck her legs.    Fabienne Brunsharles Jarrad Mclees, MD [facility] [place] Office: [phone] Pager: [phone]

## 2014-05-30 ENCOUNTER — Other Ambulatory Visit: Payer: Self-pay | Admitting: Internal Medicine

## 2014-05-30 ENCOUNTER — Other Ambulatory Visit: Payer: Self-pay | Admitting: Nurse Practitioner

## 2014-06-02 ENCOUNTER — Telehealth: Payer: Self-pay

## 2014-06-02 NOTE — Telephone Encounter (Signed)
Patients daughter, Bonita QuinLinda aware, dpm

## 2014-06-02 NOTE — Telephone Encounter (Signed)
-----   Message from Sherren Kernsharles E Fields, MD sent at 05/31/2014  9:26 AM EST ----- Regarding: RE: pt. declining Wound Center Yes unna boot x 4 then me ----- Message -----    From: Phillips Odorarol S Cedra Villalon, RN    Sent: 05/30/2014  12:08 PM      To: Sherren Kernsharles E Fields, MD Subject: RE: pt. declining Wound Center                 Do you want us to schedule an Unna boot weekly, and to see you at the end of 4 wks. or bring her in to see you 1st?  ----- Message -----    From: Sherren Kernsharles E Fields, MD    Sent: 05/30/2014  11:55 AM      To: Phillips Odorarol S Sherlonda Flater, RN Subject: RE: pt. declining Wound Center                 We can see her ----- Message -----    From: Phillips Odorarol S Kaylin Marcon, RN    Sent: 05/30/2014  10:01 AM      To: Sherren Kernsharles E Fields, MD Subject: pt. declining Wound Center                     The pt's daughter called and said the pt. "doesn't have any faith in the Wound Center".  Also said [facility], and there was no improvement in her wound.  Pt's daughter is asking me to readdress this with you, to ask if you would continue to treat her.  Do you want us to suggest changing to a different wound center?

## 2014-06-02 NOTE — Telephone Encounter (Signed)
Daughter reported on 05/29/14 that the right lower extremity ulcer was opening-up again.  Described it to be approx. dime-sized and appeared moist.  Reported pt. Requested to see Dr. Darrick PennaFields.  [facility] Dar[facility]Fields[facility] and then to see him in the office.

## 2014-06-03 ENCOUNTER — Telehealth: Payer: Self-pay | Admitting: Nurse Practitioner

## 2014-06-03 ENCOUNTER — Encounter (HOSPITAL_COMMUNITY): Payer: Self-pay

## 2014-06-03 ENCOUNTER — Inpatient Hospital Stay (HOSPITAL_COMMUNITY)
Admission: EM | Admit: 2014-06-03 | Discharge: 2014-06-09 | DRG: 689 | Disposition: A | Discharge status: Home / Self Care | Payer: Medicare Other | Attending: Internal Medicine | Admitting: Internal Medicine

## 2014-06-03 ENCOUNTER — Encounter: Payer: Self-pay | Admitting: Nurse Practitioner

## 2014-06-03 ENCOUNTER — Emergency Department (HOSPITAL_COMMUNITY): Payer: Medicare Other

## 2014-06-03 ENCOUNTER — Ambulatory Visit (INDEPENDENT_AMBULATORY_CARE_PROVIDER_SITE_OTHER): Payer: Medicare Other | Admitting: Nurse Practitioner

## 2014-06-03 VITALS — HR 76 | Temp 102.0°F | Ht 60.0 in | Wt 145.4 lb

## 2014-06-03 DIAGNOSIS — M109 Gout, unspecified: Secondary | ICD-10-CM | POA: Diagnosis present

## 2014-06-03 DIAGNOSIS — Z888 Allergy status to other drugs, medicaments and biological substances status: Secondary | ICD-10-CM | POA: Diagnosis not present

## 2014-06-03 DIAGNOSIS — N390 Urinary tract infection, site not specified: Secondary | ICD-10-CM | POA: Diagnosis present

## 2014-06-03 DIAGNOSIS — D631 Anemia in chronic kidney disease: Secondary | ICD-10-CM | POA: Diagnosis present

## 2014-06-03 DIAGNOSIS — K5900 Constipation, unspecified: Secondary | ICD-10-CM | POA: Diagnosis present

## 2014-06-03 DIAGNOSIS — R0902 Hypoxemia: Secondary | ICD-10-CM | POA: Diagnosis present

## 2014-06-03 DIAGNOSIS — Z880 Allergy status to penicillin: Secondary | ICD-10-CM | POA: Diagnosis not present

## 2014-06-03 DIAGNOSIS — Z8673 Personal history of transient ischemic attack (TIA), and cerebral infarction without residual deficits: Secondary | ICD-10-CM | POA: Diagnosis not present

## 2014-06-03 DIAGNOSIS — R509 Fever, unspecified: Secondary | ICD-10-CM

## 2014-06-03 DIAGNOSIS — E785 Hyperlipidemia, unspecified: Secondary | ICD-10-CM | POA: Diagnosis present

## 2014-06-03 DIAGNOSIS — I252 Old myocardial infarction: Secondary | ICD-10-CM

## 2014-06-03 DIAGNOSIS — J189 Pneumonia, unspecified organism: Secondary | ICD-10-CM | POA: Diagnosis not present

## 2014-06-03 DIAGNOSIS — E1165 Type 2 diabetes mellitus with hyperglycemia: Secondary | ICD-10-CM | POA: Diagnosis present

## 2014-06-03 DIAGNOSIS — N3000 Acute cystitis without hematuria: Secondary | ICD-10-CM

## 2014-06-03 DIAGNOSIS — I5032 Chronic diastolic (congestive) heart failure: Secondary | ICD-10-CM | POA: Diagnosis present

## 2014-06-03 DIAGNOSIS — L97929 Non-pressure chronic ulcer of unspecified part of left lower leg with unspecified severity: Secondary | ICD-10-CM | POA: Diagnosis present

## 2014-06-03 DIAGNOSIS — I129 Hypertensive chronic kidney disease with stage 1 through stage 4 chronic kidney disease, or unspecified chronic kidney disease: Secondary | ICD-10-CM | POA: Diagnosis present

## 2014-06-03 DIAGNOSIS — Z7982 Long term (current) use of aspirin: Secondary | ICD-10-CM | POA: Diagnosis not present

## 2014-06-03 DIAGNOSIS — I739 Peripheral vascular disease, unspecified: Secondary | ICD-10-CM | POA: Diagnosis present

## 2014-06-03 DIAGNOSIS — Y95 Nosocomial condition: Secondary | ICD-10-CM | POA: Diagnosis not present

## 2014-06-03 DIAGNOSIS — Z882 Allergy status to sulfonamides status: Secondary | ICD-10-CM | POA: Diagnosis not present

## 2014-06-03 DIAGNOSIS — N183 Chronic kidney disease, stage 3 unspecified: Secondary | ICD-10-CM | POA: Diagnosis present

## 2014-06-03 DIAGNOSIS — I2510 Atherosclerotic heart disease of native coronary artery without angina pectoris: Secondary | ICD-10-CM | POA: Diagnosis present

## 2014-06-03 DIAGNOSIS — Z87891 Personal history of nicotine dependence: Secondary | ICD-10-CM | POA: Diagnosis not present

## 2014-06-03 DIAGNOSIS — I255 Ischemic cardiomyopathy: Secondary | ICD-10-CM | POA: Diagnosis present

## 2014-06-03 LAB — CBC WITH DIFFERENTIAL/PLATELET
BASOS PCT: 0 % (ref 0–1)
Basophils Absolute: 0 10*3/uL (ref 0.0–0.1)
Eosinophils Absolute: 0 10*3/uL (ref 0.0–0.7)
Eosinophils Relative: 0 % (ref 0–5)
HCT: 35.7 % — ABNORMAL LOW (ref 36.0–46.0)
Hemoglobin: 11.5 g/dL — ABNORMAL LOW (ref 12.0–15.0)
LYMPHS ABS: 1 10*3/uL (ref 0.7–4.0)
Lymphocytes Relative: 15 % (ref 12–46)
MCH: 29.4 pg (ref 26.0–34.0)
MCHC: 32.2 g/dL (ref 30.0–36.0)
MCV: 91.3 fL (ref 78.0–100.0)
MONO ABS: 0.4 10*3/uL (ref 0.1–1.0)
Monocytes Relative: 6 % (ref 3–12)
NEUTROS PCT: 79 % — AB (ref 43–77)
Neutro Abs: 5.4 10*3/uL (ref 1.7–7.7)
PLATELETS: 145 10*3/uL — AB (ref 150–400)
RBC: 3.91 MIL/uL (ref 3.87–5.11)
RDW: 15.2 % (ref 11.5–15.5)
WBC: 6.8 10*3/uL (ref 4.0–10.5)

## 2014-06-03 LAB — BRAIN NATRIURETIC PEPTIDE: B NATRIURETIC PEPTIDE 5: 530.3 pg/mL — AB (ref 0.0–100.0)

## 2014-06-03 LAB — URINALYSIS, ROUTINE W REFLEX MICROSCOPIC
GLUCOSE, UA: 100 mg/dL — AB
Ketones, ur: NEGATIVE mg/dL
Nitrite: NEGATIVE
PH: 5.5 (ref 5.0–8.0)
Specific Gravity, Urine: >1.03 — ABNORMAL HIGH (ref 1.005–1.030)
Urobilinogen, UA: 0.2 mg/dL (ref 0.0–1.0)

## 2014-06-03 LAB — COMPREHENSIVE METABOLIC PANEL
ALBUMIN: 2.8 g/dL — AB (ref 3.5–5.2)
ALT: 10 U/L (ref 0–35)
ANION GAP: 4 — AB (ref 5–15)
AST: 18 U/L (ref 0–37)
Alkaline Phosphatase: 67 U/L (ref 39–117)
BUN: 25 mg/dL — ABNORMAL HIGH (ref 6–23)
CALCIUM: 8.4 mg/dL (ref 8.4–10.5)
CO2: 27 mmol/L (ref 19–32)
CREATININE: 1.61 mg/dL — AB (ref 0.50–1.10)
Chloride: 107 mmol/L (ref 96–112)
GFR calc Af Amer: 33 mL/min — ABNORMAL LOW (ref >90)
GFR, EST NON AFRICAN AMERICAN: 28 mL/min — AB (ref >90)
Glucose, Bld: 182 mg/dL — ABNORMAL HIGH (ref 70–99)
Potassium: 3.5 mmol/L (ref 3.5–5.1)
Sodium: 138 mmol/L (ref 135–145)
Total Bilirubin: 0.4 mg/dL (ref 0.3–1.2)
Total Protein: 5.5 g/dL — ABNORMAL LOW (ref 6.0–8.3)

## 2014-06-03 LAB — URINE MICROSCOPIC-ADD ON

## 2014-06-03 LAB — I-STAT TROPONIN, ED: Troponin i, poc: 0.04 ng/mL (ref 0.00–0.08)

## 2014-06-03 LAB — I-STAT CG4 LACTIC ACID, ED: Lactic Acid, Venous: 1.31 mmol/L (ref 0.5–2.0)

## 2014-06-03 MED ORDER — CLONIDINE HCL 0.1 MG PO TABS
0.1000 mg | ORAL_TABLET | Freq: Every day | ORAL | Status: DC
  Administered 2014-06-04 – 2014-06-05 (×3): 0.1 mg via ORAL
  Administered 2014-06-07: 0.2 mg via ORAL
  Administered 2014-06-07 – 2014-06-08 (×2): 0.1 mg via ORAL
  Filled 2014-06-03 (×6): qty 1

## 2014-06-03 MED ORDER — OMEGA-3-ACID ETHYL ESTERS 1 G PO CAPS
1.0000 g | ORAL_CAPSULE | Freq: Two times a day (BID) | ORAL | Status: DC
  Administered 2014-06-04 – 2014-06-08 (×11): 1 g via ORAL
  Filled 2014-06-03 (×12): qty 1

## 2014-06-03 MED ORDER — SODIUM CHLORIDE 0.9 % IV BOLUS (SEPSIS)
500.0000 mL | Freq: Once | INTRAVENOUS | Status: AC
  Administered 2014-06-03: 500 mL via INTRAVENOUS

## 2014-06-03 MED ORDER — ACETAMINOPHEN 325 MG PO TABS
ORAL_TABLET | ORAL | Status: AC
  Administered 2014-06-04: 650 mg via ORAL
  Filled 2014-06-03: qty 1

## 2014-06-03 MED ORDER — ACETAMINOPHEN 325 MG PO TABS
650.0000 mg | ORAL_TABLET | Freq: Four times a day (QID) | ORAL | Status: DC | PRN
  Administered 2014-06-03 – 2014-06-08 (×5): 650 mg via ORAL
  Filled 2014-06-03 (×5): qty 2

## 2014-06-03 MED ORDER — DEXTROSE 5 % IV SOLN
1.0000 g | Freq: Once | INTRAVENOUS | Status: AC
  Administered 2014-06-03: 1 g via INTRAVENOUS
  Filled 2014-06-03: qty 10

## 2014-06-03 MED ORDER — ASPIRIN 81 MG PO CHEW
81.0000 mg | CHEWABLE_TABLET | Freq: Every day | ORAL | Status: DC
  Administered 2014-06-04 – 2014-06-09 (×6): 81 mg via ORAL
  Filled 2014-06-03 (×11): qty 1

## 2014-06-03 MED ORDER — TIMOLOL MALEATE 0.5 % OP SOLN
1.0000 [drp] | Freq: Every day | OPHTHALMIC | Status: DC
  Administered 2014-06-05 – 2014-06-06 (×2): 1 [drp] via OPHTHALMIC
  Filled 2014-06-03: qty 5

## 2014-06-03 MED ORDER — EZETIMIBE-SIMVASTATIN 10-20 MG PO TABS
0.5000 | ORAL_TABLET | Freq: Every day | ORAL | Status: DC
  Administered 2014-06-05 – 2014-06-08 (×4): 0.5 via ORAL
  Filled 2014-06-03 (×7): qty 0.5

## 2014-06-03 MED ORDER — POLYMYXIN B-TRIMETHOPRIM 10000-0.1 UNIT/ML-% OP SOLN
1.0000 [drp] | Freq: Two times a day (BID) | OPHTHALMIC | Status: DC
  Administered 2014-06-04 – 2014-06-06 (×4): 1 [drp] via OPHTHALMIC
  Filled 2014-06-03 (×2): qty 10

## 2014-06-03 MED ORDER — HEPARIN SODIUM (PORCINE) 5000 UNIT/ML IJ SOLN
5000.0000 [IU] | Freq: Three times a day (TID) | INTRAMUSCULAR | Status: DC
  Administered 2014-06-04 – 2014-06-09 (×16): 5000 [IU] via SUBCUTANEOUS
  Filled 2014-06-03 (×16): qty 1

## 2014-06-03 MED ORDER — COLESEVELAM HCL 625 MG PO TABS
625.0000 mg | ORAL_TABLET | Freq: Two times a day (BID) | ORAL | Status: DC
  Administered 2014-06-04 – 2014-06-08 (×10): 625 mg via ORAL
  Filled 2014-06-03 (×13): qty 1

## 2014-06-03 MED ORDER — CARVEDILOL 3.125 MG PO TABS
3.1250 mg | ORAL_TABLET | Freq: Two times a day (BID) | ORAL | Status: DC
  Administered 2014-06-04 – 2014-06-09 (×11): 3.125 mg via ORAL
  Filled 2014-06-03 (×11): qty 1

## 2014-06-03 MED ORDER — ACETAMINOPHEN-CODEINE #3 300-30 MG PO TABS
1.0000 | ORAL_TABLET | ORAL | Status: DC | PRN
Start: 2014-06-03 — End: 2014-06-09

## 2014-06-03 MED ORDER — DEXTROSE 5 % IV SOLN
1.0000 g | INTRAVENOUS | Status: DC
  Filled 2014-06-03: qty 10

## 2014-06-03 NOTE — ED Notes (Signed)
Pt stated that she is having bad groin pain, causing her to shake. Nurse Notified.

## 2014-06-03 NOTE — ED Notes (Signed)
Pt also noted to have a pressure ulcer on her rt ankle. Minimal drainage and reddness noted.

## 2014-06-03 NOTE — ED Notes (Signed)
Pt. s stats dropped down to

## 2014-06-03 NOTE — ED Notes (Signed)
Per pts family- pt has had generalized weakness, nausea, vomiting, decreased appetite for 4 days. Family states that pt went to the dr today and was told that she "probably has the flu".

## 2014-06-03 NOTE — Telephone Encounter (Signed)
Called pt regarding Flu Shot. No ans..[name]

## 2014-06-03 NOTE — Progress Notes (Signed)
CARDIOLOGY OFFICE NOTE  Date:  06/03/2014    Ashley Rheinoris E Lingafelter Date of Birth: 05-16-1927 Medical Record #[id number]  PCP:  Sharon SellerEUBANKS, JESSICA K, NP  Cardiologist:  Cooper/Windie Marasco  No chief complaint on file.    History of Present Illness: [name] is a 81 y.o. female who presents today for a follow up visit. Seen for Dr. Excell Seltzerooper and followed closely by me. She has multiple issues which include CAD, remote PCI, BMS to the RCA and DES to the DX in January of 2012. She has longstanding diabetes, HTN, HLD, diastolic heart failure with EF 55 to 60% per cath back in 2012 but has been as low as 35%, PVD (seeing Dr. Darrick PennaFields), gout, CKD and chronic anemia.   Seen back in July of 2014 by me and seemed to be doing ok. Seen back in November of 2014 by PCP for some ongoing issues with anxiety/BP. Has had lisinopril added back along with some Xanax/Celexa. Having personal issues with a live in man that she does not want to evict as well as family issues in general. She has always cared for many animals in and at her home and had lots of issues with family.   I saw her back in January of 2015. Medicines had been adjusted for her BP. Still with lots of social issues with her home situation. I saw her in May of 2015- she had seen Dr. Darrick PennaFields recently for a foot ulcer - managed conservatively. She ultimately underwent bilateral common iliac stenting on January 17, 2014. 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 She did have a postprocedure hematoma in the groin but this is now resolved. She has been seen at the wound center for intermittent debridement of her right medial malleolus ulcer. She was placed in an Radio broadcast assistantUnna boot.   Seen in December of 2015 - off lots of her medicines. BP was up. Clonidine and Norvasc were restarted. She was on a lower dose of ARB. Last seen by me in  January - she was doing better. Cardiac status stable.  Comes in today. Here with her daughter today. Moaning and groaning. Can hardly keep her eyes open. Says she is not eating. Feels "bad all over".   Past Medical History  Diagnosis Date  . Coronary artery disease     Remote PCI. Had BMS to RCA and DES stent to Diagonal in Jan. 2012   . Diabetes mellitus     long standing  . Diastolic heart failure     EF 55 to 60% per cath in Jan 2012  . Hyperlipidemia   . Hypertension   . Carotid bruit   . Gout   . Obesity   . TIA (transient ischemic attack)   . Chronic kidney disease     Stage 3  . Ischemic cardiomyopathy     with prior EF of 35%  . Anemia   . Proteinuria   . NSTEMI (non-ST elevated myocardial infarction) Jan 2012    with PCI to RCA and DX per Dr. Excell Seltzerooper  . Carotid artery occlusion   . Stroke 2013    ?  mini    . DVT (deep venous thrombosis)     Past Surgical History  Procedure Laterality Date  . Appendectomy    . Tonsillectomy    . Left ankle fusion    . Distal rca  04/05/2010    stent  . Fracture surgery Right 2005    bilateral ankles   . Abdominal aortagram N/A 01/17/2014    Procedure: ABDOMINAL AORTAGRAM;  Surgeon: Sherren Kerns, MD;  Location: [facility] C[facility] LAB;  Service: Cardiovascular;  Laterality: N/A;     Medications: Current Outpatient Prescriptions  Medication Sig Dispense Refill  . aspirin 81 MG tablet Take 81 mg by mouth daily. For heart health    . Bromfenac Sodium (PROLENSA) 0.07 % SOLN Place 1 drop into the left eye daily.     . carvedilol (COREG) 3.125 MG tablet Take 1 tablet (3.125 mg total) by mouth daily. 60 tablet 2  . cloNIDine (CATAPRES) 0.1 MG tablet Take 1 tablet (0.1 mg total) by mouth at bedtime. 30 tablet   . codeine 15 MG tablet Take 1 tablet (15 mg total) by mouth every 6 (six) hours as needed (may take one half tablet instead of whole tablet). 60 tablet 0  . glucose blood (ONE TOUCH ULTRA TEST) test strip 1 each by Other route See  admin instructions. Check blood sugar daily as needed.    Marland Kitchen LOTEMAX 0.5 % ophthalmic suspension Place 1 drop into the left eye 3 (three) times daily.     . nitroGLYCERIN (NITROSTAT) 0.4 MG SL tablet Dissolve one tablet under tongue as needed for chest pain. May repeat as needed every 5 minutes up to 3 doses. Seek medical attention if pain is not relieved after 3 doses 25 tablet 11  . omega-3 acid ethyl esters (LOVAZA) 1 G capsule TAKE 2 CAPSULES BY MOUTH EVERY NIGHT AT BEDTIME FOR 30 DAYS 60 capsule 5  . SANTYL ointment Apply 1 application topically daily.     . timolol (TIMOPTIC) 0.5 % ophthalmic solution Place 1 drop into the left eye daily.     Marland Kitchen trimethoprim-polymyxin b (POLYTRIM) ophthalmic solution Place 1 drop into both eyes every 4 (four) hours.     Marland Kitchen VYTORIN 10-20 MG per tablet TAKE 1/2 TABLET BY MOUTH EVERY NIGHT AT BEDTIME FOR 30 DAYS 45 tablet 1  . WELCHOL 625 MG tablet TAKE 1 TABLET BY MOUTH TWICE DAILY WITH MEALS 30 tablet 5  . WELCHOL 625 MG tablet TAKE 1 TABLET BY MOUTH TWICE DAILY WITH MEALS 60 tablet 5   No current facility-administered medications for this visit.    Allergies: Allergies  Allergen Reactions  . Sulfa Antibiotics Other (See Comments)    Tongue swells  . Percocet [Oxycodone-Acetaminophen] Other (See Comments)  . Penicillins Hives    Social History: The patient  reports that she quit smoking about 46 years ago. She has never used smokeless tobacco. She reports that she does not drink alcohol or use illicit drugs.   Family History: The patient's family history is not on file.   Review of Systems: Please see the history of present illness.     Physical Exam: VS:  Pulse 76  Temp(Src) 102 F (38.9 C)  Ht 5' (1.524 m)  Wt 145 lb 6.4 oz (65.953 kg)  BMI 28.40 kg/m2  SpO2 92% .  BMI Body mass index is 28.4 kg/(m^2).  Wt Readings from Last 3 Encounters:  06/03/14 145 lb 6.4 oz (65.953 kg)  05/22/14 135 lb (61.236 kg)  04/24/14 135 lb (61.236 kg)     General: She looks ill. Can hardly keep her eyes open. Moaning. Fever of 102 noted.  She was otherwise not examined  LABORATORY DATA:  EKG:  EKG is not ordered today.  Lab Results  Component Value Date   WBC 5.8 04/02/2014   HGB 10.4* 04/02/2014   HCT 32.2* 04/02/2014   PLT 163.0 04/02/2014   GLUCOSE 146* 04/02/2014   CHOL 127 02/10/2014   TRIG 151* 02/10/2014   HDL 43 02/10/2014   LDLCALC 54 02/10/2014   ALT 10 02/10/2014   AST 15 02/10/2014   NA 140 04/02/2014   K 4.8 04/02/2014   CL 107 04/02/2014   CREATININE 1.25* 04/02/2014   BUN 31* 04/02/2014   CO2 26 04/02/2014   TSH 4.25 05/01/2014   INR 1.09 04/15/2010   HGBA1C 7.5* 01/17/2014    BNP (last 3 results) No results for input(s): BNP in the last 8760 hours.  ProBNP (last 3 results) No results for input(s): PROBNP in the last 8760 hours.   Other Studies Reviewed Today:   Assessment/Plan: 1. Febrile illness - probable flu - she is referred to the ER for further care and evaluation. Her appointment with me will get rescheduled.   Current medicines are reviewed with the patient today.  The patient does not have concerns regarding medicines other than what has been noted above.  The following changes have been made:  N/A  Labs/ tests ordered today include:   No orders of the defined types were placed in this encounter.     Disposition:     Patient and daughter are agreeable to this plan and will call if any problems develop in the interim.   Signed: Rosalio Macadamia, RN, ANP-C 06/03/2014 2:50 PM  [facility] [address] [place], [place]  [address] Phone: [phone] Fax: [phone]

## 2014-06-03 NOTE — H&P (Signed)
[facility] History and Physical  Ashley RheinDoris E Kinkade ZOX:[id number] DOB: 12/05/1927 DOA: 06/03/2014  Referring physician: EDP PCP: Sharon SellerEUBANKS, JESSICA K, NP   Chief Complaint: Fever, groin pain   HPI: [name] is a 81 y.o. female who presents to the ED with groin pain.  Pain is located in her R groin and lower abdomen.  No dysuria.  Pain causes her to "shake" she states (turns out this is actually rigors with fever of 102.0).  Pain comes and goes, is severe when present but absent when not.  When pain is present, moving her leg worsens it, she did ambulate from car to wheelchair without trouble so can bear weight on the leg.  No foot nor ankle pain.  Does have an ulcer on the LLE foot.  Has had stents to LLE for PVD.  No recent trauma to leg.  Review of Systems: Systems reviewed.  As above, otherwise negative  Past Medical History  Diagnosis Date  . Coronary artery disease     Remote PCI. Had BMS to RCA and DES stent to Diagonal in Jan. 2012   . Diabetes mellitus     long standing  . Diastolic heart failure     EF 55 to 60% per cath in Jan 2012  . Hyperlipidemia   . Hypertension   . Carotid bruit   . Gout   . Obesity   . TIA (transient ischemic attack)   . Chronic kidney disease     Stage 3  . Ischemic cardiomyopathy     with prior EF of 35%  . Anemia   . Proteinuria   . NSTEMI (non-ST elevated myocardial infarction) Jan 2012    with PCI to RCA and DX per Dr. Excell Seltzerooper  . Carotid artery occlusion   . Stroke 2013    ?  mini    . DVT (deep venous thrombosis)    Past Surgical History  Procedure Laterality Date  . Appendectomy    . Tonsillectomy    . Left ankle fusion    . Distal rca  04/05/2010    stent  . Fracture surgery Right 2005    bilateral ankles   . Abdominal aortagram N/A 01/17/2014    Procedure: ABDOMINAL AORTAGRAM;  Surgeon: Sherren Kernsharles E Fields, MD;  [facility];  Service: Cardiovascular;  Laterality: N/A;   Social History:  reports that she  quit smoking about 46 years ago. She has never used smokeless tobacco. She reports that she does not drink alcohol or use illicit drugs.  Allergies  Allergen Reactions  . Percocet [Oxycodone-Acetaminophen] Other (See Comments)  . Sulfa Antibiotics Other (See Comments)    Tongue swells  . Penicillins Hives    No family history on file.   Prior to Admission medications   Medication Sig Start Date End Date Taking? Authorizing Provider  aspirin 81 MG tablet Take 81 mg by mouth daily. For heart health   Yes Historical Provider, MD  carvedilol (COREG) 3.125 MG tablet Take 1 tablet (3.125 mg total) by mouth daily. Patient taking differently: Take 3.125 mg by mouth 2 (two) times daily with a meal.  04/02/14  Yes Rosalio MacadamiaLori C Gerhardt, NP  cloNIDine (CATAPRES) 0.1 MG tablet Take 1 tablet (0.1 mg total) by mouth at bedtime. 04/02/14  Yes Rosalio MacadamiaLori C Gerhardt, NP  omega-3 acid ethyl esters (LOVAZA) 1 G capsule TAKE 2 CAPSULES BY MOUTH EVERY NIGHT AT BEDTIME FOR 30 DAYS Patient taking differently: TAKE 1 CAPSULE BY MOUTH EVERY MORNING  AND 1 CAPSULE IN  EVENING 06/02/14  Yes Kimber Relic, MD  timolol (TIMOPTIC) 0.5 % ophthalmic solution Place 1 drop into the left eye daily.  11/22/13  Yes Historical Provider, MD  trimethoprim-polymyxin b (POLYTRIM) ophthalmic solution Place 1 drop into both eyes 2 (two) times daily.  11/28/13  Yes Historical Provider, MD  VYTORIN 10-20 MG per tablet TAKE 1/2 TABLET BY MOUTH EVERY NIGHT AT BEDTIME FOR 30 DAYS 06/02/14  Yes Sharon Seller, NP  WELCHOL 625 MG tablet TAKE 1 TABLET BY MOUTH TWICE DAILY WITH MEALS 03/17/14  Yes Sharon Seller, NP  nitroGLYCERIN (NITROSTAT) 0.4 MG SL tablet Dissolve one tablet under tongue as needed for chest pain. May repeat as needed every 5 minutes up to 3 doses. Seek medical attention if pain is not relieved after 3 doses 08/07/13   Kimber Relic, MD  [facility] 625 MG tablet TAKE 1 TABLET BY MOUTH TWICE DAILY WITH MEALS 06/02/14   Sharon Seller, NP    Physical Exam: Filed Vitals:   06/03/14 2252  BP: 177/61  Pulse: 96  Temp:   Resp: 17    BP 177/61 mmHg  Pulse 96  Temp(Src) 98.8 F (37.1 C) (Oral)  Resp 17  Wt 65.772 kg (145 lb)  SpO2 91%  General Appearance:    Alert, oriented, no distress, appears stated age  Head:    Normocephalic, atraumatic  Eyes:    PERRL, EOMI, sclera non-icteric        Nose:   Nares without drainage or epistaxis. Mucosa, turbinates normal  Throat:   Moist mucous membranes. Oropharynx without erythema or exudate.  Neck:   Supple. No carotid bruits.  No thyromegaly.  No lymphadenopathy.   Back:     No CVA tenderness, no spinal tenderness  Lungs:     Clear to auscultation bilaterally, without wheezes, rhonchi or rales  Chest wall:    No tenderness to palpitation  Heart:    Regular rate and rhythm without murmurs, gallops, rubs  Abdomen:     Soft, non-tender, nondistended, normal bowel sounds, no organomegaly  Genitalia:    deferred  Rectal:    deferred  Extremities:   No clubbing, cyanosis or edema.  Pulses:   2+ and symmetric all extremities  Skin:   Chronic stasis dermatitis of LLE, has a patch over an ulcer on her foot, no mottling or necrosis of foot, foot is warm, pulse present.  Lymph nodes:   Cervical, supraclavicular, and axillary nodes normal  Neurologic:   CNII-XII intact. Normal strength, sensation and reflexes      throughout    Labs on Admission:  Basic Metabolic Panel:  Recent Labs Lab 06/03/14 1838  NA 138  K 3.5  CL 107  CO2 27  GLUCOSE 182*  BUN 25*  CREATININE 1.61*  CALCIUM 8.4   Liver Function Tests:  Recent Labs Lab 06/03/14 1838  AST 18  ALT 10  ALKPHOS 67  BILITOT 0.4  PROT 5.5*  ALBUMIN 2.8*   No results for input(s): LIPASE, AMYLASE in the last 168 hours. No results for input(s): AMMONIA in the last 168 hours. CBC:  Recent Labs Lab 06/03/14 1838  WBC 6.8  NEUTROABS 5.4  HGB 11.5*  HCT 35.7*  MCV 91.3  PLT 145*   Cardiac Enzymes: No  results for input(s): CKTOTAL, CKMB, CKMBINDEX, TROPONINI in the last 168 hours.  BNP (last 3 results) No results for input(s): PROBNP in the last 8760 hours. CBG: No results  for input(s): GLUCAP in the last 168 hours.  Radiological Exams on Admission: Dg Chest 2 View  06/03/2014   CLINICAL DATA:  Generalized weakness and anorexia for 4 day  EXAM: CHEST  2 VIEW  COMPARISON:  12/29/2013  FINDINGS: Mild cardiomegaly. Scarring at the lateral left lower lung zone. No pleural effusion. No pneumothorax. Kyphosis of the lower thoracic spine. Thoracic spine is stable. No pneumothorax. No definite acute bony deformity.  IMPRESSION: Cardiomegaly without decompensation.  Chronic changes.   Electronically Signed   By: Jolaine Click M.D.   On: 06/03/2014 17:52    EKG: Independently reviewed.  Assessment/Plan Principal Problem:   Urinary tract infection Active Problems:   Chronic kidney disease, stage III (moderate)   Fever   1. UTI with fever - 1. Rocephin 2. Cultures pending 3. No flank or back pain but given the presence of groin pain will go ahead and do CT abd/pelvis w/o contrast to r/o stone. 4. Tylenol for fever 5. Tylenol 3 for pain 2. CKD stage 3 - repeat BMP in AM to monitor, creatinine appears to be right around baseline, perhaps slightly above    Code Status: Full  Family Communication: No family in room Disposition Plan: Admit to inpatient   Time spent: 70 min  Demica Zook M. [facility] Pager [phone]  If 7AM-7PM, please contact the day team taking care of the patient [email or web address] Password [email or web address] 06/03/2014, 11:40 PM

## 2014-06-03 NOTE — ED Provider Notes (Signed)
CSN: [id number]     Arrival date & time 06/03/14  1518 History   First MD Initiated Contact with Patient 06/03/14 1758     Chief Complaint  Patient presents with  . Influenza     (Consider location/radiation/quality/duration/timing/severity/associated sxs/prior Treatment) Patient is a 81 y.o. female presenting with general illness.  Illness Location:  Generalized Quality:  Weakness, nausea, vomiting, constipation Severity:  Moderate Onset quality:  Gradual Duration:  2 weeks Timing:  Constant Progression:  Worsening Chronicity:  New Context:  CAD, DM, pressure ulcer, seen by cardiology today and sent for further evaluation Relieved by:  Nothing Worsened by:  Nothing Associated symptoms: cough, nausea, shortness of breath and vomiting   Associated symptoms: no abdominal pain, no chest pain, no diarrhea, no fever (but with subj chills), no rash and no rhinorrhea     Past Medical History  Diagnosis Date  . Coronary artery disease     Remote PCI. Had BMS to RCA and DES stent to Diagonal in Jan. 2012   . Diabetes mellitus     long standing  . Diastolic heart failure     EF 55 to 60% per cath in Jan 2012  . Hyperlipidemia   . Hypertension   . Carotid bruit   . Gout   . Obesity   . TIA (transient ischemic attack)   . Chronic kidney disease     Stage 3  . Ischemic cardiomyopathy     with prior EF of 35%  . Anemia   . Proteinuria   . NSTEMI (non-ST elevated myocardial infarction) Jan 2012    with PCI to RCA and DX per Dr. Excell Seltzerooper  . Carotid artery occlusion   . Stroke 2013    ?  mini    . DVT (deep venous thrombosis)    Past Surgical History  Procedure Laterality Date  . Appendectomy    . Tonsillectomy    . Left ankle fusion    . Distal rca  04/05/2010    stent  . Fracture surgery Right 2005    bilateral ankles   . Abdominal aortagram N/A 01/17/2014    Procedure: ABDOMINAL AORTAGRAM;  Surgeon: Sherren Kernsharles E Fields, MD;  [facility];  Service:  Cardiovascular;  Laterality: N/A;   No family history on file. History  Substance Use Topics  . Smoking status: Former Smoker    Quit date: 03/21/1968  . Smokeless tobacco: Never Used  . Alcohol Use: No   OB History    No data available     Review of Systems  Constitutional: Negative for fever (but with subj chills).  HENT: Negative for rhinorrhea.   Respiratory: Positive for cough and shortness of breath.   Cardiovascular: Negative for chest pain.  Gastrointestinal: Positive for nausea and vomiting. Negative for abdominal pain and diarrhea.  Skin: Negative for rash.  All other systems reviewed and are negative.     Allergies  Percocet; Sulfa antibiotics; and Penicillins  Home Medications   Prior to Admission medications   Medication Sig Start Date End Date Taking? Authorizing Provider  aspirin 81 MG tablet Take 81 mg by mouth daily. For heart health   Yes Historical Provider, MD  carvedilol (COREG) 3.125 MG tablet Take 1 tablet (3.125 mg total) by mouth daily. Patient taking differently: Take 3.125 mg by mouth 2 (two) times daily with a meal.  04/02/14  Yes Rosalio MacadamiaLori C Gerhardt, NP  cloNIDine (CATAPRES) 0.1 MG tablet Take 1 tablet (0.1 mg total) by mouth at  bedtime. 04/02/14  Yes Rosalio Macadamia, NP  omega-3 acid ethyl esters (LOVAZA) 1 G capsule TAKE 2 CAPSULES BY MOUTH EVERY NIGHT AT BEDTIME FOR 30 DAYS Patient taking differently: TAKE 1 CAPSULE BY MOUTH EVERY MORNING AND 1 CAPSULE IN  EVENING 06/02/14  Yes Kimber Relic, MD  timolol (TIMOPTIC) 0.5 % ophthalmic solution Place 1 drop into the left eye daily.  11/22/13  Yes Historical Provider, MD  trimethoprim-polymyxin b (POLYTRIM) ophthalmic solution Place 1 drop into both eyes 2 (two) times daily.  11/28/13  Yes Historical Provider, MD  VYTORIN 10-20 MG per tablet TAKE 1/2 TABLET BY MOUTH EVERY NIGHT AT BEDTIME FOR 30 DAYS 06/02/14  Yes Sharon Seller, NP  WELCHOL 625 MG tablet TAKE 1 TABLET BY MOUTH TWICE DAILY WITH MEALS  03/17/14  Yes Sharon Seller, NP  nitroGLYCERIN (NITROSTAT) 0.4 MG SL tablet Dissolve one tablet under tongue as needed for chest pain. May repeat as needed every 5 minutes up to 3 doses. Seek medical attention if pain is not relieved after 3 doses 08/07/13   Kimber Relic, MD  [facility] 625 MG tablet TAKE 1 TABLET BY MOUTH TWICE DAILY WITH MEALS 06/02/14   Sharon Seller, NP   BP 177/61 mmHg  Pulse 96  Temp(Src) 98.8 F (37.1 C) (Oral)  Resp 17  Wt 145 lb (65.772 kg)  SpO2 91% Physical Exam  Constitutional: She is oriented to person, place, and time. She appears well-developed and well-nourished.  HENT:  Head: Normocephalic and atraumatic.  Right Ear: External ear normal.  Left Ear: External ear normal.  Eyes: Conjunctivae and EOM are normal. Pupils are equal, round, and reactive to light.  Neck: Normal range of motion. Neck supple.  Cardiovascular: Normal rate, regular rhythm, normal heart sounds and intact distal pulses.   Pulmonary/Chest: Effort normal and breath sounds normal.  Abdominal: Soft. Bowel sounds are normal. There is no tenderness.  Musculoskeletal: Normal range of motion.  Neurological: She is alert and oriented to person, place, and time.  Skin: Skin is warm and dry.  Vitals reviewed.   ED Course  Procedures (including critical care time) Labs Review Labs Reviewed  CBC WITH DIFFERENTIAL/PLATELET - Abnormal; Notable for the following:    Hemoglobin 11.5 (*)    HCT 35.7 (*)    Platelets 145 (*)    Neutrophils Relative % 79 (*)    All other components within normal limits  COMPREHENSIVE METABOLIC PANEL - Abnormal; Notable for the following:    Glucose, Bld 182 (*)    BUN 25 (*)    Creatinine, Ser 1.61 (*)    Total Protein 5.5 (*)    Albumin 2.8 (*)    GFR calc non Af Amer 28 (*)    GFR calc Af Amer 33 (*)    Anion gap 4 (*)    All other components within normal limits  URINALYSIS, ROUTINE W REFLEX MICROSCOPIC - Abnormal; Notable for the following:     Specific Gravity, Urine >1.030 (*)    Glucose, UA 100 (*)    Hgb urine dipstick SMALL (*)    Bilirubin Urine SMALL (*)    Protein, ur >300 (*)    Leukocytes, UA SMALL (*)    All other components within normal limits  URINE MICROSCOPIC-ADD ON - Abnormal; Notable for the following:    Squamous Epithelial / LPF MANY (*)    Bacteria, UA FEW (*)    Casts GRANULAR CAST (*)    All other components  within normal limits  BRAIN NATRIURETIC PEPTIDE - Abnormal; Notable for the following:    B Natriuretic Peptide 530.3 (*)    All other components within normal limits  URINE CULTURE  CBC  BASIC METABOLIC PANEL  I-STAT CG4 LACTIC ACID, ED  Rosezena Sensor, ED    Imaging Review Dg Chest 2 View  06/03/2014   CLINICAL DATA:  Generalized weakness and anorexia for 4 day  EXAM: CHEST  2 VIEW  COMPARISON:  12/29/2013  FINDINGS: Mild cardiomegaly. Scarring at the lateral left lower lung zone. No pleural effusion. No pneumothorax. Kyphosis of the lower thoracic spine. Thoracic spine is stable. No pneumothorax. No definite acute bony deformity.  IMPRESSION: Cardiomegaly without decompensation.  Chronic changes.   Electronically Signed   By: Jolaine Click M.D.   On: 06/03/2014 17:52     EKG Interpretation   Date/Time:  Tuesday June 03 2014 19:50:58 EDT Ventricular Rate:  64 PR Interval:  164 QRS Duration: 126 QT Interval:  445 QTC Calculation: 459 R Axis:   -55 Text Interpretation:  Sinus rhythm Left bundle branch block Anterior  infarct, old Baseline wander No significant change since last tracing  Confirmed by Mirian Mo [id number]) on 06/03/2014 7:59:26 PM      MDM   Final diagnoses:  Acute cystitis without hematuria  Other specified fever    81 y.o. female with pertinent PMH of CAD, DM, dCHF, HTN, CKD presents with malaise, nausea, vomiting, and fever.  On arrival today vitals and physical exam as above.  Initially patient did not have any abdominal tenderness, did not endorse urinary  retention, no urinary symptoms.   Workup as above. The patient was hypoxic, required 2 L of  O2 to maintain sats. Urinalysis with signs of infection. Patient also began to complain of suprapubic abdominal tenderness. This was over her bladder. She had 300 mL of urine, had just urinated. Foley catheter placed. Rocephin given for potential urosepsis, patient admitted in stable condition.    I have reviewed all laboratory and imaging studies if ordered as above  1. Acute cystitis without hematuria   2. Fever   3. Other specified fever         Mirian Mo, MD 06/03/14 [id number]

## 2014-06-04 ENCOUNTER — Inpatient Hospital Stay (HOSPITAL_COMMUNITY): Payer: Medicare Other

## 2014-06-04 ENCOUNTER — Encounter: Payer: Self-pay | Admitting: Vascular Surgery

## 2014-06-04 ENCOUNTER — Encounter (HOSPITAL_COMMUNITY): Payer: Self-pay

## 2014-06-04 LAB — BASIC METABOLIC PANEL
ANION GAP: 5 (ref 5–15)
BUN: 26 mg/dL — ABNORMAL HIGH (ref 6–23)
CHLORIDE: 107 mmol/L (ref 96–112)
CO2: 26 mmol/L (ref 19–32)
Calcium: 7.9 mg/dL — ABNORMAL LOW (ref 8.4–10.5)
Creatinine, Ser: 1.56 mg/dL — ABNORMAL HIGH (ref 0.50–1.10)
GFR, EST AFRICAN AMERICAN: 34 mL/min — AB (ref >90)
GFR, EST NON AFRICAN AMERICAN: 29 mL/min — AB (ref >90)
Glucose, Bld: 144 mg/dL — ABNORMAL HIGH (ref 70–99)
POTASSIUM: 4.5 mmol/L (ref 3.5–5.1)
SODIUM: 138 mmol/L (ref 135–145)

## 2014-06-04 LAB — GLUCOSE, CAPILLARY
GLUCOSE-CAPILLARY: 140 mg/dL — AB (ref 70–99)
GLUCOSE-CAPILLARY: 154 mg/dL — AB (ref 70–99)
GLUCOSE-CAPILLARY: 181 mg/dL — AB (ref 70–99)
Glucose-Capillary: 146 mg/dL — ABNORMAL HIGH (ref 70–99)

## 2014-06-04 LAB — CBC
HEMATOCRIT: 32.7 % — AB (ref 36.0–46.0)
Hemoglobin: 10.7 g/dL — ABNORMAL LOW (ref 12.0–15.0)
MCH: 29.7 pg (ref 26.0–34.0)
MCHC: 32.7 g/dL (ref 30.0–36.0)
MCV: 90.8 fL (ref 78.0–100.0)
Platelets: UNDETERMINED 10*3/uL (ref 150–400)
RBC: 3.6 MIL/uL — ABNORMAL LOW (ref 3.87–5.11)
RDW: 15 % (ref 11.5–15.5)
WBC: 6 10*3/uL (ref 4.0–10.5)

## 2014-06-04 MED ORDER — DEXTROSE 5 % IV SOLN
1.0000 g | INTRAVENOUS | Status: DC
  Administered 2014-06-04 – 2014-06-06 (×3): 1 g via INTRAVENOUS
  Filled 2014-06-04 (×4): qty 10

## 2014-06-04 MED ORDER — INSULIN ASPART 100 UNIT/ML ~~LOC~~ SOLN
0.0000 [IU] | Freq: Three times a day (TID) | SUBCUTANEOUS | Status: DC
Start: 2014-06-04 — End: 2014-06-09
  Administered 2014-06-04 – 2014-06-05 (×2): 2 [IU] via SUBCUTANEOUS
  Administered 2014-06-05 (×2): 1 [IU] via SUBCUTANEOUS
  Administered 2014-06-06: 2 [IU] via SUBCUTANEOUS
  Administered 2014-06-06: 1 [IU] via SUBCUTANEOUS
  Administered 2014-06-06 – 2014-06-07 (×2): 2 [IU] via SUBCUTANEOUS
  Administered 2014-06-07: 3 [IU] via SUBCUTANEOUS
  Administered 2014-06-08: 100 [IU] via SUBCUTANEOUS
  Administered 2014-06-08: 1 [IU] via SUBCUTANEOUS
  Administered 2014-06-08: 100 [IU] via SUBCUTANEOUS
  Administered 2014-06-09: 2 [IU] via SUBCUTANEOUS

## 2014-06-04 MED ORDER — SODIUM CHLORIDE 0.9 % IV SOLN
INTRAVENOUS | Status: AC
  Administered 2014-06-04: 13:00:00 via INTRAVENOUS

## 2014-06-04 MED ORDER — IOHEXOL 300 MG/ML  SOLN
25.0000 mL | INTRAMUSCULAR | Status: AC
Start: 2014-06-04 — End: 2014-06-04
  Administered 2014-06-04 (×2): 25 mL via ORAL

## 2014-06-04 NOTE — Progress Notes (Signed)
Inpatient Diabetes Program Recommendations  AACE/ADA: New Consensus Statement on Inpatient Glycemic Control (2013)  Target Ranges:  Prepandial:   less than 140 mg/dL      Peak postprandial:   less than 180 mg/dL (1-2 hours)      Critically ill patients:  140 - 180 mg/dL   Results for [name], [name] (MRN [id number]) as of 06/04/2014 13:15  Ref. Range 06/04/2014 00:27 06/04/2014 06:46  Glucose-Capillary Latest Range: 70-99 mg/dL 409154 (H) 811146 (H)    Diabetes history: DM2 Outpatient Diabetes medications: None Current orders for Inpatient glycemic control: None  Inpatient Diabetes Program Recommendations Correction (SSI): Please consider ordering CBGs with Novolog correction ACHS. HgbA1C: A1C has been ordered for tomorrow morning. Last A1C in the chart was 7.5% on 01/17/14.  Thanks, Orlando PennerMarie Kearstyn Avitia, RN, MSN, CCRN, CDE Diabetes Coordinator Inpatient Diabetes Program [phone] (Team Pager) [phone] (AP office) [phone] [facility] office)

## 2014-06-04 NOTE — Progress Notes (Signed)
Patient ID: Ashley RheinDoris E Simone, female   DOB: 09/08/1927, 81 y.o.   MRN: [id number]  [facility] PROGRESS NOTE  [name] JXB:[id number] DOB: 09/08/1927 DOA: 06/03/2014 PCP: Sharon SellerEUBANKS, JESSICA K, NP   Brief narrative:    81 y.o. female with HTN, HLD, ulcer of the LLE and has hx of stents to LLE for PVD, [facility] with main concern of several days duration of progressively worsening right groin pain radiating to lower abd area. This has been associated with subjective fevers, chills, malaise, difficulty with ambulation due to pain. Pain is sharp and intermittent, 7/10 in severity when present with no specific alleviating factors, aggravated with movement, no similar events in the past.   In ED, VS notable for T 102 F, HR 96 bpm, blood work notable for Cr 1.61. UA with few bacteria and small leukocytes. TRH asked to admit for further evaluation. Pt started on Rocephin in ED.   Assessment/Plan:    Principal Problem:   SIRS secondary to UTI  - no signs of pyelonephritis on CT abd (but was done without contrast)  - follow up on urine cultures - continue Rocephin day #2 Active Problems:   Chronic kidney disease, stage III (moderate) - baseline Cr 1.3 - 1.7 in the recent past - IVF have been provided and Cr is trending down: 1.61 --> 1.56 - will repeat BMP in AM   HTN - continue Coreg and Clonidine per home medical regimen   HLD - continue statin    Anemia of chronic disease, CKD - drop in Hg since admission, likely dilutional from IVF pt has received - no signs of active bleeding, will check anemia panel with next blood work - repeat CBC in AM   Hyperglycemia - check A1C   Left adnexal cyst - noted on CT abd/pelvis (please note done without contrast)  - will need close outpatient follow up  DVT prophylaxis - Heparin SQ   Code Status: Full.  Family Communication:  plan of care discussed with the patient Disposition Plan: Home when stable.   IV access:  Peripheral  IV  Procedures and diagnostic studies:    Ct Abdomen Pelvis Wo Contrast  06/04/2014   No acute findings are evident in the abdomen or pelvis.  Colonic diverticulosis  3 cm left adnexal cyst.  Consider sonography for characterization.     Dg Chest 2 View  06/03/2014  Cardiomegaly without decompensation.  Chronic changes.    Medical Consultants:  None  Other Consultants:  None  IAnti-Infectives:   Rocephin 3/15 -->  Debbora PrestoMAGICK-Burnham Trost, MD  [facility] Pager [phone]  If 7PM-7AM, please contact night-coverage [email or web address] Password [email or web address] 06/04/2014, 12:26 PM   LOS: 1 day   HPI/Subjective: No events overnight.   Objective: Filed Vitals:   06/03/14 2030 06/03/14 2252 06/04/14 0020 06/04/14 0511  BP: 153/62 177/61 189/52 150/40  Pulse: 70 96 89 61  Temp:   100.8 F (38.2 C) 98.8 F (37.1 C)  TempSrc:   Oral Axillary  Resp: 13 17 16 17   Weight:      SpO2: 92% 91% 100% 98%    Intake/Output Summary (Last 24 hours) at 06/04/14 1226 Last data filed at 06/04/14 1143  Gross per 24 hour  Intake    600 ml  Output    265 ml  Net    335 ml    Exam:   General:  Pt is alert, follows commands appropriately, not in acute distress  Cardiovascular: Regular rate and rhythm,  no rubs, no gallops  Respiratory: Clear to auscultation bilaterally, no wheezing, no crackles, no rhonchi  Abdomen: Soft, tender in lower abd quadrants, non distended, bowel sounds present, no guarding  Extremities:  pulses DP and PT palpable bilaterally  Neuro: Grossly nonfocal  Data Reviewed: Basic Metabolic Panel:  Recent Labs Lab 06/03/14 1838 06/04/14 0720  NA 138 138  K 3.5 4.5  CL 107 107  CO2 27 26  GLUCOSE 182* 144*  BUN 25* 26*  CREATININE 1.61* 1.56*  CALCIUM 8.4 7.9*   Liver Function Tests:  Recent Labs Lab 06/03/14 1838  AST 18  ALT 10  ALKPHOS 67  BILITOT 0.4  PROT 5.5*  ALBUMIN 2.8*   CBC:  Recent Labs Lab 06/03/14 1838 06/04/14 0720  WBC 6.8 6.0  NEUTROABS 5.4  --    HGB 11.5* 10.7*  HCT 35.7* 32.7*  MCV 91.3 90.8  PLT 145* PLATELET CLUMPS NOTED ON SMEAR, UNABLE TO ESTIMATE   CBG:  Recent Labs Lab 06/04/14 0027 06/04/14 0646  GLUCAP 154* 146*    Scheduled Meds: . aspirin  81 mg Oral Daily  . carvedilol  3.125 mg Oral BID WC  . cefTRIAXone  IV  1 g Intravenous Q24H  . cloNIDine  0.1 mg Oral QHS  . colesevelam  625 mg Oral BID WC  . ezetimibe-simvastatin  0.5 tablet Oral q1800  . heparin  5,000 Units Subcutaneous 3 times per day  . timolol  1 drop Left Eye Daily   Continuous Infusions:

## 2014-06-05 ENCOUNTER — Ambulatory Visit: Payer: Medicare Other | Admitting: Nurse Practitioner

## 2014-06-05 LAB — IRON AND TIBC
Iron: 18 ug/dL — ABNORMAL LOW (ref 42–145)
SATURATION RATIOS: 10 % — AB (ref 20–55)
TIBC: 174 ug/dL — ABNORMAL LOW (ref 250–470)
UIBC: 156 ug/dL (ref 125–400)

## 2014-06-05 LAB — GLUCOSE, CAPILLARY
GLUCOSE-CAPILLARY: 122 mg/dL — AB (ref 70–99)
GLUCOSE-CAPILLARY: 185 mg/dL — AB (ref 70–99)
GLUCOSE-CAPILLARY: 147 mg/dL — AB (ref 70–99)
Glucose-Capillary: 153 mg/dL — ABNORMAL HIGH (ref 70–99)

## 2014-06-05 LAB — BASIC METABOLIC PANEL
Anion gap: 8 (ref 5–15)
BUN: 29 mg/dL — ABNORMAL HIGH (ref 6–23)
CO2: 25 mmol/L (ref 19–32)
Calcium: 8.3 mg/dL — ABNORMAL LOW (ref 8.4–10.5)
Chloride: 108 mmol/L (ref 96–112)
Creatinine, Ser: 1.53 mg/dL — ABNORMAL HIGH (ref 0.50–1.10)
GFR, EST AFRICAN AMERICAN: 35 mL/min — AB (ref >90)
GFR, EST NON AFRICAN AMERICAN: 30 mL/min — AB (ref >90)
GLUCOSE: 132 mg/dL — AB (ref 70–99)
Potassium: 4.3 mmol/L (ref 3.5–5.1)
Sodium: 141 mmol/L (ref 135–145)

## 2014-06-05 LAB — RETICULOCYTES
RBC.: 3.57 MIL/uL — ABNORMAL LOW (ref 3.87–5.11)
RETIC COUNT ABSOLUTE: 50 10*3/uL (ref 19.0–186.0)
Retic Ct Pct: 1.4 % (ref 0.4–3.1)

## 2014-06-05 LAB — FOLATE: FOLATE: 17.4 ng/mL

## 2014-06-05 LAB — CBC
HEMATOCRIT: 32.2 % — AB (ref 36.0–46.0)
Hemoglobin: 10.4 g/dL — ABNORMAL LOW (ref 12.0–15.0)
MCH: 29.1 pg (ref 26.0–34.0)
MCHC: 32.3 g/dL (ref 30.0–36.0)
MCV: 90.2 fL (ref 78.0–100.0)
Platelets: 133 10*3/uL — ABNORMAL LOW (ref 150–400)
RBC: 3.57 MIL/uL — AB (ref 3.87–5.11)
RDW: 15.1 % (ref 11.5–15.5)
WBC: 4.2 10*3/uL (ref 4.0–10.5)

## 2014-06-05 LAB — VITAMIN B12: Vitamin B-12: 268 pg/mL (ref 211–911)

## 2014-06-05 LAB — URINE CULTURE

## 2014-06-05 LAB — FERRITIN: Ferritin: 158 ng/mL (ref 10–291)

## 2014-06-05 MED ORDER — HYDRALAZINE HCL 20 MG/ML IJ SOLN
10.0000 mg | Freq: Once | INTRAMUSCULAR | Status: AC
  Administered 2014-06-05: 10 mg via INTRAVENOUS
  Filled 2014-06-05: qty 1

## 2014-06-05 NOTE — Evaluation (Signed)
Physical Therapy Evaluation Patient Details Name: Ashley RheinDoris E Walters MRN: [id number] DOB: [birth year] Today's Date: 06/05/2014   History of Present Illness  Pt is a 81 y.o. female adm due to UTI and c/o Rt groin pain   Clinical Impression  Pt adm due to above. Pt presents with functional mobility limitations due to deficits indicated below (see PT problem list below). Pt to benefit from skilled acute PT to maximize functional mobility prior to D/C home. Patient needs to practice stairs next session prior to D/C. Pt refusing to ambulate with RW at this time or ever, per daughter.     Follow Up Recommendations Home health PT;Supervision/Assistance - 24 hour    Equipment Recommendations  None recommended by PT    Recommendations for Other Services       Precautions / Restrictions Precautions Precautions: Fall Precaution Comments: pt denies any falls  Restrictions Weight Bearing Restrictions: No      Mobility  Bed Mobility               General bed mobility comments: pt sitting up on EOB  Transfers Overall transfer level: Modified independent Equipment used: None             General transfer comment: no noted sway or LOB  Ambulation/Gait Ambulation/Gait assistance: Min guard Ambulation Distance (Feet): 18 Feet Assistive device: None Gait Pattern/deviations: Step-through pattern;Decreased stride length;Wide base of support;Shuffle Gait velocity: decr Gait velocity interpretation: Below normal speed for age/gender General Gait Details: pt with "waddle" like gt; no LOB noted but min guard with directional changes to steady; pt refusing RW at this time; O2 after ambulation 92%  Stairs            Wheelchair Mobility    Modified Rankin (Stroke Patients Only)       Balance Overall balance assessment: Needs assistance Sitting-balance support: Feet supported;No upper extremity supported Sitting balance-Leahy Scale: Good Sitting balance - Comments: pt donning  socks   Standing balance support: During functional activity;No upper extremity supported Standing balance-Leahy Scale: Fair                               Pertinent Vitals/Pain Pain Assessment: No/denies pain    Home Living Family/patient expects to be discharged to:: Private residence Living Arrangements: Children Available Help at Discharge: Family;Available 24 hours/day Type of Home: House Home Access: Stairs to enter Entrance Stairs-Rails: Right;Left;Can reach both Entrance Stairs-Number of Steps: 5 Home Layout: One level Home Equipment: Walker - 2 wheels      Prior Function Level of Independence: Independent         Comments: pt very independent; refusing to ambulate with RW in past     Hand Dominance        Extremity/Trunk Assessment   Upper Extremity Assessment: Defer to OT evaluation           Lower Extremity Assessment: Generalized weakness;RLE deficits/detail RLE Deficits / Details: noted Rt ankle wound     Cervical / Trunk Assessment: Normal  Communication   Communication: HOH  Cognition Arousal/Alertness: Awake/alert Behavior During Therapy: WFL for tasks assessed/performed Overall Cognitive Status: Within Functional Limits for tasks assessed                      General Comments General comments (skin integrity, edema, etc.): encouraged pt and daughter to call nursing for OOB activity at this time     Exercises  Assessment/Plan    PT Assessment Patient needs continued PT services  PT Diagnosis Generalized weakness;Difficulty walking   PT Problem List Decreased strength;Decreased activity tolerance;Decreased balance;Decreased mobility;Decreased knowledge of use of DME;Impaired sensation  PT Treatment Interventions DME instruction;Gait training;Stair training;Functional mobility training;Therapeutic activities;Balance training;Therapeutic exercise;Neuromuscular re-education;Patient/family education   PT Goals  (Current goals can be found in the Care Plan section) Acute Rehab PT Goals Patient Stated Goal: to keep doing what ive been doing PT Goal Formulation: With patient Time For Goal Achievement: 06/08/14 Potential to Achieve Goals: Good    Frequency Min 3X/week   Barriers to discharge        Co-evaluation               End of Session Equipment Utilized During Treatment: Gait belt Activity Tolerance: Patient tolerated treatment well Patient left: in bed;with call bell/phone within reach;with family/visitor present Nurse Communication: Mobility status         Time: 9562-1308 PT Time Calculation (min) (ACUTE ONLY): 17 min   Charges:   PT Evaluation $Initial PT Evaluation Tier I: 1 Procedure     PT G CodesDonell Sievert, [place]  [phone] 06/05/2014, 12:06 PM

## 2014-06-05 NOTE — Progress Notes (Signed)
Pts BP this a.m. 213/44. Notified [facility] hospitalist on call. Received orders to give 10 mg hydralazine. Orders carried out. Pts BP now 155/40. Nursing will continue to monitor.

## 2014-06-05 NOTE — Progress Notes (Signed)
Assessed patient's BP and Apical pulse prior to giving Coreg and Welchol at 0820. BP: 123/37 and Apical pulse: 72. Foley bag in proper place and contained light yellow urine. Found left and right radial pulses to be equal. Heart and lung sounds good. Patient was A&O to name and DOB. Patient up in bed, eating breakfast.

## 2014-06-05 NOTE — Progress Notes (Signed)
Patient ID: [name] Heslop, female   DOB: 02/06/1928, 81 y.o.   MRN: [id number]  [facility] PROGRESS NOTE  [name] Lapinski [id number] DOB: 02/06/1928 DOA: 06/03/2014 PCP: Sharon SellerEUBANKS, JESSICA K, NP   Brief narrative:    81 y.o. female with HTN, HLD, ulcer of the LLE and has hx of stents to LLE for PVD, [facility] with main concern of several days duration of progressively worsening right groin pain radiating to lower abd area. This has been associated with subjective fevers, chills, malaise, difficulty with ambulation due to pain. Pain is sharp and intermittent, 7/10 in severity when present with no specific alleviating factors, aggravated with movement, no similar events in the past.   In ED, VS notable for T 102 F, HR 96 bpm, blood work notable for Cr 1.61. UA with few bacteria and small leukocytes. TRH asked to admit for further evaluation. Pt started on Rocephin in ED.   Assessment/Plan:    Principal Problem:   SIRS secondary to UTI  - no signs of pyelonephritis on CT abd (but was done without contrast)  - urine culture with multiple morphotypes and recollection recommended - since pt has already been started on ABX, no need to recollect urine culture as it will likely be negative - continue Rocephin day #3/7 Active Problems:   Chronic kidney disease, stage III (moderate) - baseline Cr 1.3 - 1.7 in the recent past - IVF have been provided and Cr is trending down: 1.61 --> 1.56 --> 1.53 - will repeat BMP in AM   HTN - continue Coreg and Clonidine per home medical regimen   HLD - continue statin    Anemia of chronic disease, CKD - drop in Hg since admission, likely dilutional from IVF pt has received - no signs of active bleeding - anemia panel pending  - repeat CBC in AM   Hyperglycemia - A1C pending   Left adnexal cyst - noted on CT abd/pelvis (please note done without contrast)  - will need close outpatient follow up  DVT prophylaxis - Heparin SQ   Code  Status: Full.  Family Communication:  plan of care discussed with the patient Disposition Plan: Home in next 24 - 48 hours, HH PT recommended, orders placed   IV access:  Peripheral IV  Procedures and diagnostic studies:    Ct Abdomen Pelvis Wo Contrast  06/04/2014   No acute findings are evident in the abdomen or pelvis.  Colonic diverticulosis  3 cm left adnexal cyst.  Consider sonography for characterization.     Dg Chest 2 View  06/03/2014  Cardiomegaly without decompensation.  Chronic changes.    Medical Consultants:  None  Other Consultants:  None  IAnti-Infectives:   Rocephin 3/15 -->  Debbora PrestoMAGICK-MYERS, ISKRA, MD  [facility] Pager [phone]  If 7PM-7AM, please contact night-coverage [email or web address] Password [email or web address] 06/05/2014, 2:44 PM   LOS: 2 days   HPI/Subjective: No events overnight.   Objective: Filed Vitals:   06/05/14 0545 06/05/14 0818 06/05/14 0819 06/05/14 1343  BP: 155/40 123/70  144/45  Pulse:  70 72 58  Temp:    99.1 F (37.3 C)  TempSrc:    Oral  Resp:    17  Weight:      SpO2:    92%    Intake/Output Summary (Last 24 hours) at 06/05/14 1444 Last data filed at 06/05/14 0830  Gross per 24 hour  Intake    240 ml  Output    850 ml  Net   -  610 ml    Exam:   General:  Pt is alert, follows commands appropriately, not in acute distress  Cardiovascular: Regular rate and rhythm, no rubs, no gallops  Respiratory: Clear to auscultation bilaterally, no wheezing, no crackles, no rhonchi  Abdomen: Soft, non tender, non distended, bowel sounds present, no guarding  Extremities:  pulses DP and PT palpable bilaterally  Neuro: Grossly nonfocal  Data Reviewed: Basic Metabolic Panel:  Recent Labs Lab 06/03/14 1838 06/04/14 0720 06/05/14 0724  NA 138 138 141  K 3.5 4.5 4.3  CL 107 107 108  CO2 GLUCOSE 182* 144* 132*  BUN 25* 26* 29*  CREATININE 1.61* 1.56* 1.53*  CALCIUM 8.4 7.9* 8.3*   Liver Function Tests:  Recent Labs Lab  06/03/14 1838  AST 18  ALT 10  ALKPHOS 67  BILITOT 0.4  PROT 5.5*  ALBUMIN 2.8*   CBC:  Recent Labs Lab 06/03/14 1838 06/04/14 0720 06/05/14 0724  WBC 6.8 6.0 4.2  NEUTROABS 5.4  --   --   HGB 11.5* 10.7* 10.4*  HCT 35.7* 32.7* 32.2*  MCV 91.3 90.8 90.2  PLT 145* PLATELET CLUMPS NOTED ON SMEAR, UNABLE TO ESTIMATE 133*   CBG:  Recent Labs Lab 06/04/14 0646 06/04/14 1559 06/04/14 2134 06/05/14 0646 06/05/14 1108  GLUCAP 146* 181* 140* 122* 185*    Scheduled Meds: . aspirin  81 mg Oral Daily  . carvedilol  3.125 mg Oral BID WC  . cefTRIAXone  IV  1 g Intravenous Q24H  . cloNIDine  0.1 mg Oral QHS  . colesevelam  625 mg Oral BID WC  . ezetimibe-simvastatin  0.5 tablet Oral q1800  . heparin  5,000 Units Subcutaneous 3 times per day  . timolol  1 drop Left Eye Daily   Continuous Infusions:

## 2014-06-05 NOTE — Progress Notes (Signed)
Utilization review completed. Marilee Ditommaso, RN, BSN. 

## 2014-06-06 LAB — CBC
HCT: 29.8 % — ABNORMAL LOW (ref 36.0–46.0)
HEMOGLOBIN: 9.7 g/dL — AB (ref 12.0–15.0)
MCH: 29.6 pg (ref 26.0–34.0)
MCHC: 32.6 g/dL (ref 30.0–36.0)
MCV: 90.9 fL (ref 78.0–100.0)
Platelets: 144 10*3/uL — ABNORMAL LOW (ref 150–400)
RBC: 3.28 MIL/uL — AB (ref 3.87–5.11)
RDW: 15.3 % (ref 11.5–15.5)
WBC: 3.7 10*3/uL — ABNORMAL LOW (ref 4.0–10.5)

## 2014-06-06 LAB — HEMOGLOBIN A1C
Hgb A1c MFr Bld: 8 % — ABNORMAL HIGH (ref 4.8–5.6)
MEAN PLASMA GLUCOSE: 183 mg/dL

## 2014-06-06 LAB — BASIC METABOLIC PANEL
Anion gap: 6 (ref 5–15)
BUN: 25 mg/dL — ABNORMAL HIGH (ref 6–23)
CHLORIDE: 111 mmol/L (ref 96–112)
CO2: 26 mmol/L (ref 19–32)
Calcium: 8.2 mg/dL — ABNORMAL LOW (ref 8.4–10.5)
Creatinine, Ser: 1.41 mg/dL — ABNORMAL HIGH (ref 0.50–1.10)
GFR calc non Af Amer: 33 mL/min — ABNORMAL LOW (ref >90)
GFR, EST AFRICAN AMERICAN: 38 mL/min — AB (ref >90)
GLUCOSE: 142 mg/dL — AB (ref 70–99)
POTASSIUM: 4.2 mmol/L (ref 3.5–5.1)
SODIUM: 143 mmol/L (ref 135–145)

## 2014-06-06 LAB — GLUCOSE, CAPILLARY
GLUCOSE-CAPILLARY: 129 mg/dL — AB (ref 70–99)
GLUCOSE-CAPILLARY: 150 mg/dL — AB (ref 70–99)
Glucose-Capillary: 181 mg/dL — ABNORMAL HIGH (ref 70–99)

## 2014-06-06 MED ORDER — FERROUS SULFATE 325 (65 FE) MG PO TABS
325.0000 mg | ORAL_TABLET | Freq: Two times a day (BID) | ORAL | Status: DC
  Administered 2014-06-06 – 2014-06-08 (×5): 325 mg via ORAL
  Filled 2014-06-06 (×6): qty 1

## 2014-06-06 MED ORDER — LINAGLIPTIN 5 MG PO TABS
5.0000 mg | ORAL_TABLET | Freq: Every day | ORAL | Status: DC
  Administered 2014-06-07 – 2014-06-09 (×3): 5 mg via ORAL
  Filled 2014-06-06 (×3): qty 1

## 2014-06-06 NOTE — Progress Notes (Signed)
Physical Therapy Treatment Patient Details Name: [name] MRN: [id number] DOB: October 10, 1927 Today's Date: 06/06/2014    History of Present Illness Pt is a 81 y.o. female adm due to UTI and c/o Rt groin pain     PT Comments    Pt at supervision level for mobility for safety. Cont to recommend HHPT to address functional mobility at home. Will cont to follow per POC.   Follow Up Recommendations  Home health PT;Supervision/Assistance - 24 hour     Equipment Recommendations  None recommended by PT    Recommendations for Other Services       Precautions / Restrictions Precautions Precautions: None Restrictions Weight Bearing Restrictions: No    Mobility  Bed Mobility Overal bed mobility: Modified Independent             General bed mobility comments: use of handrail  Transfers Overall transfer level: Modified independent Equipment used: None             General transfer comment: no noted sway or LOB  Ambulation/Gait Ambulation/Gait assistance: Supervision Ambulation Distance (Feet): 130 Feet Assistive device: None Gait Pattern/deviations: Step-through pattern;Decreased stride length;Wide base of support Gait velocity: decr Gait velocity interpretation: Below normal speed for age/gender General Gait Details: no LOB noted; pt with widened BOS and externally rotated LEs   Stairs Stairs:  (pt refusing; "i know how to do steps" )          Wheelchair Mobility    Modified Rankin (Stroke Patients Only)       Balance Overall balance assessment: No apparent balance deficits (not formally assessed)                                  Cognition Arousal/Alertness: Awake/alert Behavior During Therapy: WFL for tasks assessed/performed Overall Cognitive Status: Within Functional Limits for tasks assessed                      Exercises      General Comments        Pertinent Vitals/Pain Pain Assessment: No/denies pain     Home Living                      Prior Function            PT Goals (current goals can now be found in the care plan section) Acute Rehab PT Goals Patient Stated Goal: do what i do  PT Goal Formulation: With patient Time For Goal Achievement: 06/08/14 Potential to Achieve Goals: Good Progress towards PT goals: Progressing toward goals    Frequency  Min 3X/week    PT Plan Current plan remains appropriate    Co-evaluation             End of Session Equipment Utilized During Treatment: Gait belt Activity Tolerance: Patient tolerated treatment well Patient left: in bed;with bed alarm set;with call bell/phone within reach;with family/visitor present     Time: [id number] PT Time Calculation (min) (ACUTE ONLY): 14 min  Charges:  $Gait Training: 8-22 mins                    G CodesDonell Walters:      [name], [place]  [phone] 06/06/2014, 12:59 PM

## 2014-06-06 NOTE — Progress Notes (Signed)
Patient ID: [name], female   DOB: [birth year], 81 y.o.   MRN: [id number]  [facility] PROGRESS NOTE  KATYA ROLSTON NWG:[id number] DOB: Mar 11, 1928 DOA: 06/03/2014 PCP: Sharon Seller, NP   Brief narrative:    81 y.o. female with HTN, HLD, ulcer of the LLE and has hx of stents to LLE for PVD, presented to [facility] ED with main concern of several days duration of progressively worsening right groin pain radiating to lower abd area. This has been associated with subjective fevers, chills, malaise, difficulty with ambulation due to pain. Pain is sharp and intermittent, 7/10 in severity when present with no specific alleviating factors, aggravated with movement, no similar events in the past.   In ED, VS notable for T 102 F, HR 96 bpm, blood work notable for Cr 1.61. UA with few bacteria and small leukocytes. TRH asked to admit for further evaluation. Pt started on Rocephin in ED.   Assessment/Plan:    Principal Problem:   SIRS secondary to UTI  - no signs of pyelonephritis on CT abd (but was done without contrast)  - urine culture with multiple morphotypes and recollection recommended - since pt has already been started on ABX, no need to recollect urine culture as it will likely be negative - continue Rocephin day #4/7 Active Problems:   Chronic kidney disease, stage III (moderate) - baseline Cr 1.3 - 1.7 in the recent past - IVF have been provided and Cr is trending down: 1.61 --> 1.56 --> 1.53 --> 1.41 - will repeat BMP in AM   HTN - continue Coreg and Clonidine per home medical regimen   HLD - continue statin    Anemia of chronic disease, CKD, IDA - anemia panel with low iron - no signs of active bleeding - place on iron supplementation  - repeat CBC in AM   Hyperglycemia - A1C 8.5 - pt does not want insulin - will place on Januvia - avoid metformin due to renal failure    Left adnexal cyst - noted on CT abd/pelvis (please note done without contrast)  - will need  close outpatient follow up  DVT prophylaxis - Heparin SQ   Code Status: Full.  Family Communication:  plan of care discussed with the patient and daughter over the phone  Disposition Plan: Home in next 24 - 48 hours, HH PT recommended, orders placed   IV access:  Peripheral IV  Procedures and diagnostic studies:    Ct Abdomen Pelvis Wo Contrast  06/04/2014   No acute findings are evident in the abdomen or pelvis.  Colonic diverticulosis  3 cm left adnexal cyst.  Consider sonography for characterization.     Dg Chest 2 View  06/03/2014  Cardiomegaly without decompensation.  Chronic changes.    Medical Consultants:  None  Other Consultants:  None  IAnti-Infectives:   Rocephin 3/15 -->  Debbora Presto, MD  [facility] Pager [phone]  If 7PM-7AM, please contact night-coverage [email or web address] Password [facility] 06/06/2014, 4:58 PM   LOS: 3 days   HPI/Subjective: No events overnight.   Objective: Filed Vitals:   06/05/14 1802 06/05/14 2003 06/06/14 0600 06/06/14 1501  BP: 136/36 159/44 118/57 139/50  Pulse: 63 63 91 64  Temp:  98.9 F (37.2 C) 98.5 F (36.9 C) 98.4 F (36.9 C)  TempSrc:  Oral Oral Oral  Resp:  Weight:      SpO2:  96% 95% 95%    Intake/Output Summary (Last 24 hours) at 06/06/14  1658 Last data filed at 06/06/14 0604  Gross per 24 hour  Intake    240 ml  Output   1625 ml  Net  -1385 ml    Exam:   General:  Pt is alert, follows commands appropriately, not in acute distress  Cardiovascular: Regular rate and rhythm, no rubs, no gallops  Respiratory: Clear to auscultation bilaterally, no wheezing, no crackles, no rhonchi  Abdomen: Soft, non tender, non distended, bowel sounds present, no guarding  Extremities:  pulses DP and PT palpable bilaterally  Neuro: Grossly nonfocal  Data Reviewed: Basic Metabolic Panel:  Recent Labs Lab 06/03/14 1838 06/04/14 0720 06/05/14 0724 06/06/14 0502  NA 138 138 141 143  K 3.5 4.5 4.3 4.2  CL 107  107 108 111  CO2 27 26 25 26   GLUCOSE 182* 144* 132* 142*  BUN 25* 26* 29* 25*  CREATININE 1.61* 1.56* 1.53* 1.41*  CALCIUM 8.4 7.9* 8.3* 8.2*   Liver Function Tests:  Recent Labs Lab 06/03/14 1838  AST 18  ALT 10  ALKPHOS 67  BILITOT 0.4  PROT 5.5*  ALBUMIN 2.8*   CBC:  Recent Labs Lab 06/03/14 1838 06/04/14 0720 06/05/14 0724 06/06/14 0502  WBC 6.8 6.0 4.2 3.7*  NEUTROABS 5.4  --   --   --   HGB 11.5* 10.7* 10.4* 9.7*  HCT 35.7* 32.7* 32.2* 29.8*  MCV 91.3 90.8 90.2 90.9  PLT 145* PLATELET CLUMPS NOTED ON SMEAR, UNABLE TO ESTIMATE 133* 144*   CBG:  Recent Labs Lab 06/05/14 1629 06/05/14 2133 06/06/14 0622 06/06/14 1112 06/06/14 1611  GLUCAP 147* 153* 129* 150* 181*    Scheduled Meds: . aspirin  81 mg Oral Daily  . carvedilol  3.125 mg Oral BID WC  . cefTRIAXone  IV  1 g Intravenous Q24H  . cloNIDine  0.1 mg Oral QHS  . colesevelam  625 mg Oral BID WC  . ezetimibe-simvastatin  0.5 tablet Oral q1800  . heparin  5,000 Units Subcutaneous 3 times per day  . timolol  1 drop Left Eye Daily   Continuous Infusions:

## 2014-06-06 NOTE — Progress Notes (Signed)
Pt stated she got stool on self and insisted to wash up in bathroom with no assistance. NT assisted pt to bathroom and stood outside bathroom door while pt bathed.

## 2014-06-07 ENCOUNTER — Inpatient Hospital Stay (HOSPITAL_COMMUNITY): Payer: Medicare Other

## 2014-06-07 LAB — BASIC METABOLIC PANEL
Anion gap: 11 (ref 5–15)
BUN: 22 mg/dL (ref 6–23)
CHLORIDE: 108 mmol/L (ref 96–112)
CO2: 20 mmol/L (ref 19–32)
Calcium: 8.3 mg/dL — ABNORMAL LOW (ref 8.4–10.5)
Creatinine, Ser: 1.34 mg/dL — ABNORMAL HIGH (ref 0.50–1.10)
GFR calc non Af Amer: 35 mL/min — ABNORMAL LOW (ref >90)
GFR, EST AFRICAN AMERICAN: 41 mL/min — AB (ref >90)
Glucose, Bld: 147 mg/dL — ABNORMAL HIGH (ref 70–99)
Potassium: 4.2 mmol/L (ref 3.5–5.1)
Sodium: 139 mmol/L (ref 135–145)

## 2014-06-07 LAB — GLUCOSE, CAPILLARY
GLUCOSE-CAPILLARY: 106 mg/dL — AB (ref 70–99)
GLUCOSE-CAPILLARY: 111 mg/dL — AB (ref 70–99)
GLUCOSE-CAPILLARY: 162 mg/dL — AB (ref 70–99)
GLUCOSE-CAPILLARY: 229 mg/dL — AB (ref 70–99)

## 2014-06-07 LAB — CBC
HCT: 33.6 % — ABNORMAL LOW (ref 36.0–46.0)
Hemoglobin: 10.8 g/dL — ABNORMAL LOW (ref 12.0–15.0)
MCH: 29.2 pg (ref 26.0–34.0)
MCHC: 32.1 g/dL (ref 30.0–36.0)
MCV: 90.8 fL (ref 78.0–100.0)
PLATELETS: 161 10*3/uL (ref 150–400)
RBC: 3.7 MIL/uL — ABNORMAL LOW (ref 3.87–5.11)
RDW: 15 % (ref 11.5–15.5)
WBC: 5 10*3/uL (ref 4.0–10.5)

## 2014-06-07 MED ORDER — DEXTROSE 5 % IV SOLN
2.0000 g | Freq: Three times a day (TID) | INTRAVENOUS | Status: DC
  Filled 2014-06-07 (×2): qty 2

## 2014-06-07 MED ORDER — LEVOFLOXACIN 500 MG PO TABS: 500.0000 mg | ORAL_TABLET | Freq: Every day | ORAL | Status: DC

## 2014-06-07 MED ORDER — DEXTROSE 5 % IV SOLN
1.0000 g | Freq: Three times a day (TID) | INTRAVENOUS | Status: DC
  Administered 2014-06-07 – 2014-06-09 (×6): 1 g via INTRAVENOUS
  Filled 2014-06-07 (×8): qty 1

## 2014-06-07 MED ORDER — FUROSEMIDE 10 MG/ML IJ SOLN
20.0000 mg | Freq: Every day | INTRAMUSCULAR | Status: DC
  Administered 2014-06-07 – 2014-06-09 (×3): 20 mg via INTRAVENOUS
  Filled 2014-06-07 (×3): qty 2

## 2014-06-07 MED ORDER — HYDRALAZINE HCL 20 MG/ML IJ SOLN
20.0000 mg | Freq: Four times a day (QID) | INTRAMUSCULAR | Status: DC | PRN
  Filled 2014-06-07: qty 1

## 2014-06-07 MED ORDER — LINAGLIPTIN 5 MG PO TABS: 5.0000 mg | ORAL_TABLET | Freq: Every day | ORAL | Status: DC

## 2014-06-07 MED ORDER — FERROUS SULFATE 325 (65 FE) MG PO TABS: 325.0000 mg | ORAL_TABLET | Freq: Two times a day (BID) | ORAL | Status: DC

## 2014-06-07 MED ORDER — HYDRALAZINE HCL 20 MG/ML IJ SOLN
10.0000 mg | Freq: Once | INTRAMUSCULAR | Status: AC
  Administered 2014-06-07: 10 mg via INTRAVENOUS

## 2014-06-07 MED ORDER — HYDRALAZINE HCL 20 MG/ML IJ SOLN
10.0000 mg | Freq: Four times a day (QID) | INTRAMUSCULAR | Status: DC | PRN
  Administered 2014-06-07: 10 mg via INTRAVENOUS
  Filled 2014-06-07: qty 1

## 2014-06-07 MED ORDER — VANCOMYCIN HCL IN DEXTROSE 750-5 MG/150ML-% IV SOLN
750.0000 mg | INTRAVENOUS | Status: DC
  Administered 2014-06-07 – 2014-06-08 (×2): 750 mg via INTRAVENOUS
  Filled 2014-06-07 (×4): qty 150

## 2014-06-07 NOTE — Discharge Instructions (Signed)

## 2014-06-07 NOTE — Progress Notes (Signed)
Patient ID: [name], female   DOB: October 08, 1927, 81 y.o.   MRN: [id number]  [facility] PROGRESS NOTE  [name]:[id number] DOB: [birth year] DOA: 06/03/2014 PCP: Sharon Seller, NP   Brief narrative:    81 y.o. female with HTN, HLD, ulcer of the LLE and has hx of stents to LLE for PVD, presented to [facility] ED with main concern of several days duration of progressively worsening right groin pain radiating to lower abd area. This has been associated with subjective fevers, chills, malaise, difficulty with ambulation due to pain. Pain is sharp and intermittent, 7/10 in severity when present with no specific alleviating factors, aggravated with movement, no similar events in the past.   In ED, VS notable for T 102 F, HR 96 bpm, blood work notable for Cr 1.61. UA with few bacteria and small leukocytes. TRH asked to admit for further evaluation. Pt started on Rocephin in ED.   Major events since admission: 3/19 - low grade fever, CXR with developing PNA and pulmonary vascular congestion   Assessment/Plan:    Principal Problem:   New fever 3/19 with rhonchi on exam - CXR with developing PNA and possible pulmonary vascular congestion - will place on broad spectrum ABX to cover for HCAP as pt was already on Rocephin  - narrow down abx as clinically indicated  - PNA order set in place - sputum culture to be sent for analysis, urine legionella and strep pnemo also requested    ? Pulmonary vascular congestion - 2 D ECHO requested - placed on Lasix 20 mg IV QD and will monitor clinical response  - daily weights, strict I's and O's - weight today 145 lbs    SIRS secondary to UTI  - no signs of pyelonephritis on CT abd (but was done without contrast)  - urine culture with multiple morphotypes and recollection recommended - since pt has already been started on ABX, no need to recollect urine culture as it will likely be negative - continued Rocephin for 5 days, change of ABX as  noted above Active Problems:   Chronic kidney disease, stage III (moderate) - baseline Cr 1.3 - 1.7 in the recent past - IVF have been provided and Cr is trending down: 1.61 --> 1.56 --> 1.53 --> 1.41 --> 1.34 - will repeat BMP in AM   HTN - continue Coreg and Clonidine per home medical regimen   HLD - continue statin    Anemia of chronic disease, CKD, IDA - anemia panel with low iron - no signs of active bleeding - placed on iron supplementation  - repeat CBC in AM   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  Family Communication:  plan of care discussed with the patient and daughter over the phone  Disposition Plan: New PNA, so needs to remain hospitalized   IV access:  Peripheral IV  Procedures and diagnostic studies:    Ct Abdomen Pelvis Wo Contrast  06/04/2014   No acute findings are evident in the abdomen or pelvis.  Colonic diverticulosis  3 cm left adnexal cyst.  Consider sonography for characterization.     Dg Chest 2 View  06/03/2014  Cardiomegaly without decompensation.  Chronic changes.    Medical Consultants:  None  Other Consultants:  None  IAnti-Infectives:   Rocephin 3/15 --> 3/19 Vancomycin 3/19 --> Aztreonam 3/19 -->  Debbora PrestoMAGICK-Briselda Naval, MD  [facility] Pager [phone]  If 7PM-7AM, please contact night-coverage [email or web address] Password [email or web address] 06/07/2014, 10:33 AM   LOS: 4 days   HPI/Subjective: Low grade fever this AM.  Objective: Filed Vitals:   06/07/14 0346 06/07/14 0410 06/07/14 0553 06/07/14 0808  BP: 177/46 172/26 135/32 142/45  Pulse: 74 65 72 72  Temp: 98.1 F (36.7 C)  99.1 F (37.3 C)   TempSrc: Oral  Oral   Resp: 16 16 16    Weight:      SpO2: 99% 99% 98%      Intake/Output Summary (Last 24 hours) at 06/07/14 1033 Last data filed at 06/07/14 0556  Gross per 24 hour  Intake    840 ml  Output   2076 ml  Net  -1236 ml    Exam:   General:  Pt is alert, follows commands appropriately, not in acute distress  Cardiovascular: Regular rate and rhythm, no rubs, no gallops  Respiratory: Rhonchi and mild bibasilar crackles  Abdomen: Soft, non tender, non distended, bowel sounds present, no guarding  Extremities:  pulses DP and PT palpable bilaterally  Neuro: Grossly nonfocal  Data Reviewed: Basic Metabolic Panel:  Recent Labs Lab 06/03/14 1838 06/04/14 0720 06/05/14 0724 06/06/14 0502 06/07/14 0529  NA 138 138 141 143 139  K 3.5 4.5 4.3 4.2 4.2  CL 107 107 108 111 108  CO2 27 26 25 26 20   GLUCOSE 182* 144* 132* 142* 147*  BUN 25* 26* 29* 25* 22  CREATININE 1.61* 1.56* 1.53* 1.41* 1.34*  CALCIUM 8.4 7.9* 8.3* 8.2* 8.3*   Liver Function Tests:  Recent Labs Lab 06/03/14 1838  AST 18  ALT 10  ALKPHOS 67  BILITOT 0.4  PROT 5.5*  ALBUMIN 2.8*   CBC:  Recent Labs Lab 06/03/14 1838 06/04/14 0720 06/05/14 0724 06/06/14 0502 06/07/14 0529  WBC 6.8 6.0 4.2 3.7* 5.0  NEUTROABS 5.4  --   --   --   --   HGB 11.5* 10.7* 10.4* 9.7* 10.8*  HCT 35.7* 32.7* 32.2* 29.8* 33.6*  MCV 91.3 90.8 90.2 90.9 90.8  PLT 145* PLATELET CLUMPS NOTED ON SMEAR, UNABLE TO ESTIMATE 133* 144* 161   CBG:  Recent Labs Lab 06/06/14 0622 06/06/14 1112 06/06/14 1611 06/07/14 0016 06/07/14 0625  GLUCAP 129* 150* 181* 111* 162*    Scheduled Meds: . aspirin  81 mg Oral Daily  . carvedilol  3.125 mg Oral BID WC  . cefTRIAXone  IV  1 g Intravenous Q24H  . cloNIDine  0.1 mg Oral QHS  . colesevelam  625 mg Oral BID WC  . ezetimibe-simvastatin  0.5 tablet Oral q1800  . heparin  5,000 Units Subcutaneous 3 times per day  . timolol  1 drop Left Eye Daily   Continuous Infusions:

## 2014-06-07 NOTE — Progress Notes (Signed)
ANTIBIOTIC CONSULT NOTE - INITIAL  Pharmacy Consult for vancomycin Indication: pneumonia  Allergies  Allergen Reactions  . Percocet [Oxycodone-Acetaminophen] Other (See Comments)  . Sulfa Antibiotics Other (See Comments)    Tongue swells  . Penicillins Hives    Tolerates Ceftriaxone    Patient Measurements: Weight: 145 lb (65.772 kg)   Vital Signs: Temp: 99.1 F (37.3 C) (03/19 0553) Temp Source: Oral (03/19 0553) BP: 142/45 mmHg (03/19 0808) Pulse Rate: 72 (03/19 0808) Intake/Output from previous day: 03/18 0701 - 03/19 0700 In: 840 [P.O.:840] Out: 2076 [Urine:2075; Stool:1] Intake/Output from this shift:    Labs:  Recent Labs  06/05/14 0724 06/06/14 0502 06/07/14 0529  WBC 4.2 3.7* 5.0  HGB 10.4* 9.7* 10.8*  PLT 133* 144* 161  CREATININE 1.53* 1.41* 1.34*   Estimated Creatinine Clearance: 26.4 mL/min (by C-G formula based on Cr of 1.34). No results for input(s): VANCOTROUGH, VANCOPEAK, VANCORANDOM, GENTTROUGH, GENTPEAK, GENTRANDOM, TOBRATROUGH, TOBRAPEAK, TOBRARND, AMIKACINPEAK, AMIKACINTROU, AMIKACIN in the last 72 hours.   Microbiology: Recent Results (from the past 720 hour(s))  Urine culture     Status: None   Collection Time: 06/03/14  7:45 PM  Result Value Ref Range Status   Specimen Description URINE, CLEAN CATCH  Final   Special Requests NONE  Final   Colony Count   Final    40,000 COLONIES/ML Performed at [facility]    Culture   Final    Multiple bacterial morphotypes present, none predominant. Suggest appropriate recollection if clinically indicated. Performed at [facility]    Report Status 06/05/2014 FINAL  Final    Medical History: Past Medical History  Diagnosis Date  . Coronary artery disease     Remote PCI. Had BMS to RCA and DES stent to Diagonal in Jan. 2012   . Diabetes mellitus     long standing  . Diastolic heart failure     EF 55 to 60% per cath in Jan 2012  . Hyperlipidemia   . Hypertension   .  Carotid bruit   . Gout   . Obesity   . TIA (transient ischemic attack)   . Chronic kidney disease     Stage 3  . Ischemic cardiomyopathy     with prior EF of 35%  . Anemia   . Proteinuria   . NSTEMI (non-ST elevated myocardial infarction) Jan 2012    with PCI to RCA and DX per Dr. Excell Seltzerooper  . Carotid artery occlusion   . Stroke 2013    ?  mini    . DVT (deep venous thrombosis)     Assessment: [age] YOF who was on rocephin D#5 for possible SIRS d/t UTI, she had low grade fever and chest xray concerning for pneumonia. Will broaden abx coverage to vancomycin and aztreonam. Scr 1.34, est. crcl ~ 25-30 ml/min.   Goal of Therapy:  Vancomycin trough level 15-20 mcg/ml  Plan:  - Vancomycin 750 mg iv q 24 hrs - Adjust aztreonam to 1g IV Q 8 hrs - f/u cultures and renal function - vancomycin trough at steady state.  Bayard HuggerMei Notnamed Scholz, PharmD, BCPS  Clinical Pharmacist  Pager: [phone]   06/07/2014,10:53 AM

## 2014-06-08 LAB — BASIC METABOLIC PANEL
ANION GAP: 5 (ref 5–15)
BUN: 33 mg/dL — ABNORMAL HIGH (ref 6–23)
CO2: 27 mmol/L (ref 19–32)
Calcium: 8.2 mg/dL — ABNORMAL LOW (ref 8.4–10.5)
Chloride: 109 mmol/L (ref 96–112)
Creatinine, Ser: 1.51 mg/dL — ABNORMAL HIGH (ref 0.50–1.10)
GFR calc Af Amer: 35 mL/min — ABNORMAL LOW (ref >90)
GFR, EST NON AFRICAN AMERICAN: 31 mL/min — AB (ref >90)
GLUCOSE: 124 mg/dL — AB (ref 70–99)
Potassium: 4.5 mmol/L (ref 3.5–5.1)
Sodium: 141 mmol/L (ref 135–145)

## 2014-06-08 LAB — GLUCOSE, CAPILLARY
GLUCOSE-CAPILLARY: 161 mg/dL — AB (ref 70–99)
Glucose-Capillary: 138 mg/dL — ABNORMAL HIGH (ref 70–99)
Glucose-Capillary: 127 mg/dL — ABNORMAL HIGH (ref 70–99)
Glucose-Capillary: 127 mg/dL — ABNORMAL HIGH (ref 70–99)

## 2014-06-08 LAB — CBC
HCT: 30.8 % — ABNORMAL LOW (ref 36.0–46.0)
Hemoglobin: 9.8 g/dL — ABNORMAL LOW (ref 12.0–15.0)
MCH: 29.3 pg (ref 26.0–34.0)
MCHC: 31.8 g/dL (ref 30.0–36.0)
MCV: 91.9 fL (ref 78.0–100.0)
Platelets: 168 10*3/uL (ref 150–400)
RBC: 3.35 MIL/uL — ABNORMAL LOW (ref 3.87–5.11)
RDW: 15.3 % (ref 11.5–15.5)
WBC: 4.7 10*3/uL (ref 4.0–10.5)

## 2014-06-08 MED ORDER — WHITE PETROLATUM GEL
Status: AC
  Administered 2014-06-08: 01:00:00
  Filled 2014-06-08: qty 1

## 2014-06-08 NOTE — Progress Notes (Signed)
Patient ID: [name], female   DOB: [birth year], 81 y.o.   MRN: [id number]  [facility] PROGRESS NOTE  [name] NWG:[id number] DOB: May 04, 1927 DOA: 06/03/2014 PCP: Sharon Seller, NP   Brief narrative:    81 y.o. female with HTN, HLD, ulcer of the LLE and has hx of stents to LLE for PVD, presented to [facility] ED with main concern of several days duration of progressively worsening right groin pain radiating to lower abd area. This has been associated with subjective fevers, chills, malaise, difficulty with ambulation due to pain. Pain is sharp and intermittent, 7/10 in severity when present with no specific alleviating factors, aggravated with movement, no similar events in the past.   In ED, VS notable for T 102 F, HR 96 bpm, blood work notable for Cr 1.61. UA with few bacteria and small leukocytes. TRH asked to admit for further evaluation. Pt started on Rocephin in ED.   Major events since admission: 3/19 - low grade fever, CXR with developing PNA and pulmonary vascular congestion  3/20 - better this AM, denies chest pain or shortness of breath   Assessment/Plan:    Principal Problem:   New fever 3/19 with rhonchi on exam - CXR with developing PNA and possible pulmonary vascular congestion - placed on broad spectrum ABX to cover for HCAP as pt was already on Rocephin (3/19), continue same regimen for now - narrow down abx in AM if pt continues doing well  - PNA order set in place - sputum culture to be sent for analysis, urine legionella and strep pnemo also requested and still pending  - pt remains afebrile over the past 24 hours    ? Pulmonary vascular congestion - 2 D ECHO requested and still pending  - placed on Lasix 20 mg IV QD 3/19 - weight on admission was 145 lbs and on 3/19 weight was 155 lbs - continue Lasix as noted above and monitor daily weights  - daily weights, strict I's and O's   SIRS secondary to UTI  - no signs of pyelonephritis on CT abd (but was  done without contrast)  - urine culture with multiple morphotypes and recollection recommended - since pt has already been started on ABX, no need to recollect urine culture as it will likely be negative - continued Rocephin for 5 days, changed of ABX as noted above Active Problems:   Chronic kidney disease, stage III (moderate) - baseline Cr 1.3 - 1.7 in the recent past - Cr trending: 1.61 --> 1.56 --> 1.53 --> 1.41 --> 1.34 --> 1.51 - will repeat BMP in AM   HTN - continue Coreg and Clonidine per home medical regimen - also now on Lasix    HLD - continue statin    Anemia of chronic disease, CKD, IDA - anemia panel with low iron - no signs of active bleeding - placed on iron supplementation  - repeat CBC in AM   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  Family Communication:  plan of care discussed with the patient and daughter over the phone  Disposition Plan: New PNA, so needs to remain  hospitalized   IV access:  Peripheral IV  Procedures and diagnostic studies:    Ct Abdomen Pelvis Wo Contrast  06/04/2014   No acute findings are evident in the abdomen or pelvis.  Colonic diverticulosis  3 cm left adnexal cyst.  Consider sonography for characterization.     Dg Chest 2 View  06/03/2014  Cardiomegaly without decompensation.  Chronic changes.    Medical Consultants:  None  Other Consultants:  None  IAnti-Infectives:   Rocephin 3/15 --> 3/19 Vancomycin 3/19 --> Aztreonam 3/19 -->  Debbora Presto, MD  [facility] Pager [phone]  If 7PM-7AM, please contact night-coverage [email or web address] Password [email or web address] 06/08/2014, 1:18 PM   LOS: 5 days   HPI/Subjective: Low grade fever this  AM.  Objective: Filed Vitals:   06/07/14 0808 06/07/14 1311 06/07/14 1953 06/08/14 0529  BP: 142/45 151/37 147/31 160/40  Pulse: 72 70 68 60  Temp:  98.1 F (36.7 C) 98.1 F (36.7 C) 97.6 F (36.4 C)  TempSrc:  Oral Oral Oral  Resp:  Weight:    70.489 kg (155 lb 6.4 oz)  SpO2:  99% 97% 94%    Intake/Output Summary (Last 24 hours) at 06/08/14 1318 Last data filed at 06/08/14 0800  Gross per 24 hour  Intake    240 ml  Output   1200 ml  Net   -960 ml    Exam:   General:  Pt is alert, follows commands appropriately, not in acute distress  Cardiovascular: Regular rate and rhythm, no rubs, no gallops  Respiratory: Rhonchi and mild bibasilar crackles, improved compared to yesterday   Abdomen: Soft, non tender, non distended, bowel sounds present, no guarding  Extremities:  pulses DP and PT palpable bilaterally  Neuro: Grossly nonfocal  Data Reviewed: Basic Metabolic Panel:  Recent Labs Lab 06/04/14 0720 06/05/14 0724 06/06/14 0502 06/07/14 0529 06/08/14 0708  NA 138 141 143 139 141  K 4.5 4.3 4.2 4.2 4.5  CL 107 108 111 108 109  CO2 GLUCOSE 144* 132* 142* 147* 124*  BUN 26* 29* 25* 22 33*  CREATININE 1.56* 1.53* 1.41* 1.34* 1.51*  CALCIUM 7.9* 8.3* 8.2* 8.3* 8.2*   Liver Function Tests:  Recent Labs Lab 06/03/14 1838  AST 18  ALT 10  ALKPHOS 67  BILITOT 0.4  PROT 5.5*  ALBUMIN 2.8*   CBC:  Recent Labs Lab 06/03/14 1838 06/04/14 0720 06/05/14 0724 06/06/14 0502 06/07/14 0529 06/08/14 0708  WBC 6.8 6.0 4.2 3.7* 5.0 4.7  NEUTROABS 5.4  --   --   --   --   --   HGB 11.5* 10.7* 10.4* 9.7* 10.8* 9.8*  HCT 35.7* 32.7* 32.2* 29.8* 33.6* 30.8*  MCV 91.3 90.8 90.2 90.9 90.8 91.9  PLT 145* PLATELET CLUMPS NOTED ON SMEAR, UNABLE TO ESTIMATE 133* 144* 161 168   CBG:  Recent Labs Lab 06/07/14 0625 06/07/14 1915 06/07/14 2113 06/08/14 0630 06/08/14 1231  GLUCAP 162* 229* 106* 138* 161*    Scheduled Meds: .  aspirin  81 mg Oral Daily  . aztreonam  1 g Intravenous Q8H  . carvedilol  3.125 mg Oral BID WC  . cloNIDine  0.1 mg Oral QHS  . colesevelam  625 mg Oral BID WC  . ezetimibe-simvastatin  0.5 tablet Oral q1800  . ferrous sulfate  325 mg Oral BID WC  . furosemide  20 mg Intravenous Daily  . heparin  5,000 Units Subcutaneous 3 times per day  .  insulin aspart  0-9 Units Subcutaneous TID WC  . linagliptin  5 mg Oral Daily  . omega-3 acid ethyl esters  1 g Oral BID  . timolol  1 drop Left Eye Daily  . trimethoprim-polymyxin b  1 drop Both Eyes BID  . vancomycin  750 mg Intravenous Q24H

## 2014-06-09 ENCOUNTER — Telehealth: Payer: Self-pay | Admitting: Nurse Practitioner

## 2014-06-09 LAB — BASIC METABOLIC PANEL
Anion gap: 6 (ref 5–15)
BUN: 31 mg/dL — ABNORMAL HIGH (ref 6–23)
CALCIUM: 8.4 mg/dL (ref 8.4–10.5)
CHLORIDE: 109 mmol/L (ref 96–112)
CO2: 26 mmol/L (ref 19–32)
CREATININE: 1.44 mg/dL — AB (ref 0.50–1.10)
GFR calc Af Amer: 37 mL/min — ABNORMAL LOW (ref >90)
GFR calc non Af Amer: 32 mL/min — ABNORMAL LOW (ref >90)
Glucose, Bld: 138 mg/dL — ABNORMAL HIGH (ref 70–99)
POTASSIUM: 4.5 mmol/L (ref 3.5–5.1)
SODIUM: 141 mmol/L (ref 135–145)

## 2014-06-09 LAB — LEGIONELLA ANTIGEN, URINE

## 2014-06-09 LAB — GLUCOSE, CAPILLARY: Glucose-Capillary: 152 mg/dL — ABNORMAL HIGH (ref 70–99)

## 2014-06-09 LAB — CBC
HEMATOCRIT: 31.5 % — AB (ref 36.0–46.0)
Hemoglobin: 10.1 g/dL — ABNORMAL LOW (ref 12.0–15.0)
MCH: 29.6 pg (ref 26.0–34.0)
MCHC: 32.1 g/dL (ref 30.0–36.0)
MCV: 92.4 fL (ref 78.0–100.0)
Platelets: 197 10*3/uL (ref 150–400)
RBC: 3.41 MIL/uL — ABNORMAL LOW (ref 3.87–5.11)
RDW: 15 % (ref 11.5–15.5)
WBC: 4.8 10*3/uL (ref 4.0–10.5)

## 2014-06-09 NOTE — Discharge Summary (Signed)
Physician Discharge Summary  [name] ZOX:[id number] DOB: November 14, 1927 DOA: 06/03/2014  PCP: Sharon SellerEUBANKS, JESSICA K, NP  Admit date: 06/03/2014 Discharge date: 06/09/2014  Recommendations for Outpatient Follow-up:  1. Pt will need to follow up with PCP in 2-3 weeks post discharge 2. Please obtain BMP to evaluate electrolytes and kidney function 3. Please also check CBC to evaluate Hg and Hct levels 4. Pt needs to complete Levaquin upon discharge 5. Please note that pt started on Tradjenta for newly diagnosed DM, A1C 8.5 6. Please note that 2 D ECHO pending upon discharge 7. Pt did not want to take Lasix at home, weight on discharge in 154 lbs 8. Please recheck the weight and if needed consider adding lasix  9. Pt also started on Iron supplementation as iron level was low at 18 10. CT abd with Left adnexal cyst, will need close outpatient follow up with pelvic US in near future   Discharge Diagnoses:  Principal Problem:   Urinary tract infection Active Problems:   Chronic kidney disease, stage III (moderate)   Fever  Discharge Condition: Stable  Diet recommendation: Heart healthy diet discussed in details    Brief narrative:    81 y.o. female with HTN, HLD, ulcer of the LLE and has hx of stents to LLE for PVD, [facility] with main concern of several days duration of progressively worsening right groin pain radiating to lower abd area. This has been associated with subjective fevers, chills, malaise, difficulty with ambulation due to pain. Pain is sharp and intermittent, 7/10 in severity when present with no specific alleviating factors, aggravated with movement, no similar events in the past.   In ED, VS notable for T 102 F, HR 96 bpm, blood work notable for Cr 1.61. UA with few bacteria and small leukocytes. TRH asked to admit for further evaluation. Pt started on Rocephin in ED.   Major events since admission: 3/19 - low grade fever, CXR with developing PNA and pulmonary  vascular congestion  3/20 - better this AM, denies chest pain or shortness of breath   Assessment/Plan:    Principal Problem:  New fever 3/19 with rhonchi on exam - CXR with developing PNA and possible pulmonary vascular congestion - placed on broad spectrum ABX to cover for HCAP as pt was already on Rocephin (3/19) and pt responded well, stable this AM and wants to go home, will transition to oral Levaquin upon discharge  - narrow down abx in AM if pt continues doing well  - sputum cultures negative to date  - pt remains afebrile over the past 48 hours   ? Pulmonary vascular congestion - 2 D ECHO requested and pending upon discharge  - placed on Lasix 20 mg IV QD 3/19 - weight on admission was 145 lbs and on 3/19  - pt euvolemic on exam and did not want to continue taking it - advised to discuss with PCP if Lasix needed   SIRS secondary to UTI  - no signs of pyelonephritis on CT abd (but was done without contrast)  - urine culture with multiple morphotypes and recollection recommended - since pt has already been started on ABX, no need to recollect urine culture  - continued Rocephin for 5 days Active Problems:  Chronic kidney disease, stage III (moderate) - baseline Cr 1.3 - 1.7 in the recent past - Cr trending: 1.61 --> 1.56 --> 1.53 --> 1.41 --> 1.34 --> 1.51 --> 1.44  HTN - continue Coreg and Clonidine  per home medical regimen  HLD - continue statin   Anemia of chronic disease, CKD, IDA - anemia panel with low iron - no signs of active bleeding - placed on iron supplementation   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  Family Communication: plan of care discussed with the  patient and daughter over the phone  Disposition Plan: New PNA, so needs to remain hospitalized   IV access:  Peripheral IV  Procedures and diagnostic studies:   Ct Abdomen Pelvis Wo Contrast 06/04/2014 No acute findings are evident in the abdomen or pelvis. Colonic diverticulosis 3 cm left adnexal cyst. Consider sonography for characterization.   Dg Chest 2 View 06/03/2014 Cardiomegaly without decompensation. Chronic changes.   Medical Consultants:  None  Other Consultants:  None  IAnti-Infectives:   Rocephin 3/15 --> 3/19 Vancomycin 3/19 --> 3/21 Aztreonam 3/19 --> 3/21 Levaquin 3/21 -->       Discharge Exam: Filed Vitals:   06/09/14 0500  BP: 159/45  Pulse: 57  Temp: 97.7 F (36.5 C)  Resp: 16   Filed Vitals:   06/08/14 0529 06/08/14 1435 06/08/14 2019 06/09/14 0500  BP: 160/40 154/41 175/39 159/45  Pulse: 60 66 62 57  Temp: 97.6 F (36.4 C) 98.6 F (37 C) 97.6 F (36.4 C) 97.7 F (36.5 C)  TempSrc: Oral Oral Oral Oral  Resp: Weight: 70.489 kg (155 lb 6.4 oz)   69.99 kg (154 lb 4.8 oz)  SpO2: 94% 94% 98% 96%    General: Pt is alert, follows commands appropriately, not in acute distress Cardiovascular: Regular rate and rhythm, S1/S2 +, no murmurs, no rubs, no gallops Respiratory: Clear to auscultation bilaterally, no wheezing, no crackles, no rhonchi Abdominal: Soft, non tender, non distended, bowel sounds +, no guarding Extremities: no edema, no cyanosis, pulses palpable bilaterally DP and PT Neuro: Grossly nonfocal  Discharge Instructions  Discharge Instructions    Diet - low sodium heart healthy    Complete by:  As directed      Increase activity slowly    Complete by:  As directed             Medication List    TAKE these medications        aspirin 81 MG tablet  Take 81 mg by mouth daily. For heart health     carvedilol 3.125 MG tablet  Commonly known as:  COREG  Take 1 tablet (3.125 mg total) by  mouth daily.     cloNIDine 0.1 MG tablet  Commonly known as:  CATAPRES  Take 1 tablet (0.1 mg total) by mouth at bedtime.     ferrous sulfate 325 (65 FE) MG tablet  Take 1 tablet (325 mg total) by mouth 2 (two) times daily with a meal.     levofloxacin 500 MG tablet  Commonly known as:  LEVAQUIN  Take 1 tablet (500 mg total) by mouth daily.     linagliptin 5 MG Tabs tablet  Commonly known as:  TRADJENTA  Take 1 tablet (5 mg total) by mouth daily.     nitroGLYCERIN 0.4 MG SL tablet  Commonly known as:  NITROSTAT  Dissolve one tablet under tongue as needed for chest pain. May repeat as needed every 5 minutes up to 3 doses. Seek medical attention if pain is not relieved after 3 doses     omega-3 acid ethyl esters 1 G capsule  Commonly known as:  LOVAZA  TAKE 2 CAPSULES BY MOUTH EVERY NIGHT AT BEDTIME FOR 30 DAYS     timolol 0.5 % ophthalmic solution  Commonly known as:  TIMOPTIC  Place 1 drop into the left eye daily.     trimethoprim-polymyxin b ophthalmic solution  Commonly known as:  POLYTRIM  Place 1 drop into both eyes 2 (two) times daily.     VYTORIN 10-20 MG per tablet  Generic drug:  ezetimibe-simvastatin  TAKE 1/2 TABLET BY MOUTH EVERY NIGHT AT BEDTIME FOR 30 DAYS     WELCHOL 625 MG tablet  Generic drug:  colesevelam  TAKE 1 TABLET BY MOUTH TWICE DAILY WITH MEALS           Follow-up Information    Follow up with Janyth Contes, JESSICA K, NP.   Specialty:  Nurse Practitioner   Contact information:   [address] [place] [address] [phone]       Follow up with Debbora Presto, MD.   Specialty:  Internal Medicine   Why:  As needed call my cell phone [phone]   Contact information:   [address] [place] [address] [phone]        The results of significant diagnostics from this hospitalization (including imaging, microbiology, ancillary and laboratory) are listed below for reference.      Microbiology: Recent Results (from the past 240 hour(s))  Urine culture     Status: None   Collection Time: 06/03/14  7:45 PM  Result Value Ref Range Status   Specimen Description URINE, CLEAN CATCH  Final   Special Requests NONE  Final   Colony Count   Final    40,000 COLONIES/ML Performed at [facility]    Culture   Final    Multiple bacterial morphotypes present, none predominant. Suggest appropriate recollection if clinically indicated. Performed at [facility]    Report Status 06/05/2014 FINAL  Final  Culture, blood (routine x 2) Call MD if unable to obtain prior to antibiotics being given     Status: None (Preliminary result)   Collection Time: 06/07/14 12:34 PM  Result Value Ref Range Status   Specimen Description BLOOD LEFT ASSIST CONTROL  Final   Special Requests BOTTLES DRAWN AEROBIC ONLY 3CC  Final   Culture   Final           BLOOD CULTURE RECEIVED NO GROWTH TO DATE CULTURE WILL BE HELD FOR 5 DAYS BEFORE ISSUING A FINAL NEGATIVE REPORT Performed at [facility]    Report Status PENDING  Incomplete  Culture, blood (routine x 2) Call MD if unable to obtain prior to antibiotics being given     Status: None (Preliminary result)   Collection Time: 06/07/14 12:46 PM  Result Value Ref Range Status   Specimen Description BLOOD LEFT HAND  Final   Special Requests BOTTLES DRAWN AEROBIC ONLY 5CC  Final   Culture   Final           BLOOD CULTURE RECEIVED NO GROWTH TO DATE CULTURE WILL BE HELD FOR 5 DAYS BEFORE ISSUING A FINAL NEGATIVE REPORT Performed at [facility]    Report Status PENDING  Incomplete     Labs: Basic  Metabolic Panel:  Recent Labs Lab 06/05/14 0724 06/06/14 0502 06/07/14 0529 06/08/14 0708 06/09/14 0645  NA 141 143 139 141 141  K 4.3 4.2 4.2 4.5 4.5  CL 108 111 108 109 109  CO2 GLUCOSE 132* 142* 147* 124* 138*  BUN 29* 25* 22 33* 31*  CREATININE 1.53* 1.41* 1.34* 1.51* 1.44*  CALCIUM 8.3*  8.2* 8.3* 8.2* 8.4   Liver Function Tests:  Recent Labs Lab 06/03/14 1838  AST 18  ALT 10  ALKPHOS 67  BILITOT 0.4  PROT 5.5*  ALBUMIN 2.8*   CBC:  Recent Labs Lab 06/03/14 1838  06/05/14 0724 06/06/14 0502 06/07/14 0529 06/08/14 0708 06/09/14 0645  WBC 6.8  < > 4.2 3.7* 5.0 4.7 4.8  NEUTROABS 5.4  --   --   --   --   --   --   HGB 11.5*  < > 10.4* 9.7* 10.8* 9.8* 10.1*  HCT 35.7*  < > 32.2* 29.8* 33.6* 30.8* 31.5*  MCV 91.3  < > 90.2 90.9 90.8 91.9 92.4  PLT 145*  < > 133* 144* 161 168 197  < > = values in this interval not displayed.  BNP: BNP (last 3 results)  Recent Labs  06/03/14 1838  BNP 530.3*   CBG:  Recent Labs Lab 06/08/14 0630 06/08/14 1231 06/08/14 1743 06/08/14 2133 06/09/14 0634  GLUCAP 138* 161* 127* 127* 152*   SIGNED: Time coordinating discharge: Over 30 minutes  Debbora Presto, MD  [facility] 06/09/2014, 8:48 AM Pager [phone]  If 7PM-7AM, please contact night-coverage [email or web address] Password [email or web address]

## 2014-06-09 NOTE — Telephone Encounter (Signed)
New Message  Pt d/c from hosp today 3/21 after [place] [facility] [place]. Pt daughter calling to make next avail appt, offered 4/20 appt but pt wanted to speak w/ Rn about something earlier. Please call back and dsicuss.

## 2014-06-09 NOTE — Telephone Encounter (Signed)
T/w pt's daughter scheduled pt appointment on Friday, April 1st.  Will Lyndle HerrlichFYI Lori

## 2014-06-09 NOTE — Telephone Encounter (Signed)
OK 

## 2014-06-11 ENCOUNTER — Encounter: Payer: Self-pay | Admitting: Vascular Surgery

## 2014-06-12 ENCOUNTER — Ambulatory Visit (INDEPENDENT_AMBULATORY_CARE_PROVIDER_SITE_OTHER): Payer: Medicare Other | Admitting: *Deleted

## 2014-06-12 DIAGNOSIS — IMO0001 Reserved for inherently not codable concepts without codable children: Secondary | ICD-10-CM

## 2014-06-12 DIAGNOSIS — I83011 Varicose veins of right lower extremity with ulcer of thigh: Secondary | ICD-10-CM

## 2014-06-12 NOTE — Progress Notes (Signed)
Patient arrived for unna boot change add stated that her leg and wound feel much better.  She declined the [place] boot.  The wound measures approximately 1.5cm and has a shiny surface and is not draining. A dry 2x2 was placed over the wound and wrapped with a six inch ace wrap.  Per Dr Imogene Burnhen patient is to return as needed.

## 2014-06-13 LAB — CULTURE, BLOOD (ROUTINE X 2)
Culture: NO GROWTH
Culture: NO GROWTH

## 2014-06-16 ENCOUNTER — Encounter: Payer: Self-pay | Admitting: Nurse Practitioner

## 2014-06-16 ENCOUNTER — Ambulatory Visit (INDEPENDENT_AMBULATORY_CARE_PROVIDER_SITE_OTHER): Payer: Medicare Other | Admitting: Nurse Practitioner

## 2014-06-16 VITALS — BP 120/70 | HR 70 | Temp 97.7°F | Resp 20 | Ht 60.0 in | Wt 147.6 lb

## 2014-06-16 DIAGNOSIS — I5032 Chronic diastolic (congestive) heart failure: Secondary | ICD-10-CM | POA: Diagnosis not present

## 2014-06-16 DIAGNOSIS — K5900 Constipation, unspecified: Secondary | ICD-10-CM | POA: Diagnosis not present

## 2014-06-16 DIAGNOSIS — D509 Iron deficiency anemia, unspecified: Secondary | ICD-10-CM

## 2014-06-16 DIAGNOSIS — L97319 Non-pressure chronic ulcer of right ankle with unspecified severity: Secondary | ICD-10-CM

## 2014-06-16 DIAGNOSIS — N949 Unspecified condition associated with female genital organs and menstrual cycle: Secondary | ICD-10-CM

## 2014-06-16 DIAGNOSIS — N3000 Acute cystitis without hematuria: Secondary | ICD-10-CM | POA: Diagnosis not present

## 2014-06-16 DIAGNOSIS — N183 Chronic kidney disease, stage 3 unspecified: Secondary | ICD-10-CM

## 2014-06-16 DIAGNOSIS — E1159 Type 2 diabetes mellitus with other circulatory complications: Secondary | ICD-10-CM | POA: Diagnosis not present

## 2014-06-16 DIAGNOSIS — E1151 Type 2 diabetes mellitus with diabetic peripheral angiopathy without gangrene: Secondary | ICD-10-CM

## 2014-06-16 MED ORDER — FERROUS SULFATE 325 (65 FE) MG PO TABS: 325.0000 mg | ORAL_TABLET | Freq: Two times a day (BID) | ORAL | Status: DC

## 2014-06-16 MED ORDER — LINAGLIPTIN 5 MG PO TABS: 5.0000 mg | ORAL_TABLET | Freq: Every day | ORAL | Status: DC

## 2014-06-16 MED ORDER — FUROSEMIDE 20 MG PO TABS: 20.0000 mg | ORAL_TABLET | Freq: Every day | ORAL | Status: DC

## 2014-06-16 MED ORDER — POLYETHYLENE GLYCOL 3350 17 GM/SCOOP PO POWD: 17.0000 g | Freq: Every day | ORAL | Status: DC

## 2014-06-16 NOTE — Progress Notes (Signed)
Patient ID: [name], female   DOB: 04/15/1927, 81 y.o.   MRN: [id number]    PCP: Sharon Seller, NP  Allergies  Allergen Reactions  . Percocet [Oxycodone-Acetaminophen] Other (See Comments)  . Sulfa Antibiotics Other (See Comments)    Tongue swells  . Penicillins Hives    Tolerates Ceftriaxone    Chief Complaint  Patient presents with  . Hospitalization Follow-up     HPI: Patient is a 81 y.o. female seen in the office today to follow up hospitalization, pt was hospitalized after fever and treated for possible PNA and UTI.   Completed Levaquin on discharge.  Was found to have low iron, currently on iron twice daily. Pt with pulmonary vascular congestion,  placed on Lasix 20 mg IV QD in the hospital but did not want to continue taking- weight on admission was 145 lbs and on 3/19--Reports she is retaining fluid currently, legs more swollen. Denies shortness of breath or chest pains.   A1c was worse in the hospital, denies taking any medication for diabetes however discharge summary reports she was discharged on Januvia-- however Januvia is NOT on her medication list from hospital but [name] is listed. Reports she is not taking this either. Reports she did not know she was supposed to be on medication for diabetes   Left adnexal cyst noted on CT abd/pelvis which was felt could be monitoring outpatient.    Reports worsening constipation. Drinking more coffee and taking stool softeners-- went three times day but not complete bowel movements. Small movements.    Review of Systems:  Review of Systems  Constitutional: Positive for fatigue. Negative for fever, chills and diaphoresis.  Respiratory: Negative for cough and shortness of breath.   Cardiovascular: Positive for leg swelling. Negative for chest pain.  Gastrointestinal: Positive for constipation. Negative for abdominal pain and diarrhea.  Genitourinary: Negative for dysuria and urgency.  Musculoskeletal: Negative for  myalgias.  Skin:       Venous ulcer on right ankle - much better  Neurological: Negative for dizziness, weakness, light-headedness, numbness and headaches.  Psychiatric/Behavioral: The patient is not nervous/anxious.     Past Medical History  Diagnosis Date  . Coronary artery disease     Remote PCI. Had BMS to RCA and DES stent to Diagonal in Jan. 2012   . Diabetes mellitus     long standing  . Diastolic heart failure     EF 55 to 60% per cath in Jan 2012  . Hyperlipidemia   . Hypertension   . Carotid bruit   . Gout   . Obesity   . TIA (transient ischemic attack)   . Chronic kidney disease     Stage 3  . Ischemic cardiomyopathy     with prior EF of 35%  . Anemia   . Proteinuria   . NSTEMI (non-ST elevated myocardial infarction) Jan 2012    with PCI to RCA and DX per Dr. Excell Seltzer  . Carotid artery occlusion   . Stroke 2013    ?  mini    . DVT (deep venous thrombosis)    Past Surgical History  Procedure Laterality Date  . Appendectomy    . Tonsillectomy    . Left ankle fusion    . Distal rca  04/05/2010    stent  . Fracture surgery Right 2005    bilateral ankles   . Abdominal aortagram N/A 01/17/2014    Procedure: ABDOMINAL AORTAGRAM;  Surgeon: Sherren Kerns, MD;  Location: [facility]  CATH LAB;  Service: Cardiovascular;  Laterality: N/A;   Social History:   reports that she quit smoking about 46 years ago. She has never used smokeless tobacco. She reports that she does not drink alcohol or use illicit drugs.  History reviewed. No pertinent family history.  Medications: Patient's Medications  New Prescriptions   No medications on file  Previous Medications   ASPIRIN 81 MG TABLET    Take 81 mg by mouth daily. For heart health   CARVEDILOL (COREG) 3.125 MG TABLET    Take 1 tablet (3.125 mg total) by mouth daily.   CLONIDINE (CATAPRES) 0.1 MG TABLET    Take 1 tablet (0.1 mg total) by mouth at bedtime.   FERROUS SULFATE 325 (65 FE) MG TABLET    Take 1 tablet (325 mg  total) by mouth 2 (two) times daily with a meal.   LINAGLIPTIN (TRADJENTA) 5 MG TABS TABLET    Take 1 tablet (5 mg total) by mouth daily.   NITROGLYCERIN (NITROSTAT) 0.4 MG SL TABLET    Dissolve one tablet under tongue as needed for chest pain. May repeat as needed every 5 minutes up to 3 doses. Seek medical attention if pain is not relieved after 3 doses   OMEGA-3 ACID ETHYL ESTERS (LOVAZA) 1 G CAPSULE    TAKE 2 CAPSULES BY MOUTH EVERY NIGHT AT BEDTIME FOR 30 DAYS   TIMOLOL (TIMOPTIC) 0.5 % OPHTHALMIC SOLUTION    Place 1 drop into the left eye daily.    TRIMETHOPRIM-POLYMYXIN B (POLYTRIM) OPHTHALMIC SOLUTION    Place 1 drop into both eyes 2 (two) times daily.    VYTORIN 10-20 MG PER TABLET    TAKE 1/2 TABLET BY MOUTH EVERY NIGHT AT BEDTIME FOR 30 DAYS   WELCHOL 625 MG TABLET    TAKE 1 TABLET BY MOUTH TWICE DAILY WITH MEALS  Modified Medications   No medications on file  Discontinued Medications   LEVOFLOXACIN (LEVAQUIN) 500 MG TABLET    Take 1 tablet (500 mg total) by mouth daily.     Physical Exam:  Filed Vitals:   06/16/14 1420  BP: 120/70  Pulse: 70  Temp: 97.7 F (36.5 C)  TempSrc: Oral  Resp: 20  Height: 5' (1.524 m)  Weight: 147 lb 9.6 oz (66.951 kg)  SpO2: 92%    Physical Exam  Constitutional: She is oriented to person, place, and time. She appears well-developed and well-nourished. No distress.  HENT:  Head: Normocephalic and atraumatic.  Mouth/Throat: Oropharynx is clear and moist. No oropharyngeal exudate.  Eyes: Conjunctivae are normal. Pupils are equal, round, and reactive to light.  Neck: Normal range of motion. Neck supple. No JVD present.  Cardiovascular: Normal rate, regular rhythm and normal heart sounds.   Pulmonary/Chest: Effort normal and breath sounds normal.  Abdominal: Soft. Bowel sounds are normal.  Musculoskeletal: She exhibits edema (2+ bilaterally). She exhibits no tenderness.  Neurological: She is alert and oriented to person, place, and time.    Skin: Skin is warm and dry. She is not diaphoretic.  Psychiatric: She has a normal mood and affect.    Labs reviewed: Basic Metabolic Panel:  Recent Labs  86/57/8401/13/16 1105 05/01/14 1041  06/07/14 0529 06/08/14 0708 06/09/14 0645  NA 140  --   < > 139 141 141  K 4.8  --   < > 4.2 4.5 4.5  CL 107  --   < > 108 109 109  CO2 26  --   < > 20 27  26  GLUCOSE 146*  --   < > 147* 124* 138*  BUN 31*  --   < > 22 33* 31*  CREATININE 1.25*  --   < > 1.34* 1.51* 1.44*  CALCIUM 8.8  --   < > 8.3* 8.2* 8.4  TSH 5.43* 4.25  --   --   --   --   < > = values in this interval not displayed. Liver Function Tests:  Recent Labs  12/29/13 2139  01/18/14 0456 02/10/14 1212 06/03/14 1838  AST 16  < > ALT 9  < > ALKPHOS 63  < > 64 75 67  BILITOT 0.3  < > 0.5 0.3 0.4  PROT 6.1  < > 5.6* 6.5 5.5*  ALBUMIN 3.0*  --  2.7*  --  2.8*  < > = values in this interval not displayed. No results for input(s): LIPASE, AMYLASE in the last 8760 hours. No results for input(s): AMMONIA in the last 8760 hours. CBC:  Recent Labs  12/29/13 2139  02/10/14 1212  06/03/14 1838  06/07/14 0529 06/08/14 0708 06/09/14 0645  WBC 4.4  < > 5.1  < > 6.8  < > 5.0 4.7 4.8  NEUTROABS 2.2  --  2.8  --  5.4  --   --   --   --   HGB 10.1*  < > 10.7*  < > 11.5*  < > 10.8* 9.8* 10.1*  HCT 30.8*  < > 34.6  < > 35.7*  < > 33.6* 30.8* 31.5*  MCV 92.8  < > 96  < > 91.3  < > 90.8 91.9 92.4  PLT 209  < > 218  < > 145*  < > 161 168 197  < > = values in this interval not displayed. Lipid Panel:  Recent Labs  02/10/14 1212  CHOL 127  HDL 43  LDLCALC 54  TRIG 151*  CHOLHDL 3.0   TSH:  Recent Labs  04/02/14 1105 05/01/14 1041  TSH 5.43* 4.25   A1C: Lab Results  Component Value Date   HGBA1C 8.0* 06/05/2014     Assessment/Plan 1. Chronic kidney disease, stage III (moderate) -follow up Basic metabolic panel  2. Acute cystitis without hematuria Resolved, competed Levaquin   3. Ulcer of  right ankle, with unspecified severity -conts to follow up with vascular, ulcer has improved. conts to keep covered and wrapped   4. DM (diabetes mellitus), type 2 with peripheral vascular complications -A1c worse in hospital, was not given Rx upon discharge. Educated regarding tradjenta start -pt reports she already maintains heart healthy diabetic diet.  - linagliptin (TRADJENTA) 5 MG TABS tablet; Take 1 tablet (5 mg total) by mouth daily.  Dispense: 30 tablet; Refill: 3  5. Constipation, unspecified constipation type -worsening by decreased activity and iron tablets, cont on stool softener but may start miralax as directed  - polyethylene glycol powder (GLYCOLAX/MIRALAX) powder; Take 17 g by mouth daily. Daily as needed for bowels  Dispense: 3350 g; Refill: 1  6. Chronic diastolic heart failure -with worsening LE edema, pt without shortness of breath at this time but noted to have pulmonary congestion in the hospital. -will start furosemide (LASIX) 20 MG tablet; Take 1 tablet (20 mg total) by mouth daily.  Dispense: 30 tablet; Refill: 3 at this time  7. Anemia, iron deficiency - CBC with Differential/Platelet today -to continue  ferrous sulfate 325 (65 FE) MG tablet; Take 1 tablet (  325 mg total) by mouth 2 (two) times daily with a meal.  Dispense: 60 tablet; Refill: 3  8. Adnexal cyst 3 cm left adnexal cyst found on CT,will follow up US - US Pelvis Complete; Future  Follow up in 4 weeks Will get to get BMP CBC iron studies at this time of the visit

## 2014-06-16 NOTE — Patient Instructions (Signed)
NOTE MEDICATION CHANGES:  These medications are over the counter -Ferrous Sulfate (iron) 325 mg by mouth twice daily with meals for anemia -Miralax 17 gm in 4-8 oz of fluid of choice   These medications are prescription -Tradjenta 5 mg daily by mouth-- this is for diabetes  -Lasix 20 mg by mouth daily-- this is for fluid  Will order Ultrasound of abdomen to further evaluate cyst found in hospital  FOLLOW UP IN 1 month

## 2014-06-17 LAB — CBC WITH DIFFERENTIAL/PLATELET
BASOS ABS: 0 10*3/uL (ref 0.0–0.2)
BASOS: 0 %
EOS: 2 %
Eosinophils Absolute: 0.1 10*3/uL (ref 0.0–0.4)
HCT: 33.5 % — ABNORMAL LOW (ref 34.0–46.6)
Hemoglobin: 10.8 g/dL — ABNORMAL LOW (ref 11.1–15.9)
IMMATURE GRANS (ABS): 0 10*3/uL (ref 0.0–0.1)
IMMATURE GRANULOCYTES: 0 %
LYMPHS: 31 %
Lymphocytes Absolute: 2.3 10*3/uL (ref 0.7–3.1)
MCH: 29.7 pg (ref 26.6–33.0)
MCHC: 32.2 g/dL (ref 31.5–35.7)
MCV: 92 fL (ref 79–97)
MONOCYTES: 9 %
Monocytes Absolute: 0.7 10*3/uL (ref 0.1–0.9)
NEUTROS ABS: 4.4 10*3/uL (ref 1.4–7.0)
NEUTROS PCT: 58 %
PLATELETS: 267 10*3/uL (ref 150–379)
RBC: 3.64 x10E6/uL — AB (ref 3.77–5.28)
RDW: 14.7 % (ref 12.3–15.4)
WBC: 7.5 10*3/uL (ref 3.4–10.8)

## 2014-06-17 LAB — BASIC METABOLIC PANEL
BUN / CREAT RATIO: 20 (ref 11–26)
BUN: 32 mg/dL — ABNORMAL HIGH (ref 8–27)
CO2: 23 mmol/L (ref 18–29)
Calcium: 8.9 mg/dL (ref 8.7–10.3)
Chloride: 107 mmol/L (ref 97–108)
Creatinine, Ser: 1.63 mg/dL — ABNORMAL HIGH (ref 0.57–1.00)
GFR calc Af Amer: 33 mL/min/{1.73_m2} — ABNORMAL LOW (ref >59)
GFR calc non Af Amer: 29 mL/min/{1.73_m2} — ABNORMAL LOW (ref >59)
Glucose: 149 mg/dL — ABNORMAL HIGH (ref 65–99)
POTASSIUM: 5.6 mmol/L — AB (ref 3.5–5.2)
Sodium: 144 mmol/L (ref 134–144)

## 2014-06-19 ENCOUNTER — Other Ambulatory Visit: Payer: Self-pay

## 2014-06-19 DIAGNOSIS — N949 Unspecified condition associated with female genital organs and menstrual cycle: Secondary | ICD-10-CM

## 2014-06-20 ENCOUNTER — Encounter: Payer: Self-pay | Admitting: Nurse Practitioner

## 2014-06-20 ENCOUNTER — Ambulatory Visit (INDEPENDENT_AMBULATORY_CARE_PROVIDER_SITE_OTHER): Payer: Medicare Other | Admitting: Nurse Practitioner

## 2014-06-20 VITALS — BP 166/80 | HR 62 | Resp 16 | Ht 60.0 in | Wt 148.0 lb

## 2014-06-20 DIAGNOSIS — I5032 Chronic diastolic (congestive) heart failure: Secondary | ICD-10-CM | POA: Diagnosis not present

## 2014-06-20 DIAGNOSIS — I259 Chronic ischemic heart disease, unspecified: Secondary | ICD-10-CM

## 2014-06-20 DIAGNOSIS — I10 Essential (primary) hypertension: Secondary | ICD-10-CM | POA: Diagnosis not present

## 2014-06-20 NOTE — Progress Notes (Signed)
CARDIOLOGY OFFICE NOTE  Date:  06/20/2014    [name] Date of Birth: [birth year] Medical Record #[id number]  PCP:  Sharon Seller, NP  Cardiologist:  Excell Seltzer    Chief Complaint  Patient presents with  . Coronary Artery Disease    Follow up visit/Post hospital - seen for Dr. Excell Seltzer.     History of Present Illness: [name] is a 81 y.o. female who presents today for a follow up visit/post hospital visit. Seen for Dr. Excell Seltzer and followed closely by me. She has multiple issues which include CAD, remote PCI, BMS to the RCA and DES to the DX in January of 2012. She has had longstanding diabetes, HTN, HLD, diastolic heart failure with EF 55 to 60% per cath back in 2012 but has been as low as 35%, PVD (seeing Dr. Darrick Penna), gout, CKD and chronic anemia.   I have known Treacy for almost 20 years - always seems to have an issue with her social situation, her medicines or her doctors in some fashion.   Seen back in July of 2014 by me and seemed to be doing ok. Seen back in November of 2014 by PCP for some ongoing issues with anxiety/BP. Has had lisinopril added back along with some Xanax/Celexa. Having personal issues with a live in man that she does not want to evict as well as family issues in general. She has always cared for many animals in and at her home and had lots of issues with family.   I saw her back in January of 2015. Medicines had been adjusted for her BP. Still with lots of social issues with her home situation. I saw her in May of 2015- she had seen Dr. Darrick Penna recently for a foot ulcer - managed conservatively. She ultimately underwent bilateral common iliac stenting on January 17, 2014. 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 She did have a postprocedure hematoma in the groin but this is now resolved. She has been seen at the wound  center for intermittent debridement of her right medial malleolus ulcer. She was placed in an Radio broadcast assistant.   Seen in December of 2015 - off lots of her medicines. BP was up. Clonidine and Norvasc were restarted. She was on a lower dose of ARB. Last seen by me in January - she was doing better. Cardiac status stable.   She came for a follow up in March - she was febrile - T of 102. Sent on to the ER - treated for UTI and possible pneumonia.   Comes in today. Here alone. Looks to be back to her baseline. Has already seen primary care. Still with same issues that revolve around her medicines. Has not picked up her medicine for her diabetes. BP is up. Breathing is ok. Not short of breath. Denied having any swelling but then says she has lots of fluid in her legs. Has not picked up her Lasix yet - says it "was not ready". Apparently it will be delivered. No chest pain. Tells me again of her home situation. Total of 3 persons in her house - living off her social security with her 12 cats. She does have a time arranged for a pelvic ultrasound. Still very constipated - does not like Miralax. Wants an enema. Has used laxatives chronically.   Past Medical History  Diagnosis Date  .  Coronary artery disease     Remote PCI. Had BMS to RCA and DES stent to Diagonal in Jan. 2012   . Diabetes mellitus     long standing  . Diastolic heart failure     EF 55 to 60% per cath in Jan 2012  . Hyperlipidemia   . Hypertension   . Carotid bruit   . Gout   . Obesity   . TIA (transient ischemic attack)   . Chronic kidney disease     Stage 3  . Ischemic cardiomyopathy     with prior EF of 35%  . Anemia   . Proteinuria   . NSTEMI (non-ST elevated myocardial infarction) Jan 2012    with PCI to RCA and DX per Dr. Excell Seltzerooper  . Carotid artery occlusion   . Stroke 2013    ?  mini    . DVT (deep venous thrombosis)     Past Surgical History  Procedure Laterality Date  . Appendectomy    . Tonsillectomy    . Left ankle  fusion    . Distal rca  04/05/2010    stent  . Fracture surgery Right 2005    bilateral ankles   . Abdominal aortagram N/A 01/17/2014    Procedure: ABDOMINAL AORTAGRAM;  Surgeon: Sherren Kernsharles E Fields, MD;  [facility];  Service: Cardiovascular;  Laterality: N/A;     Medications: Current Outpatient Prescriptions  Medication Sig Dispense Refill  . aspirin 81 MG tablet Take 81 mg by mouth daily. For heart health    . carvedilol (COREG) 3.125 MG tablet Take 1 tablet (3.125 mg total) by mouth daily. (Patient taking differently: Take 3.125 mg by mouth 2 (two) times daily with a meal. ) 60 tablet 2  . cloNIDine (CATAPRES) 0.1 MG tablet Take 1 tablet (0.1 mg total) by mouth at bedtime. 30 tablet   . ferrous sulfate 325 (65 FE) MG tablet Take 1 tablet (325 mg total) by mouth 2 (two) times daily with a meal. 60 tablet 3  . omega-3 acid ethyl esters (LOVAZA) 1 G capsule TAKE 2 CAPSULES BY MOUTH EVERY NIGHT AT BEDTIME FOR 30 DAYS (Patient taking differently: TAKE 1 CAPSULE BY MOUTH EVERY MORNING AND 1 CAPSULE IN  EVENING) 60 capsule 5  . polyethylene glycol powder (GLYCOLAX/MIRALAX) powder Take 17 g by mouth daily. Daily as needed for bowels 3350 g 1  . timolol (TIMOPTIC) 0.5 % ophthalmic solution Place 1 drop into the left eye daily.     Marland Kitchen. trimethoprim-polymyxin b (POLYTRIM) ophthalmic solution Place 1 drop into both eyes 2 (two) times daily.     Marland Kitchen. VYTORIN 10-20 MG per tablet TAKE 1/2 TABLET BY MOUTH EVERY NIGHT AT BEDTIME FOR 30 DAYS 45 tablet 1  . WELCHOL 625 MG tablet TAKE 1 TABLET BY MOUTH TWICE DAILY WITH MEALS 30 tablet 5  . furosemide (LASIX) 20 MG tablet Take 1 tablet (20 mg total) by mouth daily. (Patient not taking: Reported on 06/20/2014) 30 tablet 3  . linagliptin (TRADJENTA) 5 MG TABS tablet Take 1 tablet (5 mg total) by mouth daily. (Patient not taking: Reported on 06/20/2014) 30 tablet 3  . nitroGLYCERIN (NITROSTAT) 0.4 MG SL tablet Dissolve one tablet under tongue as needed for chest  pain. May repeat as needed every 5 minutes up to 3 doses. Seek medical attention if pain is not relieved after 3 doses (Patient not taking: Reported on 06/20/2014) 25 tablet 11   No current facility-administered medications for this visit.  Allergies: Allergies  Allergen Reactions  . Percocet [Oxycodone-Acetaminophen] Other (See Comments)  . Sulfa Antibiotics Other (See Comments)    Tongue swells  . Penicillins Hives    Tolerates Ceftriaxone    Social History: The patient  reports that she quit smoking about 46 years ago. She has never used smokeless tobacco. She reports that she does not drink alcohol or use illicit drugs.   Family History: The patient's family history is negative for Heart disease. Both of her parents were murdered.   Review of Systems: Please see the history of present illness.   All other systems are reviewed and negative.   Physical Exam: VS:  BP 166/80 mmHg  Pulse 62  Resp 16  Ht 5' (1.524 m)  Wt 148 lb (67.132 kg)  BMI 28.90 kg/m2  SpO2 93% .  BMI Body mass index is 28.9 kg/(m^2).   BP by me is 160/60  Wt Readings from Last 3 Encounters:  06/20/14 148 lb (67.132 kg)  06/16/14 147 lb 9.6 oz (66.951 kg)  06/03/14 145 lb 6.4 oz (65.953 kg)    General: Pleasant. Well developed, well nourished and in no acute distress.  HEENT: Normal. Neck: Supple, no JVD, carotid bruits, or masses noted.  Cardiac: Regular rate and rhythm. No murmurs, rubs, or gallops. She has 1 to 2+ edema in both legs - worse on the right.  Respiratory:  Lungs are clear to auscultation bilaterally with normal work of breathing.  GI: Soft and nontender.  MS: No deformity or atrophy. Gait and ROM intact. Skin: Warm and dry. Color is normal.  Neuro:  Strength and sensation are intact and no gross focal deficits noted.  Psych: Alert, appropriate and with normal affect.   LABORATORY DATA:  EKG:  EKG is not ordered today.   Lab Results  Component Value Date   WBC 7.5  06/16/2014   HGB 10.8* 06/16/2014   HCT 33.5* 06/16/2014   PLT 267 06/16/2014   GLUCOSE 149* 06/16/2014   CHOL 127 02/10/2014   TRIG 151* 02/10/2014   HDL 43 02/10/2014   LDLCALC 54 02/10/2014   ALT 10 06/03/2014   AST 18 06/03/2014   NA 144 06/16/2014   K 5.6* 06/16/2014   CL 107 06/16/2014   CREATININE 1.63* 06/16/2014   BUN 32* 06/16/2014   CO2 23 06/16/2014   TSH 4.25 05/01/2014   INR 1.09 04/15/2010   HGBA1C 8.0* 06/05/2014    BNP (last 3 results)  Recent Labs  06/03/14 1838  BNP 530.3*    ProBNP (last 3 results) No results for input(s): PROBNP in the last 8760 hours.   Other Studies Reviewed Today:  CT ABD FINDINGS: There are no urinary calculi. There is no hydronephrosis or ureteral dilatation. There are unremarkable unenhanced appearances of the liver, spleen, pancreas and adrenals. There is extensive colonic diverticulosis. There is no evidence of diverticulitis or other acute inflammatory process in the abdomen or pelvis. There is no bowel obstruction. Oral contrast has progressed through to the rectum. There is extensive atherosclerotic calcification of the abdominal aorta and iliac vasculature, without aneurysm.  There is a 2.5 x 3.0 cm left adnexal cyst. There is a calcified uterine fibroid measuring 1.8 cm.  Mild linear scarring or atelectasis is present in both lung bases. No acute musculoskeletal abnormalities are evident. Foley catheter noted.  IMPRESSION: No acute findings are evident in the abdomen or pelvis.  Colonic diverticulosis  3 cm left adnexal cyst. Consider sonography for characterization.   Electronically  Signed  By: Ellery Plunk M.D.  On: 06/04/2014 04:15   Assessment/Plan: 1. HTN - would like to see her on Lasix - this may help.   2. CAD - no symptoms reported - will continue with medical management   3. HLD   4. PVD - followed by [facility] with Dr. Darrick Penna - with Roland Rack boot in place.  5. DM  6.  Hyperkalemia - off ARB  7. Diastolic HF - she is not short of breath. Has swelling on exam - I suspect due to sitting with legs down. Agree with the Lasix - just need for her to take once delivered.  8. Adenexal cyst - for U/S  9. Constipation  10. Recent UTI/PNA - improved.  Overall situation remains quite tenuous.   Current medicines are reviewed with the patient today.  The patient does not have concerns regarding medicines other than what has been noted above.  The following changes have been made:  See above.  Labs/ tests ordered today include:   No orders of the defined types were placed in this encounter.     Disposition:   FU with me in 4 months  Patient is agreeable to this plan and will call if any problems develop in the interim.   Signed: Rosalio Macadamia, RN, ANP-C 06/20/2014 2:27 PM  [facility] [address] [place], [place]  [address] Phone: [phone] Fax: [phone]

## 2014-06-20 NOTE — Patient Instructions (Signed)
Stay on your current medicines  Please start the Lasix (furosemide) when you get it - see if you already have at home  See me in 4 months  Ok to try a Fleets enema or 1/2 bottle mag citrate for your constipation  Call the [facility] office at [phone] if you have any questions, problems or concerns.

## 2014-06-26 ENCOUNTER — Encounter: Payer: Self-pay | Admitting: Nurse Practitioner

## 2014-06-26 ENCOUNTER — Ambulatory Visit
Admission: RE | Admit: 2014-06-26 | Discharge: 2014-06-26 | Disposition: A | Discharge status: Home / Self Care | Payer: Medicare Other | Source: Ambulatory Visit | Attending: Nurse Practitioner | Admitting: Nurse Practitioner

## 2014-06-26 ENCOUNTER — Ambulatory Visit (INDEPENDENT_AMBULATORY_CARE_PROVIDER_SITE_OTHER): Payer: Medicare Other | Admitting: Nurse Practitioner

## 2014-06-26 ENCOUNTER — Other Ambulatory Visit: Payer: Self-pay | Admitting: Internal Medicine

## 2014-06-26 ENCOUNTER — Ambulatory Visit: Payer: 59 | Admitting: Vascular Surgery

## 2014-06-26 VITALS — BP 128/72 | HR 67 | Temp 97.5°F | Ht 60.0 in | Wt 147.0 lb

## 2014-06-26 DIAGNOSIS — R600 Localized edema: Secondary | ICD-10-CM | POA: Diagnosis not present

## 2014-06-26 DIAGNOSIS — N183 Chronic kidney disease, stage 3 unspecified: Secondary | ICD-10-CM

## 2014-06-26 DIAGNOSIS — N949 Unspecified condition associated with female genital organs and menstrual cycle: Secondary | ICD-10-CM

## 2014-06-26 DIAGNOSIS — K5900 Constipation, unspecified: Secondary | ICD-10-CM | POA: Diagnosis not present

## 2014-06-26 DIAGNOSIS — E1159 Type 2 diabetes mellitus with other circulatory complications: Secondary | ICD-10-CM

## 2014-06-26 DIAGNOSIS — E1151 Type 2 diabetes mellitus with diabetic peripheral angiopathy without gangrene: Secondary | ICD-10-CM

## 2014-06-26 DIAGNOSIS — D509 Iron deficiency anemia, unspecified: Secondary | ICD-10-CM | POA: Diagnosis not present

## 2014-06-26 MED ORDER — CARVEDILOL 3.125 MG PO TABS: 3.1250 mg | ORAL_TABLET | Freq: Two times a day (BID) | ORAL | Status: DC

## 2014-06-26 NOTE — Progress Notes (Signed)
Patient ID: [name], female   DOB: 08/30/1927, 81 y.o.   MRN: [id number]    PCP: Sharon Seller, NP  Allergies  Allergen Reactions  . Percocet [Oxycodone-Acetaminophen] Other (See Comments)  . Sulfa Antibiotics Other (See Comments)    Tongue swells  . Penicillins Hives    Tolerates Ceftriaxone    Chief Complaint  Patient presents with  . Follow-up    Patient here today to follow-up on swelling and recheck labs      HPI: Patient is a 81 y.o. female seen in the office today to follow up.  Stated lasix 20 mg (based on what she had a home) she has not received her Rx from the pharmacy yet. Did not take today because she has had many appts and was worried she could not hold her water. Swelling is better.   Had [place] to look at Left adnexal cyst, US revealed benign cyst. Follow up in 1 year  Has not started tradjenta due to medication not coming from pharmacy  Taking iron, twice daily no side effects Working on constipation, taking OTC which are effective   Review of Systems:  Review of Systems  Constitutional: Negative for fever, chills, diaphoresis and fatigue.  Respiratory: Negative for cough and shortness of breath.   Cardiovascular: Positive for leg swelling. Negative for chest pain.  Gastrointestinal: Negative for abdominal pain, diarrhea and constipation.  Genitourinary: Negative for dysuria and urgency.  Musculoskeletal: Negative for myalgias.  Neurological: Negative for dizziness, weakness, light-headedness, numbness and headaches.  Psychiatric/Behavioral: The patient is not nervous/anxious.     Past Medical History  Diagnosis Date  . Coronary artery disease     Remote PCI. Had BMS to RCA and DES stent to Diagonal in Jan. 2012   . Diabetes mellitus     long standing  . Diastolic heart failure     EF 55 to 60% per cath in Jan 2012  . Hyperlipidemia   . Hypertension   . Carotid bruit   . Gout   . Obesity   . TIA (transient ischemic attack)   .  Chronic kidney disease     Stage 3  . Ischemic cardiomyopathy     with prior EF of 35%  . Anemia   . Proteinuria   . NSTEMI (non-ST elevated myocardial infarction) Jan 2012    with PCI to RCA and DX per Dr. Excell Seltzer  . Carotid artery occlusion   . Stroke 2013    ?  mini    . DVT (deep venous thrombosis)    Past Surgical History  Procedure Laterality Date  . Appendectomy    . Tonsillectomy    . Left ankle fusion    . Distal rca  04/05/2010    stent  . Fracture surgery Right 2005    bilateral ankles   . Abdominal aortagram N/A 01/17/2014    Procedure: ABDOMINAL AORTAGRAM;  Surgeon: Sherren Kerns, MD;  Location: [facility] CATH LAB;  Service: Cardiovascular;  Laterality: N/A;   Social History:   reports that she quit smoking about 46 years ago. She has never used smokeless tobacco. She reports that she does not drink alcohol or use illicit drugs.  Family History  Problem Relation Age of Onset  . Heart disease Neg Hx     both parents were murdered    Medications: Patient's Medications  New Prescriptions   No medications on file  Previous Medications   ASPIRIN 81 MG TABLET    Take 81  mg by mouth daily. For heart health   CLONIDINE (CATAPRES) 0.1 MG TABLET    Take 1 tablet (0.1 mg total) by mouth at bedtime.   FERROUS SULFATE 325 (65 FE) MG TABLET    Take 1 tablet (325 mg total) by mouth 2 (two) times daily with a meal.   FUROSEMIDE (LASIX) 20 MG TABLET    Take 1 tablet (20 mg total) by mouth daily.   LINAGLIPTIN (TRADJENTA) 5 MG TABS TABLET    Take 1 tablet (5 mg total) by mouth daily.   NITROGLYCERIN (NITROSTAT) 0.4 MG SL TABLET    Dissolve one tablet under tongue as needed for chest pain. May repeat as needed every 5 minutes up to 3 doses. Seek medical attention if pain is not relieved after 3 doses   OMEGA-3 ACID ETHYL ESTERS (LOVAZA) 1 G CAPSULE    TAKE 2 CAPSULES BY MOUTH EVERY NIGHT AT BEDTIME FOR 30 DAYS   POLYETHYLENE GLYCOL POWDER (GLYCOLAX/MIRALAX) POWDER    Take 17 g by  mouth daily. Daily as needed for bowels   TIMOLOL (TIMOPTIC) 0.5 % OPHTHALMIC SOLUTION    Place 1 drop into the left eye daily.    TRIMETHOPRIM-POLYMYXIN B (POLYTRIM) OPHTHALMIC SOLUTION    Place 1 drop into both eyes 2 (two) times daily.    VYTORIN 10-20 MG PER TABLET    TAKE 1/2 TABLET BY MOUTH EVERY NIGHT AT BEDTIME FOR 30 DAYS   WELCHOL 625 MG TABLET    TAKE 1 TABLET BY MOUTH TWICE DAILY WITH MEALS  Modified Medications   Modified Medication Previous Medication   CARVEDILOL (COREG) 3.125 MG TABLET carvedilol (COREG) 3.125 MG tablet      Take 1 tablet (3.125 mg total) by mouth 2 (two) times daily with a meal.    Take 1 tablet (3.125 mg total) by mouth daily.  Discontinued Medications   No medications on file     Physical Exam:  Filed Vitals:   06/26/14 1450  BP: 128/72  Pulse: 67  Temp: 97.5 F (36.4 C)  TempSrc: Oral  Height: 5' (1.524 m)  Weight: 147 lb (66.679 kg)  SpO2: 92%    Physical Exam  Constitutional: She is oriented to person, place, and time. She appears well-developed and well-nourished. No distress.  HENT:  Head: Normocephalic and atraumatic.  Mouth/Throat: Oropharynx is clear and moist. No oropharyngeal exudate.  Eyes: Conjunctivae are normal. Pupils are equal, round, and reactive to light.  Neck: Normal range of motion. Neck supple. No JVD present.  Cardiovascular: Normal rate, regular rhythm and normal heart sounds.   Pulmonary/Chest: Effort normal and breath sounds normal.  Abdominal: Soft. Bowel sounds are normal.  Musculoskeletal: She exhibits edema (2+ bilaterally). She exhibits no tenderness.  Neurological: She is alert and oriented to person, place, and time.  Skin: Skin is warm and dry. She is not diaphoretic.  Psychiatric: She has a normal mood and affect.    Labs reviewed: Basic Metabolic Panel:  Recent Labs  16/10/96 1105 05/01/14 1041  06/08/14 0708 06/09/14 0645 06/16/14 1519  NA 140  --   < > 141 141 144  K 4.8  --   < > 4.5 4.5  5.6*  CL 107  --   < > 109 109 107  CO2 26  --   < > GLUCOSE 146*  --   < > 124* 138* 149*  BUN 31*  --   < > 33* 31* 32*  CREATININE 1.25*  --   < >  1.51* 1.44* 1.63*  CALCIUM 8.8  --   < > 8.2* 8.4 8.9  TSH 5.43* 4.25  --   --   --   --   < > = values in this interval not displayed. Liver Function Tests:  Recent Labs  12/29/13 2139  01/18/14 0456 02/10/14 1212 06/03/14 1838  AST 16  < > 17 15 18   ALT 9  < > 7 10 10   ALKPHOS 63  < > 64 75 67  BILITOT 0.3  < > 0.5 0.3 0.4  PROT 6.1  < > 5.6* 6.5 5.5*  ALBUMIN 3.0*  --  2.7*  --  2.8*  < > = values in this interval not displayed. No results for input(s): LIPASE, AMYLASE in the last 8760 hours. No results for input(s): AMMONIA in the last 8760 hours. CBC:  Recent Labs  02/10/14 1212  06/03/14 1838  06/08/14 0708 06/09/14 0645 06/16/14 1519  WBC 5.1  < > 6.8  < > 4.7 4.8 7.5  NEUTROABS 2.8  --  5.4  --   --   --  4.4  HGB 10.7*  < > 11.5*  < > 9.8* 10.1* 10.8*  HCT 34.6  < > 35.7*  < > 30.8* 31.5* 33.5*  MCV 96  < > 91.3  < > 91.9 92.4 92  PLT 218  < > 145*  < > 168 197 267  < > = values in this interval not displayed. Lipid Panel:  Recent Labs  02/10/14 1212  CHOL 127  HDL 43  LDLCALC 54  TRIG 151*  CHOLHDL 3.0   TSH:  Recent Labs  04/02/14 1105 05/01/14 1041  TSH 5.43* 4.25   A1C: Lab Results  Component Value Date   HGBA1C 8.0* 06/05/2014     Assessment/Plan 1. Chronic kidney disease, stage III (moderate) -follow up Basic metabolic panel -worsening BUN/CR on last labs with elevate K -pt reports she is still eating foods high in potassium but has been taking lasix  2. DM (diabetes mellitus), type 2 with peripheral vascular complications -to start Tradjenta once delivered   3. Constipation, unspecified constipation type -controlled on home bowel regimen   4. LE edema -unchanged, to cont LASIX 20 MG tablet at this time  5. Anemia, iron deficiency -cont  ferrous sulfate 325 (65  FE) MG tablet PO BID -will follow up iron studies at next visit  6. Adnexal cyst Benign cyst per US, follow up in 1 year

## 2014-06-26 NOTE — Patient Instructions (Signed)
We will be in touch with lab results  Cont lasix and iron supplements Start tradjenta when delivered  Follow up in 6-8 weeks

## 2014-06-27 ENCOUNTER — Encounter (INDEPENDENT_AMBULATORY_CARE_PROVIDER_SITE_OTHER): Payer: Medicare Other | Admitting: Ophthalmology

## 2014-06-27 LAB — BASIC METABOLIC PANEL
BUN/Creatinine Ratio: 31 — ABNORMAL HIGH (ref 11–26)
BUN: 41 mg/dL — ABNORMAL HIGH (ref 8–27)
CALCIUM: 8.3 mg/dL — AB (ref 8.7–10.3)
CHLORIDE: 106 mmol/L (ref 97–108)
CO2: 22 mmol/L (ref 18–29)
Creatinine, Ser: 1.32 mg/dL — ABNORMAL HIGH (ref 0.57–1.00)
GFR calc Af Amer: 43 mL/min/{1.73_m2} — ABNORMAL LOW (ref >59)
GFR calc non Af Amer: 37 mL/min/{1.73_m2} — ABNORMAL LOW (ref >59)
Glucose: 189 mg/dL — ABNORMAL HIGH (ref 65–99)
Potassium: 4.9 mmol/L (ref 3.5–5.2)
Sodium: 144 mmol/L (ref 134–144)

## 2014-07-03 ENCOUNTER — Ambulatory Visit: Payer: 59 | Admitting: Vascular Surgery

## 2014-07-09 ENCOUNTER — Encounter (INDEPENDENT_AMBULATORY_CARE_PROVIDER_SITE_OTHER): Payer: Medicare Other | Admitting: Ophthalmology

## 2014-07-09 ENCOUNTER — Ambulatory Visit: Payer: 59 | Admitting: Nurse Practitioner

## 2014-07-09 DIAGNOSIS — E11331 Type 2 diabetes mellitus with moderate nonproliferative diabetic retinopathy with macular edema: Secondary | ICD-10-CM

## 2014-07-09 DIAGNOSIS — E11339 Type 2 diabetes mellitus with moderate nonproliferative diabetic retinopathy without macular edema: Secondary | ICD-10-CM

## 2014-07-09 DIAGNOSIS — H35033 Hypertensive retinopathy, bilateral: Secondary | ICD-10-CM

## 2014-07-09 DIAGNOSIS — I10 Essential (primary) hypertension: Secondary | ICD-10-CM | POA: Diagnosis not present

## 2014-07-09 DIAGNOSIS — E11311 Type 2 diabetes mellitus with unspecified diabetic retinopathy with macular edema: Secondary | ICD-10-CM

## 2014-07-09 DIAGNOSIS — H43813 Vitreous degeneration, bilateral: Secondary | ICD-10-CM

## 2014-07-10 ENCOUNTER — Ambulatory Visit: Payer: PRIVATE HEALTH INSURANCE | Admitting: Vascular Surgery

## 2014-07-10 ENCOUNTER — Encounter (HOSPITAL_COMMUNITY): Payer: PRIVATE HEALTH INSURANCE

## 2014-07-14 ENCOUNTER — Ambulatory Visit: Payer: Medicare Other | Admitting: Nurse Practitioner

## 2014-07-18 ENCOUNTER — Encounter (INDEPENDENT_AMBULATORY_CARE_PROVIDER_SITE_OTHER): Payer: Medicare Other | Admitting: Ophthalmology

## 2014-07-25 ENCOUNTER — Other Ambulatory Visit: Payer: Self-pay | Admitting: *Deleted

## 2014-07-25 MED ORDER — NITROGLYCERIN 0.4 MG SL SUBL: SUBLINGUAL_TABLET | SUBLINGUAL | Status: DC

## 2014-07-25 NOTE — Telephone Encounter (Signed)
[facility] 

## 2014-08-07 ENCOUNTER — Ambulatory Visit (INDEPENDENT_AMBULATORY_CARE_PROVIDER_SITE_OTHER): Payer: Medicare Other | Admitting: Nurse Practitioner

## 2014-08-07 ENCOUNTER — Encounter: Payer: Self-pay | Admitting: Nurse Practitioner

## 2014-08-07 VITALS — BP 128/70 | HR 62 | Temp 97.7°F | Ht 60.0 in | Wt 149.0 lb

## 2014-08-07 DIAGNOSIS — L97319 Non-pressure chronic ulcer of right ankle with unspecified severity: Secondary | ICD-10-CM

## 2014-08-07 DIAGNOSIS — E1151 Type 2 diabetes mellitus with diabetic peripheral angiopathy without gangrene: Secondary | ICD-10-CM

## 2014-08-07 DIAGNOSIS — K5900 Constipation, unspecified: Secondary | ICD-10-CM

## 2014-08-07 DIAGNOSIS — D509 Iron deficiency anemia, unspecified: Secondary | ICD-10-CM

## 2014-08-07 DIAGNOSIS — E1159 Type 2 diabetes mellitus with other circulatory complications: Secondary | ICD-10-CM | POA: Diagnosis not present

## 2014-08-07 DIAGNOSIS — R609 Edema, unspecified: Secondary | ICD-10-CM | POA: Diagnosis not present

## 2014-08-07 MED ORDER — GLUCOSE BLOOD VI STRP: ORAL_STRIP | Status: DC

## 2014-08-07 MED ORDER — ONETOUCH DELICA LANCETS FINE MISC: Status: DC

## 2014-08-07 MED ORDER — LINACLOTIDE 145 MCG PO CAPS: 145.0000 ug | ORAL_CAPSULE | Freq: Every day | ORAL | Status: DC

## 2014-08-07 MED ORDER — ONETOUCH ULTRA SYSTEM W/DEVICE KIT: PACK | Status: DC

## 2014-08-07 NOTE — Progress Notes (Signed)
Patient ID: [name], female   DOB: 11-Jun-1927, 81 y.o.   MRN: [id number]    PCP: Sharon Seller, NP  Allergies  Allergen Reactions  . Percocet [Oxycodone-Acetaminophen] Other (See Comments)  . Sulfa Antibiotics Other (See Comments)    Tongue swells  . Tradjenta [Linagliptin]     Low back ache, resolved once medication stopped   . Penicillins Hives    Tolerates Ceftriaxone    Chief Complaint  Patient presents with  . Medical Management of Chronic Issues    6 week follow-up on anemia, kidney disease, DM, and LE edema. Here with daughter [name])  . Blister    Patient c/o blisters on right lower leg   . Medication Management    Patient stopped diabetic medication, caused a low back ache     HPI: Patient is a 81 y.o. female seen in the office today for routine follow up.  Pt with a pmh of diabetes, anemia, CKD, LE edema, venous ulcer.  Pt stopped tradjenta because it caused her a back ache Has been eating bars with high sugar conts to take lasix for LE edema, which has helped the tightness and pain in LE conts to take iron twice daily, no blood in stools, no constipation- but takes multiple medications and lifestyle changes to keep her bowels regular.  Small blister to right medial ankle, skin intact   Review of Systems:  Review of Systems  Constitutional: Negative for fever, chills, diaphoresis and fatigue.  Respiratory: Negative for cough and shortness of breath.   Cardiovascular: Positive for leg swelling. Negative for chest pain.  Gastrointestinal: Negative for abdominal pain, diarrhea and constipation.  Genitourinary: Negative for dysuria and urgency.  Musculoskeletal: Negative for myalgias.  Skin:       Original ulcer to right medial leg has healed, now with ulcer  Neurological: Negative for dizziness, weakness, light-headedness, numbness and headaches.  Psychiatric/Behavioral: The patient is not nervous/anxious.     Past Medical History  Diagnosis Date  .  Coronary artery disease     Remote PCI. Had BMS to RCA and DES stent to Diagonal in Jan. 2012   . Diabetes mellitus     long standing  . Diastolic heart failure     EF 55 to 60% per cath in Jan 2012  . Hyperlipidemia   . Hypertension   . Carotid bruit   . Gout   . Obesity   . TIA (transient ischemic attack)   . Chronic kidney disease     Stage 3  . Ischemic cardiomyopathy     with prior EF of 35%  . Anemia   . Proteinuria   . NSTEMI (non-ST elevated myocardial infarction) Jan 2012    with PCI to RCA and DX per Dr. Excell Seltzer  . Carotid artery occlusion   . Stroke 2013    ?  mini    . DVT (deep venous thrombosis)    Past Surgical History  Procedure Laterality Date  . Appendectomy    . Tonsillectomy    . Left ankle fusion    . Distal rca  04/05/2010    stent  . Fracture surgery Right 2005    bilateral ankles   . Abdominal aortagram N/A 01/17/2014    Procedure: ABDOMINAL AORTAGRAM;  Surgeon: Sherren Kerns, MD;  Location: [facility] CATH LAB;  Service: Cardiovascular;  Laterality: N/A;   Social History:   reports that she quit smoking about 46 years ago. She has never used smokeless tobacco. She  reports that she does not drink alcohol or use illicit drugs.  Family History  Problem Relation Age of Onset  . Heart disease Neg Hx     both parents were murdered    Medications: Patient's Medications  New Prescriptions   No medications on file  Previous Medications   ASPIRIN 81 MG TABLET    Take 81 mg by mouth daily. For heart health   CARVEDILOL (COREG) 3.125 MG TABLET    TAKE 1 TABLET BY MOUTH TWICE DAILY FOR BLOOD PRESSURE   CLONIDINE (CATAPRES) 0.1 MG TABLET    Take 1 tablet (0.1 mg total) by mouth at bedtime.   FERROUS SULFATE 325 (65 FE) MG TABLET    Take 1 tablet (325 mg total) by mouth 2 (two) times daily with a meal.   FUROSEMIDE (LASIX) 20 MG TABLET    Take 1 tablet (20 mg total) by mouth daily.   NITROGLYCERIN (NITROSTAT) 0.4 MG SL TABLET    Dissolve one tablet under  tongue as needed for chest pain. May repeat as needed every 5 minutes up to 3 doses. Seek medical attention if pain is not relieved after 3 doses   OMEGA-3 ACID ETHYL ESTERS (LOVAZA) 1 G CAPSULE    TAKE 2 CAPSULES BY MOUTH EVERY NIGHT AT BEDTIME FOR 30 DAYS   POLYETHYLENE GLYCOL POWDER (GLYCOLAX/MIRALAX) POWDER    Take 17 g by mouth daily. Daily as needed for bowels   TIMOLOL (TIMOPTIC) 0.5 % OPHTHALMIC SOLUTION    Place 1 drop into the left eye daily.    TRIMETHOPRIM-POLYMYXIN B (POLYTRIM) OPHTHALMIC SOLUTION    Place 1 drop into both eyes 2 (two) times daily.    VYTORIN 10-20 MG PER TABLET    TAKE 1/2 TABLET BY MOUTH EVERY NIGHT AT BEDTIME FOR 30 DAYS   WELCHOL 625 MG TABLET    TAKE 1 TABLET BY MOUTH TWICE DAILY WITH MEALS  Modified Medications   No medications on file  Discontinued Medications   CARVEDILOL (COREG) 3.125 MG TABLET    Take 1 tablet (3.125 mg total) by mouth 2 (two) times daily with a meal.   LINAGLIPTIN (TRADJENTA) 5 MG TABS TABLET    Take 1 tablet (5 mg total) by mouth daily.     Physical Exam:  Filed Vitals:   08/07/14 1028  BP: 128/70  Pulse: 62  Temp: 97.7 F (36.5 C)  TempSrc: Oral  Height: 5' (1.524 m)  Weight: 149 lb (67.586 kg)  SpO2: 96%    Physical Exam  Constitutional: She is oriented to person, place, and time. She appears well-developed and well-nourished. No distress.  HENT:  Head: Normocephalic and atraumatic.  Mouth/Throat: Oropharynx is clear and moist. No oropharyngeal exudate.  Eyes: Conjunctivae are normal. Pupils are equal, round, and reactive to light.  Neck: Normal range of motion. Neck supple. No JVD present.  Cardiovascular: Normal rate, regular rhythm and normal heart sounds.   Pulmonary/Chest: Effort normal and breath sounds normal.  Abdominal: Soft. Bowel sounds are normal.  Musculoskeletal: She exhibits edema (2+ bilaterally). She exhibits no tenderness.  Neurological: She is alert and oriented to person, place, and time.  Skin:  Skin is warm and dry. She is not diaphoretic.  0.5 x 0.5 cm blister to right medial leg close to ankle  Psychiatric: She has a normal mood and affect.    Labs reviewed: Basic Metabolic Panel:  Recent Labs  78/29/5601/13/16 1105 05/01/14 1041  06/09/14 0645 06/16/14 1519 06/26/14 1542  NA 140  --   < >  141 144 144  K 4.8  --   < > 4.5 5.6* 4.9  CL 107  --   < > 109 107 106  CO2 26  --   < > 26 23 22   GLUCOSE 146*  --   < > 138* 149* 189*  BUN 31*  --   < > 31* 32* 41*  CREATININE 1.25*  --   < > 1.44* 1.63* 1.32*  CALCIUM 8.8  --   < > 8.4 8.9 8.3*  TSH 5.43* 4.25  --   --   --   --   < > = values in this interval not displayed. Liver Function Tests:  Recent Labs  12/29/13 2139  01/18/14 0456 02/10/14 1212 06/03/14 1838  AST 16  < > 17 15 18   ALT 9  < > 7 10 10   ALKPHOS 63  < > 64 75 67  BILITOT 0.3  < > 0.5 0.3 0.4  PROT 6.1  < > 5.6* 6.5 5.5*  ALBUMIN 3.0*  --  2.7*  --  2.8*  < > = values in this interval not displayed. No results for input(s): LIPASE, AMYLASE in the last 8760 hours. No results for input(s): AMMONIA in the last 8760 hours. CBC:  Recent Labs  02/10/14 1212  06/03/14 1838  06/08/14 0708 06/09/14 0645 06/16/14 1519  WBC 5.1  < > 6.8  < > 4.7 4.8 7.5  NEUTROABS 2.8  --  5.4  --   --   --  4.4  HGB 10.7*  < > 11.5*  < > 9.8* 10.1* 10.8*  HCT 34.6  < > 35.7*  < > 30.8* 31.5* 33.5*  MCV 96  < > 91.3  < > 91.9 92.4 92  PLT 218  < > 145*  < > 168 197 267  < > = values in this interval not displayed. Lipid Panel:  Recent Labs  02/10/14 1212  CHOL 127  HDL 43  LDLCALC 54  TRIG 151*  CHOLHDL 3.0   TSH:  Recent Labs  04/02/14 1105 05/01/14 1041  TSH 5.43* 4.25   A1C: Lab Results  Component Value Date   HGBA1C 8.0* 06/05/2014     Assessment/Plan 1. DM (diabetes mellitus), type 2 with peripheral vascular complications -A1c at 8 in march not taking any medication but has made dietary changes, diabetic meter and supplies sent to  pharmacy, will have pt check blood sugars three times weekly prior to breakfast and bring log to visit in 1 month   2. Constipation, unspecified constipation type -on multiple medication to help with bowel regimen and dietary changes as well -will have pt stop current medications -linzess 145 mcg once daily   3. Anemia, iron deficiency -conts on twice daily iron, no signs of blood loss -will follow up lab work today - CBC with Differential - Ferritin - Iron and TIBC  4. Edema -has improved on lasix, will cont at this time and follow up blood work.  - Basic metabolic panel  5. Ulcer of right ankle -original ulcer has healed, now with small blister that pt is monitoring closely  Follow up in 1 month on diabetes and ulcer, will get A1c

## 2014-08-07 NOTE — Patient Instructions (Signed)
Glucose meter and supplies sent to pharmacy, please check blood sugar 3 times a week beofre breaksfast and record. Bring log to next visit  Cont lasix and iron for now.   Will start linzess 145 mcg by mouth daily with a meal

## 2014-08-08 LAB — CBC WITH DIFFERENTIAL/PLATELET
BASOS: 0 %
Basophils Absolute: 0 10*3/uL (ref 0.0–0.2)
EOS (ABSOLUTE): 0.1 10*3/uL (ref 0.0–0.4)
Eos: 2 %
HEMATOCRIT: 34.6 % (ref 34.0–46.6)
Hemoglobin: 11.2 g/dL (ref 11.1–15.9)
Immature Grans (Abs): 0 10*3/uL (ref 0.0–0.1)
Immature Granulocytes: 0 %
Lymphocytes Absolute: 1.7 10*3/uL (ref 0.7–3.1)
Lymphs: 34 %
MCH: 29.9 pg (ref 26.6–33.0)
MCHC: 32.4 g/dL (ref 31.5–35.7)
MCV: 93 fL (ref 79–97)
MONOS ABS: 0.3 10*3/uL (ref 0.1–0.9)
Monocytes: 7 %
NEUTROS ABS: 2.8 10*3/uL (ref 1.4–7.0)
Neutrophils: 57 %
Platelets: 173 10*3/uL (ref 150–379)
RBC: 3.74 x10E6/uL — ABNORMAL LOW (ref 3.77–5.28)
RDW: 13.6 % (ref 12.3–15.4)
WBC: 4.9 10*3/uL (ref 3.4–10.8)

## 2014-08-08 LAB — BASIC METABOLIC PANEL
BUN/Creatinine Ratio: 24 (ref 11–26)
BUN: 35 mg/dL — AB (ref 8–27)
CALCIUM: 8.6 mg/dL — AB (ref 8.7–10.3)
CHLORIDE: 108 mmol/L (ref 97–108)
CO2: 20 mmol/L (ref 18–29)
Creatinine, Ser: 1.44 mg/dL — ABNORMAL HIGH (ref 0.57–1.00)
GFR calc Af Amer: 38 mL/min/{1.73_m2} — ABNORMAL LOW (ref >59)
GFR calc non Af Amer: 33 mL/min/{1.73_m2} — ABNORMAL LOW (ref >59)
GLUCOSE: 128 mg/dL — AB (ref 65–99)
POTASSIUM: 4.7 mmol/L (ref 3.5–5.2)
SODIUM: 144 mmol/L (ref 134–144)

## 2014-08-08 LAB — FERRITIN: FERRITIN: 101 ng/mL (ref 15–150)

## 2014-08-08 LAB — IRON AND TIBC
IRON: 65 ug/dL (ref 27–139)
Iron Saturation: 27 % (ref 15–55)
TIBC: 241 ug/dL — AB (ref 250–450)
UIBC: 176 ug/dL (ref 118–369)

## 2014-08-21 ENCOUNTER — Other Ambulatory Visit: Payer: Self-pay | Admitting: Nurse Practitioner

## 2014-08-22 NOTE — Telephone Encounter (Signed)
Please contact the pt and inquire if she has been taking this medication.

## 2014-08-22 NOTE — Telephone Encounter (Signed)
Per patient

## 2014-09-04 ENCOUNTER — Encounter: Payer: Self-pay | Admitting: Nurse Practitioner

## 2014-09-04 ENCOUNTER — Ambulatory Visit (INDEPENDENT_AMBULATORY_CARE_PROVIDER_SITE_OTHER): Payer: Medicare Other | Admitting: Nurse Practitioner

## 2014-09-04 VITALS — BP 138/70 | HR 57 | Temp 98.3°F | Resp 18 | Ht 60.0 in | Wt 146.8 lb

## 2014-09-04 DIAGNOSIS — E1159 Type 2 diabetes mellitus with other circulatory complications: Secondary | ICD-10-CM | POA: Diagnosis not present

## 2014-09-04 DIAGNOSIS — I739 Peripheral vascular disease, unspecified: Secondary | ICD-10-CM | POA: Diagnosis not present

## 2014-09-04 DIAGNOSIS — L98499 Non-pressure chronic ulcer of skin of other sites with unspecified severity: Secondary | ICD-10-CM | POA: Diagnosis not present

## 2014-09-04 DIAGNOSIS — D509 Iron deficiency anemia, unspecified: Secondary | ICD-10-CM

## 2014-09-04 DIAGNOSIS — K5900 Constipation, unspecified: Secondary | ICD-10-CM | POA: Diagnosis not present

## 2014-09-04 DIAGNOSIS — I70209 Unspecified atherosclerosis of native arteries of extremities, unspecified extremity: Secondary | ICD-10-CM

## 2014-09-04 MED ORDER — FERROUS SULFATE 325 (65 FE) MG PO TABS: 325.0000 mg | ORAL_TABLET | Freq: Two times a day (BID) | ORAL | Status: DC

## 2014-09-04 MED ORDER — COLESEVELAM HCL 625 MG PO TABS: 625.0000 mg | ORAL_TABLET | Freq: Two times a day (BID) | ORAL | Status: DC

## 2014-09-04 MED ORDER — FUROSEMIDE 20 MG PO TABS: 20.0000 mg | ORAL_TABLET | Freq: Every day | ORAL | Status: DC

## 2014-09-04 MED ORDER — OMEGA-3-ACID ETHYL ESTERS 1 G PO CAPS: ORAL_CAPSULE | ORAL | Status: DC

## 2014-09-04 MED ORDER — EZETIMIBE-SIMVASTATIN 10-20 MG PO TABS: ORAL_TABLET | ORAL | Status: DC

## 2014-09-04 MED ORDER — CLONIDINE HCL 0.1 MG PO TABS: 0.1000 mg | ORAL_TABLET | Freq: Every day | ORAL | Status: DC

## 2014-09-04 MED ORDER — AMLODIPINE BESYLATE 5 MG PO TABS: 5.0000 mg | ORAL_TABLET | Freq: Every day | ORAL | Status: DC

## 2014-09-04 MED ORDER — CARVEDILOL 3.125 MG PO TABS: ORAL_TABLET | ORAL | Status: DC

## 2014-09-04 MED ORDER — LOSARTAN POTASSIUM 50 MG PO TABS: 50.0000 mg | ORAL_TABLET | Freq: Every day | ORAL | Status: DC

## 2014-09-04 NOTE — Progress Notes (Signed)
Patient ID: [name], female   DOB: 1928-01-03, 81 y.o.   MRN: [id number]    PCP: Lauree Chandler, NP  Allergies  Allergen Reactions  . Percocet [Oxycodone-Acetaminophen] Other (See Comments)  . Sulfa Antibiotics Other (See Comments)    Tongue swells  . Tradjenta [Linagliptin]     Low back ache, resolved once medication stopped   . Penicillins Hives    Tolerates Ceftriaxone    Chief Complaint  Patient presents with  . Medical Management of Chronic Issues     HPI: Patient is a 81 y.o. female seen in the office today for follow up on diabetes.  Pt with a pmh of diabetes, anemia, CKD, LE edema, venous ulcer.  Pt stopped tradjenta because it caused her a back ache but has made dietary modifications, pt reports diabetic meter never came in the mail from her pharmacy.   Constipation- pt not taking linzess or miralax, linzess did not work well. Went back to other regimen with stool softeners and much better results.   Blister to right ankles have popped open and have mostly healed. Pt has kept leg wrapped  Review of Systems:  Review of Systems  Constitutional: Negative for fever, chills, diaphoresis and fatigue.  Respiratory: Negative for cough and shortness of breath.   Cardiovascular: Positive for leg swelling. Negative for chest pain.  Gastrointestinal: Negative for abdominal pain, diarrhea and constipation.  Genitourinary: Negative for dysuria and urgency.  Musculoskeletal: Negative for myalgias.  Skin:       Original ulcer to right medial leg has healed, now with blisters that are healing   Neurological: Negative for dizziness, weakness, light-headedness, numbness and headaches.  Psychiatric/Behavioral: The patient is not nervous/anxious.     Past Medical History  Diagnosis Date  . Coronary artery disease     Remote PCI. Had BMS to RCA and DES stent to Diagonal in Jan. 2012   . Diabetes mellitus     long standing  . Diastolic heart failure     EF 55 to 60% per  cath in Jan 2012  . Hyperlipidemia   . Hypertension   . Carotid bruit   . Gout   . Obesity   . TIA (transient ischemic attack)   . Chronic kidney disease     Stage 3  . Ischemic cardiomyopathy     with prior EF of 35%  . Anemia   . Proteinuria   . NSTEMI (non-ST elevated myocardial infarction) Jan 2012    with PCI to RCA and DX per Dr. Burt Knack  . Carotid artery occlusion   . Stroke 2013    ?  mini    . DVT (deep venous thrombosis)    Past Surgical History  Procedure Laterality Date  . Appendectomy    . Tonsillectomy    . Left ankle fusion    . Distal rca  04/05/2010    stent  . Fracture surgery Right 2005    bilateral ankles   . Abdominal aortagram N/A 01/17/2014    Procedure: ABDOMINAL AORTAGRAM;  Surgeon: Elam Dutch, MD;  Location: [facility] CATH LAB;  Service: Cardiovascular;  Laterality: N/A;   Social History:   reports that she quit smoking about 46 years ago. She has never used smokeless tobacco. She reports that she does not drink alcohol or use illicit drugs.  Family History  Problem Relation Age of Onset  . Heart disease Neg Hx     both parents were murdered    Medications: Patient's  Medications  New Prescriptions   No medications on file  Previous Medications   AMLODIPINE (NORVASC) 5 MG TABLET    TAKE 1 TABLET BY MOUTH ONCE DAILY   ASPIRIN 81 MG TABLET    Take 81 mg by mouth daily. For heart health   BLOOD GLUCOSE MONITORING SUPPL (ONE TOUCH ULTRA SYSTEM KIT) W/DEVICE KIT    E11.59 Check blood sugar daily as directed   CARVEDILOL (COREG) 3.125 MG TABLET    TAKE 1 TABLET BY MOUTH TWICE DAILY FOR BLOOD PRESSURE   CLONIDINE (CATAPRES) 0.1 MG TABLET    TAKE 1 TABLET BY MOUTH EVERY DAY   FERROUS SULFATE 325 (65 FE) MG TABLET    Take 1 tablet (325 mg total) by mouth 2 (two) times daily with a meal.   FUROSEMIDE (LASIX) 20 MG TABLET    TAKE 1 TABLET BY MOUTH EVERY DAY   GLUCOSE BLOOD TEST STRIP    E11.59 Check blood sugar daily as directed   LOSARTAN (COZAAR) 50  MG TABLET    TAKE 1 TABLET BY MOUTH ONCE DAILY   LOTEMAX 0.5 % GEL    Place 1 drop into the left eye 2 (two) times daily.   NITROGLYCERIN (NITROSTAT) 0.4 MG SL TABLET    Dissolve one tablet under tongue as needed for chest pain. May repeat as needed every 5 minutes up to 3 doses. Seek medical attention if pain is not relieved after 3 doses   OMEGA-3 ACID ETHYL ESTERS (LOVAZA) 1 G CAPSULE    TAKE 2 CAPSULES BY MOUTH EVERY NIGHT AT BEDTIME FOR 30 DAYS   ONETOUCH DELICA LANCETS FINE MISC    E11.59 Check blood sugar daily as directed   PROLENSA 0.07 % SOLN    Place 1 drop into the left eye at bedtime.   TIMOLOL (TIMOPTIC) 0.5 % OPHTHALMIC SOLUTION    Place 1 drop into the left eye daily.    TRIMETHOPRIM-POLYMYXIN B (POLYTRIM) OPHTHALMIC SOLUTION    Place 1 drop into both eyes 2 (two) times daily.    VYTORIN 10-20 MG PER TABLET    TAKE 1/2 TABLET BY MOUTH EVERY NIGHT AT BEDTIME FOR 30 DAYS   WELCHOL 625 MG TABLET    TAKE 1 TABLET BY MOUTH TWICE DAILY WITH MEALS  Modified Medications   No medications on file  Discontinued Medications   CLONIDINE (CATAPRES) 0.1 MG TABLET    Take 1 tablet (0.1 mg total) by mouth at bedtime.   LINACLOTIDE (LINZESS) 145 MCG CAPS CAPSULE    Take 1 capsule (145 mcg total) by mouth daily.   POLYETHYLENE GLYCOL POWDER (GLYCOLAX/MIRALAX) POWDER    DISSOLVE 17 GRAMS (1 CAPFUL) INTO 8 OUNCES OF LIQUID AND DRINK EVERY DAY AS NEEDED FOR BOWELS   WELCHOL 625 MG TABLET    TAKE 1 TABLET BY MOUTH TWICE DAILY WITH MEALS     Physical Exam:  Filed Vitals:   09/04/14 1014  BP: 138/70  Pulse: 57  Temp: 98.3 F (36.8 C)  TempSrc: Oral  Resp: 18  Height: 5' (1.524 m)  Weight: 146 lb 12.8 oz (66.588 kg)  SpO2: 98%    Physical Exam  Constitutional: She is oriented to person, place, and time. She appears well-developed and well-nourished. No distress.  HENT:  Head: Normocephalic and atraumatic.  Mouth/Throat: Oropharynx is clear and moist. No oropharyngeal exudate.  Eyes:  Conjunctivae are normal. Pupils are equal, round, and reactive to light.  Neck: Normal range of motion. Neck supple. No JVD present.  Cardiovascular: Normal rate, regular rhythm and normal heart sounds.   Pulmonary/Chest: Effort normal and breath sounds normal.  Abdominal: Soft. Bowel sounds are normal.  Musculoskeletal: She exhibits edema (+1 bilaterally). She exhibits no tenderness.  Neurological: She is alert and oriented to person, place, and time.  Skin: Skin is warm and dry. She is not diaphoretic.  Psychiatric: She has a normal mood and affect.    Labs reviewed: Basic Metabolic Panel:  Recent Labs  04/02/14 1105 05/01/14 1041  06/16/14 1519 06/26/14 1542 08/07/14 1113  NA 140  --   < > 144 144 144  K 4.8  --   < > 5.6* 4.9 4.7  CL 107  --   < > 107 106 108  CO2 26  --   < > '23 22 20  ' GLUCOSE 146*  --   < > 149* 189* 128*  BUN 31*  --   < > 32* 41* 35*  CREATININE 1.25*  --   < > 1.63* 1.32* 1.44*  CALCIUM 8.8  --   < > 8.9 8.3* 8.6*  TSH 5.43* 4.25  --   --   --   --   < > = values in this interval not displayed. Liver Function Tests:  Recent Labs  12/29/13 2139  01/18/14 0456 02/10/14 1212 06/03/14 1838  AST 16  < > '17 15 18  ' ALT 9  < > '7 10 10  ' ALKPHOS 63  < > 64 75 67  BILITOT 0.3  < > 0.5 0.3 0.4  PROT 6.1  < > 5.6* 6.5 5.5*  ALBUMIN 3.0*  --  2.7*  --  2.8*  < > = values in this interval not displayed. No results for input(s): LIPASE, AMYLASE in the last 8760 hours. No results for input(s): AMMONIA in the last 8760 hours. CBC:  Recent Labs  06/03/14 1838  06/08/14 0708 06/09/14 0645 06/16/14 1519 08/07/14 1113  WBC 6.8  < > 4.7 4.8 7.5 4.9  NEUTROABS 5.4  --   --   --  4.4 2.8  HGB 11.5*  < > 9.8* 10.1* 10.8*  --   HCT 35.7*  < > 30.8* 31.5* 33.5* 34.6  MCV 91.3  < > 91.9 92.4 92  --   PLT 145*  < > 168 197 267  --   < > = values in this interval not displayed. Lipid Panel:  Recent Labs  02/10/14 1212  CHOL 127  HDL 43  LDLCALC 54    TRIG 151*  CHOLHDL 3.0   TSH:  Recent Labs  04/02/14 1105 05/01/14 1041  TSH 5.43* 4.25   A1C: Lab Results  Component Value Date   HGBA1C 8.0* 06/05/2014     Assessment/Plan  1. Constipation, unspecified constipation type linzess not effective, has gone back to softeners and previous regimen which has been effective.   2. Type 2 diabetes mellitus with other circulatory complications -cont dietary modifications, will follow up lab work. Pt plans to call pharmacy about glucose meter once she gets home. (order was sent last visit but pt never received)   - Hemoglobin D4K - Basic metabolic panel  3. Anemia, iron deficiency -conts on iron twice daily  - ferrous sulfate 325 (65 FE) MG tablet; Take 1 tablet (325 mg total) by mouth 2 (two) times daily with a meal. For anemia  Dispense: 60 tablet; Refill: 3  4. Atherosclerotic PVD with ulceration Blisters that have formed to right LE, but at this time  has improved. Pt using cream as needed and monitoring closely -will cont to monitor to call if worsens, may need follow up with wound center if ulcer reappears   Follow up in 3 months

## 2014-09-04 NOTE — Patient Instructions (Signed)
Follow up in 3 months, sooner if needed.

## 2014-09-05 ENCOUNTER — Encounter: Payer: Self-pay | Admitting: *Deleted

## 2014-09-05 LAB — BASIC METABOLIC PANEL
BUN/Creatinine Ratio: 26 (ref 11–26)
BUN: 42 mg/dL — AB (ref 8–27)
CALCIUM: 8.9 mg/dL (ref 8.7–10.3)
CO2: 24 mmol/L (ref 18–29)
Chloride: 107 mmol/L (ref 97–108)
Creatinine, Ser: 1.59 mg/dL — ABNORMAL HIGH (ref 0.57–1.00)
GFR calc Af Amer: 34 mL/min/{1.73_m2} — ABNORMAL LOW (ref >59)
GFR, EST NON AFRICAN AMERICAN: 29 mL/min/{1.73_m2} — AB (ref >59)
GLUCOSE: 146 mg/dL — AB (ref 65–99)
Potassium: 5.2 mmol/L (ref 3.5–5.2)
Sodium: 144 mmol/L (ref 134–144)

## 2014-09-05 LAB — HEMOGLOBIN A1C
Est. average glucose Bld gHb Est-mCnc: 171 mg/dL
Hgb A1c MFr Bld: 7.6 % — ABNORMAL HIGH (ref 4.8–5.6)

## 2014-09-15 ENCOUNTER — Other Ambulatory Visit: Payer: Self-pay

## 2014-09-16 ENCOUNTER — Telehealth: Payer: Self-pay

## 2014-09-16 NOTE — Telephone Encounter (Signed)
Phone call from pt.  Reported she has had blisters develop around right ankle.  Stated she recently saw her "regular doctor", and was told to continue to monitor the blisters.  Stated that she has swelling in her legs with (R) > (L).  Reported the right LE has been draining a clear fluid x 2 days.  Stated there had been "bumps" around right ankle that developed into blisters.  Stated one blister has opened-up and "is approx. size of my little finger."  Stated there is some redness around the open blister.  Stated the open area is "sore."  Denied fever/ chills.  Reported she is using Desitin oint. to the blistered areas, and wrapping her legs with ace wraps.  Requested an appt. with Dr. Darrick PennaFields.  Advised to continue to wrap her legs with the Ace wraps and to elevate her legs above level of heart several times/ day.  Advised a scheduler will call her back with an appt.

## 2014-09-17 NOTE — Telephone Encounter (Signed)
Spoke with pts daughter to schedule for 09/19/2014, dpm

## 2014-09-18 ENCOUNTER — Encounter: Payer: Self-pay | Admitting: Family

## 2014-09-19 ENCOUNTER — Encounter: Payer: Self-pay | Admitting: Family

## 2014-09-19 ENCOUNTER — Ambulatory Visit (INDEPENDENT_AMBULATORY_CARE_PROVIDER_SITE_OTHER): Payer: Medicare Other | Admitting: Family

## 2014-09-19 VITALS — BP 140/64 | HR 59 | Temp 97.4°F | Resp 14 | Ht 60.0 in | Wt 147.0 lb

## 2014-09-19 DIAGNOSIS — Z9889 Other specified postprocedural states: Secondary | ICD-10-CM | POA: Diagnosis not present

## 2014-09-19 DIAGNOSIS — Z95828 Presence of other vascular implants and grafts: Secondary | ICD-10-CM

## 2014-09-19 DIAGNOSIS — I878 Other specified disorders of veins: Secondary | ICD-10-CM | POA: Diagnosis not present

## 2014-09-19 DIAGNOSIS — I779 Disorder of arteries and arterioles, unspecified: Secondary | ICD-10-CM

## 2014-09-19 NOTE — Patient Instructions (Signed)
Venous Stasis or Chronic Venous Insufficiency Chronic venous insufficiency, also called venous stasis, is a condition that affects the veins in the legs. The condition prevents blood from being pumped through these veins effectively. Blood may no longer be pumped effectively from the legs back to the heart. This condition can range from mild to severe. With proper treatment, you should be able to continue with an active life. CAUSES  Chronic venous insufficiency occurs when the vein walls become stretched, weakened, or damaged or when valves within the vein are damaged. Some common causes of this include:  High blood pressure inside the veins (venous hypertension).  Increased blood pressure in the leg veins from long periods of sitting or standing.  A blood clot that blocks blood flow in a vein (deep vein thrombosis).  Inflammation of a superficial vein (phlebitis) that causes a blood clot to form. RISK FACTORS Various things can make you more likely to develop chronic venous insufficiency, including:  Family history of this condition.  Obesity.  Pregnancy.  Sedentary lifestyle.  Smoking.  Jobs requiring long periods of standing or sitting in one place.  Being a certain age. Women in their 40s and 50s and men in their 70s are more likely to develop this condition. SIGNS AND SYMPTOMS  Symptoms may include:   Varicose veins.  Skin breakdown or ulcers.  Reddened or discolored skin on the leg.  Brown, smooth, tight, and painful skin just above the ankle, usually on the inside surface (lipodermatosclerosis).  Swelling. DIAGNOSIS  To diagnose this condition, your health care provider will take a medical history and do a physical exam. The following tests may be ordered to confirm the diagnosis:  Duplex ultrasound--A procedure that produces a picture of a blood vessel and nearby organs and also provides information on blood flow through the blood vessel.  Plethysmography--A  procedure that tests blood flow.  A venogram, or venography--A procedure used to look at the veins using X-ray and dye. TREATMENT The goals of treatment are to help you return to an active life and to minimize pain or disability. Treatment will depend on the severity of the condition. Medical procedures may be needed for severe cases. Treatment options may include:   Use of compression stockings. These can help with symptoms and lower the chances of the problem getting worse, but they do not cure the problem.  Sclerotherapy--A procedure involving an injection of a material that "dissolves" the damaged veins. Other veins in the network of blood vessels take over the function of the damaged veins.  Surgery to remove the vein or cut off blood flow through the vein (vein stripping or laser ablation surgery).  Surgery to repair a valve. HOME CARE INSTRUCTIONS   Wear compression stockings as directed by your health care provider.  Only take over-the-counter or prescription medicines for pain, discomfort, or fever as directed by your health care provider.  Follow up with your health care provider as directed. SEEK MEDICAL CARE IF:   You have redness, swelling, or increasing pain in the affected area.  You see a red streak or line that extends up or down from the affected area.  You have a breakdown or loss of skin in the affected area, even if the breakdown is small.  You have an injury to the affected area. SEEK IMMEDIATE MEDICAL CARE IF:   You have an injury and open wound in the affected area.  Your pain is severe and does not improve with medicine.  You have   sudden numbness or weakness in the foot or ankle below the affected area, or you have trouble moving your foot or ankle.  You have a fever or persistent symptoms for more than 2-3 days.  You have a fever and your symptoms suddenly get worse. MAKE SURE YOU:   Understand these instructions.  Will watch your  condition.  Will get help right away if you are not doing well or get worse. Document Released: 07/11/2006 Document Revised: 12/26/2012 Document Reviewed: 11/12/2012 ExitCare Patient Information 2015 [employer]. This information is not intended to replace advice given to you by your health care provider. Make sure you discuss any questions you have with your health care provider.  Peripheral Vascular Disease Peripheral Vascular Disease (PVD), also called Peripheral Arterial Disease (PAD), is a circulation problem caused by cholesterol (atherosclerotic plaque) deposits in the arteries. PVD commonly occurs in the lower extremities (legs) but it can occur in other areas of the body, such as your arms. The cholesterol buildup in the arteries reduces blood flow which can cause pain and other serious problems. The presence of PVD can place a person at risk for Coronary Artery Disease (CAD).  CAUSES  Causes of PVD can be many. It is usually associated with more than one risk factor such as:   High Cholesterol.  Smoking.  Diabetes.  Lack of exercise or inactivity.  High blood pressure (hypertension).  Obesity.  Family history. SYMPTOMS   When the lower extremities are affected, patients with PVD may experience:  Leg pain with exertion or physical activity. This is called INTERMITTENT CLAUDICATION. This may present as cramping or numbness with physical activity. The location of the pain is associated with the level of blockage. For example, blockage at the abdominal level (distal abdominal aorta) may result in buttock or hip pain. Lower leg arterial blockage may result in calf pain.  As PVD becomes more severe, pain can develop with less physical activity.  In people with severe PVD, leg pain may occur at rest.  Other PVD signs and symptoms:  Leg numbness or weakness.  Coldness in the affected leg or foot, especially when compared to the other leg.  A change in leg  color.  Patients with significant PVD are more prone to ulcers or sores on toes, feet or legs. These may take longer to heal or may reoccur. The ulcers or sores can become infected.  If signs and symptoms of PVD are ignored, gangrene may occur. This can result in the loss of toes or loss of an entire limb.  Not all leg pain is related to PVD. Other medical conditions can cause leg pain such as:  Blood clots (embolism) or Deep Vein Thrombosis.  Inflammation of the blood vessels (vasculitis).  Spinal stenosis. DIAGNOSIS  Diagnosis of PVD can involve several different types of tests. These can include:  Pulse Volume Recording Method (PVR). This test is simple, painless and does not involve the use of X-rays. PVR involves measuring and comparing the blood pressure in the arms and legs. An ABI (Ankle-Brachial Index) is calculated. The normal ratio of blood pressures is 1. As this number becomes smaller, it indicates more severe disease.  < 0.95 - indicates significant narrowing in one or more leg vessels.  <0.8 - there will usually be pain in the foot, leg or buttock with exercise.  <0.4 - will usually have pain in the legs at rest.  <0.25 - usually indicates limb threatening PVD.  Doppler detection of pulses in the   legs. This test is painless and checks to see if you have a pulses in your legs/feet.  A dye or contrast material (a substance that highlights the blood vessels so they show up on x-ray) may be given to help your caregiver better see the arteries for the following tests. The dye is eliminated from your body by the kidney's. Your caregiver may order blood work to check your kidney function and other laboratory values before the following tests are performed:  Magnetic Resonance Angiography (MRA). An MRA is a picture study of the blood vessels and arteries. The MRA machine uses a large magnet to produce images of the blood vessels.  Computed Tomography Angiography (CTA). A CTA is  a specialized x-ray that looks at how the blood flows in your blood vessels. An IV may be inserted into your arm so contrast dye can be injected.  Angiogram. Is a procedure that uses x-rays to look at your blood vessels. This procedure is minimally invasive, meaning a small incision (cut) is made in your groin. A small tube (catheter) is then inserted into the artery of your groin. The catheter is guided to the blood vessel or artery your caregiver wants to examine. Contrast dye is injected into the catheter. X-rays are then taken of the blood vessel or artery. After the images are obtained, the catheter is taken out. TREATMENT  Treatment of PVD involves many interventions which may include:  Lifestyle changes:  Quitting smoking.  Exercise.  Following a low fat, low cholesterol diet.  Control of diabetes.  Foot care is very important to the PVD patient. Good foot care can help prevent infection.  Medication:  Cholesterol-lowering medicine.  Blood pressure medicine.  Anti-platelet drugs.  Certain medicines may reduce symptoms of Intermittent Claudication.  Interventional/Surgical options:  Angioplasty. An Angioplasty is a procedure that inflates a balloon in the blocked artery. This opens the blocked artery to improve blood flow.  Stent Implant. A wire mesh tube (stent) is placed in the artery. The stent expands and stays in place, allowing the artery to remain open.  Peripheral Bypass Surgery. This is a surgical procedure that reroutes the blood around a blocked artery to help improve blood flow. This type of procedure may be performed if Angioplasty or stent implants are not an option. SEEK IMMEDIATE MEDICAL CARE IF:   You develop pain or numbness in your arms or legs.  Your arm or leg turns cold, becomes blue in color.  You develop redness, warmth, swelling and pain in your arms or legs. MAKE SURE YOU:   Understand these instructions.  Will watch your  condition.  Will get help right away if you are not doing well or get worse. Document Released: 04/14/2004 Document Revised: 05/30/2011 Document Reviewed: 03/11/2008 ExitCare Patient Information 2015 [employer]. This information is not intended to replace advice given to you by your health care provider. Make sure you discuss any questions you have with your health care provider.  

## 2014-09-19 NOTE — Progress Notes (Signed)
Established Venous Insufficiency  History of Present Illness  [name] is a 81 y.o. (May 13, 1927) female patient of Dr. Oneida Alar who had been undergoing Unna boot therapy for a stasis ulcer on her right medial malleolus. Dr. Oneida Alar last saw pt in March 2016; at that time she had 4 weeks of Unna boot therapy. She stated she had no pain in the ulcer at that time . She has previously undergone bilateral common iliac stenting in October 2015. At the time of her arteriogram she had bilateral moderate superficial femoral artery stenoses. 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  She returns today with c/o blister at right ankle that started about 2 weeks ago; states her right lateral/anterior calf area was weeping fluid four days ago but there is no evidence of this now. She seems to want to know if she has been treating her venous insufficiency properly. She has been wrapping her lower legs with ace wraps after applying Desitin ointment to her right ankle. She expresses concern about the unkempt state of her house and said this has happened since her daughter lives with her.    Past Medical History  Diagnosis Date  . Coronary artery disease     Remote PCI. Had BMS to RCA and DES stent to Diagonal in Jan. 2012   . Diabetes mellitus     long standing  . Diastolic heart failure     EF 55 to 60% per cath in Jan 2012  . Hyperlipidemia   . Hypertension   . Carotid bruit   . Gout   . Obesity   . TIA (transient ischemic attack)   . Chronic kidney disease     Stage 3  . Ischemic cardiomyopathy     with prior EF of 35%  . Anemia   . Proteinuria   . NSTEMI (non-ST elevated myocardial infarction) Jan 2012    with PCI to RCA and DX per Dr. Burt Knack  . Carotid artery occlusion   . Stroke 2013    ?  mini    . DVT (deep venous thrombosis)     Social History History  Substance Use Topics  . Smoking status: Former Smoker    Quit date: 03/21/1968  . Smokeless tobacco: Never Used   . Alcohol Use: No    Family History Family History  Problem Relation Age of Onset  . Heart disease Neg Hx     both parents were murdered    Surgical History Past Surgical History  Procedure Laterality Date  . Appendectomy    . Tonsillectomy    . Left ankle fusion    . Distal rca  04/05/2010    stent  . Fracture surgery Right 2005    bilateral ankles   . Abdominal aortagram N/A 01/17/2014    Procedure: ABDOMINAL AORTAGRAM;  Surgeon: Elam Dutch, MD;  Location: [facility] CATH LAB;  Service: Cardiovascular;  Laterality: N/A;    Allergies  Allergen Reactions  . Percocet [Oxycodone-Acetaminophen] Other (See Comments)  . Sulfa Antibiotics Other (See Comments)    Tongue swells  . Tradjenta [Linagliptin]     Low back ache, resolved once medication stopped   . Penicillins Hives    Tolerates Ceftriaxone    Current Outpatient Prescriptions  Medication Sig Dispense Refill  . amLODipine (NORVASC) 5 MG tablet Take 1 tablet (5 mg total) by mouth daily. For high blood pressure 30 tablet 5  . aspirin 81 MG tablet Take 81 mg  by mouth daily. For heart health    . Blood Glucose Monitoring Suppl (ONE TOUCH ULTRA SYSTEM KIT) W/DEVICE KIT E11.59 Check blood sugar daily as directed 1 each 0  . carvedilol (COREG) 3.125 MG tablet TAKE 1 TABLET BY MOUTH TWICE DAILY FOR HIGH BLOOD PRESSURE 60 tablet 5  . cloNIDine (CATAPRES) 0.1 MG tablet Take 1 tablet (0.1 mg total) by mouth daily. For high blood pressure 30 tablet 4  . colesevelam (WELCHOL) 625 MG tablet Take 1 tablet (625 mg total) by mouth 2 (two) times daily with a meal. For cholesterol 30 tablet 5  . ezetimibe-simvastatin (VYTORIN) 10-20 MG per tablet TAKE 1/2 TABLET BY MOUTH EVERY NIGHT AT BEDTIME for high cholesterol 45 tablet 1  . ferrous sulfate 325 (65 FE) MG tablet Take 1 tablet (325 mg total) by mouth 2 (two) times daily with a meal. For anemia 60 tablet 3  . furosemide (LASIX) 20 MG tablet Take 1 tablet (20 mg total) by mouth daily.  For swelling 30 tablet 2  . glucose blood test strip E11.59 Check blood sugar daily as directed 100 each 3  . losartan (COZAAR) 50 MG tablet Take 1 tablet (50 mg total) by mouth daily. For high blood pressure 90 tablet 2  . LOTEMAX 0.5 % GEL Place 1 drop into the left eye 2 (two) times daily.    . nitroGLYCERIN (NITROSTAT) 0.4 MG SL tablet Dissolve one tablet under tongue as needed for chest pain. May repeat as needed every 5 minutes up to 3 doses. Seek medical attention if pain is not relieved after 3 doses 25 tablet 11  . omega-3 acid ethyl esters (LOVAZA) 1 G capsule TAKE 1 CAPSULE BY MOUTH EVERY MORNING AND 1 CAPSULE IN  EVENING 60 capsule 5  . ONETOUCH DELICA LANCETS FINE MISC E11.59 Check blood sugar daily as directed 100 each 3  . PROLENSA 0.07 % SOLN Place 1 drop into the left eye at bedtime.    . timolol (TIMOPTIC) 0.5 % ophthalmic solution Place 1 drop into the left eye daily.     Marland Kitchen trimethoprim-polymyxin b (POLYTRIM) ophthalmic solution Place 1 drop into both eyes 2 (two) times daily.      No current facility-administered medications for this visit.    REVIEW OF SYSTEMS: see HPI for pertinent positives and negatives   Physical Examination  Filed Vitals:   09/19/14 1237  BP: 140/64  Pulse: 59  Temp: 97.4 F (36.3 C)  TempSrc: Oral  Resp: 14  Height: 5' (1.524 m)  Weight: 147 lb (66.679 kg)  SpO2: 94%   Body mass index is 28.71 kg/(m^2).  General: The patient appears their stated age.   HEENT:  No gross abnormalities Pulmonary: Respirations are moist sounding, no cough, no dyspnea. Abdomen: Soft and non-tender. Musculoskeletal: There are no major deformities. She has trace bilateral pitting edema in both lower legs with a healed pink lesion at right medial malleolus that may have been a small open venous stasis ulcer, but is now healed. Small patch of leathery skin, no erythema, at lateral malleolus area.   Neurologic: No focal weakness or paresthesias are  detected, Skin: There are no ulcer or rashes noted, see Musculoskeletal. Psychiatric: The patient has normal affect. 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 No signs of ischemia in her feet or legs. Pedal pulses are not  palpable but are audible by Doppler (DP and PT)    Medical Decision Making  [name] is a 81 y.o. female who presents with: bilateral leg chronic venous insufficiency, no open wounds, evidence of healed small stasis ulcer, trace bilateral pitting edema. Pt had been wrapping her lower legs with ace wraps; states her right anterior/lateral calf area was weeping serous fluid about 4 days ago, however no evidence of this now.  She has previously undergone bilateral common iliac stenting in October 2015. At the time of her arteriogram she had bilateral moderate superficial femoral artery stenoses.  Pt states the unkempt condition of her house bothers her, indicates her daughter has caused this.  Based on the patient's vascular studies and examination, I have offered the patient: Home Health evaluation of venous stasis ulcers, changing lower legs compression dressings, evaluation for health needs, eg., physical therapy, social services, etc. Pt reluctantly agreed to allow Home Health to evaluate her legs, do dressing changes for a few visits, make recommendations for caring for her venous insufficiency, and assess for other health needs such as physical therapy. She also may need evaluation for social services and cognitive issues.  Right lower leg redressed with ABD pad, kerlex at ankle, and mild compression with ace wrap.  Face to face time with patient was 25 minutes. Over 50% of this time was spent on counseling and coordination of care.   Follow up in a month with NP for evaluation of legs, on a day that Dr.  Oneida Alar is here.             Thank you for allowing [place] to participate in this patient's care.  Cailynn Bodnar, Sharmon Leyden, RN, MSN, FNP-C [facility] Office: [phone]  09/19/2014, 12:57 PM  Clinic MD: Bridgett Larsson

## 2014-09-23 ENCOUNTER — Other Ambulatory Visit: Payer: Self-pay

## 2014-09-23 ENCOUNTER — Other Ambulatory Visit: Payer: Self-pay | Admitting: *Deleted

## 2014-09-23 DIAGNOSIS — L97909 Non-pressure chronic ulcer of unspecified part of unspecified lower leg with unspecified severity: Principal | ICD-10-CM

## 2014-09-23 DIAGNOSIS — I83009 Varicose veins of unspecified lower extremity with ulcer of unspecified site: Secondary | ICD-10-CM

## 2014-10-08 ENCOUNTER — Encounter (INDEPENDENT_AMBULATORY_CARE_PROVIDER_SITE_OTHER): Payer: Medicare Other | Admitting: Ophthalmology

## 2014-10-08 DIAGNOSIS — I10 Essential (primary) hypertension: Secondary | ICD-10-CM | POA: Diagnosis not present

## 2014-10-08 DIAGNOSIS — H43813 Vitreous degeneration, bilateral: Secondary | ICD-10-CM

## 2014-10-08 DIAGNOSIS — E11311 Type 2 diabetes mellitus with unspecified diabetic retinopathy with macular edema: Secondary | ICD-10-CM | POA: Diagnosis not present

## 2014-10-08 DIAGNOSIS — H35033 Hypertensive retinopathy, bilateral: Secondary | ICD-10-CM

## 2014-10-08 DIAGNOSIS — E11331 Type 2 diabetes mellitus with moderate nonproliferative diabetic retinopathy with macular edema: Secondary | ICD-10-CM

## 2014-10-16 ENCOUNTER — Telehealth: Payer: Self-pay | Admitting: *Deleted

## 2014-10-16 NOTE — Telephone Encounter (Signed)
Received fax form from [employer], Social Worker at [facility] Care #: [phone]. [facility] Referral Form filled out and given to Abbey Chatters to review and sign.  Needs to be faxed to [facility] at fax#: [phone]

## 2014-10-21 ENCOUNTER — Ambulatory Visit (INDEPENDENT_AMBULATORY_CARE_PROVIDER_SITE_OTHER): Payer: Medicare Other | Admitting: Nurse Practitioner

## 2014-10-21 ENCOUNTER — Encounter: Payer: Self-pay | Admitting: Family

## 2014-10-21 ENCOUNTER — Encounter: Payer: Self-pay | Admitting: Nurse Practitioner

## 2014-10-21 VITALS — BP 118/48 | HR 63 | Ht 61.0 in | Wt 144.4 lb

## 2014-10-21 DIAGNOSIS — I259 Chronic ischemic heart disease, unspecified: Secondary | ICD-10-CM

## 2014-10-21 DIAGNOSIS — R5382 Chronic fatigue, unspecified: Secondary | ICD-10-CM | POA: Diagnosis not present

## 2014-10-21 DIAGNOSIS — I10 Essential (primary) hypertension: Secondary | ICD-10-CM

## 2014-10-21 LAB — HEPATIC FUNCTION PANEL
ALT: 15 U/L (ref 0–35)
AST: 21 U/L (ref 0–37)
Albumin: 3.5 g/dL (ref 3.5–5.2)
Alkaline Phosphatase: 60 U/L (ref 39–117)
Bilirubin, Direct: 0.1 mg/dL (ref 0.0–0.3)
Total Bilirubin: 0.4 mg/dL (ref 0.2–1.2)
Total Protein: 5.9 g/dL — ABNORMAL LOW (ref 6.0–8.3)

## 2014-10-21 LAB — LIPID PANEL
Cholesterol: 135 mg/dL (ref 0–200)
HDL: 43.8 mg/dL (ref >39.00)
LDL Cholesterol: 62 mg/dL (ref 0–99)
NonHDL: 91.49
Total CHOL/HDL Ratio: 3
Triglycerides: 148 mg/dL (ref 0.0–149.0)
VLDL: 29.6 mg/dL (ref 0.0–40.0)

## 2014-10-21 NOTE — Patient Instructions (Addendum)
We will be checking the following labs today - Lipids and liver tests  Medication Instructions:    Continue with your current medicines.     Testing/Procedures To Be Arranged:  N/A  Follow-Up:   See me in 6 months    Other Special Instructions:   N/A  Call the [facility] office at [phone] if you have any questions, problems or concerns.

## 2014-10-21 NOTE — Progress Notes (Signed)
CARDIOLOGY OFFICE NOTE  Date:  10/21/2014    [name] Date of Birth: [birth year] Medical Record #[id number]  PCP:  Ashley Chandler, NP  Cardiologist:  [name]    Chief Complaint  Patient presents with  . Coronary Artery Disease    Follow up visit - seen for [name]    History of Present Illness: [name] is a 81 y.o. female who presents today for a follow up visit. This is a 4 month check. Seen for [name].   She has multiple issues which include CAD, remote PCI, BMS to the RCA and DES to the DX in January of 2012. She has had longstanding diabetes, HTN, HLD, diastolic heart failure with EF 55 to 60% per cath back in 2012 but has been as low as 35%, PVD (seeing Dr. Oneida Walters), gout, CKD and chronic anemia.   I have known [name] for almost 20 years - always seems to have an issue with her social situation, her medicines or her doctors in some fashion.   Seen back in November of 2014 by PCP for some ongoing issues with anxiety/BP. Has had lisinopril added back along with some Xanax/Celexa. Having personal issues with a live in man that she does not want to evict as well as family issues in general. She has always cared for many animals in and at her home and had lots of issues with family.   I saw her back in January of 2015. Medicines had been adjusted for her BP. Still with lots of social issues with her home situation. I saw her in May of 2015- she had seen Dr. Oneida Walters recently for a foot ulcer - managed conservatively. She ultimately underwent bilateral common iliac stenting on January 17, 2014. 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 She did have a postprocedure hematoma in the groin but this is now resolved. She has been seen at the wound center for intermittent debridement of her right medial malleolus ulcer. She was placed in  an Haematologist.   Seen in December of 2015 - off lots of her medicines. BP was up. Clonidine and Norvasc were restarted. She was on a lower dose of ARB. Last seen by me in January - she was doing better. Cardiac status stable.   She came for a follow up in March - she was febrile - T of 102. Sent on to the ER - treated for UTI and possible pneumonia. I last saw her in April. She had improved. Still with lots of issues regarding her medicines. Tried to get that straightened out again. Restarted her Lasix.  Comes in today. Here alone. Looks pretty good today. Cardiac status is ok. More medical issues going on. Blood sugar is high - tells me she was told to monitor it for 3 months. Having more blisters on her feet. She has been using Desitin but now has an ulcer - has an appointment with Dr. Oneida Walters soon. May need to go back to the wound clinic. Not eating too much but drinking Slim Fast - sugar went up and she stopped this. Says her sugar is now coming back down - she was previously on Victoza in the remote past. Says other than the ulcer, she feels pretty good.    Past Medical History  Diagnosis Date  . Coronary artery disease  Remote PCI. Had BMS to RCA and DES stent to Diagonal in Jan. 2012   . Diabetes mellitus     long standing  . Diastolic heart failure     EF 55 to 60% per cath in Jan 2012  . Hyperlipidemia   . Hypertension   . Carotid bruit   . Gout   . Obesity   . TIA (transient ischemic attack)   . Chronic kidney disease     Stage 3  . Ischemic cardiomyopathy     with prior EF of 35%  . Anemia   . Proteinuria   . NSTEMI (non-ST elevated myocardial infarction) Jan 2012    with PCI to RCA and DX per [name]  . Carotid artery occlusion   . Stroke 2013    ?  mini    . DVT (deep venous thrombosis)     Past Surgical History  Procedure Laterality Date  . Appendectomy    . Tonsillectomy    . Left ankle fusion    . Distal rca  04/05/2010    stent  . Fracture surgery Right  2005    bilateral ankles   . Abdominal aortagram N/A 01/17/2014    Procedure: ABDOMINAL AORTAGRAM;  Surgeon: Ashley Dutch, MD;  Location: [facility] CATH LAB;  Service: Cardiovascular;  Laterality: N/A;     Medications: Current Outpatient Prescriptions  Medication Sig Dispense Refill  . amLODipine (NORVASC) 5 MG tablet Take 1 tablet (5 mg total) by mouth daily. For high blood pressure 30 tablet 5  . aspirin 81 MG tablet Take 81 mg by mouth daily. For heart health    . Blood Glucose Monitoring Suppl (ONE TOUCH ULTRA SYSTEM KIT) W/DEVICE KIT E11.59 Check blood sugar daily as directed 1 each 0  . carvedilol (COREG) 3.125 MG tablet TAKE 1 TABLET BY MOUTH TWICE DAILY FOR HIGH BLOOD PRESSURE 60 tablet 5  . cloNIDine (CATAPRES) 0.1 MG tablet Take 1 tablet (0.1 mg total) by mouth daily. For high blood pressure 30 tablet 4  . colesevelam (WELCHOL) 625 MG tablet Take 1 tablet (625 mg total) by mouth 2 (two) times daily with a meal. For cholesterol 30 tablet 5  . ezetimibe-simvastatin (VYTORIN) 10-20 MG per tablet TAKE 1/2 TABLET BY MOUTH EVERY NIGHT AT BEDTIME for high cholesterol 45 tablet 1  . ferrous sulfate 325 (65 FE) MG tablet Take 1 tablet (325 mg total) by mouth 2 (two) times daily with a meal. For anemia 60 tablet 3  . furosemide (LASIX) 20 MG tablet Take 1 tablet (20 mg total) by mouth daily. For swelling 30 tablet 2  . glucose blood test strip E11.59 Check blood sugar daily as directed 100 each 3  . losartan (COZAAR) 50 MG tablet Take 1 tablet (50 mg total) by mouth daily. For high blood pressure 90 tablet 2  . LOTEMAX 0.5 % GEL Place 1 drop into the left eye 2 (two) times daily.    . nitroGLYCERIN (NITROSTAT) 0.4 MG SL tablet Dissolve one tablet under tongue as needed for chest pain. May repeat as needed every 5 minutes up to 3 doses. Seek medical attention if pain is not relieved after 3 doses 25 tablet 11  . omega-3 acid ethyl esters (LOVAZA) 1 G capsule TAKE 1 CAPSULE BY MOUTH EVERY MORNING  AND 1 CAPSULE IN  EVENING 60 capsule 5  . ONETOUCH DELICA LANCETS FINE MISC E11.59 Check blood sugar daily as directed 100 each 3  . PROLENSA 0.07 % SOLN Place  1 drop into the left eye at bedtime.    . timolol (TIMOPTIC) 0.5 % ophthalmic solution Place 1 drop into the left eye daily.     Marland Kitchen trimethoprim-polymyxin b (POLYTRIM) ophthalmic solution Place 1 drop into both eyes 2 (two) times daily.      No current facility-administered medications for this visit.    Allergies: Allergies  Allergen Reactions  . Percocet [Oxycodone-Acetaminophen] Other (See Comments)  . Sulfa Antibiotics Other (See Comments)    Tongue swells  . Tradjenta [Linagliptin]     Low back ache, resolved once medication stopped   . Penicillins Hives    Tolerates Ceftriaxone    Social History: The patient  reports that she quit smoking about 46 years ago. She has never used smokeless tobacco. She reports that she does not drink alcohol or use illicit drugs.   Family History: The patient's family history is negative for Heart disease. Her parents were murdered.   Review of Systems: Please see the history of present illness.   Otherwise, the review of systems is positive for none.   All other systems are reviewed and negative.   Physical Exam: VS:  BP 118/48 mmHg  Pulse 63  Ht '5\' 1"'  (1.549 m)  Wt 144 lb 6.4 oz (65.499 kg)  BMI 27.30 kg/m2  SpO2 94% .  BMI Body mass index is 27.3 kg/(m^2).  Wt Readings from Last 3 Encounters:  10/21/14 144 lb 6.4 oz (65.499 kg)  09/19/14 147 lb (66.679 kg)  09/04/14 146 lb 12.8 oz (66.588 kg)    General: Pleasant. Well developed, well nourished and in no acute distress.  HEENT: Normal. Neck: Supple, no JVD, carotid bruits, or masses noted.  Cardiac: Regular rate and rhythm. No murmurs, rubs, or gallops. No edema.  Respiratory:  Lungs are clear to auscultation bilaterally with normal work of breathing.  GI: Soft and nontender.  MS: No deformity or atrophy. Gait and ROM  intact. Skin: Warm and dry. Color is normal. She has less than a dime size ulcer on the right ankle.  Neuro:  Strength and sensation are intact and no gross focal deficits noted.  Psych: Alert, appropriate and with normal affect.   LABORATORY DATA:  EKG:  EKG is not ordered today.   Lab Results  Component Value Date   WBC 4.9 08/07/2014   HGB 10.8* 06/16/2014   HCT 34.6 08/07/2014   PLT 267 06/16/2014   GLUCOSE 146* 09/04/2014   CHOL 127 02/10/2014   TRIG 151* 02/10/2014   HDL 43 02/10/2014   LDLCALC 54 02/10/2014   ALT 10 06/03/2014   AST 18 06/03/2014   NA 144 09/04/2014   K 5.2 09/04/2014   CL 107 09/04/2014   CREATININE 1.59* 09/04/2014   BUN 42* 09/04/2014   CO2 24 09/04/2014   TSH 4.25 05/01/2014   INR 1.09 04/15/2010   HGBA1C 7.6* 09/04/2014    BNP (last 3 results)  Recent Labs  06/03/14 1838  BNP 530.3*    ProBNP (last 3 results) No results for input(s): PROBNP in the last 8760 hours.   Other Studies Reviewed Today:   Assessment/Plan: 1. HTN - BP looks good on her current regimen. I have not made any changes.   2. CAD - no symptoms reported - will continue with medical management   3. HLD - on statin - needs lipids updated.  4. PVD - followed by [facility] with Dr. Oneida Walters - seeing him tomorrow which will be good timing given her recurrence  of the foot ulcer  5. DM  6. Hyperkalemia - off ARB  7. Diastolic HF - she seems compensated to me. I have left her on current regimen.   8. Adenexal cyst - with simple cyst noted by ultrasound - followed by PCP - she has thought that she has had an "abdominal mass" - I do not see this.  9. Constipation  Overall situation remains quite tenuous.   Current medicines are reviewed with the patient today.  The patient does not have concerns regarding medicines other than what has been noted above.  The following changes have been made:  See above.  Labs/ tests ordered today include:   No orders of the defined  types were placed in this encounter.     Disposition:   FU with me in 6 months.   Patient is agreeable to this plan and will call if any problems develop in the interim.   Signed: Burtis Junes, RN, ANP-C 10/21/2014 11:32 AM  [facility] [address] [place], [place]  [address] Phone: [phone] Fax: [phone]

## 2014-10-22 ENCOUNTER — Ambulatory Visit: Payer: Medicare Other | Admitting: Nurse Practitioner

## 2014-10-23 ENCOUNTER — Encounter: Payer: Self-pay | Admitting: Family

## 2014-10-23 ENCOUNTER — Ambulatory Visit (INDEPENDENT_AMBULATORY_CARE_PROVIDER_SITE_OTHER): Payer: Medicare Other | Admitting: Family

## 2014-10-23 VITALS — BP 119/59 | HR 61 | Temp 97.4°F | Resp 16 | Ht 60.0 in | Wt 146.0 lb

## 2014-10-23 DIAGNOSIS — I83015 Varicose veins of right lower extremity with ulcer other part of foot: Secondary | ICD-10-CM

## 2014-10-23 DIAGNOSIS — E1159 Type 2 diabetes mellitus with other circulatory complications: Secondary | ICD-10-CM | POA: Diagnosis not present

## 2014-10-23 DIAGNOSIS — Z95828 Presence of other vascular implants and grafts: Secondary | ICD-10-CM

## 2014-10-23 DIAGNOSIS — I83011 Varicose veins of right lower extremity with ulcer of thigh: Secondary | ICD-10-CM

## 2014-10-23 DIAGNOSIS — I83012 Varicose veins of right lower extremity with ulcer of calf: Secondary | ICD-10-CM

## 2014-10-23 DIAGNOSIS — I83018 Varicose veins of right lower extremity with ulcer other part of lower leg: Secondary | ICD-10-CM

## 2014-10-23 DIAGNOSIS — Z9889 Other specified postprocedural states: Secondary | ICD-10-CM

## 2014-10-23 DIAGNOSIS — S80821A Blister (nonthermal), right lower leg, initial encounter: Secondary | ICD-10-CM | POA: Insufficient documentation

## 2014-10-23 DIAGNOSIS — I83014 Varicose veins of right lower extremity with ulcer of heel and midfoot: Secondary | ICD-10-CM

## 2014-10-23 DIAGNOSIS — I83019 Varicose veins of right lower extremity with ulcer of unspecified site: Secondary | ICD-10-CM

## 2014-10-23 DIAGNOSIS — IMO0001 Reserved for inherently not codable concepts without codable children: Secondary | ICD-10-CM

## 2014-10-23 DIAGNOSIS — I83013 Varicose veins of right lower extremity with ulcer of ankle: Secondary | ICD-10-CM

## 2014-10-23 DIAGNOSIS — E1151 Type 2 diabetes mellitus with diabetic peripheral angiopathy without gangrene: Secondary | ICD-10-CM

## 2014-10-23 NOTE — Progress Notes (Signed)
Established Venous Insufficiency  History of Present Illness  [name] is a 81 y.o. (1928-01-04) female patient of Dr. Oneida Alar who had been undergoing Unna boot therapy for a stasis ulcer on her right medial malleolus. Dr. Oneida Alar last saw pt in March 2016; at that time she had 4 weeks of Unna boot therapy. She stated she had no pain in the ulcer at that time . She has previously undergone bilateral common iliac stenting in October 2015. At the time of her arteriogram she had bilateral moderate superficial femoral artery stenoses. 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 She returns today for 4 weeks follow up for stasis ulcer evaluation.  When I saw pt in July 2016 I requested Home Health evaluation and treatment of venous stasis ulcers, changing lower legs compression dressings, evaluation for health needs, eg., physical therapy, social services, etc. Pt reluctantly agreed to allow Home Health to evaluate her legs, do dressing changes for a few visits, make recommendations for caring for her venous insufficiency, and assess for other health needs such as physical therapy. She also may need evaluation for social services and cognitive issues.   Pt states Home Health has been visiting and dressing her right medial malleolus ulcer which pt states is healing. She states a blister formed over this previously healed ulcer and ruptured but is since improving. She has been wearing open toed zip up knee high compression hose, appear to be 30-40 or 40-50 mm Hg.  She has DM and review of records indicates most recent A1C as 7.6.   Past Medical History  Diagnosis Date  . Coronary artery disease     Remote PCI. Had BMS to RCA and DES stent to Diagonal in Jan. 2012   . Diabetes mellitus     long standing  . Diastolic heart failure     EF 55 to 60% per cath in Jan 2012  . Hyperlipidemia   . Hypertension   . Carotid bruit   . Gout   . Obesity   . TIA (transient ischemic attack)     . Chronic kidney disease     Stage 3  . Ischemic cardiomyopathy     with prior EF of 35%  . Anemia   . Proteinuria   . NSTEMI (non-ST elevated myocardial infarction) Jan 2012    with PCI to RCA and DX per Dr. Burt Knack  . Carotid artery occlusion   . Stroke 2013    ?  mini    . DVT (deep venous thrombosis)     Social History History  Substance Use Topics  . Smoking status: Former Smoker    Quit date: 03/21/1968  . Smokeless tobacco: Never Used  . Alcohol Use: No    Family History Family History  Problem Relation Age of Onset  . Heart disease Neg Hx     both parents were murdered    Surgical History Past Surgical History  Procedure Laterality Date  . Appendectomy    . Tonsillectomy    . Left ankle fusion    . Distal rca  04/05/2010    stent  . Fracture surgery Right 2005    bilateral ankles   . Abdominal aortagram N/A 01/17/2014    Procedure: ABDOMINAL AORTAGRAM;  Surgeon: Elam Dutch, MD;  Location: [facility] CATH LAB;  Service: Cardiovascular;  Laterality: N/A;    Allergies  Allergen Reactions  . Percocet [Oxycodone-Acetaminophen] Other (See Comments)  . Sulfa Antibiotics Other (See  Comments)    Tongue swells  . Tradjenta [Linagliptin]     Low back ache, resolved once medication stopped   . Penicillins Hives    Tolerates Ceftriaxone    Current Outpatient Prescriptions  Medication Sig Dispense Refill  . amLODipine (NORVASC) 5 MG tablet Take 1 tablet (5 mg total) by mouth daily. For high blood pressure 30 tablet 5  . aspirin 81 MG tablet Take 81 mg by mouth daily. For heart health    . Blood Glucose Monitoring Suppl (ONE TOUCH ULTRA SYSTEM KIT) W/DEVICE KIT E11.59 Check blood sugar daily as directed 1 each 0  . carvedilol (COREG) 3.125 MG tablet TAKE 1 TABLET BY MOUTH TWICE DAILY FOR HIGH BLOOD PRESSURE 60 tablet 5  . cloNIDine (CATAPRES) 0.1 MG tablet Take 1 tablet (0.1 mg total) by mouth daily. For high blood pressure 30 tablet 4  . colesevelam (WELCHOL)  625 MG tablet Take 1 tablet (625 mg total) by mouth 2 (two) times daily with a meal. For cholesterol 30 tablet 5  . ezetimibe-simvastatin (VYTORIN) 10-20 MG per tablet TAKE 1/2 TABLET BY MOUTH EVERY NIGHT AT BEDTIME for high cholesterol 45 tablet 1  . ferrous sulfate 325 (65 FE) MG tablet Take 1 tablet (325 mg total) by mouth 2 (two) times daily with a meal. For anemia 60 tablet 3  . furosemide (LASIX) 20 MG tablet Take 1 tablet (20 mg total) by mouth daily. For swelling 30 tablet 2  . glucose blood test strip E11.59 Check blood sugar daily as directed 100 each 3  . losartan (COZAAR) 50 MG tablet Take 1 tablet (50 mg total) by mouth daily. For high blood pressure 90 tablet 2  . LOTEMAX 0.5 % GEL Place 1 drop into the left eye 2 (two) times daily.    . nitroGLYCERIN (NITROSTAT) 0.4 MG SL tablet Dissolve one tablet under tongue as needed for chest pain. May repeat as needed every 5 minutes up to 3 doses. Seek medical attention if pain is not relieved after 3 doses 25 tablet 11  . omega-3 acid ethyl esters (LOVAZA) 1 G capsule TAKE 1 CAPSULE BY MOUTH EVERY MORNING AND 1 CAPSULE IN  EVENING 60 capsule 5  . ONETOUCH DELICA LANCETS FINE MISC E11.59 Check blood sugar daily as directed 100 each 3  . timolol (TIMOPTIC) 0.5 % ophthalmic solution Place 1 drop into the left eye daily.     Marland Kitchen trimethoprim-polymyxin b (POLYTRIM) ophthalmic solution Place 1 drop into both eyes 2 (two) times daily.     Marland Kitchen PROLENSA 0.07 % SOLN Place 1 drop into the left eye at bedtime.     No current facility-administered medications for this visit.    REVIEW OF SYSTEMS: see HPI for pertinent positives and negatives   Physical Examination  Filed Vitals:   10/23/14 1501  BP: 119/59  Pulse: 61  Temp: 97.4 F (36.3 C)  TempSrc: Oral  Resp: 16  Height: 5' (1.524 m)  Weight: 146 lb (66.225 kg)  SpO2: 94%   Body mass index is 28.51 kg/(m^2).  General: The patient appears their stated age.  HEENT: No gross  abnormalities Pulmonary: Respirations are non labored, no cough, no dyspnea. Abdomen: Soft and non-tender. Musculoskeletal: There are no major deformities. She has trace bilateral pitting edema in both lower legs with a shallow moist pink lesion at right medial malleolus, 5 mm x 8 mm. Hemosiderin deposits at both lower legs.  Neurologic: No focal weakness or paresthesias are detected, Skin: see Musculoskeletal. Psychiatric:  The patient has normal affect. She seems more calm, without the anxiety she seemed to have at her visit in July 2016. Cardiovascular: There is a regular rate and rhythm. No signs of ischemia in her feet or legs. Pedal pulses are not palpable but right DP is dampened audible, right PT and peroneal are not audible by Doppler; left PT and DP are audible by Doppler.    Medical Decision Making  [name] is a 81 y.o. female who has a history of right lower leg recurrent venous stasis ulcer.  She had undergone 4 weeks unna boot therapy in the past with improvement. She allowed Home Health to perform dressing changes and seemed to appreciate their care. Pt states she notes improvement in her right medial malleolus ulcer. There is no erythema, no drainage. The ulcer is epidermal layer depth and measures 5 mm x 8 mm.   Based on the patient's vascular studies and examination, and after discussing with Dr. Oneida Alar, I have offered the patient: weekly unna boot x 4 in our office, follow up with me in a month for wound evaluation and ABI's. Dr. Oneida Alar indicated that her right leg revascularization option is sub optimal and should be reserved as a last measure.  Continue left lower leg compression stocking.  Thank you for allowing [place] to participate in this patient's care.  NICKEL, Sharmon Leyden, RN, MSN, FNP-C [facility] Office: [phone]  10/23/2014, 3:14 PM  Clinic MD: Oneida Alar

## 2014-10-25 ENCOUNTER — Other Ambulatory Visit: Payer: Self-pay | Admitting: Internal Medicine

## 2014-10-25 ENCOUNTER — Other Ambulatory Visit: Payer: Self-pay | Admitting: Cardiovascular Disease

## 2014-10-25 ENCOUNTER — Other Ambulatory Visit: Payer: Self-pay | Admitting: Nurse Practitioner

## 2014-10-27 ENCOUNTER — Other Ambulatory Visit: Payer: Self-pay | Admitting: *Deleted

## 2014-10-27 MED ORDER — NITROGLYCERIN 0.4 MG SL SUBL: SUBLINGUAL_TABLET | SUBLINGUAL | Status: DC

## 2014-10-27 NOTE — Telephone Encounter (Signed)
[facility] 

## 2014-10-27 NOTE — Addendum Note (Signed)
Addended by: Adria Dill L on: 10/27/2014 04:13 PM   Modules accepted: Orders

## 2014-10-28 ENCOUNTER — Encounter: Payer: Self-pay | Admitting: Family

## 2014-10-29 ENCOUNTER — Ambulatory Visit (INDEPENDENT_AMBULATORY_CARE_PROVIDER_SITE_OTHER): Payer: Medicare Other

## 2014-10-29 DIAGNOSIS — L98499 Non-pressure chronic ulcer of skin of other sites with unspecified severity: Principal | ICD-10-CM

## 2014-10-29 DIAGNOSIS — I739 Peripheral vascular disease, unspecified: Secondary | ICD-10-CM

## 2014-10-29 DIAGNOSIS — I70209 Unspecified atherosclerosis of native arteries of extremities, unspecified extremity: Secondary | ICD-10-CM

## 2014-10-29 NOTE — Progress Notes (Signed)
Cleansed right leg with [name].  Wound approximately 2 mm with minimal drainage.  Reapplied unna boot.  Patient to return in one (1) week.

## 2014-11-03 ENCOUNTER — Encounter: Payer: Self-pay | Admitting: Pharmacotherapy

## 2014-11-03 ENCOUNTER — Ambulatory Visit (INDEPENDENT_AMBULATORY_CARE_PROVIDER_SITE_OTHER): Payer: Medicare Other | Admitting: Pharmacotherapy

## 2014-11-03 VITALS — BP 138/76 | HR 67 | Temp 98.5°F | Resp 20 | Ht 60.0 in | Wt 143.0 lb

## 2014-11-03 DIAGNOSIS — I10 Essential (primary) hypertension: Secondary | ICD-10-CM

## 2014-11-03 DIAGNOSIS — N183 Chronic kidney disease, stage 3 unspecified: Secondary | ICD-10-CM

## 2014-11-03 DIAGNOSIS — E1159 Type 2 diabetes mellitus with other circulatory complications: Secondary | ICD-10-CM | POA: Diagnosis not present

## 2014-11-03 NOTE — Patient Instructions (Signed)
Keep checking blood glucose. Will evaluate need for medication in 6 weeks after a trial of lifestyle modification.

## 2014-11-03 NOTE — Progress Notes (Signed)
  Subjective:    [name] is a 81 y.o.white female who presents for follow-up of Type 2 diabetes mellitus.   Most recent A1C is 7.6% She did not tolerate Tradjenta - had low back pain that resolved when she discontinued to medication.  She does not recall taking the Tradjenta. [name] has agreed to a trial of lifestyle modifications to manage DM.  When she was initially diagnosed, she did have to take insuiln She would prefer to not take medication. Average BG:  160-170  She knows how to read food labels - look at "sugar".  She admits to ingesting more sugar than usual prior to the last labs. Not much exercise.  Does some for exercise. She does have an ulcer on her right ankle.  Seeing Dr. Oneida Alar for wound care.  He feels it is healing Wears corrective lenses.  Last eye exam a couple of weeks ago.  No problems noted. Some peripheral edema. Nocturia a few times each evening.   Review of Systems A comprehensive review of systems was negative except for: Eyes: positive for contacts/glasses Cardiovascular: positive for lower extremity edema Genitourinary: positive for nocturia    Objective:    BP 138/76 mmHg  Pulse 67  Temp(Src) 98.5 F (36.9 C) (Oral)  Resp 20  Ht 5' (1.524 m)  Wt 143 lb (64.864 kg)  BMI 27.93 kg/m2  SpO2 90%  General:  alert, cooperative and no distress  Oropharynx: normal findings: lips normal without lesions and gums healthy   Eyes:  negative findings: lids and lashes normal and conjunctivae and sclerae normal   Ears:  external ears normal        Lung: clear to auscultation bilaterally  Heart:  regular rate and rhythm     Extremities: edema lower extremities  Skin: dry     Neuro: mental status, speech normal, alert and oriented x3   Lab Review GLUCOSE (mg/dL)  Date Value  09/04/2014 146*  08/07/2014 128*  06/26/2014 189*   GLUCOSE, BLD (mg/dL)  Date Value  06/09/2014 138*  06/08/2014 124*  06/07/2014 147*   CO2 (mmol/L)  Date  Value  09/04/2014 24  08/07/2014 20  06/26/2014 22   BUN (mg/dL)  Date Value  09/04/2014 42*  08/07/2014 35*  06/26/2014 41*  06/09/2014 31*  06/08/2014 33*  06/07/2014 22   CREATININE, SER (mg/dL)  Date Value  09/04/2014 1.59*  08/07/2014 1.44*  06/26/2014 1.32*       Assessment:    Diabetes Mellitus type II, under fair control.  A1C goal is 7.5%.  She is at 7.6% BP at goal <140/90   Plan:    1.  Rx changes: none 2.  Will evaluate lifestyle modification in 6 weeks.  If no significant change, will need to initiate therapy for glucose control. 3.  Counseled on meal planning and portion control.  Provided written information to supplement education. 4.  Counseled on benefit of routine exercise.  Goal is 30-45 minutes 5 x week. 5.  BP at goal <140/90 6.  CrCl >30 - may need to consider metformin. 7.  RTC in 6 weeks.

## 2014-11-06 ENCOUNTER — Ambulatory Visit (INDEPENDENT_AMBULATORY_CARE_PROVIDER_SITE_OTHER): Payer: Medicare Other | Admitting: *Deleted

## 2014-11-06 DIAGNOSIS — I83014 Varicose veins of right lower extremity with ulcer of heel and midfoot: Secondary | ICD-10-CM | POA: Diagnosis not present

## 2014-11-06 DIAGNOSIS — I83018 Varicose veins of right lower extremity with ulcer other part of lower leg: Secondary | ICD-10-CM

## 2014-11-06 DIAGNOSIS — I83011 Varicose veins of right lower extremity with ulcer of thigh: Secondary | ICD-10-CM | POA: Diagnosis not present

## 2014-11-06 DIAGNOSIS — I83013 Varicose veins of right lower extremity with ulcer of ankle: Secondary | ICD-10-CM

## 2014-11-06 DIAGNOSIS — IMO0001 Reserved for inherently not codable concepts without codable children: Secondary | ICD-10-CM

## 2014-11-06 DIAGNOSIS — I83012 Varicose veins of right lower extremity with ulcer of calf: Secondary | ICD-10-CM

## 2014-11-06 DIAGNOSIS — I83019 Varicose veins of right lower extremity with ulcer of unspecified site: Secondary | ICD-10-CM

## 2014-11-06 DIAGNOSIS — I83015 Varicose veins of right lower extremity with ulcer other part of foot: Secondary | ICD-10-CM

## 2014-11-06 NOTE — Progress Notes (Signed)
Mrs. Tift here for [employer] change.  Removed [employer]  Small amount of drainage noted on [employer]  Cleansed right calf and foot with Carraklenz.  Small ulcer noted right lateral ankle 1cm X 1 cm.  Reapplied [employer] to right lower extremity.  Mrs. Heck aware of of appointment with Dr. Darrick Penna and Henriette Combs change on 11-13-2014.

## 2014-11-12 ENCOUNTER — Encounter: Payer: Self-pay | Admitting: Family

## 2014-11-13 ENCOUNTER — Ambulatory Visit (INDEPENDENT_AMBULATORY_CARE_PROVIDER_SITE_OTHER): Payer: Medicare Other | Admitting: Family

## 2014-11-13 ENCOUNTER — Encounter: Payer: Self-pay | Admitting: Family

## 2014-11-13 ENCOUNTER — Ambulatory Visit (HOSPITAL_COMMUNITY)
Admission: RE | Admit: 2014-11-13 | Discharge: 2014-11-13 | Disposition: A | Discharge status: Home / Self Care | Payer: Medicare Other | Source: Ambulatory Visit | Attending: Family | Admitting: Family

## 2014-11-13 VITALS — BP 200/77 | HR 67 | Temp 97.0°F | Ht 60.0 in | Wt 145.0 lb

## 2014-11-13 DIAGNOSIS — E1151 Type 2 diabetes mellitus with diabetic peripheral angiopathy without gangrene: Secondary | ICD-10-CM

## 2014-11-13 DIAGNOSIS — I83218 Varicose veins of right lower extremity with both ulcer of other part of lower extremity and inflammation: Secondary | ICD-10-CM

## 2014-11-13 DIAGNOSIS — Z95828 Presence of other vascular implants and grafts: Secondary | ICD-10-CM

## 2014-11-13 DIAGNOSIS — I83012 Varicose veins of right lower extremity with ulcer of calf: Secondary | ICD-10-CM | POA: Diagnosis not present

## 2014-11-13 DIAGNOSIS — IMO0001 Reserved for inherently not codable concepts without codable children: Secondary | ICD-10-CM

## 2014-11-13 DIAGNOSIS — I83011 Varicose veins of right lower extremity with ulcer of thigh: Secondary | ICD-10-CM | POA: Diagnosis not present

## 2014-11-13 DIAGNOSIS — I83212 Varicose veins of right lower extremity with both ulcer of calf and inflammation: Secondary | ICD-10-CM

## 2014-11-13 DIAGNOSIS — L97319 Non-pressure chronic ulcer of right ankle with unspecified severity: Secondary | ICD-10-CM | POA: Insufficient documentation

## 2014-11-13 DIAGNOSIS — I83211 Varicose veins of right lower extremity with both ulcer of thigh and inflammation: Secondary | ICD-10-CM | POA: Diagnosis not present

## 2014-11-13 DIAGNOSIS — I83019 Varicose veins of right lower extremity with ulcer of unspecified site: Secondary | ICD-10-CM

## 2014-11-13 DIAGNOSIS — I83014 Varicose veins of right lower extremity with ulcer of heel and midfoot: Secondary | ICD-10-CM

## 2014-11-13 DIAGNOSIS — I83018 Varicose veins of right lower extremity with ulcer other part of lower leg: Secondary | ICD-10-CM

## 2014-11-13 DIAGNOSIS — I83214 Varicose veins of right lower extremity with both ulcer of heel and midfoot and inflammation: Secondary | ICD-10-CM

## 2014-11-13 DIAGNOSIS — Z9889 Other specified postprocedural states: Secondary | ICD-10-CM | POA: Diagnosis not present

## 2014-11-13 DIAGNOSIS — E1159 Type 2 diabetes mellitus with other circulatory complications: Secondary | ICD-10-CM

## 2014-11-13 DIAGNOSIS — I83013 Varicose veins of right lower extremity with ulcer of ankle: Secondary | ICD-10-CM

## 2014-11-13 DIAGNOSIS — I83213 Varicose veins of right lower extremity with both ulcer of ankle and inflammation: Secondary | ICD-10-CM

## 2014-11-13 DIAGNOSIS — I83015 Varicose veins of right lower extremity with ulcer other part of foot: Secondary | ICD-10-CM

## 2014-11-13 DIAGNOSIS — I83219 Varicose veins of right lower extremity with both ulcer of unspecified site and inflammation: Secondary | ICD-10-CM

## 2014-11-13 DIAGNOSIS — I83215 Varicose veins of right lower extremity with both ulcer other part of foot and inflammation: Secondary | ICD-10-CM

## 2014-11-13 NOTE — Progress Notes (Signed)
[facility] [place] HISTORY AND PHYSICAL -PAD  History of Present Illness [name] is a 81 y.o. female patient of Dr. Oneida Alar who had been undergoing Unna boot therapy for a stasis ulcer on her right medial malleolus. Dr. Oneida Alar last saw pt in March 2016; at that time she had 4 weeks of Unna boot therapy. She stated she had no pain in the ulcer at that time . She has previously undergone bilateral common iliac stenting in October 2015. At the time of her arteriogram she had bilateral moderate superficial femoral artery stenoses. 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 She returns today for 4 weeks follow up for stasis ulcer evaluation.  When I saw pt in July 2016 I requested Home Health evaluation and treatment of venous stasis ulcers, changing lower legs compression dressings, evaluation for health needs, eg., physical therapy, social services, etc. Pt reluctantly agreed to allow Home Health to evaluate her legs, do dressing changes for a few visits, make recommendations for caring for her venous insufficiency, and assess for other health needs such as physical therapy. She also may need evaluation for social services and cognitive issues.   Pt states Home Health has been visiting and dressing her right medial malleolus ulcer which pt states is healing. She states a blister formed over this previously healed ulcer and ruptured but is since improving. She has been wearing open toed zip up knee high compression hose, appear to be 30-40 or 40-50 mm Hg.  She has DM and review of records indicates most recent A1C as 7.6. Pt states she has been doing leg exercises.   The patient denies New Medical or Surgical History. Pt states the unna boot compression was OK except for this last week it felt too tight.  Today's unna boot will be the third in a series of 4.  Pt Diabetic: Yes,  review of records indicates most recent A1C as 7.6. Pt smoker: former smoker, quit  1970  Pt meds include:  Statin :Yes  Betablocker: Yes  ASA: Yes  Other anticoagulants/antiplatelets: no   Past Medical History  Diagnosis Date  . Coronary artery disease     Remote PCI. Had BMS to RCA and DES stent to Diagonal in Jan. 2012   . Diabetes mellitus     long standing  . Diastolic heart failure     EF 55 to 60% per cath in Jan 2012  . Hyperlipidemia   . Hypertension   . Carotid bruit   . Gout   . Obesity   . TIA (transient ischemic attack)   . Chronic kidney disease     Stage 3  . Ischemic cardiomyopathy     with prior EF of 35%  . Anemia   . Proteinuria   . NSTEMI (non-ST elevated myocardial infarction) Jan 2012    with PCI to RCA and DX per Dr. Burt Knack  . Carotid artery occlusion   . Stroke 2013    ?  mini    . DVT (deep venous thrombosis)     Social History Social History  Substance Use Topics  . Smoking status: Former Smoker    Quit date: 03/21/1968  . Smokeless tobacco: Never Used  . Alcohol Use: No    Family History Family History  Problem Relation Age of Onset  . Heart disease Neg Hx     both parents were murdered    Past Surgical History  Procedure Laterality Date  .  Appendectomy    . Tonsillectomy    . Left ankle fusion    . Distal rca  04/05/2010    stent  . Fracture surgery Right 2005    bilateral ankles   . Abdominal aortagram N/A 01/17/2014    Procedure: ABDOMINAL AORTAGRAM;  Surgeon: Elam Dutch, MD;  Location[facility] C[facility] LAB;  Service: Cardiovascular;  Laterality: N/A;    Allergies  Allergen Reactions  . Percocet [Oxycodone-Acetaminophen] Other (See Comments)  . Sulfa Antibiotics Other (See Comments)    Tongue swells  . Tradjenta [Linagliptin]     Low back ache, resolved once medication stopped   . Penicillins Hives    Tolerates Ceftriaxone    Current Outpatient Prescriptions  Medication Sig Dispense Refill  . amLODipine (NORVASC) 5 MG tablet Take 1 tablet (5 mg total) by mouth daily. For high blood pressure  30 tablet 5  . aspirin 81 MG tablet Take 81 mg by mouth daily. For heart health    . Blood Glucose Monitoring Suppl (ONE TOUCH ULTRA SYSTEM KIT) W/DEVICE KIT E11.59 Check blood sugar daily as directed 1 each 0  . carvedilol (COREG) 3.125 MG tablet TAKE 1 TABLET BY MOUTH TWICE DAILY FOR HIGH BLOOD PRESSURE 60 tablet 5  . cloNIDine (CATAPRES) 0.1 MG tablet Take 1 tablet (0.1 mg total) by mouth daily. For high blood pressure 30 tablet 4  . colesevelam (WELCHOL) 625 MG tablet Take 1 tablet (625 mg total) by mouth 2 (two) times daily with a meal. For cholesterol 30 tablet 5  . ezetimibe-simvastatin (VYTORIN) 10-20 MG per tablet TAKE 1/2 TABLET BY MOUTH EVERY NIGHT AT BEDTIME for high cholesterol 45 tablet 1  . furosemide (LASIX) 20 MG tablet Take 1 tablet (20 mg total) by mouth daily. For swelling 30 tablet 2  . glucose blood test strip E11.59 Check blood sugar daily as directed 100 each 3  . losartan (COZAAR) 50 MG tablet Take 1 tablet (50 mg total) by mouth daily. For high blood pressure 90 tablet 2  . LOTEMAX 0.5 % GEL Place 1 drop into the left eye 2 (two) times daily.    . nitroGLYCERIN (NITROSTAT) 0.4 MG SL tablet Dissolve one tablet under tongue as needed for chest pain. May repeat as needed every 5 minutes up to 3 doses. Seek medical attention if pain is not relieved after 3 doses 25 tablet 11  . omega-3 acid ethyl esters (LOVAZA) 1 G capsule TAKE 1 CAPSULE BY MOUTH EVERY MORNING AND 1 CAPSULE IN  EVENING 60 capsule 5  . ONETOUCH DELICA LANCETS FINE MISC E11.59 Check blood sugar daily as directed 100 each 3  . timolol (TIMOPTIC) 0.5 % ophthalmic solution Place 1 drop into the left eye daily.     Marland Kitchen trimethoprim-polymyxin b (POLYTRIM) ophthalmic solution Place 1 drop into both eyes 2 (two) times daily.     . ferrous sulfate 325 (65 FE) MG tablet Take 1 tablet (325 mg total) by mouth 2 (two) times daily with a meal. For anemia (Patient not taking: Reported on 11/03/2014) 60 tablet 3   No current  facility-administered medications for this visit.    ROS: See HPI for pertinent positives and negatives.   Physical Examination  Filed Vitals:   11/13/14 1046 11/13/14 1050  BP: 195/76 200/77  Pulse: 67   Temp: 97 F (36.1 C)   TempSrc: Oral   Height: 5' (1.524 m)   Weight: 145 lb (65.772 kg)   SpO2: 96%    Body mass index  is 28.32 kg/(m^2).  General: The patient appears their stated age.  HEENT: No gross abnormalities Pulmonary: Respirations are non labored, no cough, no dyspnea. Abdomen: Soft and non-tender. Musculoskeletal: There are no major deformities. She has trace bilateral pitting edema in both lower legs with a shallow moist pink lesion at right medial malleolus, 5 mm x 8 mm. Hemosiderin deposits at both lower legs.  Neurologic: No focal weakness or paresthesias are detected, Skin: see Musculoskeletal. Psychiatric: The patient has normal affect. She seems more calm, without the anxiety she seemed to have at her visit in July 2016. Cardiovascular: There is a regular rate and rhythm. No signs of ischemia in her feet or legs except for healing right medial malleolus ulcer 5 mm x 7 mm. Pedal pulses are not palpable but right DP is dampened audible, right PT and peroneal are not audible by Doppler; left PT and DP are audible by Doppler  Non-Invasive Vascular Imaging: DATE: 11/13/2014 ABI (Date: 11/13/2014)  R: 0.42 (0.48, 03/06/14), DP: monophasic, PT: not detected, TBI: 0.26 (0.23)  L: 0.68 (0.59), DP: monophasic, PT: monophasic, TBI: 0.35 (0.27)    ASSESSMENT: [name] is a 81 y.o. female who has a history of right lower leg recurrent venous stasis ulcer. She has previously undergone bilateral common iliac stenting in October 2015. At the time of her arteriogram she had bilateral moderate superficial femoral artery stenoses. 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 Today's ABI's indicate stable severely decreased arterial perfusion in the right LE  and improved moderately decreased arterial perfusion in the left LE.   Pt advised to see her PCP ASAP re her elevated blood pressure; pt attributes this to being up all night worried about a sick and missing cat. Pt states she missed her morning dose of antihypertensive medication.  Her blood pressure on 10/23/14 was 119/59.  On 10/23/14 the right medial malleolus ulcer measured 5 mm x 8 mm. Today the ulcer measures 5 mm x 7 mm, and is granulating well, no erythema, no drainage.  Today is the third weekly unna boot in a series of 4.   PLAN:  Based on the patient's vascular studies and examination, pt will return to clinic on 11/26/14 and see Dr. Oneida Alar to evaluate progress of right ankle ulcer.  She will return in a week for nurse visit, weekly unna boot change.  I discussed in depth with the patient the nature of atherosclerosis, and emphasized the importance of maximal medical management including strict control of blood pressure, blood glucose, and lipid levels, obtaining regular exercise, and continued cessation of smoking.  The patient is aware that without maximal medical management the underlying atherosclerotic disease process will progress, limiting the benefit of any interventions.  The patient was given information about PAD including signs, symptoms, treatment, what symptoms should prompt the patient to seek immediate medical care, and risk reduction measures to take.  Clemon Chambers, RN, MSN, FNP-C [facility] Phone: [phone]  Clinic MD: Early  11/13/2014 11:22 AM

## 2014-11-19 ENCOUNTER — Encounter: Payer: Self-pay | Admitting: Vascular Surgery

## 2014-11-20 ENCOUNTER — Ambulatory Visit (INDEPENDENT_AMBULATORY_CARE_PROVIDER_SITE_OTHER): Payer: Medicare Other

## 2014-11-20 ENCOUNTER — Other Ambulatory Visit: Payer: Self-pay | Admitting: Family

## 2014-11-20 DIAGNOSIS — I83215 Varicose veins of right lower extremity with both ulcer other part of foot and inflammation: Secondary | ICD-10-CM

## 2014-11-20 DIAGNOSIS — I83214 Varicose veins of right lower extremity with both ulcer of heel and midfoot and inflammation: Secondary | ICD-10-CM | POA: Diagnosis not present

## 2014-11-20 DIAGNOSIS — I83218 Varicose veins of right lower extremity with both ulcer of other part of lower extremity and inflammation: Secondary | ICD-10-CM

## 2014-11-20 DIAGNOSIS — I83211 Varicose veins of right lower extremity with both ulcer of thigh and inflammation: Secondary | ICD-10-CM | POA: Diagnosis not present

## 2014-11-20 DIAGNOSIS — I83212 Varicose veins of right lower extremity with both ulcer of calf and inflammation: Secondary | ICD-10-CM

## 2014-11-20 DIAGNOSIS — I83213 Varicose veins of right lower extremity with both ulcer of ankle and inflammation: Secondary | ICD-10-CM | POA: Diagnosis not present

## 2014-11-20 DIAGNOSIS — I83013 Varicose veins of right lower extremity with ulcer of ankle: Secondary | ICD-10-CM

## 2014-11-20 DIAGNOSIS — L97319 Non-pressure chronic ulcer of right ankle with unspecified severity: Principal | ICD-10-CM

## 2014-11-20 DIAGNOSIS — I83219 Varicose veins of right lower extremity with both ulcer of unspecified site and inflammation: Secondary | ICD-10-CM

## 2014-11-20 MED ORDER — DOXYCYCLINE HYCLATE 100 MG PO CAPS
100.0000 mg | ORAL_CAPSULE | Freq: Two times a day (BID) | ORAL | Status: DC
Start: 2014-11-20 — End: 2014-12-04

## 2014-11-20 NOTE — Progress Notes (Unsigned)
[name] was seen today for an [employer] change of the Right medial ankle.   Right ankle wound has decreased in size, 3mm and healing well. New Unna Boot was applied to Right leg with no difficulty.   [name] presented today, LEFT medial lower leg had dry eschar and discoloration, 1-2 day duration.       Pt was seen by NP, S Nickel, Rx Doxycycline 100 mg #20 Bid for 10 days was sent to pt's. [facility] in [place], [place].  Patient will return Sept. 7, 2016 to see Dr. Darrick Penna for re-evaluation bilateral leg.  [name], RMA. AMT

## 2014-11-25 ENCOUNTER — Encounter: Payer: Self-pay | Admitting: Vascular Surgery

## 2014-11-26 ENCOUNTER — Encounter: Payer: Self-pay | Admitting: Vascular Surgery

## 2014-11-26 ENCOUNTER — Ambulatory Visit (INDEPENDENT_AMBULATORY_CARE_PROVIDER_SITE_OTHER): Payer: Medicare Other | Admitting: Vascular Surgery

## 2014-11-26 VITALS — BP 139/64 | HR 61 | Temp 97.1°F | Resp 16 | Ht 60.0 in | Wt 142.0 lb

## 2014-11-26 DIAGNOSIS — I83213 Varicose veins of right lower extremity with both ulcer of ankle and inflammation: Secondary | ICD-10-CM | POA: Diagnosis not present

## 2014-11-26 DIAGNOSIS — I83212 Varicose veins of right lower extremity with both ulcer of calf and inflammation: Secondary | ICD-10-CM

## 2014-11-26 DIAGNOSIS — I83215 Varicose veins of right lower extremity with both ulcer other part of foot and inflammation: Secondary | ICD-10-CM

## 2014-11-26 DIAGNOSIS — I83211 Varicose veins of right lower extremity with both ulcer of thigh and inflammation: Secondary | ICD-10-CM

## 2014-11-26 DIAGNOSIS — I83214 Varicose veins of right lower extremity with both ulcer of heel and midfoot and inflammation: Secondary | ICD-10-CM | POA: Diagnosis not present

## 2014-11-26 DIAGNOSIS — I83219 Varicose veins of right lower extremity with both ulcer of unspecified site and inflammation: Secondary | ICD-10-CM

## 2014-11-26 DIAGNOSIS — I83013 Varicose veins of right lower extremity with ulcer of ankle: Secondary | ICD-10-CM

## 2014-11-26 DIAGNOSIS — L97319 Non-pressure chronic ulcer of right ankle with unspecified severity: Principal | ICD-10-CM

## 2014-11-26 DIAGNOSIS — I83218 Varicose veins of right lower extremity with both ulcer of other part of lower extremity and inflammation: Secondary | ICD-10-CM

## 2014-11-26 NOTE — Progress Notes (Signed)
Patient is an 81 year old female who has been undergoing Unna boot therapy for a stasis ulcer on her right medial malleolus. She presents today after 4 weeks of Unna boot therapy for a similar ulcer. She states she has no pain in the ulcer. She occasionally takes a pain pill but rarely. She has previously undergone bilateral common iliac stenting in October 2015. At the time of her arteriogram she had bilateral moderate superficial femoral artery stenoses. 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 She has had multiple chronic exacerbations and remissions of this ulcer over the years   Review of systems: She denies any significant drainage from the wounds or fever. She denies chest pain or shortness of breath. She walks daily at [facility].   Physical exam:    Filed Vitals:   11/26/14 1022  BP: 139/64  Pulse: 61  Temp: 97.1 F (36.2 C)  TempSrc: Oral  Resp: 16  Height: 5' (1.524 m)  Weight: 142 lb (64.411 kg)  SpO2: 94%    Right lower extremity:10 x 10 mm ulcer right medial malleolus healthy granulation tissue at the base no surrounding erythema Thickened nails both feet. 2+ femoral pulses bilaterally, recently healed ulcer left posterior calf  Assessment: Healing stasis ulcer right lower extremity  Plan:  The ulcer is slowly healing. We will continue weekly Unna boot change for another 4 weeks. She will continue to wear a compression stocking on the left lower extremity to prevent new ulcerations. She will follow-up with me in 1 month to recheck her legs.    Fabienne Bruns, MD [facility] [place] Office: [phone] Pager: [phone]

## 2014-11-28 ENCOUNTER — Other Ambulatory Visit: Payer: Self-pay | Admitting: Internal Medicine

## 2014-12-02 ENCOUNTER — Encounter: Payer: Self-pay | Admitting: Vascular Surgery

## 2014-12-02 ENCOUNTER — Encounter (HOSPITAL_COMMUNITY): Payer: Self-pay | Admitting: *Deleted

## 2014-12-02 DIAGNOSIS — Z79899 Other long term (current) drug therapy: Secondary | ICD-10-CM | POA: Diagnosis not present

## 2014-12-02 DIAGNOSIS — Z8673 Personal history of transient ischemic attack (TIA), and cerebral infarction without residual deficits: Secondary | ICD-10-CM | POA: Diagnosis not present

## 2014-12-02 DIAGNOSIS — I129 Hypertensive chronic kidney disease with stage 1 through stage 4 chronic kidney disease, or unspecified chronic kidney disease: Secondary | ICD-10-CM | POA: Diagnosis not present

## 2014-12-02 DIAGNOSIS — E119 Type 2 diabetes mellitus without complications: Secondary | ICD-10-CM | POA: Diagnosis not present

## 2014-12-02 DIAGNOSIS — I5030 Unspecified diastolic (congestive) heart failure: Secondary | ICD-10-CM | POA: Diagnosis not present

## 2014-12-02 DIAGNOSIS — Z880 Allergy status to penicillin: Secondary | ICD-10-CM | POA: Diagnosis not present

## 2014-12-02 DIAGNOSIS — R531 Weakness: Secondary | ICD-10-CM | POA: Insufficient documentation

## 2014-12-02 DIAGNOSIS — R52 Pain, unspecified: Secondary | ICD-10-CM | POA: Diagnosis not present

## 2014-12-02 DIAGNOSIS — I2510 Atherosclerotic heart disease of native coronary artery without angina pectoris: Secondary | ICD-10-CM | POA: Diagnosis not present

## 2014-12-02 DIAGNOSIS — Z7982 Long term (current) use of aspirin: Secondary | ICD-10-CM | POA: Insufficient documentation

## 2014-12-02 DIAGNOSIS — E669 Obesity, unspecified: Secondary | ICD-10-CM | POA: Insufficient documentation

## 2014-12-02 DIAGNOSIS — N183 Chronic kidney disease, stage 3 (moderate): Secondary | ICD-10-CM | POA: Insufficient documentation

## 2014-12-02 DIAGNOSIS — M79605 Pain in left leg: Secondary | ICD-10-CM | POA: Diagnosis not present

## 2014-12-02 DIAGNOSIS — R200 Anesthesia of skin: Secondary | ICD-10-CM | POA: Insufficient documentation

## 2014-12-02 DIAGNOSIS — Z87891 Personal history of nicotine dependence: Secondary | ICD-10-CM | POA: Insufficient documentation

## 2014-12-02 DIAGNOSIS — Z86718 Personal history of other venous thrombosis and embolism: Secondary | ICD-10-CM | POA: Insufficient documentation

## 2014-12-02 DIAGNOSIS — D649 Anemia, unspecified: Secondary | ICD-10-CM | POA: Diagnosis not present

## 2014-12-02 NOTE — ED Notes (Signed)
Patient presents via EMS with c/o bilateral leg pain.  Patient is usually able to walk to the store and take care of herself. (lives with daughter).  When the pain started she was sitting in a chair and when she got up she thought it was a cramp.  Tried everything to get rid of the pain.  Then it went away and she sat down and just a few minutes latrer it started again.  She was hurting in the entire leg.  Severe pain lasted about 10 mins and told her daughter to call for help.  Denies pain at this time  EMS reported she was unsteady when they attempted to amb her

## 2014-12-03 ENCOUNTER — Emergency Department (HOSPITAL_COMMUNITY)
Admission: EM | Admit: 2014-12-03 | Discharge: 2014-12-03 | Disposition: A | Discharge status: Home / Self Care | Payer: Medicare Other | Attending: Emergency Medicine | Admitting: Emergency Medicine

## 2014-12-03 ENCOUNTER — Emergency Department (HOSPITAL_COMMUNITY): Payer: Medicare Other

## 2014-12-03 ENCOUNTER — Emergency Department (EMERGENCY_DEPARTMENT_HOSPITAL): Payer: Medicare Other

## 2014-12-03 DIAGNOSIS — R52 Pain, unspecified: Secondary | ICD-10-CM

## 2014-12-03 DIAGNOSIS — M79605 Pain in left leg: Secondary | ICD-10-CM | POA: Diagnosis not present

## 2014-12-03 DIAGNOSIS — R531 Weakness: Secondary | ICD-10-CM

## 2014-12-03 DIAGNOSIS — R29898 Other symptoms and signs involving the musculoskeletal system: Secondary | ICD-10-CM

## 2014-12-03 DIAGNOSIS — R200 Anesthesia of skin: Secondary | ICD-10-CM

## 2014-12-03 LAB — COMPREHENSIVE METABOLIC PANEL
ALBUMIN: 3.4 g/dL — AB (ref 3.5–5.0)
ALT: 13 U/L — AB (ref 14–54)
AST: 18 U/L (ref 15–41)
Alkaline Phosphatase: 58 U/L (ref 38–126)
Anion gap: 9 (ref 5–15)
BUN: 93 mg/dL — AB (ref 6–20)
CHLORIDE: 112 mmol/L — AB (ref 101–111)
CO2: 21 mmol/L — AB (ref 22–32)
CREATININE: 1.89 mg/dL — AB (ref 0.44–1.00)
Calcium: 9.1 mg/dL (ref 8.9–10.3)
GFR calc Af Amer: 27 mL/min — ABNORMAL LOW (ref >60)
GFR, EST NON AFRICAN AMERICAN: 23 mL/min — AB (ref >60)
Glucose, Bld: 125 mg/dL — ABNORMAL HIGH (ref 65–99)
POTASSIUM: 4.8 mmol/L (ref 3.5–5.1)
SODIUM: 142 mmol/L (ref 135–145)
Total Bilirubin: 0.5 mg/dL (ref 0.3–1.2)
Total Protein: 6 g/dL — ABNORMAL LOW (ref 6.5–8.1)

## 2014-12-03 LAB — CBC WITH DIFFERENTIAL/PLATELET
BASOS ABS: 0 10*3/uL (ref 0.0–0.1)
BASOS PCT: 0 %
EOS ABS: 0.1 10*3/uL (ref 0.0–0.7)
EOS PCT: 3 %
HCT: 35.9 % — ABNORMAL LOW (ref 36.0–46.0)
Hemoglobin: 11.9 g/dL — ABNORMAL LOW (ref 12.0–15.0)
LYMPHS PCT: 48 %
Lymphs Abs: 2.1 10*3/uL (ref 0.7–4.0)
MCH: 30.7 pg (ref 26.0–34.0)
MCHC: 33.1 g/dL (ref 30.0–36.0)
MCV: 92.5 fL (ref 78.0–100.0)
MONO ABS: 0.3 10*3/uL (ref 0.1–1.0)
Monocytes Relative: 7 %
Neutro Abs: 1.8 10*3/uL (ref 1.7–7.7)
Neutrophils Relative %: 42 %
PLATELETS: 168 10*3/uL (ref 150–400)
RBC: 3.88 MIL/uL (ref 3.87–5.11)
RDW: 13.1 % (ref 11.5–15.5)
WBC: 4.4 10*3/uL (ref 4.0–10.5)

## 2014-12-03 LAB — I-STAT CHEM 8, ED
BUN: 96 mg/dL — ABNORMAL HIGH (ref 6–20)
CALCIUM ION: 1.23 mmol/L (ref 1.13–1.30)
CREATININE: 1.9 mg/dL — AB (ref 0.44–1.00)
Chloride: 113 mmol/L — ABNORMAL HIGH (ref 101–111)
GLUCOSE: 115 mg/dL — AB (ref 65–99)
HCT: 36 % (ref 36.0–46.0)
HEMOGLOBIN: 12.2 g/dL (ref 12.0–15.0)
POTASSIUM: 4.8 mmol/L (ref 3.5–5.1)
Sodium: 144 mmol/L (ref 135–145)
TCO2: 21 mmol/L (ref 0–100)

## 2014-12-03 LAB — CBG MONITORING, ED: GLUCOSE-CAPILLARY: 119 mg/dL — AB (ref 65–99)

## 2014-12-03 MED ORDER — LORAZEPAM 2 MG/ML IJ SOLN
0.5000 mg | Freq: Once | INTRAMUSCULAR | Status: AC
  Administered 2014-12-03: 0.5 mg via INTRAVENOUS
  Filled 2014-12-03: qty 1

## 2014-12-03 MED ORDER — SODIUM CHLORIDE 0.9 % IV SOLN
INTRAVENOUS | Status: DC
  Administered 2014-12-03: 02:00:00 via INTRAVENOUS

## 2014-12-03 NOTE — Discharge Instructions (Signed)
I discussed your blood flow study with the vascular surgeon on call. He stated there was no major changes in your blood flow. Follow-up your primary care doctor tomorrow

## 2014-12-03 NOTE — ED Provider Notes (Signed)
9:50 AM d/w Dr. Bonnielee Haff, pt is unable to have MRA with contrast due to low GFR of 25, also due to having iliac stents he states the MR will not be able to show much information about the iliac arteries.  He recommends a vascular ultrasound which has been ordered- last similar test was 12/15.  Will also d/w Dr. Darrick Penna.    12:23 PM d/w Dr. Darrick Penna, pt is currently in MRI getting lumbar spine MRI- I have talked with vascular techs multiple times about getting vascular study done this morning- they have mentioned that since she had ABIs done in August the study today may not be of much value.  I have encouraged them to do the study.  Also they have concern about the [place] boot- I have talked with nurse about removing this for the study- and if vascular techs are unable to get ABI on that side- would need to get the best information they can from the other side.  Dr. Darrick Penna agrees with getting ABIs done and if they are greater than 0.5 this rules out vascular emergency.    1:45 PM pt continuing to await vascular study.  I have updated family at bedside.  Pt continues to have warm feet bilaterally with palpable DP pulses bilaterally.   3:17 PM pt is in vascular lab now  4:00 PM- pt signed out to Dr. Adriana Simas pending vascular studies and disposition  Jerelyn Scott, MD 12/11/14 [phone]

## 2014-12-03 NOTE — ED Notes (Signed)
Went to MRI and medicated pt with Ativan. MRI staff asked to transport pt vascular once MRI is completed.

## 2014-12-03 NOTE — ED Provider Notes (Signed)
CSN: [id number]     Arrival date & time 12/02/14  2046 History   This chart was scribed for Ashley Porter, MD by [name], ED Scribe. This patient was seen in room A10C/A10C and the patient's care was started 1:57 AM.   Chief Complaint  Patient presents with  . Leg Pain   The history is provided by the patient. No language interpreter was used.    HPI Comments: [name] brought in by EMS is a 81 y.o. female with a PMHx of CAD, DM, hyperlipidemia, HTN, TIA, NSTEMI, stroke, and DVT who presents to the Emergency Department complaining of intermittent, ongoing bilateral leg pain and numbness x 1 day that started about 7 PM. She is unable to describe pain, except he described them as twisting (denies cramping, sharp, or achiness) but states episodes last 10 minutes when they come. Currently she is pain free, last episode 8:00 PM last night. Discomfort is made worse with palpation of legs, weight bearing, and ambulation. She states since she has had the pain her legs are weak and when she tries to stand her legs buckle. She also has numbness in her legs bilaterally. She denies numbness in her extremities, or face. She denies headache. No alleviating factors at this time. OTC medications and home remedies attempted prior to arrival without any improvement. No recent fever, chills, nausea, vomiting, dysuria, shortness of breath, or chest pain. Denies any back pain. No recent bowel or urinary incontinence. [name] takes a baby ASA daily.    PCP: Ashley Chandler, NP  Vein Specialist: Dr. Ruta Walters   Past Medical History  Diagnosis Date  . Coronary artery disease     Remote PCI. Had BMS to RCA and DES stent to Diagonal in Jan. 2012   . Diabetes mellitus     long standing  . Diastolic heart failure     EF 55 to 60% per cath in Jan 2012  . Hyperlipidemia   . Hypertension   . Carotid bruit   . Gout   . Obesity   . TIA (transient ischemic attack)   . Chronic kidney disease     Stage  3  . Ischemic cardiomyopathy     with prior EF of 35%  . Anemia   . Proteinuria   . NSTEMI (non-ST elevated myocardial infarction) Jan 2012    with PCI to RCA and DX per Dr. Burt Walters  . Carotid artery occlusion   . Stroke 2013    ?  mini    . DVT (deep venous thrombosis)    Past Surgical History  Procedure Laterality Date  . Appendectomy    . Tonsillectomy    . Left ankle fusion    . Distal rca  04/05/2010    stent  . Fracture surgery Right 2005    bilateral ankles   . Abdominal aortagram N/A 01/17/2014    Procedure: ABDOMINAL AORTAGRAM;  Surgeon: Ashley Dutch, MD;  [facility] C[facility] LAB;  Service: Cardiovascular;  Laterality: N/A;   Family History  Problem Relation Age of Onset  . Heart disease Neg Hx     both parents were murdered   Social History  Substance Use Topics  . Smoking status: Former Smoker    Quit date: 03/21/1968  . Smokeless tobacco: Never Used  . Alcohol Use: No  lives at home Uses a walker Daughter lives with her  OB History    No data available     Review of Systems  Constitutional: Negative for fever and chills.  Respiratory: Negative for cough and shortness of breath.   Cardiovascular: Negative for chest pain.  Gastrointestinal: Negative for nausea, vomiting and abdominal pain.  Genitourinary: Negative for dysuria and difficulty urinating.  Musculoskeletal: Positive for arthralgias. Negative for back pain.  Skin: Negative for rash.  Neurological: Positive for numbness. Negative for headaches.  Psychiatric/Behavioral: Negative for confusion.  All other systems reviewed and are negative.     Allergies  Percocet; Sulfa antibiotics; Tradjenta; and Penicillins  Home Medications   Prior to Admission medications   Medication Sig Start Date End Date Taking? Authorizing Provider  aspirin 81 MG tablet Take 81 mg by mouth daily. For heart health   Yes Historical Provider, MD  cloNIDine (CATAPRES) 0.1 MG tablet Take 1 tablet (0.1 mg total)  by mouth daily. For high blood pressure 09/04/14  Yes Ashley Chandler, NP  colesevelam [facility]) 625 MG tablet Take 1 tablet (625 mg total) by mouth 2 (two) times daily with a meal. For cholesterol 09/04/14  Yes Ashley Chandler, NP  ezetimibe-simvastatin (VYTORIN) 10-20 MG per tablet TAKE 1/2 TABLET BY MOUTH EVERY NIGHT AT BEDTIME for high cholesterol 09/04/14  Yes Ashley Chandler, NP  ferrous sulfate 325 (65 FE) MG tablet Take 1 tablet (325 mg total) by mouth 2 (two) times daily with a meal. For anemia 09/04/14  Yes Ashley Chandler, NP  furosemide (LASIX) 20 MG tablet Take 1 tablet (20 mg total) by mouth daily. For swelling 09/04/14  Yes Ashley Chandler, NP  losartan (COZAAR) 50 MG tablet Take 1 tablet (50 mg total) by mouth daily. For high blood pressure 09/04/14  Yes Ashley Chandler, NP  LOTEMAX 0.5 % GEL Place 1 drop into the left eye 2 (two) times daily. 08/21/14  Yes Historical Provider, MD  omega-3 acid ethyl esters (LOVAZA) 1 G capsule TAKE 2 CAPSULES BY MOUTH EVERY NIGHT AT BEDTIME FOR 30 DAYS 12/01/14  Yes Ashley Chandler, NP  amLODipine (NORVASC) 5 MG tablet Take 1 tablet (5 mg total) by mouth daily. For high blood pressure Patient not taking: Reported on 12/03/2014 09/04/14   Ashley Chandler, NP  Blood Glucose Monitoring Suppl (ONE TOUCH ULTRA SYSTEM KIT) W/DEVICE KIT E11.59 Check blood sugar daily as directed 08/07/14   Ashley Chandler, NP  carvedilol (COREG) 3.125 MG tablet TAKE 1 TABLET BY MOUTH TWICE DAILY FOR HIGH BLOOD PRESSURE Patient not taking: Reported on 12/03/2014 09/04/14   Ashley Chandler, NP  doxycycline (VIBRAMYCIN) 100 MG capsule Take 1 capsule (100 mg total) by mouth 2 (two) times daily. Patient not taking: Reported on 12/03/2014 11/20/14   Ashley Leyden Nickel, NP  glucose blood test strip E11.59 Check blood sugar daily as directed 08/07/14   Ashley Chandler, NP  nitroGLYCERIN (NITROSTAT) 0.4 MG SL tablet Dissolve one tablet under tongue as needed for chest pain. May  repeat as needed every 5 minutes up to 3 doses. Seek medical attention if pain is not relieved after 3 doses 10/27/14   Ashley Chandler, NP  omega-3 acid ethyl esters (LOVAZA) 1 G capsule TAKE 1 CAPSULE BY MOUTH EVERY MORNING AND 1 CAPSULE IN  EVENING Patient not taking: Reported on 12/03/2014 09/04/14   Ashley Chandler, NP  ONE [facility] ULTRA TEST test strip CHECK BLOOD SUGAR DAILY 12/01/14   Ashley Chandler, NP  [facility] LANCETS FINE MISC E11.59 Check blood sugar daily as directed 08/07/14   Ashley Chandler, NP  Triage Vitals: BP 178/48 mmHg  Pulse 84  Temp(Src) 97.8 F (36.6 C) (Oral)  Resp 18  Ht 5' (1.524 m)  Wt 140 lb (63.504 kg)  BMI 27.34 kg/m2  SpO2 97%  Vital signs normal except for hypertension    Physical Exam  Constitutional: She is oriented to person, place, and time. She appears well-developed and well-nourished.  Non-toxic appearance. She does not appear ill. No distress.  HENT:  Head: Normocephalic and atraumatic.  Right Ear: External ear normal.  Left Ear: External ear normal.  Nose: Nose normal. No mucosal edema or rhinorrhea.  Mouth/Throat: Oropharynx is clear and moist and mucous membranes are normal. No dental abscesses or uvula swelling.  Eyes: Conjunctivae and EOM are normal. Pupils are equal, round, and reactive to light.  Neck: Normal range of motion and full passive range of motion without pain. Neck supple.  Cardiovascular: Normal rate, regular rhythm and normal heart sounds.  Exam reveals no gallop and no friction rub.   No murmur heard. Pulmonary/Chest: Effort normal and breath sounds normal. No respiratory distress. She has no wheezes. She has no rhonchi. She has no rales. She exhibits no tenderness and no crepitus.  Abdominal: Soft. Normal appearance and bowel sounds are normal. She exhibits no distension. There is no tenderness. There is no rebound and no guarding.  Musculoskeletal: Normal range of motion. She exhibits tenderness. She exhibits  no edema.  Moves all extremities well.  Una wrap on R lower leg Tenderness to palpation over R lower leg between knee and ankle Delayed cap refill of toes but skin warm to touch  Neurological: She is alert and oriented to person, place, and time. She has normal strength. No cranial nerve deficit.  Skin: Skin is warm, dry and intact. No rash noted. No erythema. No pallor.  Psychiatric: She has a normal mood and affect. Her speech is normal and behavior is normal. Her mood appears not anxious.  Nursing note and vitals reviewed.   ED Course  Procedures (including critical care time) Medications  0.9 %  sodium chloride infusion ( Intravenous New Bag/Given 12/03/14 0229)  LORazepam (ATIVAN) injection 0.5 mg (0.5 mg Intravenous Given 12/03/14 0440)     DIAGNOSTIC STUDIES: Oxygen Saturation is 97% on RA, adequate by my interpretation.    COORDINATION OF CARE: 2:06 AM-Discussed treatment plan with pt at bedside and pt agreed to plan.   Initially CT angiogram was ordered however patient's creatinine function will not allow her to receive IV dye. I talked to the radiologist and they suggested we do a MRA of her pelvis to evaluate her vascular tree. Unfortunately, after talking to the MRI technician that cannot be done until 7 AM.  08:45 pt left at change of shift with Dr Canary Brim to get her MRA results.    Review of patient's chart shows she had bilateral CIA stent placed in 2015 .Operative findings: #1 bilateral 7 French sheath groin puncture   #2 right common iliac 7 x 24 mm, left common iliac 7 x 39 mm  #3 AV fistula right lower extremity most likely originating tibioperoneal trunk  #4 multi segment high-grade stenosis right superficial femoral artery  #5 in-line flow left lower extremity with several segments of moderate left superficial femoral artery stenosis two-vessel  runoff AT and peroneal   Labs Review Results for orders placed or performed during the hospital encounter of 12/03/14  CBC with Differential  Result Value Ref Range   WBC 4.4 4.0 - 10.5 K/uL  RBC 3.88 3.87 - 5.11 MIL/uL   Hemoglobin 11.9 (L) 12.0 - 15.0 g/dL   HCT 35.9 (L) 36.0 - 46.0 %   MCV 92.5 78.0 - 100.0 fL   MCH 30.7 26.0 - 34.0 pg   MCHC 33.1 30.0 - 36.0 g/dL   RDW 13.1 11.5 - 15.5 %   Platelets 168 150 - 400 K/uL   Neutrophils Relative % 42 %   Neutro Abs 1.8 1.7 - 7.7 K/uL   Lymphocytes Relative 48 %   Lymphs Abs 2.1 0.7 - 4.0 K/uL   Monocytes Relative 7 %   Monocytes Absolute 0.3 0.1 - 1.0 K/uL   Eosinophils Relative 3 %   Eosinophils Absolute 0.1 0.0 - 0.7 K/uL   Basophils Relative 0 %   Basophils Absolute 0.0 0.0 - 0.1 K/uL  Comprehensive metabolic panel  Result Value Ref Range   Sodium 142 135 - 145 mmol/L   Potassium 4.8 3.5 - 5.1 mmol/L   Chloride 112 (H) 101 - 111 mmol/L   CO2 21 (L) 22 - 32 mmol/L   Glucose, Bld 125 (H) 65 - 99 mg/dL   BUN 93 (H) 6 - 20 mg/dL   Creatinine, Ser 1.89 (H) 0.44 - 1.00 mg/dL   Calcium 9.1 8.9 - 10.3 mg/dL   Total Protein 6.0 (L) 6.5 - 8.1 g/dL   Albumin 3.4 (L) 3.5 - 5.0 g/dL   AST 18 15 - 41 U/L   ALT 13 (L) 14 - 54 U/L   Alkaline Phosphatase 58 38 - 126 U/L   Total Bilirubin 0.5 0.3 - 1.2 mg/dL   GFR calc non Af Amer 23 (L) >60 mL/min   GFR calc Af Amer 27 (L) >60 mL/min   Anion gap 9 5 - 15  I-stat Chem 8, ED  Result Value Ref Range   Sodium 144 135 - 145 mmol/L   Potassium 4.8 3.5 - 5.1 mmol/L   Chloride 113 (H) 101 - 111 mmol/L   BUN 96 (H) 6 - 20 mg/dL   Creatinine, Ser 1.90 (H) 0.44 - 1.00 mg/dL   Glucose, Bld 115 (H) 65 - 99 mg/dL   Calcium, Ion 1.23 1.13 - 1.30 mmol/L   TCO2 21 0 - 100 mmol/L   Hemoglobin 12.2 12.0 - 15.0 g/dL   HCT 36.0 36.0 - 46.0 %  CBG monitoring, ED  Result Value Ref Range   Glucose-Capillary 119 (H) 65 - 99 mg/dL   Laboratory interpretation all normal except renal  insufficiency     Imaging Review No results found. I have personally reviewed and evaluated these images and lab results as part of my medical decision-making.   EKG Interpretation None      MDM   Final diagnoses:  Numbness  Pain  Weakness   Disposition pending  Ashley Porter, MD, FACEP   I personally performed the services described in this documentation, which was scribed in my presence. The recorded information has been reviewed and considered.  Ashley Porter, MD, Barbette Or, MD 12/03/14 [phone]

## 2014-12-03 NOTE — ED Notes (Signed)
CBG obtained

## 2014-12-03 NOTE — Progress Notes (Signed)
VASCULAR LAB PRELIMINARY  ARTERIAL  ABI completed:    RIGHT    LEFT    PRESSURE WAVEFORM  PRESSURE WAVEFORM  BRACHIAL 209 Triphasic BRACHIAL 197 Triphasic  DP 78 Monophasic DP 114 Monophasic  AT 89 Monophasic AT 127 Monophasic  PT 78 Monophasic PT 95 Monophasic  GREAT TOE 62  NA GREAT TOE 73 NA    RIGHT LEFT  ABI 043 0.61   Right ABI indicates a severe reduction in arterial flow with abnormal Doppler waveforms. Left ABI indicates a moderate reduction in arterial flow with abnormal Doppler waveforms. Toe / Brachial indices are abnormal bilaterally.  Zilla Shartzer, RVTS 12/03/2014, 3:50 PM

## 2014-12-03 NOTE — ED Notes (Addendum)
Daughter [name]) requesting update if pt. Is admitted or discharge [phone])

## 2014-12-03 NOTE — ED Notes (Signed)
Patient transported to MRI 

## 2014-12-03 NOTE — ED Notes (Signed)
Patient transported to Ultrasound 

## 2014-12-04 ENCOUNTER — Ambulatory Visit (INDEPENDENT_AMBULATORY_CARE_PROVIDER_SITE_OTHER): Payer: Medicare Other | Admitting: Nurse Practitioner

## 2014-12-04 ENCOUNTER — Encounter: Payer: Self-pay | Admitting: Nurse Practitioner

## 2014-12-04 ENCOUNTER — Ambulatory Visit (INDEPENDENT_AMBULATORY_CARE_PROVIDER_SITE_OTHER): Payer: Medicare Other

## 2014-12-04 VITALS — BP 122/70 | HR 59 | Temp 97.6°F | Resp 14

## 2014-12-04 DIAGNOSIS — K5900 Constipation, unspecified: Secondary | ICD-10-CM

## 2014-12-04 DIAGNOSIS — I83003 Varicose veins of unspecified lower extremity with ulcer of ankle: Secondary | ICD-10-CM | POA: Insufficient documentation

## 2014-12-04 DIAGNOSIS — I83212 Varicose veins of right lower extremity with both ulcer of calf and inflammation: Secondary | ICD-10-CM

## 2014-12-04 DIAGNOSIS — I83213 Varicose veins of right lower extremity with both ulcer of ankle and inflammation: Secondary | ICD-10-CM | POA: Diagnosis not present

## 2014-12-04 DIAGNOSIS — I83215 Varicose veins of right lower extremity with both ulcer other part of foot and inflammation: Secondary | ICD-10-CM

## 2014-12-04 DIAGNOSIS — Z23 Encounter for immunization: Secondary | ICD-10-CM | POA: Diagnosis not present

## 2014-12-04 DIAGNOSIS — I739 Peripheral vascular disease, unspecified: Secondary | ICD-10-CM

## 2014-12-04 DIAGNOSIS — L98499 Non-pressure chronic ulcer of skin of other sites with unspecified severity: Secondary | ICD-10-CM

## 2014-12-04 DIAGNOSIS — I83214 Varicose veins of right lower extremity with both ulcer of heel and midfoot and inflammation: Secondary | ICD-10-CM | POA: Diagnosis not present

## 2014-12-04 DIAGNOSIS — L97309 Non-pressure chronic ulcer of unspecified ankle with unspecified severity: Secondary | ICD-10-CM

## 2014-12-04 DIAGNOSIS — I83211 Varicose veins of right lower extremity with both ulcer of thigh and inflammation: Secondary | ICD-10-CM

## 2014-12-04 DIAGNOSIS — L97319 Non-pressure chronic ulcer of right ankle with unspecified severity: Principal | ICD-10-CM

## 2014-12-04 DIAGNOSIS — R531 Weakness: Secondary | ICD-10-CM | POA: Diagnosis not present

## 2014-12-04 DIAGNOSIS — E1159 Type 2 diabetes mellitus with other circulatory complications: Secondary | ICD-10-CM | POA: Diagnosis not present

## 2014-12-04 DIAGNOSIS — I70209 Unspecified atherosclerosis of native arteries of extremities, unspecified extremity: Secondary | ICD-10-CM

## 2014-12-04 DIAGNOSIS — I83219 Varicose veins of right lower extremity with both ulcer of unspecified site and inflammation: Secondary | ICD-10-CM

## 2014-12-04 DIAGNOSIS — I83218 Varicose veins of right lower extremity with both ulcer of other part of lower extremity and inflammation: Secondary | ICD-10-CM

## 2014-12-04 DIAGNOSIS — I83013 Varicose veins of right lower extremity with ulcer of ankle: Secondary | ICD-10-CM

## 2014-12-04 NOTE — Progress Notes (Signed)
Patient ID: MUSETTE KISAMORE, female   DOB: 1927-07-25, 81 y.o.   MRN: [id number]    PCP: Lauree Chandler, NP  Advanced Directive information Does patient have an advance directive?: Yes, Type of Advance Directive: [place];Living will, Does patient want to make changes to advanced directive?: No - Patient declined  Allergies  Allergen Reactions  . Percocet [Oxycodone-Acetaminophen] Other (See Comments)  . Sulfa Antibiotics Other (See Comments)    Tongue swells  . Tradjenta [Linagliptin]     Low back ache, resolved once medication stopped   . Penicillins Hives    Tolerates Ceftriaxone    Chief Complaint  Patient presents with  . Medical Management of Chronic Issues    3 month follow-up, no concerns   . Follow-up    ER follow-up      HPI: Patient is a 81 y.o. female seen in the office today for routine follow up. Pt was seen in the ED yesterday due to severe pain in groin radiating down to ankles. Had pain twice yesterday but has not had since. Had MRI of back and vascular studies which were basically unchanged. Has not had another episode since.  Reports she is not has stable on her feet. at this time requiring to hold onto something to support her at this time. Currently in [place]. Feels like her equilibrium is off. Not dizzy. Denies being light headed.  Walking in the house, does not use walker but uses walls and dresser for support. Exercises legs while she is sitting down.  No pain with walking. Following with vascular and vein once a week due to ulcer on right leg. wearing unna boot. Did not get blood work done prior to visit.  Does not check blood sugars routinely at home.   Review of Systems:  Review of Systems  Constitutional: Negative for fever, chills, diaphoresis and fatigue.  Respiratory: Negative for cough and shortness of breath.   Cardiovascular: Positive for leg swelling. Negative for chest pain.  Gastrointestinal: Negative for abdominal pain,  diarrhea and constipation.  Genitourinary: Negative for dysuria and urgency.  Musculoskeletal: Negative for myalgias.  Skin: Positive for wound.       Followed by vein and vascular due to ulcer   Neurological: Negative for dizziness, weakness, light-headedness, numbness and headaches.  Psychiatric/Behavioral: The patient is not nervous/anxious.     Past Medical History  Diagnosis Date  . Coronary artery disease     Remote PCI. Had BMS to RCA and DES stent to Diagonal in Jan. 2012   . Diabetes mellitus     long standing  . Diastolic heart failure     EF 55 to 60% per cath in Jan 2012  . Hyperlipidemia   . Hypertension   . Carotid bruit   . Gout   . Obesity   . TIA (transient ischemic attack)   . Chronic kidney disease     Stage 3  . Ischemic cardiomyopathy     with prior EF of 35%  . Anemia   . Proteinuria   . NSTEMI (non-ST elevated myocardial infarction) Jan 2012    with PCI to RCA and DX per Dr. Burt Knack  . Carotid artery occlusion   . Stroke 2013    ?  mini    . DVT (deep venous thrombosis)    Past Surgical History  Procedure Laterality Date  . Appendectomy    . Tonsillectomy    . Left ankle fusion    . Distal rca  04/05/2010    stent  . Fracture surgery Right 2005    bilateral ankles   . Abdominal aortagram N/A 01/17/2014    Procedure: ABDOMINAL AORTAGRAM;  Surgeon: Elam Dutch, MD;  Location: [facility] CATH LAB;  Service: Cardiovascular;  Laterality: N/A;   Social History:   reports that she quit smoking about 46 years ago. She has never used smokeless tobacco. She reports that she does not drink alcohol or use illicit drugs.  Family History  Problem Relation Age of Onset  . Heart disease Neg Hx     both parents were murdered    Medications: Patient's Medications  New Prescriptions   No medications on file  Previous Medications   AMLODIPINE (NORVASC) 5 MG TABLET    Take 1 tablet (5 mg total) by mouth daily. For high blood pressure   ASPIRIN 81 MG TABLET     Take 81 mg by mouth daily. For heart health   BLOOD GLUCOSE MONITORING SUPPL (ONE TOUCH ULTRA SYSTEM KIT) W/DEVICE KIT    E11.59 Check blood sugar daily as directed   BROMFENAC SODIUM (PROLENSA) 0.07 % SOLN    Apply 1 drop to eye daily. Left eye   CARVEDILOL (COREG) 3.125 MG TABLET    TAKE 1 TABLET BY MOUTH TWICE DAILY FOR HIGH BLOOD PRESSURE   CLONIDINE (CATAPRES) 0.1 MG TABLET    Take 1 tablet (0.1 mg total) by mouth daily. For high blood pressure   COLESEVELAM (WELCHOL) 625 MG TABLET    Take 1 tablet (625 mg total) by mouth 2 (two) times daily with a meal. For cholesterol   EZETIMIBE-SIMVASTATIN (VYTORIN) 10-20 MG PER TABLET    TAKE 1/2 TABLET BY MOUTH EVERY NIGHT AT BEDTIME for high cholesterol   FERROUS SULFATE 325 (65 FE) MG TABLET    Take 1 tablet (325 mg total) by mouth 2 (two) times daily with a meal. For anemia   FUROSEMIDE (LASIX) 20 MG TABLET    Take 1 tablet (20 mg total) by mouth daily. For swelling   GLUCOSE BLOOD TEST STRIP    E11.59 Check blood sugar daily as directed   LOSARTAN (COZAAR) 50 MG TABLET    Take 1 tablet (50 mg total) by mouth daily. For high blood pressure   LOTEMAX 0.5 % GEL    Place 1 drop into the left eye 2 (two) times daily.   NITROGLYCERIN (NITROSTAT) 0.4 MG SL TABLET    Dissolve one tablet under tongue as needed for chest pain. May repeat as needed every 5 minutes up to 3 doses. Seek medical attention if pain is not relieved after 3 doses   OMEGA-3 ACID ETHYL ESTERS (LOVAZA) 1 G CAPSULE    TAKE 1 CAPSULE BY MOUTH EVERY MORNING AND 1 CAPSULE IN  EVENING   ONETOUCH DELICA LANCETS FINE MISC    E11.59 Check blood sugar daily as directed   TIMOLOL (BETIMOL) 0.5 % OPHTHALMIC SOLUTION    Place 1 drop into the left eye daily.  Modified Medications   No medications on file  Discontinued Medications   DOXYCYCLINE (VIBRAMYCIN) 100 MG CAPSULE    Take 1 capsule (100 mg total) by mouth 2 (two) times daily.   OMEGA-3 ACID ETHYL ESTERS (LOVAZA) 1 G CAPSULE    TAKE 2  CAPSULES BY MOUTH EVERY NIGHT AT BEDTIME FOR 30 DAYS   ONE TOUCH ULTRA TEST TEST STRIP    CHECK BLOOD SUGAR DAILY     Physical Exam:  Filed Vitals:   12/04/14 1052  BP: 122/70  Pulse: 59  Temp: 97.6 F (36.4 C)  TempSrc: Oral  Resp: 14  SpO2: 95%   There is no weight on file to calculate BMI.  Physical Exam  Constitutional: She is oriented to person, place, and time. No distress.  Frail elderly female, NAD  HENT:  Head: Normocephalic and atraumatic.  Mouth/Throat: Oropharynx is clear and moist. No oropharyngeal exudate.  Eyes: Conjunctivae are normal. Pupils are equal, round, and reactive to light.  Neck: Normal range of motion. Neck supple.  Cardiovascular: Normal rate, regular rhythm and normal heart sounds.   Pulmonary/Chest: Effort normal and breath sounds normal.  Abdominal: Soft. Bowel sounds are normal.  Musculoskeletal: She exhibits edema. She exhibits no tenderness.  Wearing unna boot to right leg, TED hose bilaterally   Neurological: She is alert and oriented to person, place, and time.  Slow unstable gait  Skin: Skin is warm and dry. She is not diaphoretic.  Psychiatric: She has a normal mood and affect.    Labs reviewed: Basic Metabolic Panel:  Recent Labs  04/02/14 1105 05/01/14 1041  08/07/14 1113 09/04/14 1053 12/03/14 0230 12/03/14 0238  NA 140  --   < > 144 144 142 144  K 4.8  --   < > 4.7 5.2 4.8 4.8  CL 107  --   < > 108 107 112* 113*  CO2 26  --   < > 20 24 21*  --   GLUCOSE 146*  --   < > 128* 146* 125* 115*  BUN 31*  --   < > 35* 42* 93* 96*  CREATININE 1.25*  --   < > 1.44* 1.59* 1.89* 1.90*  CALCIUM 8.8  --   < > 8.6* 8.9 9.1  --   TSH 5.43* 4.25  --   --   --   --   --   < > = values in this interval not displayed. Liver Function Tests:  Recent Labs  06/03/14 1838 10/21/14 1150 12/03/14 0230  AST _0 ALT 10 15 13*  ALKPHOS 67 60 58  BILITOT 0.4 0.4 0.5  PROT 5.5* 5.9* 6.0*  ALBUMIN 2.8* 3.5 3.4*   No results for  input(s): LIPASE, AMYLASE in the last 8760 hours. No results for input(s): AMMONIA in the last 8760 hours. CBC:  Recent Labs  06/09/14 0645 06/16/14 1519 08/07/14 1113 12/03/14 0230 12/03/14 0238  WBC 4.8 7.5 4.9 4.4  --   NEUTROABS  --  4.4 2.8 1.8  --   HGB 10.1* 10.8*  --  11.9* 12.2  HCT 31.5* 33.5* 34.6 35.9* 36.0  MCV 92.4 92  --  92.5  --   PLT 197 267  --  168  --    Lipid Panel:  Recent Labs  02/10/14 1212 10/21/14 1150  CHOL 127 135  HDL 43 43.80  LDLCALC 54 62  TRIG 151* 148.0  CHOLHDL 3.0 3   TSH:  Recent Labs  04/02/14 1105 05/01/14 1041  TSH 5.43* 4.25   A1C: Lab Results  Component Value Date   HGBA1C 7.6* 09/04/2014   Mr Lumbar Spine Wo Contrast  12/03/2014   CLINICAL DATA:  Severe low back pain and BILATERAL lower extremity pain and numbness. Symptoms for 1 day.  EXAM: MRI LUMBAR SPINE WITHOUT CONTRAST  TECHNIQUE: Multiplanar, multisequence MR imaging of the lumbar spine was performed. No intravenous contrast was administered.  COMPARISON:  CT abdomen and pelvis 06/04/2014.  FINDINGS: The patient was  unable to remain motionless for the exam. Small or subtle lesions could be overlooked.  Segmentation: Normal.  Alignment:  2 mm retrolisthesis L2-3 is facet mediated.  Vertebrae: No worrisome osseous lesion.Endplate reactive changes secondary to Schmorl's nodes and degenerative disc disease, non worrisome.  Conus medullaris: Normal in size, signal, and location.  Paraspinal tissues: No evidence for hydronephrosis or paravertebral mass. Prominent RIGHT extrarenal pelvis. No paravertebral masses.  Disc levels:  L1-L2:  Normal disc space.  Mild facet arthropathy.  L2-L3: Slight retrolisthesis which is facet mediated, estimated 2 mm. Posterior element hypertrophy. Mild stenosis with subarticular zone narrowing. BILATERAL L3 nerve root impingement is possible.  L3-L4: Mild bulge. Moderate posterior element hypertrophy. Leftward foraminal protrusion. Mild to moderate  stenosis. LEFT greater than RIGHT L4 and L3 nerve root impingement are possible.  L4-L5: Mild bulge. Moderate posterior element hypertrophy. Mild to moderate stenosis. BILATERAL subarticular zone narrowing could affect either L5 nerve root.  L5-S1: Mild bulge. Moderate posterior element hypertrophy. No impingement.  IMPRESSION: Mild to moderate multilevel spondylosis. Potentially symptomatic neural impingement at L2-3, L3-4, and L4-5.  No areas of critical spinal stenosis are observed.   Electronically Signed   By: Staci Righter M.D.   On: 12/03/2014 13:13    Assessment/Plan 1. Atherosclerotic PVD with ulceration -ongoing follow up with vascular. Wound stable and healing slowly. Cont with unna boot, notes reviewed in epic  2. Constipation, unspecified constipation type -stable and well controlled on current OTC regimen  3. Weakness -daughter contributes this to staying in the hospital bed 24 hours now with unsteady gait and will not use walker.  - Ambulatory referral to [place] for PT to evaluate and treat for gait and strength training to avoid falls and home safety   4. Type 2 diabetes mellitus with other circulatory complications -diet controlled, does not have blood sugars - Hemoglobin A1c - CMP  5. Need for prophylactic vaccination and inoculation against influenza - Flu Vaccine QUAD 36+ mos PF IM (Fluarix & Fluzone Quad PF)  Follow up in 3 months Tamzin Bertling K. Harle Battiest  [facility] [phone] 8 am - 5 pm) [phone] (after hours)

## 2014-12-04 NOTE — Progress Notes (Unsigned)
Patient [name] was seen today for an [employer] change of the  Right medial malleolus.  Pt did not have [employer] on, it was removed by ED visit  Last night 12-02-14.  Mrs. Lavoy states she had excruciating bilateral groin to foot pain for 30 min.twice that evening.   It was a level 10 pain which kept her up that night.  Her wound has decreased in size 2 mm.  New Unna Boot was applied with no difficulty and she will return in one week.

## 2014-12-04 NOTE — Patient Instructions (Signed)
Will get blood work today Home health therapy to evaluate and treat due to weakness and unsteady gait

## 2014-12-05 LAB — HEMOGLOBIN A1C
Est. average glucose Bld gHb Est-mCnc: 177 mg/dL
Hgb A1c MFr Bld: 7.8 % — ABNORMAL HIGH (ref 4.8–5.6)

## 2014-12-05 LAB — COMPREHENSIVE METABOLIC PANEL
ALT: 13 IU/L (ref 0–32)
AST: 12 IU/L (ref 0–40)
Albumin/Globulin Ratio: 1.9 (ref 1.1–2.5)
Albumin: 3.6 g/dL (ref 3.5–4.7)
Alkaline Phosphatase: 65 IU/L (ref 39–117)
BUN/Creatinine Ratio: 37 — ABNORMAL HIGH (ref 11–26)
BUN: 55 mg/dL — ABNORMAL HIGH (ref 8–27)
Bilirubin Total: <0.2 mg/dL (ref 0.0–1.2)
CO2: 20 mmol/L (ref 18–29)
Calcium: 8.6 mg/dL — ABNORMAL LOW (ref 8.7–10.3)
Chloride: 110 mmol/L — ABNORMAL HIGH (ref 97–108)
Creatinine, Ser: 1.5 mg/dL — ABNORMAL HIGH (ref 0.57–1.00)
GFR calc Af Amer: 36 mL/min/{1.73_m2} — ABNORMAL LOW (ref >59)
GFR calc non Af Amer: 32 mL/min/{1.73_m2} — ABNORMAL LOW (ref >59)
Globulin, Total: 1.9 g/dL (ref 1.5–4.5)
Glucose: 182 mg/dL — ABNORMAL HIGH (ref 65–99)
Potassium: 5.2 mmol/L (ref 3.5–5.2)
Sodium: 145 mmol/L — ABNORMAL HIGH (ref 134–144)
Total Protein: 5.5 g/dL — ABNORMAL LOW (ref 6.0–8.5)

## 2014-12-08 DIAGNOSIS — I5030 Unspecified diastolic (congestive) heart failure: Secondary | ICD-10-CM | POA: Diagnosis not present

## 2014-12-08 DIAGNOSIS — E669 Obesity, unspecified: Secondary | ICD-10-CM | POA: Diagnosis not present

## 2014-12-08 DIAGNOSIS — M47896 Other spondylosis, lumbar region: Secondary | ICD-10-CM | POA: Diagnosis not present

## 2014-12-08 DIAGNOSIS — E1121 Type 2 diabetes mellitus with diabetic nephropathy: Secondary | ICD-10-CM | POA: Diagnosis not present

## 2014-12-09 ENCOUNTER — Encounter: Payer: Self-pay | Admitting: Vascular Surgery

## 2014-12-11 ENCOUNTER — Ambulatory Visit (INDEPENDENT_AMBULATORY_CARE_PROVIDER_SITE_OTHER): Payer: Medicare Other

## 2014-12-11 DIAGNOSIS — I83219 Varicose veins of right lower extremity with both ulcer of unspecified site and inflammation: Secondary | ICD-10-CM

## 2014-12-11 DIAGNOSIS — I83214 Varicose veins of right lower extremity with both ulcer of heel and midfoot and inflammation: Secondary | ICD-10-CM | POA: Diagnosis not present

## 2014-12-11 DIAGNOSIS — I83212 Varicose veins of right lower extremity with both ulcer of calf and inflammation: Secondary | ICD-10-CM

## 2014-12-11 DIAGNOSIS — I83218 Varicose veins of right lower extremity with both ulcer of other part of lower extremity and inflammation: Secondary | ICD-10-CM

## 2014-12-11 DIAGNOSIS — I83213 Varicose veins of right lower extremity with both ulcer of ankle and inflammation: Secondary | ICD-10-CM | POA: Diagnosis not present

## 2014-12-11 DIAGNOSIS — I83215 Varicose veins of right lower extremity with both ulcer other part of foot and inflammation: Secondary | ICD-10-CM

## 2014-12-11 DIAGNOSIS — I83211 Varicose veins of right lower extremity with both ulcer of thigh and inflammation: Secondary | ICD-10-CM | POA: Diagnosis not present

## 2014-12-11 DIAGNOSIS — L97319 Non-pressure chronic ulcer of right ankle with unspecified severity: Principal | ICD-10-CM

## 2014-12-11 DIAGNOSIS — I83013 Varicose veins of right lower extremity with ulcer of ankle: Secondary | ICD-10-CM

## 2014-12-11 NOTE — Progress Notes (Signed)
Ms. Schwarz was seen today for an unna boot change to her Rt LE. The previous unna boot was removed. I cleaned her leg with soap and water and dried thoroughly. A new unna boot was applied without difficulty. She will return next week for reapplication. Chanda Maness-Harrison CMA, AAMA

## 2014-12-15 ENCOUNTER — Ambulatory Visit (INDEPENDENT_AMBULATORY_CARE_PROVIDER_SITE_OTHER): Payer: Medicare Other | Admitting: Pharmacotherapy

## 2014-12-15 ENCOUNTER — Encounter: Payer: Self-pay | Admitting: Pharmacotherapy

## 2014-12-15 VITALS — BP 128/80 | HR 61 | Temp 98.0°F | Ht 60.0 in | Wt 141.0 lb

## 2014-12-15 DIAGNOSIS — N183 Chronic kidney disease, stage 3 unspecified: Secondary | ICD-10-CM

## 2014-12-15 DIAGNOSIS — E1159 Type 2 diabetes mellitus with other circulatory complications: Secondary | ICD-10-CM

## 2014-12-15 DIAGNOSIS — I10 Essential (primary) hypertension: Secondary | ICD-10-CM

## 2014-12-15 MED ORDER — METFORMIN HCL ER 500 MG PO TB24: 500.0000 mg | ORAL_TABLET | Freq: Two times a day (BID) | ORAL | Status: DC

## 2014-12-15 NOTE — Progress Notes (Signed)
  Subjective:    [name] is a 81 y.o.white female who presents for follow-up of Type 2 diabetes mellitus.   She wanted to do a trial of lifestyle modification to treat her diabetes - was reluctant to start any medication at last OV. A1C is 7.8%  She makes healthy food choices.  Not skipping meals. Exercise consistent with the physical therapist recommendations. Does have peripheral edema. Denies problems with vision. She is losing vision.  Wears corrective lenses. Nocturia 1-2 times per night.  Review of Systems A comprehensive review of systems was negative except for: Eyes: positive for contacts/glasses and vision loss Gastrointestinal: positive for constipation Genitourinary: positive for nocturia    Objective:    BP 128/80 mmHg  Pulse 61  Temp(Src) 98 F (36.7 C) (Oral)  Ht 5' (1.524 m)  Wt 141 lb (63.957 kg)  BMI 27.54 kg/m2  SpO2 91%  General:  alert, cooperative and no distress  Oropharynx: normal findings: lips normal without lesions and gums healthy   Eyes:  negative findings: lids and lashes normal and conjunctivae and sclerae normal   Ears:  external ears normal        Lung: clear to auscultation bilaterally  Heart:  regular rate and rhythm     Extremities: edema bilateral lower extremities  Skin: dry     Neuro: mental status, speech normal, alert and oriented x3 and gait and station normal   Lab Review GLUCOSE (mg/dL)  Date Value  12/04/2014 182*  09/04/2014 146*  08/07/2014 128*   GLUCOSE, BLD (mg/dL)  Date Value  12/03/2014 115*  12/03/2014 125*  06/09/2014 138*   CO2 (mmol/L)  Date Value  12/04/2014 20  12/03/2014 21*  09/04/2014 24   BUN (mg/dL)  Date Value  12/04/2014 55*  12/03/2014 96*  12/03/2014 93*  09/04/2014 42*  08/07/2014 35*  06/09/2014 31*   CREATININE, SER (mg/dL)  Date Value  12/04/2014 1.50*  12/03/2014 1.90*  12/03/2014 1.89*       Assessment:    Diabetes Mellitus type II, under fair control. A1C  above goal <7.5% BP at goal <140/90   Plan:    1.  Rx changes: start Metformin ER $RemoveBefo'500mg'[id number]$  twice daily with meals.  eGFR >51ml/min. 2.  Counseled on nutrition goals. 3.  Exercise goal is 30-45 minutes 5 x week. 4.  Counseled on risk / benefit of metformin.  She did not tolerate DPP4 inhibitor in the past. 5.  BP at goal <140/90 6.  Repeat A1C in 3 months

## 2014-12-15 NOTE — Patient Instructions (Signed)
Start taking metformin ER  twice daily with meals.   May cause diarrhea or loose stools for the first 10-14 days.

## 2014-12-16 ENCOUNTER — Encounter: Payer: Self-pay | Admitting: Vascular Surgery

## 2014-12-18 ENCOUNTER — Ambulatory Visit (INDEPENDENT_AMBULATORY_CARE_PROVIDER_SITE_OTHER): Payer: Medicare Other

## 2014-12-18 DIAGNOSIS — I83013 Varicose veins of right lower extremity with ulcer of ankle: Secondary | ICD-10-CM

## 2014-12-18 DIAGNOSIS — L97319 Non-pressure chronic ulcer of right ankle with unspecified severity: Principal | ICD-10-CM

## 2014-12-18 DIAGNOSIS — I83211 Varicose veins of right lower extremity with both ulcer of thigh and inflammation: Secondary | ICD-10-CM | POA: Diagnosis not present

## 2014-12-18 DIAGNOSIS — I83214 Varicose veins of right lower extremity with both ulcer of heel and midfoot and inflammation: Secondary | ICD-10-CM

## 2014-12-18 DIAGNOSIS — I83212 Varicose veins of right lower extremity with both ulcer of calf and inflammation: Secondary | ICD-10-CM | POA: Diagnosis not present

## 2014-12-18 DIAGNOSIS — I83213 Varicose veins of right lower extremity with both ulcer of ankle and inflammation: Secondary | ICD-10-CM

## 2014-12-18 DIAGNOSIS — I83218 Varicose veins of right lower extremity with both ulcer of other part of lower extremity and inflammation: Secondary | ICD-10-CM

## 2014-12-18 DIAGNOSIS — I83219 Varicose veins of right lower extremity with both ulcer of unspecified site and inflammation: Secondary | ICD-10-CM

## 2014-12-18 DIAGNOSIS — I83215 Varicose veins of right lower extremity with both ulcer other part of foot and inflammation: Secondary | ICD-10-CM

## 2014-12-18 NOTE — Progress Notes (Signed)
[name] was seen today for an unna boot change to her Rt LE. The previous unna boot was removed carefully and her leg was cleansed well and dried. The area was draining slightly and [name] stated it was still very painful. A new unna boot was applied. Her daughter was instructed on how to remove the unna boot without cutting it the morning before her appointment so that she could wash her leg with soap and water. Her daughter verbalized understanding. She will be seen next week by Dr. Darrick Penna. Chanda Maness-Harrison CMA, AAMA

## 2014-12-23 ENCOUNTER — Emergency Department (INDEPENDENT_AMBULATORY_CARE_PROVIDER_SITE_OTHER)
Admission: EM | Admit: 2014-12-23 | Discharge: 2014-12-23 | Disposition: A | Discharge status: Home / Self Care | Payer: Medicare Other | Source: Home / Self Care | Attending: Emergency Medicine | Admitting: Emergency Medicine

## 2014-12-23 ENCOUNTER — Encounter: Payer: Self-pay | Admitting: Vascular Surgery

## 2014-12-23 ENCOUNTER — Encounter (HOSPITAL_COMMUNITY): Payer: Self-pay | Admitting: Emergency Medicine

## 2014-12-23 DIAGNOSIS — B372 Candidiasis of skin and nail: Secondary | ICD-10-CM

## 2014-12-23 DIAGNOSIS — K5909 Other constipation: Secondary | ICD-10-CM

## 2014-12-23 DIAGNOSIS — K5900 Constipation, unspecified: Secondary | ICD-10-CM | POA: Diagnosis not present

## 2014-12-23 DIAGNOSIS — N390 Urinary tract infection, site not specified: Secondary | ICD-10-CM

## 2014-12-23 LAB — POCT URINALYSIS DIP (DEVICE)
BILIRUBIN URINE: NEGATIVE
Glucose, UA: NEGATIVE mg/dL
Ketones, ur: NEGATIVE mg/dL
NITRITE: NEGATIVE
Protein, ur: 100 mg/dL — AB
SPECIFIC GRAVITY, URINE: 1.01 (ref 1.005–1.030)
Urobilinogen, UA: 0.2 mg/dL (ref 0.0–1.0)
pH: 5.5 (ref 5.0–8.0)

## 2014-12-23 MED ORDER — CLOTRIMAZOLE 1 % EX CREA: 1.0000 "application " | TOPICAL_CREAM | Freq: Two times a day (BID) | CUTANEOUS | Status: DC

## 2014-12-23 MED ORDER — CIPROFLOXACIN HCL 250 MG PO TABS: 250.0000 mg | ORAL_TABLET | Freq: Two times a day (BID) | ORAL | Status: DC

## 2014-12-23 NOTE — ED Provider Notes (Signed)
CSN: [id number]     Arrival date & time 12/23/14  1317 History   First MD Initiated Contact with Patient 12/23/14 1546     Chief Complaint  Patient presents with  . Constipation  . Urinary Retention  . Vaginal Discharge   (Consider location/radiation/quality/duration/timing/severity/associated sxs/prior Treatment) HPI Comments: 81 year old female with a 10 year history of chronic constipation presents today with constipation. She says been getting worse in the past few days. She also states she has had a couple bowel movements and with that feels like her rectum is coming out. The only medicine she is taking a stool softeners. No laxities. She is also complaining that it is hard to PE and that she may be retaining urine. Denies burning or dysuria. Past medical history significant for CAD, type 2 diabetes mellitus, diastolic heart failure, hypertension, TIA, CK D stage III, ischemic cardiomyopathy, DVT, non-STEMI MI in 2012. Denies chest pain or shortness of breath.  Patient is a 81 y.o. female presenting with constipation and vaginal discharge.  Constipation Severity:  Moderate Time since last bowel movement:  1 day Timing:  Constant Progression:  Worsening Chronicity:  Chronic Context: medication   Context: not dehydration   Stool description:  Hard and loose Relieved by:  Nothing Ineffective treatments:  Stool softeners Associated symptoms: no abdominal pain, no diarrhea, no dysuria, no fever, no nausea and no vomiting   Vaginal Discharge Associated symptoms: no abdominal pain, no dysuria, no fever, no nausea and no vomiting     Past Medical History  Diagnosis Date  . Coronary artery disease     Remote PCI. Had BMS to RCA and DES stent to Diagonal in Jan. 2012   . Diabetes mellitus     long standing  . Diastolic heart failure     EF 55 to 60% per cath in Jan 2012  . Hyperlipidemia   . Hypertension   . Carotid bruit   . Gout   . Obesity   . TIA (transient ischemic attack)    . Chronic kidney disease     Stage 3  . Ischemic cardiomyopathy     with prior EF of 35%  . Anemia   . Proteinuria   . NSTEMI (non-ST elevated myocardial infarction) [facility]) Jan 2012    with PCI to RCA and DX per Dr. Burt Knack  . Carotid artery occlusion   . Stroke [facility]) 2013    ?  mini    . DVT (deep venous thrombosis) [facility])    Past Surgical History  Procedure Laterality Date  . Appendectomy    . Tonsillectomy    . Left ankle fusion    . Distal rca  04/05/2010    stent  . Fracture surgery Right 2005    bilateral ankles   . Abdominal aortagram N/A 01/17/2014    Procedure: ABDOMINAL AORTAGRAM;  Surgeon: Elam Dutch, MD;  Location: [facility] CATH LAB;  Service: Cardiovascular;  Laterality: N/A;   Family History  Problem Relation Age of Onset  . Heart disease Neg Hx     both parents were murdered   Social History  Substance Use Topics  . Smoking status: Former Smoker    Quit date: 03/21/1968  . Smokeless tobacco: Never Used  . Alcohol Use: No   OB History    No data available     Review of Systems  Constitutional: Negative for fever and activity change.  HENT: Negative.   Respiratory: Negative.   Cardiovascular: Negative for chest pain and leg  swelling.  Gastrointestinal: Positive for constipation. Negative for nausea, vomiting, abdominal pain and diarrhea.  Genitourinary: Positive for vaginal discharge and difficulty urinating. Negative for dysuria.  Skin: Positive for rash.  All other systems reviewed and are negative.   Allergies  Percocet; Sulfa antibiotics; Tradjenta; and Penicillins  Home Medications   Prior to Admission medications   Medication Sig Start Date End Date Taking? Authorizing Provider  amLODipine (NORVASC) 5 MG tablet Take 1 tablet (5 mg total) by mouth daily. For high blood pressure 09/04/14  Yes Lauree Chandler, NP  aspirin 81 MG tablet Take 81 mg by mouth daily. For heart health   Yes Historical Provider, MD  Bromfenac Sodium (PROLENSA)  0.07 % SOLN Apply 1 drop to eye daily. Left eye   Yes Historical Provider, MD  carvedilol (COREG) 3.125 MG tablet TAKE 1 TABLET BY MOUTH TWICE DAILY FOR HIGH BLOOD PRESSURE 09/04/14  Yes Lauree Chandler, NP  cloNIDine (CATAPRES) 0.1 MG tablet Take 1 tablet (0.1 mg total) by mouth daily. For high blood pressure 09/04/14  Yes Lauree Chandler, NP  ferrous sulfate 325 (65 FE) MG tablet Take 1 tablet (325 mg total) by mouth 2 (two) times daily with a meal. For anemia 09/04/14  Yes Lauree Chandler, NP  furosemide (LASIX) 20 MG tablet Take 1 tablet (20 mg total) by mouth daily. For swelling 09/04/14  Yes Lauree Chandler, NP  losartan (COZAAR) 50 MG tablet Take 1 tablet (50 mg total) by mouth daily. For high blood pressure 09/04/14  Yes Lauree Chandler, NP  metFORMIN (GLUCOPHAGE-XR) 500 MG 24 hr tablet Take 1 tablet (500 mg total) by mouth 2 (two) times daily with a meal. 12/15/14  Yes Tivis Ringer, RPH-CPP  omega-3 acid ethyl esters (LOVAZA) 1 G capsule TAKE 1 CAPSULE BY MOUTH EVERY MORNING AND 1 CAPSULE IN  EVENING 09/04/14  Yes Lauree Chandler, NP  timolol (BETIMOL) 0.5 % ophthalmic solution Place 1 drop into the left eye daily.   Yes Historical Provider, MD  Blood Glucose Monitoring Suppl (ONE TOUCH ULTRA SYSTEM KIT) W/DEVICE KIT E11.59 Check blood sugar daily as directed 08/07/14   Lauree Chandler, NP  ciprofloxacin (CIPRO) 250 MG tablet Take 1 tablet (250 mg total) by mouth every 12 (twelve) hours. 12/23/14   Janne Napoleon, NP  clotrimazole (LOTRIMIN) 1 % cream Apply 1 application topically 2 (two) times daily. Apply the cream twice a day to affected areas of the abdomen and around the anus. 12/23/14   Janne Napoleon, NP  colesevelam [facility]) 625 MG tablet Take 1 tablet (625 mg total) by mouth 2 (two) times daily with a meal. For cholesterol 09/04/14   Lauree Chandler, NP  ezetimibe-simvastatin (VYTORIN) 10-20 MG per tablet TAKE 1/2 TABLET BY MOUTH EVERY NIGHT AT BEDTIME for high cholesterol 09/04/14    Lauree Chandler, NP  glucose blood test strip E11.59 Check blood sugar daily as directed 08/07/14   Lauree Chandler, NP  LOTEMAX 0.5 % GEL Place 1 drop into the left eye 2 (two) times daily. 08/21/14   Historical Provider, MD  nitroGLYCERIN (NITROSTAT) 0.4 MG SL tablet Dissolve one tablet under tongue as needed for chest pain. May repeat as needed every 5 minutes up to 3 doses. Seek medical attention if pain is not relieved after 3 doses 10/27/14   Lauree Chandler, NP  [facility] LANCETS FINE MISC E11.59 Check blood sugar daily as directed 08/07/14   Lauree Chandler, NP  Meds Ordered and Administered this Visit  Medications - No data to display  BP 119/54 mmHg  Pulse 94  Temp(Src) 97.4 F (36.3 C) (Oral)  Resp 16  SpO2 95% No data found.   Physical Exam  Constitutional: She is oriented to person, place, and time. She appears well-developed and well-nourished. No distress.  Neck: Normal range of motion. Neck supple.  Cardiovascular: Normal rate and regular rhythm.   Pulmonary/Chest: Effort normal and breath sounds normal. No respiratory distress.  Abdominal: Soft. Bowel sounds are normal. She exhibits no distension. There is no tenderness. There is no rebound and no guarding.  Abdomen mildly obese but soft. There is no tenderness. No palpable masses no areas of palpable that are suggestive of stool. There is no suprapubic distention. The bladder is not palpable.  Genitourinary:  Rectal exam reveals mildly loose sphincter tone. The area of the skin surrounding the anus and a portion of the vagina is erythematous, well marginated and moist. This is consistent with Candida. DRE reveals no stool in the rectal vault. No blood. And no discharge. Cursory evaluation of the external vagina reveals no discharge or discoloration within the vulva.  Musculoskeletal: She exhibits no edema.  Neurological: She is alert and oriented to person, place, and time. She exhibits normal muscle tone.   Skin: Skin is warm and dry. Rash noted.  Psychiatric: She has a normal mood and affect.  Nursing note and vitals reviewed.   ED Course  Procedures (including critical care time)  Labs Review Labs Reviewed  POCT URINALYSIS DIP (DEVICE) - Abnormal; Notable for the following:    Hgb urine dipstick SMALL (*)    Protein, ur 100 (*)    Leukocytes, UA TRACE (*)    All other components within normal limits   Results for orders placed or performed during the hospital encounter of 12/23/14  POCT urinalysis dip (device)  Result Value Ref Range   Glucose, UA NEGATIVE NEGATIVE mg/dL   Bilirubin Urine NEGATIVE NEGATIVE   Ketones, ur NEGATIVE NEGATIVE mg/dL   Specific Gravity, Urine 1.010 1.005 - 1.030   Hgb urine dipstick SMALL (A) NEGATIVE   pH 5.5 5.0 - 8.0   Protein, ur 100 (A) NEGATIVE mg/dL   Urobilinogen, UA 0.2 0.0 - 1.0 mg/dL   Nitrite NEGATIVE NEGATIVE   Leukocytes, UA TRACE (A) NEGATIVE     Imaging Review No results found.   Visual Acuity Review  Right Eye Distance:   Left Eye Distance:   Bilateral Distance:    Right Eye Near:   Left Eye Near:    Bilateral Near:         MDM   1. Chronic constipation   2. Candidal intertrigo   3. Skin candidiasis   4. UTI (lower urinary tract infection)    Lotrisone cream to affected areas of candida infection. Use MiraLAX as needed for constipation. Examination today does not suggest acute constipation. Cipro 250 by mouth 2 times a day. Drink plenty of fluids stay well-hydrated Follow-up with your doctor later this week. 4 rectal exam and abdominal exam Livia, EMT was present.     Janne Napoleon, NP 12/23/14 1622

## 2014-12-23 NOTE — ED Notes (Signed)
Pt c/o constipation; has not had a regular BM in several days  Hx of chronic constipation ever since colonoscopy 10 years ago Also c/o urinary retention x2 days associated w/abd pain Has also noticed vag d/c Slow steady gait assisted by walker A&O x4... No acute distress.

## 2014-12-23 NOTE — Discharge Instructions (Signed)
Antibiotic Medication Antibiotic medicine helps fight germs. Germs cause infections. This type of medicine will not work for colds, flu, or other viral infections. Tell your doctor if you:  Are allergic to any medicines.  Are pregnant or are trying to get pregnant.  Are taking other medicines.  Have other medical problems. HOME CARE  Take your medicine with a glass of water or food as told by your doctor.  Take the medicine as told. Finish them even if you start to feel better.  Do not give your medicine to other people.  Do not use your medicine in the future for a different infection.  Ask your doctor about which side effects to watch for.  Try not to miss any doses. If you miss a dose, take it as soon as possible. If it is almost time for your next dose, and your dosing schedule is:  Two doses a day, take the missed dose and the next dose 5 to 6 hours later.  Three or more doses a day, take the missed dose and the next dose 2 to 4 hours later, or double your next dose.  Then go back to your normal schedule. GET HELP RIGHT AWAY IF:   You get worse or do not get better within a few days.  The medicine makes you sick.  You develop a rash or any other side effects.  You have questions or concerns. MAKE SURE YOU:  Understand these instructions.  Will watch your condition.  Will get help right away if you are not doing well or get worse. Document Released: 12/15/2007 Document Revised: 05/30/2011 Document Reviewed: 02/10/2009 [facility] Patient Information 2015 [place]. This information is not intended to replace advice given to you by your health care provider. Make sure you discuss any questions you have with your health care provider.  Candida Infection A Candida infection (also called yeast, fungus, and Monilia infection) is an overgrowth of yeast that can occur anywhere on the body. A yeast infection commonly occurs in warm, moist body areas. Usually, the  infection remains localized but can spread to become a systemic infection. A yeast infection may be a sign of a more severe disease such as diabetes, leukemia, or AIDS. A yeast infection can occur in both men and women. In women, Candida vaginitis is a vaginal infection. It is one of the most common causes of vaginitis. Men usually do not have symptoms or know they have an infection until other problems develop. Men may find out they have a yeast infection because their sex partner has a yeast infection. Uncircumcised men are more likely to get a yeast infection than circumcised men. This is because the uncircumcised glans is not exposed to air and does not remain as dry as that of a circumcised glans. Older adults may develop yeast infections around dentures. CAUSES  Women  Antibiotics.  Steroid medication taken for a long time.  Being overweight (obese).  Diabetes.  Poor immune condition.  Certain serious medical conditions.  Immune suppressive medications for organ transplant patients.  Chemotherapy.  Pregnancy.  Menstruation.  Stress and fatigue.  Intravenous drug use.  Oral contraceptives.  Wearing tight-fitting clothes in the crotch area.  Catching it from a sex partner who has a yeast infection.  Spermicide.  Intravenous, urinary, or other catheters. Men  Catching it from a sex partner who has a yeast infection.  Having oral or anal sex with a person who has the infection.  Spermicide.  Diabetes.  Antibiotics.  Poor immune system.  Medications that suppress the immune system.  Intravenous drug use.  Intravenous, urinary, or other catheters. SYMPTOMS  Women  Thick, white vaginal discharge.  Vaginal itching.  Redness and swelling in and around the vagina.  Irritation of the lips of the vagina and perineum.  Blisters on the vaginal lips and perineum.  Painful sexual intercourse.  Low blood sugar (hypoglycemia).  Painful  urination.  Bladder infections.  Intestinal problems such as constipation, indigestion, bad breath, bloating, increase in gas, diarrhea, or loose stools. Men  Men may develop intestinal problems such as constipation, indigestion, bad breath, bloating, increase in gas, diarrhea, or loose stools.  Dry, cracked skin on the penis with itching or discomfort.  Jock itch.  Dry, flaky skin.  Athlete's foot.  Hypoglycemia. DIAGNOSIS  Women  A history and an exam are performed.  The discharge may be examined under a microscope.  A culture may be taken of the discharge. Men  A history and an exam are performed.  Any discharge from the penis or areas of cracked skin will be looked at under the microscope and cultured.  Stool samples may be cultured. TREATMENT  Women  Vaginal antifungal suppositories and creams.  Medicated creams to decrease irritation and itching on the outside of the vagina.  Warm compresses to the perineal area to decrease swelling and discomfort.  Oral antifungal medications.  Medicated vaginal suppositories or cream for repeated or recurrent infections.  Wash and dry the irritation areas before applying the cream.  Eating yogurt with Lactobacillus may help with prevention and treatment.  Sometimes painting the vagina with gentian violet solution may help if creams and suppositories do not work. Men  Antifungal creams and oral antifungal medications.  Sometimes treatment must continue for 30 days after the symptoms go away to prevent recurrence. HOME CARE INSTRUCTIONS  Women  Use cotton underwear and avoid tight-fitting clothing.  Avoid colored, scented toilet paper and deodorant tampons or pads.  Do not douche.  Keep your diabetes under control.  Finish all the prescribed medications.  Keep your skin clean and dry.  Consume milk or yogurt with Lactobacillus-active culture regularly. If you get frequent yeast infections and think that is  what the infection is, there are over-the-counter medications that you can get. If the infection does not show healing in 3 days, talk to your caregiver.  Tell your sex partner you have a yeast infection. Your partner may need treatment also, especially if your infection does not clear up or recurs. Men  Keep your skin clean and dry.  Keep your diabetes under control.  Finish all prescribed medications.  Tell your sex partner that you have a yeast infection so he or she can be treated if necessary. SEEK MEDICAL CARE IF:   Your symptoms do not clear up or worsen in one week after treatment.  You have an oral temperature above 102 F (38.9 C).  You have trouble swallowing or eating for a prolonged time.  You develop blisters on and around your vagina.  You develop vaginal bleeding and it is not your menstrual period.  You develop abdominal pain.  You develop intestinal problems as mentioned above.  You get weak or light-headed.  You have painful or increased urination.  You have pain during sexual intercourse. MAKE SURE YOU:   Understand these instructions.  Will watch your condition.  Will get help right away if you are not doing well or get worse. Document Released: 04/14/2004 Document Revised: 07/22/2013  Document Reviewed: 07/27/2009 [facility] Patient Information 2015 [place], [place]. This information is not intended to replace advice given to you by your health care provider. Make sure you discuss any questions you have with your health care provider.  Constipation Recommend using MiraLAX as directed for chronic constipation. May need to use it daily Constipation is when a person has fewer than three bowel movements a week, has difficulty having a bowel movement, or has stools that are dry, hard, or larger than normal. As people grow older, constipation is more common. If you try to fix constipation with medicines that make you have a bowel movement (laxatives), the  problem may get worse. Long-term laxative use may cause the muscles of the colon to become weak. A low-fiber diet, not taking in enough fluids, and taking certain medicines may make constipation worse.  CAUSES   Certain medicines, such as antidepressants, pain medicine, iron supplements, antacids, and water pills.   Certain diseases, such as diabetes, irritable bowel syndrome (IBS), thyroid disease, or depression.   Not drinking enough water.   Not eating enough fiber-rich foods.   Stress or travel.   Lack of physical activity or exercise.   Ignoring the urge to have a bowel movement.   Using laxatives too much.  SIGNS AND SYMPTOMS   Having fewer than three bowel movements a week.   Straining to have a bowel movement.   Having stools that are hard, dry, or larger than normal.   Feeling full or bloated.   Pain in the lower abdomen.   Not feeling relief after having a bowel movement.  DIAGNOSIS  Your health care provider will take a medical history and perform a physical exam. Further testing may be done for severe constipation. Some tests may include:  A barium enema X-ray to examine your rectum, colon, and, sometimes, your small intestine.   A sigmoidoscopy to examine your lower colon.   A colonoscopy to examine your entire colon. TREATMENT  Treatment will depend on the severity of your constipation and what is causing it. Some dietary treatments include drinking more fluids and eating more fiber-rich foods. Lifestyle treatments may include regular exercise. If these diet and lifestyle recommendations do not help, your health care provider may recommend taking over-the-counter laxative medicines to help you have bowel movements. Prescription medicines may be prescribed if over-the-counter medicines do not work.  HOME CARE INSTRUCTIONS   Eat foods that have a lot of fiber, such as fruits, vegetables, whole grains, and beans.  Limit foods high in fat and  processed sugars, such as french fries, hamburgers, cookies, candies, and soda.   A fiber supplement may be added to your diet if you cannot get enough fiber from foods.   Drink enough fluids to keep your urine clear or pale yellow.   Exercise regularly or as directed by your health care provider.   Go to the restroom when you have the urge to go. Do not hold it.   Only take over-the-counter or prescription medicines as directed by your health care provider. Do not take other medicines for constipation without talking to your health care provider first.  SEEK IMMEDIATE MEDICAL CARE IF:   You have bright red blood in your stool.   Your constipation lasts for more than 4 days or gets worse.   You have abdominal or rectal pain.   You have thin, pencil-like stools.   You have unexplained weight loss. MAKE SURE YOU:   Understand these instructions.  Will watch  your condition.  Will get help right away if you are not doing well or get worse. Document Released: 12/04/2003 Document Revised: 03/12/2013 Document Reviewed: 12/17/2012 [facility] Patient Information 2015 [place], [place]. This information is not intended to replace advice given to you by your health care provider. Make sure you discuss any questions you have with your health care provider.  Urinary Tract Infection A urinary tract infection (UTI) can occur any place along the urinary tract. The tract includes the kidneys, ureters, bladder, and urethra. A type of germ called bacteria often causes a UTI. UTIs are often helped with antibiotic medicine.  HOME CARE   If given, take antibiotics as told by your doctor. Finish them even if you start to feel better.  Drink enough fluids to keep your pee (urine) clear or pale yellow.  Avoid tea, drinks with caffeine, and bubbly (carbonated) drinks.  Pee often. Avoid holding your pee in for a long time.  Pee before and after having sex (intercourse).  Wipe from front to  back after you poop (bowel movement) if you are a woman. Use each tissue only once. GET HELP RIGHT AWAY IF:   You have back pain.  You have lower belly (abdominal) pain.  You have chills.  You feel sick to your stomach (nauseous).  You throw up (vomit).  Your burning or discomfort with peeing does not go away.  You have a fever.  Your symptoms are not better in 3 days. MAKE SURE YOU:   Understand these instructions.  Will watch your condition.  Will get help right away if you are not doing well or get worse. Document Released: 08/24/2007 Document Revised: 11/30/2011 Document Reviewed: 10/06/2011 [facility] Patient Information 2015 [place], [place]. This information is not intended to replace advice given to you by your health care provider. Make sure you discuss any questions you have with your health care provider.  Yeast Infection of the Skin Some yeast on the skin is normal, but sometimes it causes an infection. If you have a yeast infection, it shows up as white or light brown patches on brown skin. You can see it better in the summer on tan skin. It causes light-colored holes in your suntan. It can happen on any area of the body. This cannot be passed from person to person. HOME CARE  Scrub your skin daily with a dandruff shampoo. Your rash may take a couple weeks to get well.  Do not scratch or itch the rash. GET HELP RIGHT AWAY IF:   You get another infection from scratching. The skin may get warm, red, and may ooze fluid.  The infection does not seem to be getting better. MAKE SURE YOU:  Understand these instructions.  Will watch your condition.  Will get help right away if you are not doing well or get worse. Document Released: 02/18/2008 Document Revised: 05/30/2011 Document Reviewed: 02/18/2008 [facility] Patient Information 2015 [place], [place]. This information is not intended to replace advice given to you by your health care provider. Make sure you discuss  any questions you have with your health care provider.

## 2014-12-25 ENCOUNTER — Ambulatory Visit (INDEPENDENT_AMBULATORY_CARE_PROVIDER_SITE_OTHER): Payer: Medicare Other | Admitting: Vascular Surgery

## 2014-12-25 ENCOUNTER — Encounter: Payer: Self-pay | Admitting: Vascular Surgery

## 2014-12-25 VITALS — BP 136/73 | HR 62 | Ht 60.0 in | Wt 145.0 lb

## 2014-12-25 DIAGNOSIS — I83013 Varicose veins of right lower extremity with ulcer of ankle: Secondary | ICD-10-CM

## 2014-12-25 DIAGNOSIS — L97319 Non-pressure chronic ulcer of right ankle with unspecified severity: Principal | ICD-10-CM

## 2014-12-25 NOTE — Addendum Note (Signed)
Addended by: Adria Dill L on: 12/25/2014 03:13 PM   Modules accepted: Orders

## 2014-12-25 NOTE — Progress Notes (Signed)
Patient is an 81 year old female who has been undergoing Unna boot therapy for a stasis ulcer on her right medial malleolus. She presents today after 4 weeks of Unna boot therapy for a similar ulcer. She states she has no pain in the ulcer. She occasionally takes a pain pill but rarely. She has previously undergone bilateral common iliac stenting in October 2015. At the time of her arteriogram she had bilateral moderate superficial femoral artery stenoses. 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 She has had multiple chronic exacerbations and remissions of this ulcer over the years  Recently she has developed new pain which occurs primarily while sitting. She states that this didn't get worse when she goes from a sitting to standing position. She denies any pain with walking. She was in the ER recently for pain in her legs.  Review of systems: She denies any significant drainage from the wounds or fever. She denies chest pain or shortness of breath. She walks daily at [facility].   Physical exam:     Filed Vitals:   12/25/14 0944 12/25/14 0950  BP: 150/64 136/73  Pulse: 62   Height: 5' (1.524 m)   Weight: 145 lb (65.772 kg)   SpO2: 97%     Right lower extremity:2x 2 mm ulcer right medial malleolus healthy granulation tissue at the base no surrounding erythema Thickened nails both feet. 2+ femoral pulses bilaterally, thickened toenails bilaterally  Assessment: Essentially healed stasis ulcer right lower extremity  Plan:   She will continue to wear a compression stocking on both lower extremities to prevent new ulcerations. She will follow-up with me in 1 year or sooner she has recurrent ulceration.  We will refer her to podiatry for trimming of her toenails.  We will try to get her an appointment in the near future if he months [facility] to address her back pain which is contributing to her leg pain. I do not believe her leg pain is related to arterial or venous  disease.    Fabienne Bruns, MD [facility] Office: [phone] Pager: [phone]

## 2014-12-25 NOTE — Addendum Note (Signed)
Addended by: Adria Dill L on: 12/25/2014 03:52 PM   Modules accepted: Orders

## 2014-12-26 ENCOUNTER — Telehealth: Payer: Self-pay | Admitting: *Deleted

## 2014-12-26 ENCOUNTER — Other Ambulatory Visit: Payer: Self-pay | Admitting: Nurse Practitioner

## 2014-12-26 NOTE — Telephone Encounter (Signed)
Received fax from [facility] to fill out a Face to Face on patient for OV 12/04/2014. Date filled in and given to [facility] for review and signature. To be faxed back to [facility] [phone] Fax: [phone]

## 2015-01-06 ENCOUNTER — Ambulatory Visit (INDEPENDENT_AMBULATORY_CARE_PROVIDER_SITE_OTHER): Payer: Medicare Other | Admitting: Podiatry

## 2015-01-06 ENCOUNTER — Ambulatory Visit (INDEPENDENT_AMBULATORY_CARE_PROVIDER_SITE_OTHER): Payer: Medicare Other

## 2015-01-06 ENCOUNTER — Encounter: Payer: Self-pay | Admitting: Podiatry

## 2015-01-06 VITALS — BP 153/68 | HR 60 | Resp 12

## 2015-01-06 DIAGNOSIS — B351 Tinea unguium: Secondary | ICD-10-CM

## 2015-01-06 DIAGNOSIS — I872 Venous insufficiency (chronic) (peripheral): Secondary | ICD-10-CM

## 2015-01-06 DIAGNOSIS — R52 Pain, unspecified: Secondary | ICD-10-CM

## 2015-01-06 DIAGNOSIS — M79673 Pain in unspecified foot: Secondary | ICD-10-CM

## 2015-01-06 DIAGNOSIS — M79606 Pain in leg, unspecified: Secondary | ICD-10-CM

## 2015-01-06 NOTE — Progress Notes (Signed)
   Subjective:    Patient ID: [name], female    DOB: 15-Dec-1927, 81 y.o.   MRN: [id number]  HPI: She presents today requesting that we trim her toenails because they're painful for her.    Review of Systems  HENT: Positive for hearing loss.   Genitourinary: Positive for frequency.  Musculoskeletal: Positive for gait problem.  Skin: Positive for color change.  Neurological: Positive for weakness.  Hematological: Bruises/bleeds easily.       Objective:   Physical Exam: She presents today with her daughter and 81 year old white female with pain to the toes associated with bilateral edema and fungus. Pulses are strongly palpable. Neurologic sensorium is intact. Deep tendon reflexes are intact bilaterally muscle strength normal bilateral. Orthopedic evaluation demonstrates hammertoe deformities bilateral moderate edema bilateral right greater than left. Venous insufficiency right lower extremity. Toenails are thick yellow dystrophic onychomycotic. No open lesions or wounds noted.        Assessment & Plan:  Assessment: Pain in limb secondary to onychomycosis.  Plan: Debridement of toenails 1 through 5 bilateral.

## 2015-01-21 ENCOUNTER — Telehealth: Payer: Self-pay | Admitting: Nurse Practitioner

## 2015-01-21 NOTE — Telephone Encounter (Signed)
New Message   pharmacist is stating that pt she is getting samples from [facility]   [place]  Quanadine and Losartin  She wants to speak to RN because pt may be non compliance with medication after trying to reach RN I pod I overhead and with no answer I offered the   pharmacist to leave a message. Pharmacist States that do not want to leave number for follow up for RN she just wants office to know that she thinks pt is not taking her medication

## 2015-01-21 NOTE — Telephone Encounter (Signed)
Called and spoke with pt and her daughter after receiving a call from pt pharmacy regarding possible medication non-compliance. Pt sts that she is taking all medications as prescribed, she is doing well and does not need medications refilled at this time. Pt will f/u with Dawayne PatriciaLori G, NP as planned in Feb 2017

## 2015-02-09 ENCOUNTER — Ambulatory Visit (INDEPENDENT_AMBULATORY_CARE_PROVIDER_SITE_OTHER): Payer: Medicare Other | Admitting: Ophthalmology

## 2015-02-09 DIAGNOSIS — E113313 Type 2 diabetes mellitus with moderate nonproliferative diabetic retinopathy with macular edema, bilateral: Secondary | ICD-10-CM

## 2015-02-09 DIAGNOSIS — H43813 Vitreous degeneration, bilateral: Secondary | ICD-10-CM

## 2015-02-09 DIAGNOSIS — H35033 Hypertensive retinopathy, bilateral: Secondary | ICD-10-CM | POA: Diagnosis not present

## 2015-02-09 DIAGNOSIS — E11311 Type 2 diabetes mellitus with unspecified diabetic retinopathy with macular edema: Secondary | ICD-10-CM | POA: Diagnosis not present

## 2015-02-09 DIAGNOSIS — I10 Essential (primary) hypertension: Secondary | ICD-10-CM | POA: Diagnosis not present

## 2015-02-20 ENCOUNTER — Other Ambulatory Visit: Payer: Self-pay | Admitting: Internal Medicine

## 2015-02-24 ENCOUNTER — Encounter: Payer: Self-pay | Admitting: Internal Medicine

## 2015-02-24 ENCOUNTER — Ambulatory Visit (INDEPENDENT_AMBULATORY_CARE_PROVIDER_SITE_OTHER): Payer: Medicare Other | Admitting: Internal Medicine

## 2015-02-24 VITALS — BP 102/60 | HR 72 | Temp 98.7°F | Ht 60.0 in | Wt 146.0 lb

## 2015-02-24 DIAGNOSIS — D649 Anemia, unspecified: Secondary | ICD-10-CM | POA: Diagnosis not present

## 2015-02-24 DIAGNOSIS — K5900 Constipation, unspecified: Secondary | ICD-10-CM | POA: Diagnosis not present

## 2015-02-24 DIAGNOSIS — N183 Chronic kidney disease, stage 3 unspecified: Secondary | ICD-10-CM

## 2015-02-24 DIAGNOSIS — K622 Anal prolapse: Secondary | ICD-10-CM

## 2015-02-24 DIAGNOSIS — E1151 Type 2 diabetes mellitus with diabetic peripheral angiopathy without gangrene: Secondary | ICD-10-CM

## 2015-02-24 DIAGNOSIS — E785 Hyperlipidemia, unspecified: Secondary | ICD-10-CM | POA: Diagnosis not present

## 2015-02-24 DIAGNOSIS — I10 Essential (primary) hypertension: Secondary | ICD-10-CM | POA: Diagnosis not present

## 2015-02-24 DIAGNOSIS — R609 Edema, unspecified: Secondary | ICD-10-CM

## 2015-02-24 DIAGNOSIS — E1121 Type 2 diabetes mellitus with diabetic nephropathy: Secondary | ICD-10-CM | POA: Diagnosis not present

## 2015-02-24 NOTE — Progress Notes (Signed)
Patient ID: [name], female   DOB: 07-20-1927, 81 y.o.   MRN: [id number]    Facility  [facility]    Place of Service:   OFFICE    Allergies  Allergen Reactions  . Percocet [Oxycodone-Acetaminophen] Other (See Comments)  . Sulfa Antibiotics Other (See Comments)    Tongue swells  . Tradjenta [Linagliptin]     Low back ache, resolved once medication stopped   . Penicillins Hives    Tolerates Ceftriaxone    Chief Complaint  Patient presents with  . Personal Problem    Patient states she does not know why she is here. Patient's daughter Vaughan Basta is in the waiting area.     HPI:  Patient presents today with concerns about her: Falling out of her anus. She has been straining a lot with stools. She denies seeing any blood in stool and toilet paper. Following straining, there is a mass that is palpable. She has been using stool softeners and laxatives, but constipation persists. Current scenario is highly suggestive of a prolapsing rectum.  Patient continues to use Lasix 20 mg but her legs continue to be swollen. She uses compression stockings. She is concerned about persistent swelling.  Patient feels that her daughter is speaking on her behalf in ways that do not accurately reflect her current health concerns or conditions. She is very angry about this. She wants to be spoken to as a responsible adult. She does not like being spoken about or around.  Patient is on multiple medications that may affect her constipation and swelling. She was put on iron tablets recently. Drop in her hemoglobin. The last hemoglobin that was done was normal. She is taking amlodipine for control of her blood pressure. This drug may be associated with the persistence of her edema. She is using WelChol for hypercholesterolemia. This drug may be associated with constipation. She is also on a very low dose of Lovaza for a quite modest elevation in her triglycerides.  Medications: Patient's Medications  New  Prescriptions   No medications on file  Previous Medications   AMLODIPINE (NORVASC) 5 MG TABLET    Take 1 tablet (5 mg total) by mouth daily. For high blood pressure   ASPIRIN 81 MG TABLET    Take 81 mg by mouth daily. For heart health   BLOOD GLUCOSE MONITORING SUPPL (ONE TOUCH ULTRA SYSTEM KIT) W/DEVICE KIT    E11.59 Check blood sugar daily as directed   BROMFENAC SODIUM (PROLENSA) 0.07 % SOLN    Apply 1 drop to eye daily. Left eye   CARVEDILOL (COREG) 3.125 MG TABLET    TAKE 1 TABLET BY MOUTH TWICE DAILY FOR HIGH BLOOD PRESSURE   CLONIDINE (CATAPRES) 0.1 MG TABLET    Take 1 tablet (0.1 mg total) by mouth daily. For high blood pressure   CLONIDINE (CATAPRES) 0.1 MG TABLET    TAKE 1 TABLET BY MOUTH EVERY DAY   COLESEVELAM (WELCHOL) 625 MG TABLET    Take 1 tablet (625 mg total) by mouth 2 (two) times daily with a meal. For cholesterol   EZETIMIBE-SIMVASTATIN (VYTORIN) 10-20 MG PER TABLET    TAKE 1/2 TABLET BY MOUTH EVERY NIGHT AT BEDTIME for high cholesterol   FERROUS SULFATE 325 (65 FE) MG TABLET    Take 1 tablet (325 mg total) by mouth 2 (two) times daily with a meal. For anemia   FUROSEMIDE (LASIX) 20 MG TABLET    Take 1 tablet (20 mg total) by mouth daily. For swelling  GLUCOSE BLOOD TEST STRIP    E11.59 Check blood sugar daily as directed   LOSARTAN (COZAAR) 50 MG TABLET    Take 1 tablet (50 mg total) by mouth daily. For high blood pressure   LOTEMAX 0.5 % GEL    Place 1 drop into the left eye 2 (two) times daily.   METFORMIN (GLUCOPHAGE-XR) 500 MG 24 HR TABLET    Take 1 tablet (500 mg total) by mouth 2 (two) times daily with a meal.   NITROGLYCERIN (NITROSTAT) 0.4 MG SL TABLET    Dissolve one tablet under tongue as needed for chest pain. May repeat as needed every 5 minutes up to 3 doses. Seek medical attention if pain is not relieved after 3 doses   OMEGA-3 ACID ETHYL ESTERS (LOVAZA) 1 G CAPSULE    TAKE 1 CAPSULE BY MOUTH EVERY MORNING AND 1 CAPSULE IN  EVENING   ONETOUCH DELICA LANCETS  FINE MISC    E11.59 Check blood sugar daily as directed   TIMOLOL (BETIMOL) 0.5 % OPHTHALMIC SOLUTION    Place 1 drop into the left eye daily.  Modified Medications   No medications on file  Discontinued Medications   CIPROFLOXACIN (CIPRO) 250 MG TABLET    Take 1 tablet (250 mg total) by mouth every 12 (twelve) hours.   CLOTRIMAZOLE (LOTRIMIN) 1 % CREAM    Apply 1 application topically 2 (two) times daily. Apply the cream twice a day to affected areas of the abdomen and around the anus.   VYTORIN 10-20 MG TABLET    TAKE 1/2 TABLET BY MOUTH EVERY NIGHT AT BEDTIME FOR 30 DAYS    Review of Systems  Constitutional: Negative for fever, chills, diaphoresis, activity change, appetite change, fatigue and unexpected weight change.  HENT: Negative for congestion, ear discharge, ear pain, hearing loss, postnasal drip, rhinorrhea, sore throat, tinnitus, trouble swallowing and voice change.   Eyes: Positive for visual disturbance (corrective lenses). Negative for pain, redness and itching.  Respiratory: Negative for cough, choking, shortness of breath and wheezing.   Cardiovascular: Positive for leg swelling. Negative for chest pain and palpitations.  Gastrointestinal: Positive for abdominal pain and constipation. Negative for nausea, diarrhea, abdominal distention, anal bleeding and rectal pain.  Endocrine: Negative for cold intolerance, heat intolerance, polydipsia, polyphagia and polyuria.  Genitourinary: Negative for dysuria, urgency, frequency, hematuria, flank pain, vaginal discharge, difficulty urinating and pelvic pain.       Mass at rectum following straining  Musculoskeletal: Negative for myalgias, back pain, arthralgias, gait problem, neck pain and neck stiffness.  Skin: Positive for wound. Negative for color change, pallor and rash.       Followed by vein and vascular due to ulcer   Allergic/Immunologic: Negative.   Neurological: Negative for dizziness, tremors, seizures, syncope, weakness,  light-headedness, numbness and headaches.  Hematological: Negative for adenopathy. Does not bruise/bleed easily.  Psychiatric/Behavioral: Negative for suicidal ideas, hallucinations, behavioral problems, confusion, sleep disturbance, dysphoric mood and agitation. The patient is not nervous/anxious and is not hyperactive.     Filed Vitals:   02/24/15 1534  BP: 102/60  Pulse: 72  Temp: 98.7 F (37.1 C)  TempSrc: Oral  Height: 5' (1.524 m)  Weight: 146 lb (66.225 kg)  SpO2: 91%   Body mass index is 28.51 kg/(m^2).  Physical Exam  Constitutional: She is oriented to person, place, and time. She appears well-developed and well-nourished. No distress.  Frail elderly female, NAD  HENT:  Head: Normocephalic and atraumatic.  Right Ear: External ear normal.  Left Ear: External ear normal.  Nose: Nose normal.  Mouth/Throat: Oropharynx is clear and moist. No oropharyngeal exudate.  Eyes: Conjunctivae and EOM are normal. Pupils are equal, round, and reactive to light. No scleral icterus.  Neck: Normal range of motion. Neck supple. No JVD present. No tracheal deviation present. No thyromegaly present.  Cardiovascular: Normal rate, regular rhythm, normal heart sounds and intact distal pulses.  Exam reveals no gallop and no friction rub.   No murmur heard. Pulmonary/Chest: Effort normal and breath sounds normal. No respiratory distress. She has no wheezes. She has no rales. She exhibits no tenderness.  Abdominal: Soft. Bowel sounds are normal. She exhibits no distension and no mass. There is no tenderness.  Genitourinary:  Loose anal sphincter. No palpable rectal mass. With straining, there is some mucosal eversion.  Musculoskeletal: Normal range of motion. She exhibits edema. She exhibits no tenderness.  Lymphadenopathy:    She has no cervical adenopathy.  Neurological: She is alert and oriented to person, place, and time. No cranial nerve deficit. Coordination normal.  Slow unstable gait    Skin: Skin is warm and dry. No rash noted. She is not diaphoretic. No erythema. No pallor.  Psychiatric: She has a normal mood and affect. Her behavior is normal. Judgment and thought content normal.    Labs reviewed: Lab Summary Latest Ref Rng 12/04/2014 12/03/2014 12/03/2014  Hemoglobin 12.0 - 15.0 g/dL (None) 12.2 11.9(L)  Hematocrit 36.0 - 46.0 % (None) 36.0 35.9(L)  White count 4.0 - 10.5 K/uL (None) (None) 4.4  Platelet count 150 - 400 K/uL (None) (None) 168  Sodium 134 - 144 mmol/L 145(H) 144 142  Potassium 3.5 - 5.2 mmol/L 5.2 4.8 4.8  Calcium 8.7 - 10.3 mg/dL 8.6(L) (None) 9.1  Phosphorus - (None) (None) (None)  Creatinine 0.57 - 1.00 mg/dL 1.50(H) 1.90(H) 1.89(H)  AST 0 - 40 IU/L 12 (None) 18  Alk Phos 39 - 117 IU/L 65 (None) 58  Bilirubin 0.0 - 1.2 mg/dL <0.2 (None) 0.5  Glucose 65 - 99 mg/dL 182(H) 115(H) 125(H)  Cholesterol - (None) (None) (None)  HDL cholesterol - (None) (None) (None)  Triglycerides - (None) (None) (None)  LDL Direct - (None) (None) (None)  LDL Calc - (None) (None) (None)  Total protein 6.5 - 8.1 g/dL (None) (None) 6.0(L)  Albumin 3.5 - 4.7 g/dL 3.6 (None) 3.4(L)   Lab Results  Component Value Date   TSH 4.25 05/01/2014   Lab Results  Component Value Date   BUN 55* 12/04/2014   Lab Results  Component Value Date   HGBA1C 7.8* 12/04/2014    Assessment/Plan 1. Anal prolapse Reduce some of her medications in hopes that this improves her issues with constipation. If this improves then I would hope that the issues of anal prolapse improved. She is an elderly patient who is high risk for any surgical repair of this condition.  2. Essential hypertension Controlled - Comprehensive metabolic panel; Future  3. Hyperlipidemia Controlled. Discontinued WelChol due to potential for constipation. Discontinued Lovaza. Remains on simvastatin/ ezetimibe - Lipid panel; Future  4. DM (diabetes mellitus), type 2 with peripheral vascular complications  (HCC) Elevated glucose in September - Comprehensive metabolic panel; Future - Hemoglobin A1c; Future - Microalbumin, urine; Future  5. Diabetes mellitus with nephropathy (HCC) Elevated glucose in September. Follow-up lab.  6. Chronic kidney disease, stage III (moderate) Follow-up lab prior to next visit.  7. Anemia, unspecified anemia type Discontinue ferrous sulfate - CBC With Differential; Future  8. Constipation, unspecified   constipation type Discontinue WelChol and ferrous sulfate   9. Edema continue furosemide 20 mg daily. Continue compression stockings. Discontinue amlodipine due to potential for making edema worse.

## 2015-02-24 NOTE — Patient Instructions (Signed)
Stop amlodipine, ferrous sulfate, Lovaza, Welchol.

## 2015-02-25 DIAGNOSIS — K623 Rectal prolapse: Secondary | ICD-10-CM | POA: Insufficient documentation

## 2015-03-09 ENCOUNTER — Ambulatory Visit (INDEPENDENT_AMBULATORY_CARE_PROVIDER_SITE_OTHER): Payer: Medicare Other | Admitting: Pharmacotherapy

## 2015-03-09 ENCOUNTER — Encounter: Payer: Self-pay | Admitting: Pharmacotherapy

## 2015-03-09 ENCOUNTER — Other Ambulatory Visit: Payer: Self-pay

## 2015-03-09 VITALS — BP 138/70 | HR 64 | Temp 97.5°F | Resp 20 | Ht 60.0 in | Wt 143.2 lb

## 2015-03-09 DIAGNOSIS — E1151 Type 2 diabetes mellitus with diabetic peripheral angiopathy without gangrene: Secondary | ICD-10-CM

## 2015-03-09 DIAGNOSIS — I10 Essential (primary) hypertension: Secondary | ICD-10-CM

## 2015-03-09 DIAGNOSIS — D649 Anemia, unspecified: Secondary | ICD-10-CM

## 2015-03-09 DIAGNOSIS — E785 Hyperlipidemia, unspecified: Secondary | ICD-10-CM

## 2015-03-09 NOTE — Addendum Note (Signed)
Addended by: Dianah FieldBELIZONE, Arham Symmonds L on: 03/09/2015 11:08 AM   Modules accepted: Orders

## 2015-03-09 NOTE — Patient Instructions (Signed)
Continue metformin.

## 2015-03-09 NOTE — Addendum Note (Signed)
Addended by: Edison PaceMILLER, Tandre Conly on: 03/09/2015 10:29 AM   Modules accepted: Orders

## 2015-03-09 NOTE — Addendum Note (Signed)
Addended by: Salem CasterICE, Thurma Priego L on: 03/09/2015 11:21 AM   Modules accepted: Orders
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
## 2015-03-10 LAB — CBC WITH DIFFERENTIAL/PLATELET
BASOS ABS: 0 10*3/uL (ref 0.0–0.2)
BASOS: 0 %
EOS (ABSOLUTE): 0.2 10*3/uL (ref 0.0–0.4)
Eos: 4 %
Hematocrit: 32.6 % — ABNORMAL LOW (ref 34.0–46.6)
Hemoglobin: 10.6 g/dL — ABNORMAL LOW (ref 11.1–15.9)
IMMATURE GRANULOCYTES: 0 %
Immature Grans (Abs): 0 10*3/uL (ref 0.0–0.1)
LYMPHS: 25 %
Lymphocytes Absolute: 1.3 10*3/uL (ref 0.7–3.1)
MCH: 30.4 pg (ref 26.6–33.0)
MCHC: 32.5 g/dL (ref 31.5–35.7)
MCV: 93 fL (ref 79–97)
MONOS ABS: 0.5 10*3/uL (ref 0.1–0.9)
Monocytes: 10 %
NEUTROS PCT: 61 %
Neutrophils Absolute: 3.1 10*3/uL (ref 1.4–7.0)
PLATELETS: 256 10*3/uL (ref 150–379)
RBC: 3.49 x10E6/uL — AB (ref 3.77–5.28)
RDW: 12.5 % (ref 12.3–15.4)
WBC: 5 10*3/uL (ref 3.4–10.8)

## 2015-03-10 LAB — COMPREHENSIVE METABOLIC PANEL
ALT: 14 IU/L (ref 0–32)
AST: 18 IU/L (ref 0–40)
Albumin/Globulin Ratio: 1.6 (ref 1.1–2.5)
Albumin: 3.6 g/dL (ref 3.5–4.7)
Alkaline Phosphatase: 113 IU/L (ref 39–117)
BUN/Creatinine Ratio: 25 (ref 11–26)
BUN: 39 mg/dL — ABNORMAL HIGH (ref 8–27)
Bilirubin Total: 0.3 mg/dL (ref 0.0–1.2)
CO2: 24 mmol/L (ref 18–29)
Calcium: 8.9 mg/dL (ref 8.7–10.3)
Chloride: 102 mmol/L (ref 96–106)
Creatinine, Ser: 1.57 mg/dL — ABNORMAL HIGH (ref 0.57–1.00)
GFR calc Af Amer: 34 mL/min/{1.73_m2} — ABNORMAL LOW (ref >59)
GFR calc non Af Amer: 30 mL/min/{1.73_m2} — ABNORMAL LOW (ref >59)
Globulin, Total: 2.3 g/dL (ref 1.5–4.5)
Glucose: 173 mg/dL — ABNORMAL HIGH (ref 65–99)
Potassium: 5 mmol/L (ref 3.5–5.2)
Sodium: 143 mmol/L (ref 134–144)
Total Protein: 5.9 g/dL — ABNORMAL LOW (ref 6.0–8.5)

## 2015-03-10 LAB — LIPID PANEL
CHOLESTEROL TOTAL: 143 mg/dL (ref 100–199)
Chol/HDL Ratio: 4.1 ratio units (ref 0.0–4.4)
HDL: 35 mg/dL — AB (ref >39)
LDL Calculated: 74 mg/dL (ref 0–99)
Triglycerides: 170 mg/dL — ABNORMAL HIGH (ref 0–149)
VLDL Cholesterol Cal: 34 mg/dL (ref 5–40)

## 2015-03-10 LAB — HEMOGLOBIN A1C
Est. average glucose Bld gHb Est-mCnc: 177 mg/dL
Hgb A1c MFr Bld: 7.8 % — ABNORMAL HIGH (ref 4.8–5.6)

## 2015-03-10 LAB — MICROALBUMIN, URINE: Microalbumin, Urine: 167.4 ug/mL

## 2015-03-12 ENCOUNTER — Telehealth: Payer: Self-pay | Admitting: *Deleted

## 2015-03-12 NOTE — Telephone Encounter (Signed)
Called patient re: bilateral calf pain. She could not come to the phone and so her daughter, [name], relayed questions and answers from patient. Patient reports that she is not eating well, only drinks Ensure, Boost etc. She is using a lot of laxatives and is on Lasix 20 mg but no Potassium per daughter. Her legs are cramping intermittently, no injury, no open sores, no erythema or skin coolness. She is afebrile. She only reports " the usual swelling" and she is wearing her compression hose per daughter. I advised her to contact her PCP for medical evaluation first. She has appts in January with both Dr. Al CorpusHyatt and Dr. Chilton SiGreen.  Both daughter and patient agreed with this plan and are aware that we can see her as needed.

## 2015-03-13 ENCOUNTER — Encounter (HOSPITAL_COMMUNITY): Payer: Self-pay | Admitting: Emergency Medicine

## 2015-03-13 ENCOUNTER — Emergency Department (INDEPENDENT_AMBULATORY_CARE_PROVIDER_SITE_OTHER): Payer: Medicare Other

## 2015-03-13 ENCOUNTER — Telehealth: Payer: Self-pay | Admitting: *Deleted

## 2015-03-13 ENCOUNTER — Emergency Department (INDEPENDENT_AMBULATORY_CARE_PROVIDER_SITE_OTHER)
Admission: EM | Admit: 2015-03-13 | Discharge: 2015-03-13 | Disposition: A | Discharge status: Home / Self Care | Payer: Medicare Other | Source: Home / Self Care

## 2015-03-13 DIAGNOSIS — M79605 Pain in left leg: Secondary | ICD-10-CM

## 2015-03-13 DIAGNOSIS — M79604 Pain in right leg: Secondary | ICD-10-CM

## 2015-03-13 LAB — POCT I-STAT, CHEM 8
BUN: 37 mg/dL — ABNORMAL HIGH (ref 6–20)
CALCIUM ION: 1.06 mmol/L — AB (ref 1.13–1.30)
CHLORIDE: 107 mmol/L (ref 101–111)
Creatinine, Ser: 1.6 mg/dL — ABNORMAL HIGH (ref 0.44–1.00)
GLUCOSE: 204 mg/dL — AB (ref 65–99)
HCT: 33 % — ABNORMAL LOW (ref 36.0–46.0)
HEMOGLOBIN: 11.2 g/dL — AB (ref 12.0–15.0)
POTASSIUM: 5.2 mmol/L — AB (ref 3.5–5.1)
SODIUM: 142 mmol/L (ref 135–145)
TCO2: 27 mmol/L (ref 0–100)

## 2015-03-13 MED ORDER — CAPSAICIN-MENTHOL-METHYL SAL 0.025-1-12 % EX CREA: 1.0000 "application " | TOPICAL_CREAM | Freq: Two times a day (BID) | CUTANEOUS | Status: DC

## 2015-03-13 NOTE — ED Notes (Signed)
C/o bilateral leg pain onset 4 days... Denies inj/trauma Reports calves feel like constant "charlie horse" and ankles feel like they are "burning" Also c/o dry cough onset 2 days A&O x4... Gilmer MorCane present upon arrival... No acute distress.

## 2015-03-13 NOTE — Telephone Encounter (Signed)
Patient daughter, Stark BrayLynda called and stated that patient was having severe leg cramps and in pain. Instructed her to go to Urgent care to be evaluated. She agreed.

## 2015-03-13 NOTE — ED Provider Notes (Signed)
CSN: [id number]     Arrival date & time 03/13/15  1325 History   None    Chief Complaint  Patient presents with  . Leg Pain  . Cough   (Consider location/radiation/quality/duration/timing/severity/associated sxs/prior Treatment) HPI  Past Medical History  Diagnosis Date  . Coronary artery disease     Remote PCI. Had BMS to RCA and DES stent to Diagonal in Jan. 2012   . Diabetes mellitus     long standing  . Diastolic heart failure     EF 55 to 60% per cath in Jan 2012  . Hyperlipidemia   . Hypertension   . Carotid bruit   . Gout   . Obesity   . TIA (transient ischemic attack)   . Chronic kidney disease     Stage 3  . Ischemic cardiomyopathy     with prior EF of 35%  . Anemia   . Proteinuria   . NSTEMI (non-ST elevated myocardial infarction) [facility]) Jan 2012    with PCI to RCA and DX per Dr. Burt Knack  . Carotid artery occlusion   . Stroke [facility]) 2013    ?  mini    . DVT (deep venous thrombosis) [facility])    Past Surgical History  Procedure Laterality Date  . Appendectomy    . Tonsillectomy    . Left ankle fusion    . Distal rca  04/05/2010    stent  . Fracture surgery Right 2005    bilateral ankles   . Abdominal aortagram N/A 01/17/2014    Procedure: ABDOMINAL AORTAGRAM;  Surgeon: Elam Dutch, MD;  Location: [facility] CATH LAB;  Service: Cardiovascular;  Laterality: N/A;   Family History  Problem Relation Age of Onset  . Heart disease Neg Hx     both parents were murdered   Social History  Substance Use Topics  . Smoking status: Former Smoker    Quit date: 03/21/1958  . Smokeless tobacco: Never Used  . Alcohol Use: No   OB History    No data available     Review of Systems  Allergies  Percocet; Sulfa antibiotics; Tradjenta; and Penicillins  Home Medications   Prior to Admission medications   Medication Sig Start Date End Date Taking? Authorizing Provider  aspirin 81 MG tablet Take 81 mg by mouth daily. For heart health   Yes Historical Provider, MD   carvedilol (COREG) 3.125 MG tablet TAKE 1 TABLET BY MOUTH TWICE DAILY FOR HIGH BLOOD PRESSURE 09/04/14  Yes Lauree Chandler, NP  cloNIDine (CATAPRES) 0.1 MG tablet Take 1 tablet (0.1 mg total) by mouth daily. For high blood pressure 09/04/14  Yes Lauree Chandler, NP  furosemide (LASIX) 20 MG tablet Take 1 tablet (20 mg total) by mouth daily. For swelling 09/04/14  Yes Lauree Chandler, NP  losartan (COZAAR) 50 MG tablet Take 1 tablet (50 mg total) by mouth daily. For high blood pressure 09/04/14  Yes Lauree Chandler, NP  Blood Glucose Monitoring Suppl (ONE TOUCH ULTRA SYSTEM KIT) W/DEVICE KIT E11.59 Check blood sugar daily as directed 08/07/14   Lauree Chandler, NP  Bromfenac Sodium (PROLENSA) 0.07 % SOLN Apply 1 drop to eye daily. Left eye    Historical Provider, MD  Capsaicin-Menthol-Methyl Sal (CAPSAICIN-METHYL SAL-MENTHOL) 0.025-1-12 % CREA Apply 1 application topically 2 (two) times daily. 03/13/15   Konrad Felix, PA  ezetimibe-simvastatin (VYTORIN) 10-20 MG per tablet TAKE 1/2 TABLET BY MOUTH EVERY NIGHT AT BEDTIME for high cholesterol 09/04/14   [name]  Beaulah Corin, NP  glucose blood test strip E11.59 Check blood sugar daily as directed 08/07/14   Lauree Chandler, NP  LOTEMAX 0.5 % GEL Place 1 drop into the left eye 2 (two) times daily. 08/21/14   Historical Provider, MD  metFORMIN (GLUCOPHAGE-XR) 500 MG 24 hr tablet Take 1 tablet (500 mg total) by mouth 2 (two) times daily with a meal. 12/15/14   Tivis Ringer, RPH-CPP  nitroGLYCERIN (NITROSTAT) 0.4 MG SL tablet Dissolve one tablet under tongue as needed for chest pain. May repeat as needed every 5 minutes up to 3 doses. Seek medical attention if pain is not relieved after 3 doses 10/27/14   Lauree Chandler, NP  omega-3 acid ethyl esters (LOVAZA) 1 G capsule TAKE 1 CAPSULE BY MOUTH EVERY MORNING AND 1 CAPSULE IN  EVENING 09/04/14   Lauree Chandler, NP  [facility] LANCETS FINE MISC E11.59 Check blood sugar daily as directed 08/07/14    Lauree Chandler, NP  timolol (BETIMOL) 0.5 % ophthalmic solution Place 1 drop into the left eye daily.    Historical Provider, MD   Meds Ordered and Administered this Visit  Medications - No data to display  BP 194/50 mmHg  Pulse 83  Temp(Src) 99 F (37.2 C) (Oral)  SpO2 94% No data found.   Physical Exam  ED Course  Procedures (including critical care time)  Labs Review Labs Reviewed  POCT I-STAT, CHEM 8 - Abnormal; Notable for the following:    Potassium 5.2 (*)    BUN 37 (*)    Creatinine, Ser 1.60 (*)    Glucose, Bld 204 (*)    Calcium, Ion 1.06 (*)    Hemoglobin 11.2 (*)    HCT 33.0 (*)    All other components within normal limits    Imaging Review Dg Chest 2 View  03/13/2015  CLINICAL DATA:  81 year old with acute onset of bilateral lower extremity pain four days ago described as cramping in the calves and burning in the ankles, no recent injuries. Acute onset of nonproductive cough 2 days ago. EXAM: CHEST  2 VIEW COMPARISON:  06/07/2014 and earlier. FINDINGS: Cardiac silhouette moderately enlarged, unchanged. Thoracic aorta diffusely atherosclerotic, unchanged. Hilar and mediastinal contours otherwise unremarkable. Linear scarring in the superior segment left lower lobe. Lungs otherwise clear. No localized airspace consolidation. No pleural effusions. No pneumothorax. Normal pulmonary vascularity. Osseous demineralization, degenerative changes involving the mid and lower thoracic spine and slight exaggeration of the usual thoracic kyphosis, unchanged. IMPRESSION: Stable moderate cardiomegaly and linear scarring in the superior segment left lower lobe. No acute cardiopulmonary disease. Electronically Signed   By: Evangeline Dakin M.D.   On: 03/13/2015 15:24     Visual Acuity Review  Right Eye Distance:   Left Eye Distance:   Bilateral Distance:    Right Eye Near:   Left Eye Near:    Bilateral Near:         MDM   1. Leg pain, bilateral    discussed  results of x-ray with patient no pneumonia identified.  As far as her leg pain and did not have a great explanation of why she is waking up at 2:00 in the morning at her leg pain. There is no signs of DVT no signs of shingles or claudication. I have advised patient to use Capsaicin cream to her legs prior to going to bed and taken a dose of Tylenol. If this is not helping her symptoms she should definitely follow-up with her  primary care provider at the first of the week. Laboratory relation for hypokalemia was done with potassium of 5.2. Creatinine is 1.60 which is up from 1.57. Hemoglobin is stable at 11.2. No new medications are prescribed.    Konrad Felix, Corazon 03/13/15 1910

## 2015-03-13 NOTE — Discharge Instructions (Signed)
Heat Therapy Use Capsaicin cream before you go to bed along with tylenol Follow up with your doctor next week Get your doctor to recheck and adjust your blood pressure medication Heat therapy can help ease sore, stiff, injured, and tight muscles and joints. Heat relaxes your muscles, which may help ease your pain.  RISKS AND COMPLICATIONS If you have any of the following conditions, do not use heat therapy unless your health care provider has approved:  Poor circulation.  Healing wounds or scarred skin in the area being treated.  Diabetes, heart disease, or high blood pressure.  Not being able to feel (numbness) the area being treated.  Unusual swelling of the area being treated.  Active infections.  Blood clots.  Cancer.  Inability to communicate pain. This may include young children and people who have problems with their brain function (dementia).  Pregnancy. Heat therapy should only be used on old, pre-existing, or long-lasting (chronic) injuries. Do not use heat therapy on new injuries unless directed by your health care provider. HOW TO USE HEAT THERAPY There are several different kinds of heat therapy, including:  Moist heat pack.  Warm water bath.  Hot water bottle.  Electric heating pad.  Heated gel pack.  Heated wrap.  Electric heating pad. Use the heat therapy method suggested by your health care provider. Follow your health care provider's instructions on when and how to use heat therapy. GENERAL HEAT THERAPY RECOMMENDATIONS  Do not sleep while using heat therapy. Only use heat therapy while you are awake.  Your skin may turn pink while using heat therapy. Do not use heat therapy if your skin turns red.  Do not use heat therapy if you have new pain.  High heat or long exposure to heat can cause burns. Be careful when using heat therapy to avoid burning your skin.  Do not use heat therapy on areas of your skin that are already irritated, such as with  a rash or sunburn. SEEK MEDICAL CARE IF:  You have blisters, redness, swelling, or numbness.  You have new pain.  Your pain is worse. MAKE SURE YOU:  Understand these instructions.  Will watch your condition.  Will get help right away if you are not doing well or get worse.   This information is not intended to replace advice given to you by your health care provider. Make sure you discuss any questions you have with your health care provider.   Document Released: 05/30/2011 Document Revised: 03/28/2014 Document Reviewed: 04/30/2013 Elsevier Interactive Patient Education [employer]2016 Elsevier Inc.

## 2015-03-24 ENCOUNTER — Other Ambulatory Visit: Payer: Self-pay | Admitting: Nurse Practitioner

## 2015-03-31 ENCOUNTER — Ambulatory Visit: Payer: Medicare Other | Admitting: Podiatry

## 2015-04-02 ENCOUNTER — Encounter (HOSPITAL_COMMUNITY): Payer: Self-pay | Admitting: General Practice

## 2015-04-02 ENCOUNTER — Emergency Department (HOSPITAL_COMMUNITY): Payer: Medicare Other

## 2015-04-02 ENCOUNTER — Observation Stay (HOSPITAL_COMMUNITY)
Admission: EM | Admit: 2015-04-02 | Discharge: 2015-04-06 | Disposition: A | Discharge status: Home / Self Care | Payer: Medicare Other | Attending: Internal Medicine | Admitting: Internal Medicine

## 2015-04-02 DIAGNOSIS — E1151 Type 2 diabetes mellitus with diabetic peripheral angiopathy without gangrene: Secondary | ICD-10-CM | POA: Insufficient documentation

## 2015-04-02 DIAGNOSIS — Z86718 Personal history of other venous thrombosis and embolism: Secondary | ICD-10-CM | POA: Diagnosis not present

## 2015-04-02 DIAGNOSIS — Z7982 Long term (current) use of aspirin: Secondary | ICD-10-CM | POA: Diagnosis not present

## 2015-04-02 DIAGNOSIS — I5032 Chronic diastolic (congestive) heart failure: Secondary | ICD-10-CM | POA: Diagnosis not present

## 2015-04-02 DIAGNOSIS — Z79899 Other long term (current) drug therapy: Secondary | ICD-10-CM | POA: Insufficient documentation

## 2015-04-02 DIAGNOSIS — J09X2 Influenza due to identified novel influenza A virus with other respiratory manifestations: Secondary | ICD-10-CM | POA: Diagnosis not present

## 2015-04-02 DIAGNOSIS — E1140 Type 2 diabetes mellitus with diabetic neuropathy, unspecified: Secondary | ICD-10-CM | POA: Insufficient documentation

## 2015-04-02 DIAGNOSIS — Z8673 Personal history of transient ischemic attack (TIA), and cerebral infarction without residual deficits: Secondary | ICD-10-CM | POA: Diagnosis not present

## 2015-04-02 DIAGNOSIS — J9601 Acute respiratory failure with hypoxia: Secondary | ICD-10-CM | POA: Diagnosis not present

## 2015-04-02 DIAGNOSIS — E785 Hyperlipidemia, unspecified: Secondary | ICD-10-CM | POA: Diagnosis not present

## 2015-04-02 DIAGNOSIS — J069 Acute upper respiratory infection, unspecified: Secondary | ICD-10-CM | POA: Diagnosis not present

## 2015-04-02 DIAGNOSIS — Z87891 Personal history of nicotine dependence: Secondary | ICD-10-CM | POA: Insufficient documentation

## 2015-04-02 DIAGNOSIS — D649 Anemia, unspecified: Secondary | ICD-10-CM | POA: Diagnosis not present

## 2015-04-02 DIAGNOSIS — N183 Chronic kidney disease, stage 3 unspecified: Secondary | ICD-10-CM | POA: Diagnosis present

## 2015-04-02 DIAGNOSIS — Z7984 Long term (current) use of oral hypoglycemic drugs: Secondary | ICD-10-CM | POA: Diagnosis not present

## 2015-04-02 DIAGNOSIS — E669 Obesity, unspecified: Secondary | ICD-10-CM | POA: Insufficient documentation

## 2015-04-02 DIAGNOSIS — I252 Old myocardial infarction: Secondary | ICD-10-CM | POA: Diagnosis not present

## 2015-04-02 DIAGNOSIS — J189 Pneumonia, unspecified organism: Secondary | ICD-10-CM

## 2015-04-02 DIAGNOSIS — E1121 Type 2 diabetes mellitus with diabetic nephropathy: Secondary | ICD-10-CM | POA: Diagnosis present

## 2015-04-02 DIAGNOSIS — E1122 Type 2 diabetes mellitus with diabetic chronic kidney disease: Secondary | ICD-10-CM | POA: Insufficient documentation

## 2015-04-02 DIAGNOSIS — I255 Ischemic cardiomyopathy: Secondary | ICD-10-CM | POA: Diagnosis not present

## 2015-04-02 DIAGNOSIS — I10 Essential (primary) hypertension: Secondary | ICD-10-CM | POA: Diagnosis present

## 2015-04-02 DIAGNOSIS — R0789 Other chest pain: Secondary | ICD-10-CM | POA: Diagnosis present

## 2015-04-02 DIAGNOSIS — Z955 Presence of coronary angioplasty implant and graft: Secondary | ICD-10-CM | POA: Diagnosis not present

## 2015-04-02 DIAGNOSIS — I130 Hypertensive heart and chronic kidney disease with heart failure and stage 1 through stage 4 chronic kidney disease, or unspecified chronic kidney disease: Secondary | ICD-10-CM | POA: Insufficient documentation

## 2015-04-02 DIAGNOSIS — I2510 Atherosclerotic heart disease of native coronary artery without angina pectoris: Secondary | ICD-10-CM | POA: Diagnosis present

## 2015-04-02 DIAGNOSIS — R0902 Hypoxemia: Secondary | ICD-10-CM | POA: Diagnosis present

## 2015-04-02 DIAGNOSIS — F419 Anxiety disorder, unspecified: Secondary | ICD-10-CM | POA: Diagnosis not present

## 2015-04-02 DIAGNOSIS — J101 Influenza due to other identified influenza virus with other respiratory manifestations: Secondary | ICD-10-CM | POA: Diagnosis present

## 2015-04-02 HISTORY — DX: Pneumonia, unspecified organism: J18.9

## 2015-04-02 HISTORY — DX: Type 2 diabetes mellitus without complications: E11.9

## 2015-04-02 HISTORY — DX: Chronic kidney disease, stage 3 (moderate): N18.3

## 2015-04-02 HISTORY — DX: Chronic kidney disease, stage 3 unspecified: N18.30

## 2015-04-02 LAB — COMPREHENSIVE METABOLIC PANEL
ALBUMIN: 3.4 g/dL — AB (ref 3.5–5.0)
ALT: 13 U/L — ABNORMAL LOW (ref 14–54)
ANION GAP: 12 (ref 5–15)
AST: 21 U/L (ref 15–41)
Alkaline Phosphatase: 75 U/L (ref 38–126)
BILIRUBIN TOTAL: 0.7 mg/dL (ref 0.3–1.2)
BUN: 40 mg/dL — ABNORMAL HIGH (ref 6–20)
CHLORIDE: 106 mmol/L (ref 101–111)
CO2: 23 mmol/L (ref 22–32)
Calcium: 8.8 mg/dL — ABNORMAL LOW (ref 8.9–10.3)
Creatinine, Ser: 1.65 mg/dL — ABNORMAL HIGH (ref 0.44–1.00)
GFR calc Af Amer: 32 mL/min — ABNORMAL LOW (ref >60)
GFR, EST NON AFRICAN AMERICAN: 27 mL/min — AB (ref >60)
Glucose, Bld: 173 mg/dL — ABNORMAL HIGH (ref 65–99)
POTASSIUM: 4.6 mmol/L (ref 3.5–5.1)
Sodium: 141 mmol/L (ref 135–145)
TOTAL PROTEIN: 6.8 g/dL (ref 6.5–8.1)

## 2015-04-02 LAB — URINALYSIS, ROUTINE W REFLEX MICROSCOPIC
BILIRUBIN URINE: NEGATIVE
Glucose, UA: NEGATIVE mg/dL
HGB URINE DIPSTICK: NEGATIVE
KETONES UR: NEGATIVE mg/dL
Leukocytes, UA: NEGATIVE
NITRITE: NEGATIVE
PH: 5.5 (ref 5.0–8.0)
Protein, ur: 100 mg/dL — AB
Specific Gravity, Urine: 1.016 (ref 1.005–1.030)

## 2015-04-02 LAB — CBC WITH DIFFERENTIAL/PLATELET
BASOS ABS: 0 10*3/uL (ref 0.0–0.1)
BASOS PCT: 0 %
EOS PCT: 2 %
Eosinophils Absolute: 0.1 10*3/uL (ref 0.0–0.7)
HEMATOCRIT: 38.1 % (ref 36.0–46.0)
Hemoglobin: 12.2 g/dL (ref 12.0–15.0)
Lymphocytes Relative: 25 %
Lymphs Abs: 1.1 10*3/uL (ref 0.7–4.0)
MCH: 30.4 pg (ref 26.0–34.0)
MCHC: 32 g/dL (ref 30.0–36.0)
MCV: 95 fL (ref 78.0–100.0)
MONO ABS: 0.4 10*3/uL (ref 0.1–1.0)
MONOS PCT: 10 %
Neutro Abs: 2.7 10*3/uL (ref 1.7–7.7)
Neutrophils Relative %: 63 %
PLATELETS: 152 10*3/uL (ref 150–400)
RBC: 4.01 MIL/uL (ref 3.87–5.11)
RDW: 13.6 % (ref 11.5–15.5)
WBC: 4.3 10*3/uL (ref 4.0–10.5)

## 2015-04-02 LAB — GLUCOSE, CAPILLARY
GLUCOSE-CAPILLARY: 98 mg/dL (ref 65–99)
Glucose-Capillary: 56 mg/dL — ABNORMAL LOW (ref 65–99)
Glucose-Capillary: 71 mg/dL (ref 65–99)

## 2015-04-02 LAB — I-STAT TROPONIN, ED: TROPONIN I, POC: 0.01 ng/mL (ref 0.00–0.08)

## 2015-04-02 LAB — CBG MONITORING, ED
Glucose-Capillary: 128 mg/dL — ABNORMAL HIGH (ref 65–99)
Glucose-Capillary: 180 mg/dL — ABNORMAL HIGH (ref 65–99)

## 2015-04-02 LAB — URINE MICROSCOPIC-ADD ON

## 2015-04-02 LAB — INFLUENZA PANEL BY PCR (TYPE A & B)
H1N1 flu by pcr: NOT DETECTED
INFLAPCR: POSITIVE — AB
INFLBPCR: NEGATIVE

## 2015-04-02 LAB — BRAIN NATRIURETIC PEPTIDE: B Natriuretic Peptide: 125.4 pg/mL — ABNORMAL HIGH (ref 0.0–100.0)

## 2015-04-02 LAB — STREP PNEUMONIAE URINARY ANTIGEN: STREP PNEUMO URINARY ANTIGEN: NEGATIVE

## 2015-04-02 LAB — TROPONIN I: Troponin I: 0.03 ng/mL (ref <0.031)

## 2015-04-02 MED ORDER — CETYLPYRIDINIUM CHLORIDE 0.05 % MT LIQD
7.0000 mL | Freq: Two times a day (BID) | OROMUCOSAL | Status: DC
  Administered 2015-04-02 – 2015-04-06 (×8): 7 mL via OROMUCOSAL

## 2015-04-02 MED ORDER — SODIUM CHLORIDE 0.9 % IV SOLN
INTRAVENOUS | Status: AC
  Administered 2015-04-02: 18:00:00 via INTRAVENOUS

## 2015-04-02 MED ORDER — DEXTROSE 5 % IV SOLN
500.0000 mg | INTRAVENOUS | Status: DC
  Administered 2015-04-03: 500 mg via INTRAVENOUS
  Filled 2015-04-02 (×3): qty 500

## 2015-04-02 MED ORDER — DEXTROSE 5 % IV SOLN
1.0000 g | Freq: Once | INTRAVENOUS | Status: AC
Start: 2015-04-02 — End: 2015-04-02
  Administered 2015-04-02: 1 g via INTRAVENOUS
  Filled 2015-04-02: qty 10

## 2015-04-02 MED ORDER — CLONIDINE HCL 0.1 MG PO TABS
0.1000 mg | ORAL_TABLET | Freq: Every day | ORAL | Status: DC
  Administered 2015-04-03 – 2015-04-06 (×4): 0.1 mg via ORAL
  Filled 2015-04-02 (×4): qty 1

## 2015-04-02 MED ORDER — DEXTROSE 5 % IV SOLN
1.0000 g | INTRAVENOUS | Status: DC
  Filled 2015-04-02: qty 10

## 2015-04-02 MED ORDER — INSULIN ASPART 100 UNIT/ML ~~LOC~~ SOLN
0.0000 [IU] | Freq: Three times a day (TID) | SUBCUTANEOUS | Status: DC
  Administered 2015-04-02 – 2015-04-04 (×4): 3 [IU] via SUBCUTANEOUS
  Administered 2015-04-04: 5 [IU] via SUBCUTANEOUS
  Administered 2015-04-05 – 2015-04-06 (×2): 2 [IU] via SUBCUTANEOUS
  Filled 2015-04-02: qty 1

## 2015-04-02 MED ORDER — DEXTROSE 5 % IV SOLN
500.0000 mg | INTRAVENOUS | Status: DC
  Filled 2015-04-02: qty 500

## 2015-04-02 MED ORDER — GUAIFENESIN ER 600 MG PO TB12
600.0000 mg | ORAL_TABLET | Freq: Two times a day (BID) | ORAL | Status: DC
  Administered 2015-04-02 – 2015-04-06 (×9): 600 mg via ORAL
  Filled 2015-04-02 (×10): qty 1

## 2015-04-02 MED ORDER — KETOROLAC TROMETHAMINE 0.5 % OP SOLN
1.0000 [drp] | Freq: Four times a day (QID) | OPHTHALMIC | Status: DC
  Administered 2015-04-02 – 2015-04-06 (×16): 1 [drp] via OPHTHALMIC
  Filled 2015-04-02: qty 3

## 2015-04-02 MED ORDER — ASPIRIN EC 81 MG PO TBEC
81.0000 mg | DELAYED_RELEASE_TABLET | Freq: Every day | ORAL | Status: DC
  Administered 2015-04-02 – 2015-04-06 (×5): 81 mg via ORAL
  Filled 2015-04-02 (×5): qty 1

## 2015-04-02 MED ORDER — TIMOLOL MALEATE 0.5 % OP SOLN
1.0000 [drp] | Freq: Every day | OPHTHALMIC | Status: DC
  Administered 2015-04-02 – 2015-04-06 (×5): 1 [drp] via OPHTHALMIC
  Filled 2015-04-02: qty 5

## 2015-04-02 MED ORDER — OMEGA-3-ACID ETHYL ESTERS 1 G PO CAPS
1.0000 g | ORAL_CAPSULE | Freq: Two times a day (BID) | ORAL | Status: DC
  Administered 2015-04-02 – 2015-04-06 (×8): 1 g via ORAL
  Filled 2015-04-02 (×8): qty 1

## 2015-04-02 MED ORDER — SODIUM CHLORIDE 0.9 % IV SOLN
INTRAVENOUS | Status: DC
Start: 2015-04-02 — End: 2015-04-02
  Administered 2015-04-02: 12:00:00 via INTRAVENOUS

## 2015-04-02 MED ORDER — OSELTAMIVIR PHOSPHATE 75 MG PO CAPS: 75.0000 mg | ORAL_CAPSULE | Freq: Two times a day (BID) | ORAL | Status: DC

## 2015-04-02 MED ORDER — ENOXAPARIN SODIUM 30 MG/0.3ML ~~LOC~~ SOLN
30.0000 mg | SUBCUTANEOUS | Status: DC
  Administered 2015-04-02 – 2015-04-05 (×4): 30 mg via SUBCUTANEOUS
  Filled 2015-04-02 (×4): qty 0.3

## 2015-04-02 MED ORDER — ACETAMINOPHEN 325 MG PO TABS
650.0000 mg | ORAL_TABLET | Freq: Once | ORAL | Status: AC
  Administered 2015-04-02: 650 mg via ORAL
  Filled 2015-04-02: qty 2

## 2015-04-02 MED ORDER — LOSARTAN POTASSIUM 50 MG PO TABS
50.0000 mg | ORAL_TABLET | Freq: Every day | ORAL | Status: DC
  Administered 2015-04-03 – 2015-04-06 (×4): 50 mg via ORAL
  Filled 2015-04-02 (×4): qty 1

## 2015-04-02 MED ORDER — DEXTROSE 5 % IV SOLN
1.0000 g | INTRAVENOUS | Status: DC
  Administered 2015-04-03: 1 g via INTRAVENOUS
  Filled 2015-04-02: qty 10

## 2015-04-02 MED ORDER — INSULIN ASPART 100 UNIT/ML ~~LOC~~ SOLN: 0.0000 [IU] | Freq: Every day | SUBCUTANEOUS | Status: DC

## 2015-04-02 MED ORDER — DEXTROSE 5 % IV SOLN
500.0000 mg | Freq: Once | INTRAVENOUS | Status: AC
  Administered 2015-04-02: 500 mg via INTRAVENOUS
  Filled 2015-04-02: qty 500

## 2015-04-02 MED ORDER — OSELTAMIVIR PHOSPHATE 6 MG/ML PO SUSR
30.0000 mg | Freq: Every day | ORAL | Status: AC
  Administered 2015-04-02 – 2015-04-06 (×5): 30 mg via ORAL
  Filled 2015-04-02 (×5): qty 5

## 2015-04-02 MED ORDER — PHENOL 1.4 % MT LIQD
1.0000 | OROMUCOSAL | Status: DC | PRN
  Administered 2015-04-02 – 2015-04-04 (×2): 1 via OROMUCOSAL
  Filled 2015-04-02 (×2): qty 177

## 2015-04-02 NOTE — Progress Notes (Signed)
Influenza PCR positive for Flu A- cont TAMIFLU and other supportive care  Kendrick RanchAllison Ellsi, ANP

## 2015-04-02 NOTE — ED Provider Notes (Signed)
CSN: [id number]     Arrival date & time 04/02/15  0845 History   First MD Initiated Contact with Patient 04/02/15 0900     Chief Complaint  Patient presents with  . Chest Pain     (Consider location/radiation/quality/duration/timing/severity/associated sxs/prior Treatment) HPI Comments: Patient is an 82 year old female with history of coronary artery disease status post PCI, diabetes, chronic renal insufficiency, diastolic heart failure presents today with 3 days of worsening productive cough, generalized weakness, decreased appetite, sore throat, nasal congestion and mild shortness of breath. Patient states approximately 3 weeks ago her doctor discontinued half of her medication and she is now only on 5 pills which initially she felt was making her feel better but then she states approximately one week ago she started to feel generally crummy. She lives independently in her own home and is still been able to take care of her ADLs but has had multiple times with walking with her walker where she has felt like she would fall. She has not noted the characteristics of her sputum but has become more copious in the last 24 hours. She is not able to ambulate far before she become short of breath and is also been complaining of bilateral leg pain. She states her legs always swell but she does not think they are any worse. The pain is diffusely from the shin down. His abdominal pain, nausea, vomiting, urinary or bowel problems.  Patient is a 82 y.o. female presenting with chest pain. The history is provided by the patient.  Chest Pain   Past Medical History  Diagnosis Date  . Coronary artery disease     Remote PCI. Had BMS to RCA and DES stent to Diagonal in Jan. 2012   . Diabetes mellitus     long standing  . Diastolic heart failure     EF 55 to 60% per cath in Jan 2012  . Hyperlipidemia   . Hypertension   . Carotid bruit   . Gout   . Obesity   . TIA (transient ischemic attack)   . Chronic  kidney disease     Stage 3  . Ischemic cardiomyopathy     with prior EF of 35%  . Anemia   . Proteinuria   . NSTEMI (non-ST elevated myocardial infarction) [facility]) Jan 2012    with PCI to RCA and DX per Dr. Burt Knack  . Carotid artery occlusion   . Stroke [facility]) 2013    ?  mini    . DVT (deep venous thrombosis) [facility])    Past Surgical History  Procedure Laterality Date  . Appendectomy    . Tonsillectomy    . Left ankle fusion    . Distal rca  04/05/2010    stent  . Fracture surgery Right 2005    bilateral ankles   . Abdominal aortagram N/A 01/17/2014    Procedure: ABDOMINAL AORTAGRAM;  Surgeon: Elam Dutch, MD;  Location: [facility] CATH LAB;  Service: Cardiovascular;  Laterality: N/A;   Family History  Problem Relation Age of Onset  . Heart disease Neg Hx     both parents were murdered   Social History  Substance Use Topics  . Smoking status: Former Smoker    Quit date: 03/21/1958  . Smokeless tobacco: Never Used  . Alcohol Use: No   OB History    No data available     Review of Systems  Constitutional: Positive for appetite change.  Cardiovascular: Positive for chest pain.  All other systems  reviewed and are negative.     Allergies  Percocet; Sulfa antibiotics; Tradjenta; and Penicillins  Home Medications   Prior to Admission medications   Medication Sig Start Date End Date Taking? Authorizing Provider  aspirin 81 MG tablet Take 81 mg by mouth daily. For heart health    Historical Provider, MD  Blood Glucose Monitoring Suppl (ONE TOUCH ULTRA SYSTEM KIT) W/DEVICE KIT E11.59 Check blood sugar daily as directed 08/07/14   Lauree Chandler, NP  Bromfenac Sodium (PROLENSA) 0.07 % SOLN Apply 1 drop to eye daily. Left eye    Historical Provider, MD  Capsaicin-Menthol-Methyl Sal (CAPSAICIN-METHYL SAL-MENTHOL) 0.025-1-12 % CREA Apply 1 application topically 2 (two) times daily. 03/13/15   Konrad Felix, PA  carvedilol (COREG) 3.125 MG tablet TAKE 1 TABLET BY MOUTH TWICE  DAILY FOR HIGH BLOOD PRESSURE 09/04/14   Lauree Chandler, NP  carvedilol (COREG) 3.125 MG tablet TAKE 1 TABLET BY MOUTH TWICE DAILY FOR BLOOD PRESSURE 03/25/15   Lauree Chandler, NP  cloNIDine (CATAPRES) 0.1 MG tablet Take 1 tablet (0.1 mg total) by mouth daily. For high blood pressure 09/04/14   Lauree Chandler, NP  ezetimibe-simvastatin (VYTORIN) 10-20 MG per tablet TAKE 1/2 TABLET BY MOUTH EVERY NIGHT AT BEDTIME for high cholesterol 09/04/14   Lauree Chandler, NP  furosemide (LASIX) 20 MG tablet Take 1 tablet (20 mg total) by mouth daily. For swelling 09/04/14   Lauree Chandler, NP  glucose blood test strip E11.59 Check blood sugar daily as directed 08/07/14   Lauree Chandler, NP  losartan (COZAAR) 50 MG tablet Take 1 tablet (50 mg total) by mouth daily. For high blood pressure 09/04/14   Lauree Chandler, NP  LOTEMAX 0.5 % GEL Place 1 drop into the left eye 2 (two) times daily. 08/21/14   Historical Provider, MD  metFORMIN (GLUCOPHAGE-XR) 500 MG 24 hr tablet Take 1 tablet (500 mg total) by mouth 2 (two) times daily with a meal. 12/15/14   Tivis Ringer, RPH-CPP  nitroGLYCERIN (NITROSTAT) 0.4 MG SL tablet Dissolve one tablet under tongue as needed for chest pain. May repeat as needed every 5 minutes up to 3 doses. Seek medical attention if pain is not relieved after 3 doses 10/27/14   Lauree Chandler, NP  omega-3 acid ethyl esters (LOVAZA) 1 G capsule TAKE 1 CAPSULE BY MOUTH EVERY MORNING AND 1 CAPSULE IN  EVENING 09/04/14   Lauree Chandler, NP  [facility] LANCETS FINE MISC E11.59 Check blood sugar daily as directed 08/07/14   Lauree Chandler, NP  timolol (BETIMOL) 0.5 % ophthalmic solution Place 1 drop into the left eye daily.    Historical Provider, MD   BP 179/61 mmHg  Pulse 74  Temp(Src) 100.4 F (38 C) (Oral)  Resp 16  SpO2 90% Physical Exam  Constitutional: She is oriented to person, place, and time. She appears well-developed and well-nourished. No distress.  HENT:  Head:  Normocephalic and atraumatic.  Nose: Mucosal edema present.  Mouth/Throat: Posterior oropharyngeal erythema present. No oropharyngeal exudate or posterior oropharyngeal edema.  Bilateral cerumen impaction  Eyes: Conjunctivae and EOM are normal. Pupils are equal, round, and reactive to light.  Neck: Normal range of motion. Neck supple.  Cardiovascular: Normal rate, regular rhythm and intact distal pulses.   Murmur heard.  Systolic murmur is present with a grade of 2/6  Pulmonary/Chest: Effort normal. No respiratory distress. She has no wheezes. She has rales in the right lower field and the  left lower field. She exhibits tenderness.    Abdominal: Soft. She exhibits no distension. There is no tenderness. There is no rebound and no guarding.  Musculoskeletal: Normal range of motion. She exhibits edema. She exhibits no tenderness.  1+ edema bilaterally to the lower shin. Tenderness to palpation in bilateral lower extremities. No proximal calf tenderness bilaterally  Neurological: She is alert and oriented to person, place, and time.  Skin: Skin is warm and dry. No rash noted. No erythema.  Psychiatric: She has a normal mood and affect. Her behavior is normal.  Nursing note and vitals reviewed.   ED Course  Procedures (including critical care time) Labs Review Labs Reviewed  COMPREHENSIVE METABOLIC PANEL - Abnormal; Notable for the following:    Glucose, Bld 173 (*)    BUN 40 (*)    Creatinine, Ser 1.65 (*)    Calcium 8.8 (*)    Albumin 3.4 (*)    ALT 13 (*)    GFR calc non Af Amer 27 (*)    GFR calc Af Amer 32 (*)    All other components within normal limits  BRAIN NATRIURETIC PEPTIDE - Abnormal; Notable for the following:    B Natriuretic Peptide 125.4 (*)    All other components within normal limits  CBC WITH DIFFERENTIAL/PLATELET  URINALYSIS, ROUTINE W REFLEX MICROSCOPIC (NOT AT [facility])  INFLUENZA PANEL BY PCR (TYPE A & B, H1N1)  I-STAT TROPOININ, ED    Imaging Review Dg  Chest 2 View  04/02/2015  CLINICAL DATA:  Cough and weakness for the past several days, shortness of breath today. EXAM: CHEST  2 VIEW COMPARISON:  PA and lateral chest x-ray of March 13, 2015 FINDINGS: The lungs are adequately inflated. There are stable mildly increased lung markings in the right perihilar region in lateral to the left heart border. The heart and pulmonary vascularity are normal. The mediastinum is normal in width. There is no pleural effusion. There is dense calcification in the wall of the thoracic aorta. There is prominent thoracic kyphosis without compression fracture. IMPRESSION: Stable scarring in both lungs. There is no alveolar pneumonia nor evidence of CHF. Mild chronic bronchitic changes are present and stable. Electronically Signed   By: David  Martinique M.D.   On: 04/02/2015 09:41   I have personally reviewed and evaluated these images and lab results as part of my medical decision-making.   EKG Interpretation   Date/Time:  Thursday April 02 2015 08:47:25 EST Ventricular Rate:  75 PR Interval:  151 QRS Duration: 121 QT Interval:  390 QTC Calculation: 436 R Axis:   -62 Text Interpretation:  Sinus rhythm Left bundle branch block No significant  change since last tracing Confirmed by Maryan Rued  MD, Loree Fee ([id number]) on  04/02/2015 9:01:55 AM      MDM   Final diagnoses:  CAP (community acquired pneumonia)  Hypoxia    Patient with a significant history of coronary artery disease, diabetes, CHF and chronic kidney disease who presents today with URI symptoms worsening over the last 3 days. Patient did receive a flu shot this year and has had a pneumonia shot in the past. She does have a history of pneumonia proximally 2-3 months ago. She currently is not taking any antibiotics and has not had any in the last month. She complains of generalized weakness, sore throat, nasal congestion and productive cough.  On exam patient has bilateral basilar rales without wheezing.  She has mild edema in bilateral lower extremities but no erythema or calf  tenderness. Patient currently states that her legs look about the same as they always do. She denies any urinary symptoms or GI symptoms.  Concern for pneumonia versus flu versus CHF. Lower suspicion for PE at this time.  EKG with a left bundle branch block which is unchanged. CBC, CMP, troponin, BNP and chest x-ray pending. Patient given Tylenol for fever of 100.4.  11:18 AM Patient's labs are noncontributory with a stable creatinine of 1.65 and normal CBC and troponin. BNP within normal limits. Chest x-ray shows bronchitic changes without other acute findings.  However patient is hypoxic here to 88% at rest when 2 L is placed her O2 sat improves to 97%. Also family states she has been extremely lethargic at home. Concerned about patient going home with hypoxia and concern for developing pneumonia or flu. Flu swab ordered will admit patient for further care.  Pt covered with rocephin and azithromycin  Blanchie Dessert, MD 04/02/15 1148

## 2015-04-02 NOTE — ED Notes (Signed)
Admitting MD at bedside.

## 2015-04-02 NOTE — ED Notes (Signed)
Pt cbg 128

## 2015-04-02 NOTE — H&P (Signed)
 [facility] History and Physical                                                                                    [name], is a 82 y.o. female  MRN: [id number]   DOB - 10/31/1927  Admit Date - 04/02/2015  Outpatient Primary MD for the patient is EUBANKS, JESSICA K, NP  Referring MD: [place] / ER  PMH: Past Medical History  Diagnosis Date  . Coronary artery disease     Remote PCI. Had BMS to RCA and DES stent to Diagonal in Jan. 2012   . Diabetes mellitus     long standing  . Diastolic heart failure     EF 55 to 60% per cath in Jan 2012  . Hyperlipidemia   . Hypertension   . Carotid bruit   . Gout   . Obesity   . TIA (transient ischemic attack)   . Chronic kidney disease     Stage 3  . Ischemic cardiomyopathy     with prior EF of 35%  . Anemia   . Proteinuria   . NSTEMI (non-ST elevated myocardial infarction) (HCC) Jan 2012    with PCI to RCA and DX per Dr. Cooper  . Carotid artery occlusion   . Stroke (HCC) 2013    ?  mini    . DVT (deep venous thrombosis) (HCC)       PSH: Past Surgical History  Procedure Laterality Date  . Appendectomy    . Tonsillectomy    . Left ankle fusion    . Distal rca  04/05/2010    stent  . Fracture surgery Right 2005    bilateral ankles   . Abdominal aortagram N/A 01/17/2014    Procedure: ABDOMINAL AORTAGRAM;  Surgeon: [name] Fields, MD;  Location: [facility] CATH [facility];  Service: Cardiovascular;  Laterality: N/A;     CC:  Chief Complaint  Patient presents with  . Chest Pain     HPI: This is an 82-year-old female patient history diabetes diet-controlled, chronic kidney disease in setting of diabetic nephropathy, peripheral vascular disease, hypertension, anxiety disorder, remote CAD with history of distal RCA stent in 2005, chronic anemia, hyperlipidemia and chronic lower extremity pain likely related to diabetes and neuropathy. Patient presents to the ER with 2-3 days of upper respiratory infection symptoms consisting  of cough that has become more copious and thick in the past 24 hours. Patient did not look at her expectorant and just threw it in the trash or toilet. She's been having chills without apparent fever. Her eyes are red and watery. She's not had any nausea, vomiting or diarrhea. Had low-grade fevers. She is taken over-the-counter remedies without improvement in her symptoms. Her family felt she was more lethargic today so brought her to the ER for evaluation. Patient has also been having pleuritic type chest pain with coughing and increased with deep breathing.   ER Evaluation and treatment: Temp 100.4, BP 167/45, pulse 72 and regular, respirations 17 room air saturations 93% and decreased to 88% with ambulation therefore oxygen applied Two-view chest x-ray with mild chronic bronchitic changes but no evidence of focal infiltrate consistent   with pneumonia or CHF. EKG: Sinus rhythm underlying left bundle-branch block ventricular rate 75 bpm, QTC 436 ms; new T-wave inversion in lead V6 BUN 40 and creatinine 1.65 with a GFR of 27-baseline renal function December 2016 with BUN 37 and creatinine 1.6 BNP 125 Troponin 0.01 WBC unremarkable Urinalysis and culture pending collection Influenza PCR pending collection Tylenol 650 mg by mouth 1 dose Rocephin 1 g IV 1 dose Zithromax 500 mg IV 1 dose  Review of Systems   In addition to the HPI above,  No Fever at home No Headache, changes with Vision or hearing, new weakness, tingling, numbness in any extremity, No problems swallowing food or Liquids, indigestion/reflux No palpitations, orthopnea or DOE No Abdominal pain, N/V; no melena or hematochezia, no dark tarry stools No dysuria, hematuria or flank pain No new skin rashes, lesions, masses or bruises, No new joints pains-aches No recent weight gain or loss No polyuria, polydypsia or polyphagia,  *A full 10 point Review of Systems was done, except as stated above, all other Review of Systems were  negative.  Social History Social History  Substance Use Topics  . Smoking status: Former Smoker    Quit date: 03/21/1958  . Smokeless tobacco: Never Used  . Alcohol Use: No    Resides at: Private residence  Lives with: Alone  Ambulatory status: Without assistive devices; no longer drives  Employment history: Retired OR nurse   Family History Family History  Problem Relation Age of Onset  . Heart disease Neg Hx     both parents were murdered     Prior to Admission medications   Medication Sig Start Date End Date Taking? Authorizing Provider  aspirin 81 MG tablet Take 81 mg by mouth daily. For heart health   Yes Historical Provider, MD  Bromfenac Sodium (PROLENSA) 0.07 % SOLN Apply 1 drop to eye daily. Left eye   Yes Historical Provider, MD  cloNIDine (CATAPRES) 0.1 MG tablet Take 1 tablet (0.1 mg total) by mouth daily. For high blood pressure 09/04/14  Yes Jessica K Eubanks, NP  furosemide (LASIX) 20 MG tablet Take 1 tablet (20 mg total) by mouth daily. For swelling 09/04/14  Yes Jessica K Eubanks, NP  losartan (COZAAR) 50 MG tablet Take 1 tablet (50 mg total) by mouth daily. For high blood pressure 09/04/14  Yes Jessica K Eubanks, NP  LOTEMAX 0.5 % GEL Place 1 drop into the left eye 2 (two) times daily. 08/21/14  Yes Historical Provider, MD  timolol (BETIMOL) 0.5 % ophthalmic solution Place 1 drop into the left eye daily.   Yes Historical Provider, MD  Blood Glucose Monitoring Suppl (ONE TOUCH ULTRA SYSTEM KIT) W/DEVICE KIT E11.59 Check blood sugar daily as directed 08/07/14   Jessica K Eubanks, NP  Capsaicin-Menthol-Methyl Sal (CAPSAICIN-METHYL SAL-MENTHOL) 0.025-1-12 % CREA Apply 1 application topically 2 (two) times daily. 03/13/15   Frank C Patrick, PA  carvedilol (COREG) 3.125 MG tablet TAKE 1 TABLET BY MOUTH TWICE DAILY FOR HIGH BLOOD PRESSURE Patient not taking: Reported on 04/02/2015 09/04/14   Jessica K Eubanks, NP  carvedilol (COREG) 3.125 MG tablet TAKE 1 TABLET BY MOUTH  TWICE DAILY FOR BLOOD PRESSURE Patient not taking: Reported on 04/02/2015 03/25/15   Jessica K Eubanks, NP  ezetimibe-simvastatin (VYTORIN) 10-20 MG per tablet TAKE 1/2 TABLET BY MOUTH EVERY NIGHT AT BEDTIME for high cholesterol Patient not taking: Reported on 04/02/2015 09/04/14   Jessica K Eubanks, NP  glucose blood test strip E11.59 Check blood sugar daily as directed   08/07/14   Jessica K Eubanks, NP  metFORMIN (GLUCOPHAGE-XR) 500 MG 24 hr tablet Take 1 tablet (500 mg total) by mouth 2 (two) times daily with a meal. Patient not taking: Reported on 04/02/2015 12/15/14   Cathey Miller, RPH-CPP  nitroGLYCERIN (NITROSTAT) 0.4 MG SL tablet Dissolve one tablet under tongue as needed for chest pain. May repeat as needed every 5 minutes up to 3 doses. Seek medical attention if pain is not relieved after 3 doses 10/27/14   Jessica K Eubanks, NP  omega-3 acid ethyl esters (LOVAZA) 1 G capsule TAKE 1 CAPSULE BY MOUTH EVERY MORNING AND 1 CAPSULE IN  EVENING Patient not taking: Reported on 04/02/2015 09/04/14   Jessica K Eubanks, NP  ONETOUCH DELICA LANCETS FINE MISC E11.59 Check blood sugar daily as directed 08/07/14   Jessica K Eubanks, NP    Allergies  Allergen Reactions  . Percocet [Oxycodone-Acetaminophen] Other (See Comments)  . Sulfa Antibiotics Other (See Comments)    Tongue swells  . Tradjenta [Linagliptin]     Low back ache, resolved once medication stopped   . Penicillins Hives    Tolerates Ceftriaxone    Physical Exam  Vitals  Blood pressure 138/45, pulse 63, temperature 100.4 F (38 C), temperature source Oral, resp. rate 20, SpO2 97 %.   General:  In no acute distress, appears stated age and mildly toxic  Psych:  Normal affect, Denies Suicidal or Homicidal ideations, Awake Alert, Oriented X 3. Speech and thought patterns are clear and appropriate, no apparent short term memory deficits  Neuro:   No focal neurological deficits, CN II through XII intact, Strength 5/5 all 4 extremities,  Sensation intact all 4 extremities.  ENT:  Ears and Eyes appear Normal, Conjunctivae clear, PER. Dry oral mucosa without erythema or exudates.  Neck:  Supple, No lymphadenopathy appreciated  Respiratory:  Symmetrical chest wall movement, Good air movement bilaterally, bilateral crackles more prominent in the left base otherwise clear to auscultation but coarse. 2 L  Cardiac:  RRR, No Murmurs, no LE edema noted, no JVD, No carotid bruits, peripheral pulses palpable at 2+  Abdomen:  Positive bowel sounds, Soft, Non tender, Non distended,  No masses appreciated, no obvious hepatosplenomegaly  Skin:  No Cyanosis, Normal Skin Turgor, No Skin Rash or Bruise.  Extremities: Symmetrical without obvious trauma or injury,  no effusions.  Data Review  CBC  Recent Labs Lab 04/02/15 0913  WBC 4.3  HGB 12.2  HCT 38.1  PLT 152  MCV 95.0  MCH 30.4  MCHC 32.0  RDW 13.6  LYMPHSABS 1.1  MONOABS 0.4  EOSABS 0.1  BASOSABS 0.0    Chemistries   Recent Labs Lab 04/02/15 0913  NA 141  K 4.6  CL 106  CO2 23  GLUCOSE 173*  BUN 40*  CREATININE 1.65*  CALCIUM 8.8*  AST 21  ALT 13*  ALKPHOS 75  BILITOT 0.7    CrCl cannot be calculated (Unknown ideal weight.).  No results for input(s): TSH, T4TOTAL, T3FREE, THYROIDAB in the last 72 hours.  Invalid input(s): FREET3  Coagulation profile No results for input(s): INR, PROTIME in the last 168 hours.  No results for input(s): DDIMER in the last 72 hours.  Cardiac Enzymes No results for input(s): CKMB, TROPONINI, MYOGLOBIN in the last 168 hours.  Invalid input(s): CK  Invalid input(s): POCBNP  Urinalysis    Component Value Date/Time   COLORURINE YELLOW 06/03/2014 1945   APPEARANCEUR CLEAR 06/03/2014 1945   LABSPEC 1.010 12/23/2014 1513     PHURINE 5.5 12/23/2014 1513   GLUCOSEU NEGATIVE 12/23/2014 1513   HGBUR SMALL* 12/23/2014 1513   BILIRUBINUR NEGATIVE 12/23/2014 1513   BILIRUBINUR Negative 02/21/2013 1622   KETONESUR  NEGATIVE 12/23/2014 1513   PROTEINUR 100* 12/23/2014 1513   UROBILINOGEN 0.2 12/23/2014 1513   NITRITE NEGATIVE 12/23/2014 1513   NITRITE Negative 02/21/2013 1622   LEUKOCYTESUR TRACE* 12/23/2014 1513   LEUKOCYTESUR Negative 02/21/2013 1622    Imaging results:   Dg Chest 2 View  04/02/2015  CLINICAL DATA:  Cough and weakness for the past several days, shortness of breath today. EXAM: CHEST  2 VIEW COMPARISON:  PA and lateral chest x-ray of March 13, 2015 FINDINGS: The lungs are adequately inflated. There are stable mildly increased lung markings in the right perihilar region in lateral to the left heart border. The heart and pulmonary vascularity are normal. The mediastinum is normal in width. There is no pleural effusion. There is dense calcification in the wall of the thoracic aorta. There is prominent thoracic kyphosis without compression fracture. IMPRESSION: Stable scarring in both lungs. There is no alveolar pneumonia nor evidence of CHF. Mild chronic bronchitic changes are present and stable. Electronically Signed   By: David  Martinique M.D.   On: 04/02/2015 09:41   Dg Chest 2 View  03/13/2015  CLINICAL DATA:  82 year old with acute onset of bilateral lower extremity pain four days ago described as cramping in the calves and burning in the ankles, no recent injuries. Acute onset of nonproductive cough 2 days ago. EXAM: CHEST  2 VIEW COMPARISON:  06/07/2014 and earlier. FINDINGS: Cardiac silhouette moderately enlarged, unchanged. Thoracic aorta diffusely atherosclerotic, unchanged. Hilar and mediastinal contours otherwise unremarkable. Linear scarring in the superior segment left lower lobe. Lungs otherwise clear. No localized airspace consolidation. No pleural effusions. No pneumothorax. Normal pulmonary vascularity. Osseous demineralization, degenerative changes involving the mid and lower thoracic spine and slight exaggeration of the usual thoracic kyphosis, unchanged. IMPRESSION: Stable  moderate cardiomegaly and linear scarring in the superior segment left lower lobe. No acute cardiopulmonary disease. Electronically Signed   By: Evangeline Dakin M.D.   On: 03/13/2015 15:24     EKG: (Independently reviewed)  Sinus rhythm underlying left bundle-branch block ventricular rate 75 bpm, QTC 436 ms; new T-wave inversion in lead V6   Assessment & Plan  Principal Problem:   Acute respiratory failure with hypoxia/ Acute URI -Admit to floor/observational status -No focal infiltrate on chest x-ray-history and exam consistent with viral etiology -Suspect secondary bacterial infection such as bronchitis -Empiric Rocephin and Zithromax -Blood culture and urine culture -Urinary strep; no risk factors for Legionella -Supportive care with oxygen, mucolytics and mobilization/pulmonary toileting -Gentle IV fluid hydration since documented poor intake prior to arrival -Influenza PCR and respiratory viral panel; patient did receive influenza vaccine  -Tamiflu  Active Problems:   Chronic diastolic heart failure, NYHA class 1  -Last echocardiogram was in 2013 -Currently appears compensated -Continue Cozaar -Lasix on hold    Coronary artery disease -History of stent to RCA in 2005 -Chest pain appears more consistent with pleuritic etiology -Initial troponin negative and patient had new T-wave inversions in solitary lead (V6) -Continue low-dose aspirin -Not on beta blocker prior to admission -Cycle enzymes    Hypertension -Moderate controlled -Continue ARB and alpha blocker    Diabetes mellitus with nephropathy  -Patient reports no longer takes metformin and is primarily diet controlled -Hemoglobin A1c -SSI    Hyperlipidemia -Continue preadmission Lovaza    Anxiety disorder -Not on chronic  medications    Chronic kidney disease, stage III -Renal function stable and at baseline -Gentle IV fluid hydration     DVT Prophylaxis: Lovenox-renally dose adjusted  Family  Communication:  Multiple family members at bedside  Code Status:  Full code  Condition:  Stable  Discharge disposition: Anticipate discharge back to home environment and next 24-48 hours when medically stable  Time spent in minutes : 60      , L. ANP on 04/02/2015 at 12:20 PM  You may contact me by going to [email or web address] - password [email or web address]  I am available from 7a-7p but please confirm I am on the schedule by going to Amion as above.   After 7p please contact night coverage person covering me after hours  [facility]  

## 2015-04-02 NOTE — ED Notes (Signed)
This RN walked to room patient Sats 88% RN placed patient on 2L patient Sats 95%

## 2015-04-02 NOTE — Progress Notes (Signed)
Patient arrived on unit via stretcher from ED.  No family at bedside.  Patient placed on droplet precaution..Marland Kitchen

## 2015-04-02 NOTE — ED Notes (Signed)
While pt sleeping O2 dips into high 80's, pt placed on 2 L nasal cannula.

## 2015-04-02 NOTE — ED Notes (Signed)
Pt cbg 180

## 2015-04-02 NOTE — Progress Notes (Signed)
Called ED for report  

## 2015-04-02 NOTE — ED Notes (Signed)
Pt. Reports having generalized weakness with lt. Lower chest pain, non-radiating. These symptoms began about 2 weeks. Ago.  She also reports she has a intermittent cougn

## 2015-04-03 ENCOUNTER — Observation Stay (HOSPITAL_COMMUNITY): Payer: Medicare Other

## 2015-04-03 DIAGNOSIS — F411 Generalized anxiety disorder: Secondary | ICD-10-CM

## 2015-04-03 DIAGNOSIS — I5032 Chronic diastolic (congestive) heart failure: Secondary | ICD-10-CM | POA: Diagnosis not present

## 2015-04-03 DIAGNOSIS — I10 Essential (primary) hypertension: Secondary | ICD-10-CM

## 2015-04-03 DIAGNOSIS — N183 Chronic kidney disease, stage 3 (moderate): Secondary | ICD-10-CM

## 2015-04-03 DIAGNOSIS — J9601 Acute respiratory failure with hypoxia: Secondary | ICD-10-CM | POA: Diagnosis not present

## 2015-04-03 DIAGNOSIS — J09X2 Influenza due to identified novel influenza A virus with other respiratory manifestations: Secondary | ICD-10-CM | POA: Diagnosis not present

## 2015-04-03 DIAGNOSIS — E785 Hyperlipidemia, unspecified: Secondary | ICD-10-CM | POA: Diagnosis not present

## 2015-04-03 DIAGNOSIS — F419 Anxiety disorder, unspecified: Secondary | ICD-10-CM | POA: Diagnosis not present

## 2015-04-03 DIAGNOSIS — J101 Influenza due to other identified influenza virus with other respiratory manifestations: Secondary | ICD-10-CM

## 2015-04-03 DIAGNOSIS — E1121 Type 2 diabetes mellitus with diabetic nephropathy: Secondary | ICD-10-CM | POA: Diagnosis not present

## 2015-04-03 LAB — CBC WITH DIFFERENTIAL/PLATELET
Basophils Absolute: 0 10*3/uL (ref 0.0–0.1)
Basophils Relative: 0 %
EOS ABS: 0.1 10*3/uL (ref 0.0–0.7)
EOS PCT: 2 %
HCT: 35.1 % — ABNORMAL LOW (ref 36.0–46.0)
Hemoglobin: 11.3 g/dL — ABNORMAL LOW (ref 12.0–15.0)
LYMPHS ABS: 1.2 10*3/uL (ref 0.7–4.0)
LYMPHS PCT: 30 %
MCH: 30.9 pg (ref 26.0–34.0)
MCHC: 32.2 g/dL (ref 30.0–36.0)
MCV: 95.9 fL (ref 78.0–100.0)
Monocytes Absolute: 0.5 10*3/uL (ref 0.1–1.0)
Monocytes Relative: 12 %
Neutro Abs: 2.3 10*3/uL (ref 1.7–7.7)
Neutrophils Relative %: 56 %
PLATELETS: 135 10*3/uL — AB (ref 150–400)
RBC: 3.66 MIL/uL — AB (ref 3.87–5.11)
RDW: 13.7 % (ref 11.5–15.5)
WBC: 4.2 10*3/uL (ref 4.0–10.5)

## 2015-04-03 LAB — TROPONIN I: Troponin I: 0.03 ng/mL (ref <0.031)

## 2015-04-03 LAB — HEMOGLOBIN A1C
Hgb A1c MFr Bld: 7.5 % — ABNORMAL HIGH (ref 4.8–5.6)
MEAN PLASMA GLUCOSE: 169 mg/dL

## 2015-04-03 LAB — URINE CULTURE

## 2015-04-03 LAB — BASIC METABOLIC PANEL
Anion gap: 9 (ref 5–15)
BUN: 31 mg/dL — AB (ref 6–20)
CHLORIDE: 112 mmol/L — AB (ref 101–111)
CO2: 24 mmol/L (ref 22–32)
CREATININE: 1.41 mg/dL — AB (ref 0.44–1.00)
Calcium: 8.2 mg/dL — ABNORMAL LOW (ref 8.9–10.3)
GFR calc Af Amer: 38 mL/min — ABNORMAL LOW (ref >60)
GFR calc non Af Amer: 33 mL/min — ABNORMAL LOW (ref >60)
GLUCOSE: 109 mg/dL — AB (ref 65–99)
POTASSIUM: 4.2 mmol/L (ref 3.5–5.1)
SODIUM: 145 mmol/L (ref 135–145)

## 2015-04-03 LAB — HIV ANTIBODY (ROUTINE TESTING W REFLEX): HIV Screen 4th Generation wRfx: NONREACTIVE

## 2015-04-03 LAB — GLUCOSE, CAPILLARY
GLUCOSE-CAPILLARY: 171 mg/dL — AB (ref 65–99)
GLUCOSE-CAPILLARY: 106 mg/dL — AB (ref 65–99)
Glucose-Capillary: 154 mg/dL — ABNORMAL HIGH (ref 65–99)

## 2015-04-03 MED ORDER — CAPSAICIN 0.075 % EX CREA
TOPICAL_CREAM | Freq: Two times a day (BID) | CUTANEOUS | Status: DC
  Administered 2015-04-03 – 2015-04-06 (×7): via TOPICAL
  Filled 2015-04-03: qty 60

## 2015-04-03 MED ORDER — MENTHOL 3 MG MT LOZG
1.0000 | LOZENGE | OROMUCOSAL | Status: DC | PRN
  Administered 2015-04-04: 3 mg via ORAL
  Filled 2015-04-03: qty 9

## 2015-04-03 MED ORDER — CARVEDILOL 3.125 MG PO TABS
3.1250 mg | ORAL_TABLET | Freq: Two times a day (BID) | ORAL | Status: DC
  Administered 2015-04-03 – 2015-04-06 (×6): 3.125 mg via ORAL
  Filled 2015-04-03 (×6): qty 1

## 2015-04-03 MED ORDER — LORATADINE 10 MG PO TABS
10.0000 mg | ORAL_TABLET | Freq: Every day | ORAL | Status: DC
  Administered 2015-04-03 – 2015-04-06 (×4): 10 mg via ORAL
  Filled 2015-04-03 (×4): qty 1

## 2015-04-03 MED ORDER — IPRATROPIUM-ALBUTEROL 0.5-2.5 (3) MG/3ML IN SOLN
3.0000 mL | RESPIRATORY_TRACT | Status: DC | PRN
  Administered 2015-04-04: 3 mL via RESPIRATORY_TRACT
  Filled 2015-04-03: qty 3

## 2015-04-03 MED ORDER — IPRATROPIUM-ALBUTEROL 0.5-2.5 (3) MG/3ML IN SOLN
3.0000 mL | Freq: Four times a day (QID) | RESPIRATORY_TRACT | Status: DC
  Administered 2015-04-03 – 2015-04-05 (×4): 3 mL via RESPIRATORY_TRACT
  Filled 2015-04-03 (×6): qty 3

## 2015-04-03 MED ORDER — FLUTICASONE PROPIONATE 50 MCG/ACT NA SUSP
2.0000 | Freq: Every day | NASAL | Status: DC
  Administered 2015-04-03 – 2015-04-05 (×3): 2 via NASAL
  Filled 2015-04-03: qty 16

## 2015-04-03 MED ORDER — SODIUM CHLORIDE 0.9 % IV SOLN
INTRAVENOUS | Status: AC
  Administered 2015-04-03: 1000 mL via INTRAVENOUS

## 2015-04-03 NOTE — Progress Notes (Signed)
[facility] PROGRESS NOTE  [name]:[id number] DOB: [birth year] DOA: 04/02/2015 PCP: Kimber Relic, MD  Assessment/Plan: #1 acute hypoxic respiratory failure secondary to influenza A Patient admitted with acute hypoxic respiratory failure secondary to influenza A. Chest x-ray obtained and repeat chest x-ray was negative for any acute infiltrate. Influenza panel was positive for influenza A. Patient with some clinical improvement. Continue Tamiflu, IV fluids, supportive care. Will discontinue empiric IV Rocephin and azithromycin as patient does not have a pneumonia. Follow.  #2 chronic kidney disease stage III Stable.  #3 atypical chest pain Likely secondary to influenza A as occur is with cough. Cardiac enzymes were negative 3. No further cardiac workup needed at this time. Follow.  #4 chronic diastolic CHF Currently stable. Patient is compensated. Continue gentle hydration. Continue to hold diuretics.  #5 coronary artery disease Status post stent to RCA in 2005. Currently stable. Cardiac enzymes negative 3. Continue low-dose aspirin. Follow.  #6 hypertension Stable. Continue Coreg, clonidine, Cozaar.  #7 diabetes mellitus Hemoglobin A1c is 7.5. CBGs have ranged from 98 -171. Continue sliding scale insulin.  #8 anxiety disorder Stable. Follow.  #9 hyperlipidemia Stable. Continue lovaza  #10 prophylaxis Lovenox for DVT prophylaxis.  Code Status: Full Family Communication: Updated patient. No family at bedside. Disposition Plan: Remain inpatient. Home versus skilled nursing facility when medically stable.   Consultants:  None  Procedures:  Chest x-ray 04/02/2015, 04/03/2015  Antibiotics:  IV azithromycin 04/02/2015>>>> 04/03/2015  IV Rocephin 04/02/2015>>>>> 04/03/2015  HPI/Subjective: Patient complaining of sore throat. Patients with some shortness of breath. Patient denies any chest pain.  Objective: Filed Vitals:   04/03/15 0500 04/03/15  0953  BP: 198/53 178/50  Pulse: 94 72  Temp: 101.3 F (38.5 C) 100.4 F (38 C)  Resp: 18 18    Intake/Output Summary (Last 24 hours) at 04/03/15 1612 Last data filed at 04/03/15 1041  Gross per 24 hour  Intake    360 ml  Output    820 ml  Net   -460 ml   Filed Weights   04/02/15 1723  Weight: 64.6 kg (142 lb 6.7 oz)    Exam:   General:  NAD  Cardiovascular: RRR  Respiratory: CTAB  Abdomen: Soft, nontender, nondistended, positive bowel sounds.  Musculoskeletal: No clubbing cyanosis or edema.  Data Reviewed: Basic Metabolic Panel:  Recent Labs Lab 04/02/15 0913 04/03/15 0310  NA 141 145  K 4.6 4.2  CL 106 112*  CO2 23 24  GLUCOSE 173* 109*  BUN 40* 31*  CREATININE 1.65* 1.41*  CALCIUM 8.8* 8.2*   Liver Function Tests:  Recent Labs Lab 04/02/15 0913  AST 21  ALT 13*  ALKPHOS 75  BILITOT 0.7  PROT 6.8  ALBUMIN 3.4*   No results for input(s): LIPASE, AMYLASE in the last 168 hours. No results for input(s): AMMONIA in the last 168 hours. CBC:  Recent Labs Lab 04/02/15 0913 04/03/15 0310  WBC 4.3 4.2  NEUTROABS 2.7 2.3  HGB 12.2 11.3*  HCT 38.1 35.1*  MCV 95.0 95.9  PLT 152 135*   Cardiac Enzymes:  Recent Labs Lab 04/02/15 1711 04/02/15 2009 04/03/15 0310  TROPONINI <0.03 0.03 0.03   BNP (last 3 results)  Recent Labs  06/03/14 1838 04/02/15 0913  BNP 530.3* 125.4*    ProBNP (last 3 results) No results for input(s): PROBNP in the last 8760 hours.  CBG:  Recent Labs Lab 04/02/15 1335 04/02/15 1709 04/02/15 1726 04/02/15 2041 04/03/15 1215  GLUCAP 180* 56* 71  98 171*    Recent Results (from the past 240 hour(s))  Urine culture     Status: None   Collection Time: 04/02/15 11:35 AM  Result Value Ref Range Status   Specimen Description URINE, RANDOM  Final   Special Requests NONE  Final   Culture MULTIPLE SPECIES PRESENT, SUGGEST RECOLLECTION  Final   Report Status 04/03/2015 FINAL  Final  Culture, blood (Routine  X 2) w Reflex to ID Panel     Status: None (Preliminary result)   Collection Time: 04/02/15  2:00 PM  Result Value Ref Range Status   Specimen Description BLOOD RIGHT FOREARM  Final   Special Requests BOTTLES DRAWN AEROBIC AND ANAEROBIC 5CCS  Final   Culture NO GROWTH < 24 HOURS  Final   Report Status PENDING  Incomplete  Culture, blood (Routine X 2) w Reflex to ID Panel     Status: None (Preliminary result)   Collection Time: 04/02/15  2:05 PM  Result Value Ref Range Status   Specimen Description BLOOD RIGHT HAND  Final   Special Requests BOTTLES DRAWN AEROBIC AND ANAEROBIC 5CCS  Final   Culture NO GROWTH < 24 HOURS  Final   Report Status PENDING  Incomplete     Studies: Dg Chest 2 View  04/03/2015  CLINICAL DATA:  Hypoxia, productive cough, generalized weakness, decreased appetite, sore throat, nasal congestion and mild shortness of breath, hx of cad, htn, dm, hx of coronary angioplasty with stent 04/05/10 EXAM: CHEST  2 VIEW COMPARISON:  04/02/2015 FINDINGS: Heart size is upper limits normal. There are no focal consolidations or pleural effusions. Stable bronchitic changes. Mild subsegmental linear densities identified at the bases consistent with atelectasis or scarring. No pulmonary edema. IMPRESSION: 1. Stable scarring and bronchitic changes. 2.  No focal acute pulmonary abnormality. Electronically Signed   By: Norva Pavlov M.D.   On: 04/03/2015 09:12   Dg Chest 2 View  04/02/2015  CLINICAL DATA:  Cough and weakness for the past several days, shortness of breath today. EXAM: CHEST  2 VIEW COMPARISON:  PA and lateral chest x-ray of March 13, 2015 FINDINGS: The lungs are adequately inflated. There are stable mildly increased lung markings in the right perihilar region in lateral to the left heart border. The heart and pulmonary vascularity are normal. The mediastinum is normal in width. There is no pleural effusion. There is dense calcification in the wall of the thoracic aorta. There  is prominent thoracic kyphosis without compression fracture. IMPRESSION: Stable scarring in both lungs. There is no alveolar pneumonia nor evidence of CHF. Mild chronic bronchitic changes are present and stable. Electronically Signed   By: David  [place] M.D.   On: 04/02/2015 09:41    Scheduled Meds: . antiseptic oral rinse  7 mL Mouth Rinse BID  . aspirin EC  81 mg Oral Daily  . azithromycin  500 mg Intravenous Q24H  . capsicum   Topical BID  . cefTRIAXone (ROCEPHIN)  IV  1 g Intravenous Q24H  . cloNIDine  0.1 mg Oral Daily  . enoxaparin (LOVENOX) injection  30 mg Subcutaneous Q24H  . guaiFENesin  600 mg Oral BID  . insulin aspart  0-15 Units Subcutaneous TID WC  . insulin aspart  0-5 Units Subcutaneous QHS  . ketorolac  1 drop Left Eye QID  . losartan  50 mg Oral Daily  . omega-3 acid ethyl esters  1 g Oral BID  . oseltamivir  30 mg Oral Daily  . timolol  1 drop  Left Eye Daily   Continuous Infusions: . sodium chloride 75 mL/hr at 04/03/15 [id number]    Principal Problem:   Acute respiratory failure with hypoxia (HCC) Active Problems:   Influenza A   Coronary artery disease   Chronic diastolic heart failure, NYHA class 1 (HCC)   Hyperlipidemia   Hypertension   Diabetes mellitus with nephropathy (HCC)   Anxiety disorder   Chronic kidney disease, stage III (moderate)   Acute URI    Time spent: 35 minutes    Onyekachi Gathright M.D. [facility] Pager [phone]. If 7PM-7AM, please contact night-coverage at [email or web address], password [email or web address] 04/03/2015, 4:12 PM  LOS: 1 day

## 2015-04-04 DIAGNOSIS — I5032 Chronic diastolic (congestive) heart failure: Secondary | ICD-10-CM

## 2015-04-04 DIAGNOSIS — E1121 Type 2 diabetes mellitus with diabetic nephropathy: Secondary | ICD-10-CM | POA: Diagnosis not present

## 2015-04-04 DIAGNOSIS — J9601 Acute respiratory failure with hypoxia: Secondary | ICD-10-CM | POA: Diagnosis not present

## 2015-04-04 DIAGNOSIS — I10 Essential (primary) hypertension: Secondary | ICD-10-CM | POA: Diagnosis not present

## 2015-04-04 DIAGNOSIS — J09X2 Influenza due to identified novel influenza A virus with other respiratory manifestations: Secondary | ICD-10-CM | POA: Diagnosis not present

## 2015-04-04 LAB — CBC
HEMATOCRIT: 30.8 % — AB (ref 36.0–46.0)
Hemoglobin: 9.7 g/dL — ABNORMAL LOW (ref 12.0–15.0)
MCH: 30.7 pg (ref 26.0–34.0)
MCHC: 31.5 g/dL (ref 30.0–36.0)
MCV: 97.5 fL (ref 78.0–100.0)
PLATELETS: 118 10*3/uL — AB (ref 150–400)
RBC: 3.16 MIL/uL — ABNORMAL LOW (ref 3.87–5.11)
RDW: 13.7 % (ref 11.5–15.5)
WBC: 3.7 10*3/uL — AB (ref 4.0–10.5)

## 2015-04-04 LAB — BASIC METABOLIC PANEL
Anion gap: 7 (ref 5–15)
BUN: 23 mg/dL — AB (ref 6–20)
CHLORIDE: 114 mmol/L — AB (ref 101–111)
CO2: 23 mmol/L (ref 22–32)
Calcium: 8.1 mg/dL — ABNORMAL LOW (ref 8.9–10.3)
Creatinine, Ser: 1.25 mg/dL — ABNORMAL HIGH (ref 0.44–1.00)
GFR calc Af Amer: 44 mL/min — ABNORMAL LOW (ref >60)
GFR calc non Af Amer: 38 mL/min — ABNORMAL LOW (ref >60)
GLUCOSE: 109 mg/dL — AB (ref 65–99)
POTASSIUM: 4.1 mmol/L (ref 3.5–5.1)
Sodium: 144 mmol/L (ref 135–145)

## 2015-04-04 LAB — GLUCOSE, CAPILLARY
GLUCOSE-CAPILLARY: 167 mg/dL — AB (ref 65–99)
Glucose-Capillary: 97 mg/dL (ref 65–99)
Glucose-Capillary: 199 mg/dL — ABNORMAL HIGH (ref 65–99)
Glucose-Capillary: 215 mg/dL — ABNORMAL HIGH (ref 65–99)

## 2015-04-04 LAB — RESPIRATORY VIRUS PANEL
Adenovirus: NEGATIVE
Influenza A: POSITIVE — AB
Influenza B: NEGATIVE
METAPNEUMOVIRUS: NEGATIVE
PARAINFLUENZA 1 A: NEGATIVE
PARAINFLUENZA 2 A: NEGATIVE
PARAINFLUENZA 3 A: NEGATIVE
RHINOVIRUS: NEGATIVE
Respiratory Syncytial Virus A: NEGATIVE
Respiratory Syncytial Virus B: NEGATIVE

## 2015-04-04 NOTE — Progress Notes (Signed)
[facility] PROGRESS NOTE  [name] RUE:[id number] DOB: 09/24/1927 DOA: 04/02/2015 PCP: Kimber Relic, MD  Assessment/Plan: #1 acute hypoxic respiratory failure secondary to influenza A Patient admitted with acute hypoxic respiratory failure secondary to influenza A. Chest x-ray obtained and repeat chest x-ray was negative for any acute infiltrate. Influenza panel was positive for influenza A. Patient with some clinical improvement. Continue Tamiflu, IV fluids, supportive care. Follow.  #2 chronic kidney disease stage III Stable.  #3 atypical chest pain Likely secondary to influenza A as occur is with cough. Cardiac enzymes were negative 3. No further cardiac workup needed at this time. Follow.  #4 chronic diastolic CHF Currently stable. Patient is compensated. Continue gentle hydration. Continue to hold diuretics.  #5 coronary artery disease Status post stent to RCA in 2005. Currently stable. Cardiac enzymes negative 3. Continue low-dose aspirin. Follow.  #6 hypertension Stable. Continue Coreg, clonidine, Cozaar.  #7 diabetes mellitus Hemoglobin A1c is 7.5. CBGs have ranged from 97 -215. Continue sliding scale insulin.  #8 anxiety disorder Stable. Follow.  #9 hyperlipidemia Stable. Continue lovaza  #10 prophylaxis Lovenox for DVT prophylaxis.  Code Status: Full Family Communication: Updated patient. No family at bedside. Disposition Plan: Remain inpatient. Home versus skilled nursing facility when medically stable.   Consultants:  None  Procedures:  Chest x-ray 04/02/2015, 04/03/2015  Antibiotics:  IV azithromycin 04/02/2015>>>> 04/03/2015  IV Rocephin 04/02/2015>>>>> 04/03/2015  HPI/Subjective: Patient complaining of sore throat. Patient  States she feels lousy. Patient states she's doesn't feel well and feels very weak.  Objective: Filed Vitals:   04/04/15 0817 04/04/15 1630  BP: 167/44 122/29  Pulse: 60 59  Temp: 98.2 F (36.8 C) 97.7  F (36.5 C)  Resp: 18 17    Intake/Output Summary (Last 24 hours) at 04/04/15 1859 Last data filed at 04/04/15 1836  Gross per 24 hour  Intake    720 ml  Output      0 ml  Net    720 ml   Filed Weights   04/02/15 1723 04/03/15 1934  Weight: 64.6 kg (142 lb 6.7 oz) 60 kg (132 lb 4.4 oz)    Exam:   General:  NAD  Cardiovascular: RRR  Respiratory: CTAB  Abdomen: Soft, nontender, nondistended, positive bowel sounds.  Musculoskeletal: No clubbing cyanosis or edema.  Data Reviewed: Basic Metabolic Panel:  Recent Labs Lab 04/02/15 0913 04/03/15 0310 04/04/15 0505  NA 141 145 144  K 4.6 4.2 4.1  CL 106 112* 114*  CO2 23 24 23   GLUCOSE 173* 109* 109*  BUN 40* 31* 23*  CREATININE 1.65* 1.41* 1.25*  CALCIUM 8.8* 8.2* 8.1*   Liver Function Tests:  Recent Labs Lab 04/02/15 0913  AST 21  ALT 13*  ALKPHOS 75  BILITOT 0.7  PROT 6.8  ALBUMIN 3.4*   No results for input(s): LIPASE, AMYLASE in the last 168 hours. No results for input(s): AMMONIA in the last 168 hours. CBC:  Recent Labs Lab 04/02/15 0913 04/03/15 0310 04/04/15 0505  WBC 4.3 4.2 3.7*  NEUTROABS 2.7 2.3  --   HGB 12.2 11.3* 9.7*  HCT 38.1 35.1* 30.8*  MCV 95.0 95.9 97.5  PLT 152 135* 118*   Cardiac Enzymes:  Recent Labs Lab 04/02/15 1711 04/02/15 2009 04/03/15 0310  TROPONINI <0.03 0.03 0.03   BNP (last 3 results)  Recent Labs  06/03/14 1838 04/02/15 0913  BNP 530.3* 125.4*    ProBNP (last 3 results) No results for input(s): PROBNP in the last 8760  hours.  CBG:  Recent Labs Lab 04/03/15 1708 04/03/15 1927 04/04/15 0814 04/04/15 1134 04/04/15 1628  GLUCAP 154* 106* 97 199* 215*    Recent Results (from the past 240 hour(s))  Respiratory virus panel     Status: Abnormal   Collection Time: 04/02/15 11:19 AM  Result Value Ref Range Status   Respiratory Syncytial Virus A Negative Negative Final   Respiratory Syncytial Virus B Negative Negative Final   Influenza A  Positive (A) Negative Final    Comment: Subtype: H3   Influenza B Negative Negative Final   Parainfluenza 1 Negative Negative Final   Parainfluenza 2 Negative Negative Final   Parainfluenza 3 Negative Negative Final   Metapneumovirus Negative Negative Final   Rhinovirus Negative Negative Final   Adenovirus Negative Negative Final    Comment: (NOTE) Performed At: [facility] [address] [place], [place] [id number] Mila HomerHancock William F MD WU:[phone]   Urine culture     Status: None   Collection Time: 04/02/15 11:35 AM  Result Value Ref Range Status   Specimen Description URINE, RANDOM  Final   Special Requests NONE  Final   Culture MULTIPLE SPECIES PRESENT, SUGGEST RECOLLECTION  Final   Report Status 04/03/2015 FINAL  Final  Culture, blood (Routine X 2) w Reflex to ID Panel     Status: None (Preliminary result)   Collection Time: 04/02/15  2:00 PM  Result Value Ref Range Status   Specimen Description BLOOD RIGHT FOREARM  Final   Special Requests BOTTLES DRAWN AEROBIC AND ANAEROBIC 5CCS  Final   Culture NO GROWTH 2 DAYS  Final   Report Status PENDING  Incomplete  Culture, blood (Routine X 2) w Reflex to ID Panel     Status: None (Preliminary result)   Collection Time: 04/02/15  2:05 PM  Result Value Ref Range Status   Specimen Description BLOOD RIGHT HAND  Final   Special Requests BOTTLES DRAWN AEROBIC AND ANAEROBIC 5CCS  Final   Culture NO GROWTH 2 DAYS  Final   Report Status PENDING  Incomplete     Studies: Dg Chest 2 View  04/03/2015  CLINICAL DATA:  Hypoxia, productive cough, generalized weakness, decreased appetite, sore throat, nasal congestion and mild shortness of breath, hx of cad, htn, dm, hx of coronary angioplasty with stent 04/05/10 EXAM: CHEST  2 VIEW COMPARISON:  04/02/2015 FINDINGS: Heart size is upper limits normal. There are no focal consolidations or pleural effusions. Stable bronchitic changes. Mild subsegmental linear densities identified at the  bases consistent with atelectasis or scarring. No pulmonary edema. IMPRESSION: 1. Stable scarring and bronchitic changes. 2.  No focal acute pulmonary abnormality. Electronically Signed   By: Norva PavlovElizabeth  Brown M.D.   On: 04/03/2015 09:12    Scheduled Meds: . antiseptic oral rinse  7 mL Mouth Rinse BID  . aspirin EC  81 mg Oral Daily  . capsicum   Topical BID  . carvedilol  3.125 mg Oral BID WC  . cloNIDine  0.1 mg Oral Daily  . enoxaparin (LOVENOX) injection  30 mg Subcutaneous Q24H  . fluticasone  2 spray Each Nare Daily  . guaiFENesin  600 mg Oral BID  . insulin aspart  0-15 Units Subcutaneous TID WC  . insulin aspart  0-5 Units Subcutaneous QHS  . ipratropium-albuterol  3 mL Nebulization Q6H  . ketorolac  1 drop Left Eye QID  . loratadine  10 mg Oral Daily  . losartan  50 mg Oral Daily  . omega-3 acid ethyl esters  1 g Oral BID  . oseltamivir  30 mg Oral Daily  . timolol  1 drop Left Eye Daily   Continuous Infusions: . sodium chloride 50 mL/hr at 04/04/15 4098    Principal Problem:   Acute respiratory failure with hypoxia (HCC) Active Problems:   Influenza A   Coronary artery disease   Chronic diastolic heart failure, NYHA class 1 (HCC)   Hyperlipidemia   Hypertension   Diabetes mellitus with nephropathy (HCC)   Anxiety disorder   Chronic kidney disease, stage III (moderate)   Acute URI    Time spent: 35 minutes    Charonda Hefter M.D. [facility] Pager [phone]. If 7PM-7AM, please contact night-coverage at [email or web address], password [facility] 04/04/2015, 6:59 PM  LOS: 2 days

## 2015-04-05 DIAGNOSIS — J9601 Acute respiratory failure with hypoxia: Secondary | ICD-10-CM | POA: Diagnosis not present

## 2015-04-05 DIAGNOSIS — J09X2 Influenza due to identified novel influenza A virus with other respiratory manifestations: Secondary | ICD-10-CM | POA: Diagnosis not present

## 2015-04-05 DIAGNOSIS — E1121 Type 2 diabetes mellitus with diabetic nephropathy: Secondary | ICD-10-CM | POA: Diagnosis not present

## 2015-04-05 DIAGNOSIS — N183 Chronic kidney disease, stage 3 (moderate): Secondary | ICD-10-CM | POA: Diagnosis not present

## 2015-04-05 DIAGNOSIS — F419 Anxiety disorder, unspecified: Secondary | ICD-10-CM

## 2015-04-05 DIAGNOSIS — R0902 Hypoxemia: Secondary | ICD-10-CM

## 2015-04-05 LAB — BASIC METABOLIC PANEL
ANION GAP: 4 — AB (ref 5–15)
BUN: 26 mg/dL — AB (ref 6–20)
CHLORIDE: 116 mmol/L — AB (ref 101–111)
CO2: 23 mmol/L (ref 22–32)
Calcium: 8.1 mg/dL — ABNORMAL LOW (ref 8.9–10.3)
Creatinine, Ser: 1.27 mg/dL — ABNORMAL HIGH (ref 0.44–1.00)
GFR calc Af Amer: 43 mL/min — ABNORMAL LOW (ref >60)
GFR, EST NON AFRICAN AMERICAN: 37 mL/min — AB (ref >60)
Glucose, Bld: 114 mg/dL — ABNORMAL HIGH (ref 65–99)
POTASSIUM: 4.6 mmol/L (ref 3.5–5.1)
SODIUM: 143 mmol/L (ref 135–145)

## 2015-04-05 LAB — GLUCOSE, CAPILLARY
GLUCOSE-CAPILLARY: 120 mg/dL — AB (ref 65–99)
GLUCOSE-CAPILLARY: 65 mg/dL (ref 65–99)
GLUCOSE-CAPILLARY: 153 mg/dL — AB (ref 65–99)
GLUCOSE-CAPILLARY: 186 mg/dL — AB (ref 65–99)
Glucose-Capillary: 149 mg/dL — ABNORMAL HIGH (ref 65–99)

## 2015-04-05 MED ORDER — DEXTROSE 50 % IV SOLN
INTRAVENOUS | Status: AC
  Filled 2015-04-05: qty 50

## 2015-04-05 MED ORDER — HYDRALAZINE HCL 20 MG/ML IJ SOLN: 10.0000 mg | Freq: Four times a day (QID) | INTRAMUSCULAR | Status: DC | PRN

## 2015-04-05 MED ORDER — IPRATROPIUM-ALBUTEROL 0.5-2.5 (3) MG/3ML IN SOLN: 3.0000 mL | RESPIRATORY_TRACT | Status: DC | PRN

## 2015-04-05 NOTE — Progress Notes (Signed)
Hypoglycemic Event  CBG:65  Treatment  8 0z of apple juice plus dinner  Symptoms: none  Follow-up CBG: Time:1828 CBG Result: 153  Possible Reasons for Event: unknown  Comments/MD notified:yes    [place], [name]

## 2015-04-05 NOTE — Progress Notes (Signed)
Pt ready to go home,IV infiltrated pt refused to have another IV put in ,explained the need for IV Fluids pt stated she drinks enough fluids and does not want IV restarted,pt refused the bed alarm on explained fall risk pt stated I don"t need any help from no one I can do it my self.Will continue to monitor

## 2015-04-05 NOTE — Progress Notes (Signed)
[facility] PROGRESS NOTE  [name]:[id number] DOB: 13-Oct-1927 DOA: 04/02/2015 PCP: Kimber Relic, MD  Assessment/Plan: #1 acute hypoxic respiratory failure secondary to influenza A Patient admitted with acute hypoxic respiratory failure secondary to influenza A. Chest x-ray obtained and repeat chest x-ray was negative for any acute infiltrate. Influenza panel was positive for influenza A. Patient with some clinical improvement. Continue Tamiflu, IV fluids, supportive care. Follow.  #2 chronic kidney disease stage III Stable.  #3 atypical chest pain Likely secondary to influenza A as occur is with cough. Cardiac enzymes were negative 3. No further cardiac workup needed at this time. Follow.  #4 chronic diastolic CHF Currently stable. Patient is compensated. Continue gentle hydration. Continue to hold diuretics.  #5 coronary artery disease Status post stent to RCA in 2005. Currently stable. Cardiac enzymes negative 3. Continue low-dose aspirin. Follow.  #6 hypertension Stable. Continue Coreg, clonidine, Cozaar.  #7 diabetes mellitus Hemoglobin A1c is 7.5. CBGs have ranged from 120 -167. Continue sliding scale insulin.  #8 anxiety disorder Stable. Follow.  #9 hyperlipidemia Stable. Continue lovaza  #10 prophylaxis Lovenox for DVT prophylaxis.  Code Status: Full Family Communication: Updated patient. No family at bedside. Disposition Plan: Remain inpatient. Home versus skilled nursing facility when medically stable.   Consultants:  None  Procedures:  Chest x-ray 04/02/2015, 04/03/2015  Antibiotics:  IV azithromycin 04/02/2015>>>> 04/03/2015  IV Rocephin 04/02/2015>>>>> 04/03/2015  HPI/Subjective: Patient states some improvement since admission. Still feels not too well.  Objective: Filed Vitals:   04/05/15 0648 04/05/15 0804  BP: 195/44 141/38  Pulse: 62 60  Temp: 98.4 F (36.9 C) 98.7 F (37.1 C)  Resp: 22 18    Intake/Output  Summary (Last 24 hours) at 04/05/15 1506 Last data filed at 04/05/15 1410  Gross per 24 hour  Intake    980 ml  Output    600 ml  Net    380 ml   Filed Weights   04/02/15 1723 04/03/15 1934 04/04/15 2057  Weight: 64.6 kg (142 lb 6.7 oz) 60 kg (132 lb 4.4 oz) 64 kg (141 lb 1.5 oz)    Exam:   General:  NAD  Cardiovascular: RRR  Respiratory: CTAB  Abdomen: Soft, nontender, nondistended, positive bowel sounds.  Musculoskeletal: No clubbing cyanosis or edema.  Data Reviewed: Basic Metabolic Panel:  Recent Labs Lab 04/02/15 0913 04/03/15 0310 04/04/15 0505 04/05/15 0505  NA 141 145 144 143  K 4.6 4.2 4.1 4.6  CL 106 112* 114* 116*  CO2 23 24 23 23   GLUCOSE 173* 109* 109* 114*  BUN 40* 31* 23* 26*  CREATININE 1.65* 1.41* 1.25* 1.27*  CALCIUM 8.8* 8.2* 8.1* 8.1*   Liver Function Tests:  Recent Labs Lab 04/02/15 0913  AST 21  ALT 13*  ALKPHOS 75  BILITOT 0.7  PROT 6.8  ALBUMIN 3.4*   No results for input(s): LIPASE, AMYLASE in the last 168 hours. No results for input(s): AMMONIA in the last 168 hours. CBC:  Recent Labs Lab 04/02/15 0913 04/03/15 0310 04/04/15 0505  WBC 4.3 4.2 3.7*  NEUTROABS 2.7 2.3  --   HGB 12.2 11.3* 9.7*  HCT 38.1 35.1* 30.8*  MCV 95.0 95.9 97.5  PLT 152 135* 118*   Cardiac Enzymes:  Recent Labs Lab 04/02/15 1711 04/02/15 2009 04/03/15 0310  TROPONINI <0.03 0.03 0.03   BNP (last 3 results)  Recent Labs  06/03/14 1838 04/02/15 0913  BNP 530.3* 125.4*    ProBNP (last 3 results) No results for  input(s): PROBNP in the last 8760 hours.  CBG:  Recent Labs Lab 04/04/15 1134 04/04/15 1628 04/04/15 2046 04/05/15 0803 04/05/15 1201  GLUCAP 199* 215* 167* 120* 149*    Recent Results (from the past 240 hour(s))  Respiratory virus panel     Status: Abnormal   Collection Time: 04/02/15 11:19 AM  Result Value Ref Range Status   Respiratory Syncytial Virus A Negative Negative Final   Respiratory Syncytial Virus  B Negative Negative Final   Influenza A Positive (A) Negative Final    Comment: Subtype: H3   Influenza B Negative Negative Final   Parainfluenza 1 Negative Negative Final   Parainfluenza 2 Negative Negative Final   Parainfluenza 3 Negative Negative Final   Metapneumovirus Negative Negative Final   Rhinovirus Negative Negative Final   Adenovirus Negative Negative Final    Comment: (NOTE) Performed At: [facility] [address] [place], [place] [id number] Mila Homer MD WU:[id number]   Urine culture     Status: None   Collection Time: 04/02/15 11:35 AM  Result Value Ref Range Status   Specimen Description URINE, RANDOM  Final   Special Requests NONE  Final   Culture MULTIPLE SPECIES PRESENT, SUGGEST RECOLLECTION  Final   Report Status 04/03/2015 FINAL  Final  Culture, blood (Routine X 2) w Reflex to ID Panel     Status: None (Preliminary result)   Collection Time: 04/02/15  2:00 PM  Result Value Ref Range Status   Specimen Description BLOOD RIGHT FOREARM  Final   Special Requests BOTTLES DRAWN AEROBIC AND ANAEROBIC 5CCS  Final   Culture NO GROWTH 3 DAYS  Final   Report Status PENDING  Incomplete  Culture, blood (Routine X 2) w Reflex to ID Panel     Status: None (Preliminary result)   Collection Time: 04/02/15  2:05 PM  Result Value Ref Range Status   Specimen Description BLOOD RIGHT HAND  Final   Special Requests BOTTLES DRAWN AEROBIC AND ANAEROBIC 5CCS  Final   Culture NO GROWTH 3 DAYS  Final   Report Status PENDING  Incomplete     Studies: No results found.  Scheduled Meds: . antiseptic oral rinse  7 mL Mouth Rinse BID  . aspirin EC  81 mg Oral Daily  . capsicum   Topical BID  . carvedilol  3.125 mg Oral BID WC  . cloNIDine  0.1 mg Oral Daily  . enoxaparin (LOVENOX) injection  30 mg Subcutaneous Q24H  . fluticasone  2 spray Each Nare Daily  . guaiFENesin  600 mg Oral BID  . insulin aspart  0-15 Units Subcutaneous TID WC  . insulin aspart  0-5 Units  Subcutaneous QHS  . ketorolac  1 drop Left Eye QID  . loratadine  10 mg Oral Daily  . losartan  50 mg Oral Daily  . omega-3 acid ethyl esters  1 g Oral BID  . oseltamivir  30 mg Oral Daily  . timolol  1 drop Left Eye Daily   Continuous Infusions:    Principal Problem:   Acute respiratory failure with hypoxia (HCC) Active Problems:   Influenza A   Coronary artery disease   Chronic diastolic heart failure, NYHA class 1 (HCC)   Hyperlipidemia   Hypertension   Diabetes mellitus with nephropathy (HCC)   Anxiety disorder   Chronic kidney disease, stage III (moderate)   Acute URI    Time spent: 35 minutes    Aaliyha Mumford M.D. [facility] Pager [phone]. If 7PM-7AM, please contact  night-coverage at [email or web address], password [email or web address] 04/05/2015, 3:06 PM  LOS: 3 days

## 2015-04-06 DIAGNOSIS — E1121 Type 2 diabetes mellitus with diabetic nephropathy: Secondary | ICD-10-CM | POA: Diagnosis not present

## 2015-04-06 DIAGNOSIS — E785 Hyperlipidemia, unspecified: Secondary | ICD-10-CM | POA: Diagnosis not present

## 2015-04-06 DIAGNOSIS — J09X2 Influenza due to identified novel influenza A virus with other respiratory manifestations: Secondary | ICD-10-CM | POA: Diagnosis not present

## 2015-04-06 DIAGNOSIS — N183 Chronic kidney disease, stage 3 (moderate): Secondary | ICD-10-CM | POA: Diagnosis not present

## 2015-04-06 DIAGNOSIS — J9601 Acute respiratory failure with hypoxia: Secondary | ICD-10-CM | POA: Diagnosis not present

## 2015-04-06 LAB — CBC
HCT: 30.9 % — ABNORMAL LOW (ref 36.0–46.0)
HEMOGLOBIN: 9.8 g/dL — AB (ref 12.0–15.0)
MCH: 29.9 pg (ref 26.0–34.0)
MCHC: 31.7 g/dL (ref 30.0–36.0)
MCV: 94.2 fL (ref 78.0–100.0)
Platelets: 136 10*3/uL — ABNORMAL LOW (ref 150–400)
RBC: 3.28 MIL/uL — AB (ref 3.87–5.11)
RDW: 13.2 % (ref 11.5–15.5)
WBC: 3.8 10*3/uL — AB (ref 4.0–10.5)

## 2015-04-06 LAB — GLUCOSE, CAPILLARY
GLUCOSE-CAPILLARY: 115 mg/dL — AB (ref 65–99)
Glucose-Capillary: 146 mg/dL — ABNORMAL HIGH (ref 65–99)

## 2015-04-06 LAB — BASIC METABOLIC PANEL
ANION GAP: 8 (ref 5–15)
BUN: 25 mg/dL — ABNORMAL HIGH (ref 6–20)
CALCIUM: 8.4 mg/dL — AB (ref 8.9–10.3)
CHLORIDE: 114 mmol/L — AB (ref 101–111)
CO2: 22 mmol/L (ref 22–32)
CREATININE: 1.25 mg/dL — AB (ref 0.44–1.00)
GFR calc non Af Amer: 38 mL/min — ABNORMAL LOW (ref >60)
GFR, EST AFRICAN AMERICAN: 44 mL/min — AB (ref >60)
Glucose, Bld: 117 mg/dL — ABNORMAL HIGH (ref 65–99)
Potassium: 4.2 mmol/L (ref 3.5–5.1)
SODIUM: 144 mmol/L (ref 135–145)

## 2015-04-06 MED ORDER — BENZONATATE 100 MG PO CAPS: 100.0000 mg | ORAL_CAPSULE | Freq: Three times a day (TID) | ORAL | Status: DC

## 2015-04-06 MED ORDER — LORATADINE 10 MG PO TABS: 10.0000 mg | ORAL_TABLET | Freq: Every day | ORAL | Status: DC

## 2015-04-06 MED ORDER — ALBUTEROL SULFATE HFA 108 (90 BASE) MCG/ACT IN AERS: 2.0000 | INHALATION_SPRAY | Freq: Four times a day (QID) | RESPIRATORY_TRACT | Status: DC | PRN

## 2015-04-06 MED ORDER — FLUTICASONE PROPIONATE 50 MCG/ACT NA SUSP: 2.0000 | Freq: Every day | NASAL | Status: DC

## 2015-04-06 MED ORDER — OSELTAMIVIR PHOSPHATE 30 MG PO CAPS: 30.0000 mg | ORAL_CAPSULE | Freq: Two times a day (BID) | ORAL | Status: DC

## 2015-04-06 MED ORDER — SODIUM CHLORIDE 0.9 % IV SOLN
INTRAVENOUS | Status: DC
  Administered 2015-04-06: 14:00:00 via INTRAVENOUS

## 2015-04-06 NOTE — Care Management Note (Signed)
Case Management Note  Patient Details  Name: [name] MRN: [id number] Date of Birth: [birth year]  Subjective/Objective:                 CM following for progression and d/c planning.   Action/Plan: 04/06/2015 Met with pt re HH needs, pt lives with daughter who assist her in the home, [facility] selected for [facility] services. No DME needs.   Expected Discharge Date:       04/06/2015           Expected Discharge Plan:  [place]  In-House Referral:  NA  Discharge planning Services  CM Consult  Post Acute Care Choice:  Home Health Choice offered to:  Patient  DME Arranged:   NA DME Agency:  NA  HH Arranged:  PT, OT, Social Work, Nurse's Aide [place] Agency:  [place]  Status of Service:  Completed, signed off  Medicare Important Message Given:  Yes Date Medicare IM Given:    Medicare IM give by:    Date Additional Medicare IM Given:    Additional Medicare Important Message give by:     If discussed at [place] of Stay Meetings, dates discussed:    Additional Comments:  Adron Bene, RN 04/06/2015, 3:47 PM

## 2015-04-06 NOTE — Care Management Important Message (Signed)
Important Message  Patient Details  Name: [name] MRN: [id number] Date of Birth: 1928-02-23   Medicare Important Message Given:  Yes    Makylah Bossard, Annamarie Majorheryl U, RN 04/06/2015, 10:40 AM

## 2015-04-06 NOTE — Discharge Summary (Signed)
Physician Discharge Summary  [name] RSW:[id number] DOB: [birth year] DOA: 04/02/2015  PCP: Estill Dooms, MD  Admit date: 04/02/2015 Discharge date: 04/06/2015  Time spent: 65 minutes  Recommendations for Outpatient Follow-up:  1. Follow-up with GREEN, Viviann Spare, MD in 1 week. I'll follow up patient in need a basic metabolic profile done to follow-up on electrolytes and renal function.  Discharge Diagnoses:  Principal Problem:   Acute respiratory failure with hypoxia (HCC) Active Problems:   Influenza A   Coronary artery disease   Chronic diastolic heart failure, NYHA class 1 (HCC)   Hyperlipidemia   Hypertension   Diabetes mellitus with nephropathy (HCC)   Anxiety disorder   Chronic kidney disease, stage III (moderate)   Acute URI   Discharge Condition: Stable and improved.  Diet recommendation: Heart healthy.  Filed Weights   04/03/15 1934 04/04/15 2057 04/05/15 2133  Weight: 60 kg (132 lb 4.4 oz) 64 kg (141 lb 1.5 oz) 64 kg (141 lb 1.5 oz)    History of present illness:  Per Dr Waldron Labs This is an 82 year old female patient history diabetes diet-controlled, chronic kidney disease in setting of diabetic nephropathy, peripheral vascular disease, hypertension, anxiety disorder, remote CAD with history of distal RCA stent in 2005, chronic anemia, hyperlipidemia and chronic lower extremity pain likely related to diabetes and neuropathy. Patient presented to the ER with 2-3 days of upper respiratory infection symptoms consisting of cough that has become more copious and thick in the past 24 hours. Patient did not look at her expectorant and just threw it in the trash or toilet. She'd been having chills without apparent fever. Her eyes were red and watery. She's not had any nausea, vomiting or diarrhea. Had low-grade fevers. She was taking over-the-counter remedies without improvement in her symptoms. Her family felt she was more lethargic on the day of admission, so brought her  to the ER for evaluation. Patient has also been having pleuritic type chest pain with coughing and increased with deep breathing.  Hospital Course:  #1 acute hypoxic respiratory failure secondary to influenza A Patient admitted with acute hypoxic respiratory failure secondary to influenza A. Chest x-ray obtained and repeat chest x-ray was negative for any acute infiltrate. Influenza panel was positive for influenza A. Patient was admitted placed on IV fluids and empirically on IV antibiotics while flu culture results are pending. Chest x-ray was negative for any acute infiltrate. Influenza panel came back positive for influenza A. Patient was placed on Tamiflu, IV fluids, supportive care. Patient improved clinically and will be discharged home on 1 more day of oral Tamiflu. Outpatient follow-up.   #2 chronic kidney disease stage III Stable.  #3 atypical chest pain Likely secondary to influenza A as occur is with cough. Cardiac enzymes were negative 3. No further cardiac workup needed at this time. Follow.  #4 chronic diastolic CHF Currently stable. Patient on admission was somewhat dehydrated. Patient was hydrated with IV fluids and diuretics held. Patient was euvolemic by day of discharge.  #5 coronary artery disease Status post stent to RCA in 2005. Currently stable. Cardiac enzymes negative 3. Continued on low-dose aspirin. Follow.  #6 hypertension Stable. Continued on home regimen of Coreg, clonidine, Cozaar.  #7 diabetes mellitus Hemoglobin A1c is 7.5. CBGs have ranged from 120 -167. Patient was maintained on a sliding scale insulin during the hospitalization.   #8 anxiety disorder Stable. Follow.  #9 hyperlipidemia Stable. Continued on home regimen of lovaza   Procedures:  Chest x-ray 04/02/2015, 04/03/2015  Consultations:  None  Discharge Exam: Filed Vitals:   04/06/15 0915 04/06/15 1138  BP: 194/60 171/40  Pulse: 68 68  Temp: 98.3 F (36.8 C)   Resp: 18      General: NAD Cardiovascular: RRR Respiratory: CTAB  Discharge Instructions   Discharge Instructions    Diet general    Complete by:  As directed      Discharge instructions    Complete by:  As directed   Follow up with GREEN, Viviann Spare, MD in 1 week.     Increase activity slowly    Complete by:  As directed           Current Discharge Medication List    START taking these medications   Details  albuterol (PROVENTIL HFA;VENTOLIN HFA) 108 (90 Base) MCG/ACT inhaler Inhale 2 puffs into the lungs every 6 (six) hours as needed for wheezing or shortness of breath. Qty: 1 Inhaler, Refills: 2    benzonatate (TESSALON PERLES) 100 MG capsule Take 1 capsule (100 mg total) by mouth 3 (three) times daily. Take for 4 days then use as needed. Qty: 20 capsule, Refills: 0    fluticasone (FLONASE) 50 MCG/ACT nasal spray Place 2 sprays into both nostrils daily. Use for 3 days then stop. Qty: 15.8 g, Refills: 0    loratadine (CLARITIN) 10 MG tablet Take 1 tablet (10 mg total) by mouth daily. Qty: 30 tablet, Refills: 0    oseltamivir (TAMIFLU) 30 MG capsule Take 1 capsule (30 mg total) by mouth 2 (two) times daily. Take for 1 day and stop. Qty: 2 capsule, Refills: 0      CONTINUE these medications which have NOT CHANGED   Details  aspirin 81 MG tablet Take 81 mg by mouth daily. For heart health    Bromfenac Sodium (PROLENSA) 0.07 % SOLN Apply 1 drop to eye daily. Left eye    Capsaicin-Menthol-Methyl Sal (CAPSAICIN-METHYL SAL-MENTHOL) 0.025-1-12 % CREA Apply 1 application topically 2 (two) times daily. Qty: 60 g, Refills: 2    cloNIDine (CATAPRES) 0.1 MG tablet Take 1 tablet (0.1 mg total) by mouth daily. For high blood pressure Qty: 30 tablet, Refills: 4    furosemide (LASIX) 20 MG tablet Take 1 tablet (20 mg total) by mouth daily. For swelling Qty: 30 tablet, Refills: 2    losartan (COZAAR) 50 MG tablet Take 1 tablet (50 mg total) by mouth daily. For high blood pressure Qty: 90  tablet, Refills: 2    LOTEMAX 0.5 % GEL Place 1 drop into the left eye 2 (two) times daily.    timolol (BETIMOL) 0.5 % ophthalmic solution Place 1 drop into the left eye daily.    Blood Glucose Monitoring Suppl (ONE TOUCH ULTRA SYSTEM KIT) W/DEVICE KIT E11.59 Check blood sugar daily as directed Qty: 1 each, Refills: 0    carvedilol (COREG) 3.125 MG tablet TAKE 1 TABLET BY MOUTH TWICE DAILY FOR HIGH BLOOD PRESSURE Qty: 60 tablet, Refills: 5    glucose blood test strip E11.59 Check blood sugar daily as directed Qty: 100 each, Refills: 3    nitroGLYCERIN (NITROSTAT) 0.4 MG SL tablet Dissolve one tablet under tongue as needed for chest pain. May repeat as needed every 5 minutes up to 3 doses. Seek medical attention if pain is not relieved after 3 doses Qty: 25 tablet, Refills: 11    omega-3 acid ethyl esters (LOVAZA) 1 G capsule TAKE 1 CAPSULE BY MOUTH EVERY MORNING AND 1 CAPSULE IN  EVENING Qty: 60 capsule, Refills:  [place] E11.59 Check blood sugar daily as directed Qty: 100 each, Refills: 3      STOP taking these medications     ezetimibe-simvastatin (VYTORIN) 10-20 MG per tablet      metFORMIN (GLUCOPHAGE-XR) 500 MG 24 hr tablet        Allergies  Allergen Reactions  . Percocet [Oxycodone-Acetaminophen] Other (See Comments)  . Sulfa Antibiotics Other (See Comments)    Tongue swells  . Tradjenta [Linagliptin]     Low back ache, resolved once medication stopped   . Penicillins Hives    Tolerates Ceftriaxone   Follow-up Information    Follow up with GREEN, Viviann Spare, MD. Schedule an appointment as soon as possible for a visit in 1 week.   Specialty:  Internal Medicine   Contact information:   [place] [address] [phone]        The results of significant diagnostics from this hospitalization (including imaging, microbiology, ancillary and laboratory) are listed below for reference.    Significant Diagnostic  Studies: Dg Chest 2 View  04/03/2015  CLINICAL DATA:  Hypoxia, productive cough, generalized weakness, decreased appetite, sore throat, nasal congestion and mild shortness of breath, hx of cad, htn, dm, hx of coronary angioplasty with stent 04/05/10 EXAM: CHEST  2 VIEW COMPARISON:  04/02/2015 FINDINGS: Heart size is upper limits normal. There are no focal consolidations or pleural effusions. Stable bronchitic changes. Mild subsegmental linear densities identified at the bases consistent with atelectasis or scarring. No pulmonary edema. IMPRESSION: 1. Stable scarring and bronchitic changes. 2.  No focal acute pulmonary abnormality. Electronically Signed   By: Nolon Nations M.D.   On: 04/03/2015 09:12   Dg Chest 2 View  04/02/2015  CLINICAL DATA:  Cough and weakness for the past several days, shortness of breath today. EXAM: CHEST  2 VIEW COMPARISON:  PA and lateral chest x-ray of March 13, 2015 FINDINGS: The lungs are adequately inflated. There are stable mildly increased lung markings in the right perihilar region in lateral to the left heart border. The heart and pulmonary vascularity are normal. The mediastinum is normal in width. There is no pleural effusion. There is dense calcification in the wall of the thoracic aorta. There is prominent thoracic kyphosis without compression fracture. IMPRESSION: Stable scarring in both lungs. There is no alveolar pneumonia nor evidence of CHF. Mild chronic bronchitic changes are present and stable. Electronically Signed   By: David  Martinique M.D.   On: 04/02/2015 09:41   Dg Chest 2 View  03/13/2015  CLINICAL DATA:  82 year old with acute onset of bilateral lower extremity pain four days ago described as cramping in the calves and burning in the ankles, no recent injuries. Acute onset of nonproductive cough 2 days ago. EXAM: CHEST  2 VIEW COMPARISON:  06/07/2014 and earlier. FINDINGS: Cardiac silhouette moderately enlarged, unchanged. Thoracic aorta diffusely  atherosclerotic, unchanged. Hilar and mediastinal contours otherwise unremarkable. Linear scarring in the superior segment left lower lobe. Lungs otherwise clear. No localized airspace consolidation. No pleural effusions. No pneumothorax. Normal pulmonary vascularity. Osseous demineralization, degenerative changes involving the mid and lower thoracic spine and slight exaggeration of the usual thoracic kyphosis, unchanged. IMPRESSION: Stable moderate cardiomegaly and linear scarring in the superior segment left lower lobe. No acute cardiopulmonary disease. Electronically Signed   By: Evangeline Dakin M.D.   On: 03/13/2015 15:24    Microbiology: Recent Results (from the past 240 hour(s))  Respiratory virus panel  Status: Abnormal   Collection Time: 04/02/15 11:19 AM  Result Value Ref Range Status   Respiratory Syncytial Virus A Negative Negative Final   Respiratory Syncytial Virus B Negative Negative Final   Influenza A Positive (A) Negative Final    Comment: Subtype: H3   Influenza B Negative Negative Final   Parainfluenza 1 Negative Negative Final   Parainfluenza 2 Negative Negative Final   Parainfluenza 3 Negative Negative Final   Metapneumovirus Negative Negative Final   Rhinovirus Negative Negative Final   Adenovirus Negative Negative Final    Comment: (NOTE) Performed At: [facility] [address] [place], [place] [id number] Lindon Romp MD BS:[id number]   Urine culture     Status: None   Collection Time: 04/02/15 11:35 AM  Result Value Ref Range Status   Specimen Description URINE, RANDOM  Final   Special Requests NONE  Final   Culture MULTIPLE SPECIES PRESENT, SUGGEST RECOLLECTION  Final   Report Status 04/03/2015 FINAL  Final  Culture, blood (Routine X 2) w Reflex to ID Panel     Status: None (Preliminary result)   Collection Time: 04/02/15  2:00 PM  Result Value Ref Range Status   Specimen Description BLOOD RIGHT FOREARM  Final   Special Requests BOTTLES  DRAWN AEROBIC AND ANAEROBIC 5CCS  Final   Culture NO GROWTH 4 DAYS  Final   Report Status PENDING  Incomplete  Culture, blood (Routine X 2) w Reflex to ID Panel     Status: None (Preliminary result)   Collection Time: 04/02/15  2:05 PM  Result Value Ref Range Status   Specimen Description BLOOD RIGHT HAND  Final   Special Requests BOTTLES DRAWN AEROBIC AND ANAEROBIC 5CCS  Final   Culture NO GROWTH 4 DAYS  Final   Report Status PENDING  Incomplete     Labs: Basic Metabolic Panel:  Recent Labs Lab 04/02/15 0913 04/03/15 0310 04/04/15 0505 04/05/15 0505 04/06/15 0546  NA 141 145 144 143 144  K 4.6 4.2 4.1 4.6 4.2  CL 106 112* 114* 116* 114*  CO2 '23 24 23 23 22  ' GLUCOSE 173* 109* 109* 114* 117*  BUN 40* 31* 23* 26* 25*  CREATININE 1.65* 1.41* 1.25* 1.27* 1.25*  CALCIUM 8.8* 8.2* 8.1* 8.1* 8.4*   Liver Function Tests:  Recent Labs Lab 04/02/15 0913  AST 21  ALT 13*  ALKPHOS 75  BILITOT 0.7  PROT 6.8  ALBUMIN 3.4*   No results for input(s): LIPASE, AMYLASE in the last 168 hours. No results for input(s): AMMONIA in the last 168 hours. CBC:  Recent Labs Lab 04/02/15 0913 04/03/15 0310 04/04/15 0505 04/06/15 0546  WBC 4.3 4.2 3.7* 3.8*  NEUTROABS 2.7 2.3  --   --   HGB 12.2 11.3* 9.7* 9.8*  HCT 38.1 35.1* 30.8* 30.9*  MCV 95.0 95.9 97.5 94.2  PLT 152 135* 118* 136*   Cardiac Enzymes:  Recent Labs Lab 04/02/15 1711 04/02/15 2009 04/03/15 0310  TROPONINI <0.03 0.03 0.03   BNP: BNP (last 3 results)  Recent Labs  06/03/14 1838 04/02/15 0913  BNP 530.3* 125.4*    ProBNP (last 3 results) No results for input(s): PROBNP in the last 8760 hours.  CBG:  Recent Labs Lab 04/05/15 1716 04/05/15 1828 04/05/15 2136 04/06/15 0809 04/06/15 1220  GLUCAP 65 153* 186* 115* 146*       Signed:  Kaileb Monsanto MD.  [facility] 04/06/2015, 3:39 PM

## 2015-04-06 NOTE — Evaluation (Signed)
Physical Therapy Evaluation Patient Details Name: [name] MRN: [id number] DOB: Jul 26, 1927 Today's Date: 04/06/2015   History of Present Illness  This is an 82 year old female patient history diabetes diet-controlled, chronic kidney disease in setting of diabetic nephropathy, peripheral vascular disease, hypertension, anxiety disorder, remote CAD with history of distal RCA stent in 2005, chronic anemia, hyperlipidemia and chronic lower extremity pain likely related to diabetes and neuropathy. Patient presents to the ER with 2-3 days of upper respiratory infection symptoms consisting of cough that has become more copious and thick in the past 24 hours. Pt found to have influenza A.  Clinical Impression  Pt eager to return home and functioning near baseline. Pt with decreased safety awareness and insight to deficits however functioned at supervision level. Recommend pt used RW for long distance ambulation as pt very unsafe with quad cane.     Follow Up Recommendations Home health PT;Supervision/Assistance - 24 hour    Equipment Recommendations  None recommended by PT    Recommendations for Other Services       Precautions / Restrictions Precautions Precautions: Fall Precaution Comments: droplet Restrictions Weight Bearing Restrictions: No      Mobility  Bed Mobility               General bed mobility comments: pt received sitting up in chair  Transfers Overall transfer level: Needs assistance Equipment used: None Transfers: Sit to/from Stand Sit to Stand: Min guard         General transfer comment: increased time, v/c's to push up from arm rest, used grab bar to pull up from toliet in bathroom  Ambulation/Gait Ambulation/Gait assistance: Min assist Ambulation Distance (Feet): 100 Feet Assistive device: Rolling walker (2 wheeled);Quad cane Gait Pattern/deviations: Step-through pattern;Decreased stride length Gait velocity: decreased Gait velocity  interpretation: Below normal speed for age/gender General Gait Details: Pt initially started walking with quad cane however pt not using it properly and dragging it behind her despite max v/c's. pt given RW pt more steady however reports "i don't have enough room in my house."  Stairs            Wheelchair Mobility    Modified Rankin (Stroke Patients Only)       Balance Overall balance assessment: Needs assistance         Standing balance support: During functional activity;Bilateral upper extremity supported Standing balance-Leahy Scale: Fair Standing balance comment: pt leaned on sink to wash hands                             Pertinent Vitals/Pain Pain Assessment: No/denies pain    Home Living Family/patient expects to be discharged to:: Private residence Living Arrangements: Children Available Help at Discharge: Family;Available 24 hours/day (daughter) Type of Home: House Home Access: Stairs to enter Entrance Stairs-Rails: Right;Left;Can reach both Entrance Stairs-Number of Steps: 4 Home Layout: One level Home Equipment: Walker - 2 wheels;Cane - quad;Bedside commode;Grab bars - toilet;Grab bars - tub/shower;Wheelchair - manual      Prior Function Level of Independence: Independent with assistive device(s)         Comments: pt used quad cane occasionally, did bathing/dressing and some cooking. pt did not drive/do grocery shopping     Hand Dominance        Extremity/Trunk Assessment   Upper Extremity Assessment: Generalized weakness           Lower Extremity Assessment: Generalized weakness      Cervical /  Trunk Assessment: Normal  Communication   Communication: HOH  Cognition Arousal/Alertness: Awake/alert Behavior During Therapy: WFL for tasks assessed/performed Overall Cognitive Status: Impaired/Different from baseline Area of Impairment: Safety/judgement         Safety/Judgement: Decreased awareness of safety;Decreased  awareness of deficits          General Comments General comments (skin integrity, edema, etc.): pt assisted to bathroom, pt independent with hygiene    Exercises        Assessment/Plan    PT Assessment Patient needs continued PT services  PT Diagnosis Difficulty walking;Generalized weakness   PT Problem List Decreased strength;Decreased activity tolerance;Decreased balance;Decreased mobility  PT Treatment Interventions DME instruction;Gait training;Functional mobility training;Therapeutic activities;Therapeutic exercise;Stair training   PT Goals (Current goals can be found in the Care Plan section) Acute Rehab PT Goals Patient Stated Goal: home today PT Goal Formulation: With patient Time For Goal Achievement: 04/13/15 Potential to Achieve Goals: Good    Frequency Min 2X/week   Barriers to discharge        Co-evaluation               End of Session Equipment Utilized During Treatment: Gait belt Activity Tolerance: Patient tolerated treatment well Patient left: in chair;with call bell/phone within reach Nurse Communication: Mobility status    Functional Assessment Tool Used: clinical judgement Functional Limitation: Mobility: Walking and moving around Mobility: Walking and Moving Around Current Status (Z6109): At least 1 percent but less than 20 percent impaired, limited or restricted Mobility: Walking and Moving Around Goal Status [phone]): At least 1 percent but less than 20 percent impaired, limited or restricted    Time: 1450-1515 PT Time Calculation (min) (ACUTE ONLY): 25 min   Charges:   PT Evaluation $PT Eval Low Complexity: 1 Procedure     PT G Codes:   PT G-Codes **NOT FOR INPATIENT CLASS** Functional Assessment Tool Used: clinical judgement Functional Limitation: Mobility: Walking and moving around Mobility: Walking and Moving Around Current Status (U9811): At least 1 percent but less than 20 percent impaired, limited or restricted Mobility:  Walking and Moving Around Goal Status [phone]): At least 1 percent but less than 20 percent impaired, limited or restricted    Marcene Brawn 04/06/2015, 4:01 PM   Lewis Shock, PT, DPT Pager #: [phone] Office #: [phone]

## 2015-04-06 NOTE — Progress Notes (Signed)
Pt refused RN from IV team to attempt to place access. Stated she was drinking "lots of ginger ale and did not need fluids". Educated pt on need for IV fluids as well as need to administer IV medication for her hypertension. Pt verbalized understanding and agreed to have access placed. Will consult IV team again as pt is hard stick.

## 2015-04-06 NOTE — Progress Notes (Signed)
Pt provided with discharge information including instruction on follow up appointments as well as new medications. Pt verbalized understanding of all information. IV was dc'd without complication. Pt escorted out via wheelchair by NT.

## 2015-04-07 LAB — CULTURE, BLOOD (ROUTINE X 2)
CULTURE: NO GROWTH
Culture: NO GROWTH

## 2015-04-08 ENCOUNTER — Ambulatory Visit: Payer: Medicare Other | Admitting: Internal Medicine

## 2015-04-09 ENCOUNTER — Ambulatory Visit (INDEPENDENT_AMBULATORY_CARE_PROVIDER_SITE_OTHER): Payer: Medicare Other | Admitting: Podiatry

## 2015-04-09 ENCOUNTER — Encounter: Payer: Self-pay | Admitting: Podiatry

## 2015-04-09 DIAGNOSIS — M79676 Pain in unspecified toe(s): Secondary | ICD-10-CM

## 2015-04-09 DIAGNOSIS — B351 Tinea unguium: Secondary | ICD-10-CM | POA: Diagnosis not present

## 2015-04-10 DIAGNOSIS — I130 Hypertensive heart and chronic kidney disease with heart failure and stage 1 through stage 4 chronic kidney disease, or unspecified chronic kidney disease: Secondary | ICD-10-CM | POA: Diagnosis not present

## 2015-04-10 DIAGNOSIS — I5032 Chronic diastolic (congestive) heart failure: Secondary | ICD-10-CM | POA: Diagnosis not present

## 2015-04-10 DIAGNOSIS — I2510 Atherosclerotic heart disease of native coronary artery without angina pectoris: Secondary | ICD-10-CM | POA: Diagnosis not present

## 2015-04-10 DIAGNOSIS — J09X2 Influenza due to identified novel influenza A virus with other respiratory manifestations: Secondary | ICD-10-CM | POA: Diagnosis not present

## 2015-04-11 NOTE — Progress Notes (Signed)
She presents today with a chief complaint of painful elongated toenails.  Objective: Vital signs are stable she is alert and oriented 3. Pulses are strongly palpable bilateral. Neurologic sensorium is intact per Semmes-Weinstein monofilament. Deep tendon reflexes are intact bilateral muscle strength is intact. Toenails are thick yellow dystrophic likely mycotic and painful on palpation.  Assessment: Pain in limb secondary to onychomycosis bilateral.  Plan: Discussed etiology pathology conservative versus surgical therapies. Debrided toenails 1 through 5 bilateral as a covered service secondary to pain.

## 2015-04-13 ENCOUNTER — Other Ambulatory Visit: Payer: Self-pay | Admitting: Nurse Practitioner

## 2015-04-14 ENCOUNTER — Encounter: Payer: Self-pay | Admitting: Internal Medicine

## 2015-04-14 ENCOUNTER — Ambulatory Visit (INDEPENDENT_AMBULATORY_CARE_PROVIDER_SITE_OTHER): Payer: Medicare Other | Admitting: Internal Medicine

## 2015-04-14 VITALS — BP 152/60 | HR 63 | Temp 97.6°F | Resp 20 | Ht 60.0 in | Wt 141.8 lb

## 2015-04-14 DIAGNOSIS — D649 Anemia, unspecified: Secondary | ICD-10-CM

## 2015-04-14 DIAGNOSIS — E1121 Type 2 diabetes mellitus with diabetic nephropathy: Secondary | ICD-10-CM | POA: Diagnosis not present

## 2015-04-14 DIAGNOSIS — J9601 Acute respiratory failure with hypoxia: Secondary | ICD-10-CM

## 2015-04-14 DIAGNOSIS — J101 Influenza due to other identified influenza virus with other respiratory manifestations: Secondary | ICD-10-CM

## 2015-04-14 DIAGNOSIS — I10 Essential (primary) hypertension: Secondary | ICD-10-CM

## 2015-04-14 DIAGNOSIS — N183 Chronic kidney disease, stage 3 unspecified: Secondary | ICD-10-CM

## 2015-04-14 MED ORDER — LOTEMAX 0.5 % OP GEL: 1.0000 [drp] | Freq: Two times a day (BID) | OPHTHALMIC | Status: DC

## 2015-04-14 MED ORDER — METFORMIN HCL 500 MG PO TABS: ORAL_TABLET | ORAL | Status: DC

## 2015-04-14 MED ORDER — TIMOLOL HEMIHYDRATE 0.5 % OP SOLN: 1.0000 [drp] | Freq: Every day | OPHTHALMIC | Status: DC

## 2015-04-14 NOTE — Progress Notes (Signed)
Patient ID: [name], female   DOB: September 08, 1927, 82 y.o.   MRN: [id number]    Facility  [place]    Place of Service:   OFFICE    Allergies  Allergen Reactions  . Percocet [Oxycodone-Acetaminophen] Other (See Comments)  . Sulfa Antibiotics Other (See Comments)    Tongue swells  . Tradjenta [Linagliptin]     Low back ache, resolved once medication stopped   . Penicillins Hives    Tolerates Ceftriaxone    Chief Complaint  Patient presents with  . Hospitalization Follow-up    HPI:    Hospitalized 04/02/2015 through 04/06/2015. Patient presented to the emergency room with lethargy and acute hypoxic respiratory failure secondary to influenza a. Chest x-ray was negative for any acute infiltrate. Patient was treated with Tamiflu and she is feeling better. She denies any serious problems with dyspnea while she was hospitalized.  Chronic kidney disease, stage III (moderate) - remained stable  Diabetes mellitus with nephropathy (HCC) - stable  Essential hypertension mild elevation in SBP.  Acute respiratory failure with hypoxia (HCC) - resolved  Anemia, unspecified anemia type - chronic. Last Hgb 9.8   Medications: Patient's Medications  New Prescriptions   No medications on file  Previous Medications   ALBUTEROL (PROVENTIL HFA;VENTOLIN HFA) 108 (90 BASE) MCG/ACT INHALER    Inhale 2 puffs into the lungs every 6 (six) hours as needed for wheezing or shortness of breath.   ASPIRIN 81 MG TABLET    Take 81 mg by mouth daily. For heart health   BLOOD GLUCOSE MONITORING SUPPL (ONE TOUCH ULTRA SYSTEM KIT) W/DEVICE KIT    E11.59 Check blood sugar daily as directed   CAPSAICIN-MENTHOL-METHYL SAL (CAPSAICIN-METHYL SAL-MENTHOL) 0.025-1-12 % CREA    Apply 1 application topically 2 (two) times daily.   CARVEDILOL (COREG) 3.125 MG TABLET       CLONIDINE (CATAPRES) 0.1 MG TABLET    Take 1 tablet (0.1 mg total) by mouth daily. For high blood pressure   FLUTICASONE (FLONASE) 50 MCG/ACT NASAL  SPRAY    Place 2 sprays into both nostrils daily. Use for 3 days then stop.   FUROSEMIDE (LASIX) 20 MG TABLET    Take 1 tablet (20 mg total) by mouth daily. For swelling   GLUCOSE BLOOD TEST STRIP    E11.59 Check blood sugar daily as directed   LORATADINE (CLARITIN) 10 MG TABLET    Take 1 tablet (10 mg total) by mouth daily.   LOSARTAN (COZAAR) 50 MG TABLET    Take 1 tablet (50 mg total) by mouth daily. For high blood pressure   METFORMIN (GLUCOPHAGE-XR) 500 MG 24 HR TABLET       NITROGLYCERIN (NITROSTAT) 0.4 MG SL TABLET    Dissolve one tablet under tongue as needed for chest pain. May repeat as needed every 5 minutes up to 3 doses. Seek medical attention if pain is not relieved after 3 doses   ONETOUCH DELICA LANCETS FINE MISC    E11.59 Check blood sugar daily as directed   PROLENSA 0.07 % SOLN       TIMOLOL (TIMOPTIC) 0.5 % OPHTHALMIC SOLUTION       VYTORIN 10-20 MG TABLET    TAKE 1/2 TABLET BY MOUTH EVERY NIGHT AT BEDTIME FOR 30 DAYS  Modified Medications   Modified Medication Previous Medication   LOTEMAX 0.5 % GEL LOTEMAX 0.5 % GEL      Place 1 drop into the left eye 2 (two) times daily.    Place 1  drop into the left eye 2 (two) times daily.   TIMOLOL (BETIMOL) 0.5 % OPHTHALMIC SOLUTION timolol (BETIMOL) 0.5 % ophthalmic solution      Place 1 drop into the left eye daily.    Place 1 drop into the left eye daily.  Discontinued Medications   BENZONATATE (TESSALON PERLES) 100 MG CAPSULE    Take 1 capsule (100 mg total) by mouth 3 (three) times daily. Take for 4 days then use as needed.   BROMFENAC SODIUM (PROLENSA) 0.07 % SOLN    Apply 1 drop to eye daily. Left eye   CARVEDILOL (COREG) 3.125 MG TABLET    TAKE 1 TABLET BY MOUTH TWICE DAILY FOR HIGH BLOOD PRESSURE   OMEGA-3 ACID ETHYL ESTERS (LOVAZA) 1 G CAPSULE    TAKE 1 CAPSULE BY MOUTH EVERY MORNING AND 1 CAPSULE IN  EVENING   OSELTAMIVIR (TAMIFLU) 30 MG CAPSULE    Take 1 capsule (30 mg total) by mouth 2 (two) times daily. Take for 1 day  and stop.    Review of Systems  Constitutional: Negative for fever, chills, diaphoresis, activity change, appetite change, fatigue and unexpected weight change.  HENT: Negative for congestion, ear discharge, ear pain, hearing loss, postnasal drip, rhinorrhea, sore throat, tinnitus, trouble swallowing and voice change.   Eyes: Positive for visual disturbance (corrective lenses). Negative for pain, redness and itching.  Respiratory: Negative for cough, choking, shortness of breath and wheezing.   Cardiovascular: Positive for leg swelling. Negative for chest pain and palpitations.  Gastrointestinal: Positive for abdominal pain and constipation. Negative for nausea, diarrhea, abdominal distention, anal bleeding and rectal pain.  Endocrine: Negative for cold intolerance, heat intolerance, polydipsia, polyphagia and polyuria.  Genitourinary: Negative for dysuria, urgency, frequency, hematuria, flank pain, vaginal discharge, difficulty urinating and pelvic pain.       Mass at rectum following straining  Musculoskeletal: Negative for myalgias, back pain, arthralgias, gait problem, neck pain and neck stiffness.  Skin: Negative for color change, pallor, rash and wound.  Allergic/Immunologic: Negative.   Neurological: Negative for dizziness, tremors, seizures, syncope, weakness, light-headedness, numbness and headaches.  Hematological: Negative for adenopathy. Does not bruise/bleed easily.  Psychiatric/Behavioral: Negative for suicidal ideas, hallucinations, behavioral problems, confusion, sleep disturbance, dysphoric mood and agitation. The patient is not nervous/anxious and is not hyperactive.     Filed Vitals:   04/14/15 1500  BP: 152/60  Pulse: 63  Temp: 97.6 F (36.4 C)  TempSrc: Oral  Resp: 20  Height: 5' (1.524 m)  Weight: 141 lb 12.8 oz (64.32 kg)  SpO2: 97%   Body mass index is 27.69 kg/(m^2). Filed Weights   04/14/15 1500  Weight: 141 lb 12.8 oz (64.32 kg)     Physical Exam    Constitutional: She is oriented to person, place, and time. She appears well-developed and well-nourished. No distress.  Frail elderly female, NAD  HENT:  Head: Normocephalic and atraumatic.  Right Ear: External ear normal.  Left Ear: External ear normal.  Nose: Nose normal.  Mouth/Throat: Oropharynx is clear and moist. No oropharyngeal exudate.  Eyes: Conjunctivae and EOM are normal. Pupils are equal, round, and reactive to light. No scleral icterus.  Neck: Normal range of motion. Neck supple. No JVD present. No tracheal deviation present. No thyromegaly present.  Cardiovascular: Normal rate, regular rhythm, normal heart sounds and intact distal pulses.  Exam reveals no gallop and no friction rub.   No murmur heard. Pulmonary/Chest: Effort normal and breath sounds normal. No respiratory distress. She has no wheezes.  She has no rales. She exhibits no tenderness.  Abdominal: Soft. Bowel sounds are normal. She exhibits no distension and no mass. There is no tenderness.  Genitourinary:  Loose anal sphincter. No palpable rectal mass. With straining, there is some mucosal eversion.  Musculoskeletal: Normal range of motion. She exhibits edema. She exhibits no tenderness.  Lymphadenopathy:    She has no cervical adenopathy.  Neurological: She is alert and oriented to person, place, and time. No cranial nerve deficit. Coordination normal.  Slow unstable gait  Skin: Skin is warm and dry. No rash noted. She is not diaphoretic. No erythema. No pallor.  Psychiatric: She has a normal mood and affect. Her behavior is normal. Judgment and thought content normal.    Labs reviewed: Lab Summary Latest Ref Rng 04/06/2015 04/05/2015 04/04/2015 04/03/2015  Hemoglobin 12.0 - 15.0 g/dL 9.8(L) (None) 9.7(L) 11.3(L)  Hematocrit 36.0 - 46.0 % 30.9(L) (None) 30.8(L) 35.1(L)  White count 4.0 - 10.5 K/uL 3.8(L) (None) 3.7(L) 4.2  Platelet count 150 - 400 K/uL 136(L) (None) 118(L) 135(L)  Sodium 135 - 145 mmol/L 144  143 144 145  Potassium 3.5 - 5.1 mmol/L 4.2 4.6 4.1 4.2  Calcium 8.9 - 10.3 mg/dL 8.4(L) 8.1(L) 8.1(L) 8.2(L)  Phosphorus - (None) (None) (None) (None)  Creatinine 0.44 - 1.00 mg/dL 1.25(H) 1.27(H) 1.25(H) 1.41(H)  AST - (None) (None) (None) (None)  Alk Phos - (None) (None) (None) (None)  Bilirubin - (None) (None) (None) (None)  Glucose 65 - 99 mg/dL 117(H) 114(H) 109(H) 109(H)  Cholesterol - (None) (None) (None) (None)  HDL cholesterol - (None) (None) (None) (None)  Triglycerides - (None) (None) (None) (None)  LDL Direct - (None) (None) (None) (None)  LDL Calc - (None) (None) (None) (None)  Total protein - (None) (None) (None) (None)  Albumin - (None) (None) (None) (None)   Lab Results  Component Value Date   TSH 4.25 05/01/2014   TSH 5.43* 04/02/2014   TSH 2.297 04/04/2010   Lab Results  Component Value Date   BUN 25* 04/06/2015   BUN 26* 04/05/2015   BUN 23* 04/04/2015   Lab Results  Component Value Date   HGBA1C 7.5* 04/02/2015   HGBA1C 7.8* 03/09/2015   HGBA1C 7.8* 12/04/2014    Assessment/Plan  1. Influenza A resolved  2. Chronic kidney disease, stage III (moderate) -CMP, future  3. Diabetes mellitus with nephropathy (HCC) - metFORMIN (GLUCOPHAGE) 500 MG tablet; One each morning to help control glucose  Dispense: 90 tablet; Refill: 3 - Hemoglobin A1c; Future - Comprehensive metabolic panel; Future - Microalbumin, urine; Future  4. Essential hypertension Continue current medications.  5. Acute respiratory failure with hypoxia (HCC) resolved  6. Anemia, unspecified anemia type -CBC, future

## 2015-04-14 NOTE — Patient Instructions (Addendum)
Resume metformin. Bring all your medications with you to the next visit.

## 2015-04-15 ENCOUNTER — Telehealth: Payer: Self-pay

## 2015-04-15 NOTE — Telephone Encounter (Signed)
Called and spoke to lisa and informed after talking to sharon who sent the order in for the 2 eye solutions, that the patient is to use both drugs she asked about.

## 2015-04-17 ENCOUNTER — Other Ambulatory Visit: Payer: Self-pay | Admitting: Nurse Practitioner

## 2015-04-24 ENCOUNTER — Encounter: Payer: Self-pay | Admitting: Nurse Practitioner

## 2015-04-24 ENCOUNTER — Ambulatory Visit (INDEPENDENT_AMBULATORY_CARE_PROVIDER_SITE_OTHER): Payer: Medicare Other | Admitting: Nurse Practitioner

## 2015-04-24 ENCOUNTER — Other Ambulatory Visit: Payer: Self-pay | Admitting: *Deleted

## 2015-04-24 VITALS — BP 122/62 | HR 64 | Ht 60.0 in | Wt 143.1 lb

## 2015-04-24 DIAGNOSIS — I259 Chronic ischemic heart disease, unspecified: Secondary | ICD-10-CM | POA: Diagnosis not present

## 2015-04-24 DIAGNOSIS — I5032 Chronic diastolic (congestive) heart failure: Secondary | ICD-10-CM | POA: Diagnosis not present

## 2015-04-24 DIAGNOSIS — I10 Essential (primary) hypertension: Secondary | ICD-10-CM | POA: Diagnosis not present

## 2015-04-24 DIAGNOSIS — I739 Peripheral vascular disease, unspecified: Secondary | ICD-10-CM | POA: Diagnosis not present

## 2015-04-24 NOTE — Progress Notes (Signed)
CARDIOLOGY OFFICE NOTE  Date:  04/24/2015    [name] Date of Birth: [birth year] Medical Record #[id number]  PCP:  Ashley Relic, MD  Cardiologist:  [name]    Chief Complaint  Patient presents with  . Coronary Artery Disease  . Hypertension  . Hyperlipidemia    Follow up visit - seen for Dr. Excell Walters    History of Present Illness: [name] is a 82 y.o. female who presents today for a follow up visit. Seen for Dr. Excell Walters.   She has multiple issues which include CAD, remote PCI, BMS to the RCA and DES to the DX in January of 2012. She has had longstanding diabetes, HTN, HLD, diastolic heart failure with EF 55 to 60% per cath back in 2012 but has been as low as 35%, PVD (followed by Dr. Darrick Walters), gout, CKD and chronic anemia.   I have known [name] for almost 20 years - always seems to have an issue with her social situation, her medicines or her doctors in some fashion.   Seen back in November of 2014 by PCP for some ongoing issues with anxiety/BP. Has had lisinopril added back along with some Xanax/Celexa. Having personal issues with a live in man that she does not want to evict as well as family issues in general. She has always cared for many animals in and at her home and had lots of issues with family.   I saw her back in January of 2015. Medicines had been adjusted for her BP. Still with lots of social issues with her home situation. I saw her in May of 2015- she had seen Dr. Darrick Walters recently for a foot ulcer - managed conservatively. She ultimately underwent bilateral common iliac stenting on January 17, 2014. 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 She did have a postprocedure hematoma in the groin that did resolve. She has been seen at the wound center for intermittent debridement of her right medial malleolus ulcer. She was placed in an  Radio broadcast assistant.   Seen in December of 2015 - off lots of her medicines - not clear as to why. BP was up. Clonidine and Norvasc were restarted. She was on a lower dose of ARB. On follow up by me in January - she was doing better. Cardiac status stable.   She came for a follow up in March of 2016- she was febrile - T of 102. Sent on to the ER - treated for UTI and possible pneumonia. I last saw her in August - she was doing ok but did have another foot ulcer.   She was admitted back in January with respiratory failure from Flu A. Seen by PCP about 10 days ago. She is anemic.   Comes in today. Here alone. She is very adamant about not having her daughters here in the room, especially "[name]". Her other daughter, [name], lives with her and brought her here today. Has cut her hair. Looks pale. She does not think she is on Metformin - not clear why - it is on her list. Feels better. No chest pain. Breathing ok. Using some cream on her legs when they start hurting - seems to get good relief. Out of her unna boot. Spends most of today's visit telling me how upset she is with [name] - not really clear as to why. She continues  to get great satisfaction from her 12 cats.    Past Medical History  Diagnosis Date  . Coronary artery disease     Remote PCI. Had BMS to RCA and DES stent to Diagonal in Jan. 2012   . Diastolic heart failure     EF 55 to 60% per cath in Jan 2012  . Hyperlipidemia   . Hypertension   . Carotid bruit   . Gout   . Obesity   . TIA (transient ischemic attack)   . Ischemic cardiomyopathy     with prior EF of 35%  . Anemia   . Proteinuria   . Carotid artery occlusion   . Stroke [facility]) 2013    ?  mini    . DVT (deep venous thrombosis) (HCC)   . NSTEMI (non-ST elevated myocardial infarction) [facility]) Jan 2012    with PCI to RCA and DX per Dr. Excell Walters  . Pneumonia 2016  . Type II diabetes mellitus (HCC)     long standing  . Chronic kidney disease (CKD), stage III (moderate)     Past  Surgical History  Procedure Laterality Date  . Appendectomy    . Tonsillectomy    . Ankle fusion Bilateral   . Coronary angioplasty with stent placement  04/05/2010    distal RCA  . Fracture surgery Right 2005    bilateral ankles   . Abdominal aortagram N/A 01/17/2014    Procedure: ABDOMINAL AORTAGRAM;  Surgeon: Ashley Kerns, MD;  Location: [facility] CATH LAB;  Service: Cardiovascular;  Laterality: N/A;  . Cataract extraction, bilateral Bilateral      Medications: Current Outpatient Prescriptions  Medication Sig Dispense Refill  . albuterol (PROVENTIL HFA;VENTOLIN HFA) 108 (90 Base) MCG/ACT inhaler Inhale 2 puffs into the lungs every 6 (six) hours as needed for wheezing or shortness of breath. 1 Inhaler 2  . aspirin 81 MG tablet Take 81 mg by mouth daily. For heart health    . Capsaicin-Menthol-Methyl Sal (CAPSAICIN-METHYL SAL-MENTHOL) 0.025-1-12 % CREA Apply 1 application topically 2 (two) times daily. 60 g 2  . carvedilol (COREG) 3.125 MG tablet     . cloNIDine (CATAPRES) 0.1 MG tablet Take 1 tablet (0.1 mg total) by mouth daily. For high blood pressure 30 tablet 4  . fluticasone (FLONASE) 50 MCG/ACT nasal spray Place 2 sprays into both nostrils daily. Use for 3 days then stop. 15.8 g 0  . furosemide (LASIX) 20 MG tablet Take 1 tablet (20 mg total) by mouth daily. For swelling 30 tablet 2  . glucose blood test strip E11.59 Check blood sugar daily as directed 100 each 3  . loratadine (CLARITIN) 10 MG tablet Take 1 tablet (10 mg total) by mouth daily. 30 tablet 0  . losartan (COZAAR) 50 MG tablet Take 1 tablet (50 mg total) by mouth daily. For high blood pressure 90 tablet 2  . LOTEMAX 0.5 % GEL Place 1 drop into the left eye 2 (two) times daily. 5 g 2  . nitroGLYCERIN (NITROSTAT) 0.4 MG SL tablet Dissolve one tablet under tongue as needed for chest pain. May repeat as needed every 5 minutes up to 3 doses. Seek medical attention if pain is not relieved after 3 doses 25 tablet 11  .  PROLENSA 0.07 % SOLN     . timolol (BETIMOL) 0.5 % ophthalmic solution Place 1 drop into the left eye daily. 10 mL 2  . VYTORIN 10-20 MG tablet TAKE 1/2 TABLET BY MOUTH EVERY NIGHT AT BEDTIME FOR 30  DAYS 45 tablet 1  . metFORMIN (GLUCOPHAGE) 500 MG tablet One each morning to help control glucose (Patient not taking: Reported on 04/24/2015) 90 tablet 3   No current facility-administered medications for this visit.    Allergies: Allergies  Allergen Reactions  . Percocet [Oxycodone-Acetaminophen] Other (See Comments)  . Sulfa Antibiotics Other (See Comments)    Tongue swells  . Tradjenta [Linagliptin]     Low back ache, resolved once medication stopped   . Penicillins Hives    Tolerates Ceftriaxone    Social History: The patient  reports that she has quit smoking. She has never used smokeless tobacco. She reports that she does not drink alcohol or use illicit drugs.   Family History: The patient's family history is negative for Heart disease. Her parents were murdered.   Review of Systems: Please see the history of present illness.   Otherwise, the review of systems is positive for none.   All other systems are reviewed and negative.   Physical Exam: VS:  BP 122/62 mmHg  Pulse 64  Ht 5' (1.524 m)  Wt 143 lb 1.9 oz (64.919 kg)  BMI 27.95 kg/m2 .  BMI Body mass index is 27.95 kg/(m^2).  Wt Readings from Last 3 Encounters:  04/24/15 143 lb 1.9 oz (64.919 kg)  04/14/15 141 lb 12.8 oz (64.32 kg)  04/05/15 141 lb 1.5 oz (64 kg)    General: Pleasant. Elderly female who is alert and in no acute distress.  HEENT: Normal. Neck: Supple, no JVD, carotid bruits, or masses noted.  Cardiac: Regular rate and rhythm. No murmurs, rubs, or gallops. No edema.  Respiratory:  Lungs are clear to auscultation bilaterally with normal work of breathing.  GI: Soft and nontender.  MS: No deformity or atrophy. Gait and ROM intact. Skin: Warm and dry. Color is normal.  Neuro:  Strength and sensation  are intact and no gross focal deficits noted.  Psych: Alert, appropriate and with normal affect.   LABORATORY DATA:  EKG:  EKG is not ordered today.  Lab Results  Component Value Date   WBC 3.8* 04/06/2015   HGB 9.8* 04/06/2015   HCT 30.9* 04/06/2015   PLT 136* 04/06/2015   GLUCOSE 117* 04/06/2015   CHOL 143 03/09/2015   TRIG 170* 03/09/2015   HDL 35* 03/09/2015   LDLCALC 74 03/09/2015   ALT 13* 04/02/2015   AST 21 04/02/2015   NA 144 04/06/2015   K 4.2 04/06/2015   CL 114* 04/06/2015   CREATININE 1.25* 04/06/2015   BUN 25* 04/06/2015   CO2 22 04/06/2015   TSH 4.25 05/01/2014   INR 1.09 04/15/2010   HGBA1C 7.5* 04/02/2015    BNP (last 3 results)  Recent Labs  06/03/14 1838 04/02/15 0913  BNP 530.3* 125.4*    ProBNP (last 3 results) No results for input(s): PROBNP in the last 8760 hours.   Other Studies Reviewed Today:   Assessment/Plan: 1. HTN - BP looks good on her current regimen. I have not made any changes.   2. CAD - no symptoms reported - will continue with medical management   3. HLD - on statin therapy  4. PVD - followed by [facility] with Dr. Darrick Walters   5. Recent Flu A - seems resolved.   6. Hyperkalemia - off ARB  7. Diastolic HF - she seems compensated to me. I have left her on current regimen.   Her home situation is always quite difficult to figure out. I think her cardiac status is  stable but this is probably the least of her concerns.   Current medicines are reviewed with the patient today.  The patient does not have concerns regarding medicines other than what has been noted above.  The following changes have been made:  See above.  Labs/ tests ordered today include:   No orders of the defined types were placed in this encounter.     Disposition:   FU with me in 6 months.   Patient is agreeable to this plan and will call if any problems develop in the interim.   Signed: Rosalio Macadamia, RN, ANP-C 04/24/2015 2:37 PM  [facility] [address] [place], [place]  [address] Phone: [phone] Fax: [phone]

## 2015-04-24 NOTE — Patient Instructions (Addendum)
We will be checking the following labs today - NONE   Medication Instructions:    Continue with your current medicines.     Testing/Procedures To Be Arranged:  N/A  Follow-Up:   See me in 6 months.    Other Special Instructions:   N/A    If you need a refill on your cardiac medications before your next appointment, please call your pharmacy.   Call the [facility] office at [phone] if you have any questions, problems or concerns.      

## 2015-04-28 ENCOUNTER — Ambulatory Visit: Payer: Medicare Other | Admitting: Nurse Practitioner

## 2015-05-12 ENCOUNTER — Telehealth: Payer: Self-pay

## 2015-05-12 NOTE — Telephone Encounter (Signed)
Received fax from pharmacy inquiring about patient use of the medication Amlodipine 5 mg tablet. . The fax stated "Patient reports this medication has been stopped. Please confirm d/c for our records."  Response to fax states "Confirmed; patient is no longer taking this medication. Coralie Carpen, CMA."  Form was faxed back to [phone].

## 2015-05-15 ENCOUNTER — Other Ambulatory Visit: Payer: Self-pay | Admitting: Nurse Practitioner

## 2015-05-22 ENCOUNTER — Other Ambulatory Visit: Payer: Medicare Other

## 2015-05-22 DIAGNOSIS — E1121 Type 2 diabetes mellitus with diabetic nephropathy: Secondary | ICD-10-CM

## 2015-05-23 LAB — COMPREHENSIVE METABOLIC PANEL
ALBUMIN: 3.9 g/dL (ref 3.5–4.7)
ALT: 7 IU/L (ref 0–32)
AST: 11 IU/L (ref 0–40)
Albumin/Globulin Ratio: 2 (ref 1.1–2.5)
Alkaline Phosphatase: 60 IU/L (ref 39–117)
BILIRUBIN TOTAL: 0.3 mg/dL (ref 0.0–1.2)
BUN / CREAT RATIO: 29 — AB (ref 11–26)
BUN: 44 mg/dL — AB (ref 8–27)
CO2: 22 mmol/L (ref 18–29)
CREATININE: 1.52 mg/dL — AB (ref 0.57–1.00)
Calcium: 9 mg/dL (ref 8.7–10.3)
Chloride: 107 mmol/L — ABNORMAL HIGH (ref 96–106)
GFR calc non Af Amer: 31 mL/min/{1.73_m2} — ABNORMAL LOW (ref >59)
GFR, EST AFRICAN AMERICAN: 36 mL/min/{1.73_m2} — AB (ref >59)
GLUCOSE: 178 mg/dL — AB (ref 65–99)
Globulin, Total: 2 g/dL (ref 1.5–4.5)
Potassium: 5.1 mmol/L (ref 3.5–5.2)
Sodium: 145 mmol/L — ABNORMAL HIGH (ref 134–144)
TOTAL PROTEIN: 5.9 g/dL — AB (ref 6.0–8.5)

## 2015-05-23 LAB — HEMOGLOBIN A1C
Est. average glucose Bld gHb Est-mCnc: 180 mg/dL
Hgb A1c MFr Bld: 7.9 % — ABNORMAL HIGH (ref 4.8–5.6)

## 2015-05-23 LAB — MICROALBUMIN, URINE: Microalbumin, Urine: 215 ug/mL

## 2015-05-26 ENCOUNTER — Ambulatory Visit (INDEPENDENT_AMBULATORY_CARE_PROVIDER_SITE_OTHER): Payer: Medicare Other | Admitting: Internal Medicine

## 2015-05-26 ENCOUNTER — Encounter: Payer: Self-pay | Admitting: Internal Medicine

## 2015-05-26 VITALS — BP 130/80 | HR 61 | Temp 97.4°F | Resp 20 | Ht 60.0 in | Wt 145.4 lb

## 2015-05-26 DIAGNOSIS — N183 Chronic kidney disease, stage 3 unspecified: Secondary | ICD-10-CM

## 2015-05-26 DIAGNOSIS — R4701 Aphasia: Secondary | ICD-10-CM | POA: Diagnosis not present

## 2015-05-26 DIAGNOSIS — I10 Essential (primary) hypertension: Secondary | ICD-10-CM

## 2015-05-26 DIAGNOSIS — D649 Anemia, unspecified: Secondary | ICD-10-CM

## 2015-05-26 DIAGNOSIS — E1121 Type 2 diabetes mellitus with diabetic nephropathy: Secondary | ICD-10-CM

## 2015-05-26 DIAGNOSIS — L03032 Cellulitis of left toe: Secondary | ICD-10-CM | POA: Diagnosis not present

## 2015-05-26 DIAGNOSIS — E785 Hyperlipidemia, unspecified: Secondary | ICD-10-CM | POA: Diagnosis not present

## 2015-05-26 DIAGNOSIS — I5032 Chronic diastolic (congestive) heart failure: Secondary | ICD-10-CM

## 2015-05-26 DIAGNOSIS — L03039 Cellulitis of unspecified toe: Secondary | ICD-10-CM | POA: Insufficient documentation

## 2015-05-26 MED ORDER — TIMOLOL HEMIHYDRATE 0.5 % OP SOLN: OPHTHALMIC | Status: DC

## 2015-05-26 MED ORDER — DOXYCYCLINE HYCLATE 100 MG PO TABS: ORAL_TABLET | ORAL | Status: DC

## 2015-05-26 MED ORDER — CARVEDILOL 3.125 MG PO TABS: ORAL_TABLET | ORAL | Status: DC

## 2015-05-26 MED ORDER — LOTEMAX 0.5 % OP GEL: OPHTHALMIC | Status: DC

## 2015-05-26 MED ORDER — METFORMIN HCL 500 MG PO TABS: ORAL_TABLET | ORAL | Status: DC

## 2015-05-26 MED ORDER — EZETIMIBE-SIMVASTATIN 10-20 MG PO TABS: ORAL_TABLET | ORAL | Status: DC

## 2015-05-26 NOTE — Progress Notes (Signed)
Patient ID: [name], female   DOB: 05/08/1927, 82 y.o.   MRN: [id number]    Facility  [facility]    Place of Service:   OFFICE    Allergies  Allergen Reactions  . Percocet [Oxycodone-Acetaminophen] Other (See Comments)  . Sulfa Antibiotics Other (See Comments)    Tongue swells  . Tradjenta [Linagliptin]     Low back ache, resolved once medication stopped   . Penicillins Hives    Tolerates Ceftriaxone    Chief Complaint  Patient presents with  . Medical Management of Chronic Issues    6 weeks  f/u-w /labs    HPI:   Complains of sore to the left great toe ever since she had toenail clipping about a week ago. Toe has become red and swollen and tender.  Diabetic control is worse. Patient has not been using metformin. She states she has never had that.  Blood pressure is controlled   Medications: Patient's Medications  New Prescriptions   No medications on file  Previous Medications   ALBUTEROL (PROVENTIL HFA;VENTOLIN HFA) 108 (90 BASE) MCG/ACT INHALER    Inhale 2 puffs into the lungs every 6 (six) hours as needed for wheezing or shortness of breath.   ASPIRIN 81 MG TABLET    Take 81 mg by mouth daily. For heart health   CAPSAICIN-MENTHOL-METHYL SAL (CAPSAICIN-METHYL SAL-MENTHOL) 0.025-1-12 % CREA    Apply 1 application topically 2 (two) times daily.   CARVEDILOL (COREG) 3.125 MG TABLET       CLONIDINE (CATAPRES) 0.1 MG TABLET    Take 1 tablet (0.1 mg total) by mouth daily. For high blood pressure   FLUTICASONE (FLONASE) 50 MCG/ACT NASAL SPRAY    Place 2 sprays into both nostrils daily. Use for 3 days then stop.   FUROSEMIDE (LASIX) 20 MG TABLET    Take 1 tablet (20 mg total) by mouth daily. For swelling   GLUCOSE BLOOD TEST STRIP    E11.59 Check blood sugar daily as directed   LORATADINE (CLARITIN) 10 MG TABLET    Take 1 tablet (10 mg total) by mouth daily.   LOSARTAN (COZAAR) 50 MG TABLET    Take 1 tablet (50 mg total) by mouth daily. For high blood pressure   LOTEMAX  0.5 % GEL    Place 1 drop into the left eye 2 (two) times daily.   METFORMIN (GLUCOPHAGE) 500 MG TABLET    One each morning to help control glucose   NITROGLYCERIN (NITROSTAT) 0.4 MG SL TABLET    Dissolve one tablet under tongue as needed for chest pain. May repeat as needed every 5 minutes up to 3 doses. Seek medical attention if pain is not relieved after 3 doses   POLYETHYLENE GLYCOL POWDER (GLYCOLAX/MIRALAX) POWDER    DISSOLVE 17 GRAMS (1 CAPFUL) INTO 8 OUNCES OF LIQUID AND DRINK EVERY DAY AS NEEDED FOR BOWELS   PROLENSA 0.07 % SOLN    Reported on 05/26/2015   TIMOLOL (BETIMOL) 0.5 % OPHTHALMIC SOLUTION    Place 1 drop into the left eye daily.   VYTORIN 10-20 MG TABLET    TAKE 1/2 TABLET BY MOUTH EVERY NIGHT AT BEDTIME FOR 30 DAYS  Modified Medications   No medications on file  Discontinued Medications   No medications on file    Review of Systems  Constitutional: Negative for fever, chills, diaphoresis, activity change, appetite change, fatigue and unexpected weight change.  HENT: Negative for congestion, ear discharge, ear pain, hearing loss, postnasal drip, rhinorrhea,  sore throat, tinnitus, trouble swallowing and voice change.   Eyes: Positive for visual disturbance (corrective lenses). Negative for pain, redness and itching.  Respiratory: Negative for cough, choking, shortness of breath and wheezing.   Cardiovascular: Positive for leg swelling. Negative for chest pain and palpitations.  Gastrointestinal: Positive for abdominal pain and constipation. Negative for nausea, diarrhea, abdominal distention, anal bleeding and rectal pain.  Endocrine: Negative for cold intolerance, heat intolerance, polydipsia, polyphagia and polyuria.  Genitourinary: Negative for dysuria, urgency, frequency, hematuria, flank pain, vaginal discharge, difficulty urinating and pelvic pain.       Mass at rectum following straining CKD 3  Musculoskeletal: Positive for gait problem. Negative for myalgias, back  pain, arthralgias, neck pain and neck stiffness.  Skin: Negative for color change, pallor, rash and wound.       Pain in the left great toe  Allergic/Immunologic: Negative.   Neurological: Negative for dizziness, tremors, seizures, syncope, weakness, light-headedness, numbness and headaches.  Hematological: Negative for adenopathy. Does not bruise/bleed easily.       Chronic Anemia  Psychiatric/Behavioral: Negative for suicidal ideas, hallucinations, behavioral problems, confusion, sleep disturbance, dysphoric mood and agitation. The patient is not nervous/anxious and is not hyperactive.     Filed Vitals:   05/26/15 1549  BP: 130/80  Pulse: 61  Temp: 97.4 F (36.3 C)  TempSrc: Oral  Resp: 20  Height: 5' (1.524 m)  Weight: 145 lb 6.4 oz (65.953 kg)  SpO2: 95%   Body mass index is 28.4 kg/(m^2). Filed Weights   05/26/15 1549  Weight: 145 lb 6.4 oz (65.953 kg)     Physical Exam  Constitutional: She is oriented to person, place, and time. She appears well-developed and well-nourished. No distress.  Frail elderly female, NAD  HENT:  Head: Normocephalic and atraumatic.  Right Ear: External ear normal.  Left Ear: External ear normal.  Nose: Nose normal.  Mouth/Throat: Oropharynx is clear and moist. No oropharyngeal exudate.  Eyes: Conjunctivae and EOM are normal. Pupils are equal, round, and reactive to light. No scleral icterus.  Neck: Normal range of motion. Neck supple. No JVD present. No tracheal deviation present. No thyromegaly present.  Cardiovascular: Normal rate, regular rhythm, normal heart sounds and intact distal pulses.  Exam reveals no gallop and no friction rub.   No murmur heard. Pulmonary/Chest: Effort normal and breath sounds normal. No respiratory distress. She has no wheezes. She has no rales. She exhibits no tenderness.  Abdominal: Soft. Bowel sounds are normal. She exhibits no distension and no mass. There is no tenderness.  Genitourinary:  Loose anal  sphincter. No palpable rectal mass. With straining, there is some mucosal eversion.  Musculoskeletal: Normal range of motion. She exhibits edema. She exhibits no tenderness.  Lymphadenopathy:    She has no cervical adenopathy.  Neurological: She is alert and oriented to person, place, and time. No cranial nerve deficit. Coordination normal.  Slow unstable gait  Skin: Skin is warm and dry. No rash noted. She is not diaphoretic. No erythema. No pallor.  Ingrown nail of the left great toe.  Psychiatric: She has a normal mood and affect. Her behavior is normal. Judgment and thought content normal.    Labs reviewed: Lab Summary Latest Ref Rng 05/22/2015 04/06/2015 04/05/2015 04/04/2015 04/03/2015  Hemoglobin 12.0 - 15.0 g/dL (None) 9.8(L) (None) 9.7(L) 11.3(L)  Hematocrit 36.0 - 46.0 % (None) 30.9(L) (None) 30.8(L) 35.1(L)  White count 4.0 - 10.5 K/uL (None) 3.8(L) (None) 3.7(L) 4.2  Platelet count 150 - 400 K/uL (  None) 136(L) (None) 118(L) 135(L)  Sodium 134 - 144 mmol/L 145(H) 144 143 144 145  Potassium 3.5 - 5.2 mmol/L 5.1 4.2 4.6 4.1 4.2  Calcium 8.7 - 10.3 mg/dL 9.0 8.4(L) 8.1(L) 8.1(L) 8.2(L)  Phosphorus - (None) (None) (None) (None) (None)  Creatinine 0.57 - 1.00 mg/dL 1.52(H) 1.25(H) 1.27(H) 1.25(H) 1.41(H)  AST 0 - 40 IU/L 11 (None) (None) (None) (None)  Alk Phos 39 - 117 IU/L 60 (None) (None) (None) (None)  Bilirubin 0.0 - 1.2 mg/dL 0.3 (None) (None) (None) (None)  Glucose 65 - 99 mg/dL 178(H) 117(H) 114(H) 109(H) 109(H)  Cholesterol - (None) (None) (None) (None) (None)  HDL cholesterol - (None) (None) (None) (None) (None)  Triglycerides - (None) (None) (None) (None) (None)  LDL Direct - (None) (None) (None) (None) (None)  LDL Calc - (None) (None) (None) (None) (None)  Total protein - (None) (None) (None) (None) (None)  Albumin 3.5 - 4.7 g/dL 3.9 (None) (None) (None) (None)   Lab Results  Component Value Date   TSH 4.25 05/01/2014   TSH 5.43* 04/02/2014   TSH 2.297 04/04/2010    Lab Results  Component Value Date   BUN 44* 05/22/2015   BUN 25* 04/06/2015   BUN 26* 04/05/2015   Lab Results  Component Value Date   HGBA1C 7.9* 05/22/2015   HGBA1C 7.5* 04/02/2015   HGBA1C 7.8* 03/09/2015    Assessment/Plan 1. Cellulitis of toe of left foot Sharply debrided in the office for removal of an ingrowing portion of the nail Doxycycline 100 mg twice daily for 10 days.  2. Expressive aphasia Says that she knows what she wants to say, but cannot find the word.  3. Diabetes mellitus with nephropathy (HCC) - metFORMIN (GLUCOPHAGE) 500 MG tablet; One each morning to help control glucose  Dispense: 90 tablet; Refill: 3 - Hemoglobin A1c; Future - Basic metabolic panel; Future  4. Essential hypertension - carvedilol (COREG) 3.125 MG tablet; One twice daily to control BP and strengthen the heart  Dispense: 60 tablet; Refill: 5 - Basic metabolic panel; Future  5. Chronic diastolic heart failure, NYHA class 1 (HCC) Compensated  6. Hyperlipidemia - ezetimibe-simvastatin (VYTORIN) 10-20 MG tablet; 1/2 tablet nightly to control cholesterol  Dispense: 45 tablet; Refill: 4  7. Anemia, unspecified anemia type Follow-up lab next visit  8. Chronic kidney disease, stage III (moderate) Monitor lab

## 2015-05-26 NOTE — Patient Instructions (Addendum)
Resume taking the metformin to control diabetes.

## 2015-06-08 ENCOUNTER — Ambulatory Visit (INDEPENDENT_AMBULATORY_CARE_PROVIDER_SITE_OTHER): Payer: Medicare Other | Admitting: Pharmacotherapy

## 2015-06-08 ENCOUNTER — Encounter: Payer: Self-pay | Admitting: Pharmacotherapy

## 2015-06-08 VITALS — BP 120/70 | HR 60 | Temp 97.3°F | Ht 60.0 in | Wt 136.8 lb

## 2015-06-08 DIAGNOSIS — E1121 Type 2 diabetes mellitus with diabetic nephropathy: Secondary | ICD-10-CM | POA: Diagnosis not present

## 2015-06-08 DIAGNOSIS — I10 Essential (primary) hypertension: Secondary | ICD-10-CM | POA: Diagnosis not present

## 2015-06-08 DIAGNOSIS — N183 Chronic kidney disease, stage 3 unspecified: Secondary | ICD-10-CM

## 2015-06-08 DIAGNOSIS — D649 Anemia, unspecified: Secondary | ICD-10-CM | POA: Diagnosis not present

## 2015-06-08 NOTE — Patient Instructions (Signed)
Need to try to eat.  Start with bland food - toast, banana, crackers, etc.

## 2015-06-08 NOTE — Progress Notes (Signed)
  Subjective:    [name] is a 81 y.o.white female who presents for follow-up of Type 2 diabetes mellitus.   A1C 7.9% (last A1C was 7.5%)  She says she has not had anything to eat in a week. She went to a church meal last Wednesday, and she vomited.  No vomiting since last Wednesday.  She has no appetite, only drinking juice and Ensure/Boost twice a day.  Has not attempt to eat since that time.  She doesn't want to eat.  No exercise.  Has not been monitoring blood glucose.  She cannot find her meter.  She says every meter she gets "doesn't work". She is living with her daughter; however, she states she does not "pay her no mind".  She does not have a good relationship with her.  Wears corrective lenses. Has pain in feet - especially heels.  Dry skin. Peripheral edema - right > left. Nocturia several times per night.  At least 3 times per night. Denies GI pain.  Doesn't have BM unless she medicates herself to have one.  EGFR: 82m/min  Very pale. Denies SOB or DOE Denies CP  No pain or tenderness in abdomen  Review of Systems A comprehensive review of systems was negative except for: Constitutional: positive for anorexia and fatigue   Eyes:   Wears glasses GU:  Nocturia HEENT:  Hoarse voice   Objective:    BP 120/70 mmHg  Pulse 60  Temp(Src) 97.3 F (36.3 C) (Oral)  Ht 5' (1.524 m)  Wt 136 lb 12.8 oz (62.052 kg)  BMI 26.72 kg/m2  SpO2 95%  General:  alert, appears stated age, fatigued and pale  Oropharynx: normal findings: lips normal without lesions and gums healthy   Eyes:  negative findings: lids and lashes normal and conjunctivae and sclerae normal   Ears:  normal TM's and external ear canals both ears        Lung: clear to auscultation bilaterally  Heart:  regular rate and rhythm     Extremities: edema right lower extremity  Skin: dry and pale  Abdomen: Normal bowel sounds  Neuro: mental status, speech normal, alert and oriented x3 and slow, shuffling  gait.   Lab Review GLUCOSE (mg/dL)  Date Value  05/22/2015 178*  03/09/2015 173*  12/04/2014 182*   GLUCOSE, BLD (mg/dL)  Date Value  04/06/2015 117*  04/05/2015 114*  04/04/2015 109*   CO2 (mmol/L)  Date Value  05/22/2015 22  04/06/2015 22  04/05/2015 23   BUN (mg/dL)  Date Value  05/22/2015 44*  04/06/2015 25*  04/05/2015 26*  04/04/2015 23*  03/09/2015 39*  12/04/2014 55*   CREATININE, SER (mg/dL)  Date Value  05/22/2015 1.52*  04/06/2015 1.25*  04/05/2015 1.27*   CBC and CMP today    Assessment:    Diabetes Mellitus type II, under fair control.   BP at goal <140/90 Last HGB <10   Plan:    1.  Rx changes: none 2.  Continue Metformin for now. 3.  Examined by Dr. RMariea Clonts  Labs (CBC, CMP) ordered.  Needs quick follow up with Dr. GNyoka Cowdento evaluate depressive symptoms. 4.  Counseled on importance of nutrition.  Encouraged her to try to start eating with light, bland foods - such as toast, bananas, crackers, etc. 5.  Anemia - checking CBC today. 6.  BP at goal <140/90

## 2015-06-09 ENCOUNTER — Telehealth: Payer: Self-pay

## 2015-06-09 LAB — COMPREHENSIVE METABOLIC PANEL
ALT: 9 IU/L (ref 0–32)
AST: 14 IU/L (ref 0–40)
Albumin/Globulin Ratio: 1.7 (ref 1.2–2.2)
Albumin: 3.4 g/dL — ABNORMAL LOW (ref 3.5–4.7)
Alkaline Phosphatase: 69 IU/L (ref 39–117)
BUN/Creatinine Ratio: 35 — ABNORMAL HIGH (ref 11–26)
BUN: 67 mg/dL — ABNORMAL HIGH (ref 8–27)
Bilirubin Total: 0.3 mg/dL (ref 0.0–1.2)
CO2: 20 mmol/L (ref 18–29)
Calcium: 8.9 mg/dL (ref 8.7–10.3)
Chloride: 103 mmol/L (ref 96–106)
Creatinine, Ser: 1.89 mg/dL — ABNORMAL HIGH (ref 0.57–1.00)
GFR calc Af Amer: 27 mL/min/{1.73_m2} — ABNORMAL LOW (ref >59)
GFR calc non Af Amer: 24 mL/min/{1.73_m2} — ABNORMAL LOW (ref >59)
Globulin, Total: 2 g/dL (ref 1.5–4.5)
Glucose: 187 mg/dL — ABNORMAL HIGH (ref 65–99)
Potassium: 5 mmol/L (ref 3.5–5.2)
Sodium: 141 mmol/L (ref 134–144)
Total Protein: 5.4 g/dL — ABNORMAL LOW (ref 6.0–8.5)

## 2015-06-09 LAB — CBC WITH DIFFERENTIAL/PLATELET
Basophils Absolute: 0 10*3/uL (ref 0.0–0.2)
Basos: 0 %
EOS (ABSOLUTE): 0.1 10*3/uL (ref 0.0–0.4)
Eos: 1 %
Hematocrit: 36.7 % (ref 34.0–46.6)
Hemoglobin: 11.8 g/dL (ref 11.1–15.9)
Immature Grans (Abs): 0 10*3/uL (ref 0.0–0.1)
Immature Granulocytes: 0 %
Lymphocytes Absolute: 2 10*3/uL (ref 0.7–3.1)
Lymphs: 36 %
MCH: 29.6 pg (ref 26.6–33.0)
MCHC: 32.2 g/dL (ref 31.5–35.7)
MCV: 92 fL (ref 79–97)
Monocytes Absolute: 0.4 10*3/uL (ref 0.1–0.9)
Monocytes: 7 %
Neutrophils Absolute: 3.2 10*3/uL (ref 1.4–7.0)
Neutrophils: 56 %
Platelets: 201 10*3/uL (ref 150–379)
RBC: 3.99 x10E6/uL (ref 3.77–5.28)
RDW: 14 % (ref 12.3–15.4)
WBC: 5.7 10*3/uL (ref 3.4–10.8)

## 2015-06-09 MED ORDER — LINAGLIPTIN 5 MG PO TABS: 5.0000 mg | ORAL_TABLET | Freq: Every day | ORAL | Status: DC
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
## 2015-06-10 ENCOUNTER — Encounter: Payer: Self-pay | Admitting: Internal Medicine

## 2015-06-10 ENCOUNTER — Ambulatory Visit (INDEPENDENT_AMBULATORY_CARE_PROVIDER_SITE_OTHER): Payer: Medicare Other | Admitting: Internal Medicine

## 2015-06-10 ENCOUNTER — Ambulatory Visit (INDEPENDENT_AMBULATORY_CARE_PROVIDER_SITE_OTHER): Payer: Medicare Other | Admitting: Ophthalmology

## 2015-06-10 VITALS — HR 57 | Temp 96.5°F | Resp 20 | Ht 60.0 in | Wt 136.6 lb

## 2015-06-10 DIAGNOSIS — H43813 Vitreous degeneration, bilateral: Secondary | ICD-10-CM | POA: Diagnosis not present

## 2015-06-10 DIAGNOSIS — E11311 Type 2 diabetes mellitus with unspecified diabetic retinopathy with macular edema: Secondary | ICD-10-CM | POA: Diagnosis not present

## 2015-06-10 DIAGNOSIS — S8011XA Contusion of right lower leg, initial encounter: Secondary | ICD-10-CM | POA: Diagnosis not present

## 2015-06-10 DIAGNOSIS — E113313 Type 2 diabetes mellitus with moderate nonproliferative diabetic retinopathy with macular edema, bilateral: Secondary | ICD-10-CM

## 2015-06-10 DIAGNOSIS — L03032 Cellulitis of left toe: Secondary | ICD-10-CM | POA: Diagnosis not present

## 2015-06-10 DIAGNOSIS — H35033 Hypertensive retinopathy, bilateral: Secondary | ICD-10-CM

## 2015-06-10 DIAGNOSIS — I10 Essential (primary) hypertension: Secondary | ICD-10-CM | POA: Diagnosis not present

## 2015-06-10 DIAGNOSIS — F0390 Unspecified dementia without behavioral disturbance: Secondary | ICD-10-CM

## 2015-06-10 MED ORDER — MEMANTINE HCL-DONEPEZIL HCL ER 28-10 MG PO CP24: 1.0000 | ORAL_CAPSULE | Freq: Every day | ORAL | Status: DC

## 2015-06-10 NOTE — Progress Notes (Signed)
Patient ID: [name], female   DOB: [birth year], 82 y.o.   MRN: [id number]   Location[facility]  Provider: Murray Hodgkins, M.D.  Code Status:  Goals of Care:  Full code Advanced Directives 06/10/2015  Does patient have an advance directive? No  Type of Advance Directive -  Does patient want to make changes to advanced directive? -  Copy of advanced directive(s) in chart? -  Would patient like information on creating an advanced directive? No - patient declined information     Chief Complaint  Patient presents with  . Acute Visit    Overall patient is not doing well, patient unable to eat and feels like health is going down hill. Per Dr.Reed patient needs MMSE and depression screening.   . Immunizations    Due Prevnar 13    HPI: Patient is a 82 y.o. female seen today for medical management of chronic diseases.    Last seen by me 05/26/2015. Problems include a sore on the left great toe, poor diabetic control and failure of the patient to use metformin. Blood pressure was normal. Depression and memory issues as well as general decline in health surfaced 06/08/2015 when she was here to be seen by Wynne Dust, pharmacist. Dr. Bufford Spikes saw her on that date and felt that she was depressed. She suggested a home health social worker to assess the home situation.  Patient also wants me to recheck her left great toe. It continues to be sore.   Past Medical History  Diagnosis Date  . Coronary artery disease     Remote PCI. Had BMS to RCA and DES stent to Diagonal in Jan. 2012   . Diastolic heart failure     EF 55 to 60% per cath in Jan 2012  . Hyperlipidemia   . Hypertension   . Carotid bruit   . Gout   . Obesity   . TIA (transient ischemic attack)   . Ischemic cardiomyopathy     with prior EF of 35%  . Anemia   . Proteinuria   . Carotid artery occlusion   . Stroke [facility]) 2013    ?  mini    . DVT (deep venous thrombosis) (HCC)   . NSTEMI (non-ST elevated myocardial  infarction) [facility]) Jan 2012    with PCI to RCA and DX per Dr. Excell Seltzer  . Pneumonia 2016  . Type II diabetes mellitus (HCC)     long standing  . Chronic kidney disease (CKD), stage III (moderate)     Past Surgical History  Procedure Laterality Date  . Appendectomy    . Tonsillectomy    . Ankle fusion Bilateral   . Coronary angioplasty with stent placement  04/05/2010    distal RCA  . Fracture surgery Right 2005    bilateral ankles   . Abdominal aortagram N/A 01/17/2014    Procedure: ABDOMINAL AORTAGRAM;  Surgeon: Sherren Kerns, MD;  Location: [facility] CATH LAB;  Service: Cardiovascular;  Laterality: N/A;  . Cataract extraction, bilateral Bilateral     Allergies  Allergen Reactions  . Percocet [Oxycodone-Acetaminophen] Other (See Comments)  . Sulfa Antibiotics Other (See Comments)    Tongue swells  . Metformin And Related Other (See Comments)    Abnormal Kidney Functions   . Tradjenta [Linagliptin]     Low back ache, resolved once medication stopped   . Penicillins Hives    Tolerates Ceftriaxone      Medication List  This list is accurate as of: 06/10/15  1:49 PM.  Always use your most recent med list.               aspirin 81 MG tablet  Take 81 mg by mouth daily. For heart health     Capsaicin-Menthol-Methyl Sal 0.025-1-12 % Crea  Commonly known as:  capsaicin-methyl sal-menthol  Apply 1 application topically 2 (two) times daily.     carvedilol 3.125 MG tablet  Commonly known as:  COREG  One twice daily to control BP and strengthen the heart     cloNIDine 0.1 MG tablet  Commonly known as:  CATAPRES  Take 1 tablet (0.1 mg total) by mouth daily. For high blood pressure     ezetimibe-simvastatin 10-20 MG tablet  Commonly known as:  VYTORIN  1/2 tablet nightly to control cholesterol     furosemide 20 MG tablet  Commonly known as:  LASIX  Take 1 tablet (20 mg total) by mouth daily. For swelling     glucose blood test strip  E11.59 Check blood sugar daily  as directed     linagliptin 5 MG Tabs tablet  Commonly known as:  TRADJENTA  Take 1 tablet (5 mg total) by mouth daily.     losartan 50 MG tablet  Commonly known as:  COZAAR  Take 1 tablet (50 mg total) by mouth daily. For high blood pressure     LOTEMAX 0.5 % Gel  Generic drug:  Loteprednol Etabonate  One drop in the left eye twice daly to control glaucome     nitroGLYCERIN 0.4 MG SL tablet  Commonly known as:  NITROSTAT  Dissolve one tablet under tongue as needed for chest pain. May repeat as needed every 5 minutes up to 3 doses. Seek medical attention if pain is not relieved after 3 doses     polyethylene glycol powder powder  Commonly known as:  GLYCOLAX/MIRALAX  DISSOLVE 17 GRAMS (1 CAPFUL) INTO 8 OUNCES OF LIQUID AND DRINK EVERY DAY AS NEEDED FOR BOWELS     timolol 0.5 % ophthalmic solution  Commonly known as:  BETIMOL  One drop in the left eye daily to help control glaucoma        Review of Systems:  Review of Systems  Constitutional: Negative for fever, chills, diaphoresis, activity change, appetite change, fatigue and unexpected weight change.  HENT: Negative for congestion, ear discharge, ear pain, hearing loss, postnasal drip, rhinorrhea, sore throat, tinnitus, trouble swallowing and voice change.   Eyes: Positive for visual disturbance (corrective lenses). Negative for pain, redness and itching.  Respiratory: Negative for cough, choking, shortness of breath and wheezing.   Cardiovascular: Positive for leg swelling. Negative for chest pain and palpitations.  Gastrointestinal: Positive for abdominal pain and constipation. Negative for nausea, diarrhea, abdominal distention, anal bleeding and rectal pain.  Endocrine: Negative for cold intolerance, heat intolerance, polydipsia, polyphagia and polyuria.  Genitourinary: Negative for dysuria, urgency, frequency, hematuria, flank pain, vaginal discharge, difficulty urinating and pelvic pain.       Mass at rectum following  straining CKD 3  Musculoskeletal: Positive for gait problem. Negative for myalgias, back pain, arthralgias, neck pain and neck stiffness.  Skin: Negative for color change, pallor, rash and wound.       Pain in the left great toe  Allergic/Immunologic: Negative.   Neurological: Negative for dizziness, tremors, seizures, syncope, weakness, light-headedness, numbness and headaches.  Hematological: Negative for adenopathy. Does not bruise/bleed easily.       Chronic  Anemia  Psychiatric/Behavioral: Negative for suicidal ideas, hallucinations, behavioral problems, confusion, sleep disturbance, dysphoric mood and agitation. The patient is not nervous/anxious and is not hyperactive.     Health Maintenance  Topic Date Due  . FOOT EXAM  06/29/1939  . OPHTHALMOLOGY EXAM  06/29/1939  . TETANUS/TDAP  06/28/1948  . ZOSTAVAX  06/28/1989  . PNA vac Low Risk Adult (2 of 2 - PCV13) 06/29/1995  . INFLUENZA VACCINE  10/20/2015  . HEMOGLOBIN A1C  11/22/2015  . DEXA SCAN  Completed    Physical Exam: Filed Vitals:   06/10/15 1245  Pulse: 57  Temp: 96.5 F (35.8 C)  TempSrc: Axillary  Resp: 20  Height: 5' (1.524 m)  Weight: 136 lb 9.6 oz (61.961 kg)  SpO2: 96%   Body mass index is 26.68 kg/(m^2). Physical Exam  Constitutional: She is oriented to person, place, and time. She appears well-developed and well-nourished. No distress.  Frail elderly female, NAD  HENT:  Head: Normocephalic and atraumatic.  Right Ear: External ear normal.  Left Ear: External ear normal.  Nose: Nose normal.  Mouth/Throat: Oropharynx is clear and moist. No oropharyngeal exudate.  Eyes: Conjunctivae and EOM are normal. Pupils are equal, round, and reactive to light. No scleral icterus.  Neck: Normal range of motion. Neck supple. No JVD present. No tracheal deviation present. No thyromegaly present.  Cardiovascular: Normal rate, regular rhythm, normal heart sounds and intact distal pulses.  Exam reveals no gallop and no  friction rub.   No murmur heard. Pulmonary/Chest: Effort normal and breath sounds normal. No respiratory distress. She has no wheezes. She has no rales. She exhibits no tenderness.  Abdominal: Soft. Bowel sounds are normal. She exhibits no distension and no mass. There is no tenderness.  Genitourinary:  Loose anal sphincter. No palpable rectal mass. With straining, there is some mucosal eversion.  Musculoskeletal: Normal range of motion. She exhibits edema. She exhibits no tenderness.  Lymphadenopathy:    She has no cervical adenopathy.  Neurological: She is alert and oriented to person, place, and time. No cranial nerve deficit. Coordination normal.  Slow unstable gait 06/10/15 MMSE 20/30. Failed clock drawing.  Skin: Skin is warm and dry. No rash noted. She is not diaphoretic. No erythema. No pallor.  Ingrown nail of the left great toe.  Psychiatric: She has a normal mood and affect. Her behavior is normal. Judgment and thought content normal.    Labs reviewed: Basic Metabolic Panel:  Recent Labs  16/10/96 0546 05/22/15 0951 06/08/15 1053  NA 144 145* 141  K 4.2 5.1 5.0  CL 114* 107* 103  CO2 GLUCOSE 117* 178* 187*  BUN 25* 44* 67*  CREATININE 1.25* 1.52* 1.89*  CALCIUM 8.4* 9.0 8.9   Liver Function Tests:  Recent Labs  04/02/15 0913 05/22/15 0951 06/08/15 1053  AST ALT 13* 7 9  ALKPHOS 75 60 69  BILITOT 0.7 0.3 0.3  PROT 6.8 5.9* 5.4*  ALBUMIN 3.4* 3.9 3.4*   No results for input(s): LIPASE, AMYLASE in the last 8760 hours. No results for input(s): AMMONIA in the last 8760 hours. CBC:  Recent Labs  04/02/15 0913 04/03/15 0310 04/04/15 0505 04/06/15 0546 06/08/15 1053  WBC 4.3 4.2 3.7* 3.8* 5.7  NEUTROABS 2.7 2.3  --   --  3.2  HGB 12.2 11.3* 9.7* 9.8*  --   HCT 38.1 35.1* 30.8* 30.9* 36.7  MCV 95.0 95.9 97.5 94.2 92  PLT 152 135* 118* 136*  201   Lipid Panel:  Recent Labs  10/21/14 1150 03/09/15 1129  CHOL 135 143  HDL 43.80  35*  LDLCALC 62 74  TRIG 148.0 170*  CHOLHDL 3 4.1   Lab Results  Component Value Date   HGBA1C 7.9* 05/22/2015   Assessment/Plan  1. Cellulitis of toe of left foot Sharply debrided nail that remains ingrown. Patient states the toe felt much better following the debridement.  2. Contusion of right leg, initial encounter hematoma of the right mid shin area  3. Dementia, without behavioral disturbance Patient unaware of her memory loss.  Started pack for Namzeric and prescription for Namzeric 28/10 one daily were given to patient. Will follow up in 4 weeks.

## 2015-06-11 ENCOUNTER — Telehealth: Payer: Self-pay

## 2015-06-11 NOTE — Telephone Encounter (Signed)
The medications listed have all been discontinued.

## 2015-06-11 NOTE — Telephone Encounter (Signed)
[facility] sent a fax to verify if these medication have been discontinued  for patient  ( FERROUS SULFATE 325 MG TAB, WELCHOL 625 MG TAB, OMEGA-3-ACID 1 G SOFTGEL CAP, CARVEDILOL 3.125 MG TAB. Please Advise

## 2015-06-12 ENCOUNTER — Encounter (HOSPITAL_COMMUNITY): Payer: Self-pay | Admitting: Emergency Medicine

## 2015-06-12 ENCOUNTER — Observation Stay (HOSPITAL_COMMUNITY)
Admission: EM | Admit: 2015-06-12 | Discharge: 2015-06-15 | Disposition: A | Discharge status: Home / Self Care | Payer: Medicare Other | Attending: Family Medicine | Admitting: Family Medicine

## 2015-06-12 ENCOUNTER — Emergency Department (HOSPITAL_COMMUNITY): Payer: Medicare Other

## 2015-06-12 DIAGNOSIS — H409 Unspecified glaucoma: Secondary | ICD-10-CM | POA: Diagnosis not present

## 2015-06-12 DIAGNOSIS — Z87891 Personal history of nicotine dependence: Secondary | ICD-10-CM | POA: Diagnosis not present

## 2015-06-12 DIAGNOSIS — Z7984 Long term (current) use of oral hypoglycemic drugs: Secondary | ICD-10-CM | POA: Diagnosis not present

## 2015-06-12 DIAGNOSIS — R112 Nausea with vomiting, unspecified: Secondary | ICD-10-CM | POA: Diagnosis not present

## 2015-06-12 DIAGNOSIS — I491 Atrial premature depolarization: Secondary | ICD-10-CM | POA: Diagnosis not present

## 2015-06-12 DIAGNOSIS — R42 Dizziness and giddiness: Secondary | ICD-10-CM | POA: Diagnosis not present

## 2015-06-12 DIAGNOSIS — I255 Ischemic cardiomyopathy: Secondary | ICD-10-CM | POA: Insufficient documentation

## 2015-06-12 DIAGNOSIS — N183 Chronic kidney disease, stage 3 (moderate): Secondary | ICD-10-CM | POA: Insufficient documentation

## 2015-06-12 DIAGNOSIS — Z880 Allergy status to penicillin: Secondary | ICD-10-CM | POA: Diagnosis not present

## 2015-06-12 DIAGNOSIS — I252 Old myocardial infarction: Secondary | ICD-10-CM | POA: Insufficient documentation

## 2015-06-12 DIAGNOSIS — F0390 Unspecified dementia without behavioral disturbance: Secondary | ICD-10-CM | POA: Diagnosis not present

## 2015-06-12 DIAGNOSIS — I447 Left bundle-branch block, unspecified: Secondary | ICD-10-CM | POA: Insufficient documentation

## 2015-06-12 DIAGNOSIS — I2510 Atherosclerotic heart disease of native coronary artery without angina pectoris: Secondary | ICD-10-CM | POA: Insufficient documentation

## 2015-06-12 DIAGNOSIS — Z882 Allergy status to sulfonamides status: Secondary | ICD-10-CM | POA: Diagnosis not present

## 2015-06-12 DIAGNOSIS — E860 Dehydration: Secondary | ICD-10-CM | POA: Diagnosis not present

## 2015-06-12 DIAGNOSIS — Z888 Allergy status to other drugs, medicaments and biological substances status: Secondary | ICD-10-CM | POA: Insufficient documentation

## 2015-06-12 DIAGNOSIS — I130 Hypertensive heart and chronic kidney disease with heart failure and stage 1 through stage 4 chronic kidney disease, or unspecified chronic kidney disease: Secondary | ICD-10-CM | POA: Diagnosis not present

## 2015-06-12 DIAGNOSIS — Z79899 Other long term (current) drug therapy: Secondary | ICD-10-CM | POA: Diagnosis not present

## 2015-06-12 DIAGNOSIS — M109 Gout, unspecified: Secondary | ICD-10-CM | POA: Insufficient documentation

## 2015-06-12 DIAGNOSIS — N19 Unspecified kidney failure: Secondary | ICD-10-CM

## 2015-06-12 DIAGNOSIS — Z8673 Personal history of transient ischemic attack (TIA), and cerebral infarction without residual deficits: Secondary | ICD-10-CM | POA: Diagnosis not present

## 2015-06-12 DIAGNOSIS — T50905A Adverse effect of unspecified drugs, medicaments and biological substances, initial encounter: Secondary | ICD-10-CM | POA: Diagnosis present

## 2015-06-12 DIAGNOSIS — E1121 Type 2 diabetes mellitus with diabetic nephropathy: Secondary | ICD-10-CM | POA: Insufficient documentation

## 2015-06-12 DIAGNOSIS — T68XXXA Hypothermia, initial encounter: Secondary | ICD-10-CM | POA: Diagnosis not present

## 2015-06-12 DIAGNOSIS — Z955 Presence of coronary angioplasty implant and graft: Secondary | ICD-10-CM | POA: Insufficient documentation

## 2015-06-12 DIAGNOSIS — E1122 Type 2 diabetes mellitus with diabetic chronic kidney disease: Secondary | ICD-10-CM | POA: Diagnosis not present

## 2015-06-12 DIAGNOSIS — R339 Retention of urine, unspecified: Secondary | ICD-10-CM | POA: Diagnosis not present

## 2015-06-12 DIAGNOSIS — E785 Hyperlipidemia, unspecified: Secondary | ICD-10-CM | POA: Insufficient documentation

## 2015-06-12 DIAGNOSIS — Z66 Do not resuscitate: Secondary | ICD-10-CM | POA: Diagnosis not present

## 2015-06-12 DIAGNOSIS — Z7982 Long term (current) use of aspirin: Secondary | ICD-10-CM | POA: Insufficient documentation

## 2015-06-12 DIAGNOSIS — Z86718 Personal history of other venous thrombosis and embolism: Secondary | ICD-10-CM | POA: Insufficient documentation

## 2015-06-12 DIAGNOSIS — Z885 Allergy status to narcotic agent status: Secondary | ICD-10-CM | POA: Diagnosis not present

## 2015-06-12 DIAGNOSIS — I5032 Chronic diastolic (congestive) heart failure: Secondary | ICD-10-CM | POA: Diagnosis not present

## 2015-06-12 LAB — URINALYSIS, ROUTINE W REFLEX MICROSCOPIC
BILIRUBIN URINE: NEGATIVE
Glucose, UA: 250 mg/dL — AB
Hgb urine dipstick: NEGATIVE
Ketones, ur: NEGATIVE mg/dL
Leukocytes, UA: NEGATIVE
NITRITE: NEGATIVE
PROTEIN: 30 mg/dL — AB
SPECIFIC GRAVITY, URINE: 1.013 (ref 1.005–1.030)
pH: 6.5 (ref 5.0–8.0)

## 2015-06-12 LAB — COMPREHENSIVE METABOLIC PANEL
ALT: 18 U/L (ref 14–54)
ANION GAP: 12 (ref 5–15)
AST: 24 U/L (ref 15–41)
Albumin: 3.3 g/dL — ABNORMAL LOW (ref 3.5–5.0)
Alkaline Phosphatase: 65 U/L (ref 38–126)
BUN: 76 mg/dL — AB (ref 6–20)
CHLORIDE: 107 mmol/L (ref 101–111)
CO2: 19 mmol/L — ABNORMAL LOW (ref 22–32)
Calcium: 8.7 mg/dL — ABNORMAL LOW (ref 8.9–10.3)
Creatinine, Ser: 1.56 mg/dL — ABNORMAL HIGH (ref 0.44–1.00)
GFR calc Af Amer: 34 mL/min — ABNORMAL LOW (ref >60)
GFR, EST NON AFRICAN AMERICAN: 29 mL/min — AB (ref >60)
Glucose, Bld: 247 mg/dL — ABNORMAL HIGH (ref 65–99)
POTASSIUM: 4.4 mmol/L (ref 3.5–5.1)
Sodium: 138 mmol/L (ref 135–145)
TOTAL PROTEIN: 6 g/dL — AB (ref 6.5–8.1)
Total Bilirubin: 0.5 mg/dL (ref 0.3–1.2)

## 2015-06-12 LAB — TSH: TSH: 2.149 u[IU]/mL (ref 0.350–4.500)

## 2015-06-12 LAB — CBC
HEMATOCRIT: 34.8 % — AB (ref 36.0–46.0)
Hemoglobin: 11.7 g/dL — ABNORMAL LOW (ref 12.0–15.0)
MCH: 30.5 pg (ref 26.0–34.0)
MCHC: 33.6 g/dL (ref 30.0–36.0)
MCV: 90.9 fL (ref 78.0–100.0)
PLATELETS: 133 10*3/uL — AB (ref 150–400)
RBC: 3.83 MIL/uL — ABNORMAL LOW (ref 3.87–5.11)
RDW: 13.3 % (ref 11.5–15.5)
WBC: 4.4 10*3/uL (ref 4.0–10.5)

## 2015-06-12 LAB — I-STAT TROPONIN, ED: TROPONIN I, POC: 0.02 ng/mL (ref 0.00–0.08)

## 2015-06-12 LAB — URINE MICROSCOPIC-ADD ON: RBC / HPF: NONE SEEN RBC/hpf (ref 0–5)

## 2015-06-12 LAB — CBG MONITORING, ED: GLUCOSE-CAPILLARY: 213 mg/dL — AB (ref 65–99)

## 2015-06-12 LAB — I-STAT CG4 LACTIC ACID, ED
LACTIC ACID, VENOUS: 0.98 mmol/L (ref 0.5–2.0)
Lactic Acid, Venous: 1.29 mmol/L (ref 0.5–2.0)

## 2015-06-12 LAB — LIPASE, BLOOD: LIPASE: 44 U/L (ref 11–51)

## 2015-06-12 LAB — GLUCOSE, CAPILLARY: Glucose-Capillary: 202 mg/dL — ABNORMAL HIGH (ref 65–99)

## 2015-06-12 LAB — T4, FREE: FREE T4: 1.17 ng/dL — AB (ref 0.61–1.12)

## 2015-06-12 MED ORDER — TIMOLOL HEMIHYDRATE 0.5 % OP SOLN
1.0000 [drp] | Freq: Every day | OPHTHALMIC | Status: DC
Start: 2015-06-12 — End: 2015-06-12

## 2015-06-12 MED ORDER — ONDANSETRON HCL 4 MG/2ML IJ SOLN
4.0000 mg | Freq: Four times a day (QID) | INTRAMUSCULAR | Status: AC
  Administered 2015-06-13 (×2): 4 mg via INTRAVENOUS
  Filled 2015-06-12 (×2): qty 2

## 2015-06-12 MED ORDER — ONDANSETRON HCL 4 MG/2ML IJ SOLN
4.0000 mg | Freq: Once | INTRAMUSCULAR | Status: AC
  Administered 2015-06-12: 4 mg via INTRAVENOUS
  Filled 2015-06-12: qty 2

## 2015-06-12 MED ORDER — HYDRALAZINE HCL 20 MG/ML IJ SOLN: 10.0000 mg | Freq: Four times a day (QID) | INTRAMUSCULAR | Status: DC | PRN

## 2015-06-12 MED ORDER — ONDANSETRON HCL 4 MG PO TABS: 4.0000 mg | ORAL_TABLET | Freq: Four times a day (QID) | ORAL | Status: DC | PRN

## 2015-06-12 MED ORDER — ONDANSETRON HCL 4 MG/2ML IJ SOLN: 4.0000 mg | Freq: Four times a day (QID) | INTRAMUSCULAR | Status: DC | PRN

## 2015-06-12 MED ORDER — SODIUM CHLORIDE 0.9 % IV SOLN
INTRAVENOUS | Status: DC
  Administered 2015-06-12: 18:00:00 via INTRAVENOUS

## 2015-06-12 MED ORDER — ACETAMINOPHEN 325 MG PO TABS
650.0000 mg | ORAL_TABLET | Freq: Once | ORAL | Status: AC
  Administered 2015-06-13: 650 mg via ORAL
  Filled 2015-06-12: qty 2

## 2015-06-12 MED ORDER — ENOXAPARIN SODIUM 30 MG/0.3ML ~~LOC~~ SOLN
30.0000 mg | SUBCUTANEOUS | Status: DC
  Administered 2015-06-12 – 2015-06-13 (×2): 30 mg via SUBCUTANEOUS
  Filled 2015-06-12 (×2): qty 0.3

## 2015-06-12 MED ORDER — TIMOLOL MALEATE 0.5 % OP SOLN
1.0000 [drp] | Freq: Every day | OPHTHALMIC | Status: DC
  Administered 2015-06-12 – 2015-06-15 (×4): 1 [drp] via OPHTHALMIC
  Filled 2015-06-12: qty 5

## 2015-06-12 MED ORDER — NITROGLYCERIN 0.4 MG SL SUBL: 0.4000 mg | SUBLINGUAL_TABLET | SUBLINGUAL | Status: DC | PRN

## 2015-06-12 MED ORDER — INSULIN ASPART 100 UNIT/ML ~~LOC~~ SOLN
0.0000 [IU] | SUBCUTANEOUS | Status: DC
  Administered 2015-06-12: 3 [IU] via SUBCUTANEOUS
  Administered 2015-06-13: 1 [IU] via SUBCUTANEOUS
  Administered 2015-06-13: 2 [IU] via SUBCUTANEOUS
  Administered 2015-06-13: 1 [IU] via SUBCUTANEOUS
  Administered 2015-06-14: 2 [IU] via SUBCUTANEOUS
  Administered 2015-06-15: 1 [IU] via SUBCUTANEOUS
  Administered 2015-06-15: 3 [IU] via SUBCUTANEOUS

## 2015-06-12 NOTE — ED Notes (Signed)
Pt complaining of urinary retention. States "I have to go so bad but cannot go." Pt bearing down in attempt. Bladder scanner revealed >650 cc urine. Pt on bedpan. Was able to urinate 300 but bladder scanner after urination revealed 300 cc remaining. Pt states "I still feel like I have to go so bad."

## 2015-06-12 NOTE — H&P (Signed)
[facility] History and Physical  CIARAH PEACE ZOX:[id number] DOB: Dec 10, 1927 DOA: 06/12/2015  Referring physician: Emergency Department PCP: Kimber Relic, MD   CHIEF COMPLAINT:   Nausea, vomiting, dizziness.                HPI: [name] is a 82 y.o. female presenting to ED with nausea, vomiting, and dizziness.  Patient states her symptoms started acutely this morning. Daughter at bedside, states patient had some of these symptoms last week but had improved.  Patient endorses loose stool this morning . No sick contacts . No recent medication changes per daughter. She was hospitalized in January with influenza A. Treated empirically with antibiotics while awaiting flu panel, then changed to Tamiflu.       ED COURSE:           Labs:    Troponin and lactic acid both normal Bicarbonate 19, BUN 76, creatinine 1.56, glucose 247 WBC 4.4, hgb 11.7, platelets 133 hgb A1c 7.9  05/22/15.   Urinalysis:    Clear, rare bacteria, negative leukocytes             CXR:    no acute findings         EKG:    Sinus rhythm Atrial premature complex Left bundle branch block                  Medications  ondansetron (ZOFRAN) injection 4 mg (4 mg Intravenous Given 06/12/15 1405)   Review of Systems  Constitutional: Positive for chills.  HENT: Negative.   Eyes: Negative.   Respiratory: Negative.   Cardiovascular: Negative.   Gastrointestinal: Positive for nausea, vomiting and diarrhea.  Genitourinary: Negative.   Musculoskeletal: Negative.   Skin: Negative.   Neurological: Positive for dizziness.  Endo/Heme/Allergies: Negative.   Psychiatric/Behavioral: Negative.     Past Medical History  Diagnosis Date  . Coronary artery disease     Remote PCI. Had BMS to RCA and DES stent to Diagonal in Jan. 2012   . Diastolic heart failure     EF 55 to 60% per cath in Jan 2012  . Hyperlipidemia   . Hypertension   . Carotid bruit   . Gout   . Obesity   . TIA (transient ischemic  attack)   . Ischemic cardiomyopathy     with prior EF of 35%  . Anemia   . Proteinuria   . Carotid artery occlusion   . Stroke [facility]) 2013    ?  mini    . DVT (deep venous thrombosis) (HCC)   . NSTEMI (non-ST elevated myocardial infarction) [facility]) Jan 2012    with PCI to RCA and DX per Dr. Excell Seltzer  . Pneumonia 2016  . Type II diabetes mellitus (HCC)     long standing  . Chronic kidney disease (CKD), stage III (moderate)    Past Surgical History  Procedure Laterality Date  . Appendectomy    . Tonsillectomy    . Ankle fusion Bilateral   . Coronary angioplasty with stent placement  04/05/2010    distal RCA  . Fracture surgery Right 2005    bilateral ankles   . Abdominal aortagram N/A 01/17/2014    Procedure: ABDOMINAL AORTAGRAM;  Surgeon: Sherren Kerns, MD;  Location: [facility] CATH LAB;  Service: Cardiovascular;  Laterality: N/A;  . Cataract extraction, bilateral Bilateral     SOCIAL HISTORY:  reports that she has quit smoking. She has never used smokeless tobacco. She reports that she  does not drink alcohol or use illicit drugs. Lives:   At home with daughter   Assistive devices:   walker needed for ambulation.   Allergies  Allergen Reactions  . Percocet [Oxycodone-Acetaminophen] Other (See Comments)  . Sulfa Antibiotics Other (See Comments)    Tongue swells  . Metformin And Related Other (See Comments)    Abnormal Kidney Functions   . Tradjenta [Linagliptin]     Low back ache, resolved once medication stopped   . Penicillins Hives    Tolerates Ceftriaxone    Family History  Problem Relation Age of Onset  . Heart disease Neg Hx     both parents were murdered    Prior to Admission medications   Medication Sig Start Date End Date Taking? Authorizing Provider  aspirin 81 MG tablet Take 81 mg by mouth daily. For heart health    Historical Provider, MD  Capsaicin-Menthol-Methyl Sal (CAPSAICIN-METHYL SAL-MENTHOL) 0.025-1-12 % CREA Apply 1 application topically 2 (two) times  daily. 03/13/15   Tharon Aquas, PA  carvedilol (COREG) 3.125 MG tablet One twice daily to control BP and strengthen the heart 05/26/15   Kimber Relic, MD  cloNIDine (CATAPRES) 0.1 MG tablet Take 1 tablet (0.1 mg total) by mouth daily. For high blood pressure 09/04/14   Sharon Seller, NP  ezetimibe-simvastatin (VYTORIN) 10-20 MG tablet 1/2 tablet nightly to control cholesterol 05/26/15   Kimber Relic, MD  furosemide (LASIX) 20 MG tablet Take 1 tablet (20 mg total) by mouth daily. For swelling 09/04/14   Sharon Seller, NP  glucose blood test strip E11.59 Check blood sugar daily as directed 08/07/14   Sharon Seller, NP  linagliptin (TRADJENTA) 5 MG TABS tablet Take 1 tablet (5 mg total) by mouth daily. 06/09/15   Edison Pace, RPH-CPP  losartan (COZAAR) 50 MG tablet Take 1 tablet (50 mg total) by mouth daily. For high blood pressure 09/04/14   Sharon Seller, NP  LOTEMAX 0.5 % GEL One drop in the left eye twice daly to control glaucome 05/26/15   Kimber Relic, MD  Memantine HCl-Donepezil HCl [facility]) 28-10 MG CP24 Take 1 capsule by mouth daily. each evening to help preserve memory 06/10/15   Kimber Relic, MD  nitroGLYCERIN (NITROSTAT) 0.4 MG SL tablet Dissolve one tablet under tongue as needed for chest pain. May repeat as needed every 5 minutes up to 3 doses. Seek medical attention if pain is not relieved after 3 doses 10/27/14   Sharon Seller, NP  polyethylene glycol powder (GLYCOLAX/MIRALAX) powder DISSOLVE 17 GRAMS (1 CAPFUL) INTO 8 OUNCES OF LIQUID AND DRINK EVERY DAY AS NEEDED FOR BOWELS 05/18/15   Kimber Relic, MD  timolol (BETIMOL) 0.5 % ophthalmic solution One drop in the left eye daily to help control glaucoma 05/26/15   Kimber Relic, MD   PHYSICAL EXAM: Filed Vitals:   06/12/15 1315 06/12/15 1345 06/12/15 1400 06/12/15 1430  BP: 193/67 183/57 176/74 164/50  Pulse: 56 56 55 56  Temp:      TempSrc:      Resp: 17 13  12   SpO2: 100% 99% 97% 100%    Wt Readings from  Last 3 Encounters:  06/10/15 61.961 kg (136 lb 9.6 oz)  06/08/15 62.052 kg (136 lb 12.8 oz)  05/26/15 65.953 kg (145 lb 6.4 oz)    General:  Pleasant white female. Calm, keeps stating how bad she feels.  Eyes: PER, normal lids, irises & conjunctiva ENT: grossly normal  hearing, lips & tongue Neck: no LAD, no masses Cardiovascular: RRR, no murmurs. No LE edema.  Respiratory: Respirations even and unlabored. Normal respiratory effort. Lungs CTA bilaterally, no wheezes / rales .   Abdomen: soft, non-distended, non-tender, active bowel sounds. No obvious masses.  Skin: no rash seen on limited exam Musculoskeletal: grossly normal tone BUE/BLE Psychiatric: grossly normal mood and affect, speech fluent and appropriate Neurologic: grossly non-focal.         LABS ON ADMISSION:    Basic Metabolic Panel:  Recent Labs Lab 06/08/15 1053 06/12/15 1355  NA 141 138  K 5.0 4.4  CL 103 107  CO2 20 19*  GLUCOSE 187* 247*  BUN 67* 76*  CREATININE 1.89* 1.56*  CALCIUM 8.9 8.7*   Liver Function Tests:  Recent Labs Lab 06/08/15 1053 06/12/15 1355  AST 14 24  ALT 9 18  ALKPHOS 69 65  BILITOT 0.3 0.5  PROT 5.4* 6.0*  ALBUMIN 3.4* 3.3*    CBC:  Recent Labs Lab 06/08/15 1053 06/12/15 1355  WBC 5.7 4.4  NEUTROABS 3.2  --   HGB  --  11.7*  HCT 36.7 34.8*  MCV 92 90.9  PLT 201 133*    BNP (last 3 results)  Recent Labs  04/02/15 0913  BNP 125.4*    CREATININE: 1.56 mg/dL ABNORMAL (16/12/9601/24/17 [id number]) Estimated creatinine clearance - 21.7 mL/min  Radiological Exams on Admission: Dg Chest 2 View  06/12/2015  CLINICAL DATA:  Headache and nausea up beginning this morning. Hypertension and diabetes. EXAM: CHEST  2 VIEW COMPARISON:  04/03/2015 and previous FINDINGS: The heart is mildly enlarged with left ventricular prominence. There is atherosclerosis of the aorta. Coronary stents are in place. There is linear scarring in the lingula. The lungs are otherwise well aerated. No  effusions. Pulmonary vascularity is normal. No acute bone finding. IMPRESSION: Left ventricular prominence. Atherosclerosis. Coronary stents. No active pulmonary disease. Electronically Signed   By: Paulina FusiMark  Shogry M.D.   On: 06/12/2015 15:17   Ct Head Wo Contrast  06/12/2015  CLINICAL DATA:  Nausea and dizziness for 4 days EXAM: CT HEAD WITHOUT CONTRAST TECHNIQUE: Contiguous axial images were obtained from the base of the skull through the vertex without intravenous contrast. COMPARISON:  Head CT September 05, 2009 and brain MRI September 05, 2009 FINDINGS: There is mild diffuse atrophy. There is no intracranial mass, hemorrhage, extra-axial fluid collection, or midline shift. There is slight small vessel disease in the centra semiovale bilaterally. Elsewhere, gray-white compartments appear normal. No acute infarct is evident. Bony calvarium appears intact. The mastoid air cells are clear. There is mild debris in each external auditory canal. IMPRESSION: Atrophy with slight periventricular small vessel disease. No intracranial mass, hemorrhage, or evidence of acute infarct. Probable cerumen in each external auditory canal. Electronically Signed   By: Bretta BangWilliam  Woodruff III M.D.   On: 06/12/2015 15:25     ASSESSMENT / PLAN   Active Problems:   Coronary artery disease   Diabetes mellitus with nephropathy (HCC)   Nausea & vomiting   Dizziness   Urinary retention   Nausea, vomiting. Endorses loose stool as well. Stool sample at bedside looks soft. Abdominal exam doesn't suggest obstructive process. Gastroenteritis? She complains of dizziness which could be contributing to the nausea and vomiting as well. Head CTscan negative.  Of note, after reviewing PCP's office note I found that patient was given sample of Namzaric 2 days ago for dementia. Daughter in room earlier and stated patient had not recently started  any new medications.. Daughter no longer in room to verify if patient started the medication but nausea,  vomiting and urinary retention are most common side effects of Namzaric.  -Admit to observation - Medical bed -orthostatic vitals now -Gentle IV hydration ( cardiac function unknown but she is on Coreg) -hold Namzaric -GI pathogen panel -scheduled Zofran Q6 hours x 4 doses -sips of clears as tolerated  Dizziness, ? Secondary to dehydration. Head CTscan negative -Gentle IV hydration through tonight and see how she feels in am.  -consider trial of meclizine is no improvement  CKD stage 3- 4,  based on previous labs.  BUN to Cr ratio is elevated, ? Dehydration.  -IV hydration -am bmet -avoid nephrotoxic medications  Urinary retention. Etiology? Doesn't take anti-cholinergics nor narcotics.  -foley already placed, will continue -if fails voiding trial after removal of foley she may need imaging to evaluate for obstructive  process.   Hypothermia, rectal temp 95.2. Normal WBC and lactic acid. Doubt sepsis at this point.   Hypertension. SBP initially high but trending down over last few hours 190 >>>>164 /50.  -hold lasix, appears dehydrated and not tolerating PO.  - hold home cozaar, coreg and clonidine until taking PO.  -prn hydralazine.   Diabetes, type 2. Uncontrolled.  -Hold home tradjenta -CBGs Q4 with SSI. Change to [facility] and HS when taking PO  Hyperlipidemia.  -hold lipid medications until tolerating PO  -?dementia.  PCP gave her a sample pack of Namzaric a couple of days ago. Not sure if she started it as daughter told me patient had not had any recent medication changes and now daughter not in room to verify. Holding the medication as current symptoms (urinary retention / nausea and vomiting) are known side effects of Namzaricy.    CONSULTANTS:  none    Code Status: DNR DVT Prophylaxis: Lovenox-  Reduced dose Family Communication:  Patient alert, oriented and understands plan of care.  Disposition Plan: Discharge to home in 24-48 hours   Time spent: 60 minutes Willette Cluster  NP [facility] Pager [phone]

## 2015-06-12 NOTE — ED Notes (Signed)
Pt to ER via [facility] with complaint of nausea, vomiting, and dizziness x3-4 days. Was seen by PCP on Wednesday but did not receive any new medications or recommendations. Told to follow up in a week. Pt appears pale and uncomfortable on arrival. Also reports diarrhea at this time. BP 216/62, HR 64, CBG 221.

## 2015-06-12 NOTE — ED Provider Notes (Signed)
CSN: [id number]     Arrival date & time 06/12/15  1243 History   First MD Initiated Contact with Patient 06/12/15 1258     Chief Complaint  Patient presents with  . Nausea  . Emesis  . Dizziness   HPI  [name] is a 82 y.o. female PMH significant for CAD, DM, diastolic HF (EF 55-60% Jan 2012), HTN, NSTEMI, CKD III, hyperlipidemia, TIA, DVT presenting with 4 days of nausea, vomiting (NBNB), dizziness (nonvertiginous), urinary retention. She denies fevers, chills, shortness of breath, pain.  Past Medical History  Diagnosis Date  . Coronary artery disease     Remote PCI. Had BMS to RCA and DES stent to Diagonal in Jan. 2012   . Diastolic heart failure     EF 55 to 60% per cath in Jan 2012  . Hyperlipidemia   . Hypertension   . Carotid bruit   . Gout   . Obesity   . TIA (transient ischemic attack)   . Ischemic cardiomyopathy     with prior EF of 35%  . Anemia   . Proteinuria   . Carotid artery occlusion   . Stroke [facility](HCC) 2013    ?  mini    . DVT (deep venous thrombosis) (HCC)   . NSTEMI (non-ST elevated myocardial infarction) [facility](HCC) Jan 2012    with PCI to RCA and DX per Dr. Excell Seltzerooper  . Pneumonia 2016  . Type II diabetes mellitus (HCC)     long standing  . Chronic kidney disease (CKD), stage III (moderate)    Past Surgical History  Procedure Laterality Date  . Appendectomy    . Tonsillectomy    . Ankle fusion Bilateral   . Coronary angioplasty with stent placement  04/05/2010    distal RCA  . Fracture surgery Right 2005    bilateral ankles   . Abdominal aortagram N/A 01/17/2014    Procedure: ABDOMINAL AORTAGRAM;  Surgeon: Sherren Kernsharles E Fields, MD;  [facility];  Service: Cardiovascular;  Laterality: N/A;  . Cataract extraction, bilateral Bilateral    Family History  Problem Relation Age of Onset  . Heart disease Neg Hx     both parents were murdered   Social History  Substance Use Topics  . Smoking status: Former Games developermoker  . Smokeless tobacco: Never Used      Comment: "smoked in my teens; quit after 1 year or so"  . Alcohol Use: No   OB History    No data available     Review of Systems  Ten systems are reviewed and are negative for acute change except as noted in the HPI  Allergies  Percocet; Sulfa antibiotics; Metformin and related; Tradjenta; and Penicillins  Home Medications   Prior to Admission medications   Medication Sig Start Date End Date Taking? Authorizing Provider  aspirin 81 MG tablet Take 81 mg by mouth daily. For heart health    Historical Provider, MD  Capsaicin-Menthol-Methyl Sal (CAPSAICIN-METHYL SAL-MENTHOL) 0.025-1-12 % CREA Apply 1 application topically 2 (two) times daily. 03/13/15   Tharon AquasFrank C Patrick, PA  carvedilol (COREG) 3.125 MG tablet One twice daily to control BP and strengthen the heart 05/26/15   Kimber RelicArthur G Green, MD  cloNIDine (CATAPRES) 0.1 MG tablet Take 1 tablet (0.1 mg total) by mouth daily. For high blood pressure 09/04/14   Sharon SellerJessica K Eubanks, NP  ezetimibe-simvastatin (VYTORIN) 10-20 MG tablet 1/2 tablet nightly to control cholesterol 05/26/15   Kimber RelicArthur G Green, MD  furosemide (LASIX) 20  MG tablet Take 1 tablet (20 mg total) by mouth daily. For swelling 09/04/14   Sharon Seller, NP  glucose blood test strip E11.59 Check blood sugar daily as directed 08/07/14   Sharon Seller, NP  linagliptin (TRADJENTA) 5 MG TABS tablet Take 1 tablet (5 mg total) by mouth daily. 06/09/15   Edison Pace, RPH-CPP  losartan (COZAAR) 50 MG tablet Take 1 tablet (50 mg total) by mouth daily. For high blood pressure 09/04/14   Sharon Seller, NP  LOTEMAX 0.5 % GEL One drop in the left eye twice daly to control glaucome 05/26/15   Kimber Relic, MD  Memantine HCl-Donepezil HCl [facility]) 28-10 MG CP24 Take 1 capsule by mouth daily. each evening to help preserve memory 06/10/15   Kimber Relic, MD  nitroGLYCERIN (NITROSTAT) 0.4 MG SL tablet Dissolve one tablet under tongue as needed for chest pain. May repeat as needed every 5  minutes up to 3 doses. Seek medical attention if pain is not relieved after 3 doses 10/27/14   Sharon Seller, NP  polyethylene glycol powder (GLYCOLAX/MIRALAX) powder DISSOLVE 17 GRAMS (1 CAPFUL) INTO 8 OUNCES OF LIQUID AND DRINK EVERY DAY AS NEEDED FOR BOWELS 05/18/15   Kimber Relic, MD  timolol (BETIMOL) 0.5 % ophthalmic solution One drop in the left eye daily to help control glaucoma 05/26/15   Kimber Relic, MD   There were no vitals taken for this visit. Physical Exam  Constitutional: She is oriented to person, place, and time. She appears well-developed and well-nourished. No distress.  HENT:  Head: Normocephalic and atraumatic.  Mouth/Throat: Oropharynx is clear and moist. No oropharyngeal exudate.  Eyes: Conjunctivae are normal. Pupils are equal, round, and reactive to light. Right eye exhibits no discharge. Left eye exhibits no discharge. No scleral icterus.  Neck: No tracheal deviation present.  Cardiovascular: Normal rate, regular rhythm, normal heart sounds and intact distal pulses.  Exam reveals no gallop and no friction rub.   No murmur heard. Pulmonary/Chest: Effort normal and breath sounds normal. No respiratory distress. She has no wheezes. She has no rales. She exhibits no tenderness.  Abdominal: Soft. Bowel sounds are normal. She exhibits no distension and no mass. There is no tenderness. There is no rebound and no guarding.  Musculoskeletal: Normal range of motion. She exhibits no edema or tenderness.  Lymphadenopathy:    She has no cervical adenopathy.  Neurological: She is alert and oriented to person, place, and time. No cranial nerve deficit. Coordination normal.  Cranial nerves 2 through 12 intact, normal finger to nose, RAM, pronator drift  Skin: Skin is warm and dry. No rash noted. She is not diaphoretic. No erythema.  Psychiatric: She has a normal mood and affect. Her behavior is normal.  Nursing note and vitals reviewed.   ED Course  Procedures  Labs  Review Labs Reviewed  CBC - Abnormal; Notable for the following:    RBC 3.83 (*)    Hemoglobin 11.7 (*)    HCT 34.8 (*)    Platelets 133 (*)    All other components within normal limits  URINALYSIS, ROUTINE W REFLEX MICROSCOPIC (NOT AT [facility]) - Abnormal; Notable for the following:    Glucose, UA 250 (*)    Protein, ur 30 (*)    All other components within normal limits  COMPREHENSIVE METABOLIC PANEL - Abnormal; Notable for the following:    CO2 19 (*)    Glucose, Bld 247 (*)    BUN 76 (*)  Creatinine, Ser 1.56 (*)    Calcium 8.7 (*)    Total Protein 6.0 (*)    Albumin 3.3 (*)    GFR calc non Af Amer 29 (*)    GFR calc Af Amer 34 (*)    All other components within normal limits  URINE MICROSCOPIC-ADD ON - Abnormal; Notable for the following:    Squamous Epithelial / LPF 0-5 (*)    Bacteria, UA RARE (*)    All other components within normal limits  CULTURE, BLOOD (ROUTINE X 2)  CULTURE, BLOOD (ROUTINE X 2)  LIPASE, BLOOD  I-STAT TROPOININ, ED  I-STAT CG4 LACTIC ACID, ED    Imaging Review Dg Chest 2 View  06/12/2015  CLINICAL DATA:  Headache and nausea up beginning this morning. Hypertension and diabetes. EXAM: CHEST  2 VIEW COMPARISON:  04/03/2015 and previous FINDINGS: The heart is mildly enlarged with left ventricular prominence. There is atherosclerosis of the aorta. Coronary stents are in place. There is linear scarring in the lingula. The lungs are otherwise well aerated. No effusions. Pulmonary vascularity is normal. No acute bone finding. IMPRESSION: Left ventricular prominence. Atherosclerosis. Coronary stents. No active pulmonary disease. Electronically Signed   By: Paulina Fusi M.D.   On: 06/12/2015 15:17   Ct Head Wo Contrast  06/12/2015  CLINICAL DATA:  Nausea and dizziness for 4 days EXAM: CT HEAD WITHOUT CONTRAST TECHNIQUE: Contiguous axial images were obtained from the base of the skull through the vertex without intravenous contrast. COMPARISON:  Head CT September 05, 2009 and brain MRI September 05, 2009 FINDINGS: There is mild diffuse atrophy. There is no intracranial mass, hemorrhage, extra-axial fluid collection, or midline shift. There is slight small vessel disease in the centra semiovale bilaterally. Elsewhere, gray-white compartments appear normal. No acute infarct is evident. Bony calvarium appears intact. The mastoid air cells are clear. There is mild debris in each external auditory canal. IMPRESSION: Atrophy with slight periventricular small vessel disease. No intracranial mass, hemorrhage, or evidence of acute infarct. Probable cerumen in each external auditory canal. Electronically Signed   By: Bretta Bang III M.D.   On: 06/12/2015 15:25    I have personally reviewed and evaluated these images and lab results as part of my medical decision-making.   EKG Interpretation   Date/Time:  Friday June 12 2015 13:38:08 EDT Ventricular Rate:  56 PR Interval:  186 QRS Duration: 147 QT Interval:  497 QTC Calculation: 480 R Axis:   -41 Text Interpretation:  Sinus rhythm Atrial premature complex Left bundle  branch block No significant change since last tracing Confirmed by KNOTT  MD, DANIEL ([id number]) on 06/12/2015 3:09:27 PM      MDM   Final diagnoses:  None   Patient was pale and uncomfortable appearing upon arrival. Broad workup initiated for infectious vs cardiac vs neuro. Hypothermia- rectal 95.2. Will obtain TSH and free T4.  Lipase, CBC, troponin, lactic, CT head, CXR, EKG unremarkable for acute change. UA with glucosuria.  Urinary retention- bladder scan revealed >650 cc urine. BUN trending upwards- concern for GI bleed; however, patient denies hematochezia, coffee ground emesis, she's not hypotensive, hgb 11.7.  Patient will need admission for hypothermia and uptrending BUN.  Dr. Clydene Pugh evaluated patient as well and agrees with above plan.   Melton Krebs, PA-C 06/15/15 1217  Lyndal Pulley, MD 06/16/15 [phone]

## 2015-06-13 DIAGNOSIS — T887XXA Unspecified adverse effect of drug or medicament, initial encounter: Secondary | ICD-10-CM | POA: Diagnosis not present

## 2015-06-13 DIAGNOSIS — E1121 Type 2 diabetes mellitus with diabetic nephropathy: Secondary | ICD-10-CM

## 2015-06-13 DIAGNOSIS — R42 Dizziness and giddiness: Secondary | ICD-10-CM | POA: Diagnosis not present

## 2015-06-13 DIAGNOSIS — R112 Nausea with vomiting, unspecified: Secondary | ICD-10-CM | POA: Diagnosis not present

## 2015-06-13 LAB — GASTROINTESTINAL PANEL BY PCR, STOOL (REPLACES STOOL CULTURE)
Adenovirus F40/41: NOT DETECTED
Astrovirus: NOT DETECTED
CAMPYLOBACTER SPECIES: NOT DETECTED
CRYPTOSPORIDIUM: NOT DETECTED
Cyclospora cayetanensis: NOT DETECTED
E. COLI O157: NOT DETECTED
ENTEROAGGREGATIVE E COLI (EAEC): NOT DETECTED
Entamoeba histolytica: NOT DETECTED
Enteropathogenic E coli (EPEC): NOT DETECTED
Enterotoxigenic E coli (ETEC): NOT DETECTED
GIARDIA LAMBLIA: NOT DETECTED
NOROVIRUS GI/GII: NOT DETECTED
PLESIMONAS SHIGELLOIDES: NOT DETECTED
ROTAVIRUS A: NOT DETECTED
SALMONELLA SPECIES: NOT DETECTED
SAPOVIRUS (I, II, IV, AND V): NOT DETECTED
SHIGA LIKE TOXIN PRODUCING E COLI (STEC): NOT DETECTED
SHIGELLA/ENTEROINVASIVE E COLI (EIEC): NOT DETECTED
Vibrio cholerae: NOT DETECTED
Vibrio species: NOT DETECTED
YERSINIA ENTEROCOLITICA: NOT DETECTED

## 2015-06-13 LAB — CBC
HCT: 32.3 % — ABNORMAL LOW (ref 36.0–46.0)
HEMOGLOBIN: 10.4 g/dL — AB (ref 12.0–15.0)
MCH: 29 pg (ref 26.0–34.0)
MCHC: 32.2 g/dL (ref 30.0–36.0)
MCV: 90 fL (ref 78.0–100.0)
PLATELETS: 156 10*3/uL (ref 150–400)
RBC: 3.59 MIL/uL — AB (ref 3.87–5.11)
RDW: 13.2 % (ref 11.5–15.5)
WBC: 5.2 10*3/uL (ref 4.0–10.5)

## 2015-06-13 LAB — BASIC METABOLIC PANEL
ANION GAP: 10 (ref 5–15)
BUN: 69 mg/dL — ABNORMAL HIGH (ref 6–20)
CALCIUM: 8.5 mg/dL — AB (ref 8.9–10.3)
CO2: 21 mmol/L — AB (ref 22–32)
CREATININE: 1.78 mg/dL — AB (ref 0.44–1.00)
Chloride: 110 mmol/L (ref 101–111)
GFR, EST AFRICAN AMERICAN: 29 mL/min — AB (ref >60)
GFR, EST NON AFRICAN AMERICAN: 25 mL/min — AB (ref >60)
Glucose, Bld: 140 mg/dL — ABNORMAL HIGH (ref 65–99)
Potassium: 4.3 mmol/L (ref 3.5–5.1)
SODIUM: 141 mmol/L (ref 135–145)

## 2015-06-13 LAB — GLUCOSE, CAPILLARY
GLUCOSE-CAPILLARY: 105 mg/dL — AB (ref 65–99)
GLUCOSE-CAPILLARY: 108 mg/dL — AB (ref 65–99)
GLUCOSE-CAPILLARY: 124 mg/dL — AB (ref 65–99)
GLUCOSE-CAPILLARY: 87 mg/dL (ref 65–99)
Glucose-Capillary: 133 mg/dL — ABNORMAL HIGH (ref 65–99)
Glucose-Capillary: 105 mg/dL — ABNORMAL HIGH (ref 65–99)
Glucose-Capillary: 174 mg/dL — ABNORMAL HIGH (ref 65–99)

## 2015-06-13 NOTE — Progress Notes (Signed)
Pt states she is "miserable" but does not want anything for pain.  Assisted up for CL breakfast.  Pt is HOH but dtr is bringing in batteries for hearing aides.

## 2015-06-13 NOTE — Progress Notes (Signed)
[facility] PROGRESS NOTE  [name] PNT:[id number] DOB: [birth year] DOA: 06/12/2015 PCP: Kimber Relic, MD  Assessment/Plan: Nausea, vomiting 2/2 drug side effect. She complains of dizziness which could be contributing to the nausea and vomiting as well. Head CTscan negative.  Likely sx are due to new med and not gastroenteritis.  -Admitted to observation - Medical bed -Gentle IV hydration ( cardiac function unknown but she is on Coreg) -hold Namzaric -GI pathogen panel -prn Zofran Q6 hours x 4 doses -clear liquid diet, will adv as tolerated in AM -Unfortunately the half life of this medication is 60-80 hours patient is still complaining of symptoms of the medication. Further compounding the issue is that this medication is excreted renally and patient has mild renal impairment. I suspect that it will take her 2 weeks at least clear the drug.  Dizziness, likely multifactorial due to dehydration and drug side effect; improved - Head CTscan negative -Gentle IV hydration through tonight -consider trial of meclizine is no improvement  CKD stage 3- 4, based on previous labs. BUN to Cr ratio is elevated likely 2/2 Dehydration. Baseline around 1.5 -IV hydration, gently to prevent fluid overload -am bmet -avoid nephrotoxic medications  Urinary retention. Etiology? Doesn't take anti-cholinergics nor narcotics.  -foley already placed, will continue, likely removal in morning -if fails voiding trial after removal of foley she may need imaging to evaluate for obstructive process.   Hypothermia, rectal temp 95.2. Normal WBC and lactic acid. Doubt sepsis at this point. Resolved.  Hypertension. Currently stable  -hold lasix, due to a chaotic - hold home cozaar, coreg and clonidine and will resume as blood pressure demands -prn hydralazine.   Diabetes, type 2. Uncontrolled.  -Hold home tradjenta -CBGs Q4 with SSI. Change to [facility] and HS when taking PO  Hyperlipidemia.   -hold lipid medications, will resume tomorrow  -?dementia.  Today-patient is alert and oriented 3 has good insight and is competent. Need to speak to dgtr. Yesterday PCP gave her a sample pack of Namzaric a couple of days ago. Not sure if she started it as daughter told me patient had not had any recent medication changes and now daughter not in room to verify. Holding the medication as current symptoms (urinary retention / nausea and vomiting) are known side effects of Namzaricy.   Code Status: DO NOT RESUSCITATE Family Communication: Housemate  (indicate person spoken with, relationship, and if by phone, the number) Disposition Plan: Likely discharge home   Consultants:  None  Procedures:  None  Antibiotics:  None (indicate start date, and stop date if known)  HPI/Subjective: Patient states is a lot better today. Patient states that she has had no vomiting. Patient has mild nausea. Patient also has mild dizziness. Patient describes the dizziness as the room is spinning but she isn't. Patient denies history of vertigo  Patient denies fever shortness of breath cough wheeze headache blurry vision fever abdominal pain.  Objective: Filed Vitals:   06/13/15 0441 06/13/15 1410  BP: 116/72 132/65  Pulse: 66 68  Temp: 98.8 F (37.1 C) 98.2 F (36.8 C)  Resp: 19 16    Intake/Output Summary (Last 24 hours) at 06/13/15 1757 Last data filed at 06/13/15 1700  Gross per 24 hour  Intake   2000 ml  Output   2102 ml  Net   -102 ml   Filed Weights   06/12/15 1942  Weight: 61.689 kg (136 lb)    Exam:  General:  No diaphoresis, no acute distress,nystagmus  or strabismus, truncal stability Cardiovascular: Regular rate and rhythm no murmurs rubs or gallops Respiratory: Clear to auscultation bilaterally no more breathing Abdomen: Nondistended bowel sounds normal nontender palpation Musculoskeletal: Moving all extremities, no deformity, 5 out of 5 strength   Data  Reviewed: Basic Metabolic Panel:  Recent Labs Lab 06/08/15 1053 06/12/15 1355 06/13/15 0515  NA 141 138 141  K 5.0 4.4 4.3  CL 103 107 110  CO2 20 19* 21*  GLUCOSE 187* 247* 140*  BUN 67* 76* 69*  CREATININE 1.89* 1.56* 1.78*  CALCIUM 8.9 8.7* 8.5*   Liver Function Tests:  Recent Labs Lab 06/08/15 1053 06/12/15 1355  AST 14 24  ALT 9 18  ALKPHOS 69 65  BILITOT 0.3 0.5  PROT 5.4* 6.0*  ALBUMIN 3.4* 3.3*    Recent Labs Lab 06/12/15 1355  LIPASE 44   No results for input(s): AMMONIA in the last 168 hours. CBC:  Recent Labs Lab 06/08/15 1053 06/12/15 1355 06/13/15 0515  WBC 5.7 4.4 5.2  NEUTROABS 3.2  --   --   HGB  --  11.7* 10.4*  HCT 36.7 34.8* 32.3*  MCV 92 90.9 90.0  PLT 201 133* 156   Cardiac Enzymes: No results for input(s): CKTOTAL, CKMB, CKMBINDEX, TROPONINI in the last 168 hours. BNP (last 3 results)  Recent Labs  04/02/15 0913  BNP 125.4*    ProBNP (last 3 results) No results for input(s): PROBNP in the last 8760 hours.  CBG:  Recent Labs Lab 06/13/15 0010 06/13/15 0441 06/13/15 0732 06/13/15 1136 06/13/15 1720  GLUCAP 105* 124* 108* 133* 105*    Recent Results (from the past 240 hour(s))  Blood culture (routine x 2)     Status: None (Preliminary result)   Collection Time: 06/12/15  1:40 PM  Result Value Ref Range Status   Specimen Description BLOOD LEFT HAND  Final   Special Requests IN PEDIATRIC BOTTLE 3CC  Final   Culture NO GROWTH < 24 HOURS  Final   Report Status PENDING  Incomplete  Blood culture (routine x 2)     Status: None (Preliminary result)   Collection Time: 06/12/15  1:51 PM  Result Value Ref Range Status   Specimen Description BLOOD RIGHT HAND  Final   Special Requests IN PEDIATRIC BOTTLE 1CC  Final   Culture NO GROWTH < 24 HOURS  Final   Report Status PENDING  Incomplete  Gastrointestinal Panel by PCR , Stool     Status: None   Collection Time: 06/12/15  5:42 PM  Result Value Ref Range Status    Campylobacter species NOT DETECTED NOT DETECTED Final   Plesimonas shigelloides NOT DETECTED NOT DETECTED Final   Salmonella species NOT DETECTED NOT DETECTED Final   Yersinia enterocolitica NOT DETECTED NOT DETECTED Final   Vibrio species NOT DETECTED NOT DETECTED Final   Vibrio cholerae NOT DETECTED NOT DETECTED Final   Enteroaggregative E coli (EAEC) NOT DETECTED NOT DETECTED Final   Enteropathogenic E coli (EPEC) NOT DETECTED NOT DETECTED Final   Enterotoxigenic E coli (ETEC) NOT DETECTED NOT DETECTED Final   Shiga like toxin producing E coli (STEC) NOT DETECTED NOT DETECTED Final   E. coli O157 NOT DETECTED NOT DETECTED Final   Shigella/Enteroinvasive E coli (EIEC) NOT DETECTED NOT DETECTED Final   Cryptosporidium NOT DETECTED NOT DETECTED Final   Cyclospora cayetanensis NOT DETECTED NOT DETECTED Final   Entamoeba histolytica NOT DETECTED NOT DETECTED Final   Giardia lamblia NOT DETECTED NOT DETECTED Final  Adenovirus F40/41 NOT DETECTED NOT DETECTED Final   Astrovirus NOT DETECTED NOT DETECTED Final   Norovirus GI/GII NOT DETECTED NOT DETECTED Final   Rotavirus A NOT DETECTED NOT DETECTED Final   Sapovirus (I, II, IV, and V) NOT DETECTED NOT DETECTED Final     Studies: Dg Chest 2 View  06/12/2015  CLINICAL DATA:  Headache and nausea up beginning this morning. Hypertension and diabetes. EXAM: CHEST  2 VIEW COMPARISON:  04/03/2015 and previous FINDINGS: The heart is mildly enlarged with left ventricular prominence. There is atherosclerosis of the aorta. Coronary stents are in place. There is linear scarring in the lingula. The lungs are otherwise well aerated. No effusions. Pulmonary vascularity is normal. No acute bone finding. IMPRESSION: Left ventricular prominence. Atherosclerosis. Coronary stents. No active pulmonary disease. Electronically Signed   By: Paulina FusiMark  Shogry M.D.   On: 06/12/2015 15:17   Ct Head Wo Contrast  06/12/2015  CLINICAL DATA:  Nausea and dizziness for 4 days  EXAM: CT HEAD WITHOUT CONTRAST TECHNIQUE: Contiguous axial images were obtained from the base of the skull through the vertex without intravenous contrast. COMPARISON:  Head CT September 05, 2009 and brain MRI September 05, 2009 FINDINGS: There is mild diffuse atrophy. There is no intracranial mass, hemorrhage, extra-axial fluid collection, or midline shift. There is slight small vessel disease in the centra semiovale bilaterally. Elsewhere, gray-white compartments appear normal. No acute infarct is evident. Bony calvarium appears intact. The mastoid air cells are clear. There is mild debris in each external auditory canal. IMPRESSION: Atrophy with slight periventricular small vessel disease. No intracranial mass, hemorrhage, or evidence of acute infarct. Probable cerumen in each external auditory canal. Electronically Signed   By: Bretta BangWilliam  Woodruff III M.D.   On: 06/12/2015 15:25    Scheduled Meds: . acetaminophen  650 mg Oral Once  . enoxaparin (LOVENOX) injection  30 mg Subcutaneous Q24H  . insulin aspart  0-9 Units Subcutaneous 6 times per day  . ondansetron (ZOFRAN) IV  4 mg Intravenous 4 times per day  . timolol  1 drop Left Eye Daily   Continuous Infusions: . sodium chloride 50 mL/hr at 06/12/15 1800    Active Problems:   Coronary artery disease   Diabetes mellitus with nephropathy (HCC)   Nausea & vomiting   Dizziness   Urinary retention    Time spent: 20    Haydee SalterPhillip M Hobbs  [facility] Pager [phone].  If 7PM-7AM, please contact night-coverage at [email or web address], password [email or web address] 06/13/2015, 5:57 PM

## 2015-06-13 NOTE — Progress Notes (Signed)
Spoke with visitors.  Pt states she was started on a new med this past week and it was after that when she got so sick.

## 2015-06-13 NOTE — Progress Notes (Signed)
Concerned about pt situation at home, she states she has not eaten in 7 days at home.  She has no appetite but also that her dtr, Bonita QuinLinda, does not cook food fitting to eat.

## 2015-06-14 DIAGNOSIS — E1121 Type 2 diabetes mellitus with diabetic nephropathy: Secondary | ICD-10-CM | POA: Diagnosis not present

## 2015-06-14 DIAGNOSIS — T887XXA Unspecified adverse effect of drug or medicament, initial encounter: Secondary | ICD-10-CM | POA: Diagnosis not present

## 2015-06-14 DIAGNOSIS — R112 Nausea with vomiting, unspecified: Secondary | ICD-10-CM | POA: Diagnosis not present

## 2015-06-14 LAB — GLUCOSE, CAPILLARY
Glucose-Capillary: 96 mg/dL (ref 65–99)
Glucose-Capillary: 95 mg/dL (ref 65–99)
Glucose-Capillary: 185 mg/dL — ABNORMAL HIGH (ref 65–99)
Glucose-Capillary: 117 mg/dL — ABNORMAL HIGH (ref 65–99)
Glucose-Capillary: 162 mg/dL — ABNORMAL HIGH (ref 65–99)

## 2015-06-14 LAB — C DIFFICILE QUICK SCREEN W PCR REFLEX
C DIFFICILE (CDIFF) TOXIN: NEGATIVE
C Diff antigen: NEGATIVE
C Diff interpretation: NEGATIVE

## 2015-06-14 MED ORDER — SODIUM CHLORIDE 0.9 % IV SOLN: INTRAVENOUS | Status: DC

## 2015-06-14 NOTE — Progress Notes (Signed)
[facility] PROGRESS NOTE  Ashley RheinDoris E Leidner AVW:[id number] DOB: Jan 24, 1928 DOA: 06/12/2015 PCP: Kimber RelicGREEN, ARTHUR G, MD  Assessment/Plan: Nausea, vomiting 2/2 drug side effect. She complains of dizziness which could be contributing to the nausea and vomiting as well. Head CTscan negative.  Likely sx are due to new med and not gastroenteritis.  -Admitted to observation - Medical bed -Gentle IV hydration ( cardiac function unknown but she is on Coreg) -hold Namzaric -GI pathogen panel neg -prn Zofran Q6 hours x 4 doses -diet adv as tolerated in AM -Unfortunately the half life of this medication is 60-80 hours patient is still complaining of symptoms of the medication. Further compounding the issue is that this medication is excreted renally and patient has mild renal impairment. I suspect that it will take her 2 weeks at least clear the drug.  Dizziness, likely multifactorial due to dehydration and drug side effect; improved again - Head CTscan negative -Gentle IV hydration through tonight -consider trial of meclizine is no improvement  CKD stage 3- 4, based on previous labs. BUN to Cr ratio is elevated likely 2/2 Dehydration. Baseline around 1.5 -IV hydration, gently to prevent fluid overload -am bmet -avoid nephrotoxic medications  Urinary retention. Etiology? Doesn't take anti-cholinergics nor narcotics.  -foley already placed, will continue, likely removal in morning -if fails voiding trial after removal of foley she may need imaging to evaluate for obstructive process.   Hypothermia, rectal temp 95.2. Normal WBC and lactic acid. Doubt sepsis at this point. Resolved.  Hypertension. Currently stable  -hold lasix, due to a chaotic - hold home cozaar, coreg and clonidine and will resume as blood pressure demands -prn hydralazine.   Diabetes, type 2. Uncontrolled.  -Hold home tradjenta -CBGs Q4 with [facility]SI. [facility]  Hyperlipidemia.  -hold lipid  medications, will resume tomorrow  -?dementia.  Today-patient is alert and oriented 3 has good insight and is competent. Need to speak to dgtr. Yesterday PCP gave her a sample pack of Namzaric a couple of days ago. Not sure if she started it as daughter told me patient had not had any recent medication changes and now daughter not in room to verify. Holding the medication as current symptoms (urinary retention / nausea and vomiting) are known side effects of Namzaricy.   Code Status: DNR Family Communication: none (indicate person spoken with, relationship, and if by phone, the number) Disposition Plan: Home   Consultants:  none  Procedures:  none  Antibiotics:  none (indicate start date, and stop date if known)  HPI/Subjective: Patient states is a lot better today. States that she has had no vomiting. Patient also has mild dizziness. Pt is afraid of eating.  Objective: Filed Vitals:   06/14/15 1418 06/14/15 2200  BP: 114/83 101/59  Pulse: 70 58  Temp: 99.2 F (37.3 C) 98.7 F (37.1 C)  Resp: 16 17    Intake/Output Summary (Last 24 hours) at 06/15/15 1359 Last data filed at 06/14/15 1700  Gross per 24 hour  Intake    240 ml  Output      0 ml  Net    240 ml   Filed Weights   06/12/15 1942  Weight: 61.689 kg (136 lb)    Exam:   General:  No diaphoresis, NAD  Cardiovascular: RRR, No MRG  Respiratory: CTAB, nl WOB  Abdomen: NTTP, BS+, ND  Musculoskeletal: moving all extremities  Data Reviewed: Basic Metabolic Panel:  Recent Labs Lab 06/12/15 1355 06/13/15 0515  NA 138 141  K 4.4 4.3  CL 107 110  CO2 19* 21*  GLUCOSE 247* 140*  BUN 76* 69*  CREATININE 1.56* 1.78*  CALCIUM 8.7* 8.5*   Liver Function Tests:  Recent Labs Lab 06/12/15 1355  AST 24  ALT 18  ALKPHOS 65  BILITOT 0.5  PROT 6.0*  ALBUMIN 3.3*    Recent Labs Lab 06/12/15 1355  LIPASE 44   No results for input(s): AMMONIA in the last 168 hours. CBC:  Recent  Labs Lab 06/12/15 1355 06/13/15 0515  WBC 4.4 5.2  HGB 11.7* 10.4*  HCT 34.8* 32.3*  MCV 90.9 90.0  PLT 133* 156   Cardiac Enzymes: No results for input(s): CKTOTAL, CKMB, CKMBINDEX, TROPONINI in the last 168 hours. BNP (last 3 results)  Recent Labs  04/02/15 0913  BNP 125.4*    ProBNP (last 3 results) No results for input(s): PROBNP in the last 8760 hours.    Recent Results (from the past 240 hour(s))  Blood culture (routine x 2)     Status: None (Preliminary result)   Collection Time: 06/12/15  1:40 PM  Result Value Ref Range Status   Specimen Description BLOOD LEFT HAND  Final   Special Requests IN PEDIATRIC BOTTLE 3CC  Final   Culture NO GROWTH 3 DAYS  Final   Report Status PENDING  Incomplete  Blood culture (routine x 2)     Status: None (Preliminary result)   Collection Time: 06/12/15  1:51 PM  Result Value Ref Range Status   Specimen Description BLOOD RIGHT HAND  Final   Special Requests IN PEDIATRIC BOTTLE 1CC  Final   Culture NO GROWTH 3 DAYS  Final   Report Status PENDING  Incomplete  Gastrointestinal Panel by PCR , Stool     Status: None   Collection Time: 06/12/15  5:42 PM  Result Value Ref Range Status   Campylobacter species NOT DETECTED NOT DETECTED Final   Plesimonas shigelloides NOT DETECTED NOT DETECTED Final   Salmonella species NOT DETECTED NOT DETECTED Final   Yersinia enterocolitica NOT DETECTED NOT DETECTED Final   Vibrio species NOT DETECTED NOT DETECTED Final   Vibrio cholerae NOT DETECTED NOT DETECTED Final   Enteroaggregative E coli (EAEC) NOT DETECTED NOT DETECTED Final   Enteropathogenic E coli (EPEC) NOT DETECTED NOT DETECTED Final   Enterotoxigenic E coli (ETEC) NOT DETECTED NOT DETECTED Final   Shiga like toxin producing E coli (STEC) NOT DETECTED NOT DETECTED Final   E. coli O157 NOT DETECTED NOT DETECTED Final   Shigella/Enteroinvasive E coli (EIEC) NOT DETECTED NOT DETECTED Final   Cryptosporidium NOT DETECTED NOT DETECTED  Final   Cyclospora cayetanensis NOT DETECTED NOT DETECTED Final   Entamoeba histolytica NOT DETECTED NOT DETECTED Final   Giardia lamblia NOT DETECTED NOT DETECTED Final   Adenovirus F40/41 NOT DETECTED NOT DETECTED Final   Astrovirus NOT DETECTED NOT DETECTED Final   Norovirus GI/GII NOT DETECTED NOT DETECTED Final   Rotavirus A NOT DETECTED NOT DETECTED Final   Sapovirus (I, II, IV, and V) NOT DETECTED NOT DETECTED Final  C difficile quick scan w PCR reflex     Status: None   Collection Time: 06/13/15  9:30 PM  Result Value Ref Range Status   C Diff antigen NEGATIVE NEGATIVE Final   C Diff toxin NEGATIVE NEGATIVE Final   C Diff interpretation Negative for toxigenic C. difficile  Final     Studies: No results found.  Scheduled Meds: . enoxaparin (LOVENOX) injection  30 mg Subcutaneous Q24H  . insulin aspart  0-9 Units Subcutaneous 6 times per day  . timolol  1 drop Left Eye Daily   Continuous Infusions: . sodium chloride 100 mL/hr at 06/14/15 9147    Principal Problem:   Adverse drug reaction Active Problems:   Coronary artery disease   Diabetes mellitus with nephropathy (HCC)   Nausea & vomiting   Dizziness   Urinary retention    Time spent: 20    Haydee Salter  [facility] Pager [phone]. . If 7PM-7AM, please contact night-coverage at [email or web address], password [email or web address] 06/15/2015, 1:59 PM

## 2015-06-15 DIAGNOSIS — R112 Nausea with vomiting, unspecified: Secondary | ICD-10-CM | POA: Diagnosis not present

## 2015-06-15 DIAGNOSIS — R42 Dizziness and giddiness: Secondary | ICD-10-CM | POA: Diagnosis not present

## 2015-06-15 DIAGNOSIS — T887XXA Unspecified adverse effect of drug or medicament, initial encounter: Secondary | ICD-10-CM | POA: Diagnosis not present

## 2015-06-15 DIAGNOSIS — T50905A Adverse effect of unspecified drugs, medicaments and biological substances, initial encounter: Secondary | ICD-10-CM | POA: Diagnosis present

## 2015-06-15 DIAGNOSIS — E1121 Type 2 diabetes mellitus with diabetic nephropathy: Secondary | ICD-10-CM | POA: Diagnosis not present

## 2015-06-15 LAB — GLUCOSE, CAPILLARY
GLUCOSE-CAPILLARY: 247 mg/dL — AB (ref 65–99)
Glucose-Capillary: 122 mg/dL — ABNORMAL HIGH (ref 65–99)

## 2015-06-15 LAB — BASIC METABOLIC PANEL
ANION GAP: 7 (ref 5–15)
BUN: 42 mg/dL — ABNORMAL HIGH (ref 6–20)
CALCIUM: 8.6 mg/dL — AB (ref 8.9–10.3)
CO2: 22 mmol/L (ref 22–32)
Chloride: 114 mmol/L — ABNORMAL HIGH (ref 101–111)
Creatinine, Ser: 1.48 mg/dL — ABNORMAL HIGH (ref 0.44–1.00)
GFR, EST AFRICAN AMERICAN: 36 mL/min — AB (ref >60)
GFR, EST NON AFRICAN AMERICAN: 31 mL/min — AB (ref >60)
Glucose, Bld: 133 mg/dL — ABNORMAL HIGH (ref 65–99)
Potassium: 4.9 mmol/L (ref 3.5–5.1)
Sodium: 143 mmol/L (ref 135–145)

## 2015-06-15 NOTE — Progress Notes (Signed)
Discharge instructions reviewed with pt and 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.  Pt verbalized understanding and had no questions.  Pt discharged in stable condition via wheelchair with boyfriend and family.  [name], [name] [place]

## 2015-06-15 NOTE — Care Management Obs Status (Signed)
MEDICARE OBSERVATION STATUS NOTIFICATION   Patient Details  Name: [name] MRN: [id number] Date of Birth: 02-07-1928   Medicare Observation Status Notification Given:  Yes (Advancing diet )    Kingsley PlanWile, Rayvon Brandvold Marie, RN 06/15/2015, 11:23 AM

## 2015-06-15 NOTE — Discharge Instructions (Signed)

## 2015-06-17 LAB — CULTURE, BLOOD (ROUTINE X 2)
Culture: NO GROWTH
Culture: NO GROWTH

## 2015-06-21 ENCOUNTER — Emergency Department (HOSPITAL_COMMUNITY)
Admission: EM | Admit: 2015-06-21 | Discharge: 2015-06-21 | Disposition: A | Discharge status: Home / Self Care | Payer: Medicare Other | Attending: Emergency Medicine | Admitting: Emergency Medicine

## 2015-06-21 ENCOUNTER — Encounter (HOSPITAL_COMMUNITY): Payer: Self-pay | Admitting: Emergency Medicine

## 2015-06-21 DIAGNOSIS — Z7984 Long term (current) use of oral hypoglycemic drugs: Secondary | ICD-10-CM | POA: Diagnosis not present

## 2015-06-21 DIAGNOSIS — Z9049 Acquired absence of other specified parts of digestive tract: Secondary | ICD-10-CM | POA: Diagnosis not present

## 2015-06-21 DIAGNOSIS — Z87891 Personal history of nicotine dependence: Secondary | ICD-10-CM | POA: Diagnosis not present

## 2015-06-21 DIAGNOSIS — I252 Old myocardial infarction: Secondary | ICD-10-CM | POA: Diagnosis not present

## 2015-06-21 DIAGNOSIS — Z9861 Coronary angioplasty status: Secondary | ICD-10-CM | POA: Insufficient documentation

## 2015-06-21 DIAGNOSIS — Z862 Personal history of diseases of the blood and blood-forming organs and certain disorders involving the immune mechanism: Secondary | ICD-10-CM | POA: Insufficient documentation

## 2015-06-21 DIAGNOSIS — Z8673 Personal history of transient ischemic attack (TIA), and cerebral infarction without residual deficits: Secondary | ICD-10-CM | POA: Insufficient documentation

## 2015-06-21 DIAGNOSIS — I2510 Atherosclerotic heart disease of native coronary artery without angina pectoris: Secondary | ICD-10-CM | POA: Insufficient documentation

## 2015-06-21 DIAGNOSIS — Z880 Allergy status to penicillin: Secondary | ICD-10-CM | POA: Insufficient documentation

## 2015-06-21 DIAGNOSIS — Z8701 Personal history of pneumonia (recurrent): Secondary | ICD-10-CM | POA: Insufficient documentation

## 2015-06-21 DIAGNOSIS — R339 Retention of urine, unspecified: Secondary | ICD-10-CM | POA: Diagnosis not present

## 2015-06-21 DIAGNOSIS — Z86718 Personal history of other venous thrombosis and embolism: Secondary | ICD-10-CM | POA: Insufficient documentation

## 2015-06-21 DIAGNOSIS — E119 Type 2 diabetes mellitus without complications: Secondary | ICD-10-CM | POA: Insufficient documentation

## 2015-06-21 DIAGNOSIS — Z8739 Personal history of other diseases of the musculoskeletal system and connective tissue: Secondary | ICD-10-CM | POA: Insufficient documentation

## 2015-06-21 DIAGNOSIS — E785 Hyperlipidemia, unspecified: Secondary | ICD-10-CM | POA: Diagnosis not present

## 2015-06-21 DIAGNOSIS — Z79899 Other long term (current) drug therapy: Secondary | ICD-10-CM | POA: Diagnosis not present

## 2015-06-21 DIAGNOSIS — E669 Obesity, unspecified: Secondary | ICD-10-CM | POA: Insufficient documentation

## 2015-06-21 DIAGNOSIS — N183 Chronic kidney disease, stage 3 (moderate): Secondary | ICD-10-CM | POA: Diagnosis not present

## 2015-06-21 DIAGNOSIS — I129 Hypertensive chronic kidney disease with stage 1 through stage 4 chronic kidney disease, or unspecified chronic kidney disease: Secondary | ICD-10-CM | POA: Insufficient documentation

## 2015-06-21 DIAGNOSIS — R109 Unspecified abdominal pain: Secondary | ICD-10-CM | POA: Diagnosis present

## 2015-06-21 DIAGNOSIS — I5030 Unspecified diastolic (congestive) heart failure: Secondary | ICD-10-CM | POA: Insufficient documentation

## 2015-06-21 DIAGNOSIS — R197 Diarrhea, unspecified: Secondary | ICD-10-CM | POA: Insufficient documentation

## 2015-06-21 LAB — COMPREHENSIVE METABOLIC PANEL
ALBUMIN: 3.6 g/dL (ref 3.5–5.0)
ALK PHOS: 59 U/L (ref 38–126)
ALT: 19 U/L (ref 14–54)
AST: 22 U/L (ref 15–41)
Anion gap: 7 (ref 5–15)
BUN: 38 mg/dL — ABNORMAL HIGH (ref 6–20)
CALCIUM: 9.2 mg/dL (ref 8.9–10.3)
CO2: 25 mmol/L (ref 22–32)
CREATININE: 1.29 mg/dL — AB (ref 0.44–1.00)
Chloride: 113 mmol/L — ABNORMAL HIGH (ref 101–111)
GFR calc Af Amer: 43 mL/min — ABNORMAL LOW (ref >60)
GFR calc non Af Amer: 37 mL/min — ABNORMAL LOW (ref >60)
GLUCOSE: 136 mg/dL — AB (ref 65–99)
Potassium: 4.7 mmol/L (ref 3.5–5.1)
SODIUM: 145 mmol/L (ref 135–145)
Total Bilirubin: 0.3 mg/dL (ref 0.3–1.2)
Total Protein: 6.3 g/dL — ABNORMAL LOW (ref 6.5–8.1)

## 2015-06-21 LAB — URINALYSIS, ROUTINE W REFLEX MICROSCOPIC
BILIRUBIN URINE: NEGATIVE
Glucose, UA: NEGATIVE mg/dL
HGB URINE DIPSTICK: NEGATIVE
Ketones, ur: NEGATIVE mg/dL
Leukocytes, UA: NEGATIVE
Nitrite: NEGATIVE
PH: 7 (ref 5.0–8.0)
Protein, ur: 30 mg/dL — AB
SPECIFIC GRAVITY, URINE: 1.008 (ref 1.005–1.030)

## 2015-06-21 LAB — CBC WITH DIFFERENTIAL/PLATELET
BASOS PCT: 0 %
Basophils Absolute: 0 10*3/uL (ref 0.0–0.1)
EOS ABS: 0.1 10*3/uL (ref 0.0–0.7)
Eosinophils Relative: 2 %
HCT: 35.3 % — ABNORMAL LOW (ref 36.0–46.0)
HEMOGLOBIN: 11.8 g/dL — AB (ref 12.0–15.0)
LYMPHS ABS: 1.8 10*3/uL (ref 0.7–4.0)
Lymphocytes Relative: 41 %
MCH: 29.7 pg (ref 26.0–34.0)
MCHC: 33.4 g/dL (ref 30.0–36.0)
MCV: 88.9 fL (ref 78.0–100.0)
Monocytes Absolute: 0.3 10*3/uL (ref 0.1–1.0)
Monocytes Relative: 6 %
NEUTROS PCT: 51 %
Neutro Abs: 2.3 10*3/uL (ref 1.7–7.7)
Platelets: 161 10*3/uL (ref 150–400)
RBC: 3.97 MIL/uL (ref 3.87–5.11)
RDW: 13.4 % (ref 11.5–15.5)
WBC: 4.4 10*3/uL (ref 4.0–10.5)

## 2015-06-21 LAB — LIPASE, BLOOD: Lipase: 24 U/L (ref 11–51)

## 2015-06-21 LAB — URINE MICROSCOPIC-ADD ON
Bacteria, UA: NONE SEEN
RBC / HPF: NONE SEEN RBC/hpf (ref 0–5)
Squamous Epithelial / LPF: NONE SEEN
WBC UA: NONE SEEN WBC/hpf (ref 0–5)

## 2015-06-21 MED ORDER — DIPHENOXYLATE-ATROPINE 2.5-0.025 MG PO TABS
2.0000 | ORAL_TABLET | ORAL | Status: AC
  Administered 2015-06-21: 2 via ORAL
  Filled 2015-06-21: qty 2

## 2015-06-21 NOTE — ED Notes (Signed)
Awake. Verbally responsive. A/O x4. Resp even and unlabored. No audible adventitious breath sounds noted. ABC's intact.  

## 2015-06-21 NOTE — ED Notes (Signed)
Pt arrived via EMS with report of lower abd pain with no urination x2 days and diarrhea x3 days s/p to receiving laxative from PMD. Pt denies n/v and fever.

## 2015-06-21 NOTE — ED Notes (Signed)
PT. LAB WAS UNSUCCESSFUL. NURSE AWARE.

## 2015-06-21 NOTE — ED Notes (Signed)
Bed: WA24 Expected date:  Expected time:  Means of arrival:  Comments: hold 

## 2015-06-21 NOTE — ED Notes (Signed)
Bed: WA10 Expected date: 06/21/15 Expected time: 11:54 AM Means of arrival: Ambulance Comments: Diarrhea, abd pain, recently disimpacted

## 2015-06-21 NOTE — ED Provider Notes (Signed)
CSN: [id number]     Arrival date & time 06/21/15  1205 History   First MD Initiated Contact with Patient 06/21/15 1221     Chief Complaint  Patient presents with  . Abdominal Pain     (Consider location/radiation/quality/duration/timing/severity/associated sxs/prior Treatment) HPI [name] is a 82 y.o. female with multiple medical problems including coronary artery disease with stenting, heart failure, diabetes, chronic renal disease, comes in for evaluation of abdominal discomfort. Patient reports she has been unable to urinate over the past 2 days. Although she admits she just urinated in the bedpan in her room, but does not feel that she fully evacuated her bladder. She denies any fevers, chills, nausea or vomiting. She also reports diarrhea over the past 3 days, but admits she was receiving a laxative from primary medical doctor.She is taking laxative for recent bowel impaction that she had several weeks ago. Denies any other medical problems.   Past Medical History  Diagnosis Date  . Coronary artery disease     Remote PCI. Had BMS to RCA and DES stent to Diagonal in Jan. 2012   . Diastolic heart failure     EF 55 to 60% per cath in Jan 2012  . Hyperlipidemia   . Hypertension   . Carotid bruit   . Gout   . Obesity   . TIA (transient ischemic attack)   . Ischemic cardiomyopathy     with prior EF of 35%  . Anemia   . Proteinuria   . Carotid artery occlusion   . Stroke [facility](HCC) 2013    ?  mini    . DVT (deep venous thrombosis) (HCC)   . NSTEMI (non-ST elevated myocardial infarction) [facility](HCC) Jan 2012    with PCI to RCA and DX per Dr. Excell Seltzerooper  . Pneumonia 2016  . Type II diabetes mellitus (HCC)     long standing  . Chronic kidney disease (CKD), stage III (moderate)    Past Surgical History  Procedure Laterality Date  . Appendectomy    . Tonsillectomy    . Ankle fusion Bilateral   . Coronary angioplasty with stent placement  04/05/2010    distal RCA  . Fracture surgery  Right 2005    bilateral ankles   . Abdominal aortagram N/A 01/17/2014    Procedure: ABDOMINAL AORTAGRAM;  Surgeon: Sherren Kernsharles E Fields, MD;  [facility];  Service: Cardiovascular;  Laterality: N/A;  . Cataract extraction, bilateral Bilateral    Family History  Problem Relation Age of Onset  . Heart disease Neg Hx     both parents were murdered   Social History  Substance Use Topics  . Smoking status: Former Games developermoker  . Smokeless tobacco: Never Used     Comment: "smoked in my teens; quit after 1 year or so"  . Alcohol Use: No   OB History    No data available     Review of Systems A 10 point review of systems was completed and was negative except for pertinent positives and negatives as mentioned in the history of present illness     Allergies  Percocet; Sulfa antibiotics; Adhesive; Metformin and related; Tradjenta; and Penicillins  Home Medications   Prior to Admission medications   Medication Sig Start Date End Date Taking? Authorizing Provider  amLODipine (NORVASC) 5 MG tablet Take 5 mg by mouth daily. 04/13/15   Historical Provider, MD  Capsaicin-Menthol-Methyl Sal (CAPSAICIN-METHYL SAL-MENTHOL) 0.025-1-12 % CREA Apply 1 application topically 2 (two) times daily. Patient not  taking: Reported on 06/15/2015 03/13/15   Tharon Aquas, PA  carvedilol (COREG) 3.125 MG tablet One twice daily to control BP and strengthen the heart Patient not taking: Reported on 06/15/2015 05/26/15   Kimber Relic, MD  cloNIDine (CATAPRES) 0.1 MG tablet Take 1 tablet (0.1 mg total) by mouth daily. For high blood pressure 09/04/14   Sharon Seller, NP  ezetimibe-simvastatin (VYTORIN) 10-20 MG tablet 1/2 tablet nightly to control cholesterol 05/26/15   Kimber Relic, MD  furosemide (LASIX) 20 MG tablet Take 1 tablet (20 mg total) by mouth daily. For swelling 09/04/14   Sharon Seller, NP  glucose blood test strip E11.59 Check blood sugar daily as directed 08/07/14   Sharon Seller, NP   losartan (COZAAR) 50 MG tablet Take 1 tablet (50 mg total) by mouth daily. For high blood pressure 09/04/14   Sharon Seller, NP  LOTEMAX 0.5 % GEL One drop in the left eye twice daly to control glaucome 05/26/15   Kimber Relic, MD  metFORMIN (GLUCOPHAGE) 500 MG tablet Take 500 mg by mouth daily. 06/10/15   Historical Provider, MD  nitroGLYCERIN (NITROSTAT) 0.4 MG SL tablet Dissolve one tablet under tongue as needed for chest pain. May repeat as needed every 5 minutes up to 3 doses. Seek medical attention if pain is not relieved after 3 doses 10/27/14   Sharon Seller, NP  omega-3 acid ethyl esters (LOVAZA) 1 g capsule Take 1 g by mouth 2 (two) times daily. 04/13/15   Historical Provider, MD  polyethylene glycol powder (GLYCOLAX/MIRALAX) powder DISSOLVE 17 GRAMS (1 CAPFUL) INTO 8 OUNCES OF LIQUID AND DRINK EVERY DAY AS NEEDED FOR BOWELS 05/18/15   Kimber Relic, MD  PROLENSA 0.07 % SOLN Place 1 drop into the left eye every evening. 05/12/15   Historical Provider, MD  timolol (BETIMOL) 0.5 % ophthalmic solution One drop in the left eye daily to help control glaucoma 05/26/15   Kimber Relic, MD   BP 172/63 mmHg  Pulse 67  Temp(Src) 97.5 F (36.4 C) (Oral)  Resp 16  SpO2 100% Physical Exam  Constitutional: She is oriented to person, place, and time. She appears well-developed and well-nourished.  HENT:  Head: Normocephalic and atraumatic.  Mouth/Throat: Oropharynx is clear and moist.  Eyes: Conjunctivae are normal. Pupils are equal, round, and reactive to light. Right eye exhibits no discharge. Left eye exhibits no discharge. No scleral icterus.  Neck: Neck supple.  Cardiovascular: Normal rate, regular rhythm and normal heart sounds.   Pulmonary/Chest: Effort normal and breath sounds normal. No respiratory distress. She has no wheezes. She has no rales.  Abdominal: Soft. There is no tenderness.  Musculoskeletal: She exhibits no tenderness.  Neurological: She is alert and oriented to person,  place, and time.  Cranial Nerves II-XII grossly intact  Skin: Skin is warm and dry. No rash noted.  Psychiatric: She has a normal mood and affect.  Nursing note and vitals reviewed.   ED Course  Procedures (including critical care time) Labs Review Labs Reviewed  CBC WITH DIFFERENTIAL/PLATELET - Abnormal; Notable for the following:    Hemoglobin 11.8 (*)    HCT 35.3 (*)    All other components within normal limits  COMPREHENSIVE METABOLIC PANEL - Abnormal; Notable for the following:    Chloride 113 (*)    Glucose, Bld 136 (*)    BUN 38 (*)    Creatinine, Ser 1.29 (*)    Total Protein 6.3 (*)  GFR calc non Af Amer 37 (*)    GFR calc Af Amer 43 (*)    All other components within normal limits  URINALYSIS, ROUTINE W REFLEX MICROSCOPIC (NOT AT [facility]) - Abnormal; Notable for the following:    Protein, ur 30 (*)    All other components within normal limits  LIPASE, BLOOD  URINE MICROSCOPIC-ADD ON    Imaging Review No results found. I have personally reviewed and evaluated these images and lab results as part of my medical decision-making.   EKG Interpretation None     Bladder scan performed at bedside by myself shows post void residual of roughly 400 mL. Will in and out cath. MDM  GLORENE LEITZKE is a 82 y.o. female who comes in for evaluation of urinary retention. Patient recently discharged from hospital admission on 3/27 for nausea and vomiting and experienced urinary retention during that time, Foley in hospital that was removed. She also reports she has been taking Namzeric for dementia. She reports for the past 2 days she has been unable to urinate causing diffuse abdominal discomfort. She denies any fevers, chills, nausea or vomiting, back pain. On arrival, she is hemodynamically stable, afebrile. Low suspicion for UTI, pyelonephritis or other systemic infection. She was seen in conjunction with my attending, Dr. Fayrene Fearing. Patient given Foley catheter in ED, will give referral  to urology Foley catheter clinic. Symptoms possibly secondary to the Namzeric or the previous bowel impaction. Discussed ED course and results with the patient and daughter at bedside. Also discussed return precautions. They verbalize understanding and agreement with this plan as well as subsequent discharge. Final diagnoses:  Urinary retention        Joycie Peek, PA-C 06/21/15 2028  Rolland Porter, MD 06/25/15 2330

## 2015-06-21 NOTE — ED Notes (Signed)
Staff tried to get pt off the  bed pan, pt stated "I will stay on the pan until it is time for me to go"

## 2015-06-21 NOTE — Discharge Instructions (Signed)
You will need to keep your Foley catheter in over the next week to help with your urinary retention until your bladder is strong enough to work on its own. Your Exam was reassuring. Your labs showed no evidence of infection. Please follow-up with [facility] in the Foley catheter clinic. Return to ED for any new or worsening symptoms as we discussed.  Acute Urinary Retention, Female Acute urinary retention is the temporary inability to urinate. This is an uncommon problem in women. It can be caused by:  Infection.  A side effect of a medicine.  A problem in a nearby organ that presses or squeezes on the bladder or the urethra (the tube that drains the bladder).  Psychological problems.   Surgery on your bladder, urethra, or pelvic organs that causes obstruction to the outflow of urine from your bladder. HOME CARE INSTRUCTIONS  If you are sent home with a Foley catheter and a drainage system, you will need to discuss the best course of action with your health care provider. While the catheter is in, maintain a good intake of fluids. Keep the drainage bag emptied and lower than your catheter. This is so that contaminated urine will not flow back into your bladder, which could lead to a urinary tract infection. There are two main types of drainage bags. One is a large bag that usually is used at night. It has a good capacity that will allow you to sleep through the night without having to empty it. The second type is called a leg bag. It has a smaller capacity so it needs to be emptied more frequently. However, the main advantage is that it can be attached by a leg strap and goes underneath your clothing, allowing you the freedom to move about or leave your home. Only take over-the-counter or prescription medicines for pain, discomfort, or fever as directed by your health care provider.  SEEK MEDICAL CARE IF:  You develop a low-grade fever.  You experience spasms or leakage of urine with the  spasms. SEEK IMMEDIATE MEDICAL CARE IF:   You develop chills or fever.  Your catheter stops draining urine.  Your catheter falls out.  You start to develop increased bleeding that does not respond to rest and increased fluid intake. MAKE SURE YOU:  Understand these instructions.  Will watch your condition.  Will get help right away if you are not doing well or get worse.   This information is not intended to replace advice given to you by your health care provider. Make sure you discuss any questions you have with your health care provider.   Document Released: 03/06/2006 Document Revised: 07/22/2014 Document Reviewed: 08/16/2012 Elsevier Interactive Patient Education [employer]2016 [employer]

## 2015-06-21 NOTE — ED Provider Notes (Signed)
Patient discussed at length with then Cartner PA-C. Patient seen and evaluated independently.  Patient admitted to the hospital 3/24-3 27. Admitted with urinary retention. It was thought to perhaps be secondary to a new cholinesterase dementia medication. She had taken only one dosage of that. She has not restarted it. While hospitalized, she states she fell she had a "impaction". Patient is a retired Engineer, civil (consulting)nurse. She states she did not have a bowel movement the entire time. States she was told that she could take a laxative. She declined. Upon going home she used an enema and had production of large firm stool. Seen by Dr. Philippa SicksBuscemi, her GI physician. Start on MiraLAX a few days ago. Has had continued soft stools.  She has had continued difficulty with urinary retention and frequency and urinary pressure since that time.  She was able to void 300 mL here. Bladder scanning after voiding showed retention of 400 mL.  I have seen and examined her. She states upon catheter drainage her symptoms had completely resolved she has no discomfort feels "fine". I performed a rectal exam she has no fecal impaction. I recommended Foley catheter to remain in place release 7 days and have recommended urological clinic follow-up for him incontinence/retention clinic. This is very likely a bladder stretch injury from her original episode of retention.  We will check urine for sign of infection, and ensure baseline renal function before discharge. Patient will be discharged with a Foley catheter, and leg bag.  Ashley PorterMark Zyren Sevigny, MD 06/21/15 1444

## 2015-06-24 ENCOUNTER — Encounter: Payer: Self-pay | Admitting: Internal Medicine

## 2015-06-24 ENCOUNTER — Ambulatory Visit (INDEPENDENT_AMBULATORY_CARE_PROVIDER_SITE_OTHER): Payer: Medicare Other | Admitting: Internal Medicine

## 2015-06-24 VITALS — BP 124/60 | HR 62 | Temp 97.7°F | Ht 60.0 in | Wt 142.0 lb

## 2015-06-24 DIAGNOSIS — T887XXA Unspecified adverse effect of drug or medicament, initial encounter: Secondary | ICD-10-CM | POA: Diagnosis not present

## 2015-06-24 DIAGNOSIS — Z9289 Personal history of other medical treatment: Secondary | ICD-10-CM

## 2015-06-24 DIAGNOSIS — I10 Essential (primary) hypertension: Secondary | ICD-10-CM

## 2015-06-24 DIAGNOSIS — Z960 Presence of urogenital implants: Principal | ICD-10-CM

## 2015-06-24 DIAGNOSIS — F0390 Unspecified dementia without behavioral disturbance: Secondary | ICD-10-CM | POA: Diagnosis not present

## 2015-06-24 DIAGNOSIS — T50905A Adverse effect of unspecified drugs, medicaments and biological substances, initial encounter: Secondary | ICD-10-CM

## 2015-06-24 DIAGNOSIS — Z978 Presence of other specified devices: Secondary | ICD-10-CM

## 2015-06-24 NOTE — Progress Notes (Signed)
Patient ID: [name], female   DOB: Aug 04, 1927, 82 y.o.   MRN: [id number]   Location[facility]  [facility]  Provider: Murray Hodgkins, M.D.  Code Status: Full Goals of Care:  Advanced Directives 06/24/2015  Does patient have an advance directive? No  Would patient like information on creating an advanced directive? -     Chief Complaint  Patient presents with  . Medical Management of Chronic Issues    in ED 06/21/15 for abdominal pain, urinary retention    HPI: Patient is a 82 y.o. female seen today for medical management of chronic diseases.    Patient was last seen here 06/10/2015 with cellulitis of the toe of the left foot. This was locally debrided in the office. She also had a contusion in the right leg and attention was paid to her dementia. She was started on Namzeric at that time.  Patient was then seen in the emergency room 06/12/2015 with possible reaction from the [facility]. She was vomiting. She was admitted to observation and then discharged after she was doing better.  She was then seen in the emergency room 06/21/15 for abdominal pain. She reported that she hasn't had difficulty voiding for 48 hours prior to going to the emergency room, but urinated in the bedpan in the emergency department. Urinalysis was normal. Additional lab done during that emergency room checkup included a CBC with mild anemia. CMP showed glucose 136, BUN 38, and creatinine 1.29.  BUN and creatinine were lower than those obtained in the week preceding the ER visit. She had Foley catheter placed in the ER. It remains in place.     Past Medical History  Diagnosis Date  . Coronary artery disease     Remote PCI. Had BMS to RCA and DES stent to Diagonal in Jan. 2012   . Diastolic heart failure     EF 55 to 60% per cath in Jan 2012  . Hyperlipidemia   . Hypertension   . Carotid bruit   . Gout   . Obesity   . TIA (transient ischemic attack)   . Ischemic cardiomyopathy     with prior EF of 35%  . Anemia     . Proteinuria   . Carotid artery occlusion   . Stroke [facility]) 2013    ?  mini    . DVT (deep venous thrombosis) (HCC)   . NSTEMI (non-ST elevated myocardial infarction) [facility]) Jan 2012    with PCI to RCA and DX per Dr. Excell Seltzer  . Pneumonia 2016  . Type II diabetes mellitus (HCC)     long standing  . Chronic kidney disease (CKD), stage III (moderate)     Past Surgical History  Procedure Laterality Date  . Appendectomy    . Tonsillectomy    . Ankle fusion Bilateral   . Coronary angioplasty with stent placement  04/05/2010    distal RCA  . Fracture surgery Right 2005    bilateral ankles   . Abdominal aortagram N/A 01/17/2014    Procedure: ABDOMINAL AORTAGRAM;  Surgeon: Sherren Kerns, MD;  Location: [facility] CATH LAB;  Service: Cardiovascular;  Laterality: N/A;  . Cataract extraction, bilateral Bilateral     Allergies  Allergen Reactions  . Percocet [Oxycodone-Acetaminophen] Other (See Comments)    loosy goosy  . Sulfa Antibiotics Other (See Comments)    Tongue swells  . Adhesive [Tape] Other (See Comments)    Tears skin.  Please use "paper" tape  . Metformin And Related Other (See  Comments)    Abnormal Kidney Functions   . Tradjenta [Linagliptin]     Low back ache, resolved once medication stopped   . Penicillins Hives    Tolerates Ceftriaxone, Has patient had a PCN reaction causing immediate rash, facial/tongue/throat swelling, SOB or lightheadedness with hypotension: Unknown Has patient had a PCN reaction causing severe rash involving mucus membranes or skin necrosis: No Has patient had a PCN reaction that required hospitalization No Has patient had a PCN reaction occurring within the last 10 years: No If all of the above answers are "NO", then may proceed with Cephalosporin use.       Medication List       This list is accurate as of: 06/24/15  4:48 PM.  Always use your most recent med list.               amLODipine 5 MG tablet  Commonly known as:  NORVASC  Take 5  mg by mouth daily.     Capsaicin-Menthol-Methyl Sal 0.025-1-12 % Crea  Commonly known as:  capsaicin-methyl sal-menthol  Apply 1 application topically 2 (two) times daily.     carvedilol 3.125 MG tablet  Commonly known as:  COREG  One twice daily to control BP and strengthen the heart     cloNIDine 0.1 MG tablet  Commonly known as:  CATAPRES  Take 1 tablet (0.1 mg total) by mouth daily. For high blood pressure     ezetimibe-simvastatin 10-20 MG tablet  Commonly known as:  VYTORIN  1/2 tablet nightly to control cholesterol     furosemide 20 MG tablet  Commonly known as:  LASIX  Take 1 tablet (20 mg total) by mouth daily. For swelling     glucose blood test strip  E11.59 Check blood sugar daily as directed     losartan 50 MG tablet  Commonly known as:  COZAAR  Take 1 tablet (50 mg total) by mouth daily. For high blood pressure     LOTEMAX 0.5 % Gel  Generic drug:  Loteprednol Etabonate  One drop in the left eye twice daly to control glaucome     metFORMIN 500 MG tablet  Commonly known as:  GLUCOPHAGE  Take 500 mg by mouth daily.     nitroGLYCERIN 0.4 MG SL tablet  Commonly known as:  NITROSTAT  Dissolve one tablet under tongue as needed for chest pain. May repeat as needed every 5 minutes up to 3 doses. Seek medical attention if pain is not relieved after 3 doses     polyethylene glycol powder powder  Commonly known as:  GLYCOLAX/MIRALAX  DISSOLVE 17 GRAMS (1 CAPFUL) INTO 8 OUNCES OF LIQUID AND DRINK EVERY DAY AS NEEDED FOR BOWELS     PROLENSA 0.07 % Soln  Generic drug:  Bromfenac Sodium  Place 1 drop into the left eye every evening.     timolol 0.5 % ophthalmic solution  Commonly known as:  BETIMOL  One drop in the left eye daily to help control glaucoma        Review of Systems:  Review of Systems  Constitutional: Negative for fever, chills, diaphoresis, activity change, appetite change, fatigue and unexpected weight change.  HENT: Negative for congestion,  ear discharge, ear pain, hearing loss, postnasal drip, rhinorrhea, sore throat, tinnitus, trouble swallowing and voice change.   Eyes: Positive for visual disturbance (corrective lenses). Negative for pain, redness and itching.  Respiratory: Negative for cough, choking, shortness of breath and wheezing.   Cardiovascular: Positive for leg  swelling. Negative for chest pain and palpitations.  Gastrointestinal: Positive for abdominal pain and constipation. Negative for nausea, diarrhea, abdominal distention, anal bleeding and rectal pain.  Endocrine: Negative for cold intolerance, heat intolerance, polydipsia, polyphagia and polyuria.  Genitourinary: Negative for dysuria, urgency, frequency, hematuria, flank pain, vaginal discharge, difficulty urinating and pelvic pain.       Indwelling Foley catheter. Mass at rectum following straining CKD 3  Musculoskeletal: Positive for gait problem. Negative for myalgias, back pain, arthralgias, neck pain and neck stiffness.  Skin: Negative for color change, pallor, rash and wound.       Pain in the left great toe  Allergic/Immunologic: Negative.   Neurological: Negative for dizziness, tremors, seizures, syncope, weakness, light-headedness, numbness and headaches.  Hematological: Negative for adenopathy. Does not bruise/bleed easily.       Chronic Anemia  Psychiatric/Behavioral: Negative for suicidal ideas, hallucinations, behavioral problems, confusion, sleep disturbance, dysphoric mood and agitation. The patient is not nervous/anxious and is not hyperactive.     Health Maintenance  Topic Date Due  . FOOT EXAM  06/29/1939  . OPHTHALMOLOGY EXAM  06/29/1939  . TETANUS/TDAP  06/28/1948  . ZOSTAVAX  06/28/1989  . PNA vac Low Risk Adult (2 of 2 - PCV13) 06/29/1995  . INFLUENZA VACCINE  10/20/2015  . HEMOGLOBIN A1C  11/22/2015  . DEXA SCAN  Completed    Physical Exam: Filed Vitals:   06/24/15 1558  BP: 124/60  Pulse: 62  Temp: 97.7 F (36.5 C)    TempSrc: Oral  Height: 5' (1.524 m)  Weight: 142 lb (64.411 kg)  SpO2: 96%   Body mass index is 27.73 kg/(m^2). Physical Exam  Constitutional: She is oriented to person, place, and time. She appears well-developed and well-nourished. No distress.  Frail elderly female, NAD  HENT:  Head: Normocephalic and atraumatic.  Right Ear: External ear normal.  Left Ear: External ear normal.  Nose: Nose normal.  Mouth/Throat: Oropharynx is clear and moist. No oropharyngeal exudate.  Eyes: Conjunctivae and EOM are normal. Pupils are equal, round, and reactive to light. No scleral icterus.  Neck: Normal range of motion. Neck supple. No JVD present. No tracheal deviation present. No thyromegaly present.  Cardiovascular: Normal rate, regular rhythm, normal heart sounds and intact distal pulses.  Exam reveals no gallop and no friction rub.   No murmur heard. Pulmonary/Chest: Effort normal and breath sounds normal. No respiratory distress. She has no wheezes. She has no rales. She exhibits no tenderness.  Abdominal: Soft. Bowel sounds are normal. She exhibits no distension and no mass. There is no tenderness.  Genitourinary:  Loose anal sphincter. No palpable rectal mass. With straining, there is some mucosal eversion. Removed Foley catheter during office visit 06/24/15.  Musculoskeletal: Normal range of motion. She exhibits edema. She exhibits no tenderness.  Lymphadenopathy:    She has no cervical adenopathy.  Neurological: She is alert and oriented to person, place, and time. No cranial nerve deficit. Coordination normal.  Slow unstable gait 06/10/15 MMSE 20/30. Failed clock drawing.  Skin: Skin is warm and dry. No rash noted. She is not diaphoretic. No erythema. No pallor.  Ingrown nail of the left great toe.  Psychiatric: She has a normal mood and affect. Her behavior is normal. Judgment and thought content normal.    Labs reviewed: Basic Metabolic Panel:  Recent Labs  16/12/9601/24/17 1617  06/13/15 0515 06/15/15 0511 06/21/15 1426  NA  --  141 143 145  K  --  4.3 4.9 4.7  CL  --  110 114* 113*  CO2  --  21* 22 25  GLUCOSE  --  140* 133* 136*  BUN  --  69* 42* 38*  CREATININE  --  1.78* 1.48* 1.29*  CALCIUM  --  8.5* 8.6* 9.2  TSH 2.149  --   --   --    Liver Function Tests:  Recent Labs  06/08/15 1053 06/12/15 1355 06/21/15 1426  AST ALT ALKPHOS 69 65 59  BILITOT 0.3 0.5 0.3  PROT 5.4* 6.0* 6.3*  ALBUMIN 3.4* 3.3* 3.6    Recent Labs  06/12/15 1355 06/21/15 1426  LIPASE 44 24   No results for input(s): AMMONIA in the last 8760 hours. CBC:  Recent Labs  04/03/15 0310  06/08/15 1053 06/12/15 1355 06/13/15 0515 06/21/15 1427  WBC 4.2  < > 5.7 4.4 5.2 4.4  NEUTROABS 2.3  --  3.2  --   --  2.3  HGB 11.3*  < >  --  11.7* 10.4* 11.8*  HCT 35.1*  < > 36.7 34.8* 32.3* 35.3*  MCV 95.9  < > 92 90.9 90.0 88.9  PLT 135*  < > 201 133* 156 161  < > = values in this interval not displayed. Lipid Panel:  Recent Labs  10/21/14 1150 03/09/15 1129  CHOL 135 143  HDL 43.80 35*  LDLCALC 62 74  TRIG 148.0 170*  CHOLHDL 3 4.1   Lab Results  Component Value Date   HGBA1C 7.9* 05/22/2015    Procedures since last visit: Dg Chest 2 View  06/12/2015  CLINICAL DATA:  Headache and nausea up beginning this morning. Hypertension and diabetes. EXAM: CHEST  2 VIEW COMPARISON:  04/03/2015 and previous FINDINGS: The heart is mildly enlarged with left ventricular prominence. There is atherosclerosis of the aorta. Coronary stents are in place. There is linear scarring in the lingula. The lungs are otherwise well aerated. No effusions. Pulmonary vascularity is normal. No acute bone finding. IMPRESSION: Left ventricular prominence. Atherosclerosis. Coronary stents. No active pulmonary disease. Electronically Signed   By: Paulina Fusi M.D.   On: 06/12/2015 15:17   Ct Head Wo Contrast  06/12/2015  CLINICAL DATA:  Nausea and dizziness for 4 days EXAM: CT  HEAD WITHOUT CONTRAST TECHNIQUE: Contiguous axial images were obtained from the base of the skull through the vertex without intravenous contrast. COMPARISON:  Head CT September 05, 2009 and brain MRI September 05, 2009 FINDINGS: There is mild diffuse atrophy. There is no intracranial mass, hemorrhage, extra-axial fluid collection, or midline shift. There is slight small vessel disease in the centra semiovale bilaterally. Elsewhere, gray-white compartments appear normal. No acute infarct is evident. Bony calvarium appears intact. The mastoid air cells are clear. There is mild debris in each external auditory canal. IMPRESSION: Atrophy with slight periventricular small vessel disease. No intracranial mass, hemorrhage, or evidence of acute infarct. Probable cerumen in each external auditory canal. Electronically Signed   By: Bretta Bang III M.D.   On: 06/12/2015 15:25    Assessment/Plan  1. Foley catheter in place Removed during visit  2. Dementia, without behavioral disturbance Unchanged. Intolerant to Namzaric due to nausea and confusion  3. Adverse drug reaction, initial encounter Intolerant to Namzaric  4. Essential hypertension controlled   Next appt:  08/25/2015

## 2015-07-02 ENCOUNTER — Emergency Department (HOSPITAL_COMMUNITY): Payer: Medicare Other

## 2015-07-02 ENCOUNTER — Emergency Department (HOSPITAL_COMMUNITY)
Admission: EM | Admit: 2015-07-02 | Discharge: 2015-07-02 | Disposition: A | Discharge status: Home / Self Care | Payer: Medicare Other | Attending: Emergency Medicine | Admitting: Emergency Medicine

## 2015-07-02 ENCOUNTER — Encounter (HOSPITAL_COMMUNITY): Payer: Self-pay | Admitting: Emergency Medicine

## 2015-07-02 DIAGNOSIS — E669 Obesity, unspecified: Secondary | ICD-10-CM | POA: Diagnosis not present

## 2015-07-02 DIAGNOSIS — R0602 Shortness of breath: Secondary | ICD-10-CM | POA: Diagnosis not present

## 2015-07-02 DIAGNOSIS — I2510 Atherosclerotic heart disease of native coronary artery without angina pectoris: Secondary | ICD-10-CM | POA: Diagnosis not present

## 2015-07-02 DIAGNOSIS — E785 Hyperlipidemia, unspecified: Secondary | ICD-10-CM | POA: Diagnosis not present

## 2015-07-02 DIAGNOSIS — E119 Type 2 diabetes mellitus without complications: Secondary | ICD-10-CM | POA: Insufficient documentation

## 2015-07-02 DIAGNOSIS — I252 Old myocardial infarction: Secondary | ICD-10-CM | POA: Insufficient documentation

## 2015-07-02 DIAGNOSIS — N183 Chronic kidney disease, stage 3 (moderate): Secondary | ICD-10-CM | POA: Insufficient documentation

## 2015-07-02 DIAGNOSIS — Z862 Personal history of diseases of the blood and blood-forming organs and certain disorders involving the immune mechanism: Secondary | ICD-10-CM | POA: Diagnosis not present

## 2015-07-02 DIAGNOSIS — Z9861 Coronary angioplasty status: Secondary | ICD-10-CM | POA: Insufficient documentation

## 2015-07-02 DIAGNOSIS — I129 Hypertensive chronic kidney disease with stage 1 through stage 4 chronic kidney disease, or unspecified chronic kidney disease: Secondary | ICD-10-CM | POA: Insufficient documentation

## 2015-07-02 DIAGNOSIS — Z8701 Personal history of pneumonia (recurrent): Secondary | ICD-10-CM | POA: Insufficient documentation

## 2015-07-02 DIAGNOSIS — R0600 Dyspnea, unspecified: Secondary | ICD-10-CM | POA: Diagnosis present

## 2015-07-02 DIAGNOSIS — Z86718 Personal history of other venous thrombosis and embolism: Secondary | ICD-10-CM | POA: Diagnosis not present

## 2015-07-02 DIAGNOSIS — Z8739 Personal history of other diseases of the musculoskeletal system and connective tissue: Secondary | ICD-10-CM | POA: Diagnosis not present

## 2015-07-02 DIAGNOSIS — Z87891 Personal history of nicotine dependence: Secondary | ICD-10-CM | POA: Insufficient documentation

## 2015-07-02 DIAGNOSIS — Z8673 Personal history of transient ischemic attack (TIA), and cerebral infarction without residual deficits: Secondary | ICD-10-CM | POA: Insufficient documentation

## 2015-07-02 DIAGNOSIS — I5030 Unspecified diastolic (congestive) heart failure: Secondary | ICD-10-CM | POA: Insufficient documentation

## 2015-07-02 DIAGNOSIS — Z79899 Other long term (current) drug therapy: Secondary | ICD-10-CM | POA: Insufficient documentation

## 2015-07-02 DIAGNOSIS — Z7984 Long term (current) use of oral hypoglycemic drugs: Secondary | ICD-10-CM | POA: Insufficient documentation

## 2015-07-02 DIAGNOSIS — Z880 Allergy status to penicillin: Secondary | ICD-10-CM | POA: Insufficient documentation

## 2015-07-02 LAB — URINALYSIS, ROUTINE W REFLEX MICROSCOPIC
Bilirubin Urine: NEGATIVE
Glucose, UA: NEGATIVE mg/dL
Hgb urine dipstick: NEGATIVE
Ketones, ur: NEGATIVE mg/dL
Leukocytes, UA: NEGATIVE
Nitrite: NEGATIVE
Protein, ur: NEGATIVE mg/dL
Specific Gravity, Urine: 1.001 — ABNORMAL LOW (ref 1.005–1.030)
pH: 6 (ref 5.0–8.0)

## 2015-07-02 LAB — BASIC METABOLIC PANEL WITH GFR
Anion gap: 9 (ref 5–15)
BUN: 57 mg/dL — ABNORMAL HIGH (ref 6–20)
CO2: 23 mmol/L (ref 22–32)
Calcium: 9.1 mg/dL (ref 8.9–10.3)
Chloride: 112 mmol/L — ABNORMAL HIGH (ref 101–111)
Creatinine, Ser: 1.57 mg/dL — ABNORMAL HIGH (ref 0.44–1.00)
GFR calc Af Amer: 33 mL/min — ABNORMAL LOW
GFR calc non Af Amer: 29 mL/min — ABNORMAL LOW
Glucose, Bld: 123 mg/dL — ABNORMAL HIGH (ref 65–99)
Potassium: 4.6 mmol/L (ref 3.5–5.1)
Sodium: 144 mmol/L (ref 135–145)

## 2015-07-02 LAB — I-STAT TROPONIN, ED: Troponin i, poc: 0.02 ng/mL (ref 0.00–0.08)

## 2015-07-02 LAB — BRAIN NATRIURETIC PEPTIDE: B Natriuretic Peptide: 246.4 pg/mL — ABNORMAL HIGH (ref 0.0–100.0)

## 2015-07-02 MED ORDER — ASPIRIN 81 MG PO CHEW
324.0000 mg | CHEWABLE_TABLET | Freq: Once | ORAL | Status: AC
  Administered 2015-07-02: 324 mg via ORAL
  Filled 2015-07-02: qty 4

## 2015-07-02 MED ORDER — TECHNETIUM TO 99M ALBUMIN AGGREGATED: 4.0000 | Freq: Once | INTRAVENOUS | Status: DC | PRN

## 2015-07-02 MED ORDER — TECHNETIUM TC 99M DIETHYLENETRIAME-PENTAACETIC ACID: 29.0000 | Freq: Once | INTRAVENOUS | Status: DC | PRN

## 2015-07-02 NOTE — Discharge Instructions (Signed)
Shortness of Breath Shortness of breath means you have trouble breathing. It could also mean that you have a medical problem. You should get immediate medical care for shortness of breath. CAUSES   Not enough oxygen in the air such as with high altitudes or a smoke-filled room.  Certain lung diseases, infections, or problems.  Heart disease or conditions, such as angina or heart failure.  Low red blood cells (anemia).  Poor physical fitness, which can cause shortness of breath when you exercise.  Chest or back injuries or stiffness.  Being overweight.  Smoking.  Anxiety, which can make you feel like you are not getting enough air. DIAGNOSIS  Serious medical problems can often be found during your physical exam. Tests may also be done to determine why you are having shortness of breath. Tests may include:  Chest X-rays.  Lung function tests.  Blood tests.  An electrocardiogram (ECG).  An ambulatory electrocardiogram. An ambulatory ECG records your heartbeat patterns over a 24-hour period.  Exercise testing.  A transthoracic echocardiogram (TTE). During echocardiography, sound waves are used to evaluate how blood flows through your heart.  A transesophageal echocardiogram (TEE).  Imaging scans. Your health care provider may not be able to find a cause for your shortness of breath after your exam. In this case, it is important to have a follow-up exam with your health care provider as directed.  TREATMENT  Treatment for shortness of breath depends on the cause of your symptoms and can vary greatly. HOME CARE INSTRUCTIONS   Do not smoke. Smoking is a common cause of shortness of breath. If you smoke, ask for help to quit.  Avoid being around chemicals or things that may bother your breathing, such as paint fumes and dust.  Rest as needed. Slowly resume your usual activities.  If medicines were prescribed, take them as directed for the full length of time directed. This  includes oxygen and any inhaled medicines.  Keep all follow-up appointments as directed by your health care provider. SEEK MEDICAL CARE IF:   Your condition does not improve in the time expected.  You have a hard time doing your normal activities even with rest.  You have any new symptoms. SEEK IMMEDIATE MEDICAL CARE IF:   Your shortness of breath gets worse.  You feel light-headed, faint, or develop a cough not controlled with medicines.  You start coughing up blood.  You have pain with breathing.  You have chest pain or pain in your arms, shoulders, or abdomen.  You have a fever.  You are unable to walk up stairs or exercise the way you normally do. MAKE SURE YOU:  Understand these instructions.  Will watch your condition.  Will get help right away if you are not doing well or get worse.   This information is not intended to replace advice given to you by your health care provider. Make sure you discuss any questions you have with your health care provider.   Follow up with your primary care provider as soon as possible for re-evaluation. Return to the ED if you experience additional episodes of difficulty breathing, chest pain, dizziness, loss of consciousness, extremity weakness.

## 2015-07-02 NOTE — ED Notes (Signed)
Pt transported to [facility] 

## 2015-07-02 NOTE — ED Provider Notes (Signed)
Patient presented to the ER with shortest of breath. Patient had an episode of severe shortness of breath that occurred overnight, but this has spontaneously resolved.  Face to face Exam: HEENT - PERRLA Lungs - CTAB Heart - RRR, no M/R/G Abd - S/NT/ND Neuro - alert, oriented x3  Plan: Patient describes a feeling of shortness of breath, anxiety last night that could have been a panic attack, but must rule out cardiac disease and PE. EKG shows left bundle-branch block which is old. Will obtain troponin and imaging to rule out PE.  Gilda Creasehristopher J Gradie Ohm, MD 07/02/15 [phone]

## 2015-07-02 NOTE — ED Notes (Signed)
Pt ambulated to RR with minimal assistance 

## 2015-07-02 NOTE — ED Notes (Signed)
Pt presents to ED via EMS with c/o respiratory distress and dizzines, onset this morning around 4am. Pt was found hyperventilating per EMS. SpO2-98% on room air and 100% on 2L . Pt report urge to urinate but does not urinate. Pt was diagnosed with UTI x2 days and was placed on antibiotic. Per EMS Bp-146/82, HR-90, RR-14. Pt alerts and oriented x4 at this time.

## 2015-07-02 NOTE — ED Notes (Signed)
Pt is resting comfortably with family at bedside.  [place] EDP at bedside explaining to pt and family plan of care.  Pt and family verbally agrees.

## 2015-07-02 NOTE — ED Notes (Signed)
Bed: WA18 Expected date:  Expected time:  Means of arrival:  Comments: EMS 

## 2015-07-02 NOTE — ED Provider Notes (Signed)
CSN: [id number]     Arrival date & time 07/02/15  0543 History   First MD Initiated Contact with Patient 07/02/15 [phone]     Chief Complaint  Patient presents with  . Respiratory Distress     (Consider location/radiation/quality/duration/timing/severity/associated sxs/prior Treatment) HPI   [name] is an 82 y.o F with a pmhx of CAD, CHF, HLD, HTN, TIA, DVT, DM, CAD who presents the ED complaining of shortness of breath. Patient states she woke up in the middle of the night and was unable to breathe for several minutes. Patient states she was gasping for air and "it felt like I was going to die". She states that she was very dizzy and felt she was going to pass out. She also reports associated chest tightness. This episode lasted for several minutes and she called EMS. Upon arrival to the ED her symptoms have completely resolved. Patient reports bilateral lower extremity swelling or calf tenderness but states the symptoms have been going on for over a year. Patient was recently hospitalized for acute urinary retention and had a Foley catheter placed. She followed up with urology as an outpatient last week and had a Foley catheter removed. Patient reports that she has been able to urinate appropriately since this time. She denies fevers, chills, blurry vision, headache, syncope, weakness.  Past Medical History  Diagnosis Date  . Coronary artery disease     Remote PCI. Had BMS to RCA and DES stent to Diagonal in Jan. 2012   . Diastolic heart failure     EF 55 to 60% per cath in Jan 2012  . Hyperlipidemia   . Hypertension   . Carotid bruit   . Gout   . Obesity   . TIA (transient ischemic attack)   . Ischemic cardiomyopathy     with prior EF of 35%  . Anemia   . Proteinuria   . Carotid artery occlusion   . Stroke [facility]) 2013    ?  mini    . DVT (deep venous thrombosis) (HCC)   . NSTEMI (non-ST elevated myocardial infarction) [facility]) Jan 2012    with PCI to RCA and DX per Dr. Excell Seltzer   . Pneumonia 2016  . Type II diabetes mellitus (HCC)     long standing  . Chronic kidney disease (CKD), stage III (moderate)    Past Surgical History  Procedure Laterality Date  . Appendectomy    . Tonsillectomy    . Ankle fusion Bilateral   . Coronary angioplasty with stent placement  04/05/2010    distal RCA  . Fracture surgery Right 2005    bilateral ankles   . Abdominal aortagram N/A 01/17/2014    Procedure: ABDOMINAL AORTAGRAM;  Surgeon: Sherren Kerns, MD;  Location: [facility] CATH LAB;  Service: Cardiovascular;  Laterality: N/A;  . Cataract extraction, bilateral Bilateral    Family History  Problem Relation Age of Onset  . Heart disease Neg Hx     both parents were murdered   Social History  Substance Use Topics  . Smoking status: Former Games developer  . Smokeless tobacco: Never Used     Comment: "smoked in my teens; quit after 1 year or so"  . Alcohol Use: No   OB History    No data available     Review of Systems  All other systems reviewed and are negative.     Allergies  Namzaric; Percocet; Sulfa antibiotics; Adhesive; Metformin and related; Tradjenta; and Penicillins  Home Medications  Prior to Admission medications   Medication Sig Start Date End Date Taking? Authorizing Provider  amLODipine (NORVASC) 5 MG tablet Take 5 mg by mouth daily. 04/13/15   Historical Provider, MD  Capsaicin-Menthol-Methyl Sal (CAPSAICIN-METHYL SAL-MENTHOL) 0.025-1-12 % CREA Apply 1 application topically 2 (two) times daily. 03/13/15   Tharon AquasFrank C Patrick, PA  carvedilol (COREG) 3.125 MG tablet One twice daily to control BP and strengthen the heart 05/26/15   Kimber RelicArthur G Green, MD  cloNIDine (CATAPRES) 0.1 MG tablet Take 1 tablet (0.1 mg total) by mouth daily. For high blood pressure 09/04/14   Sharon SellerJessica K Eubanks, NP  ezetimibe-simvastatin (VYTORIN) 10-20 MG tablet 1/2 tablet nightly to control cholesterol 05/26/15   Kimber RelicArthur G Green, MD  furosemide (LASIX) 20 MG tablet Take 1 tablet (20 mg total) by  mouth daily. For swelling 09/04/14   Sharon SellerJessica K Eubanks, NP  glucose blood test strip E11.59 Check blood sugar daily as directed 08/07/14   Sharon SellerJessica K Eubanks, NP  losartan (COZAAR) 50 MG tablet Take 1 tablet (50 mg total) by mouth daily. For high blood pressure 09/04/14   Sharon SellerJessica K Eubanks, NP  LOTEMAX 0.5 % GEL One drop in the left eye twice daly to control glaucome 05/26/15   Kimber RelicArthur G Green, MD  metFORMIN (GLUCOPHAGE) 500 MG tablet Take 500 mg by mouth daily. 06/10/15   Historical Provider, MD  nitroGLYCERIN (NITROSTAT) 0.4 MG SL tablet Dissolve one tablet under tongue as needed for chest pain. May repeat as needed every 5 minutes up to 3 doses. Seek medical attention if pain is not relieved after 3 doses 10/27/14   Sharon SellerJessica K Eubanks, NP  polyethylene glycol powder (GLYCOLAX/MIRALAX) powder DISSOLVE 17 GRAMS (1 CAPFUL) INTO 8 OUNCES OF LIQUID AND DRINK EVERY DAY AS NEEDED FOR BOWELS 05/18/15   Kimber RelicArthur G Green, MD  PROLENSA 0.07 % SOLN Place 1 drop into the left eye every evening. 05/12/15   Historical Provider, MD  timolol (BETIMOL) 0.5 % ophthalmic solution One drop in the left eye daily to help control glaucoma 05/26/15   Kimber RelicArthur G Green, MD   BP 181/72 mmHg  Pulse 56  Temp(Src) 97.6 F (36.4 C) (Oral)  Resp 17  SpO2 100% Physical Exam  Constitutional: She is oriented to person, place, and time. She appears well-developed and well-nourished. No distress.  HENT:  Head: Normocephalic and atraumatic.  Mouth/Throat: No oropharyngeal exudate.  Eyes: Conjunctivae and EOM are normal. Pupils are equal, round, and reactive to light. Right eye exhibits no discharge. Left eye exhibits no discharge. No scleral icterus.  Neck: Neck supple. No JVD present.  Cardiovascular: Normal rate, regular rhythm, normal heart sounds and intact distal pulses.  Exam reveals no gallop and no friction rub.   No murmur heard. Pulmonary/Chest: Effort normal and breath sounds normal. No respiratory distress. She has no wheezes. She  has no rales. She exhibits no tenderness.  Abdominal: Soft. Bowel sounds are normal. She exhibits no distension and no mass. There is no tenderness. There is no rebound and no guarding.  Musculoskeletal: Normal range of motion. She exhibits no edema.  Neurological: She is alert and oriented to person, place, and time. No cranial nerve deficit.  Strength 5/5 throughout. No sensory deficits.    Skin: Skin is warm and dry. No rash noted. She is not diaphoretic. No erythema. No pallor.  Psychiatric: She has a normal mood and affect. Her behavior is normal.  Nursing note and vitals reviewed.   ED Course  Procedures (including critical care  time) Labs Review Labs Reviewed  BASIC METABOLIC PANEL - Abnormal; Notable for the following:    Chloride 112 (*)    Glucose, Bld 123 (*)    BUN 57 (*)    Creatinine, Ser 1.57 (*)    GFR calc non Af Amer 29 (*)    GFR calc Af Amer 33 (*)    All other components within normal limits  BRAIN NATRIURETIC PEPTIDE - Abnormal; Notable for the following:    B Natriuretic Peptide 246.4 (*)    All other components within normal limits  URINALYSIS, ROUTINE W REFLEX MICROSCOPIC (NOT AT [facility]) - Abnormal; Notable for the following:    Specific Gravity, Urine 1.001 (*)    All other components within normal limits  I-STAT TROPOININ, ED    Imaging Review Dg Chest 2 View  07/02/2015  CLINICAL DATA:  Awoke this morning with acute dyspnea and lightheadedness. EXAM: CHEST  2 VIEW COMPARISON:  06/12/2015 FINDINGS: There is mild cardiomegaly. Lungs are clear. There is no pleural effusion. Pulmonary vasculature is normal hilar and mediastinal contours are unremarkable and unchanged. IMPRESSION: Mild cardiomegaly.  No acute cardiopulmonary findings. Electronically Signed   By: Ellery Plunk M.D.   On: 07/02/2015 06:58   Nm Pulmonary Perf And Vent  07/02/2015  CLINICAL DATA:  Developed shortness of breath this morning, history of DVT EXAM: NUCLEAR MEDICINE VENTILATION -  PERFUSION LUNG SCAN TECHNIQUE: Ventilation images were obtained in multiple projections using inhaled aerosol Tc-72m DTPA. Perfusion images were obtained in multiple projections after intravenous injection of Tc-59m MAA. RADIOPHARMACEUTICALS:  29 mCi Technetium-38m DTPA aerosol inhalation and 4.0 mCi Technetium-5m MAA IV COMPARISON:  None Radiographic correlation:  07/02/2015 chest radiograph FINDINGS: Ventilation: Central airway deposition of tracer. Swallowed tracer in stomach. Subsegmental ventilation defect LEFT lower lobe. Enlargement of cardiac silhouette. Perfusion: Matching subsegmental diminished perfusion LEFT lower lobe. Remaining perfusion normal. Enlargement of cardiac silhouette. Chest radiograph: Mild enlargement of cardiac silhouette. Emphysematous changes with subsegmental atelectasis LEFT base. IMPRESSION: Low probability for pulmonary embolism. Electronically Signed   By: Ulyses Southward M.D.   On: 07/02/2015 14:35   I have personally reviewed and evaluated these images and lab results as part of my medical decision-making.   EKG Interpretation   Date/Time:  Thursday July 02 2015 05:55:50 EDT Ventricular Rate:  58 PR Interval:  159 QRS Duration: 131 QT Interval:  441 QTC Calculation: 433 R Axis:   -67 Text Interpretation:  Sinus rhythm Left bundle branch block No significant  change since last tracing Confirmed by POLLINA  MD, CHRISTOPHER [phone]) on  07/02/2015 7:49:50 AM      MDM   Final diagnoses:  SOB (shortness of breath)   82 y.o F presents to the ED for reported episode of shortness of breath that occurred early this morning that woke her from sleep. On presentation the ED, patient states her symptoms have completely resolved. She is not hypoxic or tachycardic. Patient was recently hospitalized due to a urinary tract infection which she states that since resolved. Patient has history of CAD, DVT, HTN and HLD. Given recent hospitalization and previous DVT concern for  pulmonary embolism. EKG unchanged from previous. Patient never did have chest pain. Troponin 0.02. Less likely ACS. BNP mildly elevated at 246. Doubt CHF as patient's symptoms have resolved. No pulmonary edema seen on chest x-ray. CT PE study unable to be performed as patient's creatinine is elevated to 1.57 at her baseline. We'll obtain VQ study.  VQ scan negative for pulmonary embolism.  There is mild atelectasis in the left lower lobe. No sign of infection. Patient is afebrile. No leukocytosis. Patient's symptoms have completely resolved and she has not had any further episodes while in the ER. Patient has remained hemodynamically stable while in the ER and was observed for greater than 5 hours.. Felt she is safe for discharge with PCP follow-up. Discussed treatment plan with patient and family who are agreeable. Return precautions outlined in patient discharge instructions.  Patient was discussed with and seen by Dr. Blinda Leatherwood who agrees with the treatment plan.     Lester Kinsman Progress, PA-C 07/03/15 1901  Gilda Crease, MD 07/15/15 2325

## 2015-07-02 NOTE — ED Notes (Signed)
Patient does not want to be stuck for labs.

## 2015-07-08 ENCOUNTER — Encounter: Payer: Self-pay | Admitting: Internal Medicine

## 2015-07-08 ENCOUNTER — Ambulatory Visit (INDEPENDENT_AMBULATORY_CARE_PROVIDER_SITE_OTHER): Payer: Medicare Other | Admitting: Internal Medicine

## 2015-07-08 VITALS — BP 150/72 | HR 80 | Temp 97.7°F | Ht 60.0 in | Wt 141.0 lb

## 2015-07-08 DIAGNOSIS — R0600 Dyspnea, unspecified: Secondary | ICD-10-CM | POA: Insufficient documentation

## 2015-07-08 DIAGNOSIS — I10 Essential (primary) hypertension: Secondary | ICD-10-CM

## 2015-07-08 NOTE — Progress Notes (Signed)
/Patient ID: [name], female   DOB: 08-12-1927, 82 y.o.   MRN: [id number]    Facility  [place]    Place of Service:   OFFICE    Allergies  Allergen Reactions  . Namzaric [Memantine Hcl-Donepezil Hcl] Nausea Only    confusion  . Percocet [Oxycodone-Acetaminophen] Other (See Comments)    loosy goosy  . Sulfa Antibiotics Other (See Comments)    Tongue swells  . Adhesive [Tape] Other (See Comments)    Tears skin.  Please use "paper" tape  . Metformin And Related Other (See Comments)    Abnormal Kidney Functions   . Tradjenta [Linagliptin]     Low back ache, resolved once medication stopped   . Penicillins Hives    Tolerates Ceftriaxone, Has patient had a PCN reaction causing immediate rash, facial/tongue/throat swelling, SOB or lightheadedness with hypotension: Unknown Has patient had a PCN reaction causing severe rash involving mucus membranes or skin necrosis: No Has patient had a PCN reaction that required hospitalization No Has patient had a PCN reaction occurring within the last 10 years: No If all of the above answers are "NO", then may proceed with Cephalosporin use.     Chief Complaint  Patient presents with  . Hospitalization Follow-up    06/12/15 to 06/15/15 nausea, vomiting and dizziness. Then 07/02/15 ER for shortness of breath    HPI:  Patient seen here 06/24/15 following her hospitalization.  Patient was lseen here 06/10/2015 with cellulitis of the toe of the left foot. This was locally debrided in the office. She also had a contusion in the right leg and attention was paid to her dementia. She was started on Namzeric at that time.  Patient was then seen in the emergency room 06/12/2015 with possible reaction from the [facility]. She was vomiting. She was admitted to observation and then discharged after she was doing better.  She was then seen in the emergency room 06/21/15 for abdominal pain. She reported that she hasn't had difficulty voiding for 48 hours prior to  going to the emergency room, but urinated in the bedpan in the emergency department. Urinalysis was normal. Additional lab done during that emergency room checkup included a CBC with mild anemia. CMP showed glucose 136, BUN 38, and creatinine 1.29. BUN and creatinine were lower than those obtained in the week preceding the ER visit. She had Foley catheter placed in the ER. It remained in place and was removed at the time of her last office visit 06/24/2015.  She had to go back to see the urologist and have her bladder drained once. She was placed on Cipro for a week. She thinks that problem is resolved now.  Patient returned to the emergency room 07/02/2015 complaints of dyspnea. She woke up in the middle of the night was unable to breathe for several minutes. She was gasping for air and felt like she was going to die. She also reported dizziness and chest tightness. Lab work showed some renal insufficiency with elevated BUN and creatinine. VQ scan seem to rule out pulmonary embolism .While in the emergency room, her complaints of dyspnea completely resolved. She was discharged back to home.   She says she is feeling the best that she has in along time.   Medications: Patient's Medications  New Prescriptions   No medications on file  Previous Medications   AMLODIPINE (NORVASC) 5 MG TABLET    Take 5 mg by mouth daily.   CAPSAICIN-MENTHOL-METHYL SAL (CAPSAICIN-METHYL SAL-MENTHOL) 0.025-1-12 % CREA  Apply 1 application topically 2 (two) times daily.   CARVEDILOL (COREG) 3.125 MG TABLET    One twice daily to control BP and strengthen the heart   CLONIDINE (CATAPRES) 0.1 MG TABLET    Take 1 tablet (0.1 mg total) by mouth daily. For high blood pressure   EZETIMIBE-SIMVASTATIN (VYTORIN) 10-20 MG TABLET    1/2 tablet nightly to control cholesterol   FUROSEMIDE (LASIX) 20 MG TABLET    Take 1 tablet (20 mg total) by mouth daily. For swelling   GLUCOSE BLOOD TEST STRIP    E11.59 Check blood sugar daily as  directed   LOSARTAN (COZAAR) 50 MG TABLET    Take 1 tablet (50 mg total) by mouth daily. For high blood pressure   LOTEMAX 0.5 % GEL    One drop in the left eye twice daly to control glaucome   METFORMIN (GLUCOPHAGE) 500 MG TABLET    Take 500 mg by mouth daily.   NITROGLYCERIN (NITROSTAT) 0.4 MG SL TABLET    Dissolve one tablet under tongue as needed for chest pain. May repeat as needed every 5 minutes up to 3 doses. Seek medical attention if pain is not relieved after 3 doses   POLYETHYLENE GLYCOL POWDER (GLYCOLAX/MIRALAX) POWDER    DISSOLVE 17 GRAMS (1 CAPFUL) INTO 8 OUNCES OF LIQUID AND DRINK EVERY DAY AS NEEDED FOR BOWELS   TIMOLOL (BETIMOL) 0.5 % OPHTHALMIC SOLUTION    One drop in the left eye daily to help control glaucoma  Modified Medications   No medications on file  Discontinued Medications   CIPROFLOXACIN (CIPRO) 250 MG TABLET    Take 250 mg by mouth 2 (two) times daily.    Review of Systems  Constitutional: Negative for fever, chills, diaphoresis, activity change, appetite change, fatigue and unexpected weight change.  HENT: Negative for congestion, ear discharge, ear pain, hearing loss, postnasal drip, rhinorrhea, sore throat, tinnitus, trouble swallowing and voice change.   Eyes: Positive for visual disturbance (corrective lenses). Negative for pain, redness and itching.  Respiratory: Negative for cough, choking, shortness of breath and wheezing.   Cardiovascular: Positive for leg swelling. Negative for chest pain and palpitations.  Gastrointestinal: Positive for abdominal pain and constipation. Negative for nausea, diarrhea, abdominal distention, anal bleeding and rectal pain.  Endocrine: Negative for cold intolerance, heat intolerance, polydipsia, polyphagia and polyuria.  Genitourinary: Negative for dysuria, urgency, frequency, hematuria, flank pain, vaginal discharge, difficulty urinating and pelvic pain.       Indwelling Foley catheter. Mass at rectum following  straining CKD 3  Musculoskeletal: Positive for gait problem. Negative for myalgias, back pain, arthralgias, neck pain and neck stiffness.  Skin: Negative for color change, pallor, rash and wound.       Pain in the left great toe  Allergic/Immunologic: Negative.   Neurological: Negative for dizziness, tremors, seizures, syncope, weakness, light-headedness, numbness and headaches.  Hematological: Negative for adenopathy. Does not bruise/bleed easily.       Chronic Anemia  Psychiatric/Behavioral: Negative for suicidal ideas, hallucinations, behavioral problems, confusion, sleep disturbance, dysphoric mood and agitation. The patient is not nervous/anxious and is not hyperactive.     Filed Vitals:   07/08/15 1542  BP: 150/72  Pulse: 80  Temp: 97.7 F (36.5 C)  Height: 5' (1.524 m)  Weight: 141 lb (63.957 kg)  SpO2: 98%   Body mass index is 27.54 kg/(m^2). Filed Weights   07/08/15 1542  Weight: 141 lb (63.957 kg)     Physical Exam  Constitutional: She is  oriented to person, place, and time. She appears well-developed and well-nourished. No distress.  Frail elderly female, NAD  HENT:  Head: Normocephalic and atraumatic.  Right Ear: External ear normal.  Left Ear: External ear normal.  Nose: Nose normal.  Mouth/Throat: Oropharynx is clear and moist. No oropharyngeal exudate.  Eyes: Conjunctivae and EOM are normal. Pupils are equal, round, and reactive to light. No scleral icterus.  Neck: Normal range of motion. Neck supple. No JVD present. No tracheal deviation present. No thyromegaly present.  Cardiovascular: Normal rate, regular rhythm, normal heart sounds and intact distal pulses.  Exam reveals no gallop and no friction rub.   No murmur heard. Pulmonary/Chest: Effort normal and breath sounds normal. No respiratory distress. She has no wheezes. She has no rales. She exhibits no tenderness.  Abdominal: Soft. Bowel sounds are normal. She exhibits no distension and no mass. There is  no tenderness.  Genitourinary:  Loose anal sphincter. No palpable rectal mass. With straining, there is some mucosal eversion. Removed Foley catheter during office visit 06/24/15.  Musculoskeletal: Normal range of motion. She exhibits edema. She exhibits no tenderness.  Lymphadenopathy:    She has no cervical adenopathy.  Neurological: She is alert and oriented to person, place, and time. No cranial nerve deficit. Coordination normal.  Slow unstable gait 06/10/15 MMSE 20/30. Failed clock drawing.  Skin: Skin is warm and dry. No rash noted. She is not diaphoretic. No erythema. No pallor.  Ingrown nail of the left great toe.  Psychiatric: She has a normal mood and affect. Her behavior is normal. Judgment and thought content normal.    Labs reviewed: Lab Summary Latest Ref Rng 07/02/2015 06/21/2015 06/15/2015 06/13/2015 06/12/2015  Hemoglobin 12.0 - 15.0 g/dL (None) 11.8(L) (None) 10.4(L) 11.7(L)  Hematocrit 36.0 - 46.0 % (None) 35.3(L) (None) 32.3(L) 34.8(L)  White count 4.0 - 10.5 K/uL (None) 4.4 (None) 5.2 4.4  Platelet count 150 - 400 K/uL (None) 161 (None) 156 133(L)  Sodium 135 - 145 mmol/L 144 145 143 141 138  Potassium 3.5 - 5.1 mmol/L 4.6 4.7 4.9 4.3 4.4  Calcium 8.9 - 10.3 mg/dL 9.1 9.2 8.6(L) 8.5(L) 8.7(L)  Phosphorus - (None) (None) (None) (None) (None)  Creatinine 0.44 - 1.00 mg/dL 1.57(H) 1.29(H) 1.48(H) 1.78(H) 1.56(H)  AST 15 - 41 U/L (None) 22 (None) (None) 24  Alk Phos 38 - 126 U/L (None) 59 (None) (None) 65  Bilirubin 0.3 - 1.2 mg/dL (None) 0.3 (None) (None) 0.5  Glucose 65 - 99 mg/dL 123(H) 136(H) 133(H) 140(H) 247(H)  Cholesterol - (None) (None) (None) (None) (None)  HDL cholesterol - (None) (None) (None) (None) (None)  Triglycerides - (None) (None) (None) (None) (None)  LDL Direct - (None) (None) (None) (None) (None)  LDL Calc - (None) (None) (None) (None) (None)  Total protein 6.5 - 8.1 g/dL (None) 6.3(L) (None) (None) 6.0(L)  Albumin 3.5 - 5.0 g/dL (None) 3.6 (None)  (None) 3.3(L)   Lab Results  Component Value Date   TSH 2.149 06/12/2015   TSH 4.25 05/01/2014   TSH 5.43* 04/02/2014   Lab Results  Component Value Date   BUN 57* 07/02/2015   BUN 38* 06/21/2015   BUN 42* 06/15/2015   Lab Results  Component Value Date   HGBA1C 7.9* 05/22/2015   HGBA1C 7.5* 04/02/2015   HGBA1C 7.8* 03/09/2015    Assessment/Plan  1. Dyspnea resolved  2. Essential hypertension Adequate;ly controlled

## 2015-07-14 ENCOUNTER — Encounter: Payer: Self-pay | Admitting: Podiatry

## 2015-07-14 ENCOUNTER — Ambulatory Visit (INDEPENDENT_AMBULATORY_CARE_PROVIDER_SITE_OTHER): Payer: Medicare Other | Admitting: Sports Medicine

## 2015-07-14 DIAGNOSIS — M79676 Pain in unspecified toe(s): Secondary | ICD-10-CM | POA: Diagnosis not present

## 2015-07-14 DIAGNOSIS — E119 Type 2 diabetes mellitus without complications: Secondary | ICD-10-CM

## 2015-07-14 DIAGNOSIS — B351 Tinea unguium: Secondary | ICD-10-CM

## 2015-07-14 NOTE — Progress Notes (Signed)
Patient ID: [name], female   DOB: 30-Dec-1927, 82 y.o.   MRN: [id number] Subjective: [name] is a 82 y.o. female patient with history of diabetes who presents to office today complaining of long, painful nails  while ambulating in shoes; unable to trim. Patient states that the glucose reading this morning was not recorded; states that it is good. Patient denies any new changes in medication or new problems. Patient denies any new cramping, numbness, burning or tingling in the legs.  Patient Active Problem List   Diagnosis Date Noted  . Dyspnea 07/08/2015  . Foley catheter in place 06/24/2015  . Adverse drug reaction 06/15/2015  . Nausea & vomiting 06/12/2015  . Dizziness 06/12/2015  . Urinary retention 06/12/2015  . Contusion of right leg 06/10/2015  . Dementia 06/10/2015  . Cellulitis of toe 05/26/2015  . Expressive aphasia 05/26/2015  . Influenza A 04/03/2015  . Anal prolapse 02/25/2015  . Anemia 02/24/2015  . Urinary tract infection 06/03/2014  . Ulcer, venous stasis ([place]) 04/10/2014  . Chronic kidney disease, stage III (moderate) 02/10/2014  . PAD (peripheral artery disease) ([place]) 01/17/2014  . Constipation 07/31/2013  . Atherosclerosis of native arteries of the extremities with intermittent claudication-Right Leg 07/04/2013  . Anxiety disorder 02/21/2013  . Diabetes mellitus with nephropathy ([place]) 07/05/2012  . CAD (coronary artery disease) of artery bypass graft 07/05/2012  . Pain in limb 06/21/2012  . Occlusion and stenosis of carotid artery without mention of cerebral infarction 06/21/2012  . Coronary artery disease   . Chronic diastolic heart failure, NYHA class 1 ([place])   . Hyperlipidemia   . Hypertension   . Carotid bruit   . Gout   . Obesity   . TIA (transient ischemic attack)    Current Outpatient Prescriptions on File Prior to Visit  Medication Sig Dispense Refill  . amLODipine (NORVASC) 5 MG tablet Take 5 mg by mouth daily.    .  Capsaicin-Menthol-Methyl Sal (CAPSAICIN-METHYL SAL-MENTHOL) 0.025-1-12 % CREA Apply 1 application topically 2 (two) times daily. (Patient taking differently: Apply 1 application topically 2 (two) times daily as needed (foot pain). ) 60 g 2  . carvedilol (COREG) 3.125 MG tablet One twice daily to control BP and strengthen the heart 60 tablet 5  . cloNIDine (CATAPRES) 0.1 MG tablet Take 1 tablet (0.1 mg total) by mouth daily. For high blood pressure 30 tablet 4  . ezetimibe-simvastatin (VYTORIN) 10-20 MG tablet 1/2 tablet nightly to control cholesterol 45 tablet 4  . furosemide (LASIX) 20 MG tablet Take 1 tablet (20 mg total) by mouth daily. For swelling (Patient not taking: Reported on 07/08/2015) 30 tablet 2  . glucose blood test strip E11.59 Check blood sugar daily as directed 100 each 3  . losartan (COZAAR) 50 MG tablet Take 1 tablet (50 mg total) by mouth daily. For high blood pressure 90 tablet 2  . LOTEMAX 0.5 % GEL One drop in the left eye twice daly to control glaucome 5 g 2  . metFORMIN (GLUCOPHAGE) 500 MG tablet Take 500 mg by mouth daily.    . nitroGLYCERIN (NITROSTAT) 0.4 MG SL tablet Dissolve one tablet under tongue as needed for chest pain. May repeat as needed every 5 minutes up to 3 doses. Seek medical attention if pain is not relieved after 3 doses 25 tablet 11  . polyethylene glycol powder (GLYCOLAX/MIRALAX) powder DISSOLVE 17 GRAMS (1 CAPFUL) INTO 8 OUNCES OF LIQUID AND DRINK EVERY DAY AS NEEDED FOR BOWELS 850 g 5  .  timolol (BETIMOL) 0.5 % ophthalmic solution One drop in the left eye daily to help control glaucoma (Patient taking differently: Place 1 drop into the left eye 2 (two) times daily. ) 10 mL 2   No current facility-administered medications on file prior to visit.   Allergies  Allergen Reactions  . Namzaric [Memantine Hcl-Donepezil Hcl] Nausea Only    confusion  . Percocet [Oxycodone-Acetaminophen] Other (See Comments)    loosy goosy  . Sulfa Antibiotics Other (See  Comments)    Tongue swells  . Adhesive [Tape] Other (See Comments)    Tears skin.  Please use "paper" tape  . Metformin And Related Other (See Comments)    Abnormal Kidney Functions   . Tradjenta [Linagliptin]     Low back ache, resolved once medication stopped   . Penicillins Hives    Tolerates Ceftriaxone, Has patient had a PCN reaction causing immediate rash, facial/tongue/throat swelling, SOB or lightheadedness with hypotension: Unknown Has patient had a PCN reaction causing severe rash involving mucus membranes or skin necrosis: No Has patient had a PCN reaction that required hospitalization No Has patient had a PCN reaction occurring within the last 10 years: No If all of the above answers are "NO", then may proceed with Cephalosporin use.     Recent Results (from the past 2160 hour(s))  Hemoglobin A1c     Status: Abnormal   Collection Time: 05/22/15  9:51 AM  Result Value Ref Range   Hgb A1c MFr Bld 7.9 (H) 4.8 - 5.6 %    Comment:          Pre-diabetes: 5.7 - 6.4          Diabetes: >6.4          Glycemic control for adults with diabetes: <7.0    Est. average glucose Bld gHb Est-mCnc 180 mg/dL  Comprehensive metabolic panel     Status: Abnormal   Collection Time: 05/22/15  9:51 AM  Result Value Ref Range   Glucose 178 (H) 65 - 99 mg/dL   BUN 44 (H) 8 - 27 mg/dL   Creatinine, Ser 1.52 (H) 0.57 - 1.00 mg/dL   GFR calc non Af Amer 31 (L) >59 mL/min/1.73   GFR calc Af Amer 36 (L) >59 mL/min/1.73   BUN/Creatinine Ratio 29 (H) 11 - 26   Sodium 145 (H) 134 - 144 mmol/L   Potassium 5.1 3.5 - 5.2 mmol/L   Chloride 107 (H) 96 - 106 mmol/L   CO2 22 18 - 29 mmol/L   Calcium 9.0 8.7 - 10.3 mg/dL   Total Protein 5.9 (L) 6.0 - 8.5 g/dL   Albumin 3.9 3.5 - 4.7 g/dL   Globulin, Total 2.0 1.5 - 4.5 g/dL   Albumin/Globulin Ratio 2.0 1.1 - 2.5    Comment: **Effective June 01, 2015 the reference interval**   for A/G Ratio will be changing to:              Age                Female           Female           0 -  7 days       1.1 - 2.3       1.1 - 2.3           8 - 30 days       1.2 - 2.8       1.2 - 2.8  1 -  6 months     1.3 - 3.6       1.3 - 3.6    7 months -  5 years      1.5 - 2.6       1.5 - 2.6              > 5 years      1.2 - 2.2       1.2 - 2.2    Bilirubin Total 0.3 0.0 - 1.2 mg/dL   Alkaline Phosphatase 60 39 - 117 IU/L   AST 11 0 - 40 IU/L   ALT 7 0 - 32 IU/L  Microalbumin, urine     Status: None   Collection Time: 05/22/15  9:51 AM  Result Value Ref Range   Microalbum.,U,Random 215.0 Not Estab. ug/mL  CBC with Differential     Status: None   Collection Time: 06/08/15 10:53 AM  Result Value Ref Range   WBC 5.7 3.4 - 10.8 x10E3/uL   RBC 3.99 3.77 - 5.28 x10E6/uL   Hemoglobin 11.8 11.1 - 15.9 g/dL   Hematocrit 36.7 34.0 - 46.6 %   MCV 92 79 - 97 fL   MCH 29.6 26.6 - 33.0 pg   MCHC 32.2 31.5 - 35.7 g/dL   RDW 14.0 12.3 - 15.4 %   Platelets 201 150 - 379 x10E3/uL   Neutrophils 56 %   Lymphs 36 %   Monocytes 7 %   Eos 1 %   Basos 0 %   Neutrophils Absolute 3.2 1.4 - 7.0 x10E3/uL   Lymphocytes Absolute 2.0 0.7 - 3.1 x10E3/uL   Monocytes Absolute 0.4 0.1 - 0.9 x10E3/uL   EOS (ABSOLUTE) 0.1 0.0 - 0.4 x10E3/uL   Basophils Absolute 0.0 0.0 - 0.2 x10E3/uL   Immature Granulocytes 0 %   Immature Grans (Abs) 0.0 0.0 - 0.1 x10E3/uL  CMP     Status: Abnormal   Collection Time: 06/08/15 10:53 AM  Result Value Ref Range   Glucose 187 (H) 65 - 99 mg/dL   BUN 67 (H) 8 - 27 mg/dL   Creatinine, Ser 1.89 (H) 0.57 - 1.00 mg/dL   GFR calc non Af Amer 24 (L) >59 mL/min/1.73   GFR calc Af Amer 27 (L) >59 mL/min/1.73   BUN/Creatinine Ratio 35 (H) 11 - 26   Sodium 141 134 - 144 mmol/L   Potassium 5.0 3.5 - 5.2 mmol/L   Chloride 103 96 - 106 mmol/L   CO2 20 18 - 29 mmol/L   Calcium 8.9 8.7 - 10.3 mg/dL   Total Protein 5.4 (L) 6.0 - 8.5 g/dL   Albumin 3.4 (L) 3.5 - 4.7 g/dL   Globulin, Total 2.0 1.5 - 4.5 g/dL   Albumin/Globulin Ratio 1.7 1.2 - 2.2     Comment:               **Please note reference interval change**   Bilirubin Total 0.3 0.0 - 1.2 mg/dL   Alkaline Phosphatase 69 39 - 117 IU/L   AST 14 0 - 40 IU/L   ALT 9 0 - 32 IU/L  Blood culture (routine x 2)     Status: None   Collection Time: 06/12/15  1:40 PM  Result Value Ref Range   Specimen Description BLOOD LEFT HAND    Special Requests IN PEDIATRIC BOTTLE 3CC    Culture NO GROWTH 5 DAYS    Report Status 06/17/2015 FINAL   Blood culture (routine x 2)  Status: None   Collection Time: 06/12/15  1:51 PM  Result Value Ref Range   Specimen Description BLOOD RIGHT HAND    Special Requests IN PEDIATRIC BOTTLE 1CC    Culture NO GROWTH 5 DAYS    Report Status 06/17/2015 FINAL   CBC     Status: Abnormal   Collection Time: 06/12/15  1:55 PM  Result Value Ref Range   WBC 4.4 4.0 - 10.5 K/uL   RBC 3.83 (L) 3.87 - 5.11 MIL/uL   Hemoglobin 11.7 (L) 12.0 - 15.0 g/dL   HCT 34.8 (L) 36.0 - 46.0 %   MCV 90.9 78.0 - 100.0 fL   MCH 30.5 26.0 - 34.0 pg   MCHC 33.6 30.0 - 36.0 g/dL   RDW 13.3 11.5 - 15.5 %   Platelets 133 (L) 150 - 400 K/uL  Comprehensive metabolic panel     Status: Abnormal   Collection Time: 06/12/15  1:55 PM  Result Value Ref Range   Sodium 138 135 - 145 mmol/L   Potassium 4.4 3.5 - 5.1 mmol/L   Chloride 107 101 - 111 mmol/L   CO2 19 (L) 22 - 32 mmol/L   Glucose, Bld 247 (H) 65 - 99 mg/dL   BUN 76 (H) 6 - 20 mg/dL   Creatinine, Ser 1.56 (H) 0.44 - 1.00 mg/dL   Calcium 8.7 (L) 8.9 - 10.3 mg/dL   Total Protein 6.0 (L) 6.5 - 8.1 g/dL   Albumin 3.3 (L) 3.5 - 5.0 g/dL   AST 24 15 - 41 U/L   ALT 18 14 - 54 U/L   Alkaline Phosphatase 65 38 - 126 U/L   Total Bilirubin 0.5 0.3 - 1.2 mg/dL   GFR calc non Af Amer 29 (L) >60 mL/min   GFR calc Af Amer 34 (L) >60 mL/min    Comment: (NOTE) The eGFR has been calculated using the CKD EPI equation. This calculation has not been validated in all clinical situations. eGFR's persistently <60 mL/min signify possible Chronic  Kidney Disease.    Anion gap 12 5 - 15  Lipase, blood     Status: None   Collection Time: 06/12/15  1:55 PM  Result Value Ref Range   Lipase 44 11 - 51 U/L  I-Stat Troponin, ED (not at [facility])     Status: None   Collection Time: 06/12/15  2:03 PM  Result Value Ref Range   Troponin i, poc 0.02 0.00 - 0.08 ng/mL   Comment 3            Comment: Due to the release kinetics of cTnI, a negative result within the first hours of the onset of symptoms does not rule out myocardial infarction with certainty. If myocardial infarction is still suspected, repeat the test at appropriate intervals.   I-Stat CG4 Lactic Acid, ED     Status: None   Collection Time: 06/12/15  2:05 PM  Result Value Ref Range   Lactic Acid, Venous 1.29 0.5 - 2.0 mmol/L  Urinalysis, Routine w reflex microscopic (not at [facility])     Status: Abnormal   Collection Time: 06/12/15  2:31 PM  Result Value Ref Range   Color, Urine YELLOW YELLOW   APPearance CLEAR CLEAR   Specific Gravity, Urine 1.013 1.005 - 1.030   pH 6.5 5.0 - 8.0   Glucose, UA 250 (A) NEGATIVE mg/dL   Hgb urine dipstick NEGATIVE NEGATIVE   Bilirubin Urine NEGATIVE NEGATIVE   Ketones, ur NEGATIVE NEGATIVE mg/dL   Protein, ur  30 (A) NEGATIVE mg/dL   Nitrite NEGATIVE NEGATIVE   Leukocytes, UA NEGATIVE NEGATIVE  Urine microscopic-add on     Status: Abnormal   Collection Time: 06/12/15  2:31 PM  Result Value Ref Range   Squamous Epithelial / LPF 0-5 (A) NONE SEEN   WBC, UA 0-5 0 - 5 WBC/hpf   RBC / HPF NONE SEEN 0 - 5 RBC/hpf   Bacteria, UA RARE (A) NONE SEEN  TSH     Status: None   Collection Time: 06/12/15  4:17 PM  Result Value Ref Range   TSH 2.149 0.350 - 4.500 uIU/mL  T4, free     Status: Abnormal   Collection Time: 06/12/15  4:17 PM  Result Value Ref Range   Free T4 1.17 (H) 0.61 - 1.12 ng/dL  I-Stat CG4 Lactic Acid, ED     Status: None   Collection Time: 06/12/15  4:20 PM  Result Value Ref Range   Lactic Acid, Venous 0.98 0.5 - 2.0 mmol/L   Gastrointestinal Panel by PCR , Stool     Status: None   Collection Time: 06/12/15  5:42 PM  Result Value Ref Range   Campylobacter species NOT DETECTED NOT DETECTED   Plesimonas shigelloides NOT DETECTED NOT DETECTED   Salmonella species NOT DETECTED NOT DETECTED   Yersinia enterocolitica NOT DETECTED NOT DETECTED   Vibrio species NOT DETECTED NOT DETECTED   Vibrio cholerae NOT DETECTED NOT DETECTED   Enteroaggregative E coli (EAEC) NOT DETECTED NOT DETECTED   Enteropathogenic E coli (EPEC) NOT DETECTED NOT DETECTED   Enterotoxigenic E coli (ETEC) NOT DETECTED NOT DETECTED   Shiga like toxin producing E coli (STEC) NOT DETECTED NOT DETECTED   E. coli O157 NOT DETECTED NOT DETECTED   Shigella/Enteroinvasive E coli (EIEC) NOT DETECTED NOT DETECTED   Cryptosporidium NOT DETECTED NOT DETECTED   Cyclospora cayetanensis NOT DETECTED NOT DETECTED   Entamoeba histolytica NOT DETECTED NOT DETECTED   Giardia lamblia NOT DETECTED NOT DETECTED   Adenovirus F40/41 NOT DETECTED NOT DETECTED   Astrovirus NOT DETECTED NOT DETECTED   Norovirus GI/GII NOT DETECTED NOT DETECTED   Rotavirus A NOT DETECTED NOT DETECTED   Sapovirus (I, II, IV, and V) NOT DETECTED NOT DETECTED  CBG monitoring, ED     Status: Abnormal   Collection Time: 06/12/15  6:14 PM  Result Value Ref Range   Glucose-Capillary 213 (H) 65 - 99 mg/dL  Glucose, capillary     Status: Abnormal   Collection Time: 06/12/15  7:59 PM  Result Value Ref Range   Glucose-Capillary 202 (H) 65 - 99 mg/dL  Glucose, capillary     Status: Abnormal   Collection Time: 06/13/15 12:10 AM  Result Value Ref Range   Glucose-Capillary 105 (H) 65 - 99 mg/dL  Glucose, capillary     Status: Abnormal   Collection Time: 06/13/15  4:41 AM  Result Value Ref Range   Glucose-Capillary 124 (H) 65 - 99 mg/dL  Basic metabolic panel     Status: Abnormal   Collection Time: 06/13/15  5:15 AM  Result Value Ref Range   Sodium 141 135 - 145 mmol/L   Potassium 4.3  3.5 - 5.1 mmol/L   Chloride 110 101 - 111 mmol/L   CO2 21 (L) 22 - 32 mmol/L   Glucose, Bld 140 (H) 65 - 99 mg/dL   BUN 69 (H) 6 - 20 mg/dL   Creatinine, Ser 1.78 (H) 0.44 - 1.00 mg/dL   Calcium 8.5 (L) 8.9 -  10.3 mg/dL   GFR calc non Af Amer 25 (L) >60 mL/min   GFR calc Af Amer 29 (L) >60 mL/min    Comment: (NOTE) The eGFR has been calculated using the CKD EPI equation. This calculation has not been validated in all clinical situations. eGFR's persistently <60 mL/min signify possible Chronic Kidney Disease.    Anion gap 10 5 - 15  CBC     Status: Abnormal   Collection Time: 06/13/15  5:15 AM  Result Value Ref Range   WBC 5.2 4.0 - 10.5 K/uL   RBC 3.59 (L) 3.87 - 5.11 MIL/uL   Hemoglobin 10.4 (L) 12.0 - 15.0 g/dL   HCT 32.3 (L) 36.0 - 46.0 %   MCV 90.0 78.0 - 100.0 fL   MCH 29.0 26.0 - 34.0 pg   MCHC 32.2 30.0 - 36.0 g/dL   RDW 13.2 11.5 - 15.5 %   Platelets 156 150 - 400 K/uL  Glucose, capillary     Status: Abnormal   Collection Time: 06/13/15  7:32 AM  Result Value Ref Range   Glucose-Capillary 108 (H) 65 - 99 mg/dL  Glucose, capillary     Status: Abnormal   Collection Time: 06/13/15 11:36 AM  Result Value Ref Range   Glucose-Capillary 133 (H) 65 - 99 mg/dL  Glucose, capillary     Status: Abnormal   Collection Time: 06/13/15  5:20 PM  Result Value Ref Range   Glucose-Capillary 105 (H) 65 - 99 mg/dL  Glucose, capillary     Status: Abnormal   Collection Time: 06/13/15  8:22 PM  Result Value Ref Range   Glucose-Capillary 174 (H) 65 - 99 mg/dL   Comment 1 Notify RN   C difficile quick scan w PCR reflex     Status: None   Collection Time: 06/13/15  9:30 PM  Result Value Ref Range   C Diff antigen NEGATIVE NEGATIVE   C Diff toxin NEGATIVE NEGATIVE   C Diff interpretation Negative for toxigenic C. difficile   Glucose, capillary     Status: None   Collection Time: 06/13/15 11:50 PM  Result Value Ref Range   Glucose-Capillary 87 65 - 99 mg/dL  Glucose, capillary      Status: None   Collection Time: 06/14/15  4:19 AM  Result Value Ref Range   Glucose-Capillary 96 65 - 99 mg/dL   Comment 1 Notify RN   Glucose, capillary     Status: None   Collection Time: 06/14/15  7:21 AM  Result Value Ref Range   Glucose-Capillary 95 65 - 99 mg/dL  Glucose, capillary     Status: Abnormal   Collection Time: 06/14/15 12:53 PM  Result Value Ref Range   Glucose-Capillary 185 (H) 65 - 99 mg/dL  Glucose, capillary     Status: Abnormal   Collection Time: 06/14/15  5:25 PM  Result Value Ref Range   Glucose-Capillary 117 (H) 65 - 99 mg/dL  Glucose, capillary     Status: Abnormal   Collection Time: 06/14/15 10:00 PM  Result Value Ref Range   Glucose-Capillary 162 (H) 65 - 99 mg/dL   Comment 1 Notify RN   Basic metabolic panel     Status: Abnormal   Collection Time: 06/15/15  5:11 AM  Result Value Ref Range   Sodium 143 135 - 145 mmol/L   Potassium 4.9 3.5 - 5.1 mmol/L   Chloride 114 (H) 101 - 111 mmol/L   CO2 22 22 - 32 mmol/L   Glucose, Bld 133 (  H) 65 - 99 mg/dL   BUN 42 (H) 6 - 20 mg/dL   Creatinine, Ser 1.48 (H) 0.44 - 1.00 mg/dL   Calcium 8.6 (L) 8.9 - 10.3 mg/dL   GFR calc non Af Amer 31 (L) >60 mL/min   GFR calc Af Amer 36 (L) >60 mL/min    Comment: (NOTE) The eGFR has been calculated using the CKD EPI equation. This calculation has not been validated in all clinical situations. eGFR's persistently <60 mL/min signify possible Chronic Kidney Disease.    Anion gap 7 5 - 15  Glucose, capillary     Status: Abnormal   Collection Time: 06/15/15  7:45 AM  Result Value Ref Range   Glucose-Capillary 122 (H) 65 - 99 mg/dL   Comment 1 Notify RN   Glucose, capillary     Status: Abnormal   Collection Time: 06/15/15 11:59 AM  Result Value Ref Range   Glucose-Capillary 247 (H) 65 - 99 mg/dL   Comment 1 Notify RN   Comprehensive metabolic panel     Status: Abnormal   Collection Time: 06/21/15  2:26 PM  Result Value Ref Range   Sodium 145 135 - 145 mmol/L    Potassium 4.7 3.5 - 5.1 mmol/L   Chloride 113 (H) 101 - 111 mmol/L   CO2 25 22 - 32 mmol/L   Glucose, Bld 136 (H) 65 - 99 mg/dL   BUN 38 (H) 6 - 20 mg/dL   Creatinine, Ser 1.29 (H) 0.44 - 1.00 mg/dL   Calcium 9.2 8.9 - 10.3 mg/dL   Total Protein 6.3 (L) 6.5 - 8.1 g/dL   Albumin 3.6 3.5 - 5.0 g/dL   AST 22 15 - 41 U/L   ALT 19 14 - 54 U/L   Alkaline Phosphatase 59 38 - 126 U/L   Total Bilirubin 0.3 0.3 - 1.2 mg/dL   GFR calc non Af Amer 37 (L) >60 mL/min   GFR calc Af Amer 43 (L) >60 mL/min    Comment: (NOTE) The eGFR has been calculated using the CKD EPI equation. This calculation has not been validated in all clinical situations. eGFR's persistently <60 mL/min signify possible Chronic Kidney Disease.    Anion gap 7 5 - 15  Lipase, blood     Status: None   Collection Time: 06/21/15  2:26 PM  Result Value Ref Range   Lipase 24 11 - 51 U/L  CBC with Differential     Status: Abnormal   Collection Time: 06/21/15  2:27 PM  Result Value Ref Range   WBC 4.4 4.0 - 10.5 K/uL   RBC 3.97 3.87 - 5.11 MIL/uL   Hemoglobin 11.8 (L) 12.0 - 15.0 g/dL   HCT 35.3 (L) 36.0 - 46.0 %   MCV 88.9 78.0 - 100.0 fL   MCH 29.7 26.0 - 34.0 pg   MCHC 33.4 30.0 - 36.0 g/dL   RDW 13.4 11.5 - 15.5 %   Platelets 161 150 - 400 K/uL   Neutrophils Relative % 51 %   Neutro Abs 2.3 1.7 - 7.7 K/uL   Lymphocytes Relative 41 %   Lymphs Abs 1.8 0.7 - 4.0 K/uL   Monocytes Relative 6 %   Monocytes Absolute 0.3 0.1 - 1.0 K/uL   Eosinophils Relative 2 %   Eosinophils Absolute 0.1 0.0 - 0.7 K/uL   Basophils Relative 0 %   Basophils Absolute 0.0 0.0 - 0.1 K/uL  Urinalysis, Routine w reflex microscopic (not at [facility])     Status: Abnormal  Collection Time: 06/21/15  2:31 PM  Result Value Ref Range   Color, Urine YELLOW YELLOW   APPearance CLEAR CLEAR   Specific Gravity, Urine 1.008 1.005 - 1.030   pH 7.0 5.0 - 8.0   Glucose, UA NEGATIVE NEGATIVE mg/dL   Hgb urine dipstick NEGATIVE NEGATIVE   Bilirubin Urine  NEGATIVE NEGATIVE   Ketones, ur NEGATIVE NEGATIVE mg/dL   Protein, ur 30 (A) NEGATIVE mg/dL   Nitrite NEGATIVE NEGATIVE   Leukocytes, UA NEGATIVE NEGATIVE  Urine microscopic-add on     Status: None   Collection Time: 06/21/15  2:31 PM  Result Value Ref Range   Squamous Epithelial / LPF NONE SEEN NONE SEEN   WBC, UA NONE SEEN 0 - 5 WBC/hpf   RBC / HPF NONE SEEN 0 - 5 RBC/hpf   Bacteria, UA NONE SEEN NONE SEEN  Urinalysis, Routine w reflex microscopic     Status: Abnormal   Collection Time: 07/02/15  6:34 AM  Result Value Ref Range   Color, Urine YELLOW YELLOW   APPearance CLEAR CLEAR   Specific Gravity, Urine 1.001 (L) 1.005 - 1.030   pH 6.0 5.0 - 8.0   Glucose, UA NEGATIVE NEGATIVE mg/dL   Hgb urine dipstick NEGATIVE NEGATIVE   Bilirubin Urine NEGATIVE NEGATIVE   Ketones, ur NEGATIVE NEGATIVE mg/dL   Protein, ur NEGATIVE NEGATIVE mg/dL   Nitrite NEGATIVE NEGATIVE   Leukocytes, UA NEGATIVE NEGATIVE    Comment: MICROSCOPIC NOT DONE ON URINES WITH NEGATIVE PROTEIN, BLOOD, LEUKOCYTES, NITRITE, OR GLUCOSE <1000 mg/dL.  Basic metabolic panel     Status: Abnormal   Collection Time: 07/02/15  7:57 AM  Result Value Ref Range   Sodium 144 135 - 145 mmol/L   Potassium 4.6 3.5 - 5.1 mmol/L   Chloride 112 (H) 101 - 111 mmol/L   CO2 23 22 - 32 mmol/L   Glucose, Bld 123 (H) 65 - 99 mg/dL   BUN 57 (H) 6 - 20 mg/dL   Creatinine, Ser 1.57 (H) 0.44 - 1.00 mg/dL   Calcium 9.1 8.9 - 10.3 mg/dL   GFR calc non Af Amer 29 (L) >60 mL/min   GFR calc Af Amer 33 (L) >60 mL/min    Comment: (NOTE) The eGFR has been calculated using the CKD EPI equation. This calculation has not been validated in all clinical situations. eGFR's persistently <60 mL/min signify possible Chronic Kidney Disease.    Anion gap 9 5 - 15  Brain natriuretic peptide     Status: Abnormal   Collection Time: 07/02/15  7:57 AM  Result Value Ref Range   B Natriuretic Peptide 246.4 (H) 0.0 - 100.0 pg/mL  I-stat troponin, ED      Status: None   Collection Time: 07/02/15  8:07 AM  Result Value Ref Range   Troponin i, poc 0.02 0.00 - 0.08 ng/mL   Comment 3            Comment: Due to the release kinetics of cTnI, a negative result within the first hours of the onset of symptoms does not rule out myocardial infarction with certainty. If myocardial infarction is still suspected, repeat the test at appropriate intervals.     Objective: General: Patient is awake, alert, and oriented x 3 and in no acute distress.  Integument: Skin is warm, dry and supple bilateral. Nails are tender, long, thickened and  dystrophic with subungual debris, consistent with onychomycosis, 1-5 bilateral. No signs of infection. No open lesions or preulcerative lesions present bilateral.  Remaining integument unremarkable.  Vasculature:  Dorsalis Pedis pulse 2/4 bilateral. Posterior Tibial pulse  1/4 bilateral.  Capillary fill time <3 sec 1-5 bilateral. Positive hair growth to the level of the digits. Temperature gradient within normal limits. No varicosities present bilateral. No edema present bilateral.   Neurology: The patient has intact sensation measured with a 5.07/10g Semmes Weinstein Monofilament at all pedal sites bilateral . Vibratory sensation intact bilateral with tuning fork. No Babinski sign present bilateral.   Musculoskeletal: No gross pedal deformities noted bilateral. Muscular strength 5/5 in all lower extremity muscular groups bilateral without pain on range of motion . No tenderness with calf compression bilateral.  Assessment and Plan: Problem List Items Addressed This Visit    None    Visit Diagnoses    Pain due to onychomycosis of toenail    -  Primary    Diabetes mellitus without complication ([place])           -Examined patient. -Discussed and educated patient on diabetic foot care, especially with  regards to the vascular, neurological and musculoskeletal systems.  -Stressed the importance of good glycemic  control and the detriment of not  controlling glucose levels in relation to the foot. -Mechanically debrided all nails 1-5 bilateral using sterile nail nipper and filed with dremel without incident  -Answered all patient questions -Patient to return in 4 months for at risk foot care -Patient advised to call the office if any problems or questions arise in the  Meantime.  Landis Martins, DPM

## 2015-07-23 NOTE — Discharge Summary (Signed)
Physician Discharge Summary  Patient ID: [name] MRN: [id number] DOB/AGE: 04-11-1927 82 y.o.  Admit date: 06/12/2015 Discharge date: 07/23/2015  Admission Diagnoses: Nausea, vomiting and dizziness.  Discharge Diagnoses:  Principal Problem:   Adverse drug reaction Active Problems:   Coronary artery disease   Diabetes mellitus with nephropathy (HCC)   Nausea & vomiting   Dizziness   Urinary retention   Discharged Condition: good  Hospital Course:  Nausea, vomiting 2/2 drug side effect. She complains of dizziness which could be contributing to the nausea and vomiting as well. Head CTscan negative.  Likely sx are due to new med and not gastroenteritis.  -Admitted to observation - Medical bed -Gentle IV hydration ( cardiac function unknown but she is on Coreg) -held [employer] -GI pathogen panel neg -prn Zofran Q6 hours x 4 doses -diet adv as tolerated -Unfortunately the half life of this medication was 60-80 hours. Further compounding the issue is that this medication is excreted renally and patient has mild renal impairment. - GI panel neg, C diff x3 neg; pt had been on contact precautions these were removed - Pt was able to recover inpt and d/c home.  Dizziness, likely multifactorial due to dehydration and drug side effect; improved again - Head CTscan negative -Gentle IV hydration through tonight -considered trial of meclizine is no improvement  CKD stage 3- 4, based on previous labs. BUN to Cr ratio is elevated likely 2/2 Dehydration. Baseline around 1.5 -IV hydration, gently to prevent fluid overload -am bmets -avoided nephrotoxic medications - kidney function improved over time  Urinary retention. Etiology? Doesn't take anti-cholinergics nor narcotics.  -foley placed and removed, pt able to avoid after, no issues  Hypothermia, rectal temp 95.2 initially. Normal WBC and lactic acid. Doubted sepsis after repeated eval of patient. Resolved.  Hypertension.  Currently stable  -held lasix - held home cozaar, coreg and clonidine and resumed as blood pressure demanded - prn hydralazine was ordered.   Diabetes, type 2. Uncontrolled.  -Held home tradjenta -CBGs Q4 with SSI. Change to [facility] and HS when taking PO  Hyperlipidemia.  -held pt's anti lipid medication during most of her stay due to difficulty holding down oral intake  -?dementia.  On my many assessments pt has no neuro deficits. Will need PCP f/u on this issue.  Consults: None  Significant Diagnostic Studies:   2 View CXR IMPRESSION: Left ventricular prominence. Atherosclerosis. Coronary stents. No active pulmonary disease.   Electronically Signed  By: Paulina Fusi M.D.  On: 06/12/2015  Head CT w/o Cont IMPRESSION: Atrophy with slight periventricular small vessel disease. No intracranial mass, hemorrhage, or evidence of acute infarct. Probable cerumen in each external auditory canal.   Electronically Signed  By: Bretta Bang III M.D.  On: 06/12/2015  Treatments: IV hydration, anti emetics and insulin SS  Discharge Exam: Blood pressure 101/59, pulse 58, temperature 98.7 F (37.1 C), temperature source Oral, resp. rate 17, height  (1.575 m), weight 61.689 kg (136 lb), SpO2 100 %. General appearance: alert, cooperative and appears stated age Head: Normocephalic, without obvious abnormality, atraumatic Eyes: conjunctivae/corneas clear. PERRL, EOM's intact. Fundi benign. Neck: no adenopathy, no carotid bruit, no JVD, supple, symmetrical, trachea midline and thyroid not enlarged, symmetric, no tenderness/mass/nodules Resp: clear to auscultation bilaterally Cardio: regular rate and rhythm, S1, S2 normal, no murmur, click, rub or gallop GI: soft, non-tender; bowel sounds normal; no masses,  no organomegaly Extremities: extremities normal, atraumatic, no cyanosis or edema Pulses: 2+ and symmetric Skin: Skin color, texture,  turgor normal. No rashes or  lesions Neurologic: Alert and oriented X 3, normal strength and tone.  Normal coordination and gait  Disposition: 01-Home or Self Care     Medication List    STOP taking these medications        aspirin 81 MG tablet     benzonatate 100 MG capsule  Commonly known as:  TESSALON     linagliptin 5 MG Tabs tablet  Commonly known as:  TRADJENTA     loratadine 10 MG tablet  Commonly known as:  CLARITIN     Memantine HCl-Donepezil HCl 28-10 MG Cp24  Commonly known as:  NAMZARIC     omega-3 acid ethyl esters 1 g capsule  Commonly known as:  LOVAZA     PROLENSA 0.07 % Soln  Generic drug:  Bromfenac Sodium     WELCHOL 625 MG tablet  Generic drug:  colesevelam      TAKE these medications        amLODipine 5 MG tablet  Commonly known as:  NORVASC  Take 5 mg by mouth daily.     Capsaicin-Menthol-Methyl Sal 0.025-1-12 % Crea  Commonly known as:  capsaicin-methyl sal-menthol  Apply 1 application topically 2 (two) times daily.     carvedilol 3.125 MG tablet  Commonly known as:  COREG  One twice daily to control BP and strengthen the heart     cloNIDine 0.1 MG tablet  Commonly known as:  CATAPRES  Take 1 tablet (0.1 mg total) by mouth daily. For high blood pressure     ezetimibe-simvastatin 10-20 MG tablet  Commonly known as:  VYTORIN  1/2 tablet nightly to control cholesterol     furosemide 20 MG tablet  Commonly known as:  LASIX  Take 1 tablet (20 mg total) by mouth daily. For swelling     glucose blood test strip  E11.59 Check blood sugar daily as directed     losartan 50 MG tablet  Commonly known as:  COZAAR  Take 1 tablet (50 mg total) by mouth daily. For high blood pressure     LOTEMAX 0.5 % Gel  Generic drug:  Loteprednol Etabonate  One drop in the left eye twice daly to control glaucome     metFORMIN 500 MG tablet  Commonly known as:  GLUCOPHAGE  Take 500 mg by mouth daily.     nitroGLYCERIN 0.4 MG SL tablet  Commonly known as:  NITROSTAT  Dissolve  one tablet under tongue as needed for chest pain. May repeat as needed every 5 minutes up to 3 doses. Seek medical attention if pain is not relieved after 3 doses     polyethylene glycol powder powder  Commonly known as:  GLYCOLAX/MIRALAX  DISSOLVE 17 GRAMS (1 CAPFUL) INTO 8 OUNCES OF LIQUID AND DRINK EVERY DAY AS NEEDED FOR BOWELS     timolol 0.5 % ophthalmic solution  Commonly known as:  BETIMOL  One drop in the left eye daily to help control glaucoma           Follow-up Information    Follow up with GREEN, Lenon CurtARTHUR G, MD In 2 weeks.   Specialty:  Internal Medicine   Why:  namzaric side effects   Contact information:   [address] [place] [id number] [phone]       Signed: Haydee Salterhillip M Hobbs 07/23/2015, 1:10 PM

## 2015-08-18 ENCOUNTER — Other Ambulatory Visit: Payer: Self-pay | Admitting: Nurse Practitioner

## 2015-08-19 ENCOUNTER — Other Ambulatory Visit: Payer: Self-pay | Admitting: *Deleted

## 2015-08-19 MED ORDER — NITROGLYCERIN 0.4 MG SL SUBL: SUBLINGUAL_TABLET | SUBLINGUAL | Status: DC

## 2015-08-19 NOTE — Telephone Encounter (Signed)
[facility] 

## 2015-08-25 ENCOUNTER — Ambulatory Visit (INDEPENDENT_AMBULATORY_CARE_PROVIDER_SITE_OTHER): Payer: Medicare Other | Admitting: Internal Medicine

## 2015-08-25 ENCOUNTER — Encounter: Payer: Self-pay | Admitting: Internal Medicine

## 2015-08-25 VITALS — BP 122/80 | HR 68 | Temp 98.3°F | Ht 60.0 in | Wt 145.0 lb

## 2015-08-25 DIAGNOSIS — H409 Unspecified glaucoma: Secondary | ICD-10-CM | POA: Diagnosis not present

## 2015-08-25 DIAGNOSIS — I5032 Chronic diastolic (congestive) heart failure: Secondary | ICD-10-CM | POA: Diagnosis not present

## 2015-08-25 DIAGNOSIS — E1121 Type 2 diabetes mellitus with diabetic nephropathy: Secondary | ICD-10-CM

## 2015-08-25 DIAGNOSIS — N183 Chronic kidney disease, stage 3 unspecified: Secondary | ICD-10-CM

## 2015-08-25 DIAGNOSIS — E785 Hyperlipidemia, unspecified: Secondary | ICD-10-CM | POA: Diagnosis not present

## 2015-08-25 DIAGNOSIS — F0390 Unspecified dementia without behavioral disturbance: Secondary | ICD-10-CM | POA: Diagnosis not present

## 2015-08-25 DIAGNOSIS — D649 Anemia, unspecified: Secondary | ICD-10-CM

## 2015-08-25 DIAGNOSIS — I10 Essential (primary) hypertension: Secondary | ICD-10-CM | POA: Diagnosis not present

## 2015-08-25 MED ORDER — LOTEMAX 0.5 % OP GEL: OPHTHALMIC | Status: DC

## 2015-08-25 NOTE — Progress Notes (Signed)
Patient ID: [name], female   DOB: 1928-01-07, 82 y.o.   MRN: [id number]    Facility  [facility]    Place of Service:   OFFICE    Allergies  Allergen Reactions  . Namzaric [Memantine Hcl-Donepezil Hcl] Nausea Only    confusion  . Percocet [Oxycodone-Acetaminophen] Other (See Comments)    loosy goosy  . Sulfa Antibiotics Other (See Comments)    Tongue swells  . Adhesive [Tape] Other (See Comments)    Tears skin.  Please use "paper" tape  . Metformin And Related Other (See Comments)    Abnormal Kidney Functions   . Tradjenta [Linagliptin]     Low back ache, resolved once medication stopped   . Penicillins Hives    Tolerates Ceftriaxone, Has patient had a PCN reaction causing immediate rash, facial/tongue/throat swelling, SOB or lightheadedness with hypotension: Unknown Has patient had a PCN reaction causing severe rash involving mucus membranes or skin necrosis: No Has patient had a PCN reaction that required hospitalization No Has patient had a PCN reaction occurring within the last 10 years: No If all of the above answers are "NO", then may proceed with Cephalosporin use.     Chief Complaint  Patient presents with  . Medical Management of Chronic Issues    4 month medication management blood pressure, blood sugar. Patient is out of Norvasc, Coreg, Lasix , Cozaar, Lotemax, Nitrostat, she doesn't know if she is to take them or not, the pharmacy stop sending them to her.     HPI:  Essential hypertension - Patient has quit taking several of her medications. She says the pharmacy quit sending them to her so she wasn't sure that she should be taking them. She is feeling "the best I have in years". Her blood pressure seems controlled on her current medication.  Hyperlipidemia - controlled on simvastatin-ezitimibe  Diabetes mellitus with nephropathy ([place]) - controlled  Dementia, without behavioral disturbance - unchanged  Chronic kidney disease, stage III (moderate) - follow-up  lab next visit  Chronic diastolic heart failure, NYHA class 1 (HCC) - No shortness of breath. There is trace to 1+ bipedal edema.  Glaucoma - Ran out of Lotemax but she wants this refilled.    Medications: Patient's Medications  New Prescriptions   No medications on file  Previous Medications   AMLODIPINE (NORVASC) 5 MG TABLET    Take 5 mg by mouth daily. Reported on 08/25/2015   CAPSAICIN-MENTHOL-METHYL SAL (CAPSAICIN-METHYL SAL-MENTHOL) 0.025-1-12 % CREA    Apply 1 application topically 2 (two) times daily.   CARVEDILOL (COREG) 3.125 MG TABLET    One twice daily to control BP and strengthen the heart   CLONIDINE (CATAPRES) 0.1 MG TABLET    Take 1 tablet (0.1 mg total) by mouth daily. For high blood pressure   EZETIMIBE-SIMVASTATIN (VYTORIN) 10-20 MG TABLET    1/2 tablet nightly to control cholesterol   FUROSEMIDE (LASIX) 20 MG TABLET    Take 1 tablet (20 mg total) by mouth daily. For swelling   GLUCOSE BLOOD TEST STRIP    E11.59 Check blood sugar daily as directed   LOSARTAN (COZAAR) 50 MG TABLET    TAKE ONE TABLET BY MOUTH DAILY FOR HIGH BLOOD PRESSURE.   LOTEMAX 0.5 % GEL    One drop in the left eye twice daly to control glaucome   METFORMIN (GLUCOPHAGE) 500 MG TABLET    Take 500 mg by mouth daily.   NITROGLYCERIN (NITROSTAT) 0.4 MG SL TABLET    Dissolve  one tablet under tongue as needed for chest pain. May repeat as needed every 5 minutes up to 3 doses. Seek medical attention if pain is not relieved after 3 doses   POLYETHYLENE GLYCOL POWDER (GLYCOLAX/MIRALAX) POWDER    DISSOLVE 17 GRAMS (1 CAPFUL) INTO 8 OUNCES OF LIQUID AND DRINK EVERY DAY AS NEEDED FOR BOWELS   TIMOLOL (BETIMOL) 0.5 % OPHTHALMIC SOLUTION    One drop in the left eye daily to help control glaucoma  Modified Medications   No medications on file  Discontinued Medications   CLONIDINE (CATAPRES) 0.1 MG TABLET    TAKE 1 TABLET BY MOUTH ONCE DAILY    Review of Systems  Constitutional: Negative for fever, chills,  diaphoresis, activity change, appetite change, fatigue and unexpected weight change.  HENT: Positive for sore throat (Acute issue). Negative for congestion, ear discharge, ear pain, hearing loss, postnasal drip, rhinorrhea, tinnitus, trouble swallowing and voice change.   Eyes: Positive for visual disturbance (corrective lenses). Negative for pain, redness and itching.       History of glaucoma  Respiratory: Negative for cough, choking, shortness of breath and wheezing.   Cardiovascular: Positive for leg swelling. Negative for chest pain and palpitations.       History of chronic diastolic heart failure  Gastrointestinal: Positive for abdominal pain and constipation. Negative for nausea, diarrhea, abdominal distention, anal bleeding and rectal pain.  Endocrine: Negative for cold intolerance, heat intolerance, polydipsia, polyphagia and polyuria.  Genitourinary: Negative for dysuria, urgency, frequency, hematuria, flank pain, vaginal discharge, difficulty urinating and pelvic pain.       Indwelling Foley catheter. Mass at rectum following straining CKD 3  Musculoskeletal: Positive for gait problem. Negative for myalgias, back pain, arthralgias, neck pain and neck stiffness.  Skin: Negative for color change, pallor, rash and wound.       Pain in the left great toe  Allergic/Immunologic: Negative.   Neurological: Negative for dizziness, tremors, seizures, syncope, weakness, light-headedness, numbness and headaches.       Mild-moderate dementia  Hematological: Negative for adenopathy. Does not bruise/bleed easily.       Chronic Anemia  Psychiatric/Behavioral: Positive for confusion. Negative for suicidal ideas, hallucinations, behavioral problems, sleep disturbance, dysphoric mood and agitation. The patient is not nervous/anxious and is not hyperactive.     Filed Vitals:   08/25/15 1522  BP: 122/80  Pulse: 68  Temp: 98.3 F (36.8 C)  TempSrc: Oral  Height: 5' (1.524 m)  Weight: 145 lb  (65.772 kg)  SpO2: 93%   Body mass index is 28.32 kg/(m^2). Filed Weights   08/25/15 1522  Weight: 145 lb (65.772 kg)     Physical Exam  Constitutional: She is oriented to person, place, and time. She appears well-developed and well-nourished. No distress.  Frail elderly female, NAD  HENT:  Head: Normocephalic and atraumatic.  Right Ear: External ear normal.  Left Ear: External ear normal.  Nose: Nose normal.  Mouth/Throat: Oropharynx is clear and moist. No oropharyngeal exudate.  Eyes: Conjunctivae and EOM are normal. Pupils are equal, round, and reactive to light. No scleral icterus.  Neck: Normal range of motion. Neck supple. No JVD present. No tracheal deviation present. No thyromegaly present.  Cardiovascular: Normal rate, regular rhythm, normal heart sounds and intact distal pulses.  Exam reveals no gallop and no friction rub.   No murmur heard. Pulmonary/Chest: Effort normal and breath sounds normal. No respiratory distress. She has no wheezes. She has no rales. She exhibits no tenderness.  Abdominal: Soft.  Bowel sounds are normal. She exhibits no distension and no mass. There is no tenderness.  Genitourinary:  Loose anal sphincter. No palpable rectal mass. With straining, there is some mucosal eversion. Removed Foley catheter during office visit 06/24/15.  Musculoskeletal: Normal range of motion. She exhibits edema. She exhibits no tenderness.  Lymphadenopathy:    She has no cervical adenopathy.  Neurological: She is alert and oriented to person, place, and time. No cranial nerve deficit. Coordination normal.  Slow unstable gait 06/10/15 MMSE 20/30. Failed clock drawing.  Skin: Skin is warm and dry. No rash noted. She is not diaphoretic. No erythema. No pallor.  Ingrown nail of the left great toe.  Psychiatric: She has a normal mood and affect. Her behavior is normal. Judgment and thought content normal.    Labs reviewed: Lab Summary Latest Ref Rng 07/02/2015 06/21/2015  06/15/2015 06/13/2015 06/12/2015  Hemoglobin 12.0 - 15.0 g/dL (None) 11.8(L) (None) 10.4(L) 11.7(L)  Hematocrit 36.0 - 46.0 % (None) 35.3(L) (None) 32.3(L) 34.8(L)  White count 4.0 - 10.5 K/uL (None) 4.4 (None) 5.2 4.4  Platelet count 150 - 400 K/uL (None) 161 (None) 156 133(L)  Sodium 135 - 145 mmol/L 144 145 143 141 138  Potassium 3.5 - 5.1 mmol/L 4.6 4.7 4.9 4.3 4.4  Calcium 8.9 - 10.3 mg/dL 9.1 9.2 8.6(L) 8.5(L) 8.7(L)  Phosphorus - (None) (None) (None) (None) (None)  Creatinine 0.44 - 1.00 mg/dL 1.57(H) 1.29(H) 1.48(H) 1.78(H) 1.56(H)  AST 15 - 41 U/L (None) 22 (None) (None) 24  Alk Phos 38 - 126 U/L (None) 59 (None) (None) 65  Bilirubin 0.3 - 1.2 mg/dL (None) 0.3 (None) (None) 0.5  Glucose 65 - 99 mg/dL 123(H) 136(H) 133(H) 140(H) 247(H)  Cholesterol - (None) (None) (None) (None) (None)  HDL cholesterol - (None) (None) (None) (None) (None)  Triglycerides - (None) (None) (None) (None) (None)  LDL Direct - (None) (None) (None) (None) (None)  LDL Calc - (None) (None) (None) (None) (None)  Total protein 6.5 - 8.1 g/dL (None) 6.3(L) (None) (None) 6.0(L)  Albumin 3.5 - 5.0 g/dL (None) 3.6 (None) (None) 3.3(L)   Lab Results  Component Value Date   TSH 2.149 06/12/2015   TSH 4.25 05/01/2014   TSH 5.43* 04/02/2014   Lab Results  Component Value Date   BUN 57* 07/02/2015   BUN 38* 06/21/2015   BUN 42* 06/15/2015   Lab Results  Component Value Date   HGBA1C 7.9* 05/22/2015   HGBA1C 7.5* 04/02/2015   HGBA1C 7.8* 03/09/2015    Assessment/Plan 1. Essential hypertension - Comprehensive metabolic panel; Future  2. Hyperlipidemia Continue simvastatin/ ezitimibe  3. Diabetes mellitus with nephropathy (HCC) - Comprehensive metabolic panel; Future - Hemoglobin A1c; Future  4. Dementia, without behavioral disturbance Unchanged  5. Chronic kidney disease, stage III (moderate) -CMP, future  6. Chronic diastolic heart failure, NYHA class 1 (HCC) Observe. Patient has stopped  several medications on her own or because of failure of the pharmacy to refill Coreg and losartan. Nevertheless she seems stable at present time and we will simply observe rather than resume these drugs.  7. Glaucoma - LOTEMAX 0.5 % GEL; One drop in the left eye twice daly to control glaucome  Dispense: 5 g; Refill: 2  8. Anemia, unspecified anemia type - CBC with Differential/Platelet; Future

## 2015-09-14 ENCOUNTER — Ambulatory Visit: Payer: Medicare Other | Admitting: Pharmacotherapy

## 2015-10-06 ENCOUNTER — Encounter: Payer: Self-pay | Admitting: Nurse Practitioner

## 2015-10-06 ENCOUNTER — Ambulatory Visit (INDEPENDENT_AMBULATORY_CARE_PROVIDER_SITE_OTHER): Payer: Medicare Other | Admitting: Nurse Practitioner

## 2015-10-06 VITALS — BP 134/88 | HR 62 | Temp 98.2°F | Resp 17 | Ht 60.0 in | Wt 146.2 lb

## 2015-10-06 DIAGNOSIS — J00 Acute nasopharyngitis [common cold]: Secondary | ICD-10-CM | POA: Diagnosis not present

## 2015-10-06 DIAGNOSIS — H6123 Impacted cerumen, bilateral: Secondary | ICD-10-CM | POA: Diagnosis not present

## 2015-10-06 NOTE — Patient Instructions (Signed)
To use Claritin 10 mg daily as needed for runny nose May use dextromethorphan (delsym) twice daily as needed for cough  OR Mucinex DM twice daily with full glass of water for cough and congestion   Upper Respiratory Infection, Adult Most upper respiratory infections (URIs) are a viral infection of the air passages leading to the lungs. A URI affects the nose, throat, and upper air passages. The most common type of URI is nasopharyngitis and is typically referred to as "the common cold." URIs run their course and usually go away on their own. Most of the time, a URI does not require medical attention, but sometimes a bacterial infection in the upper airways can follow a viral infection. This is called a secondary infection. Sinus and middle ear infections are common types of secondary upper respiratory infections. Bacterial pneumonia can also complicate a URI. A URI can worsen asthma and chronic obstructive pulmonary disease (COPD). Sometimes, these complications can require emergency medical care and may be life threatening.  CAUSES Almost all URIs are caused by viruses. A virus is a type of germ and can spread from one person to another.  RISKS FACTORS You may be at risk for a URI if:   You smoke.   You have chronic heart or lung disease.  You have a weakened defense (immune) system.   You are very young or very old.   You have nasal allergies or asthma.  You work in crowded or poorly ventilated areas.  You work in health care facilities or schools. SIGNS AND SYMPTOMS  Symptoms typically develop 2-3 days after you come in contact with a cold virus. Most viral URIs last 7-10 days. However, viral URIs from the influenza virus (flu virus) can last 14-18 days and are typically more severe. Symptoms may include:   Runny or stuffy (congested) nose.   Sneezing.   Cough.   Sore throat.   Headache.   Fatigue.   Fever.   Loss of appetite.   Pain in your forehead, behind  your eyes, and over your cheekbones (sinus pain).  Muscle aches.  DIAGNOSIS  Your health care provider may diagnose a URI by:  Physical exam.  Tests to check that your symptoms are not due to another condition such as:  Strep throat.  Sinusitis.  Pneumonia.  Asthma. TREATMENT  A URI goes away on its own with time. It cannot be cured with medicines, but medicines may be prescribed or recommended to relieve symptoms. Medicines may help:  Reduce your fever.  Reduce your cough.  Relieve nasal congestion. HOME CARE INSTRUCTIONS   Take medicines only as directed by your health care provider.   Gargle warm saltwater or take cough drops to comfort your throat as directed by your health care provider.  Use a warm mist humidifier or inhale steam from a shower to increase air moisture. This may make it easier to breathe.  Drink enough fluid to keep your urine clear or pale yellow.   Eat soups and other clear broths and maintain good nutrition.   Rest as needed.   Return to work when your temperature has returned to normal or as your health care provider advises. You may need to stay home longer to avoid infecting others. You can also use a face mask and careful hand washing to prevent spread of the virus.  Increase the usage of your inhaler if you have asthma.   Do not use any tobacco products, including cigarettes, chewing tobacco, or electronic cigarettes.  If you need help quitting, ask your health care provider. PREVENTION  The best way to protect yourself from getting a cold is to practice good hygiene.   Avoid oral or hand contact with people with cold symptoms.   Wash your hands often if contact occurs.  There is no clear evidence that vitamin C, vitamin E, echinacea, or exercise reduces the chance of developing a cold. However, it is always recommended to get plenty of rest, exercise, and practice good nutrition.  SEEK MEDICAL CARE IF:   You are getting worse  rather than better.   Your symptoms are not controlled by medicine.   You have chills.  You have worsening shortness of breath.  You have brown or red mucus.  You have yellow or brown nasal discharge.  You have pain in your face, especially when you bend forward.  You have a fever.  You have swollen neck glands.  You have pain while swallowing.  You have white areas in the back of your throat. SEEK IMMEDIATE MEDICAL CARE IF:   You have severe or persistent:  Headache.  Ear pain.  Sinus pain.  Chest pain.  You have chronic lung disease and any of the following:  Wheezing.  Prolonged cough.  Coughing up blood.  A change in your usual mucus.  You have a stiff neck.  You have changes in your:  Vision.  Hearing.  Thinking.  Mood. MAKE SURE YOU:   Understand these instructions.  Will watch your condition.  Will get help right away if you are not doing well or get worse.   This information is not intended to replace advice given to you by your health care provider. Make sure you discuss any questions you have with your health care provider.   Document Released: 08/31/2000 Document Revised: 07/22/2014 Document Reviewed: 06/12/2013 Elsevier Interactive Patient Education [employer]

## 2015-10-06 NOTE — Progress Notes (Signed)
Patient ID: [name], female   DOB: 09-30-1927, 82 y.o.   MRN: [id number]    PCP: Murray Hodgkins, MD  Advanced Directive information Does patient have an advance directive?: Yes, Type of Advance Directive: Healthcare Power of [place];Living will, Does patient want to make changes to advanced directive?: No - Patient declined  Allergies  Allergen Reactions  . Namzaric [Memantine Hcl-Donepezil Hcl] Nausea Only    confusion  . Percocet [Oxycodone-Acetaminophen] Other (See Comments)    loosy goosy  . Sulfa Antibiotics Other (See Comments)    Tongue swells  . Adhesive [Tape] Other (See Comments)    Tears skin.  Please use "paper" tape  . Metformin And Related Other (See Comments)    Abnormal Kidney Functions   . Tradjenta [Linagliptin]     Low back ache, resolved once medication stopped   . Penicillins Hives    Tolerates Ceftriaxone, Has patient had a PCN reaction causing immediate rash, facial/tongue/throat swelling, SOB or lightheadedness with hypotension: Unknown Has patient had a PCN reaction causing severe rash involving mucus membranes or skin necrosis: No Has patient had a PCN reaction that required hospitalization No Has patient had a PCN reaction occurring within the last 10 years: No If all of the above answers are "NO", then may proceed with Cephalosporin use.     Chief Complaint  Patient presents with  . Medical Management of Chronic Issues    Cough/runny nose x 1 week. coughing up white mucus/drainage down throat.   . OTHER    Wants Shingles vaccine  . OTHER    Daughter in room     HPI: Patient is a 82 y.o. female seen in the office today due to a cold and worried about pneumonia. Has been going on a week and is about the same. Has a productive cough, runny nose, nasal congestion.  No fevers or chills.  Decrease energy but this is not new been going on "forever" Eating and drinking at baseline Cough does not keep her up at night No shortness of breath or  chest discomfort.  Has not taken any medication.  Rhinorrhea bothers her the most but today is the first day she does not have a runny nose     Review of Systems:  Review of Systems  Constitutional: Negative for fever, chills, appetite change and fatigue.  HENT: Positive for congestion and rhinorrhea. Negative for ear pain, facial swelling, hearing loss, mouth sores, sinus pressure and sore throat.   Respiratory: Negative for cough and shortness of breath.   Cardiovascular: Positive for leg swelling (chronic). Negative for chest pain.    Past Medical History  Diagnosis Date  . Coronary artery disease     Remote PCI. Had BMS to RCA and DES stent to Diagonal in Jan. 2012   . Diastolic heart failure     EF 55 to 60% per cath in Jan 2012  . Hyperlipidemia   . Hypertension   . Carotid bruit   . Gout   . Obesity   . TIA (transient ischemic attack)   . Ischemic cardiomyopathy     with prior EF of 35%  . Anemia   . Proteinuria   . Carotid artery occlusion   . Stroke [facility]) 2013    ?  mini    . DVT (deep venous thrombosis) (HCC)   . NSTEMI (non-ST elevated myocardial infarction) [facility]) Jan 2012    with PCI to RCA and DX per Dr. Excell Seltzer  . Pneumonia 2016  .  Type II diabetes mellitus (HCC)     long standing  . Chronic kidney disease (CKD), stage III (moderate)    Past Surgical History  Procedure Laterality Date  . Appendectomy    . Tonsillectomy    . Ankle fusion Bilateral   . Coronary angioplasty with stent placement  04/05/2010    distal RCA  . Fracture surgery Right 2005    bilateral ankles   . Abdominal aortagram N/A 01/17/2014    Procedure: ABDOMINAL AORTAGRAM;  Surgeon: Sherren Kerns, MD;  Location: [facility] CATH LAB;  Service: Cardiovascular;  Laterality: N/A;  . Cataract extraction, bilateral Bilateral    Social History:   reports that she has quit smoking. She has never used smokeless tobacco. She reports that she does not drink alcohol or use illicit drugs.  Family  History  Problem Relation Age of Onset  . Heart disease Neg Hx     both parents were murdered    Medications: Patient's Medications  New Prescriptions   No medications on file  Previous Medications   CLONIDINE (CATAPRES) 0.1 MG TABLET    Take 1 tablet (0.1 mg total) by mouth daily. For high blood pressure   EZETIMIBE-SIMVASTATIN (VYTORIN) 10-20 MG TABLET    1/2 tablet nightly to control cholesterol   LOTEMAX 0.5 % GEL    One drop in the left eye twice daly to control glaucome   METFORMIN (GLUCOPHAGE) 500 MG TABLET    Take 500 mg by mouth daily.   POLYETHYLENE GLYCOL POWDER (GLYCOLAX/MIRALAX) POWDER    DISSOLVE 17 GRAMS (1 CAPFUL) INTO 8 OUNCES OF LIQUID AND DRINK EVERY DAY AS NEEDED FOR BOWELS   TIMOLOL (BETIMOL) 0.5 % OPHTHALMIC SOLUTION    One drop in the left eye daily to help control glaucoma  Modified Medications   No medications on file  Discontinued Medications   CAPSAICIN-MENTHOL-METHYL SAL (CAPSAICIN-METHYL SAL-MENTHOL) 0.025-1-12 % CREA    Apply 1 application topically 2 (two) times daily.     Physical Exam:  Filed Vitals:   10/06/15 0838  BP: 134/88  Pulse: 62  Temp: 98.2 F (36.8 C)  TempSrc: Oral  Resp: 17  Height: 5' (1.524 m)  Weight: 146 lb 3.2 oz (66.316 kg)  SpO2: 92%   Body mass index is 28.55 kg/(m^2).  Physical Exam  Constitutional: No distress.  Frail elderly female, NAD  HENT:  Head: Normocephalic and atraumatic.  Right Ear: External ear normal.  Left Ear: External ear normal.  Nose: Mucosal edema and rhinorrhea present.  Mouth/Throat: Oropharynx is clear and moist. No oropharyngeal exudate.  Eyes: Conjunctivae are normal. Pupils are equal, round, and reactive to light.  Neck: Normal range of motion. Neck supple.  Cardiovascular: Normal rate, regular rhythm and normal heart sounds.   Pulmonary/Chest: Effort normal and breath sounds normal. No respiratory distress. She has no wheezes. She has no rales.  Musculoskeletal: She exhibits edema.  She exhibits no tenderness.  Neurological: She is alert.  Slow unstable gait  Skin: Skin is warm and dry. She is not diaphoretic.  Psychiatric: She has a normal mood and affect.    Labs reviewed: Basic Metabolic Panel:  Recent Labs  40/98/11 1617  06/15/15 0511 06/21/15 1426 07/02/15 0757  NA  --   < > 143 145 144  K  --   < > 4.9 4.7 4.6  CL  --   < > 114* 113* 112*  CO2  --   < > 22 25 23   GLUCOSE  --   < >  133* 136* 123*  BUN  --   < > 42* 38* 57*  CREATININE  --   < > 1.48* 1.29* 1.57*  CALCIUM  --   < > 8.6* 9.2 9.1  TSH 2.149  --   --   --   --   < > = values in this interval not displayed. Liver Function Tests:  Recent Labs  06/08/15 1053 06/12/15 1355 06/21/15 1426  AST 14 24 22   ALT 9 18 19   ALKPHOS 69 65 59  BILITOT 0.3 0.5 0.3  PROT 5.4* 6.0* 6.3*  ALBUMIN 3.4* 3.3* 3.6    Recent Labs  06/12/15 1355 06/21/15 1426  LIPASE 44 24   No results for input(s): AMMONIA in the last 8760 hours. CBC:  Recent Labs  04/03/15 0310  06/08/15 1053 06/12/15 1355 06/13/15 0515 06/21/15 1427  WBC 4.2  < > 5.7 4.4 5.2 4.4  NEUTROABS 2.3  --  3.2  --   --  2.3  HGB 11.3*  < >  --  11.7* 10.4* 11.8*  HCT 35.1*  < > 36.7 34.8* 32.3* 35.3*  MCV 95.9  < > 92 90.9 90.0 88.9  PLT 135*  < > 201 133* 156 161  < > = values in this interval not displayed. Lipid Panel:  Recent Labs  10/21/14 1150 03/09/15 1129  CHOL 135 143  HDL 43.80 35*  LDLCALC 62 74  TRIG 148.0 170*  CHOLHDL 3 4.1   TSH:  Recent Labs  06/12/15 1617  TSH 2.149   A1C: Lab Results  Component Value Date   HGBA1C 7.9* 05/22/2015     Assessment/Plan 1. Acute nasopharyngitis (common cold) Seems to be improving, supportive care -to use Claritin as needed and mucinex DM for cough and congestion BID OR delsym BID for cough -increase hydration  -return precautions discussed and pt understands  2. Cerumen impaction, bilateral Ear lavage done bilaterally, wax removed and pt  tolerated well To leave hearing aids out for ~ 1 hr to allow ears to dry  To keep follow up appt with Dr Chilton SiGreen or follow up sooner as needed   Shanda BumpsJessica K. Biagio BorgEubanks, AGNP  [facility] [phone](Monday-Friday 8 am - 5 pm) [phone] (after hours)

## 2015-10-27 ENCOUNTER — Telehealth: Payer: Self-pay

## 2015-10-27 NOTE — Telephone Encounter (Signed)
Phone call from pt's daughter, Stark BrayLynda.  Reported a sore has opened-up on medial ankle, and is approx. between dime and nickel size.  Reported some redness surrounding the open area.  Denied any swelling of the right LE.  Denied that there is any drainage.  Reported the pt. is using zinc oxide cream over the ulcer.  Advised will have a Scheduler contact her tomorrow, about a future appt.  Agreed w/ plan.

## 2015-10-28 ENCOUNTER — Other Ambulatory Visit: Payer: Self-pay | Admitting: Internal Medicine

## 2015-10-28 ENCOUNTER — Ambulatory Visit: Payer: Medicare Other | Admitting: Internal Medicine

## 2015-10-28 ENCOUNTER — Other Ambulatory Visit: Payer: Medicare Other

## 2015-10-29 NOTE — Telephone Encounter (Signed)
Scheduled an appt for 11/05/15 with CEF and abi's prior. Notified daughter Stark BrayLynda of this appt/at

## 2015-11-03 ENCOUNTER — Other Ambulatory Visit: Payer: Self-pay

## 2015-11-03 ENCOUNTER — Encounter: Payer: Self-pay | Admitting: Vascular Surgery

## 2015-11-03 DIAGNOSIS — N183 Chronic kidney disease, stage 3 unspecified: Secondary | ICD-10-CM

## 2015-11-03 DIAGNOSIS — I10 Essential (primary) hypertension: Secondary | ICD-10-CM

## 2015-11-03 DIAGNOSIS — E1121 Type 2 diabetes mellitus with diabetic nephropathy: Secondary | ICD-10-CM

## 2015-11-04 ENCOUNTER — Ambulatory Visit (INDEPENDENT_AMBULATORY_CARE_PROVIDER_SITE_OTHER): Payer: Medicare Other | Admitting: Internal Medicine

## 2015-11-04 ENCOUNTER — Other Ambulatory Visit: Payer: Medicare Other

## 2015-11-04 ENCOUNTER — Encounter: Payer: Self-pay | Admitting: Internal Medicine

## 2015-11-04 VITALS — BP 110/62 | HR 57 | Temp 97.2°F | Ht 60.0 in | Wt 148.0 lb

## 2015-11-04 DIAGNOSIS — D649 Anemia, unspecified: Secondary | ICD-10-CM

## 2015-11-04 DIAGNOSIS — H02402 Unspecified ptosis of left eyelid: Secondary | ICD-10-CM

## 2015-11-04 DIAGNOSIS — F0390 Unspecified dementia without behavioral disturbance: Secondary | ICD-10-CM | POA: Diagnosis not present

## 2015-11-04 DIAGNOSIS — I5032 Chronic diastolic (congestive) heart failure: Secondary | ICD-10-CM

## 2015-11-04 DIAGNOSIS — I10 Essential (primary) hypertension: Secondary | ICD-10-CM | POA: Diagnosis not present

## 2015-11-04 DIAGNOSIS — N183 Chronic kidney disease, stage 3 unspecified: Secondary | ICD-10-CM

## 2015-11-04 DIAGNOSIS — H02409 Unspecified ptosis of unspecified eyelid: Secondary | ICD-10-CM | POA: Insufficient documentation

## 2015-11-04 DIAGNOSIS — E1121 Type 2 diabetes mellitus with diabetic nephropathy: Secondary | ICD-10-CM

## 2015-11-04 LAB — COMPLETE METABOLIC PANEL WITH GFR
ALT: 7 U/L (ref 6–29)
AST: 14 U/L (ref 10–35)
Albumin: 3.6 g/dL (ref 3.6–5.1)
Alkaline Phosphatase: 56 U/L (ref 33–130)
BUN: 31 mg/dL — AB (ref 7–25)
CALCIUM: 8.6 mg/dL (ref 8.6–10.4)
CHLORIDE: 112 mmol/L — AB (ref 98–110)
CO2: 23 mmol/L (ref 20–31)
CREATININE: 1.42 mg/dL — AB (ref 0.60–0.88)
GFR, Est African American: 39 mL/min — ABNORMAL LOW (ref >=60)
GFR, Est Non African American: 33 mL/min — ABNORMAL LOW (ref >=60)
GLUCOSE: 134 mg/dL — AB (ref 65–99)
POTASSIUM: 4.9 mmol/L (ref 3.5–5.3)
SODIUM: 144 mmol/L (ref 135–146)
Total Bilirubin: 0.4 mg/dL (ref 0.2–1.2)
Total Protein: 5.5 g/dL — ABNORMAL LOW (ref 6.1–8.1)

## 2015-11-04 LAB — CBC WITH DIFFERENTIAL/PLATELET
Basophils Absolute: 0 {cells}/uL (ref 0–200)
Basophils Relative: 0 %
Eosinophils Absolute: 123 {cells}/uL (ref 15–500)
Eosinophils Relative: 3 %
HCT: 33.5 % — ABNORMAL LOW (ref 35.0–45.0)
Hemoglobin: 10.7 g/dL — ABNORMAL LOW (ref 11.7–15.5)
Lymphocytes Relative: 32 %
Lymphs Abs: 1312 {cells}/uL (ref 850–3900)
MCH: 29.3 pg (ref 27.0–33.0)
MCHC: 31.9 g/dL — ABNORMAL LOW (ref 32.0–36.0)
MCV: 91.8 fL (ref 80.0–100.0)
MPV: 10.6 fL (ref 7.5–12.5)
Monocytes Absolute: 328 {cells}/uL (ref 200–950)
Monocytes Relative: 8 %
Neutro Abs: 2337 {cells}/uL (ref 1500–7800)
Neutrophils Relative %: 57 %
Platelets: 200 K/uL (ref 140–400)
RBC: 3.65 MIL/uL — ABNORMAL LOW (ref 3.80–5.10)
RDW: 13.9 % (ref 11.0–15.0)
WBC: 4.1 K/uL (ref 3.8–10.8)

## 2015-11-04 NOTE — Progress Notes (Signed)
Facility  [place]    Place of Service:   OFFICE    Allergies  Allergen Reactions  . Namzaric [Memantine Hcl-Donepezil Hcl] Nausea Only    confusion  . Percocet [Oxycodone-Acetaminophen] Other (See Comments)    loosy goosy  . Sulfa Antibiotics Other (See Comments)    Tongue swells  . Adhesive [Tape] Other (See Comments)    Tears skin.  Please use "paper" tape  . Metformin And Related Other (See Comments)    Abnormal Kidney Functions   . Tradjenta [Linagliptin]     Low back ache, resolved once medication stopped   . Penicillins Hives    Tolerates Ceftriaxone, Has patient had a PCN reaction causing immediate rash, facial/tongue/throat swelling, SOB or lightheadedness with hypotension: Unknown Has patient had a PCN reaction causing severe rash involving mucus membranes or skin necrosis: No Has patient had a PCN reaction that required hospitalization No Has patient had a PCN reaction occurring within the last 10 years: No If all of the above answers are "NO", then may proceed with Cephalosporin use.     Chief Complaint  Patient presents with  . Medical Management of Chronic Issues    2 month medication management blood sugar, blood pressure, dementia, CKD    HPI:  Patient was last seen 2 months ago for review of blood pressure, hyperlipidemia, diabetes with nephropathy, dementia, CKD 3, chronic diastolic heart failure, and anemia.  She did not have her lab work done before this visit. Blood drawn today was for CBC, A1c, and CMP.  Seen 10/06/15 by Joelene Millin for cough and runny nose. Doing better.  Chronic diastolic heart failure, NYHA class 1 ([place])  compensated  Diabetes mellitus with nephropathy ([place]) - A1c has been a little high. Was 7.9 in March 2017.  Chronic kidney disease, stage III (moderate) - stable  Essential hypertension - controlled  Dementia, without behavioral disturbance - unchanged  Anemia, unspecified anemia type - stable  Ptosis, left - chronic, but  uncertain date of onset.    Medications: Patient's Medications  New Prescriptions   No medications on file  Previous Medications   CLONIDINE (CATAPRES) 0.1 MG TABLET    Take 1 tablet (0.1 mg total) by mouth daily. For high blood pressure   EZETIMIBE-SIMVASTATIN (VYTORIN) 10-20 MG TABLET    1/2 tablet nightly to control cholesterol   LOTEMAX 0.5 % GEL    One drop in the left eye twice daly to control glaucome   METFORMIN (GLUCOPHAGE) 500 MG TABLET    Take 500 mg by mouth daily.   NITROGLYCERIN (NITROSTAT) 0.4 MG SL TABLET    Place one under tongue for chest pain, up to 3 tablets   POLYETHYLENE GLYCOL POWDER (GLYCOLAX/MIRALAX) POWDER    DISSOLVE 17 GRAMS (1 CAPFUL) INTO 8 OUNCES OF LIQUID AND DRINK EVERY DAY AS NEEDED FOR BOWELS   TIMOLOL (BETIMOL) 0.5 % OPHTHALMIC SOLUTION    One drop in the left eye daily to help control glaucoma  Modified Medications   No medications on file  Discontinued Medications   BETIMOL 0.5 % OPHTHALMIC SOLUTION    INSTILL 1 DROP INTO THE LEFT EYE DAILY    Review of Systems  Constitutional: Negative for activity change, appetite change, chills, diaphoresis, fatigue, fever and unexpected weight change.  HENT: Positive for sore throat (Acute issue). Negative for congestion, ear discharge, ear pain, hearing loss, postnasal drip, rhinorrhea, tinnitus, trouble swallowing and voice change.   Eyes: Positive for visual disturbance (corrective lenses). Negative for  pain, redness and itching.       History of glaucoma and ptosis of the left eye.a  Respiratory: Negative for cough, choking, shortness of breath and wheezing.   Cardiovascular: Positive for leg swelling. Negative for chest pain and palpitations.       History of chronic diastolic heart failure  Gastrointestinal: Positive for abdominal pain and constipation. Negative for abdominal distention, anal bleeding, diarrhea, nausea and rectal pain.  Endocrine: Negative for cold intolerance, heat intolerance,  polydipsia, polyphagia and polyuria.  Genitourinary: Negative for difficulty urinating, dysuria, flank pain, frequency, hematuria, pelvic pain, urgency and vaginal discharge.       Indwelling Foley catheter. Mass at rectum following straining CKD 3  Musculoskeletal: Positive for gait problem. Negative for arthralgias, back pain, myalgias, neck pain and neck stiffness.  Skin: Negative for color change, pallor, rash and wound.  Allergic/Immunologic: Negative.   Neurological: Negative for dizziness, tremors, seizures, syncope, weakness, light-headedness, numbness and headaches.       Mild-moderate dementia  Hematological: Negative for adenopathy. Does not bruise/bleed easily.       Chronic Anemia  Psychiatric/Behavioral: Positive for confusion. Negative for agitation, behavioral problems, dysphoric mood, hallucinations, sleep disturbance and suicidal ideas. The patient is not nervous/anxious and is not hyperactive.     Vitals:   11/04/15 1047  BP: 110/62  Pulse: (!) 57  Temp: 97.2 F (36.2 C)  TempSrc: Oral  SpO2: 92%  Weight: 148 lb (67.1 kg)  Height: 5' (1.524 m)   Body mass index is 28.9 kg/m. Wt Readings from Last 3 Encounters:  11/04/15 148 lb (67.1 kg)  10/06/15 146 lb 3.2 oz (66.3 kg)  08/25/15 145 lb (65.8 kg)      Physical Exam  Constitutional: She is oriented to person, place, and time. She appears well-developed and well-nourished. No distress.  Frail elderly female, NAD  HENT:  Head: Normocephalic and atraumatic.  Right Ear: External ear normal.  Left Ear: External ear normal.  Nose: Nose normal.  Mouth/Throat: Oropharynx is clear and moist. No oropharyngeal exudate.  Eyes: Conjunctivae and EOM are normal. Pupils are equal, round, and reactive to light. No scleral icterus.  Ptosis of the left lid.  Neck: Normal range of motion. Neck supple. No JVD present. No tracheal deviation present. No thyromegaly present.  Cardiovascular: Normal rate, regular rhythm,  normal heart sounds and intact distal pulses.  Exam reveals no gallop and no friction rub.   No murmur heard. Pulmonary/Chest: Effort normal and breath sounds normal. No respiratory distress. She has no wheezes. She has no rales. She exhibits no tenderness.  Abdominal: Soft. Bowel sounds are normal. She exhibits no distension and no mass. There is no tenderness.  Genitourinary:  Genitourinary Comments: Loose anal sphincter. No palpable rectal mass. With straining, there is some mucosal eversion. Removed Foley catheter during office visit 06/24/15.  Musculoskeletal: Normal range of motion. She exhibits edema. She exhibits no tenderness.  Lymphadenopathy:    She has no cervical adenopathy.  Neurological: She is alert and oriented to person, place, and time. No cranial nerve deficit. Coordination normal.  Slow unstable gait 06/10/15 MMSE 20/30. Failed clock drawing.  Skin: Skin is warm and dry. No rash noted. She is not diaphoretic. No erythema. No pallor.  Ingrown nail of the left great toe.  Psychiatric: She has a normal mood and affect. Her behavior is normal. Judgment and thought content normal.    Labs reviewed: Lab Summary Latest Ref Rng & Units 07/02/2015 06/21/2015 06/15/2015 06/13/2015 06/12/2015  Hemoglobin 12.0 - 15.0 g/dL (None) 11.8(L) (None) 10.4(L) 11.7(L)  Hematocrit 36.0 - 46.0 % (None) 35.3(L) (None) 32.3(L) 34.8(L)  White count 4.0 - 10.5 K/uL (None) 4.4 (None) 5.2 4.4  Platelet count 150 - 400 K/uL (None) 161 (None) 156 133(L)  Sodium 135 - 145 mmol/L 144 145 143 141 138  Potassium 3.5 - 5.1 mmol/L 4.6 4.7 4.9 4.3 4.4  Calcium 8.9 - 10.3 mg/dL 9.1 9.2 8.6(L) 8.5(L) 8.7(L)  Phosphorus - (None) (None) (None) (None) (None)  Creatinine 0.44 - 1.00 mg/dL 1.57(H) 1.29(H) 1.48(H) 1.78(H) 1.56(H)  AST 15 - 41 U/L (None) 22 (None) (None) 24  Alk Phos 38 - 126 U/L (None) 59 (None) (None) 65  Bilirubin 0.3 - 1.2 mg/dL (None) 0.3 (None) (None) 0.5  Glucose 65 - 99 mg/dL 123(H) 136(H)  133(H) 140(H) 247(H)  Cholesterol - (None) (None) (None) (None) (None)  HDL cholesterol - (None) (None) (None) (None) (None)  Triglycerides - (None) (None) (None) (None) (None)  LDL Direct - (None) (None) (None) (None) (None)  LDL Calc - (None) (None) (None) (None) (None)  Total protein 6.5 - 8.1 g/dL (None) 6.3(L) (None) (None) 6.0(L)  Albumin 3.5 - 5.0 g/dL (None) 3.6 (None) (None) 3.3(L)  Some recent data might be hidden   Lab Results  Component Value Date   TSH 2.149 06/12/2015   TSH 4.25 05/01/2014   TSH 5.43 (H) 04/02/2014   Lab Results  Component Value Date   BUN 57 (H) 07/02/2015   BUN 38 (H) 06/21/2015   BUN 42 (H) 06/15/2015   Lab Results  Component Value Date   HGBA1C 7.9 (H) 05/22/2015   HGBA1C 7.5 (H) 04/02/2015   HGBA1C 7.8 (H) 03/09/2015    Assessment/Plan  1. Diabetes mellitus with nephropathy (HCC) - Hemoglobin A1c - CMP with eGFR - Comprehensive metabolic panel; Future - Hemoglobin A1c; Future - Microalbumin, urine; Future  2. Chronic kidney disease, stage III (moderate) - Hemoglobin A1c - CMP with eGFR - CBC with Differential/Platelet - Comprehensive metabolic panel; Future  3. Essential hypertension - Hemoglobin A1c - CMP with eGFR - CBC with Differential/Platelet  4. Chronic diastolic heart failure, NYHA class 1 (HCC) compensated  5. Dementia, without behavioral disturbance Stable MMSE next visit  6. Anemia, unspecified anemia type - CBC With Differential; Future  7. Ptosis, left chronic

## 2015-11-05 ENCOUNTER — Ambulatory Visit: Payer: Medicare Other | Admitting: Vascular Surgery

## 2015-11-05 ENCOUNTER — Encounter (HOSPITAL_COMMUNITY): Payer: Medicare Other

## 2015-11-05 LAB — HEMOGLOBIN A1C
HEMOGLOBIN A1C: 7.7 % — AB (ref <5.7)
MEAN PLASMA GLUCOSE: 174 mg/dL

## 2015-11-06 ENCOUNTER — Ambulatory Visit (INDEPENDENT_AMBULATORY_CARE_PROVIDER_SITE_OTHER): Payer: Medicare Other | Admitting: Nurse Practitioner

## 2015-11-06 ENCOUNTER — Encounter: Payer: Self-pay | Admitting: Nurse Practitioner

## 2015-11-06 VITALS — BP 126/78 | HR 72 | Ht 60.0 in | Wt 146.0 lb

## 2015-11-06 DIAGNOSIS — I5032 Chronic diastolic (congestive) heart failure: Secondary | ICD-10-CM

## 2015-11-06 DIAGNOSIS — I10 Essential (primary) hypertension: Secondary | ICD-10-CM

## 2015-11-06 DIAGNOSIS — I259 Chronic ischemic heart disease, unspecified: Secondary | ICD-10-CM

## 2015-11-06 DIAGNOSIS — I739 Peripheral vascular disease, unspecified: Secondary | ICD-10-CM | POA: Diagnosis not present

## 2015-11-06 NOTE — Progress Notes (Signed)
CARDIOLOGY OFFICE NOTE  Date:  11/06/2015    [name] Date of Birth: 09-07-1927 Medical Record #[id number]  PCP:  Ashley HodgkinsArthur Green, MD  Cardiologist:  [name] & [name]  Chief Complaint  Patient presents with  . Coronary Artery Disease    6 month check - seen for Dr. Excell Seltzerooper    History of Present Illness: [name] is a 82 y.o. female who presents today for a 6 month check. Seen for Dr. Excell Seltzerooper. She is a former patient of Dr. Ronnald Walters's.   She has multiple issues which include CAD, remote PCI, BMS to the RCA and DES to the DX in January of 2012. She has had longstanding diabetes, HTN, HLD, diastolic heart failure with EF 55 to 60% per cath back in 2012 but has been as low as 35%, PVD (followed by Dr. Darrick Walters), gout, CKD and chronic anemia.   I have known [name] for almost 20 years - always seems to have an issue with her social situation, her medicines or her doctors in some shape or fashion.   Seen back in November of 2014 by PCP for some ongoing issues with anxiety/BP. Has had lisinopril added back along with some [name]/[name]. Having personal issues with a live in man that she does not want to evict as well as family issues in general. She has always cared for many animals in and at her home and had lots of issues with family.   I saw her back in January of 2015. Medicines had been adjusted for her BP. Still with lots of social issues with her home situation. I saw her in May of 2015- she had seen Dr. Darrick Walters recently for a foot ulcer - managed conservatively. She ultimately underwent bilateral common iliac stenting on January 17, 2014. 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 She did have a postprocedure hematoma in the groin that did resolve. She has been seen at the wound center for intermittent debridement of her right medial malleolus ulcer.  She was placed in an Radio broadcast assistantUnna boot.   Seen in December of 2015 - off lots of her medicines - not clear as to why. BP was up. Clonidine and Norvasc were restarted. She was on a lower dose of ARB. On follow up by me in January - she was doing better. Cardiac status stable.   She came for a follow up in March of 2016- she was febrile - T of 102. Sent on to the ER - treated for UTI and possible pneumonia. I last saw her in February and she seemed to be doing ok. Had just had an admission for the flu.   Comes in today. Here alone. She says she is doing ok but "failing" she says. Not eating but gaining weight. She says she weighs 162 but this is not true. Upset about her daughter's [name]([name]) cooking and her daughter's lifestyle but doesn't want to hurt her feelings. Sees her other daughter [name] about once a week - they go play BINGO. Otherwise, she says she is very isolated. She is very down on FaxonLinda today.   She is constipated - has misplaced her Miralax. Fortunately, no chest pain and she says her breathing is good. BP ok by our check today. Upset about the "stage 3" CKD she saw on her chart.   Past Medical History:  Diagnosis Date  .  Anemia   . Carotid artery occlusion   . Carotid bruit   . Chronic kidney disease (CKD), stage III (moderate)   . Coronary artery disease    Remote PCI. Had BMS to RCA and DES stent to Diagonal in Jan. 2012   . Diastolic heart failure    EF 55 to 60% per cath in Jan 2012  . DVT (deep venous thrombosis) (HCC)   . Gout   . Hyperlipidemia   . Hypertension   . Ischemic cardiomyopathy    with prior EF of 35%  . NSTEMI (non-ST elevated myocardial infarction) [facility]) Jan 2012   with PCI to RCA and DX per Dr. Excell Walters  . Obesity   . Pneumonia 2016  . Proteinuria   . Stroke [facility]) 2013   ?  mini    . TIA (transient ischemic attack)   . Type II diabetes mellitus (HCC)    long standing    Past Surgical History:  Procedure Laterality Date  . ABDOMINAL AORTAGRAM N/A  01/17/2014   Procedure: ABDOMINAL Ronny Flurry;  Surgeon: Sherren Kerns, MD;  Location: [facility] CATH LAB;  Service: Cardiovascular;  Laterality: N/A;  . ANKLE FUSION Bilateral   . APPENDECTOMY    . CATARACT EXTRACTION, BILATERAL Bilateral   . CORONARY ANGIOPLASTY WITH STENT PLACEMENT  04/05/2010   distal RCA  . FRACTURE SURGERY Right 2005   bilateral ankles   . TONSILLECTOMY       Medications: Current Outpatient Prescriptions  Medication Sig Dispense Refill  . cloNIDine (CATAPRES) 0.1 MG tablet Take 1 tablet (0.1 mg total) by mouth daily. For high blood pressure 30 tablet 4  . ezetimibe-simvastatin (VYTORIN) 10-20 MG tablet 1/2 tablet nightly to control cholesterol 45 tablet 4  . LOTEMAX 0.5 % GEL One drop in the left eye twice daly to control glaucome 5 g 2  . metFORMIN (GLUCOPHAGE) 500 MG tablet Take 500 mg by mouth daily.    . nitroGLYCERIN (NITROSTAT) 0.4 MG SL tablet Place one under tongue for chest pain, up to 3 tablets    . polyethylene glycol powder (GLYCOLAX/MIRALAX) powder DISSOLVE 17 GRAMS (1 CAPFUL) INTO 8 OUNCES OF LIQUID AND DRINK EVERY DAY AS NEEDED FOR BOWELS 850 g 5  . timolol (BETIMOL) 0.5 % ophthalmic solution One drop in the left eye daily to help control glaucoma 10 mL 2   No current facility-administered medications for this visit.     Allergies: Allergies  Allergen Reactions  . Namzaric [Memantine Hcl-Donepezil Hcl] Nausea Only    confusion  . Percocet [Oxycodone-Acetaminophen] Other (See Comments)    loosy goosy  . Sulfa Antibiotics Other (See Comments)    Tongue swells  . Adhesive [Tape] Other (See Comments)    Tears skin.  Please use "paper" tape  . Metformin And Related Other (See Comments)    Abnormal Kidney Functions   . Tradjenta [Linagliptin]     Low back ache, resolved once medication stopped   . Penicillins Hives    Tolerates Ceftriaxone, Has patient had a PCN reaction causing immediate rash, facial/tongue/throat swelling, SOB or lightheadedness  with hypotension: Unknown Has patient had a PCN reaction causing severe rash involving mucus membranes or skin necrosis: No Has patient had a PCN reaction that required hospitalization No Has patient had a PCN reaction occurring within the last 10 years: No If all of the above answers are "NO", then may proceed with Cephalosporin use.     Social History: The patient  reports that she has quit smoking.  She has never used smokeless tobacco. She reports that she does not drink alcohol or use drugs.   Family History: The patient's family history is not on file.   Review of Systems: Please see the history of present illness.   Otherwise, the review of systems is positive for none.   All other systems are reviewed and negative.   Physical Exam: VS:  BP 126/78 (BP Location: Left Arm, Patient Position: Sitting, Cuff Size: Normal)   Pulse 72   Ht 5' (1.524 m)   Wt 146 lb (66.2 kg)   BMI 28.51 kg/m  .  BMI Body mass index is 28.51 kg/m.  Wt Readings from Last 3 Encounters:  11/06/15 146 lb (66.2 kg)  11/04/15 148 lb (67.1 kg)  10/06/15 146 lb 3.2 oz (66.3 kg)    General: Pleasant. Elderly female. Looks frail. Seems confused at times. She is in no acute distress.   HEENT: Normal.  Neck: Supple, no JVD, carotid bruits, or masses noted.  Cardiac: Regular rate and rhythm. No murmurs, rubs, or gallops. No edema.  Respiratory:  Lungs are clear to auscultation bilaterally with normal work of breathing.  GI: Soft and nontender.  MS: No deformity or atrophy. Gait and ROM intact. She does seem unsteady. Using a cane.   Skin: Warm and dry. Color is normal.  Neuro:  Strength and sensation are intact and no gross focal deficits noted.  Psych: Alert, appropriate and with normal affect.   LABORATORY DATA:  EKG:  EKG is not ordered today.   Lab Results  Component Value Date   WBC 4.1 11/04/2015   HGB 10.7 (L) 11/04/2015   HCT 33.5 (L) 11/04/2015   PLT 200 11/04/2015   GLUCOSE 134 (H)  11/04/2015   CHOL 143 03/09/2015   TRIG 170 (H) 03/09/2015   HDL 35 (L) 03/09/2015   LDLCALC 74 03/09/2015   ALT 7 11/04/2015   AST 14 11/04/2015   NA 144 11/04/2015   K 4.9 11/04/2015   CL 112 (H) 11/04/2015   CREATININE 1.42 (H) 11/04/2015   BUN 31 (H) 11/04/2015   CO2 23 11/04/2015   TSH 2.149 06/12/2015   INR 1.09 04/15/2010   HGBA1C 7.7 (H) 11/04/2015    BNP (last 3 results)  Recent Labs  04/02/15 0913 07/02/15 0757  BNP 125.4* 246.4*    ProBNP (last 3 results) No results for input(s): PROBNP in the last 8760 hours.   Other Studies Reviewed Today:   Assessment/Plan: 1. HTN - BP looks good on her current regimen. I have not made any changes today  2. CAD - no symptoms reported - will continue with medical/conservative management   3. HLD - on statin therapy  4. PVD - followed by [facility] with Dr. Darrick Penna   5. Hyperkalemia - off ARB  6. Diastolic HF - she seems compensated to me. I have left her on current regimen.  7. Dementia - not really clear to me what to believe about her home situation.    Current medicines are reviewed with the patient today.  The patient does not have concerns regarding medicines other than what has been noted above.  The following changes have been made:  See above.  Labs/ tests ordered today include:   No orders of the defined types were placed in this encounter.    Disposition:   FU with me in 6 months.   Patient is agreeable to this plan and will call if any problems develop in the interim.  Signed: Rosalio MacadamiaLori C. Awilda Covin, RN, ANP-C 11/06/2015 1:58 PM  [facility] [address] [place], [place]  [address] Phone: [phone] Fax: [phone]

## 2015-11-06 NOTE — Patient Instructions (Addendum)
We will be checking the following labs today - NONE   Medication Instructions:    Continue with your current medicines.     Testing/Procedures To Be Arranged:  N/A  Follow-Up:   See me in 6 months.    Other Special Instructions:   N/A    If you need a refill on your cardiac medications before your next appointment, please call your pharmacy.   Call the [facility] office at [phone] if you have any questions, problems or concerns.      

## 2015-11-12 IMAGING — US US PELVIS COMPLETE
1 series · 13 of 25 positions shown · non-contrast
Comparison: CT 06/04/2014.

CLINICAL DATA: Adnexal cyst follow-up.

EXAM:
TRANSABDOMINAL AND TRANSVAGINAL ULTRASOUND OF PELVIS
TECHNIQUE: Both transabdominal and transvaginal ultrasound examinations of the
pelvis were performed. Transabdominal technique was performed for
global imaging of the pelvis including uterus, ovaries, adnexal
regions, and pelvic cul-de-sac. It was necessary to proceed with
endovaginal exam following the transabdominal exam to visualize the
uterus and ovaries.

[Series 1: us pelvis complete · 0.17mm/px · 13 of 46 slices shown]
[im 1/46]
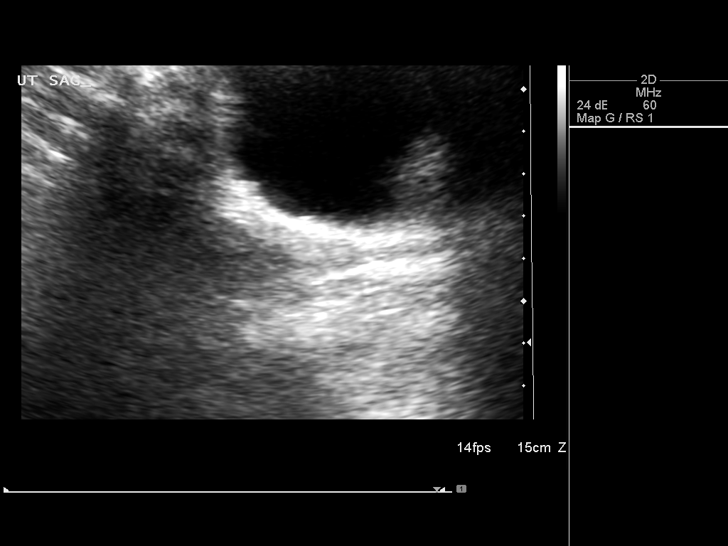
[im 4/46]
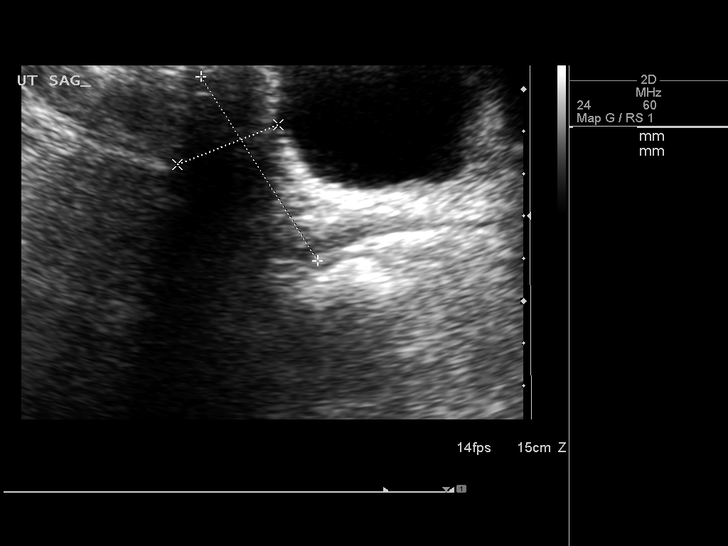
[im 8/46]
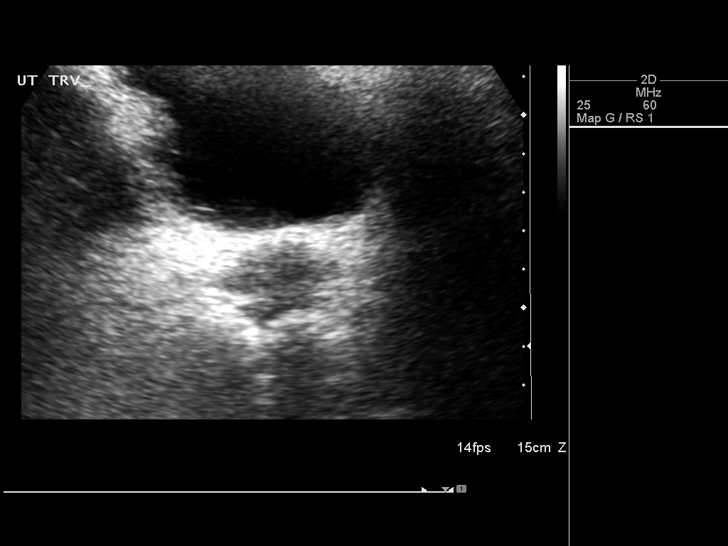
[im 12/46]
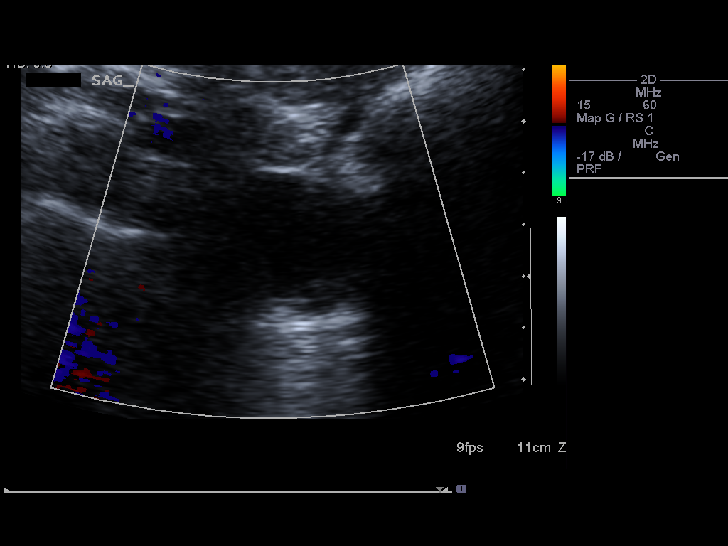
[im 16/46]
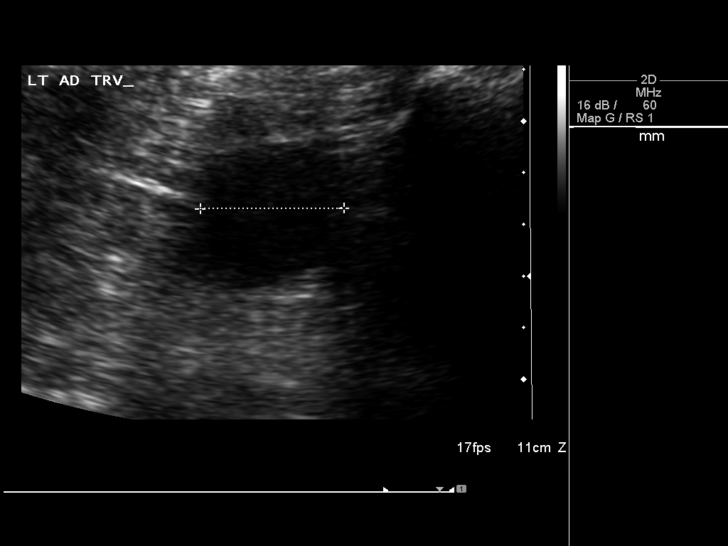
[im 19/46]
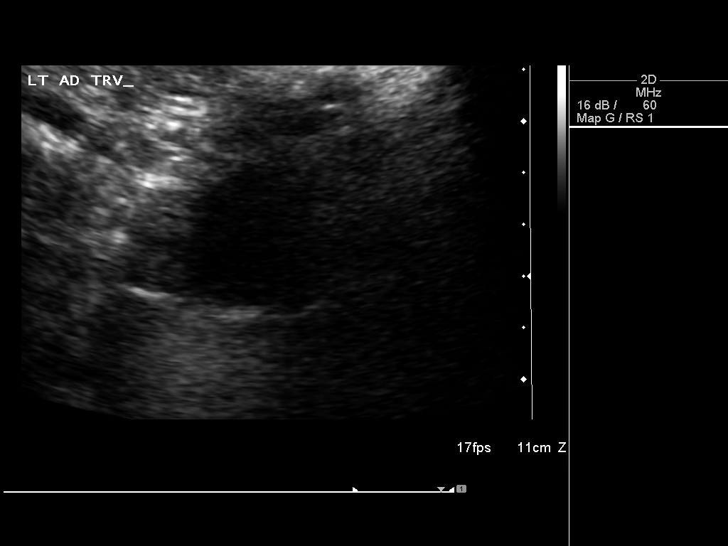
[im 23/46]
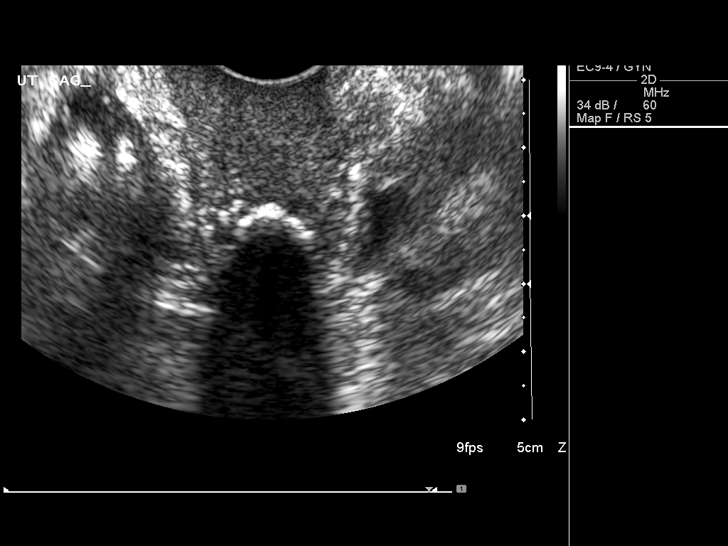
[im 27/46]
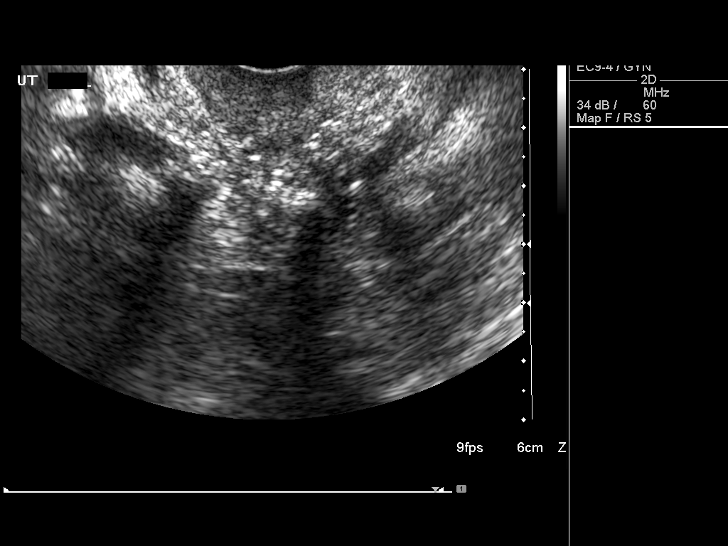
[im 31/46]
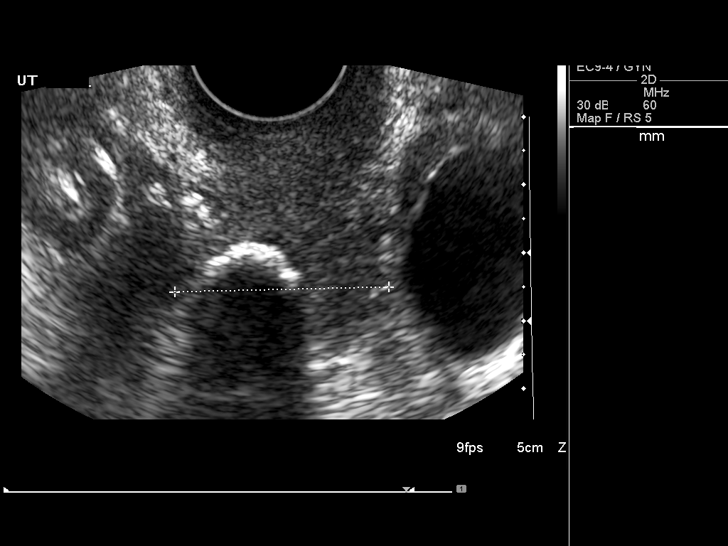
[im 34/46]
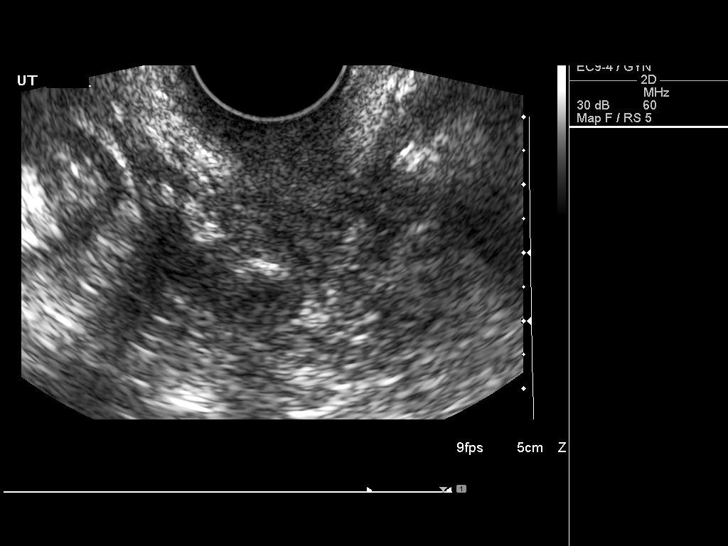
[im 38/46]
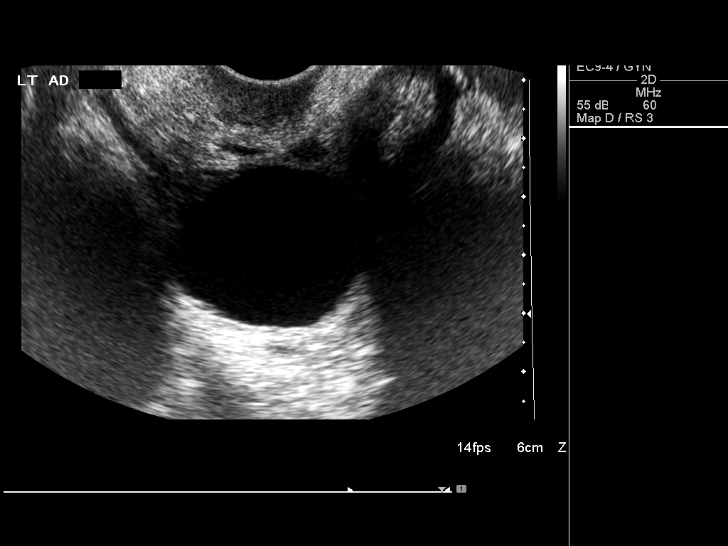
[im 42/46]
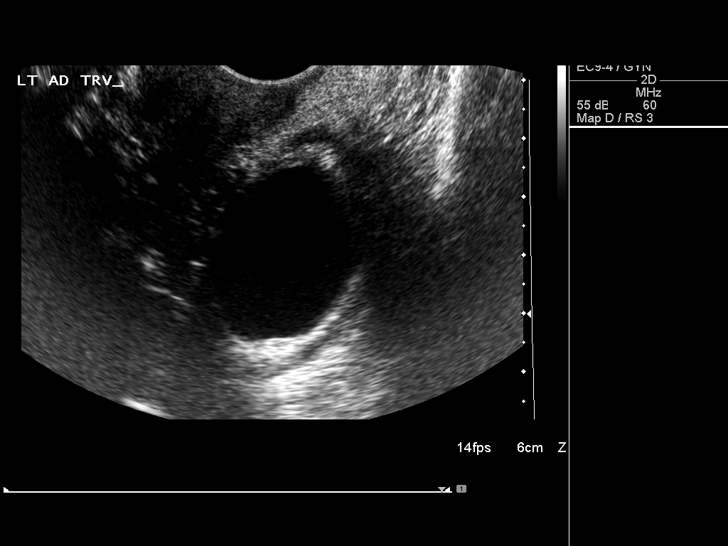
[im 46/46]
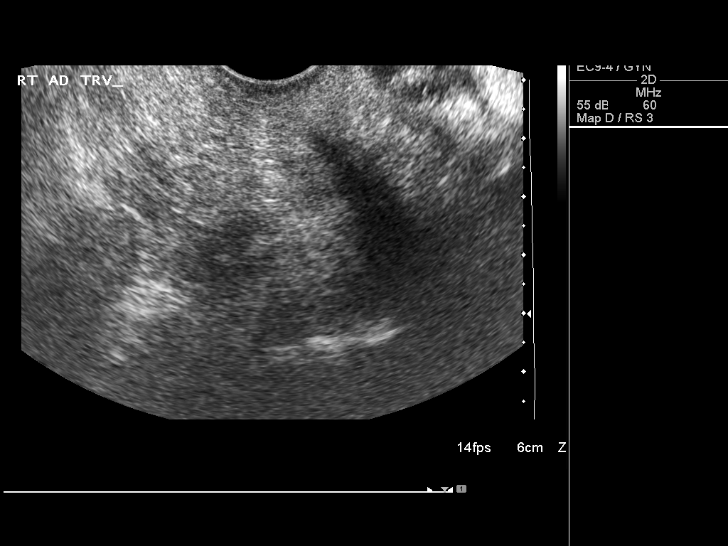

[13 of 25 positions shown; findings below may reference images not displayed]

FINDINGS: Uterus

Measurements: 5.1 x 2.6 x 3.6 cm. 1.2 cm calcified fundal fibroid
noted.

Endometrium

Not visualized secondary to calcified fibroid

Right ovary

Not visualized.

Left ovary

Not visualized. 3.0 x 2.8 x 2.7 cm simple left adnexal cyst. This
appears stable.

Other findings

No free fluid.
IMPRESSION: 1. Calcified uterine fibroid.
2. Stable 3 cm simple left adnexal cyst. This is almost certainly
benign, but follow up ultrasound is recommended in 1 year according
to the Society of Radiologists in Bltrasound5R0R Consensus
Conference Statement (Asher Arrue et al. Management of Asymptomatic
Ovarian and Other Adnexal Cysts Imaged at US: Society of
Radiologists in Ultrasound Consensus Conference Statement 3252.
Radiology [DATE]): [id number].).

## 2015-11-16 ENCOUNTER — Encounter: Payer: Self-pay | Admitting: Sports Medicine

## 2015-11-16 ENCOUNTER — Ambulatory Visit (INDEPENDENT_AMBULATORY_CARE_PROVIDER_SITE_OTHER): Payer: Medicare Other | Admitting: Podiatry

## 2015-11-16 DIAGNOSIS — L84 Corns and callosities: Secondary | ICD-10-CM | POA: Diagnosis not present

## 2015-11-16 DIAGNOSIS — B351 Tinea unguium: Secondary | ICD-10-CM

## 2015-11-16 DIAGNOSIS — E119 Type 2 diabetes mellitus without complications: Secondary | ICD-10-CM | POA: Diagnosis not present

## 2015-11-16 DIAGNOSIS — M79676 Pain in unspecified toe(s): Secondary | ICD-10-CM | POA: Diagnosis not present

## 2015-11-19 ENCOUNTER — Ambulatory Visit: Payer: Medicare Other | Admitting: Vascular Surgery

## 2015-11-19 ENCOUNTER — Encounter (HOSPITAL_COMMUNITY): Payer: Medicare Other

## 2015-11-24 NOTE — Progress Notes (Signed)
Subjective: 82 y.o. returns the office today for painful, elongated, thickened toenails which she cannot trim herself. Denies any redness or drainage around the nails. She also has a corn on the right 5th toe which is painful. Denies any acute changes since last appointment and no new complaints today. Denies any systemic complaints such as fevers, chills, nausea, vomiting.   Objective: AAO 3, NAD DP/PT pulses palpable, CRT less than 3 seconds Nails hypertrophic, dystrophic, elongated, brittle, discolored 10. There is tenderness overlying the nails 1-5 bilaterally. There is no surrounding erythema or drainage along the nail sites. Hyperkeratotic lesion right fifth toe on the medial aspect. No underlying ulceration, drainage or other signs of infection. No open lesions or pre-ulcerative lesions are identified. No other areas of tenderness bilateral lower extremities. No overlying edema, erythema, increased warmth. No pain with calf compression, swelling, warmth, erythema.  Assessment: Patient presents with symptomatic onychomycosis, hyperkeratotic lesion  Plan: -Treatment options including alternatives, risks, complications were discussed -Nails sharply debrided 10 without complication/bleeding. -Hyperkeratotic lesion debrided 1 without complications or bleeding. Offloading pads dispensed. -Discussed daily foot inspection. If there are any changes, to call the office immediately.  -Follow-up in 3 months or sooner if any problems are to arise. In the meantime, encouraged to call the office with any questions, concerns, changes symptoms.  Ovid CurdMatthew Robbin Loughmiller, DPM

## 2015-11-27 ENCOUNTER — Ambulatory Visit (INDEPENDENT_AMBULATORY_CARE_PROVIDER_SITE_OTHER): Payer: Medicare Other | Admitting: Ophthalmology

## 2015-11-27 DIAGNOSIS — H35033 Hypertensive retinopathy, bilateral: Secondary | ICD-10-CM | POA: Diagnosis not present

## 2015-11-27 DIAGNOSIS — E11319 Type 2 diabetes mellitus with unspecified diabetic retinopathy without macular edema: Secondary | ICD-10-CM | POA: Diagnosis not present

## 2015-11-27 DIAGNOSIS — I10 Essential (primary) hypertension: Secondary | ICD-10-CM | POA: Diagnosis not present

## 2015-11-27 DIAGNOSIS — E113393 Type 2 diabetes mellitus with moderate nonproliferative diabetic retinopathy without macular edema, bilateral: Secondary | ICD-10-CM

## 2015-11-27 DIAGNOSIS — H43813 Vitreous degeneration, bilateral: Secondary | ICD-10-CM | POA: Diagnosis not present

## 2015-12-01 ENCOUNTER — Other Ambulatory Visit: Payer: Self-pay | Admitting: Internal Medicine

## 2015-12-14 ENCOUNTER — Emergency Department (HOSPITAL_COMMUNITY)
Admission: EM | Admit: 2015-12-14 | Discharge: 2015-12-14 | Disposition: A | Discharge status: Home / Self Care | Payer: Medicare Other | Attending: Emergency Medicine | Admitting: Emergency Medicine

## 2015-12-14 ENCOUNTER — Emergency Department (HOSPITAL_COMMUNITY): Payer: Medicare Other

## 2015-12-14 ENCOUNTER — Encounter (HOSPITAL_COMMUNITY): Payer: Self-pay | Admitting: *Deleted

## 2015-12-14 DIAGNOSIS — K6289 Other specified diseases of anus and rectum: Secondary | ICD-10-CM | POA: Diagnosis present

## 2015-12-14 DIAGNOSIS — E1122 Type 2 diabetes mellitus with diabetic chronic kidney disease: Secondary | ICD-10-CM | POA: Insufficient documentation

## 2015-12-14 DIAGNOSIS — Z87891 Personal history of nicotine dependence: Secondary | ICD-10-CM | POA: Diagnosis not present

## 2015-12-14 DIAGNOSIS — I2510 Atherosclerotic heart disease of native coronary artery without angina pectoris: Secondary | ICD-10-CM | POA: Insufficient documentation

## 2015-12-14 DIAGNOSIS — N183 Chronic kidney disease, stage 3 (moderate): Secondary | ICD-10-CM | POA: Insufficient documentation

## 2015-12-14 DIAGNOSIS — Z7984 Long term (current) use of oral hypoglycemic drugs: Secondary | ICD-10-CM | POA: Diagnosis not present

## 2015-12-14 DIAGNOSIS — I5032 Chronic diastolic (congestive) heart failure: Secondary | ICD-10-CM | POA: Insufficient documentation

## 2015-12-14 DIAGNOSIS — Z955 Presence of coronary angioplasty implant and graft: Secondary | ICD-10-CM | POA: Insufficient documentation

## 2015-12-14 DIAGNOSIS — K5900 Constipation, unspecified: Secondary | ICD-10-CM

## 2015-12-14 DIAGNOSIS — I130 Hypertensive heart and chronic kidney disease with heart failure and stage 1 through stage 4 chronic kidney disease, or unspecified chronic kidney disease: Secondary | ICD-10-CM | POA: Diagnosis not present

## 2015-12-14 DIAGNOSIS — K649 Unspecified hemorrhoids: Secondary | ICD-10-CM | POA: Insufficient documentation

## 2015-12-14 DIAGNOSIS — Z79899 Other long term (current) drug therapy: Secondary | ICD-10-CM | POA: Insufficient documentation

## 2015-12-14 LAB — CBC WITH DIFFERENTIAL/PLATELET
BASOS PCT: 0 %
Basophils Absolute: 0 10*3/uL (ref 0.0–0.1)
EOS ABS: 0.1 10*3/uL (ref 0.0–0.7)
EOS PCT: 3 %
HCT: 35.8 % — ABNORMAL LOW (ref 36.0–46.0)
Hemoglobin: 11.8 g/dL — ABNORMAL LOW (ref 12.0–15.0)
LYMPHS ABS: 1.5 10*3/uL (ref 0.7–4.0)
Lymphocytes Relative: 29 %
MCH: 29.9 pg (ref 26.0–34.0)
MCHC: 33 g/dL (ref 30.0–36.0)
MCV: 90.6 fL (ref 78.0–100.0)
MONO ABS: 0.4 10*3/uL (ref 0.1–1.0)
MONOS PCT: 8 %
Neutro Abs: 3.1 10*3/uL (ref 1.7–7.7)
Neutrophils Relative %: 60 %
PLATELETS: UNDETERMINED 10*3/uL (ref 150–400)
RBC: 3.95 MIL/uL (ref 3.87–5.11)
RDW: 13.7 % (ref 11.5–15.5)
WBC: 5.1 10*3/uL (ref 4.0–10.5)

## 2015-12-14 LAB — BASIC METABOLIC PANEL
Anion gap: 10 (ref 5–15)
BUN: 46 mg/dL — AB (ref 6–20)
CALCIUM: 8.8 mg/dL — AB (ref 8.9–10.3)
CHLORIDE: 113 mmol/L — AB (ref 101–111)
CO2: 22 mmol/L (ref 22–32)
CREATININE: 1.65 mg/dL — AB (ref 0.44–1.00)
GFR calc non Af Amer: 27 mL/min — ABNORMAL LOW (ref >60)
GFR, EST AFRICAN AMERICAN: 31 mL/min — AB (ref >60)
Glucose, Bld: 123 mg/dL — ABNORMAL HIGH (ref 65–99)
Potassium: 4.9 mmol/L (ref 3.5–5.1)
Sodium: 145 mmol/L (ref 135–145)

## 2015-12-14 LAB — URINE MICROSCOPIC-ADD ON

## 2015-12-14 LAB — URINALYSIS, ROUTINE W REFLEX MICROSCOPIC
BILIRUBIN URINE: NEGATIVE
Glucose, UA: NEGATIVE mg/dL
KETONES UR: NEGATIVE mg/dL
NITRITE: NEGATIVE
PROTEIN: 100 mg/dL — AB
SPECIFIC GRAVITY, URINE: 1.014 (ref 1.005–1.030)
pH: 5.5 (ref 5.0–8.0)

## 2015-12-14 NOTE — ED Provider Notes (Signed)
[facility]-EMERGENCY DEPT Provider Note   CSN: [id number] Arrival date & time: 12/14/15  1702     History   Chief Complaint Chief Complaint  Patient presents with  . Rectal Pain    HPI [name] is a 82 y.o. female.  Patient is a 82 year old Caucasian female past medical history significant for anemia, CKD, htn, HF, rectal prolapse that presents to the ED today by EMS from home with complaint of rectal pain. Patient states that she has a history of prolapsed rectum and has been having to place it back into place for the past 1 week. Patient also complains of hematochezia for the past 5 days. She states when she wipes with toilet paper there is blood on the paper. She has tried nothing for the pain. Sitting makes the pain worse. She also endorses constipation for the past 4 days and history of same. She has tried nothing for the constipation. She denies any fever, chills, headache, vision changes, chest pain, shortness of breath, abdominal pain, urinary symptoms, diarrhea.       Past Medical History:  Diagnosis Date  . Anemia   . Carotid artery occlusion   . Carotid bruit   . Chronic kidney disease (CKD), stage III (moderate)   . Coronary artery disease    Remote PCI. Had BMS to RCA and DES stent to Diagonal in Jan. 2012   . Diastolic heart failure    EF 55 to 60% per cath in Jan 2012  . DVT (deep venous thrombosis) (HCC)   . Gout   . Hyperlipidemia   . Hypertension   . Ischemic cardiomyopathy    with prior EF of 35%  . NSTEMI (non-ST elevated myocardial infarction) [facility]) Jan 2012   with PCI to RCA and DX per Dr. Excell Seltzer  . Obesity   . Pneumonia 2016  . Proteinuria   . Stroke [facility]) 2013   ?  mini    . TIA (transient ischemic attack)   . Type II diabetes mellitus (HCC)    long standing    Patient Active Problem List   Diagnosis Date Noted  . Ptosis 11/04/2015  . Glaucoma 08/25/2015  . Dementia 06/10/2015  . Expressive aphasia 05/26/2015  . Anal prolapse  02/25/2015  . Anemia 02/24/2015  . Chronic kidney disease, stage III (moderate) 02/10/2014  . PAD (peripheral artery disease) (HCC) 01/17/2014  . Constipation 07/31/2013  . Atherosclerosis of native arteries of the extremities with intermittent claudication-Right Leg 07/04/2013  . Anxiety disorder 02/21/2013  . Diabetes mellitus with nephropathy (HCC) 07/05/2012  . Occlusion and stenosis of carotid artery without mention of cerebral infarction 06/21/2012  . Coronary artery disease   . Chronic diastolic heart failure, NYHA class 1 (HCC)   . Hyperlipidemia   . Hypertension   . Carotid bruit   . Gout   . Obesity   . TIA (transient ischemic attack)     Past Surgical History:  Procedure Laterality Date  . ABDOMINAL AORTAGRAM N/A 01/17/2014   Procedure: ABDOMINAL Ronny Flurry;  Surgeon: Sherren Kerns, MD;  Location: [facility] CATH LAB;  Service: Cardiovascular;  Laterality: N/A;  . ANKLE FUSION Bilateral   . APPENDECTOMY    . CATARACT EXTRACTION, BILATERAL Bilateral   . CORONARY ANGIOPLASTY WITH STENT PLACEMENT  04/05/2010   distal RCA  . FRACTURE SURGERY Right 2005   bilateral ankles   . TONSILLECTOMY      OB History    No data available       Home  Medications    Prior to Admission medications   Medication Sig Start Date End Date Taking? Authorizing Provider  cloNIDine (CATAPRES) 0.1 MG tablet Take 1 tablet (0.1 mg total) by mouth daily. For high blood pressure 09/04/14  Yes Sharon SellerJessica K Eubanks, NP  ezetimibe-simvastatin (VYTORIN) 10-20 MG tablet 1/2 tablet nightly to control cholesterol 05/26/15  Yes Kimber RelicArthur G Green, MD  losartan (COZAAR) 50 MG tablet Take 50 mg by mouth daily.  11/26/15  Yes Historical Provider, MD  LOTEMAX 0.5 % GEL One drop in the left eye twice daly to control glaucome 08/25/15  Yes Kimber RelicArthur G Green, MD  metFORMIN (GLUCOPHAGE) 500 MG tablet Take 500 mg by mouth daily. 06/10/15  Yes Historical Provider, MD  timolol (BETIMOL) 0.5 % ophthalmic solution One drop in the left  eye daily to help control glaucoma 05/26/15  Yes Kimber RelicArthur G Green, MD  nitroGLYCERIN (NITROSTAT) 0.4 MG SL tablet Place one under tongue for chest pain, up to 3 tablets 10/22/15   Historical Provider, MD  polyethylene glycol powder (GLYCOLAX/MIRALAX) powder DISSOLVE 17 GRAMS (1 CAPFUL) INTO 8 OUNCES OF LIQUID AND DRINK EVERY DAY AS NEEDED FOR BOWELS 05/18/15   Kimber RelicArthur G Green, MD  VYTORIN 10-20 MG tablet TAKE 1/2 TABLET BY MOUTH EVERY NIGHT AT BEDTIME. Patient not taking: Reported on 12/14/2015 12/02/15   Kimber RelicArthur G Green, MD    Family History Family History  Problem Relation Age of Onset  . Heart disease Neg Hx     both parents were murdered    Social History Social History  Substance Use Topics  . Smoking status: Former Games developermoker  . Smokeless tobacco: Never Used     Comment: "smoked in my teens; quit after 1 year or so"  . Alcohol use No     Allergies   Namzaric [memantine hcl-donepezil hcl]; Percocet [oxycodone-acetaminophen]; Sulfa antibiotics; Adhesive [tape]; Metformin and related; Tradjenta [linagliptin]; and Penicillins   Review of Systems Review of Systems  Constitutional: Negative for chills and fever.  HENT: Negative for ear pain and sore throat.   Eyes: Negative for pain and visual disturbance.  Respiratory: Negative for cough and shortness of breath.   Cardiovascular: Negative for chest pain and palpitations.  Gastrointestinal: Positive for blood in stool and rectal pain. Negative for abdominal pain, nausea and vomiting.  Genitourinary: Negative for dysuria, frequency, hematuria and urgency.  Musculoskeletal: Negative for arthralgias and back pain.  Skin: Negative for color change and rash.  Neurological: Negative for dizziness, syncope, weakness, light-headedness and headaches.  All other systems reviewed and are negative.    Physical Exam Updated Vital Signs BP 148/59   Pulse 70   Temp 98.4 F (36.9 C) (Oral)   Resp 19   SpO2 92%   Physical Exam  Constitutional:  She appears well-developed and well-nourished. No distress.  HENT:  Head: Normocephalic and atraumatic.  Mouth/Throat: Oropharynx is clear and moist and mucous membranes are normal. Mucous membranes are not pale.  Eyes: Conjunctivae are normal. Pupils are equal, round, and reactive to light. Right eye exhibits no discharge. Left eye exhibits no discharge. No scleral icterus.  Neck: Normal range of motion. Neck supple. No thyromegaly present.  Cardiovascular: Normal rate, regular rhythm, normal heart sounds and intact distal pulses.   Pulmonary/Chest: Effort normal and breath sounds normal.  Abdominal: Soft. Bowel sounds are normal. She exhibits no distension. There is no tenderness. There is no rebound.  Genitourinary: Rectal exam shows external hemorrhoid (TTP but no signs of infection or thrombosed) and tenderness.  Rectal exam shows no internal hemorrhoid, no mass and anal tone normal.  Genitourinary Comments: Chaperon present for exam. No signs of prolapse or abcess. External hemorrhoid noted. No signs of blood on rectum.   Musculoskeletal: Normal range of motion.  Lymphadenopathy:    She has no cervical adenopathy.  Neurological: She is alert.  Skin: Skin is warm and dry. Capillary refill takes less than 2 seconds. There is pallor.  Vitals reviewed.    ED Treatments / Results  Labs (all labs ordered are listed, but only abnormal results are displayed) Labs Reviewed  URINE CULTURE - Abnormal; Notable for the following:       Result Value   Culture MULTIPLE SPECIES PRESENT, SUGGEST RECOLLECTION (*)    All other components within normal limits  BASIC METABOLIC PANEL - Abnormal; Notable for the following:    Chloride 113 (*)    Glucose, Bld 123 (*)    BUN 46 (*)    Creatinine, Ser 1.65 (*)    Calcium 8.8 (*)    GFR calc non Af Amer 27 (*)    GFR calc Af Amer 31 (*)    All other components within normal limits  CBC WITH DIFFERENTIAL/PLATELET - Abnormal; Notable for the following:     Hemoglobin 11.8 (*)    HCT 35.8 (*)    All other components within normal limits  URINALYSIS, ROUTINE W REFLEX MICROSCOPIC (NOT AT [facility]) - Abnormal; Notable for the following:    APPearance CLOUDY (*)    Hgb urine dipstick TRACE (*)    Protein, ur 100 (*)    Leukocytes, UA TRACE (*)    All other components within normal limits  URINE MICROSCOPIC-ADD ON - Abnormal; Notable for the following:    Squamous Epithelial / LPF 0-5 (*)    Bacteria, UA RARE (*)    All other components within normal limits    EKG  EKG Interpretation None       Radiology Dg Abdomen 1 View  Result Date: 12/14/2015 CLINICAL DATA:  Recurrent rectal prolapse. Constipation. Initial encounter. EXAM: ABDOMEN - 1 VIEW COMPARISON:  CT of the abdomen and pelvis performed 06/04/2014 FINDINGS: The visualized bowel gas pattern is unremarkable. Scattered air and stool filled loops of colon are seen; no abnormal dilatation of small bowel loops is seen to suggest small bowel obstruction. No free intra-abdominal air is identified, though evaluation for free air is limited on a single supine view. The visualized osseous structures are within normal limits; the sacroiliac joints are unremarkable in appearance. An aortoiliac stent is grossly unremarkable in appearance. A calcification is noted within the pelvis, reflecting a small uterine fibroid. IMPRESSION: Unremarkable bowel gas pattern; no free intra-abdominal air seen. Moderate to large amount of stool noted in the colon, concerning for constipation. The rectum may be filled with stool, but is difficult to fully assess due to the overlying bladder. Electronically Signed   By: Roanna Raider M.D.   On: 12/14/2015 19:25    Procedures Procedures (including critical care time)  Medications Ordered in ED Medications - No data to display   Initial Impression / Assessment and Plan / ED Course  I have reviewed the triage vital signs and the nursing notes.  Pertinent labs &  imaging results that were available during my care of the patient were reviewed by me and considered in my medical decision making (see chart for details).  Clinical Course  Patient presents to the ED with rectal pain and constipation. Patient with history  of prolapse rectum. She was told by her surgeon she made need surgery. Exam does not show prolapsed rectum. Hemorrhoids noted. No impaction noted. Hgb is stable. It was 11.8 up from 10.7 which is baseline for patient. Low concern for acute lower GI bleed. She has seen blood on toilet paper for the past 5 days but hgb stable. Xray shows constipation. Patient states she has miralax at home. Encouraged to increase to two tablespoon a day and add a stool softener. Patient also has history of CKD. Creatine is at baseline. Trace amount of blood and protein in urine. On further review of past UA these have been present. Patient informed and instructed to follow up with her PCP this week for recheck of kidney function and UA. Urine culture sent. No urinary symptoms. She also plans to make appointment with her GI doctor this week for further eval of rectal pain. Patient is hemodynamically stable. Patient verbalizes understanding with plan of care. Strict return precautions given. Dr. Rush Landmark saw and examined patient and agrees with plan. Patient will be discharged home in NAD with stable vs.   Final Clinical Impressions(s) / ED Diagnoses   Final diagnoses:  Constipation, unspecified constipation type  Rectal pain  Hemorrhoids, unspecified hemorrhoid type    New Prescriptions New Prescriptions   No medications on file     Rise Mu, PA-C 12/16/15 0944    Canary Brim Tegeler, MD 12/16/15 [phone]

## 2015-12-14 NOTE — ED Notes (Signed)
Pt used the restroom while she was taken to radiology, no sample was collected.

## 2015-12-14 NOTE — ED Triage Notes (Signed)
Per EMS - patient comes from home with c/o rectal pain.  Patient states she has a prolapsed rectum and states she has been "shoving it back in for days."  Patient also c/o constipation x4 days.  Patient denies N/V/D and fever. Patient's vitals WNL - 142/78, HR 55, CBG 158.

## 2015-12-14 NOTE — Discharge Instructions (Addendum)
Please take the MiraLAX to have at home. He may take 2 tablespoons a day. Also try an over-the-counter stool softener. Follow-up with your primary doctor this week to have her urine rechecked to make sure that you're blood and protein resolved. Also follow up with your GI doctor to have your hemorrhoids recheck. Please return to the ER if symptoms worsen.

## 2015-12-16 LAB — URINE CULTURE

## 2015-12-21 ENCOUNTER — Ambulatory Visit (INDEPENDENT_AMBULATORY_CARE_PROVIDER_SITE_OTHER): Payer: Medicare Other | Admitting: Nurse Practitioner

## 2015-12-21 ENCOUNTER — Encounter: Payer: Self-pay | Admitting: Nurse Practitioner

## 2015-12-21 VITALS — BP 132/68 | HR 64 | Temp 98.2°F | Resp 17 | Ht 60.0 in | Wt 145.4 lb

## 2015-12-21 DIAGNOSIS — K5900 Constipation, unspecified: Secondary | ICD-10-CM

## 2015-12-21 DIAGNOSIS — Z23 Encounter for immunization: Secondary | ICD-10-CM

## 2015-12-21 DIAGNOSIS — K622 Anal prolapse: Secondary | ICD-10-CM | POA: Diagnosis not present

## 2015-12-21 LAB — CBC WITH DIFFERENTIAL/PLATELET
BASOS ABS: 0 {cells}/uL (ref 0–200)
Basophils Relative: 0 %
EOS ABS: 180 {cells}/uL (ref 15–500)
Eosinophils Relative: 4 %
HEMATOCRIT: 34.3 % — AB (ref 35.0–45.0)
HEMOGLOBIN: 11.3 g/dL — AB (ref 11.7–15.5)
LYMPHS ABS: 1980 {cells}/uL (ref 850–3900)
Lymphocytes Relative: 44 %
MCH: 30.1 pg (ref 27.0–33.0)
MCHC: 32.9 g/dL (ref 32.0–36.0)
MCV: 91.5 fL (ref 80.0–100.0)
MPV: 10.8 fL (ref 7.5–12.5)
Monocytes Absolute: 360 cells/uL (ref 200–950)
Monocytes Relative: 8 %
NEUTROS ABS: 1980 {cells}/uL (ref 1500–7800)
NEUTROS PCT: 44 %
Platelets: 183 10*3/uL (ref 140–400)
RBC: 3.75 MIL/uL — ABNORMAL LOW (ref 3.80–5.10)
RDW: 14.2 % (ref 11.0–15.0)
WBC: 4.5 10*3/uL (ref 3.8–10.8)

## 2015-12-21 NOTE — Patient Instructions (Signed)
Will get blood work today   Keep follow up appts as scheduled

## 2015-12-21 NOTE — Progress Notes (Signed)
Careteam: Patient Care Team: Kimber Relic, MD as PCP - General (Internal Medicine) Rosalio Macadamia, NP as Nurse Practitioner (Cardiology) Sherrie George, MD as Consulting Physician (Ophthalmology)  Advanced Directive information Does patient have an advance directive?: Yes, Type of Advance Directive: Healthcare Power of [place];Living will  Allergies  Allergen Reactions  . Namzaric [Memantine Hcl-Donepezil Hcl] Nausea Only    confusion  . Percocet [Oxycodone-Acetaminophen] Other (See Comments)    loosy goosy  . Sulfa Antibiotics Other (See Comments)    Tongue swells  . Adhesive [Tape] Other (See Comments)    Tears skin.  Please use "paper" tape  . Metformin And Related Other (See Comments)    Abnormal Kidney Functions   . Tradjenta [Linagliptin]     Low back ache, resolved once medication stopped   . Penicillins Hives    Tolerates Ceftriaxone, Has patient had a PCN reaction causing immediate rash, facial/tongue/throat swelling, SOB or lightheadedness with hypotension: Unknown Has patient had a PCN reaction causing severe rash involving mucus membranes or skin necrosis: No Has patient had a PCN reaction that required hospitalization No Has patient had a PCN reaction occurring within the last 10 years: No If all of the above answers are "NO", then may proceed with Cephalosporin use.     Chief Complaint  Patient presents with  . Other    TOC ED follow up from 12/14/15  . Other    Wants flu vaccine today     HPI: Patient is a 82 y.o. female seen in the office today to follow up ED visit. Pt went to ED with rectal pain in the setting of intermittent rectal prolapse and constipation. According to patient, she continued to have rectal pain when her rectum prolapse is. She denies any symptoms of infection including no fevers, chills, cough, dysuria, diarrhea. Patient does have constipation and reports taking some MiraLAX. Patient had screening laboratory testing and x-ray  showing stool burden. Patient likely had constipation causing increased bowel pressure leading to prolapse. Patient was not prolapsed in ED.  Patient was instructed to increase bowel regimen to decrease her constipation.  Unsure what she is taking to help her bowels move but reports they are moving.  Taking miralax along with pills she picked up at the drug store.  Everyday she is having a stool with small "pellets" in it Reports she is much better now. Not having pain.  Reports she has seen a small amount of blood in her stool Urine done in ED,UA abnormal, culture with multiple organism, pt not having burning, abdominal discomfort, or fever  Meeting with a surgery next week for the rectal prolapse. Reports she had an appt prior but rescheduled due to too many people in the waiting room  Review of Systems:  Review of Systems  Constitutional: Negative for activity change, appetite change, chills, diaphoresis, fatigue, fever and unexpected weight change.  HENT: Negative for congestion, ear discharge, ear pain, hearing loss, postnasal drip, rhinorrhea, sore throat, tinnitus, trouble swallowing and voice change.   Eyes: Positive for visual disturbance (corrective lenses). Negative for pain, redness and itching.       History of glaucoma and ptosis of the left eye.a  Respiratory: Negative for cough, choking, shortness of breath and wheezing.   Cardiovascular: Positive for leg swelling. Negative for chest pain and palpitations.       History of chronic diastolic heart failure  Gastrointestinal: Positive for constipation. Negative for abdominal distention, abdominal pain, anal bleeding, diarrhea, nausea  and rectal pain.  Endocrine: Negative for cold intolerance, heat intolerance, polydipsia, polyphagia and polyuria.  Genitourinary: Negative for difficulty urinating, dysuria, flank pain, frequency, hematuria, pelvic pain, urgency and vaginal discharge.        CKD 3  Musculoskeletal: Positive for  gait problem. Negative for arthralgias, back pain, myalgias, neck pain and neck stiffness.  Skin: Negative for color change, pallor, rash and wound.  Allergic/Immunologic: Negative.   Neurological: Negative for dizziness, tremors, seizures, syncope, weakness, light-headedness, numbness and headaches.       Mild-moderate dementia  Hematological: Negative for adenopathy. Does not bruise/bleed easily.       Chronic Anemia  Psychiatric/Behavioral: Positive for confusion. Negative for agitation, behavioral problems, dysphoric mood, hallucinations, sleep disturbance and suicidal ideas. The patient is not nervous/anxious and is not hyperactive.     Past Medical History:  Diagnosis Date  . Anemia   . Carotid artery occlusion   . Carotid bruit   . Chronic kidney disease (CKD), stage III (moderate)   . Coronary artery disease    Remote PCI. Had BMS to RCA and DES stent to Diagonal in Jan. 2012   . Diastolic heart failure    EF 55 to 60% per cath in Jan 2012  . DVT (deep venous thrombosis) (HCC)   . Gout   . Hyperlipidemia   . Hypertension   . Ischemic cardiomyopathy    with prior EF of 35%  . NSTEMI (non-ST elevated myocardial infarction) [facility]) Jan 2012   with PCI to RCA and DX per Dr. Excell Seltzer  . Obesity   . Pneumonia 2016  . Proteinuria   . Stroke [facility]) 2013   ?  mini    . TIA (transient ischemic attack)   . Type II diabetes mellitus (HCC)    long standing   Past Surgical History:  Procedure Laterality Date  . ABDOMINAL AORTAGRAM N/A 01/17/2014   Procedure: ABDOMINAL Ronny Flurry;  Surgeon: Sherren Kerns, MD;  Location: [facility] CATH LAB;  Service: Cardiovascular;  Laterality: N/A;  . ANKLE FUSION Bilateral   . APPENDECTOMY    . CATARACT EXTRACTION, BILATERAL Bilateral   . CORONARY ANGIOPLASTY WITH STENT PLACEMENT  04/05/2010   distal RCA  . FRACTURE SURGERY Right 2005   bilateral ankles   . TONSILLECTOMY     Social History:   reports that she has quit smoking. She has never used  smokeless tobacco. She reports that she does not drink alcohol or use drugs.  Family History  Problem Relation Age of Onset  . Heart disease Neg Hx     both parents were murdered    Medications: Patient's Medications  New Prescriptions   No medications on file  Previous Medications   CLONIDINE (CATAPRES) 0.1 MG TABLET    Take 1 tablet (0.1 mg total) by mouth daily. For high blood pressure   EZETIMIBE-SIMVASTATIN (VYTORIN) 10-20 MG TABLET    1/2 tablet nightly to control cholesterol   LOSARTAN (COZAAR) 50 MG TABLET    Take 50 mg by mouth daily.    LOTEMAX 0.5 % GEL    One drop in the left eye twice daly to control glaucome   METFORMIN (GLUCOPHAGE) 500 MG TABLET    Take 500 mg by mouth daily.   NITROGLYCERIN (NITROSTAT) 0.4 MG SL TABLET    Place one under tongue for chest pain, up to 3 tablets   POLYETHYLENE GLYCOL POWDER (GLYCOLAX/MIRALAX) POWDER    DISSOLVE 17 GRAMS (1 CAPFUL) INTO 8 OUNCES OF LIQUID AND DRINK  EVERY DAY AS NEEDED FOR BOWELS   TIMOLOL (BETIMOL) 0.5 % OPHTHALMIC SOLUTION    One drop in the left eye daily to help control glaucoma   VYTORIN 10-20 MG TABLET    TAKE 1/2 TABLET BY MOUTH EVERY NIGHT AT BEDTIME.  Modified Medications   No medications on file  Discontinued Medications   No medications on file     Physical Exam:  Vitals:   12/21/15 1012  BP: 132/68  Pulse: 64  Resp: 17  Temp: 98.2 F (36.8 C)  TempSrc: Oral  SpO2: 93%  Weight: 145 lb 6.4 oz (66 kg)  Height: 5' (1.524 m)   Body mass index is 28.4 kg/m.  Physical Exam  Constitutional: She is oriented to person, place, and time. She appears well-developed and well-nourished. No distress.  Frail elderly female, NAD  Eyes: Conjunctivae are normal. Pupils are equal, round, and reactive to light.  Ptosis of the left lid.  Neck: Normal range of motion. Neck supple.  Cardiovascular: Normal rate, regular rhythm, normal heart sounds and intact distal pulses.  Exam reveals no gallop and no friction rub.    No murmur heard. Pulmonary/Chest: Effort normal and breath sounds normal. No respiratory distress.  Abdominal: Soft. Bowel sounds are normal. She exhibits no distension and no mass. There is no tenderness. There is no rebound and no guarding.  Genitourinary: Rectal exam shows guaiac negative stool.  Genitourinary Comments: Loose anal sphincter. No palpable rectal mass.   Musculoskeletal: Normal range of motion. She exhibits no edema or tenderness.  Neurological: She is alert and oriented to person, place, and time.  Slow unstable gait 06/10/15 MMSE 20/30. Failed clock drawing.  Skin: Skin is warm and dry. She is not diaphoretic.  Psychiatric: She has a normal mood and affect. Her behavior is normal. Judgment and thought content normal.    Labs reviewed: Basic Metabolic Panel:  Recent Labs  11/91/4703/24/17 1617  07/02/15 0757 11/04/15 1003 12/14/15 1834  NA  --   < > 144 144 145  K  --   < > 4.6 4.9 4.9  CL  --   < > 112* 112* 113*  CO2  --   < > 23 23 22   GLUCOSE  --   < > 123* 134* 123*  BUN  --   < > 57* 31* 46*  CREATININE  --   < > 1.57* 1.42* 1.65*  CALCIUM  --   < > 9.1 8.6 8.8*  TSH 2.149  --   --   --   --   < > = values in this interval not displayed. Liver Function Tests:  Recent Labs  06/12/15 1355 06/21/15 1426 11/04/15 1003  AST 24 22 14   ALT 18 19 7   ALKPHOS 65 59 56  BILITOT 0.5 0.3 0.4  PROT 6.0* 6.3* 5.5*  ALBUMIN 3.3* 3.6 3.6    Recent Labs  06/12/15 1355 06/21/15 1426  LIPASE 44 24   No results for input(s): AMMONIA in the last 8760 hours. CBC:  Recent Labs  06/21/15 1427 11/04/15 1003 12/14/15 1834  WBC 4.4 4.1 5.1  NEUTROABS 2.3 2,337 3.1  HGB 11.8* 10.7* 11.8*  HCT 35.3* 33.5* 35.8*  MCV 88.9 91.8 90.6  PLT 161 200 PLATELET CLUMPS NOTED ON SMEAR, UNABLE TO ESTIMATE   Lipid Panel:  Recent Labs  03/09/15 1129  CHOL 143  HDL 35*  LDLCALC 74  TRIG 829170*  CHOLHDL 4.1   TSH:  Recent Labs  06/12/15 1617  TSH 2.149    A1C: Lab Results  Component Value Date   HGBA1C 7.7 (H) 11/04/2015     Assessment/Plan 1. Constipation, unspecified constipation type -has improved with colace (medication per daughter) and miralax daily. Cont to encourage proper hydration and medication. To bring bottles with her to next visit so we can add to medication list.   - follow up CBC with Differential/Platelets due to reports of blood in stool, hemoccult was negative   2. Anal prolapse -no prolapse at this time, pt following with surgery regarding this.  - CBC with Differential/Platelets  Keep follow up as scheduled with Dr Michaelene Song. Biagio Borg  [facility] [phone] 8 am - 5 pm) [phone] (after hours)

## 2015-12-22 ENCOUNTER — Telehealth: Payer: Self-pay

## 2015-12-22 NOTE — Telephone Encounter (Signed)
Spoke with patient's daughter, Arna Mediciora, about setting up a fasting lab appointment prior to her office visit on 01/20/16.  Fasting lab appointment was scheduled for 01/14/16.

## 2016-01-07 ENCOUNTER — Ambulatory Visit: Payer: Medicare Other | Admitting: Vascular Surgery

## 2016-01-07 ENCOUNTER — Encounter (HOSPITAL_COMMUNITY): Payer: Medicare Other

## 2016-01-14 ENCOUNTER — Other Ambulatory Visit: Payer: Medicare Other

## 2016-01-14 DIAGNOSIS — E1121 Type 2 diabetes mellitus with diabetic nephropathy: Secondary | ICD-10-CM

## 2016-01-14 DIAGNOSIS — N183 Chronic kidney disease, stage 3 unspecified: Secondary | ICD-10-CM

## 2016-01-14 DIAGNOSIS — D649 Anemia, unspecified: Secondary | ICD-10-CM

## 2016-01-14 LAB — HEMOGLOBIN A1C
HEMOGLOBIN A1C: 7.7 % — AB (ref <5.7)
MEAN PLASMA GLUCOSE: 174 mg/dL

## 2016-01-14 LAB — COMPREHENSIVE METABOLIC PANEL
ALBUMIN: 3.7 g/dL (ref 3.6–5.1)
ALT: 9 U/L (ref 6–29)
AST: 15 U/L (ref 10–35)
Alkaline Phosphatase: 62 U/L (ref 33–130)
BUN: 43 mg/dL — ABNORMAL HIGH (ref 7–25)
CALCIUM: 8.6 mg/dL (ref 8.6–10.4)
CHLORIDE: 113 mmol/L — AB (ref 98–110)
CO2: 20 mmol/L (ref 20–31)
Creat: 1.54 mg/dL — ABNORMAL HIGH (ref 0.60–0.88)
Glucose, Bld: 127 mg/dL — ABNORMAL HIGH (ref 65–99)
Potassium: 4.9 mmol/L (ref 3.5–5.3)
Sodium: 142 mmol/L (ref 135–146)
TOTAL PROTEIN: 6 g/dL — AB (ref 6.1–8.1)
Total Bilirubin: 0.4 mg/dL (ref 0.2–1.2)

## 2016-01-15 LAB — MICROALBUMIN, URINE: MICROALB UR: 45 mg/dL

## 2016-01-20 ENCOUNTER — Ambulatory Visit (INDEPENDENT_AMBULATORY_CARE_PROVIDER_SITE_OTHER): Payer: Medicare Other | Admitting: Internal Medicine

## 2016-01-20 ENCOUNTER — Encounter: Payer: Self-pay | Admitting: Internal Medicine

## 2016-01-20 VITALS — BP 126/76 | HR 62 | Temp 97.7°F | Ht 60.0 in | Wt 147.0 lb

## 2016-01-20 DIAGNOSIS — I10 Essential (primary) hypertension: Secondary | ICD-10-CM

## 2016-01-20 DIAGNOSIS — K5900 Constipation, unspecified: Secondary | ICD-10-CM | POA: Diagnosis not present

## 2016-01-20 DIAGNOSIS — G309 Alzheimer's disease, unspecified: Secondary | ICD-10-CM | POA: Diagnosis not present

## 2016-01-20 DIAGNOSIS — E1121 Type 2 diabetes mellitus with diabetic nephropathy: Secondary | ICD-10-CM | POA: Diagnosis not present

## 2016-01-20 DIAGNOSIS — E785 Hyperlipidemia, unspecified: Secondary | ICD-10-CM

## 2016-01-20 DIAGNOSIS — F0280 Dementia in other diseases classified elsewhere without behavioral disturbance: Secondary | ICD-10-CM | POA: Diagnosis not present

## 2016-01-20 DIAGNOSIS — N183 Chronic kidney disease, stage 3 unspecified: Secondary | ICD-10-CM

## 2016-01-20 DIAGNOSIS — D649 Anemia, unspecified: Secondary | ICD-10-CM | POA: Diagnosis not present

## 2016-01-20 DIAGNOSIS — M25569 Pain in unspecified knee: Secondary | ICD-10-CM

## 2016-01-20 NOTE — Progress Notes (Signed)
Facility  [place]    Place of Service:   OFFICE    Allergies  Allergen Reactions  . Namzaric [Memantine Hcl-Donepezil Hcl] Nausea Only    confusion  . Percocet [Oxycodone-Acetaminophen] Other (See Comments)    loosy goosy  . Sulfa Antibiotics Other (See Comments)    Tongue swells  . Adhesive [Tape] Other (See Comments)    Tears skin.  Please use "paper" tape  . Metformin And Related Other (See Comments)    Abnormal Kidney Functions   . Tradjenta [Linagliptin]     Low back ache, resolved once medication stopped   . Penicillins Hives    Tolerates Ceftriaxone, Has patient had a PCN reaction causing immediate rash, facial/tongue/throat swelling, SOB or lightheadedness with hypotension: Unknown Has patient had a PCN reaction causing severe rash involving mucus membranes or skin necrosis: No Has patient had a PCN reaction that required hospitalization No Has patient had a PCN reaction occurring within the last 10 years: No If all of the above answers are "NO", then may proceed with Cephalosporin use.     Chief Complaint  Patient presents with  . Medical Management of Chronic Issues    4 month medication management blood pressure, blood sugar, cholesterol, dementia, CKD, anemia, review labs.   . legs    pain in lower legs, started yesterday.   Marland Kitchen MMSE    25/30 failed clock drawing    HPI:  Last seen by me 11/04/15. Seen by cardiology on 11/06/15. No changes recommended. Went to ER 12/14/15 for rectal pain likely related to constipation. Hemorrhoid noted on exam. Then came here on 12/21/15 for appt with J. Eubanks. Noted to have anal prolapse, but it was resolved on that visit. Constipation was noted as improved on Colace and Miralax.  Anemia has been noted. See results below. Etiology undetermined, but it seems to be improving.  MMSE shows a rise to 25/30 from 20/30. Failed clock drawing on both of the last checks.  Very unhappy with the daughter that lives with her,  Vaughan Basta.  Medications: Patient's Medications  New Prescriptions   No medications on file  Previous Medications   CLONIDINE (CATAPRES) 0.1 MG TABLET    Take 1 tablet (0.1 mg total) by mouth daily. For high blood pressure   EZETIMIBE-SIMVASTATIN (VYTORIN) 10-20 MG TABLET    1/2 tablet nightly to control cholesterol   LOSARTAN (COZAAR) 50 MG TABLET    Take 50 mg by mouth daily.    LOTEMAX 0.5 % GEL    One drop in the left eye twice daly to control glaucome   METFORMIN (GLUCOPHAGE) 500 MG TABLET    Take 500 mg by mouth daily.   NITROGLYCERIN (NITROSTAT) 0.4 MG SL TABLET    Place one under tongue for chest pain, up to 3 tablets   POLYETHYLENE GLYCOL POWDER (GLYCOLAX/MIRALAX) POWDER    DISSOLVE 17 GRAMS (1 CAPFUL) INTO 8 OUNCES OF LIQUID AND DRINK EVERY DAY AS NEEDED FOR BOWELS   TIMOLOL (BETIMOL) 0.5 % OPHTHALMIC SOLUTION    One drop in the left eye daily to help control glaucoma   VYTORIN 10-20 MG TABLET    TAKE 1/2 TABLET BY MOUTH EVERY NIGHT AT BEDTIME.  Modified Medications   No medications on file  Discontinued Medications   No medications on file    Review of Systems  Constitutional: Negative for activity change, appetite change, chills, diaphoresis, fatigue, fever and unexpected weight change.  HENT: Positive for sore throat (Acute issue). Negative for  congestion, ear discharge, ear pain, hearing loss, postnasal drip, rhinorrhea, tinnitus, trouble swallowing and voice change.   Eyes: Positive for visual disturbance (corrective lenses). Negative for pain, redness and itching.       History of glaucoma and ptosis of the left eye.a  Respiratory: Negative for cough, choking, shortness of breath and wheezing.   Cardiovascular: Positive for leg swelling. Negative for chest pain and palpitations.       History of chronic diastolic heart failure  Gastrointestinal: Positive for abdominal pain and constipation. Negative for abdominal distention, anal bleeding, diarrhea, nausea and rectal pain.   Endocrine: Negative for cold intolerance, heat intolerance, polydipsia, polyphagia and polyuria.  Genitourinary: Negative for difficulty urinating, dysuria, flank pain, frequency, hematuria, pelvic pain, urgency and vaginal discharge.       Indwelling Foley catheter. Mass at rectum following straining CKD 3  Musculoskeletal: Positive for gait problem. Negative for arthralgias, back pain, myalgias, neck pain and neck stiffness.  Skin: Negative for color change, pallor, rash and wound.  Allergic/Immunologic: Negative.   Neurological: Negative for dizziness, tremors, seizures, syncope, weakness, light-headedness, numbness and headaches.       Mild-moderate dementia  Hematological: Negative for adenopathy. Does not bruise/bleed easily.       Chronic Anemia  Psychiatric/Behavioral: Positive for confusion. Negative for agitation, behavioral problems, dysphoric mood, hallucinations, sleep disturbance and suicidal ideas. The patient is not nervous/anxious and is not hyperactive.     Vitals:   01/20/16 1527  BP: 126/76  Pulse: 62  Temp: 97.7 F (36.5 C)  TempSrc: Oral  SpO2: 94%  Weight: 147 lb (66.7 kg)  Height: 5' (1.524 m)   Body mass index is 28.71 kg/m. Wt Readings from Last 3 Encounters:  01/20/16 147 lb (66.7 kg)  12/21/15 145 lb 6.4 oz (66 kg)  11/06/15 146 lb (66.2 kg)      Physical Exam  Constitutional: She is oriented to person, place, and time. She appears well-developed and well-nourished. No distress.  Frail elderly female, NAD  HENT:  Head: Normocephalic and atraumatic.  Right Ear: External ear normal.  Left Ear: External ear normal.  Nose: Nose normal.  Mouth/Throat: Oropharynx is clear and moist. No oropharyngeal exudate.  Eyes: Conjunctivae and EOM are normal. Pupils are equal, round, and reactive to light. No scleral icterus.  Ptosis of the left lid.  Neck: Normal range of motion. Neck supple. No JVD present. No tracheal deviation present. No thyromegaly  present.  Cardiovascular: Normal rate, regular rhythm, normal heart sounds and intact distal pulses.  Exam reveals no gallop and no friction rub.   No murmur heard. Pulmonary/Chest: Effort normal and breath sounds normal. No respiratory distress. She has no wheezes. She has no rales. She exhibits no tenderness.  Abdominal: Soft. Bowel sounds are normal. She exhibits no distension and no mass. There is no tenderness.  Genitourinary:  Genitourinary Comments: Loose anal sphincter. No palpable rectal mass. With straining, there is some mucosal eversion. Removed Foley catheter during office visit 06/24/15.  Musculoskeletal: Normal range of motion. She exhibits edema. She exhibits no tenderness.  Lymphadenopathy:    She has no cervical adenopathy.  Neurological: She is alert and oriented to person, place, and time. No cranial nerve deficit. Coordination normal.  Slow unstable gait 06/10/15 MMSE 20/30. Failed clock drawing. 01/20/16 MMSE 25/30. Failed clock drawing.  Skin: Skin is warm and dry. No rash noted. She is not diaphoretic. No erythema. No pallor.  Ingrown nail of the left great toe.  Psychiatric: She has  a normal mood and affect. Her behavior is normal. Judgment and thought content normal.    Labs reviewed: Lab Summary Latest Ref Rng & Units 01/14/2016 12/21/2015 12/14/2015 11/04/2015  Hemoglobin 11.7 - 15.5 g/dL (None) 11.3(L) 11.8(L) 10.7(L)  Hematocrit 35.0 - 45.0 % (None) 34.3(L) 35.8(L) 33.5(L)  White count 3.8 - 10.8 K/uL (None) 4.5 5.1 4.1  Platelet count 140 - 400 K/uL (None) 183 PLATELET CLUMPS NOTED ON SMEAR, UNABLE TO ESTIMATE 200  Sodium 135 - 146 mmol/L 142 (None) 145 144  Potassium 3.5 - 5.3 mmol/L 4.9 (None) 4.9 4.9  Calcium 8.6 - 10.4 mg/dL 8.6 (None) 8.8(L) 8.6  Phosphorus - (None) (None) (None) (None)  Creatinine 0.60 - 0.88 mg/dL 1.54(H) (None) 1.65(H) 1.42(H)  AST 10 - 35 U/L 15 (None) (None) 14  Alk Phos 33 - 130 U/L 62 (None) (None) 56  Bilirubin 0.2 - 1.2 mg/dL 0.4  (None) (None) 0.4  Glucose 65 - 99 mg/dL 127(H) (None) 123(H) 134(H)  Cholesterol - (None) (None) (None) (None)  HDL cholesterol - (None) (None) (None) (None)  Triglycerides - (None) (None) (None) (None)  LDL Direct - (None) (None) (None) (None)  LDL Calc - (None) (None) (None) (None)  Total protein 6.1 - 8.1 g/dL 6.0(L) (None) (None) 5.5(L)  Albumin 3.6 - 5.1 g/dL 3.7 (None) (None) 3.6  Some recent data might be hidden   Lab Results  Component Value Date   TSH 2.149 06/12/2015   TSH 4.25 05/01/2014   TSH 5.43 (H) 04/02/2014   Lab Results  Component Value Date   BUN 43 (H) 01/14/2016   BUN 46 (H) 12/14/2015   BUN 31 (H) 11/04/2015   Lab Results  Component Value Date   HGBA1C 7.7 (H) 01/14/2016   HGBA1C 7.7 (H) 11/04/2015   HGBA1C 7.9 (H) 05/22/2015    Assessment/Plan  1. Essential hypertension controlled - Comprehensive metabolic panel; Future  2. Hyperlipidemia, unspecified hyperlipidemia type - Lipid panel; Future  3. Diabetes mellitus with nephropathy (HCC) Satisfactory control for frail 82 year old. - Comprehensive metabolic panel; Future - Hemoglobin A1c; Future  4. Alzheimer's dementia without behavioral disturbance, unspecified timing of dementia onset Likely cause of memory loss  5. Chronic kidney disease, stage III (moderate) - Comprehensive metabolic panel; Future  6. Anemia, unspecified type Take iron daily - CBC with Differential/Platelet; Future  7. Arthralgia of lower leg, unspecified laterality stable  8. Constipation, unspecified constipation type improved

## 2016-01-26 ENCOUNTER — Other Ambulatory Visit: Payer: Self-pay | Admitting: Internal Medicine

## 2016-01-29 ENCOUNTER — Encounter: Payer: Self-pay | Admitting: Vascular Surgery

## 2016-02-04 ENCOUNTER — Ambulatory Visit (INDEPENDENT_AMBULATORY_CARE_PROVIDER_SITE_OTHER): Payer: Medicare Other | Admitting: Vascular Surgery

## 2016-02-04 ENCOUNTER — Encounter: Payer: Self-pay | Admitting: Vascular Surgery

## 2016-02-04 ENCOUNTER — Ambulatory Visit (HOSPITAL_COMMUNITY)
Admission: RE | Admit: 2016-02-04 | Discharge: 2016-02-04 | Disposition: A | Discharge status: Home / Self Care | Payer: Medicare Other | Source: Ambulatory Visit | Attending: Vascular Surgery | Admitting: Vascular Surgery

## 2016-02-04 VITALS — BP 203/75 | HR 63 | Temp 98.4°F | Resp 16 | Ht 60.0 in | Wt 149.0 lb

## 2016-02-04 DIAGNOSIS — I739 Peripheral vascular disease, unspecified: Secondary | ICD-10-CM | POA: Diagnosis not present

## 2016-02-04 DIAGNOSIS — I83013 Varicose veins of right lower extremity with ulcer of ankle: Secondary | ICD-10-CM | POA: Diagnosis not present

## 2016-02-04 DIAGNOSIS — R9439 Abnormal result of other cardiovascular function study: Secondary | ICD-10-CM | POA: Diagnosis not present

## 2016-02-04 DIAGNOSIS — L97319 Non-pressure chronic ulcer of right ankle with unspecified severity: Secondary | ICD-10-CM

## 2016-02-04 NOTE — Progress Notes (Signed)
Patient is an 82 year old female who has been undergoing Unna boot therapy for a stasis ulcer on her right medial malleolus. She presents today for follow up. She states she has no new ulcers. She has previously undergone bilateral common iliac stenting in October 2015. At the time of her arteriogram she had bilateral moderate superficial femoral artery stenoses. 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 She has had multiple chronic exacerbations and remissions of this ulcer over the years.  Does complain of some intermittent pain in the right foot on the dorsal aspect around the level of the tarsal bones. He states that this is relieved by rubbing cream on it.     Review of systems: She denies any significant drainage from the wounds or fever. She denies chest pain or shortness of breath. She walks daily but states she is a little unstable     Physical exam:    Vitals:   02/04/16 1321 02/04/16 1323  BP: (!) 189/82 (!) 203/75  Pulse: 63   Resp: 16   Temp: 98.4 F (36.9 C)   TempSrc: Oral   SpO2: 95%   Weight: 149 lb (67.6 kg)   Height: 5' (1.524 m)      Extremities :Thickened nails both feet. 2+ femoral pulses bilaterally, thickened toenails bilaterally, 2+ left her Salas pedis pulse absent pedal pulses right foot no ulcers  Data: Patient had bilateral ABIs performed on 02/04/2016. I reviewed and interpreted this study. Right side ABI was 0.6 left 0.7    Assessment: Stable peripheral arterial disease currently no wounds.   I do not believe her foot pain is related to arterial or venous disease.   Plan:    patient will follow-up in one year with repeat ABIs with our nurse practitioner.       Fabienne Brunsharles Jewell Ryans, MD [facility] Office: [phone] Pager: [phone]

## 2016-02-08 NOTE — Addendum Note (Signed)
Addended by: Burton ApleyPETTY, Augusten Lipkin A on: 02/08/2016 08:56 AM   Modules accepted: Orders

## 2016-02-16 ENCOUNTER — Telehealth: Payer: Self-pay | Admitting: *Deleted

## 2016-02-16 ENCOUNTER — Other Ambulatory Visit: Payer: Self-pay | Admitting: *Deleted

## 2016-02-16 NOTE — Telephone Encounter (Signed)
Due to her medical hx of heart failure, she needs to be seen in the office to determine if something else is causing her swelling.

## 2016-02-16 NOTE — Telephone Encounter (Signed)
Patient daughter, Arna Mediciora is calling requesting a refill on a previous medication that the patient took that is not in current medication list. She would like Furosemide called to pharmacy. Patient has some swelling and this usually takes care of it per daughter. (Dr. Chilton SiGreen Patient) Please Advise.

## 2016-02-16 NOTE — Telephone Encounter (Signed)
Patient daughter [name] notified and agreed. No available appointment for tomorrow, her car is going to be in the shop Thursday, appointment scheduled for Friday.

## 2016-02-19 ENCOUNTER — Ambulatory Visit (INDEPENDENT_AMBULATORY_CARE_PROVIDER_SITE_OTHER): Payer: Medicare Other | Admitting: Internal Medicine

## 2016-02-19 ENCOUNTER — Encounter: Payer: Self-pay | Admitting: Internal Medicine

## 2016-02-19 VITALS — BP 120/70 | HR 66 | Temp 98.3°F | Wt 147.0 lb

## 2016-02-19 DIAGNOSIS — I5032 Chronic diastolic (congestive) heart failure: Secondary | ICD-10-CM | POA: Diagnosis not present

## 2016-02-19 DIAGNOSIS — E1121 Type 2 diabetes mellitus with diabetic nephropathy: Secondary | ICD-10-CM

## 2016-02-19 DIAGNOSIS — Z79899 Other long term (current) drug therapy: Secondary | ICD-10-CM | POA: Diagnosis not present

## 2016-02-19 DIAGNOSIS — I872 Venous insufficiency (chronic) (peripheral): Secondary | ICD-10-CM | POA: Diagnosis not present

## 2016-02-19 MED ORDER — FUROSEMIDE 20 MG PO TABS: 20.0000 mg | ORAL_TABLET | Freq: Every day | ORAL | 1 refills | Status: DC

## 2016-02-19 NOTE — Progress Notes (Signed)
Location:  [facility] clinic Provider: Kavitha Lansdale L. Renato Gails, D.O., C.M.D.  Code Status: DNR Goals of Care:  Advanced Directives 02/04/2016  Does Patient Have a Medical Advance Directive? No  Type of Advance Directive -  Does patient want to make changes to medical advance directive? -  Copy of Healthcare Power of Attorney in Chart? No - copy requested  Would patient like information on creating a medical advance directive? -     Chief Complaint  Patient presents with  . Acute Visit    discuss swelling    HPI: Patient is a 82 y.o. female seen today for an acute visit for swelling of her legs.  She took a lasix and it's better.  She is Dr. Amanda Cockayne' patient.  She does have a h/o diastolic chf NYHA class 1 compensated (per Dr. Thomasene Lot note from August) but was taken off of her lasix 20mg  several mos ago (there in June, gone in July).  The echocardiogram 06/07/14 was not done.  She reports still having a little fluid in her legs.  She only had 4 lasix to take.  She had not been taking the lasix since the summer.  She actually was using it as needed so it appears that because she did not use it consistently, it got removed from her list.  She notes she had some increased edema even before Thanksgiving.  Her old prescription for lasix had refills, but they'd expired due to nonuse.  She does think she also may have had some increased sodium at Thanksgiving b/c her daughter may have put salt in the food, but pt herself does not use salt.  She was on amlodipine but that was stopped earlier this year due to edema.  She is not taking it.    She also reports that she used to be on 5 regular pills but thinks she is missing one she should be taking that is not coming from the pharmacy.  I'm confused about her meds b/c metformin is on her med list and her allergy list (for renal failure).  She is still taking it.  Her last cr was 1.54 on 10/26.  She's been tried on tradjenta but did not tolerate it.  Her last hba1c  was ok at 7.7 also 10/26.  Looking at her meds, she also was on coreg at one point, but it's not actively on her med list.  Aspirin is no longer on her list so I'm thinking one of those is the missing pill.   Past Medical History:  Diagnosis Date  . Anemia   . Carotid artery occlusion   . Carotid bruit   . Chronic kidney disease (CKD), stage III (moderate)   . Coronary artery disease    Remote PCI. Had BMS to RCA and DES stent to Diagonal in Jan. 2012   . Diastolic heart failure    EF 55 to 60% per cath in Jan 2012  . DVT (deep venous thrombosis) (HCC)   . Gout   . Hyperlipidemia   . Hypertension   . Ischemic cardiomyopathy    with prior EF of 35%  . NSTEMI (non-ST elevated myocardial infarction) [facility]) Jan 2012   with PCI to RCA and DX per Dr. Excell Seltzer  . Obesity   . Pneumonia 2016  . Proteinuria   . Stroke [facility]) 2013   ?  mini    . TIA (transient ischemic attack)   . Type II diabetes mellitus (HCC)    long standing  Past Surgical History:  Procedure Laterality Date  . ABDOMINAL AORTAGRAM N/A 01/17/2014   Procedure: ABDOMINAL Ronny Flurry;  Surgeon: Sherren Kerns, MD;  Location: [facility] CATH LAB;  Service: Cardiovascular;  Laterality: N/A;  . ANKLE FUSION Bilateral   . APPENDECTOMY    . CATARACT EXTRACTION, BILATERAL Bilateral   . CORONARY ANGIOPLASTY WITH STENT PLACEMENT  04/05/2010   distal RCA  . FRACTURE SURGERY Right 2005   bilateral ankles   . TONSILLECTOMY      Allergies  Allergen Reactions  . Namzaric [Memantine Hcl-Donepezil Hcl] Nausea Only    confusion  . Percocet [Oxycodone-Acetaminophen] Other (See Comments)    loosy goosy  . Sulfa Antibiotics Other (See Comments)    Tongue swells  . Adhesive [Tape] Other (See Comments)    Tears skin.  Please use "paper" tape  . Metformin And Related Other (See Comments)    Abnormal Kidney Functions   . Tradjenta [Linagliptin]     Low back ache, resolved once medication stopped   . Penicillins Hives    Tolerates  Ceftriaxone, Has patient had a PCN reaction causing immediate rash, facial/tongue/throat swelling, SOB or lightheadedness with hypotension: Unknown Has patient had a PCN reaction causing severe rash involving mucus membranes or skin necrosis: No Has patient had a PCN reaction that required hospitalization No Has patient had a PCN reaction occurring within the last 10 years: No If all of the above answers are "NO", then may proceed with Cephalosporin use.       Medication List       Accurate as of 02/19/16 10:44 AM. Always use your most recent med list.          cloNIDine 0.1 MG tablet Commonly known as:  CATAPRES Take 1 tablet (0.1 mg total) by mouth daily. For high blood pressure   ezetimibe-simvastatin 10-20 MG tablet Commonly known as:  VYTORIN 1/2 tablet nightly to control cholesterol   VYTORIN 10-20 MG tablet Generic drug:  ezetimibe-simvastatin TAKE 1/2 TABLET BY MOUTH EVERY NIGHT AT BEDTIME.   losartan 50 MG tablet Commonly known as:  COZAAR Take 50 mg by mouth daily.   LOTEMAX 0.5 % Gel Generic drug:  Loteprednol Etabonate One drop in the left eye twice daly to control glaucome   metFORMIN 500 MG tablet Commonly known as:  GLUCOPHAGE Take 500 mg by mouth daily.   nitroGLYCERIN 0.4 MG SL tablet Commonly known as:  NITROSTAT Place one under tongue for chest pain, up to 3 tablets   polyethylene glycol powder powder Commonly known as:  GLYCOLAX/MIRALAX DISSOLVE 17 GRAMS (1 CAPFUL) INTO 8 OUNCES OF LIQUID AND DRINK EVERY DAY AS NEEDED FOR BOWELS   timolol 0.5 % ophthalmic solution Commonly known as:  BETIMOL One drop in the left eye daily to help control glaucoma   BETIMOL 0.5 % ophthalmic solution Generic drug:  timolol INSTILL 1 DROP INTO THE LEFT EYE DAILY       Review of Systems:  Review of Systems  Constitutional: Negative for chills and fever.  HENT: Positive for hearing loss.   Eyes: Positive for blurred vision.  Respiratory: Negative for  cough and shortness of breath.   Cardiovascular: Positive for leg swelling. Negative for chest pain, palpitations, orthopnea and PND.  Endo/Heme/Allergies: Bruises/bleeds easily.  Psychiatric/Behavioral: Positive for memory loss.    Health Maintenance  Topic Date Due  . FOOT EXAM  06/29/1937  . TETANUS/TDAP  06/28/1948  . ZOSTAVAX  06/28/1989  . PNA vac Low Risk Adult (2 of 2 -  PCV13) 10/19/2023 (Originally 06/29/1995)  . HEMOGLOBIN A1C  07/14/2016  . OPHTHALMOLOGY EXAM  09/29/2016  . INFLUENZA VACCINE  Completed  . DEXA SCAN  Completed    Physical Exam: Vitals:   02/19/16 1025  BP: 120/70  Pulse: 66  Temp: 98.3 F (36.8 C)  TempSrc: Oral  SpO2: 95%  Weight: 147 lb (66.7 kg)   Body mass index is 28.71 kg/m. Physical Exam  Constitutional: She appears well-developed and well-nourished. No distress.  Eyes:  Glasses; difficulty reading list of medications  Cardiovascular: Normal rate, regular rhythm and intact distal pulses.   Murmur heard. 2+ pitting edema present bilateral lower legs  Pulmonary/Chest: Effort normal and breath sounds normal. She has no rales.  Abdominal: Soft. Bowel sounds are normal.  Neurological: She is alert.  Skin: Skin is warm and dry. There is pallor.   Labs reviewed: Basic Metabolic Panel:  Recent Labs  40/98/1103/24/17 1617  11/04/15 1003 12/14/15 1834 01/14/16 0903  NA  --   < > 144 145 142  K  --   < > 4.9 4.9 4.9  CL  --   < > 112* 113* 113*  CO2  --   < > 23 22 20   GLUCOSE  --   < > 134* 123* 127*  BUN  --   < > 31* 46* 43*  CREATININE  --   < > 1.42* 1.65* 1.54*  CALCIUM  --   < > 8.6 8.8* 8.6  TSH 2.149  --   --   --   --   < > = values in this interval not displayed. Liver Function Tests:  Recent Labs  06/21/15 1426 11/04/15 1003 01/14/16 0903  AST 22 14 15   ALT 19 7 9   ALKPHOS 59 56 62  BILITOT 0.3 0.4 0.4  PROT 6.3* 5.5* 6.0*  ALBUMIN 3.6 3.6 3.7    Recent Labs  06/12/15 1355 06/21/15 1426  LIPASE 44 24   No  results for input(s): AMMONIA in the last 8760 hours. CBC:  Recent Labs  11/04/15 1003 12/14/15 1834 12/21/15 1105  WBC 4.1 5.1 4.5  NEUTROABS 2,337 3.1 1,980  HGB 10.7* 11.8* 11.3*  HCT 33.5* 35.8* 34.3*  MCV 91.8 90.6 91.5  PLT 200 PLATELET CLUMPS NOTED ON SMEAR, UNABLE TO ESTIMATE 183   Lipid Panel:  Recent Labs  03/09/15 1129  CHOL 143  HDL 35*  LDLCALC 74  TRIG [id number]*  CHOLHDL 4.1   Lab Results  Component Value Date   HGBA1C 7.7 (H) 01/14/2016    Assessment/Plan 1. Chronic diastolic heart failure, NYHA class 1 (HCC) - recent weight gain over the past several months (she reports since just before thanksgiving that her edema is worse, but weights up since summer) - furosemide (LASIX) 20 MG tablet; Take 1 tablet (20 mg total) by mouth daily. For 3 days as needed for weight gain due to heart failure  Dispense: 9 tablet; Refill: 1 - counseled on obtaining daily weights to help with determining when to take her lasix; reports her baseline weight is 140 (low here has been 141) -to take lasix for 3 more days this time -also to eat 1/2 banana when taking lasix to help keep potassium normal -if lasix is ineffective, she should call us promptly for evaluation; also if she develops chest pain, sob  2. Chronic venous insufficiency -appears she has this, as well -elevate feet at rest -if she does not have arterial disease, may benefit from compression hose  3. Medication management -pt does not think her med list is complete and thinks pharmacy is not sending something she should be taking--?coreg or asa -I don't know why aspirin would be stopped for uremia  4.  DMII with nephropathy - not sure why she is back on metformin with her CKD, did not tolerate tradjenta -probably technically needs insulin to manage with her degree of CKD  Next appt:  04/27/2016  Calaya Gildner L. Dylyn Mclaren, D.O. Geriatrics [facility] [address] [place], [place]  [id number] Cell Phone (Mon-Fri 8am-5pm):  [phone] On Call:  [phone] & follow prompts after 5pm & weekends Office Phone:  [phone] Office Fax:  [phone]

## 2016-02-19 NOTE — Patient Instructions (Signed)
Heart Failure  Heart failure means your heart has trouble pumping blood. This makes it hard for your body to work well. Heart failure is usually a long-term (chronic) condition. You must take good care of yourself and follow your doctor's treatment plan.  HOME CARE   Take your heart medicine as told by your doctor.    Do not stop taking medicine unless your doctor tells you to.    Do not skip any dose of medicine.    Refill your medicines before they run out.    Take other medicines only as told by your doctor or pharmacist.   Stay active if told by your doctor. The elderly and people with severe heart failure should talk with a doctor about physical activity.   Eat heart-healthy foods. Choose foods that are without trans fat and are low in saturated fat, cholesterol, and salt (sodium). This includes fresh or frozen fruits and vegetables, fish, lean meats, fat-free or low-fat dairy foods, whole grains, and high-fiber foods. Lentils and dried peas and beans (legumes) are also good choices.   Limit salt if told by your doctor.   Cook in a healthy way. Roast, grill, broil, bake, poach, steam, or stir-fry foods.   Limit fluids as told by your doctor.   Weigh yourself every morning. Do this after you pee (urinate) and before you eat breakfast. Write down your weight to give to your doctor.   Take your blood pressure and write it down if your doctor tells you to.   Ask your doctor how to check your pulse. Check your pulse as told.   Lose weight if told by your doctor.   Stop smoking or chewing tobacco. Do not use gum or patches that help you quit without your doctor's approval.   Schedule and go to doctor visits as told.   Nonpregnant women should have no more than 1 drink a day. Men should have no more than 2 drinks a day. Talk to your doctor about drinking alcohol.   Stop illegal drug use.   Stay current with shots (immunizations).   Manage your health conditions as told by your doctor.   Learn to  manage your stress.   Rest when you are tired.   If it is really hot outside:    Avoid intense activities.    Use air conditioning or fans, or get in a cooler place.    Avoid caffeine and alcohol.    Wear loose-fitting, lightweight, and light-colored clothing.   If it is really cold outside:    Avoid intense activities.    Layer your clothing.    Wear mittens or gloves, a hat, and a scarf when going outside.    Avoid alcohol.   Learn about heart failure and get support as needed.   Get help to maintain or improve your quality of life and your ability to care for yourself as needed.  GET HELP IF:    You gain weight quickly.   You are more short of breath than usual.   You cannot do your normal activities.   You tire easily.   You cough more than normal, especially with activity.   You have any or more puffiness (swelling) in areas such as your hands, feet, ankles, or belly (abdomen).   You cannot sleep because it is hard to breathe.   You feel like your heart is beating fast (palpitations).   You get dizzy or light-headed when you stand up.  GET HELP   RIGHT AWAY IF:    You have trouble breathing.   There is a change in mental status, such as becoming less alert or not being able to focus.   You have chest pain or discomfort.   You faint.  MAKE SURE YOU:    Understand these instructions.   Will watch your condition.   Will get help right away if you are not doing well or get worse.     This information is not intended to replace advice given to you by your health care provider. Make sure you discuss any questions you have with your health care provider.     Document Released: 12/15/2007 Document Revised: 03/28/2014 Document Reviewed: 04/23/2012  Elsevier Interactive Patient Education 2017 [employer]vier Inc[employer]

## 2016-02-23 ENCOUNTER — Other Ambulatory Visit: Payer: Self-pay | Admitting: Internal Medicine

## 2016-02-23 DIAGNOSIS — H409 Unspecified glaucoma: Secondary | ICD-10-CM

## 2016-02-26 ENCOUNTER — Encounter: Payer: Self-pay | Admitting: Podiatry

## 2016-02-26 ENCOUNTER — Ambulatory Visit (INDEPENDENT_AMBULATORY_CARE_PROVIDER_SITE_OTHER): Payer: Medicare Other | Admitting: Podiatry

## 2016-02-26 VITALS — Wt 147.0 lb

## 2016-02-26 DIAGNOSIS — E119 Type 2 diabetes mellitus without complications: Secondary | ICD-10-CM

## 2016-02-26 DIAGNOSIS — B351 Tinea unguium: Secondary | ICD-10-CM | POA: Diagnosis not present

## 2016-02-26 DIAGNOSIS — M79676 Pain in unspecified toe(s): Secondary | ICD-10-CM

## 2016-02-26 NOTE — Progress Notes (Signed)
Complaint:  Visit Type: Patient returns to my office for continued preventative foot care services. Complaint: Patient states" my nails have grown long and thick and become painful to walk and wear shoes" Patient has been diagnosed with DM with no foot complications. The patient presents for preventative foot care services. No changes to ROS  Podiatric Exam: Vascular: dorsalis pedis and posterior tibial pulses are palpable bilateral. Capillary return is immediate. Temperature gradient is WNL. Skin turgor WNL  Sensorium: Normal Semmes Weinstein monofilament test. Normal tactile sensation bilaterally. Nail Exam: Pt has thick disfigured discolored nails with subungual debris noted bilateral entire nail hallux through fifth toenails Ulcer Exam: There is no evidence of ulcer or pre-ulcerative changes or infection. Orthopedic Exam: Muscle tone and strength are WNL. No limitations in general ROM. No crepitus or effusions noted. Foot type and digits show no abnormalities. Bony prominences are unremarkable. Skin: No Porokeratosis. No infection or ulcers  Diagnosis:  Onychomycosis, , Pain in right toe, pain in left toes  Treatment & Plan Procedures and Treatment: Consent by patient was obtained for treatment procedures. The patient understood the discussion of treatment and procedures well. All questions were answered thoroughly reviewed. Debridement of mycotic and hypertrophic toenails, 1 through 5 bilateral and clearing of subungual debris. No ulceration, no infection noted.  Return Visit-Office Procedure: Patient instructed to return to the office for a follow up visit 3 months for continued evaluation and treatment.    Annaelle Kasel DPM 

## 2016-03-02 ENCOUNTER — Ambulatory Visit: Payer: Medicare Other | Admitting: Internal Medicine

## 2016-03-04 LAB — CBC AND DIFFERENTIAL
HEMATOCRIT: 36 % (ref 36–46)
Hemoglobin: 11.6 g/dL — AB (ref 12.0–16.0)
PLATELETS: 189 10*3/uL (ref 150–399)
WBC: 5.5 10^3/mL

## 2016-03-04 LAB — HEPATIC FUNCTION PANEL
ALK PHOS: 71 U/L (ref 25–125)
ALT: 7 U/L (ref 7–35)
AST: 16 U/L (ref 13–35)

## 2016-03-04 LAB — BASIC METABOLIC PANEL
BUN: 31 mg/dL — AB (ref 4–21)
Creatinine: 1.9 mg/dL — AB (ref 0.5–1.1)
Glucose: 188 mg/dL
Potassium: 5.7 mmol/L — AB (ref 3.4–5.3)
SODIUM: 144 mmol/L (ref 137–147)

## 2016-03-07 ENCOUNTER — Encounter: Payer: Self-pay | Admitting: *Deleted

## 2016-03-07 NOTE — Progress Notes (Signed)
Results received from Dr. Celso Amyina Garrett. Abstracted. Placed for Dr. Chilton SiGreen to review.

## 2016-03-08 ENCOUNTER — Ambulatory Visit (INDEPENDENT_AMBULATORY_CARE_PROVIDER_SITE_OTHER): Payer: Medicare Other | Admitting: Internal Medicine

## 2016-03-08 ENCOUNTER — Encounter: Payer: Self-pay | Admitting: Internal Medicine

## 2016-03-08 VITALS — BP 140/76 | HR 59 | Temp 97.6°F | Ht 60.0 in | Wt 149.0 lb

## 2016-03-08 DIAGNOSIS — N183 Chronic kidney disease, stage 3 unspecified: Secondary | ICD-10-CM

## 2016-03-08 DIAGNOSIS — E1121 Type 2 diabetes mellitus with diabetic nephropathy: Secondary | ICD-10-CM

## 2016-03-08 DIAGNOSIS — K622 Anal prolapse: Secondary | ICD-10-CM

## 2016-03-08 DIAGNOSIS — D649 Anemia, unspecified: Secondary | ICD-10-CM

## 2016-03-08 DIAGNOSIS — I5032 Chronic diastolic (congestive) heart failure: Secondary | ICD-10-CM

## 2016-03-08 DIAGNOSIS — I10 Essential (primary) hypertension: Secondary | ICD-10-CM

## 2016-03-08 MED ORDER — FUROSEMIDE 20 MG PO TABS: ORAL_TABLET | ORAL | 3 refills | Status: DC

## 2016-03-08 MED ORDER — PROCTO-MED HC 2.5 % RE CREA: TOPICAL_CREAM | RECTAL | 2 refills | Status: DC

## 2016-03-08 MED ORDER — METFORMIN HCL 500 MG PO TABS: ORAL_TABLET | ORAL | 4 refills | Status: DC

## 2016-03-08 MED ORDER — PROCTO-MED HC 2.5 % RE CREA: TOPICAL_CREAM | RECTAL | 6 refills | Status: DC

## 2016-03-08 NOTE — Progress Notes (Signed)
Facility  [facility]    Place of Service:   OFFICE    Allergies  Allergen Reactions  . Namzaric [Memantine Hcl-Donepezil Hcl] Nausea Only    confusion  . Percocet [Oxycodone-Acetaminophen] Other (See Comments)    loosy goosy  . Sulfa Antibiotics Other (See Comments)    Tongue swells  . Adhesive [Tape] Other (See Comments)    Tears skin.  Please use "paper" tape  . Metformin And Related Other (See Comments)    Abnormal Kidney Functions   . Tradjenta [Linagliptin]     Low back ache, resolved once medication stopped   . Penicillins Hives    Tolerates Ceftriaxone, Has patient had a PCN reaction causing immediate rash, facial/tongue/throat swelling, SOB or lightheadedness with hypotension: Unknown Has patient had a PCN reaction causing severe rash involving mucus membranes or skin necrosis: No Has patient had a PCN reaction that required hospitalization No Has patient had a PCN reaction occurring within the last 10 years: No If all of the above answers are "NO", then may proceed with Cephalosporin use.     Chief Complaint  Patient presents with  . Medical Management of Chronic Issues    potassium up, kidney function down, labs from Dr. Otila Kluver Garrett's office 03/04/16    HPI:  Seen 02/19/16  By Dr. Mariea Clonts CHF for and edema. Furosemide was increased. Weight has gone up 2# since then.   Glucose was high at 188 mg% on 03/04/16. She says she may have run out of metformin.  Continues to have rectal pains and rectal prolapse. Has some fecal incontinence. Saw Deliah Goody at [facility] last week and has another appt next week.   Edema has improved and she stopped the furosemide.  Medications: Patient's Medications  New Prescriptions   No medications on file  Previous Medications   BETIMOL 0.5 % OPHTHALMIC SOLUTION    INSTILL 1 DROP INTO THE LEFT EYE DAILY   CLONIDINE (CATAPRES) 0.1 MG TABLET    Take 1 tablet (0.1 mg total) by mouth daily. For high blood pressure   CLONIDINE (CATAPRES)  0.1 MG TABLET    TAKE 1 TABLET BY MOUTH EVERY DAY   EZETIMIBE-SIMVASTATIN (VYTORIN) 10-20 MG TABLET    1/2 tablet nightly to control cholesterol   FUROSEMIDE (LASIX) 20 MG TABLET    Take 1 tablet (20 mg total) by mouth daily. For 3 days as needed for weight gain due to heart failure   LOSARTAN (COZAAR) 50 MG TABLET    Take 50 mg by mouth daily.    LOSARTAN (COZAAR) 50 MG TABLET    TAKE 1 TABLET BY MOUTH EVERY DAY FOR HIGH BLOOD PRESSURE   LOTEMAX 0.5 % GEL    PLACE 1 DROP IN THE LEFT EYE TWICE DAILY.   METFORMIN (GLUCOPHAGE) 500 MG TABLET    Take 500 mg by mouth daily.   NITROGLYCERIN (NITROSTAT) 0.4 MG SL TABLET    Place one under tongue for chest pain, up to 3 tablets   POLYETHYLENE GLYCOL POWDER (GLYCOLAX/MIRALAX) POWDER    DISSOLVE 17 GRAMS (1 CAPFUL) INTO 8 OUNCES OF LIQUID AND DRINK EVERY DAY AS NEEDED FOR BOWELS  Modified Medications   No medications on file  Discontinued Medications   No medications on file    Review of Systems  Constitutional: Negative for activity change, appetite change, chills, diaphoresis, fatigue, fever and unexpected weight change.  HENT: Positive for sore throat (Acute issue). Negative for congestion, ear discharge, ear pain, hearing loss, postnasal drip,  rhinorrhea, tinnitus, trouble swallowing and voice change.   Eyes: Positive for visual disturbance (corrective lenses). Negative for pain, redness and itching.       History of glaucoma and ptosis of the left eye.a  Respiratory: Negative for cough, choking, shortness of breath and wheezing.   Cardiovascular: Positive for leg swelling. Negative for chest pain and palpitations.       History of chronic diastolic heart failure  Gastrointestinal: Positive for abdominal pain and constipation. Negative for abdominal distention, anal bleeding, diarrhea, nausea and rectal pain.  Endocrine: Negative for cold intolerance, heat intolerance, polydipsia, polyphagia and polyuria.  Genitourinary: Negative for difficulty  urinating, dysuria, flank pain, frequency, hematuria, pelvic pain, urgency and vaginal discharge.       Indwelling Foley catheter. Mass at rectum following straining CKD 3  Musculoskeletal: Positive for gait problem. Negative for arthralgias, back pain, myalgias, neck pain and neck stiffness.  Skin: Negative for color change, pallor, rash and wound.  Allergic/Immunologic: Negative.   Neurological: Negative for dizziness, tremors, seizures, syncope, weakness, light-headedness, numbness and headaches.       Mild-moderate dementia  Hematological: Negative for adenopathy. Does not bruise/bleed easily.       Chronic Anemia  Psychiatric/Behavioral: Positive for confusion. Negative for agitation, behavioral problems, dysphoric mood, hallucinations, sleep disturbance and suicidal ideas. The patient is not nervous/anxious and is not hyperactive.     Vitals:   03/08/16 1550  BP: 140/76  Pulse: (!) 59  Temp: 97.6 F (36.4 C)  TempSrc: Oral  SpO2: 96%  Weight: 149 lb (67.6 kg)  Height: 5' (1.524 m)   Body mass index is 29.1 kg/m. Wt Readings from Last 3 Encounters:  03/08/16 149 lb (67.6 kg)  02/26/16 147 lb (66.7 kg)  02/19/16 147 lb (66.7 kg)      Physical Exam  Constitutional: She is oriented to person, place, and time. She appears well-developed and well-nourished. No distress.  Frail elderly female, NAD  HENT:  Head: Normocephalic and atraumatic.  Right Ear: External ear normal.  Left Ear: External ear normal.  Nose: Nose normal.  Mouth/Throat: Oropharynx is clear and moist. No oropharyngeal exudate.  Eyes: Conjunctivae and EOM are normal. Pupils are equal, round, and reactive to light. No scleral icterus.  Glasses; difficulty reading list of medications  Neck: Normal range of motion. Neck supple. No JVD present. No tracheal deviation present. No thyromegaly present.  Cardiovascular: Normal rate, regular rhythm and intact distal pulses.  Exam reveals no gallop and no friction  rub.   Murmur heard. 2+ pitting edema present bilateral lower legs  Pulmonary/Chest: Effort normal and breath sounds normal. No respiratory distress. She has no wheezes. She has no rales. She exhibits no tenderness.  Abdominal: Soft. Bowel sounds are normal. She exhibits no distension and no mass. There is no tenderness.  Genitourinary:  Genitourinary Comments: Loose anal sphincter. No palpable rectal mass. With straining, there is some mucosal eversion. Removed Foley catheter during office visit 06/24/15.  Musculoskeletal: Normal range of motion. She exhibits no tenderness. Edema: 2+ bipedal.  Lymphadenopathy:    She has no cervical adenopathy.  Neurological: She is alert and oriented to person, place, and time. No cranial nerve deficit. Coordination normal.  Slow unstable gait 06/10/15 MMSE 20/30. Failed clock drawing. 01/20/16 MMSE 25/30. Failed clock drawing.  Skin: Skin is warm and dry. No rash noted. She is not diaphoretic. No erythema. There is pallor.  Ingrown nail of the left great toe.  Psychiatric: She has a normal mood and  affect. Her behavior is normal. Judgment and thought content normal.    Labs reviewed: Lab Summary Latest Ref Rng & Units 03/04/2016 01/14/2016 12/21/2015 12/14/2015  Hemoglobin 12.0 - 16.0 g/dL 11.6(A) (None) 11.3(L) 11.8(L)  Hematocrit 36 - 46 % 36 (None) 34.3(L) 35.8(L)  White count 10:3/mL 5.5 (None) 4.5 5.1  Platelet count 150 - 399 K/L 189 (None) 183 PLATELET CLUMPS NOTED ON SMEAR, UNABLE TO ESTIMATE  Sodium 137 - 147 mmol/L 144 142 (None) 145  Potassium 3.4 - 5.3 mmol/L 5.7(A) 4.9 (None) 4.9  Calcium 8.6 - 10.4 mg/dL (None) 8.6 (None) 8.8(L)  Phosphorus - (None) (None) (None) (None)  Creatinine 0.5 - 1.1 mg/dL 1.9(A) 1.54(H) (None) 1.65(H)  AST 13 - 35 U/L 16 15 (None) (None)  Alk Phos 25 - 125 U/L 71 62 (None) (None)  Bilirubin 0.2 - 1.2 mg/dL (None) 0.4 (None) (None)  Glucose mg/dL 188 127(H) (None) 123(H)  Cholesterol - (None) (None) (None)  (None)  HDL cholesterol - (None) (None) (None) (None)  Triglycerides - (None) (None) (None) (None)  LDL Direct - (None) (None) (None) (None)  LDL Calc - (None) (None) (None) (None)  Total protein 6.1 - 8.1 g/dL (None) 6.0(L) (None) (None)  Albumin 3.6 - 5.1 g/dL (None) 3.7 (None) (None)  Some recent data might be hidden   Lab Results  Component Value Date   TSH 2.149 06/12/2015   TSH 4.25 05/01/2014   TSH 5.43 (H) 04/02/2014   Lab Results  Component Value Date   BUN 31 (A) 03/04/2016   BUN 43 (H) 01/14/2016   BUN 46 (H) 12/14/2015   Lab Results  Component Value Date   HGBA1C 7.7 (H) 01/14/2016   HGBA1C 7.7 (H) 11/04/2015   HGBA1C 7.9 (H) 05/22/2015    Assessment/Plan  1. Chronic kidney disease, stage III (moderate) - Basic metabolic panel; Future  2. Chronic diastolic heart failure, NYHA class 1 (HCC) - furosemide (LASIX) 20 MG tablet; One daily to treat edema  Dispense: 90 tablet; Refill: 3  3. Diabetes mellitus with nephropathy (HCC) - metFORMIN (GLUCOPHAGE) 500 MG tablet; One twice daily to control glucose  Dispense: 180 tablet; Refill: 4  4. Essential hypertension controlled  5. Anal prolapse - PROCTO-MED HC 2.5 % rectal cream; Apply to rectum 3 times daily for discomfort  Dispense: 30 g; Refill: 6  6. Anemia, unspecified type resolved

## 2016-03-09 ENCOUNTER — Encounter (HOSPITAL_COMMUNITY): Payer: Self-pay

## 2016-03-09 ENCOUNTER — Telehealth: Payer: Self-pay | Admitting: *Deleted

## 2016-03-09 ENCOUNTER — Emergency Department (HOSPITAL_COMMUNITY)
Admission: EM | Admit: 2016-03-09 | Discharge: 2016-03-09 | Disposition: A | Discharge status: Home / Self Care | Payer: Medicare Other | Attending: Emergency Medicine | Admitting: Emergency Medicine

## 2016-03-09 DIAGNOSIS — Z7984 Long term (current) use of oral hypoglycemic drugs: Secondary | ICD-10-CM | POA: Diagnosis not present

## 2016-03-09 DIAGNOSIS — Z955 Presence of coronary angioplasty implant and graft: Secondary | ICD-10-CM | POA: Insufficient documentation

## 2016-03-09 DIAGNOSIS — Z8673 Personal history of transient ischemic attack (TIA), and cerebral infarction without residual deficits: Secondary | ICD-10-CM | POA: Insufficient documentation

## 2016-03-09 DIAGNOSIS — Z87891 Personal history of nicotine dependence: Secondary | ICD-10-CM | POA: Insufficient documentation

## 2016-03-09 DIAGNOSIS — N183 Chronic kidney disease, stage 3 (moderate): Secondary | ICD-10-CM | POA: Insufficient documentation

## 2016-03-09 DIAGNOSIS — I2510 Atherosclerotic heart disease of native coronary artery without angina pectoris: Secondary | ICD-10-CM | POA: Diagnosis not present

## 2016-03-09 DIAGNOSIS — I5032 Chronic diastolic (congestive) heart failure: Secondary | ICD-10-CM | POA: Diagnosis not present

## 2016-03-09 DIAGNOSIS — K6289 Other specified diseases of anus and rectum: Secondary | ICD-10-CM | POA: Diagnosis present

## 2016-03-09 DIAGNOSIS — E1121 Type 2 diabetes mellitus with diabetic nephropathy: Secondary | ICD-10-CM | POA: Insufficient documentation

## 2016-03-09 DIAGNOSIS — I130 Hypertensive heart and chronic kidney disease with heart failure and stage 1 through stage 4 chronic kidney disease, or unspecified chronic kidney disease: Secondary | ICD-10-CM | POA: Diagnosis not present

## 2016-03-09 DIAGNOSIS — K623 Rectal prolapse: Secondary | ICD-10-CM | POA: Diagnosis not present

## 2016-03-09 DIAGNOSIS — I252 Old myocardial infarction: Secondary | ICD-10-CM | POA: Diagnosis not present

## 2016-03-09 MED ORDER — DOCUSATE SODIUM 100 MG PO CAPS: 100.0000 mg | ORAL_CAPSULE | Freq: Two times a day (BID) | ORAL | 0 refills | Status: DC

## 2016-03-09 NOTE — ED Provider Notes (Signed)
[facility]-EMERGENCY DEPT Provider Note   CSN: [id number] Arrival date & time: 03/09/16  1133     History   Chief Complaint Chief Complaint  Patient presents with  . Rectal Pain    HPI [name] is a 82 y.o. female.  HPI Patient presents to the emergency department with complaints of intermittent rectal prolapse and rectal pain over the past 3-4 weeks.  She denies abdominal pain.  She denies weight loss.  She believes this was caused by colonoscopy she had some time ago.  She reports she spoke with her doctor and a gastroenterologist about it and no one to an examination.  She is a retired Producer, television/film/videooperating room nurse.  She is frustrated.  She reports her pain is improved at this time.  She also reports incomplete emptying of her bladder.   Past Medical History:  Diagnosis Date  . Anemia   . Carotid artery occlusion   . Carotid bruit   . Chronic kidney disease (CKD), stage III (moderate)   . Coronary artery disease    Remote PCI. Had BMS to RCA and DES stent to Diagonal in Jan. 2012   . Diastolic heart failure    EF 55 to 60% per cath in Jan 2012  . DVT (deep venous thrombosis) (HCC)   . Gout   . Hyperlipidemia   . Hypertension   . Ischemic cardiomyopathy    with prior EF of 35%  . NSTEMI (non-ST elevated myocardial infarction) [facility](HCC) Jan 2012   with PCI to RCA and DX per Dr. Excell Seltzerooper  . Obesity   . Pneumonia 2016  . Proteinuria   . Stroke [facility](HCC) 2013   ?  mini    . TIA (transient ischemic attack)   . Type II diabetes mellitus (HCC)    long standing    Patient Active Problem List   Diagnosis Date Noted  . Pain in joint, lower leg 01/20/2016  . Ptosis 11/04/2015  . Glaucoma 08/25/2015  . Dementia 06/10/2015  . Expressive aphasia 05/26/2015  . Anal prolapse 02/25/2015  . Chronic kidney disease, stage III (moderate) 02/10/2014  . PAD (peripheral artery disease) (HCC) 01/17/2014  . Constipation 07/31/2013  . Atherosclerosis of native arteries of the extremities with  intermittent claudication-Right Leg 07/04/2013  . Anxiety disorder 02/21/2013  . Diabetes mellitus with nephropathy (HCC) 07/05/2012  . Occlusion and stenosis of carotid artery without mention of cerebral infarction 06/21/2012  . Coronary artery disease   . Chronic diastolic heart failure, NYHA class 1 (HCC)   . Hyperlipidemia   . Hypertension   . Carotid bruit   . Gout   . Obesity   . TIA (transient ischemic attack)     Past Surgical History:  Procedure Laterality Date  . ABDOMINAL AORTAGRAM N/A 01/17/2014   Procedure: ABDOMINAL Ronny FlurryAORTAGRAM;  Surgeon: Sherren Kernsharles E Fields, MD;  [facility];  Service: Cardiovascular;  Laterality: N/A;  . ANKLE FUSION Bilateral   . APPENDECTOMY    . CATARACT EXTRACTION, BILATERAL Bilateral   . CORONARY ANGIOPLASTY WITH STENT PLACEMENT  04/05/2010   distal RCA  . FRACTURE SURGERY Right 2005   bilateral ankles   . TONSILLECTOMY      OB History    No data available       Home Medications    Prior to Admission medications   Medication Sig Start Date End Date Taking? Authorizing Provider  BETIMOL 0.5 % ophthalmic solution INSTILL 1 DROP INTO THE LEFT EYE DAILY 01/27/16  Yes Merton BorderArthur  Hale DroneG Green, MD  cloNIDine (CATAPRES) 0.1 MG tablet Take 1 tablet (0.1 mg total) by mouth daily. For high blood pressure 09/04/14  Yes Sharon SellerJessica K Eubanks, NP  ezetimibe-simvastatin (VYTORIN) 10-20 MG tablet 1/2 tablet nightly to control cholesterol 05/26/15  Yes Kimber RelicArthur G Green, MD  losartan (COZAAR) 50 MG tablet TAKE 1 TABLET BY MOUTH EVERY DAY FOR HIGH BLOOD PRESSURE 02/24/16  Yes Kimber RelicArthur G Green, MD  LOTEMAX 0.5 % GEL PLACE 1 DROP IN THE LEFT EYE TWICE DAILY. 02/24/16  Yes Kimber RelicArthur G Green, MD  polyethylene glycol powder (GLYCOLAX/MIRALAX) powder DISSOLVE 17 GRAMS (1 CAPFUL) INTO 8 OUNCES OF LIQUID AND DRINK EVERY DAY AS NEEDED FOR BOWELS 05/18/15  Yes Kimber RelicArthur G Green, MD  [facility] 2.[facility] % rectal cream Apply to rectum 3 times daily for discomfort 03/08/16  Yes Kimber RelicArthur G Green,  MD  docusate sodium (COLACE) 100 MG capsule Take 1 capsule (100 mg total) by mouth every 12 (twelve) hours. 03/09/16   Azalia BilisKevin Lorre Opdahl, MD  furosemide (LASIX) 20 MG tablet One daily to treat edema 03/08/16   Kimber RelicArthur G Green, MD  metFORMIN (GLUCOPHAGE) 500 MG tablet One twice daily to control glucose 03/08/16   Kimber RelicArthur G Green, MD  nitroGLYCERIN (NITROSTAT) 0.4 MG SL tablet Place one under tongue for chest pain, up to 3 tablets 10/22/15   Historical Provider, MD    Family History Family History  Problem Relation Age of Onset  . Heart disease Neg Hx     both parents were murdered    Social History Social History  Substance Use Topics  . Smoking status: Former Games developermoker  . Smokeless tobacco: Never Used     Comment: "smoked in my teens; quit after 1 year or so"  . Alcohol use No     Allergies   Namzaric [memantine hcl-donepezil hcl]; Percocet [oxycodone-acetaminophen]; Sulfa antibiotics; Adhesive [tape]; Metformin and related; Tradjenta [linagliptin]; and Penicillins   Review of Systems Review of Systems  All other systems reviewed and are negative.    Physical Exam Updated Vital Signs BP 173/64 (BP Location: Right Arm)   Pulse 81   Temp 97.9 F (36.6 C) (Oral)   Resp 18   Ht 5' (1.524 m)   Wt 149 lb (67.6 kg)   SpO2 94%   BMI 29.10 kg/m   Physical Exam  Constitutional: She is oriented to person, place, and time. She appears well-developed and well-nourished. No distress.  HENT:  Head: Normocephalic and atraumatic.  Eyes: EOM are normal.  Neck: Normal range of motion.  Cardiovascular: Normal rate.   Pulmonary/Chest: Effort normal and breath sounds normal.  Abdominal: Soft. She exhibits no distension. There is no tenderness.  Genitourinary:  Genitourinary Comments: Small nonbleeding external hemorrhoids.  No rectal prolapse noted.  Rectal examination completed with brown stool.  No gross blood.  Musculoskeletal: Normal range of motion.  Neurological: She is alert and  oriented to person, place, and time.  Skin: Skin is warm and dry.  Psychiatric: She has a normal mood and affect. Judgment normal.  Nursing note and vitals reviewed.    ED Treatments / Results  Labs (all labs ordered are listed, but only abnormal results are displayed) Labs Reviewed - No data to display  EKG  EKG Interpretation None       Radiology No results found.  Procedures Procedures (including critical care time)  Medications Ordered in ED Medications - No data to display   Initial Impression / Assessment and Plan / ED Course  I  have reviewed the triage vital signs and the nursing notes.  Pertinent labs & imaging results that were available during my care of the patient were reviewed by me and considered in my medical decision making (see chart for details).  Clinical Course     Patient is overall well-appearing.  She has no evidence of rectal prolapse at this time.  She is requesting referral to a new GI team.  She's been provided this information.  Also given her the information for [place] [facility] however given her advanced age she is likely not a surgical candidate.  Home with follow-up.  She understands return the ER for new or worsening symptoms   Final Clinical Impressions(s) / ED Diagnoses   Final diagnoses:  Rectal prolapse    New Prescriptions New Prescriptions   DOCUSATE SODIUM (COLACE) 100 MG CAPSULE    Take 1 capsule (100 mg total) by mouth every 12 (twelve) hours.     Azalia Bilis, MD 03/09/16 [phone]

## 2016-03-09 NOTE — Telephone Encounter (Signed)
Ashley MoronJessica Walters, [facility] called and stated that patient had an appointment with you yesterday but she is afraid that the patient didn't voice her concerns at the appointment. Stated patient is on their heart management program. She stated that the patient told her that she felt like her bottom is hanging out and has to wear a diaper. Nurse feels like she has a prolapse rectum. Nurse wanted to know if she discussed this with Dr. Chilton SiGreen and patient told her that she didn't because she was embarrassed, she stated that she leaks stool all day and has to wear something. I reviewed Dr. Angelita InglesGreen's OV notes with Nurse. EPIC shows patient is in the ER Today.

## 2016-03-09 NOTE — ED Notes (Signed)
Rectum assessed no obvious prolapse is noted.

## 2016-03-09 NOTE — Telephone Encounter (Signed)
This problem was discussed at length and she supposedly has an appt next week with [facility]. She may ultimately need surgical referral, but heer medical conditions may be a deterrent to surgery.

## 2016-03-09 NOTE — ED Notes (Signed)
Bed: [id number] Expected date:  Expected time:  Means of arrival:  Comments: EMS- [facility], prolapsed rectum

## 2016-03-09 NOTE — ED Notes (Signed)
Bladder scan showed approx. 

## 2016-03-09 NOTE — ED Notes (Signed)
Pt denies pain at this time she stated she has urinated and now feel relieved. Pt has family and friends at her bedside.

## 2016-03-09 NOTE — ED Triage Notes (Signed)
Pt brought in from home by EMS who has been having a prolapsed rectum for a while and has been seeing a provider for managegement. Pt was at MD office yesterday for large amounts of bleeding, no action was taken as per EMS. Pt has came to the ED today for increased pain and reports no bleeding today. Per EMS pt has not taken anything for pain and reports that she  doesn't like taking pain medications.

## 2016-03-16 ENCOUNTER — Telehealth: Payer: Self-pay | Admitting: *Deleted

## 2016-03-16 MED ORDER — MELOXICAM 15 MG PO TABS: ORAL_TABLET | ORAL | 0 refills | Status: DC

## 2016-03-16 NOTE — Telephone Encounter (Signed)
Spoke with caregiver and she agreed and will pick up Rx. Faxed to pharmacy she requested-[facility].

## 2016-03-16 NOTE — Telephone Encounter (Signed)
Patient daughter, [name] called and stated that patient is complaining with her Right ankle and lower leg hurting all night. Patient is requesting something for the pain. No available appointments. Please Advise.

## 2016-03-16 NOTE — Telephone Encounter (Signed)
Call in prescription for meloxicam 15 mg (30) One daly for pain in foot and ankle.   She should also get tube of Aspercream with Lidocaine OTC to be used on the painful area at meals and at bed until pain is inproved

## 2016-04-06 ENCOUNTER — Ambulatory Visit: Payer: Medicare Other | Admitting: Nurse Practitioner

## 2016-04-11 ENCOUNTER — Ambulatory Visit: Payer: Medicare Other | Admitting: Nurse Practitioner

## 2016-04-11 NOTE — Progress Notes (Deleted)
CARDIOLOGY OFFICE NOTE  Date:  04/11/2016    [name] Date of Birth: 1927-06-01 Medical Record #[id number]  PCP:  Murray Hodgkins, MD  Cardiologist:  Kirt Boys    No chief complaint on file.   History of Present Illness: [name] is a 83 y.o. female who presents today for a follow up visit. Seen for Dr. Excell Seltzer. She is a former patient of Dr. Ronnald Nian.   She has multiple issues which include CAD, remote PCI, BMS to the RCA and DES to the DX in January of 2012. She has had longstanding diabetes, HTN, HLD, diastolic heart failure with EF 55 to 60% per cath back in 2012 but has been as low as 35%, PVD (followed by Dr. Darrick Penna), gout, CKD and chronic anemia.   I have known [name] for over 20 years - always seems to have an issue with her social situation, her medicines or her doctors in some shape or fashion.   Seen back in November of 2014 by PCP for some ongoing issues with anxiety/BP. Has had lisinopril added back along with some Xanax/Celexa. Having personal issues with a live in man that she does not want to evict as well as family issues in general. She has always cared for many animals in and at her home and had lots of issues with family.   I saw her back in January of 2015. Medicines had been adjusted for her BP. Still with lots of social issues with her home situation. I saw her in May of 2015- she had seen Dr. Darrick Penna recently for a foot ulcer - managed conservatively. She ultimately underwent bilateral common iliac stenting on January 17, 2014. 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 She did have a postprocedure hematoma in the groin that did resolve. She has been seen at the wound center for intermittent debridement of her right medial malleolus ulcer. She was placed in an Radio broadcast assistant.   Seen in December of 2015 - off lots of  her medicines - not clear as to why. BP was up. Clonidine and Norvasc were restarted. She was on a lower dose of ARB. On follow up by me in January - she was doing better. Cardiac status stable. She came for a follow up in March of 2016- she was febrile - T of 102. Sent on to the ER - treated for UTI and possible pneumonia. I last saw her in August and she was doing ok but seemed to be declining in a generalized manner. Again - lots of issues with her home life - not really clear as to what to believe and not believe - she would not let her family come in the room.   Comes in today. Here alone.   Past Medical History:  Diagnosis Date  . Anemia   . Carotid artery occlusion   . Carotid bruit   . Chronic kidney disease (CKD), stage III (moderate)   . Coronary artery disease    Remote PCI. Had BMS to RCA and DES stent to Diagonal in Jan. 2012   . Diastolic heart failure    EF 55 to 60% per cath in Jan 2012  . DVT (deep venous thrombosis) (HCC)   . Gout   . Hyperlipidemia   . Hypertension   . Ischemic cardiomyopathy    with prior EF of 35%  .  NSTEMI (non-ST elevated myocardial infarction) [facility](HCC) Jan 2012   with PCI to RCA and DX per Dr. Excell Seltzerooper  . Obesity   . Pneumonia 2016  . Proteinuria   . Stroke [facility](HCC) 2013   ?  mini    . TIA (transient ischemic attack)   . Type II diabetes mellitus (HCC)    long standing    Past Surgical History:  Procedure Laterality Date  . ABDOMINAL AORTAGRAM N/A 01/17/2014   Procedure: ABDOMINAL Ronny FlurryAORTAGRAM;  Surgeon: Sherren Kernsharles E Fields, MD;  [facility];  Service: Cardiovascular;  Laterality: N/A;  . ANKLE FUSION Bilateral   . APPENDECTOMY    . CATARACT EXTRACTION, BILATERAL Bilateral   . CORONARY ANGIOPLASTY WITH STENT PLACEMENT  04/05/2010   distal RCA  . FRACTURE SURGERY Right 2005   bilateral ankles   . TONSILLECTOMY       Medications: Current Outpatient Prescriptions  Medication Sig Dispense Refill  . BETIMOL 0.5 % ophthalmic solution  INSTILL 1 DROP INTO THE LEFT EYE DAILY 10 mL 1  . cloNIDine (CATAPRES) 0.1 MG tablet Take 1 tablet (0.1 mg total) by mouth daily. For high blood pressure 30 tablet 4  . docusate sodium (COLACE) 100 MG capsule Take 1 capsule (100 mg total) by mouth every 12 (twelve) hours. 60 capsule 0  . ezetimibe-simvastatin (VYTORIN) 10-20 MG tablet 1/2 tablet nightly to control cholesterol 45 tablet 4  . furosemide (LASIX) 20 MG tablet One daily to treat edema 30 tablet 3  . losartan (COZAAR) 50 MG tablet TAKE 1 TABLET BY MOUTH EVERY DAY FOR HIGH BLOOD PRESSURE 90 tablet 1  . LOTEMAX 0.5 % GEL PLACE 1 DROP IN THE LEFT EYE TWICE DAILY. 5 g 1  . meloxicam (MOBIC) 15 MG tablet Take one tablet by mouth daily for pain in foot and ankle 30 tablet 0  . metFORMIN (GLUCOPHAGE) 500 MG tablet One twice daily to control glucose 60 tablet 4  . nitroGLYCERIN (NITROSTAT) 0.4 MG SL tablet Place one under tongue for chest pain, up to 3 tablets    . polyethylene glycol powder (GLYCOLAX/MIRALAX) powder DISSOLVE 17 GRAMS (1 CAPFUL) INTO 8 OUNCES OF LIQUID AND DRINK EVERY DAY AS NEEDED FOR BOWELS 850 g 5  . PROCTO-MED HC 2.5 % rectal cream Apply to rectum 3 times daily for discomfort 30 g 2   No current facility-administered medications for this visit.     Allergies: Allergies  Allergen Reactions  . Namzaric [Memantine Hcl-Donepezil Hcl] Nausea Only    confusion  . Percocet [Oxycodone-Acetaminophen] Other (See Comments)    loosy goosy  . Sulfa Antibiotics Other (See Comments)    Tongue swells  . Adhesive [Tape] Other (See Comments)    Tears skin.  Please use "paper" tape  . Metformin And Related Other (See Comments)    Abnormal Kidney Functions   . Tradjenta [Linagliptin]     Low back ache, resolved once medication stopped   . Penicillins Hives    Tolerates Ceftriaxone, Has patient had a PCN reaction causing immediate rash, facial/tongue/throat swelling, SOB or lightheadedness with hypotension: Unknown Has patient had  a PCN reaction causing severe rash involving mucus membranes or skin necrosis: No Has patient had a PCN reaction that required hospitalization No Has patient had a PCN reaction occurring within the last 10 years: No If all of the above answers are "NO", then may proceed with Cephalosporin use.     Social History: The patient  reports that she has quit smoking. She has  never used smokeless tobacco. She reports that she does not drink alcohol or use drugs.   Family History: The patient's ***family history is not on file.   Review of Systems: Please see the history of present illness.   Otherwise, the review of systems is positive for {NONE DEFAULTED:18576::"none"}.   All other systems are reviewed and negative.   Physical Exam: VS:  There were no vitals taken for this visit. Marland Kitchen  BMI There is no height or weight on file to calculate BMI.  Wt Readings from Last 3 Encounters:  03/09/16 149 lb (67.6 kg)  03/08/16 149 lb (67.6 kg)  02/26/16 147 lb (66.7 kg)    General: Pleasant. Well developed, well nourished and in no acute distress.   HEENT: Normal.  Neck: Supple, no JVD, carotid bruits, or masses noted.  Cardiac: ***Regular rate and rhythm. No murmurs, rubs, or gallops. No edema.  Respiratory:  Lungs are clear to auscultation bilaterally with normal work of breathing.  GI: Soft and nontender.  MS: No deformity or atrophy. Gait and ROM intact.  Skin: Warm and dry. Color is normal.  Neuro:  Strength and sensation are intact and no gross focal deficits noted.  Psych: Alert, appropriate and with normal affect.   LABORATORY DATA:  EKG:  EKG {ACTION; IS/IS ZOX:09604540} ordered today. This demonstrates ***.  Lab Results  Component Value Date   WBC 5.5 03/04/2016   HGB 11.6 (A) 03/04/2016   HCT 36 03/04/2016   PLT 189 03/04/2016   GLUCOSE 127 (H) 01/14/2016   CHOL 143 03/09/2015   TRIG 170 (H) 03/09/2015   HDL 35 (L) 03/09/2015   LDLCALC 74 03/09/2015   ALT 7 03/04/2016   AST  16 03/04/2016   NA 144 03/04/2016   K 5.7 (A) 03/04/2016   CL 113 (H) 01/14/2016   CREATININE 1.9 (A) 03/04/2016   BUN 31 (A) 03/04/2016   CO2 20 01/14/2016   TSH 2.149 06/12/2015   INR 1.09 04/15/2010   HGBA1C 7.7 (H) 01/14/2016   MICROALBUR 45.0 01/14/2016    BNP (last 3 results)  Recent Labs  07/02/15 0757  BNP 246.4*    ProBNP (last 3 results) No results for input(s): PROBNP in the last 8760 hours.   Other Studies Reviewed Today:   Assessment/Plan: 1. HTN - BP looks good on her current regimen. I have not made any changes today  2. CAD - no symptoms reported - will continue with medical/conservative management   3. HLD - on statin therapy  4. PVD - followed by [facility] with Dr. Darrick Penna   5. Hyperkalemia - off ARB  6. Diastolic HF - she seems compensated to me. I have left her on current regimen.  7. Dementia - not really clear to me what to believe about her home situation.    Current medicines are reviewed with the patient today.  The patient does not have concerns regarding medicines other than what has been noted above.  The following changes have been made:  See above.  Labs/ tests ordered today include:   No orders of the defined types were placed in this encounter.    Disposition:   FU with *** in {gen number 9-81:191478} {Days to years:10300}.   Patient is agreeable to this plan and will call if any problems develop in the interim.   SignedNorma Fredrickson, NP  04/11/2016 1:33 PM  [facility] [address] [place], [place]  [address] Phone: [phone]  Fax: [phone]

## 2016-04-13 ENCOUNTER — Encounter: Payer: Self-pay | Admitting: Nurse Practitioner

## 2016-04-18 ENCOUNTER — Other Ambulatory Visit: Payer: Self-pay | Admitting: Internal Medicine

## 2016-04-20 ENCOUNTER — Ambulatory Visit (INDEPENDENT_AMBULATORY_CARE_PROVIDER_SITE_OTHER): Payer: Medicare Other

## 2016-04-20 ENCOUNTER — Ambulatory Visit (INDEPENDENT_AMBULATORY_CARE_PROVIDER_SITE_OTHER): Payer: Medicare Other | Admitting: Internal Medicine

## 2016-04-20 ENCOUNTER — Encounter: Payer: Self-pay | Admitting: Internal Medicine

## 2016-04-20 VITALS — BP 172/84 | HR 71 | Temp 97.1°F | Ht 60.0 in | Wt 142.0 lb

## 2016-04-20 VITALS — BP 172/84 | HR 71 | Temp 97.1°F | Ht 60.0 in | Wt 142.4 lb

## 2016-04-20 DIAGNOSIS — E1121 Type 2 diabetes mellitus with diabetic nephropathy: Secondary | ICD-10-CM

## 2016-04-20 DIAGNOSIS — Z0000 Encounter for general adult medical examination without abnormal findings: Secondary | ICD-10-CM

## 2016-04-20 DIAGNOSIS — I5032 Chronic diastolic (congestive) heart failure: Secondary | ICD-10-CM | POA: Diagnosis not present

## 2016-04-20 DIAGNOSIS — N183 Chronic kidney disease, stage 3 unspecified: Secondary | ICD-10-CM

## 2016-04-20 DIAGNOSIS — E785 Hyperlipidemia, unspecified: Secondary | ICD-10-CM

## 2016-04-20 DIAGNOSIS — I2510 Atherosclerotic heart disease of native coronary artery without angina pectoris: Secondary | ICD-10-CM

## 2016-04-20 DIAGNOSIS — K622 Anal prolapse: Secondary | ICD-10-CM

## 2016-04-20 DIAGNOSIS — I2583 Coronary atherosclerosis due to lipid rich plaque: Secondary | ICD-10-CM

## 2016-04-20 DIAGNOSIS — I10 Essential (primary) hypertension: Secondary | ICD-10-CM | POA: Diagnosis not present

## 2016-04-20 LAB — HEMOGLOBIN A1C
HEMOGLOBIN A1C: 7.7 % — AB (ref <5.7)
Mean Plasma Glucose: 174 mg/dL

## 2016-04-20 NOTE — Patient Instructions (Addendum)
[name] , Thank you for taking time to come for your Medicare Wellness Visit. I appreciate your ongoing commitment to your health goals. Please review the following plan we discussed and let me know if I can assist you in the future.   These are the goals we discussed: Goals    . Increase physical activity          Starting 04/20/16, I will attempt to increase my physical activity, to maintain my independence.        This is a list of the screening recommended for you and due dates:  Health Maintenance  Topic Date Due  . Complete foot exam   06/29/1937  . Tetanus Vaccine  04/21/2017*  . Shingles Vaccine  04/21/2018*  . Pneumonia vaccines (2 of 2 - PCV13) 10/19/2023*  . Hemoglobin A1C  07/14/2016  . Eye exam for diabetics  09/29/2016  . Flu Shot  Completed  . DEXA scan (bone density measurement)  Completed  *Topic was postponed. The date shown is not the original due date.  Preventive Care for Adults  A healthy lifestyle and preventive care can promote health and wellness. Preventive health guidelines for adults include the following key practices.  . A routine yearly physical is a good way to check with your health care provider about your health and preventive screening. It is a chance to share any concerns and updates on your health and to receive a thorough exam.  . Visit your dentist for a routine exam and preventive care every 6 months. Brush your teeth twice a day and floss once a day. Good oral hygiene prevents tooth decay and gum disease.  . The frequency of eye exams is based on your age, health, family medical history, use  of contact lenses, and other factors. Follow your health care provider's ecommendations for frequency of eye exams.  . Eat a healthy diet. Foods like vegetables, fruits, whole grains, low-fat dairy products, and lean protein foods contain the nutrients you need without too many calories. Decrease your intake of foods high in solid fats, added  sugars, and salt. Eat the right amount of calories for you. Get information about a proper diet from your health care provider, if necessary.  . Regular physical exercise is one of the most important things you can do for your health. Most adults should get at least 150 minutes of moderate-intensity exercise (any activity that increases your heart rate and causes you to sweat) each week. In addition, most adults need muscle-strengthening exercises on 2 or more days a week.  Silver Sneakers may be a benefit available to you. To determine eligibility, you may visit the website: www.silversneakers.com or contact program at 949-521-0723 Mon-Fri between 8AM-8PM.   . Maintain a healthy weight. The body mass index (BMI) is a screening tool to identify possible weight problems. It provides an estimate of body fat based on height and weight. Your health care provider can find your BMI and can help you achieve or maintain a healthy weight.   For adults 20 years and older: ? A BMI below 18.5 is considered underweight. ? A BMI of 18.5 to 24.9 is normal. ? A BMI of 25 to 29.9 is considered overweight. ? A BMI of 30 and above is considered obese.   . Maintain normal blood lipids and cholesterol levels by exercising and minimizing your intake of saturated fat. Eat a balanced diet with plenty of fruit and vegetables. Blood tests for lipids and cholesterol should begin  at age 81 and be repeated every 5 years. If your lipid or cholesterol levels are high, you are over 50, or you are at high risk for heart disease, you may need your cholesterol levels checked more frequently. Ongoing high lipid and cholesterol levels should be treated with medicines if diet and exercise are not working.  . If you smoke, find out from your health care provider how to quit. If you do not use tobacco, please do not start.  . If you choose to drink alcohol, please do not consume more than 2 drinks per day. One drink is considered to  be 12 ounces (355 mL) of beer, 5 ounces (148 mL) of wine, or 1.5 ounces (44 mL) of liquor.  . If you are 4355-662 years old, ask your health care provider if you should take aspirin to prevent strokes.  . Use sunscreen. Apply sunscreen liberally and repeatedly throughout the day. You should seek shade when your shadow is shorter than you. Protect yourself by wearing long sleeves, pants, a wide-brimmed hat, and sunglasses year round, whenever you are outdoors.  . Once a month, do a whole body skin exam, using a mirror to look at the skin on your back. Tell your health care provider of new moles, moles that have irregular borders, moles that are larger than a pencil eraser, or moles that have changed in shape or color.

## 2016-04-20 NOTE — Progress Notes (Signed)
Facility  [facility]    Place of Service:   OFFICE    Allergies  Allergen Reactions  . Namzaric [Memantine Hcl-Donepezil Hcl] Nausea Only    confusion  . Percocet [Oxycodone-Acetaminophen] Other (See Comments)    loosy goosy  . Sulfa Antibiotics Other (See Comments)    Tongue swells  . Adhesive [Tape] Other (See Comments)    Tears skin.  Please use "paper" tape  . Metformin And Related Other (See Comments)    Abnormal Kidney Functions   . Tradjenta [Linagliptin]     Low back ache, resolved once medication stopped   . Penicillins Hives    Tolerates Ceftriaxone, Has patient had a PCN reaction causing immediate rash, facial/tongue/throat swelling, SOB or lightheadedness with hypotension: Unknown Has patient had a PCN reaction causing severe rash involving mucus membranes or skin necrosis: No Has patient had a PCN reaction that required hospitalization No Has patient had a PCN reaction occurring within the last 10 years: No If all of the above answers are "NO", then may proceed with Cephalosporin use.     Chief Complaint  Patient presents with  . Medical Management of Chronic Issues    6 week medication management CKD, CHF, diabetes    HPI:   Anal prolapse - seen in ER  03/09/16 and had recommendation for referral to General Surgery  Essential hypertension - elevated SBP today  Chronic kidney disease, stage III (moderate) - stable  Chronic diastolic heart failure, NYHA class 1 ([place]) - compensated  Coronary artery disease due to lipid rich plaque - asymtomatic  Diabetes mellitus with nephropathy ([place]) - needs follow up lab: Hemoglobin A1c, Comprehensive metabolic panel  Hyperlipidemia, unspecified hyperlipidemia type - needs followup Lipid panel     Medications: Patient's Medications  New Prescriptions   No medications on file  Previous Medications   BETIMOL 0.5 % OPHTHALMIC SOLUTION    INSTILL 1 DROP INTO THE LEFT EYE DAILY   CLONIDINE (CATAPRES) 0.1 MG TABLET     Take 1 tablet (0.1 mg total) by mouth daily. For high blood pressure   DOCUSATE SODIUM (COLACE) 100 MG CAPSULE    Take 1 capsule (100 mg total) by mouth every 12 (twelve) hours.   EZETIMIBE-SIMVASTATIN (VYTORIN) 10-20 MG TABLET    1/2 tablet nightly to control cholesterol   FUROSEMIDE (LASIX) 20 MG TABLET    One daily to treat edema   LOSARTAN (COZAAR) 50 MG TABLET    TAKE 1 TABLET BY MOUTH EVERY DAY FOR HIGH BLOOD PRESSURE   LOTEMAX 0.5 % GEL    PLACE 1 DROP IN THE LEFT EYE TWICE DAILY.   MELOXICAM (MOBIC) 15 MG TABLET    Take one tablet by mouth daily for pain in foot and ankle   METFORMIN (GLUCOPHAGE) 500 MG TABLET    One twice daily to control glucose   NITROGLYCERIN (NITROSTAT) 0.4 MG SL TABLET    Place one under tongue for chest pain, up to 3 tablets   POLYETHYLENE GLYCOL POWDER (GLYCOLAX/MIRALAX) POWDER    DISSOLVE 17 GRAMS (1 CAPFUL) INTO 8 OUNCES OF LIQUID AND DRINK EVERY DAY AS NEEDED FOR BOWELS  Modified Medications   No medications on file  Discontinued Medications   No medications on file    Review of Systems  Constitutional: Negative for activity change, appetite change, chills, diaphoresis, fatigue, fever and unexpected weight change.  HENT: Positive for sore throat (Acute issue). Negative for congestion, ear discharge, ear pain, hearing loss, postnasal drip, rhinorrhea, tinnitus,  trouble swallowing and voice change.   Eyes: Positive for visual disturbance (corrective lenses). Negative for pain, redness and itching.       History of glaucoma and ptosis of the left eye.a  Respiratory: Negative for cough, choking, shortness of breath and wheezing.   Cardiovascular: Positive for leg swelling. Negative for chest pain and palpitations.       History of chronic diastolic heart failure  Gastrointestinal: Positive for abdominal pain and constipation. Negative for abdominal distention, anal bleeding, diarrhea, nausea and rectal pain.       Rectal prolapse  Endocrine: Negative for  cold intolerance, heat intolerance, polydipsia, polyphagia and polyuria.  Genitourinary: Negative for difficulty urinating, dysuria, flank pain, frequency, hematuria, pelvic pain, urgency and vaginal discharge.       Indwelling Foley catheter. Mass at rectum following straining CKD 3  Musculoskeletal: Positive for gait problem. Negative for arthralgias, back pain, myalgias, neck pain and neck stiffness.  Skin: Negative for color change, pallor, rash and wound.  Allergic/Immunologic: Negative.   Neurological: Negative for dizziness, tremors, seizures, syncope, weakness, light-headedness, numbness and headaches.       Mild-moderate dementia  Hematological: Negative for adenopathy. Does not bruise/bleed easily.       Chronic Anemia  Psychiatric/Behavioral: Positive for confusion. Negative for agitation, behavioral problems, dysphoric mood, hallucinations, sleep disturbance and suicidal ideas. The patient is not nervous/anxious and is not hyperactive.     Vitals:   04/20/16 1154  BP: (!) 172/84  Pulse: 71  Temp: 97.1 F (36.2 C)  TempSrc: Oral  SpO2: 97%  Weight: 142 lb (64.4 kg)  Height: 5' (1.524 m)   Body mass index is 27.73 kg/m. Wt Readings from Last 3 Encounters:  04/20/16 142 lb (64.4 kg)  04/20/16 142 lb 6.4 oz (64.6 kg)  03/09/16 149 lb (67.6 kg)      Physical Exam  Constitutional: She is oriented to person, place, and time. She appears well-developed and well-nourished. No distress.  Frail elderly female, NAD  HENT:  Head: Normocephalic and atraumatic.  Right Ear: External ear normal.  Left Ear: External ear normal.  Nose: Nose normal.  Mouth/Throat: Oropharynx is clear and moist. No oropharyngeal exudate.  Eyes: Conjunctivae and EOM are normal. Pupils are equal, round, and reactive to light. No scleral icterus.  Glasses; difficulty reading list of medications  Neck: Normal range of motion. Neck supple. No JVD present. No tracheal deviation present. No thyromegaly  present.  Cardiovascular: Normal rate, regular rhythm and intact distal pulses.  Exam reveals no gallop and no friction rub.   Murmur heard. 2+ pitting edema present bilateral lower legs  Pulmonary/Chest: Effort normal and breath sounds normal. No respiratory distress. She has no wheezes. She has no rales. She exhibits no tenderness.  Abdominal: Soft. Bowel sounds are normal. She exhibits no distension and no mass. There is no tenderness.  Genitourinary:  Genitourinary Comments: Loose anal sphincter. No palpable rectal mass. With straining, there is some mucosal eversion. Removed Foley catheter during office visit 06/24/15.  Musculoskeletal: Normal range of motion. She exhibits no tenderness. Edema: 2+ bipedal.  Lymphadenopathy:    She has no cervical adenopathy.  Neurological: She is alert and oriented to person, place, and time. No cranial nerve deficit. Coordination normal.  Slow unstable gait 06/10/15 MMSE 20/30. Failed clock drawing. 01/20/16 MMSE 25/30. Failed clock drawing.  Skin: Skin is warm and dry. No rash noted. She is not diaphoretic. No erythema. There is pallor.  Psychiatric: She has a normal mood and  affect. Her behavior is normal. Judgment and thought content normal.    Labs reviewed: Lab Summary Latest Ref Rng & Units 03/04/2016 01/14/2016 12/21/2015 12/14/2015  Hemoglobin 12.0 - 16.0 g/dL 11.6(A) (None) 11.3(L) 11.8(L)  Hematocrit 36 - 46 % 36 (None) 34.3(L) 35.8(L)  White count 10:3/mL 5.5 (None) 4.5 5.1  Platelet count 150 - 399 K/L 189 (None) 183 PLATELET CLUMPS NOTED ON SMEAR, UNABLE TO ESTIMATE  Sodium 137 - 147 mmol/L 144 142 (None) 145  Potassium 3.4 - 5.3 mmol/L 5.7(A) 4.9 (None) 4.9  Calcium 8.6 - 10.4 mg/dL (None) 8.6 (None) 8.8(L)  Phosphorus - (None) (None) (None) (None)  Creatinine 0.5 - 1.1 mg/dL 1.9(A) 1.54(H) (None) 1.65(H)  AST 13 - 35 U/L 16 15 (None) (None)  Alk Phos 25 - 125 U/L 71 62 (None) (None)  Bilirubin 0.2 - 1.2 mg/dL (None) 0.4 (None) (None)    Glucose mg/dL 188 127(H) (None) 123(H)  Cholesterol - (None) (None) (None) (None)  HDL cholesterol - (None) (None) (None) (None)  Triglycerides - (None) (None) (None) (None)  LDL Direct - (None) (None) (None) (None)  LDL Calc - (None) (None) (None) (None)  Total protein 6.1 - 8.1 g/dL (None) 6.0(L) (None) (None)  Albumin 3.6 - 5.1 g/dL (None) 3.7 (None) (None)  Some recent data might be hidden   Lab Results  Component Value Date   TSH 2.149 06/12/2015   TSH 4.25 05/01/2014   TSH 5.43 (H) 04/02/2014   Lab Results  Component Value Date   BUN 31 (A) 03/04/2016   BUN 43 (H) 01/14/2016   BUN 46 (H) 12/14/2015   Lab Results  Component Value Date   HGBA1C 7.7 (H) 01/14/2016   HGBA1C 7.7 (H) 11/04/2015   HGBA1C 7.9 (H) 05/22/2015    Assessment/Plan  1. Anal prolapse - Ambulatory referral to General Surgery  2. Essential hypertension - Comprehensive metabolic panel  3. Chronic kidney disease, stage III (moderate) - Comprehensive metabolic panel  4. Chronic diastolic heart failure, NYHA class 1 (HCC) The current medical regimen is effective;  continue present plan and medications.  5. Coronary artery disease due to lipid rich plaque asymptomatic  6. Diabetes mellitus with nephropathy ([place]) The current medical regimen is effective;  continue present plan and medications. - Comprehensive metabolic panel - Hemoglobin A1c  7. Hyperlipidemia, unspecified hyperlipidemia type - Lipid panel

## 2016-04-20 NOTE — Progress Notes (Signed)
Subjective:   [name] is a 83 y.o. female who presents for an Initial Medicare Annual Wellness Visit.  Review of Systems    Cardiac Risk Factors include: advanced age (>76men, >22 women);diabetes mellitus;family history of premature cardiovascular disease;dyslipidemia;hypertension     Objective:    Today's Vitals   04/20/16 1055  BP: (!) 172/84  Pulse: 71  Temp: 97.1 F (36.2 C)  TempSrc: Oral  SpO2: 97%  Weight: 142 lb 6.4 oz (64.6 kg)  Height: 5' (1.524 m)   Body mass index is 27.81 kg/m.   Current Medications (verified) Outpatient Encounter Prescriptions as of 04/20/2016  Medication Sig  . BETIMOL 0.5 % ophthalmic solution INSTILL 1 DROP INTO THE LEFT EYE DAILY  . cloNIDine (CATAPRES) 0.1 MG tablet Take 1 tablet (0.1 mg total) by mouth daily. For high blood pressure  . docusate sodium (COLACE) 100 MG capsule Take 1 capsule (100 mg total) by mouth every 12 (twelve) hours.  Marland Kitchen ezetimibe-simvastatin (VYTORIN) 10-20 MG tablet 1/2 tablet nightly to control cholesterol  . furosemide (LASIX) 20 MG tablet One daily to treat edema  . losartan (COZAAR) 50 MG tablet TAKE 1 TABLET BY MOUTH EVERY DAY FOR HIGH BLOOD PRESSURE  . LOTEMAX 0.5 % GEL PLACE 1 DROP IN THE LEFT EYE TWICE DAILY.  . meloxicam (MOBIC) 15 MG tablet Take one tablet by mouth daily for pain in foot and ankle  . nitroGLYCERIN (NITROSTAT) 0.4 MG SL tablet Place one under tongue for chest pain, up to 3 tablets  . polyethylene glycol powder (GLYCOLAX/MIRALAX) powder DISSOLVE 17 GRAMS (1 CAPFUL) INTO 8 OUNCES OF LIQUID AND DRINK EVERY DAY AS NEEDED FOR BOWELS  . metFORMIN (GLUCOPHAGE) 500 MG tablet One twice daily to control glucose (Patient not taking: Reported on 04/20/2016)  . [DISCONTINUED] PROCTO-MED HC 2.5 % rectal cream Apply to rectum 3 times daily for discomfort   No facility-administered encounter medications on file as of 04/20/2016.     Allergies (verified) Namzaric [memantine hcl-donepezil hcl];  Percocet [oxycodone-acetaminophen]; Sulfa antibiotics; Adhesive [tape]; Metformin and related; Tradjenta [linagliptin]; and Penicillins   History: Past Medical History:  Diagnosis Date  . Anemia   . Carotid artery occlusion   . Carotid bruit   . Chronic kidney disease (CKD), stage III (moderate)   . Coronary artery disease    Remote PCI. Had BMS to RCA and DES stent to Diagonal in Jan. 2012   . Diastolic heart failure    EF 55 to 60% per cath in Jan 2012  . DVT (deep venous thrombosis) (HCC)   . Gout   . Hyperlipidemia   . Hypertension   . Ischemic cardiomyopathy    with prior EF of 35%  . NSTEMI (non-ST elevated myocardial infarction) [facility]) Jan 2012   with PCI to RCA and DX per Dr. Excell Seltzer  . Obesity   . Pneumonia 2016  . Proteinuria   . Stroke [facility]) 2013   ?  mini    . TIA (transient ischemic attack)   . Type II diabetes mellitus (HCC)    long standing   Past Surgical History:  Procedure Laterality Date  . ABDOMINAL AORTAGRAM N/A 01/17/2014   Procedure: ABDOMINAL Ronny Flurry;  Surgeon: Sherren Kerns, MD;  Location: [facility] CATH LAB;  Service: Cardiovascular;  Laterality: N/A;  . ANKLE FUSION Bilateral   . APPENDECTOMY    . CATARACT EXTRACTION, BILATERAL Bilateral   . CORONARY ANGIOPLASTY WITH STENT PLACEMENT  04/05/2010   distal RCA  . FRACTURE SURGERY  Right 2005   bilateral ankles   . TONSILLECTOMY     History reviewed. No pertinent family history. Social History   Occupational History  . retired OR Engineer, civil (consulting)nurse    Social History Main Topics  . Smoking status: Former Games developermoker  . Smokeless tobacco: Never Used     Comment: "smoked in my teens; quit after 1 year or so"  . Alcohol use No  . Drug use: No  . Sexual activity: No    Tobacco Counseling Counseling given: No   Activities of Daily Living In your present state of health, do you have any difficulty performing the following activities: 04/20/2016 06/12/2015  Hearing? Malvin JohnsY Y  Vision? Y Y  Difficulty concentrating or  making decisions? N N  Walking or climbing stairs? N Y  Dressing or bathing? N Y  Doing errands, shopping? Malvin JohnsY Y  Preparing Food and eating ? N -  Using the Toilet? N -  In the past six months, have you accidently leaked urine? Y -  Do you have problems with loss of bowel control? Y -  Managing your Medications? N -  Managing your Finances? N -  Housekeeping or managing your Housekeeping? N -  Some recent data might be hidden    Immunizations and Health Maintenance Immunization History  Administered Date(s) Administered  . Influenza Split 12/20/2010, 12/08/2011  . Influenza,inj,Quad PF,36+ Mos 01/30/2013, 12/04/2014, 12/21/2015  . Pneumococcal Polysaccharide-23 06/29/1994  . Rabies, IM 12/22/2011, 12/25/2011   Health Maintenance Due  Topic Date Due  . FOOT EXAM  06/29/1937    Patient Care Team: Kimber RelicArthur G Green, MD as PCP - General (Internal Medicine) Rosalio MacadamiaLori C Gerhardt, NP as Nurse Practitioner (Cardiology) Sherrie GeorgeJohn D Matthews, MD as Consulting Physician (Ophthalmology) Kermit Baloiffany L Reed, DO as Consulting Physician (Geriatric Medicine) Sherren Kernsharles E Fields, MD as Consulting Physician (Vascular Surgery) Bernette Redbirdobert Buccini, MD as Consulting Physician (Gastroenterology) Tonny BollmanMichael Cooper, MD as Consulting Physician (Cardiology) Karie SodaSteven Gross, MD as Consulting Physician (General Surgery)  Indicate any recent Medical Services you may have received from other than [facility] providers in the past year (date may be approximate).     Assessment:   This is a routine wellness examination for [name].  Hearing/Vision screen Hearing Screening Comments: Last hearing screen done last year. Pt has an appointment to have her hearing aids checked.  Vision Screening Comments: Pt is due for eye exam with Dr. Ashley RoyaltyMatthews. Pt states she will make the appointment.   Dietary issues and exercise activities discussed: Current Exercise Habits: Home exercise routine, Type of exercise: stretching;walking, Time (Minutes): 20,  Frequency (Times/Week): 3, Weekly Exercise (Minutes/Week): 60, Intensity: Mild, Exercise limited by: Other - see comments (Does not have any transportation.)  Goals    . Increase physical activity          Starting 04/20/16, I will attempt to increase my physical activity, to maintain my independence.       Depression Screen PHQ 2/9 Scores 04/20/2016 02/19/2016 06/24/2015 12/04/2014 10/31/2013 07/31/2013 07/04/2012  PHQ - 2 Score 0 0 0 0 0 0 0    Fall Risk Fall Risk  04/20/2016 02/19/2016 12/21/2015 10/06/2015 08/25/2015  Falls in the past year? No No No No No    Cognitive Function: MMSE - Mini Mental State Exam 04/20/2016 01/20/2016 06/10/2015 10/31/2013  Not completed: (No Data) - (No Data) -  Orientation to time - 3 1 3   Orientation to Place - 4 4 5   Registration - 3 3 3   Attention/ Calculation - 4 3 2  Recall - 3 1 1   Language- name 2 objects - 2 2 2   Language- repeat - 1 1 1   Language- follow 3 step command - 3 3 3   Language- read & follow direction - 1 1 1   Write a sentence - 1 0 1  Copy design - 0 1 0  Total score - 25 20 22         Screening Tests Health Maintenance  Topic Date Due  . FOOT EXAM  06/29/1937  . TETANUS/TDAP  04/21/2017 (Originally 06/28/1946)  . ZOSTAVAX  04/21/2018 (Originally 06/28/1989)  . PNA vac Low Risk Adult (2 of 2 - PCV13) 10/19/2023 (Originally 06/29/1995)  . HEMOGLOBIN A1C  07/14/2016  . OPHTHALMOLOGY EXAM  09/29/2016  . INFLUENZA VACCINE  Completed  . DEXA SCAN  Completed      Plan:    I have personally reviewed and addressed the Medicare Annual Wellness questionnaire and have noted the following in the patient's chart:  A. Medical and social history B. Use of alcohol, tobacco or illicit drugs  C. Current medications and supplements D. Functional ability and status E.  Nutritional status F.  Physical activity G. Advance directives H. List of other physicians I.  Hospitalizations, surgeries, and ER visits in previous 12 months J.   Vitals K. Screenings to include hearing, vision, cognitive, depression L. Referrals and appointments - none  In addition, I have reviewed and discussed with patient certain preventive protocols, quality metrics, and best practice recommendations. A written personalized care plan for preventive services as well as general preventive health recommendations were provided to patient.  See attached scanned questionnaire for additional information.   Signed,   Nilda Calamity, LPN Health Advisor   I have reviewed the information entered by the Health Advisor. I was present in the office during the time of patient interaction and was available for consultation. I agree with the documentation and advice.  Lenon Curt Chilton Si, MD

## 2016-04-20 NOTE — Progress Notes (Signed)
Quick Notes   Health Maintenance:  Due for Foot exam, TDAP, Zostavax. Pt ask about OOP expense for TDAP and Zostavax.    Abnormal Screen: MMSE done in Nov. 2017. Score of 25/30    Patient Concerns:  Pt states she recently received Metformin and she has never taken it before and would like to know why she needs it.     Nurse Concerns:  None  I have reviewed the information entered by the Health Advisor. I was present in the office during the time of patient interaction and was available for consultation. I agree with the documentation and advice.  Lenon CurtArthur G. Chilton SiGreen, MD

## 2016-04-21 LAB — LIPID PANEL
Cholesterol: 148 mg/dL (ref <200)
HDL: 42 mg/dL — AB (ref >50)
LDL CALC: 71 mg/dL (ref <100)
Total CHOL/HDL Ratio: 3.5 Ratio (ref <5.0)
Triglycerides: 173 mg/dL — ABNORMAL HIGH (ref <150)
VLDL: 35 mg/dL — ABNORMAL HIGH (ref <30)

## 2016-04-21 LAB — COMPREHENSIVE METABOLIC PANEL
ALT: 12 U/L (ref 6–29)
AST: 15 U/L (ref 10–35)
Albumin: 3.6 g/dL (ref 3.6–5.1)
Alkaline Phosphatase: 67 U/L (ref 33–130)
BUN: 37 mg/dL — AB (ref 7–25)
CO2: 20 mmol/L (ref 20–31)
CREATININE: 1.77 mg/dL — AB (ref 0.60–0.88)
Calcium: 8.6 mg/dL (ref 8.6–10.4)
Chloride: 113 mmol/L — ABNORMAL HIGH (ref 98–110)
Glucose, Bld: 152 mg/dL — ABNORMAL HIGH (ref 65–99)
Potassium: 5.2 mmol/L (ref 3.5–5.3)
SODIUM: 142 mmol/L (ref 135–146)
TOTAL PROTEIN: 5.8 g/dL — AB (ref 6.1–8.1)
Total Bilirubin: 0.4 mg/dL (ref 0.2–1.2)

## 2016-04-27 ENCOUNTER — Other Ambulatory Visit: Payer: Medicare Other

## 2016-04-27 DIAGNOSIS — D649 Anemia, unspecified: Secondary | ICD-10-CM

## 2016-04-27 LAB — CBC WITH DIFFERENTIAL/PLATELET
BASOS ABS: 0 {cells}/uL (ref 0–200)
Basophils Relative: 0 %
EOS ABS: 128 {cells}/uL (ref 15–500)
Eosinophils Relative: 4 %
HCT: 33.6 % — ABNORMAL LOW (ref 35.0–45.0)
HEMOGLOBIN: 10.6 g/dL — AB (ref 11.7–15.5)
LYMPHS ABS: 1696 {cells}/uL (ref 850–3900)
Lymphocytes Relative: 53 %
MCH: 29.9 pg (ref 27.0–33.0)
MCHC: 31.5 g/dL — ABNORMAL LOW (ref 32.0–36.0)
MCV: 94.6 fL (ref 80.0–100.0)
MPV: 11.1 fL (ref 7.5–12.5)
Monocytes Absolute: 224 cells/uL (ref 200–950)
Monocytes Relative: 7 %
NEUTROS ABS: 1152 {cells}/uL — AB (ref 1500–7800)
Neutrophils Relative %: 36 %
PLATELETS: 149 10*3/uL (ref 140–400)
RBC: 3.55 MIL/uL — ABNORMAL LOW (ref 3.80–5.10)
RDW: 13.8 % (ref 11.0–15.0)
WBC: 3.2 10*3/uL — ABNORMAL LOW (ref 3.8–10.8)

## 2016-04-29 ENCOUNTER — Encounter (HOSPITAL_COMMUNITY): Payer: Self-pay | Admitting: Emergency Medicine

## 2016-04-29 ENCOUNTER — Emergency Department (HOSPITAL_COMMUNITY)
Admission: EM | Admit: 2016-04-29 | Discharge: 2016-04-29 | Disposition: A | Discharge status: Home / Self Care | Payer: Medicare Other | Attending: Emergency Medicine | Admitting: Emergency Medicine

## 2016-04-29 ENCOUNTER — Emergency Department (HOSPITAL_COMMUNITY): Payer: Medicare Other

## 2016-04-29 DIAGNOSIS — I2510 Atherosclerotic heart disease of native coronary artery without angina pectoris: Secondary | ICD-10-CM | POA: Insufficient documentation

## 2016-04-29 DIAGNOSIS — I5030 Unspecified diastolic (congestive) heart failure: Secondary | ICD-10-CM | POA: Diagnosis not present

## 2016-04-29 DIAGNOSIS — Z8673 Personal history of transient ischemic attack (TIA), and cerebral infarction without residual deficits: Secondary | ICD-10-CM | POA: Insufficient documentation

## 2016-04-29 DIAGNOSIS — I252 Old myocardial infarction: Secondary | ICD-10-CM | POA: Insufficient documentation

## 2016-04-29 DIAGNOSIS — Z87891 Personal history of nicotine dependence: Secondary | ICD-10-CM | POA: Insufficient documentation

## 2016-04-29 DIAGNOSIS — Z7984 Long term (current) use of oral hypoglycemic drugs: Secondary | ICD-10-CM | POA: Insufficient documentation

## 2016-04-29 DIAGNOSIS — I130 Hypertensive heart and chronic kidney disease with heart failure and stage 1 through stage 4 chronic kidney disease, or unspecified chronic kidney disease: Secondary | ICD-10-CM | POA: Insufficient documentation

## 2016-04-29 DIAGNOSIS — K623 Rectal prolapse: Secondary | ICD-10-CM | POA: Insufficient documentation

## 2016-04-29 DIAGNOSIS — N183 Chronic kidney disease, stage 3 (moderate): Secondary | ICD-10-CM | POA: Insufficient documentation

## 2016-04-29 DIAGNOSIS — E1121 Type 2 diabetes mellitus with diabetic nephropathy: Secondary | ICD-10-CM | POA: Insufficient documentation

## 2016-04-29 DIAGNOSIS — K6289 Other specified diseases of anus and rectum: Secondary | ICD-10-CM | POA: Diagnosis present

## 2016-04-29 LAB — COMPREHENSIVE METABOLIC PANEL
ALBUMIN: 3.8 g/dL (ref 3.5–5.0)
ALK PHOS: 65 U/L (ref 38–126)
ALT: 13 U/L — AB (ref 14–54)
ANION GAP: 6 (ref 5–15)
AST: 21 U/L (ref 15–41)
BUN: 38 mg/dL — ABNORMAL HIGH (ref 6–20)
CALCIUM: 8.5 mg/dL — AB (ref 8.9–10.3)
CO2: 23 mmol/L (ref 22–32)
CREATININE: 1.77 mg/dL — AB (ref 0.44–1.00)
Chloride: 115 mmol/L — ABNORMAL HIGH (ref 101–111)
GFR calc Af Amer: 29 mL/min — ABNORMAL LOW (ref >60)
GFR calc non Af Amer: 25 mL/min — ABNORMAL LOW (ref >60)
GLUCOSE: 177 mg/dL — AB (ref 65–99)
Potassium: 5.1 mmol/L (ref 3.5–5.1)
Sodium: 144 mmol/L (ref 135–145)
Total Bilirubin: 0.4 mg/dL (ref 0.3–1.2)
Total Protein: 6.4 g/dL — ABNORMAL LOW (ref 6.5–8.1)

## 2016-04-29 LAB — CBC WITH DIFFERENTIAL/PLATELET
BASOS ABS: 0 10*3/uL (ref 0.0–0.1)
BASOS PCT: 0 %
EOS ABS: 0.1 10*3/uL (ref 0.0–0.7)
EOS PCT: 2 %
HCT: 35.4 % — ABNORMAL LOW (ref 36.0–46.0)
Hemoglobin: 11.4 g/dL — ABNORMAL LOW (ref 12.0–15.0)
Lymphocytes Relative: 36 %
Lymphs Abs: 1.8 10*3/uL (ref 0.7–4.0)
MCH: 30.2 pg (ref 26.0–34.0)
MCHC: 32.2 g/dL (ref 30.0–36.0)
MCV: 93.9 fL (ref 78.0–100.0)
MONO ABS: 0.3 10*3/uL (ref 0.1–1.0)
MONOS PCT: 6 %
NEUTROS ABS: 2.9 10*3/uL (ref 1.7–7.7)
Neutrophils Relative %: 56 %
PLATELETS: 165 10*3/uL (ref 150–400)
RBC: 3.77 MIL/uL — ABNORMAL LOW (ref 3.87–5.11)
RDW: 14.3 % (ref 11.5–15.5)
WBC: 5.1 10*3/uL (ref 4.0–10.5)

## 2016-04-29 LAB — LIPASE, BLOOD: Lipase: 33 U/L (ref 11–51)

## 2016-04-29 MED ORDER — LIDOCAINE 5 % EX OINT: 1.0000 "application " | TOPICAL_OINTMENT | Freq: Two times a day (BID) | CUTANEOUS | 0 refills | Status: DC | PRN

## 2016-04-29 MED ORDER — DOCUSATE SODIUM 100 MG PO CAPS: 100.0000 mg | ORAL_CAPSULE | Freq: Two times a day (BID) | ORAL | 0 refills | Status: DC | PRN

## 2016-04-29 NOTE — ED Notes (Signed)
2 unsuccessful attempts at blood draw. Another RN to attempt.

## 2016-04-29 NOTE — ED Notes (Signed)
Bed: WA21 Expected date:  Expected time:  Means of arrival:  Comments: 

## 2016-04-29 NOTE — ED Provider Notes (Signed)
[facility]-EMERGENCY DEPT Provider Note   CSN: [id number] Arrival date & time: 04/29/16  1056     History   Chief Complaint Chief Complaint  Patient presents with  . Rectal Pain    HPI [name] is a 83 y.o. female.  HPI Patient has long history of rectal prolapse. States she's seen surgeon in the past but was not a good surgical candidate. States she had increasing pain yesterday evening. She states she felt the rectum prolapse. She's had some blood in her stool. Also endorses straining for bowel movements. Denies any fever or chills. She has had some lower abdominal pain and states she needs to urinate. Denies dysuria, frequency or urgency. No nausea or vomiting. Past Medical History:  Diagnosis Date  . Anemia   . Carotid artery occlusion   . Carotid bruit   . Chronic kidney disease (CKD), stage III (moderate)   . Coronary artery disease    Remote PCI. Had BMS to RCA and DES stent to Diagonal in Jan. 2012   . Diastolic heart failure    EF 55 to 60% per cath in Jan 2012  . DVT (deep venous thrombosis) (HCC)   . Gout   . Hyperlipidemia   . Hypertension   . Ischemic cardiomyopathy    with prior EF of 35%  . NSTEMI (non-ST elevated myocardial infarction) [facility](HCC) Jan 2012   with PCI to RCA and DX per Dr. Excell Seltzerooper  . Obesity   . Pneumonia 2016  . Proteinuria   . Stroke [facility](HCC) 2013   ?  mini    . TIA (transient ischemic attack)   . Type II diabetes mellitus (HCC)    long standing    Patient Active Problem List   Diagnosis Date Noted  . Pain in joint, lower leg 01/20/2016  . Ptosis 11/04/2015  . Glaucoma 08/25/2015  . Dementia 06/10/2015  . Expressive aphasia 05/26/2015  . Anal prolapse 02/25/2015  . Chronic kidney disease, stage III (moderate) 02/10/2014  . PAD (peripheral artery disease) (HCC) 01/17/2014  . Constipation 07/31/2013  . Atherosclerosis of native arteries of the extremities with intermittent claudication-Right Leg 07/04/2013  . Anxiety disorder 02/21/2013   . Diabetes mellitus with nephropathy (HCC) 07/05/2012  . Occlusion and stenosis of carotid artery without mention of cerebral infarction 06/21/2012  . Coronary artery disease   . Chronic diastolic heart failure, NYHA class 1 (HCC)   . Hyperlipidemia   . Hypertension   . Carotid bruit   . Gout   . Obesity   . TIA (transient ischemic attack)     Past Surgical History:  Procedure Laterality Date  . ABDOMINAL AORTAGRAM N/A 01/17/2014   Procedure: ABDOMINAL Ronny FlurryAORTAGRAM;  Surgeon: Sherren Kernsharles E Fields, MD;  [facility];  Service: Cardiovascular;  Laterality: N/A;  . ANKLE FUSION Bilateral   . APPENDECTOMY    . CATARACT EXTRACTION, BILATERAL Bilateral   . CORONARY ANGIOPLASTY WITH STENT PLACEMENT  04/05/2010   distal RCA  . FRACTURE SURGERY Right 2005   bilateral ankles   . TONSILLECTOMY      OB History    No data available       Home Medications    Prior to Admission medications   Medication Sig Start Date End Date Taking? Authorizing Provider  BETIMOL 0.5 % ophthalmic solution INSTILL 1 DROP INTO THE LEFT EYE DAILY 04/19/16  Yes Kimber RelicArthur G Green, MD  cloNIDine (CATAPRES) 0.1 MG tablet Take 1 tablet (0.1 mg total) by mouth daily. For high  blood pressure 09/04/14  Yes Sharon Seller, NP  furosemide (LASIX) 20 MG tablet One daily to treat edema Patient taking differently: Take 20 mg by mouth daily as needed. One daily to treat edema 03/08/16  Yes Kimber Relic, MD  losartan (COZAAR) 50 MG tablet TAKE 1 TABLET BY MOUTH EVERY DAY FOR HIGH BLOOD PRESSURE 02/24/16  Yes Kimber Relic, MD  LOTEMAX 0.5 % GEL PLACE 1 DROP IN THE LEFT EYE TWICE DAILY. 02/24/16  Yes Kimber Relic, MD  meloxicam (MOBIC) 15 MG tablet Take one tablet by mouth daily for pain in foot and ankle Patient taking differently: Take 15 mg by mouth daily as needed. Take one tablet by mouth daily for pain in foot and ankle 03/16/16  Yes Kimber Relic, MD  metFORMIN (GLUCOPHAGE) 500 MG tablet One twice daily to  control glucose 03/08/16  Yes Kimber Relic, MD  nitroGLYCERIN (NITROSTAT) 0.4 MG SL tablet Place one under tongue for chest pain, up to 3 tablets 10/22/15  Yes Historical Provider, MD  polyethylene glycol powder (GLYCOLAX/MIRALAX) powder DISSOLVE 17 GRAMS (1 CAPFUL) INTO 8 OUNCES OF LIQUID AND DRINK EVERY DAY AS NEEDED FOR BOWELS 05/18/15  Yes Kimber Relic, MD  docusate sodium (COLACE) 100 MG capsule Take 1 capsule (100 mg total) by mouth 2 (two) times daily as needed for mild constipation. 04/29/16   Loren Racer, MD  ezetimibe-simvastatin (VYTORIN) 10-20 MG tablet 1/2 tablet nightly to control cholesterol Patient not taking: Reported on 04/29/2016 05/26/15   Kimber Relic, MD  lidocaine (XYLOCAINE) 5 % ointment Apply 1 application topically 2 (two) times daily as needed. 04/29/16   Loren Racer, MD    Family History No family history on file.  Social History Social History  Substance Use Topics  . Smoking status: Former Games developer  . Smokeless tobacco: Never Used     Comment: "smoked in my teens; quit after 1 year or so"  . Alcohol use No     Allergies   Namzaric [memantine hcl-donepezil hcl]; Percocet [oxycodone-acetaminophen]; Sulfa antibiotics; Adhesive [tape]; Metformin and related; Tradjenta [linagliptin]; and Penicillins   Review of Systems Review of Systems  Constitutional: Negative for chills and fever.  Gastrointestinal: Positive for abdominal pain, anal bleeding, constipation and rectal pain. Negative for abdominal distention, diarrhea, nausea and vomiting.  Genitourinary: Negative for difficulty urinating, dysuria, flank pain, frequency and hematuria.  Musculoskeletal: Negative for back pain, myalgias, neck pain and neck stiffness.  Skin: Negative for rash and wound.  Neurological: Negative for dizziness, weakness and numbness.  All other systems reviewed and are negative.    Physical Exam Updated Vital Signs BP 193/57 (BP Location: Right Arm)   Pulse 70   Temp 98.2  F (36.8 C) (Oral)   Resp 18   SpO2 96%   Physical Exam  Constitutional: She is oriented to person, place, and time. She appears well-developed and well-nourished. No distress.  HENT:  Head: Normocephalic and atraumatic.  Mouth/Throat: Oropharynx is clear and moist.  Eyes: EOM are normal. Pupils are equal, round, and reactive to light.  Neck: Normal range of motion. Neck supple.  Cardiovascular: Normal rate and regular rhythm.   Pulmonary/Chest: Effort normal and breath sounds normal.  Abdominal: Soft. Bowel sounds are normal. There is tenderness. There is no rebound and no guarding.  Mild suprapubic tenderness to palpation. No rebound or guarding.  Genitourinary:  Genitourinary Comments: No visualized rectal prolapse. Small external hemorrhoids present. No fissures, masses or gross blood on rectal  exam.  Musculoskeletal: Normal range of motion. She exhibits no edema or tenderness.  No lower extremity swelling or asymmetry. No flank pain  Neurological: She is alert and oriented to person, place, and time.  Skin: Skin is warm and dry. Capillary refill takes less than 2 seconds. No rash noted. No erythema.  Psychiatric: She has a normal mood and affect. Her behavior is normal.  Nursing note and vitals reviewed.    ED Treatments / Results  Labs (all labs ordered are listed, but only abnormal results are displayed) Labs Reviewed  CBC WITH DIFFERENTIAL/PLATELET - Abnormal; Notable for the following:       Result Value   RBC 3.77 (*)    Hemoglobin 11.4 (*)    HCT 35.4 (*)    All other components within normal limits  COMPREHENSIVE METABOLIC PANEL - Abnormal; Notable for the following:    Chloride 115 (*)    Glucose, Bld 177 (*)    BUN 38 (*)    Creatinine, Ser 1.77 (*)    Calcium 8.5 (*)    Total Protein 6.4 (*)    ALT 13 (*)    GFR calc non Af Amer 25 (*)    GFR calc Af Amer 29 (*)    All other components within normal limits  LIPASE, BLOOD  URINALYSIS, ROUTINE W REFLEX  MICROSCOPIC    EKG  EKG Interpretation None       Radiology Dg Abd Acute W/chest  Result Date: 04/29/2016 CLINICAL DATA:  Abdominal pain, rectal pain EXAM: DG ABDOMEN ACUTE W/ 1V CHEST COMPARISON:  None. FINDINGS: There is no evidence of dilated bowel loops or free intraperitoneal air. No radiopaque calculi or other significant radiographic abnormality is seen. Heart size and mediastinal contours are within normal limits. No focal consolidation, pleural effusion or pneumothorax. Linear band of airspace disease in the left mid lung likely reflecting atelectasis. IMPRESSION: Negative abdominal radiographs.  No acute cardiopulmonary disease. Electronically Signed   By: Elige Ko   On: 04/29/2016 13:50    Procedures Procedures (including critical care time)  Medications Ordered in ED Medications - No data to display   Initial Impression / Assessment and Plan / ED Course  I have reviewed the triage vital signs and the nursing notes.  Pertinent labs & imaging results that were available during my care of the patient were reviewed by me and considered in my medical decision making (see chart for details).     Patient's repeat abdominal exam is benign. She is asking to be discharged home. She does not want to wait on UA. States she's had some loose stool lately. She does have some mild irritation in the perirectal area. Will prescribe topical anesthetic. Advised to follow-up with general surgery regarding episodic rectal prolapse. Return precautions been given.  Final Clinical Impressions(s) / ED Diagnoses   Final diagnoses:  Rectal prolapse    New Prescriptions New Prescriptions   LIDOCAINE (XYLOCAINE) 5 % OINTMENT    Apply 1 application topically 2 (two) times daily as needed.     Loren Racer, MD 04/29/16 [phone]

## 2016-04-29 NOTE — ED Triage Notes (Addendum)
Per EMS-states constipation for the past couple of days-states rectal pain as well-states she has had prolapse rectum in past-states this does not feel like the same kind of pain-CBG 288

## 2016-05-04 ENCOUNTER — Ambulatory Visit (INDEPENDENT_AMBULATORY_CARE_PROVIDER_SITE_OTHER): Payer: Medicare Other | Admitting: Internal Medicine

## 2016-05-04 ENCOUNTER — Encounter: Payer: Self-pay | Admitting: Internal Medicine

## 2016-05-04 VITALS — BP 92/60 | HR 64 | Resp 10 | Ht 60.0 in | Wt 142.0 lb

## 2016-05-04 DIAGNOSIS — K622 Anal prolapse: Secondary | ICD-10-CM

## 2016-05-04 DIAGNOSIS — K649 Unspecified hemorrhoids: Secondary | ICD-10-CM | POA: Diagnosis not present

## 2016-05-04 MED ORDER — HYDROCORTISONE 2.5 % RE CREA: TOPICAL_CREAM | RECTAL | 3 refills | Status: DC

## 2016-05-04 NOTE — Progress Notes (Signed)
Facility  [facility]    Place of Service:   OFFICE    Allergies  Allergen Reactions  . Namzaric [Memantine Hcl-Donepezil Hcl] Nausea Only    confusion  . Percocet [Oxycodone-Acetaminophen] Other (See Comments)    loosy goosy  . Sulfa Antibiotics Other (See Comments)    Tongue swells  . Adhesive [Tape] Other (See Comments)    Tears skin.  Please use "paper" tape  . Metformin And Related Other (See Comments)    Abnormal Kidney Functions   . Tradjenta [Linagliptin]     Low back ache, resolved once medication stopped   . Penicillins Hives    Tolerates Ceftriaxone, Has patient had a PCN reaction causing immediate rash, facial/tongue/throat swelling, SOB or lightheadedness with hypotension: Unknown Has patient had a PCN reaction causing severe rash involving mucus membranes or skin necrosis: No Has patient had a PCN reaction that required hospitalization No Has patient had a PCN reaction occurring within the last 10 years: No If all of the above answers are "NO", then may proceed with Cephalosporin use.     Chief Complaint  Patient presents with  . Medical Management of Chronic Issues    3 month follow-up, discuss labs (copy printed), patient is requesting consult for surgery  . Medication Refill    Refill [facility]   . Health Maintenance    Refused all updates at this time due to medical conditions, patient would like to wait until next appointment     HPI:  Went to the ER 04/29/16 and 03/09/16 due to rectal pain. Had hemorrhoids, but no visualized rectal prolapse. Had surgical referral through the ER. Surgical referral made.  Last seen by me 04/20/16. Was supposed to get surgical referral completed, but she says no one called her.  Anemia noted in ER with Hgb 11.4.  Medications: Patient's Medications  New Prescriptions   No medications on file  Previous Medications   BETIMOL 0.5 % OPHTHALMIC SOLUTION    INSTILL 1 DROP INTO THE LEFT EYE DAILY   CLONIDINE (CATAPRES) 0.1 MG TABLET    Take 1 tablet (0.1 mg total) by mouth daily. For high blood pressure   DOCUSATE SODIUM (COLACE) 100 MG CAPSULE    Take 1 capsule (100 mg total) by mouth 2 (two) times daily as needed for mild constipation.   EZETIMIBE-SIMVASTATIN (VYTORIN) 10-20 MG TABLET    1/2 tablet nightly to control cholesterol   FUROSEMIDE (LASIX) 20 MG TABLET    Take 20 mg by mouth as needed.   LOSARTAN (COZAAR) 50 MG TABLET    TAKE 1 TABLET BY MOUTH EVERY DAY FOR HIGH BLOOD PRESSURE   LOTEMAX 0.5 % GEL    PLACE 1 DROP IN THE LEFT EYE TWICE DAILY.   MELOXICAM (MOBIC) 15 MG TABLET    Take 15 mg by mouth as needed for pain. For foot and ankle pain   METFORMIN (GLUCOPHAGE) 500 MG TABLET    One twice daily to control glucose   NITROGLYCERIN (NITROSTAT) 0.4 MG SL TABLET    Place one under tongue for chest pain, up to 3 tablets   POLYETHYLENE GLYCOL POWDER (GLYCOLAX/MIRALAX) POWDER    DISSOLVE 17 GRAMS (1 CAPFUL) INTO 8 OUNCES OF LIQUID AND DRINK EVERY DAY AS NEEDED FOR BOWELS  Modified Medications   No medications on file  Discontinued Medications   FUROSEMIDE (LASIX) 20 MG TABLET    One daily to treat edema   LIDOCAINE (XYLOCAINE) 5 % OINTMENT  Apply 1 application topically 2 (two) times daily as needed.   MELOXICAM (MOBIC) 15 MG TABLET    Take one tablet by mouth daily for pain in foot and ankle    Review of Systems  Constitutional: Negative for activity change, appetite change, chills, diaphoresis, fatigue, fever and unexpected weight change.  HENT: Positive for sore throat (Acute issue). Negative for congestion, ear discharge, ear pain, hearing loss, postnasal drip, rhinorrhea, tinnitus, trouble swallowing and voice change.   Eyes: Positive for visual disturbance (corrective lenses). Negative for pain, redness and itching.       History of glaucoma and ptosis of the left eye.a  Respiratory: Negative for cough, choking, shortness of breath and wheezing.   Cardiovascular: Positive  for leg swelling. Negative for chest pain and palpitations.       History of chronic diastolic heart failure  Gastrointestinal: Positive for abdominal pain and constipation. Negative for abdominal distention, anal bleeding, diarrhea, nausea and rectal pain.       Rectal prolapse  Endocrine: Negative for cold intolerance, heat intolerance, polydipsia, polyphagia and polyuria.  Genitourinary: Negative for difficulty urinating, dysuria, flank pain, frequency, hematuria, pelvic pain, urgency and vaginal discharge.       Indwelling Foley catheter. Mass at rectum following straining CKD 3  Musculoskeletal: Positive for gait problem. Negative for arthralgias, back pain, myalgias, neck pain and neck stiffness.  Skin: Negative for color change, pallor, rash and wound.  Allergic/Immunologic: Negative.   Neurological: Negative for dizziness, tremors, seizures, syncope, weakness, light-headedness, numbness and headaches.       Mild-moderate dementia  Hematological: Negative for adenopathy. Does not bruise/bleed easily.       Chronic Anemia  Psychiatric/Behavioral: Positive for confusion. Negative for agitation, behavioral problems, dysphoric mood, hallucinations, sleep disturbance and suicidal ideas. The patient is not nervous/anxious and is not hyperactive.     Vitals:   05/04/16 1040  BP: 92/60  Pulse: 64  Resp: 10  SpO2: 95%  Weight: 142 lb (64.4 kg)  Height: 5' (1.524 m)   Body mass index is 27.73 kg/m. Wt Readings from Last 3 Encounters:  05/04/16 142 lb (64.4 kg)  04/20/16 142 lb (64.4 kg)  04/20/16 142 lb 6.4 oz (64.6 kg)      Physical Exam  Constitutional: She is oriented to person, place, and time. She appears well-developed and well-nourished. No distress.  Frail elderly female, NAD  HENT:  Head: Normocephalic and atraumatic.  Right Ear: External ear normal.  Left Ear: External ear normal.  Nose: Nose normal.  Mouth/Throat: Oropharynx is clear and moist. No oropharyngeal  exudate.  Eyes: Conjunctivae and EOM are normal. Pupils are equal, round, and reactive to light. No scleral icterus.  Glasses; difficulty reading list of medications  Neck: Normal range of motion. Neck supple. No JVD present. No tracheal deviation present. No thyromegaly present.  Cardiovascular: Normal rate, regular rhythm and intact distal pulses.  Exam reveals no gallop and no friction rub.   Murmur heard. 2+ pitting edema present bilateral lower legs  Pulmonary/Chest: Effort normal and breath sounds normal. No respiratory distress. She has no wheezes. She has no rales. She exhibits no tenderness.  Abdominal: Soft. Bowel sounds are normal. She exhibits no distension and no mass. There is no tenderness.  Genitourinary:  Genitourinary Comments: Loose anal sphincter. No palpable rectal mass. With straining, there is some mucosal eversion. Removed Foley catheter during office visit 06/24/15.  Musculoskeletal: Normal range of motion. She exhibits no tenderness. Edema: 2+ bipedal.  Lymphadenopathy:  She has no cervical adenopathy.  Neurological: She is alert and oriented to person, place, and time. No cranial nerve deficit. Coordination normal.  Slow unstable gait 06/10/15 MMSE 20/30. Failed clock drawing. 01/20/16 MMSE 25/30. Failed clock drawing.  Skin: Skin is warm and dry. No rash noted. She is not diaphoretic. No erythema. There is pallor.  Psychiatric: She has a normal mood and affect. Her behavior is normal. Judgment and thought content normal.    Labs reviewed: Lab Summary Latest Ref Rng & Units 04/29/2016 04/27/2016 04/20/2016  Hemoglobin 12.0 - 15.0 g/dL 11.4(L) 10.6(L) (None)  Hematocrit 36.0 - 46.0 % 35.4(L) 33.6(L) (None)  White count 4.0 - 10.5 K/uL 5.1 3.2(L) (None)  Platelet count 150 - 400 K/uL 165 149 (None)  Sodium 135 - 145 mmol/L 144 (None) 142  Potassium 3.5 - 5.1 mmol/L 5.1 (None) 5.2  Calcium 8.9 - 10.3 mg/dL 8.5(L) (None) 8.6  Phosphorus - (None) (None) (None)    Creatinine 0.44 - 1.00 mg/dL 1.77(H) (None) 1.77(H)  AST 15 - 41 U/L 21 (None) 15  Alk Phos 38 - 126 U/L 65 (None) 67  Bilirubin 0.3 - 1.2 mg/dL 0.4 (None) 0.4  Glucose 65 - 99 mg/dL 177(H) (None) 152(H)  Cholesterol <200 mg/dL (None) (None) 148  HDL cholesterol >50 mg/dL (None) (None) 42(L)  Triglycerides <150 mg/dL (None) (None) 173(H)  LDL Direct - (None) (None) (None)  LDL Calc <100 mg/dL (None) (None) 71  Total protein 6.5 - 8.1 g/dL 6.4(L) (None) 5.8(L)  Albumin 3.5 - 5.0 g/dL 3.8 (None) 3.6  Some recent data might be hidden   Lab Results  Component Value Date   TSH 2.149 06/12/2015   TSH 4.25 05/01/2014   TSH 5.43 (H) 04/02/2014   Lab Results  Component Value Date   BUN 38 (H) 04/29/2016   BUN 37 (H) 04/20/2016   BUN 31 (A) 03/04/2016   Lab Results  Component Value Date   HGBA1C 7.7 (H) 04/20/2016   HGBA1C 7.7 (H) 01/14/2016   HGBA1C 7.7 (H) 11/04/2015    Assessment/Plan  1. Anal prolapse - Ambulatory referral to General Surgery - hydrocortisone (ANUSOL-HC) 2.5 % rectal cream; Apply to rectum 3 times daily  Dispense: 28.8 g; Refill: 3  2. Hemorrhoids, unspecified hemorrhoid type - Ambulatory referral to General Surgery - hydrocortisone (ANUSOL-HC) 2.5 % rectal cream; Apply to rectum 3 times daily  Dispense: 28.8 g; Refill: 3

## 2016-05-05 ENCOUNTER — Ambulatory Visit: Payer: Self-pay | Admitting: Surgery

## 2016-05-10 ENCOUNTER — Telehealth: Payer: Self-pay | Admitting: Nurse Practitioner

## 2016-05-10 NOTE — Telephone Encounter (Signed)
Follow Up:    Tresa EndoKelly is calling to check on the status of the surgical clearance that was faxed on 05-06-16.

## 2016-05-11 ENCOUNTER — Ambulatory Visit (INDEPENDENT_AMBULATORY_CARE_PROVIDER_SITE_OTHER): Payer: Medicare Other | Admitting: Nurse Practitioner

## 2016-05-11 VITALS — BP 180/90 | HR 61 | Ht 60.0 in | Wt 140.1 lb

## 2016-05-11 DIAGNOSIS — I10 Essential (primary) hypertension: Secondary | ICD-10-CM

## 2016-05-11 DIAGNOSIS — I5032 Chronic diastolic (congestive) heart failure: Secondary | ICD-10-CM | POA: Diagnosis not present

## 2016-05-11 DIAGNOSIS — I259 Chronic ischemic heart disease, unspecified: Secondary | ICD-10-CM

## 2016-05-11 NOTE — Progress Notes (Signed)
CARDIOLOGY OFFICE NOTE  Date:  05/11/2016    [name] Date of Birth: 20-Mar-1928 Medical Record #[id number]  PCP:  Ashley Hodgkins, MD  Cardiologist:  [name]  Chief Complaint  Patient presents with  . Coronary Artery Disease    Follow up visit - seen for Dr. Excell Walters.     History of Present Illness: [name] is a 83 y.o. female who presents today for a follow up visit/pre op visit. Seen for Dr. Excell Walters. She is a former patient of Dr. Ronnald Walters.   She has multiple issues which include CAD, remote PCI, BMS to the RCA and DES to the DX in January of 2012. She has had longstanding diabetes, HTN, HLD, diastolic heart failure with EF 55 to 60% per cath back in 2012 but has been as low as 35%, PVD (followed by [name]), gout, CKD and chronic anemia. She has LBBB.   I have known [name] for almost 20 years - always seems to have an issue with her social situation, her medicines or her doctors in some shape or fashion.   Seen back in November of 2014 by PCP for some ongoing issues with anxiety/BP. Has had lisinopril added back along with some Xanax/Celexa. Having personal issues with a live in man that she does not want to evict as well as family issues in general. She has always cared for many animals in and at her home and had lots of issues with family.   I saw her back in January of 2015. Medicines had been adjusted for her BP. Still with lots of social issues with her home situation. I saw her in May of 2015- she had seen [name] for a foot ulcer - managed conservatively. She ultimately underwent bilateral common iliac stenting on January 17, 2014. 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 She did have a postprocedure hematoma in the groin that did resolve. She has been seen at the wound center for intermittent debridement of her right  medial malleolus ulcer. She was placed in an Radio broadcast assistant.   Seen in December of 2015 - off lots of her medicines - not clear as to why. BP was up. Clonidine and Norvasc were restarted. She was on a lower dose of ARB. On follow up by me in January - she was doing better. Cardiac status stable.   She came for a follow up in March of 2016- she was febrile - T of 102. Sent on to the ER - treated for UTI and possible pneumonia. I last saw her in February and she seemed to be doing ok. Had just had an admission for the flu.   Last seen by me back in August. She was declining in a general fashion. Still with lots of family issues that are difficult to follow and not clear if even true. She refuses to let her family come into the room with her. Her cardiac status seemed stable and she has been managed conservatively.   Comes in today. Here alone. Again, refuses to let her daughter in the room. Tells me that [name] apparently brought her here today - she will not let me talk to her - she is very adamant about this. During the visit, she tells me twice that it is [name] that is here with her today. She seems to continue to decline  in a general fashion. Wanting surgical clearance - she is not able to tell me what this is for or even who she is to see. She says she is "doing bad". Tells me she has a prolapse - her colon is falling out. She is incontinent of urine and stool apparently. Seems very limited by back pain. BP is up. Just took her medicines today before coming here. Very upset still today about her kids - feels like they don't care and they don't help. They don't help Notes her house is in such disarray - which she blames due to her daughter [name]. Refuses to let them go see the surgeon with her. No chest pain. Breathing is ok.   Past Medical History:  Diagnosis Date  . Anemia   . Carotid artery occlusion   . Carotid bruit   . Chronic kidney disease (CKD), stage III (moderate)   . Coronary artery disease     Remote PCI. Had BMS to RCA and DES stent to Diagonal in Jan. 2012   . Diastolic heart failure    EF 55 to 60% per cath in Jan 2012  . DVT (deep venous thrombosis) (HCC)   . Gout   . Hyperlipidemia   . Hypertension   . Ischemic cardiomyopathy    with prior EF of 35%  . NSTEMI (non-ST elevated myocardial infarction) [facility]) Jan 2012   with PCI to RCA and DX per Dr. Excell Walters  . Obesity   . Pneumonia 2016  . Proteinuria   . Stroke [facility]) 2013   ?  mini    . TIA (transient ischemic attack)   . Type II diabetes mellitus (HCC)    long standing    Past Surgical History:  Procedure Laterality Date  . ABDOMINAL AORTAGRAM N/A 01/17/2014   Procedure: ABDOMINAL Ronny Flurry;  Surgeon: Sherren Kerns, MD;  Location: [facility] CATH LAB;  Service: Cardiovascular;  Laterality: N/A;  . ANKLE FUSION Bilateral   . APPENDECTOMY    . CATARACT EXTRACTION, BILATERAL Bilateral   . CORONARY ANGIOPLASTY WITH STENT PLACEMENT  04/05/2010   distal RCA  . FRACTURE SURGERY Right 2005   bilateral ankles   . TONSILLECTOMY       Medications: Current Outpatient Prescriptions  Medication Sig Dispense Refill  . BETIMOL 0.5 % ophthalmic solution INSTILL 1 DROP INTO THE LEFT EYE DAILY 10 mL 1  . cloNIDine (CATAPRES) 0.1 MG tablet Take 1 tablet (0.1 mg total) by mouth daily. For high blood pressure 30 tablet 4  . docusate sodium (COLACE) 100 MG capsule Take 1 capsule (100 mg total) by mouth 2 (two) times daily as needed for mild constipation. 60 capsule 0  . ezetimibe-simvastatin (VYTORIN) 10-20 MG tablet 1/2 tablet nightly to control cholesterol 45 tablet 4  . furosemide (LASIX) 20 MG tablet Take 20 mg by mouth as needed.    Marland Kitchen losartan (COZAAR) 50 MG tablet TAKE 1 TABLET BY MOUTH EVERY DAY FOR HIGH BLOOD PRESSURE 90 tablet 1  . LOTEMAX 0.5 % GEL PLACE 1 DROP IN THE LEFT EYE TWICE DAILY. 5 g 1  . meloxicam (MOBIC) 15 MG tablet Take 15 mg by mouth as needed for pain. For foot and ankle pain    . metFORMIN (GLUCOPHAGE)  500 MG tablet One twice daily to control glucose 60 tablet 4  . nitroGLYCERIN (NITROSTAT) 0.4 MG SL tablet Place one under tongue for chest pain, up to 3 tablets    . polyethylene glycol powder (GLYCOLAX/MIRALAX) powder DISSOLVE 17 GRAMS (  1 CAPFUL) INTO 8 OUNCES OF LIQUID AND DRINK EVERY DAY AS NEEDED FOR BOWELS 850 g 5   No current facility-administered medications for this visit.     Allergies: Allergies  Allergen Reactions  . Namzaric [Memantine Hcl-Donepezil Hcl] Nausea Only    confusion  . Percocet [Oxycodone-Acetaminophen] Other (See Comments)    loosy goosy  . Sulfa Antibiotics Other (See Comments)    Tongue swells  . Adhesive [Tape] Other (See Comments)    Tears skin.  Please use "paper" tape  . Metformin And Related Other (See Comments)    Abnormal Kidney Functions   . Tradjenta [Linagliptin]     Low back ache, resolved once medication stopped   . Penicillins Hives    Tolerates Ceftriaxone, Has patient had a PCN reaction causing immediate rash, facial/tongue/throat swelling, SOB or lightheadedness with hypotension: Unknown Has patient had a PCN reaction causing severe rash involving mucus membranes or skin necrosis: No Has patient had a PCN reaction that required hospitalization No Has patient had a PCN reaction occurring within the last 10 years: No If all of the above answers are "NO", then may proceed with Cephalosporin use.     Social History: The patient  reports that she has quit smoking. She has never used smokeless tobacco. She reports that she does not drink alcohol or use drugs.   Family History: The patient's family history is not on file.   Review of Systems: Please see the history of present illness.   Otherwise, the review of systems is positive for none.   All other systems are reviewed and negative.   Physical Exam: VS:  BP (!) 180/90   Pulse 61   Ht 5' (1.524 m)   Wt 140 lb 1.9 oz (63.6 kg)   BMI 27.37 kg/m  .  BMI Body mass index is 27.37  kg/m.  Wt Readings from Last 3 Encounters:  05/11/16 140 lb 1.9 oz (63.6 kg)  05/04/16 142 lb (64.4 kg)  04/20/16 142 lb (64.4 kg)    BP is 170/60 by me.   General: Elderly female. Looks chronically ill but alert and in no acute distress. Moves very slow.  Easily agitated today. Confused as well.   HEENT: Normal.  Neck: Supple, no JVD, carotid bruits, or masses noted.  Cardiac: Regular rate and rhythm. No murmurs, rubs, or gallops. No edema.  Respiratory:  Lungs are clear to auscultation bilaterally with normal work of breathing.  GI: Soft and nontender.  MS: No deformity or atrophy. Gait and ROM intact.  Skin: Warm and dry. Color is normal.  Neuro:  Strength and sensation are intact and no gross focal deficits noted.  Psych: Alert, appropriate and with normal affect.   LABORATORY DATA:  EKG:  EKG is ordered today. This demonstrates NSR with LBBB.  Lab Results  Component Value Date   WBC 5.1 04/29/2016   HGB 11.4 (L) 04/29/2016   HCT 35.4 (L) 04/29/2016   PLT 165 04/29/2016   GLUCOSE 177 (H) 04/29/2016   CHOL 148 04/20/2016   TRIG 173 (H) 04/20/2016   HDL 42 (L) 04/20/2016   LDLCALC 71 04/20/2016   ALT 13 (L) 04/29/2016   AST 21 04/29/2016   NA 144 04/29/2016   K 5.1 04/29/2016   CL 115 (H) 04/29/2016   CREATININE 1.77 (H) 04/29/2016   BUN 38 (H) 04/29/2016   CO2 23 04/29/2016   TSH 2.149 06/12/2015   INR 1.09 04/15/2010   HGBA1C 7.7 (H) 04/20/2016  MICROALBUR 45.0 01/14/2016    BNP (last 3 results)  Recent Labs  07/02/15 0757  BNP 246.4*    ProBNP (last 3 results) No results for input(s): PROBNP in the last 8760 hours.   Other Studies Reviewed Today:   Assessment/Plan: 1. Pre op clearance - do not have any paperwork - unclear as to what she needs - she does not even know - I do not think she is even competent to make this decision. She would be considered high risk for any type of procedure in my opinion given her multiple comorbidities.   2.  HTN - BP high - unclear if she is even taking her medicines correctly.  3. CAD - no chest pain. Would favor conservative management.  4. HLD - on statin therapy  5. PVD - followed by [facility] with [name]   6. Hyperkalemia - off ARB  7. Diastolic HF - not on the best regimen. Breathing is ok.   8. Dementia - not really clear to me what to believe about her home situation.   I have gone to the lobby - looking to speak to [name] - but it was [name] who was actually with her. We have been able to talk. Marrion continues to decline mentally. She is more than likely not taking medicines correctly. She is refusing her family's care. She is sending checks to [employer]. They are basically at their wits end according to [name]. They are now looking to have her declared incompetent.   She is felt to be high risk for any procedure from our standpoint. Her overall prognosis to me is quite poor.    Current medicines are reviewed with the patient today.  The patient does not have concerns regarding medicines other than what has been noted above.  The following changes have been made:  See above.  Labs/ tests ordered today include:    Orders Placed This Encounter  Procedures  . EKG 12-Lead     Disposition:   FU with me in 6 months - we have given her the number back to General Surgery.   Patient is agreeable to this plan and will call if any problems develop in the interim.   SignedNorma Fredrickson, NP  05/11/2016 2:32 PM  [facility] [address] [place], [place]  [address] Phone: [phone] Fax: [phone]

## 2016-05-11 NOTE — Patient Instructions (Addendum)
We will be checking the following labs today - NONE   Medication Instructions:    Continue with your current medicines.     Testing/Procedures To Be Arranged:  N/A  Follow-Up:   See me in 6 months  You need to go back and see Dr. Michaell CowingGross - his phone number is [phone]   Other Special Instructions:   N/A    If you need a refill on your cardiac medications before your next appointment, please call your pharmacy.   Call the [facility] office at [phone] if you have any questions, problems or concerns.

## 2016-05-16 ENCOUNTER — Other Ambulatory Visit: Payer: Self-pay | Admitting: Internal Medicine

## 2016-05-16 DIAGNOSIS — H409 Unspecified glaucoma: Secondary | ICD-10-CM

## 2016-05-20 ENCOUNTER — Ambulatory Visit (INDEPENDENT_AMBULATORY_CARE_PROVIDER_SITE_OTHER): Payer: Medicare Other | Admitting: Ophthalmology

## 2016-05-27 ENCOUNTER — Ambulatory Visit: Payer: Medicare Other | Admitting: Podiatry

## 2016-05-27 ENCOUNTER — Ambulatory Visit (INDEPENDENT_AMBULATORY_CARE_PROVIDER_SITE_OTHER): Payer: Medicare Other | Admitting: Ophthalmology

## 2016-06-03 ENCOUNTER — Ambulatory Visit: Payer: Medicare Other | Admitting: Internal Medicine

## 2016-06-09 ENCOUNTER — Emergency Department (HOSPITAL_COMMUNITY)
Admission: EM | Admit: 2016-06-09 | Discharge: 2016-06-09 | Disposition: A | Discharge status: Home / Self Care | Payer: Medicare Other | Attending: Emergency Medicine | Admitting: Emergency Medicine

## 2016-06-09 ENCOUNTER — Emergency Department (HOSPITAL_COMMUNITY): Payer: Medicare Other

## 2016-06-09 ENCOUNTER — Encounter (HOSPITAL_COMMUNITY): Payer: Self-pay

## 2016-06-09 DIAGNOSIS — Z87891 Personal history of nicotine dependence: Secondary | ICD-10-CM | POA: Diagnosis not present

## 2016-06-09 DIAGNOSIS — N183 Chronic kidney disease, stage 3 (moderate): Secondary | ICD-10-CM | POA: Insufficient documentation

## 2016-06-09 DIAGNOSIS — K623 Rectal prolapse: Secondary | ICD-10-CM

## 2016-06-09 DIAGNOSIS — K6289 Other specified diseases of anus and rectum: Secondary | ICD-10-CM | POA: Diagnosis present

## 2016-06-09 DIAGNOSIS — Z7984 Long term (current) use of oral hypoglycemic drugs: Secondary | ICD-10-CM | POA: Insufficient documentation

## 2016-06-09 DIAGNOSIS — I252 Old myocardial infarction: Secondary | ICD-10-CM | POA: Diagnosis not present

## 2016-06-09 DIAGNOSIS — Z8673 Personal history of transient ischemic attack (TIA), and cerebral infarction without residual deficits: Secondary | ICD-10-CM | POA: Diagnosis not present

## 2016-06-09 DIAGNOSIS — E1122 Type 2 diabetes mellitus with diabetic chronic kidney disease: Secondary | ICD-10-CM | POA: Insufficient documentation

## 2016-06-09 DIAGNOSIS — Z79899 Other long term (current) drug therapy: Secondary | ICD-10-CM | POA: Diagnosis not present

## 2016-06-09 DIAGNOSIS — I130 Hypertensive heart and chronic kidney disease with heart failure and stage 1 through stage 4 chronic kidney disease, or unspecified chronic kidney disease: Secondary | ICD-10-CM | POA: Insufficient documentation

## 2016-06-09 DIAGNOSIS — I2510 Atherosclerotic heart disease of native coronary artery without angina pectoris: Secondary | ICD-10-CM | POA: Insufficient documentation

## 2016-06-09 DIAGNOSIS — I5032 Chronic diastolic (congestive) heart failure: Secondary | ICD-10-CM | POA: Insufficient documentation

## 2016-06-09 MED ORDER — DOCUSATE SODIUM 100 MG PO CAPS: 100.0000 mg | ORAL_CAPSULE | Freq: Two times a day (BID) | ORAL | 0 refills | Status: AC

## 2016-06-09 NOTE — ED Triage Notes (Signed)
Pt arrived via [facility] from home c/o rectal pain for 3-4 weeks. Pt states she was diagnosed with rectal prolapse and states she was told she wasn't a surgical candidate. Pt is on a liquid diet at home.  Pt hasn't taken her home meds today including her BP meds.  EMS gave Fentanyl.

## 2016-06-09 NOTE — ED Notes (Signed)
Pt waiting on MD to answer questions

## 2016-06-09 NOTE — ED Provider Notes (Signed)
[facility]-EMERGENCY DEPT Provider Note   CSN: [id number] Arrival date & time: 06/09/16  1606     History   Chief Complaint No chief complaint on file.   HPI [name] is a 83 y.o. female.  HPI  83 y/o female with history of heart failure, CAD status post PCI, HTN, HLD, and rectal prolapse who presents with pain associated with recurrent rectal prolapse. Patient states that she's been suffering from intermittent episodes of prolapse of her rectum for the last 2 months. States that she's been seen by surgery and her cardiologist, but that she is not a candidate for surgery secondary to her comorbidities. Reports that earlier in the day today she started to experience a terrible burning pain that she associates with episodes of prolapse. Called EMS, and on the way here "they gave me something through my veins that helped with the pain." States that she's been adhering to a liquid diet so that her bowel movements do not produce prolapse. States that she's been having small amounts of liquid stool, but has not had a formed stool in several weeks. Denies abdominal pain. States she is passing flatus. No nausea, vomiting, or fevers.  Past Medical History:  Diagnosis Date  . Anemia   . Carotid artery occlusion   . Carotid bruit   . Chronic kidney disease (CKD), stage III (moderate)   . Coronary artery disease    Remote PCI. Had BMS to RCA and DES stent to Diagonal in Jan. 2012   . Diastolic heart failure    EF 55 to 60% per cath in Jan 2012  . DVT (deep venous thrombosis) (HCC)   . Gout   . Hyperlipidemia   . Hypertension   . Ischemic cardiomyopathy    with prior EF of 35%  . NSTEMI (non-ST elevated myocardial infarction) [facility](HCC) Jan 2012   with PCI to RCA and DX per Dr. Excell Seltzerooper  . Obesity   . Pneumonia 2016  . Proteinuria   . Stroke [facility](HCC) 2013   ?  mini    . TIA (transient ischemic attack)   . Type II diabetes mellitus (HCC)    long standing    Patient Active Problem List   Diagnosis Date Noted  . Hemorrhoids 05/04/2016  . Pain in joint, lower leg 01/20/2016  . Ptosis 11/04/2015  . Glaucoma 08/25/2015  . Dementia 06/10/2015  . Expressive aphasia 05/26/2015  . Anal prolapse 02/25/2015  . Chronic kidney disease, stage III (moderate) 02/10/2014  . PAD (peripheral artery disease) (HCC) 01/17/2014  . Constipation 07/31/2013  . Atherosclerosis of native arteries of the extremities with intermittent claudication-Right Leg 07/04/2013  . Anxiety disorder 02/21/2013  . Diabetes mellitus with nephropathy (HCC) 07/05/2012  . Occlusion and stenosis of carotid artery without mention of cerebral infarction 06/21/2012  . Coronary artery disease   . Chronic diastolic heart failure, NYHA class 1 (HCC)   . Hyperlipidemia   . Hypertension   . Carotid bruit   . Gout   . Obesity   . TIA (transient ischemic attack)     Past Surgical History:  Procedure Laterality Date  . ABDOMINAL AORTAGRAM N/A 01/17/2014   Procedure: ABDOMINAL Ronny FlurryAORTAGRAM;  Surgeon: Sherren Kernsharles E Fields, MD;  [facility];  Service: Cardiovascular;  Laterality: N/A;  . ANKLE FUSION Bilateral   . APPENDECTOMY    . CATARACT EXTRACTION, BILATERAL Bilateral   . CORONARY ANGIOPLASTY WITH STENT PLACEMENT  04/05/2010   distal RCA  . FRACTURE SURGERY Right 2005  bilateral ankles   . TONSILLECTOMY      OB History    No data available       Home Medications    Prior to Admission medications   Medication Sig Start Date End Date Taking? Authorizing Provider  BETIMOL 0.5 % ophthalmic solution INSTILL 1 DROP INTO THE LEFT EYE DAILY 04/19/16   Kimber Relic, MD  cloNIDine (CATAPRES) 0.1 MG tablet Take 1 tablet (0.1 mg total) by mouth daily. For high blood pressure 09/04/14   Sharon Seller, NP  docusate sodium (COLACE) 100 MG capsule Take 1 capsule (100 mg total) by mouth 2 (two) times daily as needed for mild constipation. 04/29/16   Loren Racer, MD  docusate sodium (COLACE) 100 MG capsule Take  1 capsule (100 mg total) by mouth every 12 (twelve) hours. 06/09/16 07/09/16  Jenifer Ernestina Penna, MD  ezetimibe-simvastatin (VYTORIN) 10-20 MG tablet 1/2 tablet nightly to control cholesterol 05/26/15   Kimber Relic, MD  furosemide (LASIX) 20 MG tablet Take 20 mg by mouth as needed.    Historical Provider, MD  losartan (COZAAR) 50 MG tablet TAKE 1 TABLET BY MOUTH EVERY DAY FOR HIGH BLOOD PRESSURE 02/24/16   Kimber Relic, MD  LOTEMAX 0.5 % GEL PLACE 1 DROP IN THE LEFT EYE TWICE DAILY 05/17/16   Kimber Relic, MD  meloxicam (MOBIC) 15 MG tablet Take 15 mg by mouth as needed for pain. For foot and ankle pain    Historical Provider, MD  metFORMIN (GLUCOPHAGE) 500 MG tablet One twice daily to control glucose 03/08/16   Kimber Relic, MD  nitroGLYCERIN (NITROSTAT) 0.4 MG SL tablet Place one under tongue for chest pain, up to 3 tablets 10/22/15   Historical Provider, MD  polyethylene glycol powder (GLYCOLAX/MIRALAX) powder DISSOLVE 17 GRAMS (1 CAPFUL) INTO 8 OUNCES OF LIQUID AND DRINK EVERY DAY AS NEEDED FOR BOWELS 05/18/15   Kimber Relic, MD    Family History History reviewed. No pertinent family history.  Social History Social History  Substance Use Topics  . Smoking status: Former Games developer  . Smokeless tobacco: Never Used     Comment: "smoked in my teens; quit after 1 year or so"  . Alcohol use No     Allergies   Namzaric [memantine hcl-donepezil hcl]; Percocet [oxycodone-acetaminophen]; Sulfa antibiotics; Adhesive [tape]; Metformin and related; Tradjenta [linagliptin]; and Penicillins   Review of Systems Review of Systems  Constitutional: Negative for chills and fever.  HENT: Negative for congestion.   Eyes: Negative for visual disturbance.  Respiratory: Negative for cough and shortness of breath.   Cardiovascular: Negative for chest pain.  Gastrointestinal: Positive for diarrhea (small amounts) and rectal pain. Negative for abdominal pain, blood in stool, nausea and vomiting.    Genitourinary: Negative for dysuria and frequency.  Musculoskeletal: Negative for arthralgias, back pain and myalgias.  Skin: Negative for color change.  Neurological: Negative for dizziness, weakness, light-headedness, numbness and headaches.  Psychiatric/Behavioral: Negative for agitation.  All other systems reviewed and are negative.    Physical Exam Updated Vital Signs BP (!) 160/94 (BP Location: Right Arm)   Pulse 80   Temp 98.2 F (36.8 C) (Oral)   Resp 16   Ht 5' (1.524 m)   Wt 63 kg   SpO2 100%   BMI 27.15 kg/m   Physical Exam  Constitutional: She is oriented to person, place, and time. She appears well-developed and well-nourished. No distress.  HENT:  Head: Normocephalic and atraumatic.  Eyes: Conjunctivae  are normal.  Neck: Normal range of motion. Neck supple.  Cardiovascular: Normal rate, regular rhythm, normal heart sounds and intact distal pulses.   No murmur heard. Pulmonary/Chest: Effort normal and breath sounds normal. No respiratory distress. She has no wheezes. She has no rales.  Abdominal: Soft. Bowel sounds are normal. There is no tenderness. There is no guarding.  Genitourinary:  Genitourinary Comments: No evidence of rectal prolapse. Rectum normal, with no masses or blood. Scant liquid brown stool in the vault.   Musculoskeletal: She exhibits no edema or deformity.  Neurological: She is alert and oriented to person, place, and time. She exhibits normal muscle tone.  Skin: Skin is warm and dry. She is not diaphoretic.  Psychiatric: She has a normal mood and affect.  Nursing note and vitals reviewed.    ED Treatments / Results  Labs (all labs ordered are listed, but only abnormal results are displayed) Labs Reviewed - No data to display  EKG  EKG Interpretation  Date/Time:  Thursday June 09 2016 16:20:42 EDT Ventricular Rate:  67 PR Interval:  148 QRS Duration: 126 QT Interval:  432 QTC Calculation: 456 R Axis:   -51 Text  Interpretation:  Normal sinus rhythm Left axis deviation Non-specific intra-ventricular conduction block Cannot rule out Anterior infarct , age undetermined Abnormal ECG No significant change since last tracing Confirmed by ISAACS MD, Sheria Lang [phone]) on 06/09/2016 5:23:37 PM       Radiology Dg Abdomen 1 View  Result Date: 06/09/2016 CLINICAL DATA:  Rectal pain over the past month.  Constipation. EXAM: ABDOMEN - 1 VIEW COMPARISON:  04/29/2016 and 12/14/2015, CT 06/04/2014 FINDINGS: Bowel gas pattern is nonobstructive. Mild fecal retention over the right colon. No free peritoneal air. Remainder of the exam is unchanged. IMPRESSION: Nonobstructive bowel gas pattern. Electronically Signed   By: Elberta Fortis M.D.   On: 06/09/2016 17:28    Procedures Procedures (including critical care time)  Medications Ordered in ED Medications - No data to display   Initial Impression / Assessment and Plan / ED Course  I have reviewed the triage vital signs and the nursing notes.  Pertinent labs & imaging results that were available during my care of the patient were reviewed by me and considered in my medical decision making (see chart for details).     Generally well-appearing. Afebrile and hemodynamically stable. Abdominal exam benign, with no distention or tympany to suggest obstruction. X-ray of the abdomen performed with nonobstructive bowel gas pattern and mild fecal retention to the right colon. Patient is not currently on stool softeners; recommend starting colace.   On exam patient's rectum is not prolapsed, and rectal exam reveals normal rectum with no masses and liquid brown stool in the vault. Patient counseled that she can attempt to reduce her rectum herself when it prolapses with gentle pressure. Pt counseled to return to the ED for inability to reduce her prolapse. She was given the phone number for the surgeon on-call to obtain a second opinion as she is very distressed that "no one will  operate on me." She is stable for discharge home with PCP follow-up.   Care of pt overseen by my attending, Dr. Erma Heritage.   Final Clinical Impressions(s) / ED Diagnoses   Final diagnoses:  Rectal prolapse    New Prescriptions Discharge Medication List as of 06/09/2016  6:16 PM    START taking these medications   Details  !! docusate sodium (COLACE) 100 MG capsule Take 1 capsule (100 mg total) by  mouth every 12 (twelve) hours., Starting Thu 06/09/2016, Until Sat 07/09/2016, Print     !! - Potential duplicate medications found. Please discuss with provider.       Jenifer Ernestina Penna, MD 06/09/16 2310    Shaune Pollack, MD 06/10/16 [phone]

## 2016-06-09 NOTE — ED Notes (Signed)
Pt stable, ambulatory, states understanding of discharge instructions, daughter at bedside 

## 2016-06-14 ENCOUNTER — Ambulatory Visit (INDEPENDENT_AMBULATORY_CARE_PROVIDER_SITE_OTHER): Payer: Medicare Other | Admitting: Podiatry

## 2016-06-14 DIAGNOSIS — M79676 Pain in unspecified toe(s): Secondary | ICD-10-CM | POA: Diagnosis not present

## 2016-06-14 DIAGNOSIS — E0851 Diabetes mellitus due to underlying condition with diabetic peripheral angiopathy without gangrene: Secondary | ICD-10-CM

## 2016-06-14 DIAGNOSIS — B351 Tinea unguium: Secondary | ICD-10-CM

## 2016-06-14 NOTE — Progress Notes (Signed)
Complaint:  Visit Type: Patient returns to my office for continued preventative foot care services. Complaint: Patient states" my nails have grown long and thick and become painful to walk and wear shoes" Patient has been diagnosed with DM with no foot complications. The patient presents for preventative foot care services. No changes to ROS  Podiatric Exam: Vascular: dorsalis pedis and posterior tibial pulses are palpable bilateral. Capillary return is immediate. Temperature gradient is WNL. Skin turgor WNL  Sensorium: Normal Semmes Weinstein monofilament test. Normal tactile sensation bilaterally. Nail Exam: Pt has thick disfigured discolored nails with subungual debris noted bilateral entire nail hallux through fifth toenails Ulcer Exam: There is no evidence of ulcer or pre-ulcerative changes or infection. Orthopedic Exam: Muscle tone and strength are WNL. No limitations in general ROM. No crepitus or effusions noted. Foot type and digits show no abnormalities. Bony prominences are unremarkable. Skin: No Porokeratosis. No infection or ulcers  Diagnosis:  Onychomycosis, , Pain in right toe, pain in left toes  Treatment & Plan Procedures and Treatment: Consent by patient was obtained for treatment procedures. The patient understood the discussion of treatment and procedures well. All questions were answered thoroughly reviewed. Debridement of mycotic and hypertrophic toenails, 1 through 5 bilateral and clearing of subungual debris. No ulceration, no infection noted.  Return Visit-Office Procedure: Patient instructed to return to the office for a follow up visit 3 months for continued evaluation and treatment.    Vandy Tsuchiya DPM 

## 2016-06-15 ENCOUNTER — Telehealth: Payer: Self-pay | Admitting: *Deleted

## 2016-06-15 NOTE — Telephone Encounter (Signed)
Patient was seen in the ER and patient is telling daughter, Arna Mediciora, that the ER stopped all of her medications. Daughter is calling to see if that is true or if patient misunderstood.  I reviewed medication list and last ER note and it doesn't state for patient to stop her medications. They added Colace. Informed daughter and daughter agreed.

## 2016-06-16 ENCOUNTER — Telehealth: Payer: Self-pay

## 2016-06-16 DIAGNOSIS — F0390 Unspecified dementia without behavioral disturbance: Secondary | ICD-10-CM

## 2016-06-16 NOTE — Telephone Encounter (Signed)
Refer to home Health Care for social worker evaluation of home situation. I do think that her ability to care for herself independent;ly is compromised by memory deficits.

## 2016-06-16 NOTE — Telephone Encounter (Signed)
Ashley MoronJessica Walters from [facility], [facility]age phone [phone], ext [phone]. They have been going to patients house, she has 2 children living with her that doesn't support her, patient stays in one room all day. House is not clean. [facility], not able to go to patients house any more.  They are asking Dr. Chilton SiGreen to do a referral for Home Health Care to have social worker to do a evaluation. Adult Services may need to be called in. On 05/11/16 [name], NP cardiology office notes, she talked with daughter Arna Mediciora. Patient is refusing family's care, not taking medication correctly, sending checks to [employer]. They are at their wits end according to Arna Mediciora. They are looking to have her declared incompetent. Note forward to Dr. Chilton SiGreen.

## 2016-06-16 NOTE — Telephone Encounter (Signed)
Left a message on [name]'s voice mail that Dr. Chilton SiGreen will make [facility].

## 2016-06-20 DIAGNOSIS — I130 Hypertensive heart and chronic kidney disease with heart failure and stage 1 through stage 4 chronic kidney disease, or unspecified chronic kidney disease: Secondary | ICD-10-CM | POA: Diagnosis not present

## 2016-06-20 DIAGNOSIS — N183 Chronic kidney disease, stage 3 (moderate): Secondary | ICD-10-CM

## 2016-06-20 DIAGNOSIS — E1122 Type 2 diabetes mellitus with diabetic chronic kidney disease: Secondary | ICD-10-CM | POA: Diagnosis not present

## 2016-06-20 DIAGNOSIS — I2510 Atherosclerotic heart disease of native coronary artery without angina pectoris: Secondary | ICD-10-CM

## 2016-06-20 DIAGNOSIS — K649 Unspecified hemorrhoids: Secondary | ICD-10-CM

## 2016-06-20 DIAGNOSIS — I5032 Chronic diastolic (congestive) heart failure: Secondary | ICD-10-CM

## 2016-06-20 DIAGNOSIS — E785 Hyperlipidemia, unspecified: Secondary | ICD-10-CM | POA: Diagnosis not present

## 2016-06-20 DIAGNOSIS — F0390 Unspecified dementia without behavioral disturbance: Secondary | ICD-10-CM | POA: Diagnosis not present

## 2016-06-22 ENCOUNTER — Telehealth: Payer: Self-pay | Admitting: *Deleted

## 2016-06-22 NOTE — Telephone Encounter (Signed)
[name] with [facility] called requesting verbal orders forl 2x2wks,1x7wks, Home Health Aid, OT and PT for ADLs. Verbal order given. She will be faxing Order Fax for provider to sign.

## 2016-06-27 ENCOUNTER — Telehealth: Payer: Self-pay | Admitting: Cardiology

## 2016-06-27 NOTE — Telephone Encounter (Signed)
I've never seen pt - try Norma Fredrickson.  She has seen.

## 2016-06-27 NOTE — Telephone Encounter (Signed)
Lynda(Daughter) is wanting to speak with Vernona Rieger ( did not want to say the reason)

## 2016-06-27 NOTE — Telephone Encounter (Signed)
I called the emergency person  Ruthe Mannan   " she answer the phone and said "who is this"  "I  didn't want to talk with you "and gave the phone to a female person. I just let him know that  I was calling to let her know that Vernona Rieger was not in the office today.Marland Kitchen

## 2016-06-28 NOTE — Telephone Encounter (Signed)
I have called [name] - she handed the phone off to [name] denied having any concerns.   I have then called [name] - spoke with her husband. Asked for [name] to call me back if there were any concerns.

## 2016-07-06 ENCOUNTER — Ambulatory Visit (INDEPENDENT_AMBULATORY_CARE_PROVIDER_SITE_OTHER): Payer: Medicare Other | Admitting: Ophthalmology

## 2016-07-06 DIAGNOSIS — I10 Essential (primary) hypertension: Secondary | ICD-10-CM | POA: Diagnosis not present

## 2016-07-06 DIAGNOSIS — H35033 Hypertensive retinopathy, bilateral: Secondary | ICD-10-CM | POA: Diagnosis not present

## 2016-07-06 DIAGNOSIS — E11319 Type 2 diabetes mellitus with unspecified diabetic retinopathy without macular edema: Secondary | ICD-10-CM | POA: Diagnosis not present

## 2016-07-06 DIAGNOSIS — H43813 Vitreous degeneration, bilateral: Secondary | ICD-10-CM | POA: Diagnosis not present

## 2016-07-06 DIAGNOSIS — E113393 Type 2 diabetes mellitus with moderate nonproliferative diabetic retinopathy without macular edema, bilateral: Secondary | ICD-10-CM | POA: Diagnosis not present

## 2016-07-08 ENCOUNTER — Other Ambulatory Visit: Payer: Self-pay

## 2016-07-08 MED ORDER — MELOXICAM 15 MG PO TABS: 15.0000 mg | ORAL_TABLET | ORAL | 1 refills | Status: DC | PRN

## 2016-07-14 ENCOUNTER — Telehealth: Payer: Self-pay

## 2016-07-14 NOTE — Telephone Encounter (Signed)
Christy with [facility] called very concerned about patient and her current living conditions. Patient was receiving PT/OT and social service assistance through [facility].   [facility] will have to d/c services due to patient without running water, 9-10 cats, cat litter and urine all over house, toilet overflowing, and pills knocked all over floor by cats.   Patient informed [facility] that she was told her water is run by a well and will not be fixed for 2 weeks. Patient is not taking medications for they are all mixed up on the floor and she does not know what is what.   Patient has a room mate and a daughter that she restricted [facility] from speaking with.   [facility] also notices cognitive differences since initial visit on 06/20/16.   [facility] is working with Child psychotherapist to see how they can assist patient with water situation  Please advise

## 2016-07-14 NOTE — Telephone Encounter (Signed)
I called Adult Protective Services @ [phone]. Left message requesting return call.

## 2016-07-14 NOTE — Telephone Encounter (Signed)
Rosalio Macadamia with Adult Protective Services returned called and indicated this situation was also reported by the Home Health Agency [facility].   Gearldine Bienenstock states the social worker with the Home Health Agency should assist patient with water concerns and community resources. I will follow up with Yates Decamp ([facility])  I called Neysa Bonito back and the social worker tried to follow-up with patient and the patient will not return any calls

## 2016-07-14 NOTE — Telephone Encounter (Signed)
I think this situation will need to be reported to Adult Protective Serves of [employer] [facility]. She has a progressive dementia. She needs a guardian.

## 2016-07-15 NOTE — Telephone Encounter (Signed)
Spoke with Lehman Prom, a friend, Mrs Mahan is in bed a sleep. I asked him about the water. She has a well, the pump didn't shut off and ran out of water. The daughter has to get a permit from the city to deep another well. Told Mr. Brendia Sacks I would call later to talk with Mrs. Montez Morita.

## 2016-07-15 NOTE — Telephone Encounter (Signed)
I called patient, no answer, unable to leave a message, memory full

## 2016-07-27 ENCOUNTER — Ambulatory Visit (INDEPENDENT_AMBULATORY_CARE_PROVIDER_SITE_OTHER): Payer: Medicare Other | Admitting: Internal Medicine

## 2016-07-27 ENCOUNTER — Encounter: Payer: Self-pay | Admitting: Internal Medicine

## 2016-07-27 VITALS — BP 202/84 | HR 71 | Temp 98.4°F | Ht 60.0 in | Wt 138.0 lb

## 2016-07-27 DIAGNOSIS — N183 Chronic kidney disease, stage 3 unspecified: Secondary | ICD-10-CM

## 2016-07-27 DIAGNOSIS — K622 Anal prolapse: Secondary | ICD-10-CM | POA: Diagnosis not present

## 2016-07-27 DIAGNOSIS — I10 Essential (primary) hypertension: Secondary | ICD-10-CM | POA: Diagnosis not present

## 2016-07-27 DIAGNOSIS — F0390 Unspecified dementia without behavioral disturbance: Secondary | ICD-10-CM | POA: Diagnosis not present

## 2016-07-27 DIAGNOSIS — E1121 Type 2 diabetes mellitus with diabetic nephropathy: Secondary | ICD-10-CM

## 2016-07-27 DIAGNOSIS — E785 Hyperlipidemia, unspecified: Secondary | ICD-10-CM

## 2016-07-27 DIAGNOSIS — I5032 Chronic diastolic (congestive) heart failure: Secondary | ICD-10-CM

## 2016-07-27 NOTE — Telephone Encounter (Signed)
Dr. Chilton SiGreen saw patient and daughter [name] today in the office, his note in chart: Dementia without behavioral disturbance, unspecified dementia type Gradually worsening. She is undergoing some personality change with delusions about her daughters and paranoid ideation at times. She has enough physical and mental disability that I do not think she should live alone. Has a new well.
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
## 2016-07-28 ENCOUNTER — Emergency Department (HOSPITAL_COMMUNITY)
Admission: EM | Admit: 2016-07-28 | Discharge: 2016-07-28 | Disposition: A | Discharge status: Home / Self Care | Payer: Medicare Other | Attending: Emergency Medicine | Admitting: Emergency Medicine

## 2016-07-28 ENCOUNTER — Encounter (HOSPITAL_COMMUNITY): Payer: Self-pay | Admitting: *Deleted

## 2016-07-28 DIAGNOSIS — K6289 Other specified diseases of anus and rectum: Secondary | ICD-10-CM | POA: Insufficient documentation

## 2016-07-28 DIAGNOSIS — Z955 Presence of coronary angioplasty implant and graft: Secondary | ICD-10-CM | POA: Insufficient documentation

## 2016-07-28 DIAGNOSIS — I5032 Chronic diastolic (congestive) heart failure: Secondary | ICD-10-CM | POA: Diagnosis not present

## 2016-07-28 DIAGNOSIS — Z79899 Other long term (current) drug therapy: Secondary | ICD-10-CM | POA: Insufficient documentation

## 2016-07-28 DIAGNOSIS — I2510 Atherosclerotic heart disease of native coronary artery without angina pectoris: Secondary | ICD-10-CM | POA: Insufficient documentation

## 2016-07-28 DIAGNOSIS — Z8673 Personal history of transient ischemic attack (TIA), and cerebral infarction without residual deficits: Secondary | ICD-10-CM | POA: Diagnosis not present

## 2016-07-28 DIAGNOSIS — Z7984 Long term (current) use of oral hypoglycemic drugs: Secondary | ICD-10-CM | POA: Insufficient documentation

## 2016-07-28 DIAGNOSIS — N183 Chronic kidney disease, stage 3 (moderate): Secondary | ICD-10-CM | POA: Insufficient documentation

## 2016-07-28 DIAGNOSIS — Z87891 Personal history of nicotine dependence: Secondary | ICD-10-CM | POA: Diagnosis not present

## 2016-07-28 DIAGNOSIS — I130 Hypertensive heart and chronic kidney disease with heart failure and stage 1 through stage 4 chronic kidney disease, or unspecified chronic kidney disease: Secondary | ICD-10-CM | POA: Diagnosis not present

## 2016-07-28 DIAGNOSIS — I252 Old myocardial infarction: Secondary | ICD-10-CM | POA: Insufficient documentation

## 2016-07-28 NOTE — ED Notes (Signed)
Bed: AV40WA23 Expected date:  Expected time:  Means of arrival:  Comments: EMS 83y/o prolasped rectum

## 2016-07-28 NOTE — ED Provider Notes (Signed)
[facility]-EMERGENCY DEPT Provider Note   CSN: [id number] Arrival date & time: 07/28/16  1801     History   Chief Complaint Chief Complaint  Patient presents with  . Rectal Pain    prolapsed rectum    HPI [name] is a 83 y.o. female.  HPI  Patient is a rambling historian. It seems that she is here for a second opinion regarding management of her recurrent rectal prolapse. She has a consistent feeling of needing to have a BM. She states having had a consult for surgery and was not a candidate likely secondary to age but wants another opinion. Did not have a prolapse today just the persistent symptoms.   Past Medical History:  Diagnosis Date  . Anemia   . Carotid artery occlusion   . Carotid bruit   . Chronic kidney disease (CKD), stage III (moderate)   . Coronary artery disease    Remote PCI. Had BMS to RCA and DES stent to Diagonal in Jan. 2012   . Diastolic heart failure    EF 55 to 60% per cath in Jan 2012  . DVT (deep venous thrombosis) (HCC)   . Gout   . Hyperlipidemia   . Hypertension   . Ischemic cardiomyopathy    with prior EF of 35%  . NSTEMI (non-ST elevated myocardial infarction) [facility](HCC) Jan 2012   with PCI to RCA and DX per Dr. Excell Seltzerooper  . Obesity   . Pneumonia 2016  . Proteinuria   . Stroke [facility](HCC) 2013   ?  mini    . TIA (transient ischemic attack)   . Type II diabetes mellitus (HCC)    long standing    Patient Active Problem List   Diagnosis Date Noted  . Hemorrhoids 05/04/2016  . Pain in joint, lower leg 01/20/2016  . Ptosis 11/04/2015  . Glaucoma 08/25/2015  . Dementia 06/10/2015  . Expressive aphasia 05/26/2015  . Anal prolapse 02/25/2015  . Chronic kidney disease, stage III (moderate) 02/10/2014  . PAD (peripheral artery disease) (HCC) 01/17/2014  . Constipation 07/31/2013  . Atherosclerosis of native arteries of the extremities with intermittent claudication-Right Leg 07/04/2013  . Anxiety disorder 02/21/2013  . Diabetes mellitus with  nephropathy (HCC) 07/05/2012  . Occlusion and stenosis of carotid artery without mention of cerebral infarction 06/21/2012  . Coronary artery disease   . Chronic diastolic heart failure, NYHA class 1 (HCC)   . Hyperlipidemia   . Hypertension   . Carotid bruit   . Gout   . Obesity   . TIA (transient ischemic attack)     Past Surgical History:  Procedure Laterality Date  . ABDOMINAL AORTAGRAM N/A 01/17/2014   Procedure: ABDOMINAL Ronny FlurryAORTAGRAM;  Surgeon: Sherren Kernsharles E Fields, MD;  [facility];  Service: Cardiovascular;  Laterality: N/A;  . ANKLE FUSION Bilateral   . APPENDECTOMY    . CATARACT EXTRACTION, BILATERAL Bilateral   . CORONARY ANGIOPLASTY WITH STENT PLACEMENT  04/05/2010   distal RCA  . FRACTURE SURGERY Right 2005   bilateral ankles   . TONSILLECTOMY      OB History    No data available       Home Medications    Prior to Admission medications   Medication Sig Start Date End Date Taking? Authorizing Provider  BETIMOL 0.5 % ophthalmic solution INSTILL 1 DROP INTO THE LEFT EYE DAILY 04/19/16   Kimber RelicGreen, Arthur G, MD  cloNIDine (CATAPRES) 0.1 MG tablet Take 1 tablet (0.1 mg total) by mouth daily. For  high blood pressure 09/04/14   Sharon Seller, NP  docusate sodium (COLACE) 100 MG capsule Take 1 capsule (100 mg total) by mouth 2 (two) times daily as needed for mild constipation. 04/29/16   Loren Racer, MD  ezetimibe-simvastatin (VYTORIN) 10-20 MG tablet 1/2 tablet nightly to control cholesterol 05/26/15   Kimber Relic, MD  furosemide (LASIX) 20 MG tablet Take 20 mg by mouth as needed.    [provider]  hydrocortisone 2.5 % cream  06/07/16   [provider]  lidocaine (XYLOCAINE) 5 % ointment APPLY 1 APPLICATION TOPICALLY 2 TIMES DAILY AS NEEDED 04/29/16   [provider]  losartan (COZAAR) 50 MG tablet TAKE 1 TABLET BY MOUTH EVERY DAY FOR HIGH BLOOD PRESSURE 02/24/16   Kimber Relic, MD  LOTEMAX 0.5 % GEL PLACE 1 DROP IN THE LEFT EYE  TWICE DAILY 05/17/16   Kimber Relic, MD  meloxicam (MOBIC) 15 MG tablet Take 1 tablet (15 mg total) by mouth as needed for pain. For foot and ankle pain 07/08/16   Kimber Relic, MD  metFORMIN (GLUCOPHAGE) 500 MG tablet One twice daily to control glucose 03/08/16   Kimber Relic, MD  nitroGLYCERIN (NITROSTAT) 0.4 MG SL tablet Place one under tongue for chest pain, up to 3 tablets 10/22/15   [provider]  polyethylene glycol powder (GLYCOLAX/MIRALAX) powder DISSOLVE 17 GRAMS (1 CAPFUL) INTO 8 OUNCES OF LIQUID AND DRINK EVERY DAY AS NEEDED FOR BOWELS 05/18/15   Kimber Relic, MD    Family History History reviewed. No pertinent family history.  Social History Social History  Substance Use Topics  . Smoking status: Former Games developer  . Smokeless tobacco: Never Used     Comment: "smoked in my teens; quit after 1 year or so"  . Alcohol use No     Allergies   Namzaric [memantine hcl-donepezil hcl]; Percocet [oxycodone-acetaminophen]; Sulfa antibiotics; Adhesive [tape]; Metformin and related; Tradjenta [linagliptin]; and Penicillins   Review of Systems Review of Systems  All other systems reviewed and are negative.    Physical Exam Updated Vital Signs BP (!) 143/53 (BP Location: Left Arm)   Pulse 66   Resp 17   SpO2 93%   Physical Exam  Constitutional: She is oriented to person, place, and time. She appears well-developed and well-nourished.  HENT:  Head: Normocephalic and atraumatic.  Eyes: Conjunctivae and EOM are normal.  Neck: Normal range of motion.  Cardiovascular: Normal rate and regular rhythm.   Pulmonary/Chest: Effort normal. No stridor. No respiratory distress.  Abdominal: Soft. She exhibits no distension.  Genitourinary: Rectal exam shows external hemorrhoid and anal tone abnormal. Rectal exam shows no internal hemorrhoid and no mass.  Musculoskeletal: Normal range of motion. She exhibits no edema or deformity.  Neurological: She is alert and oriented  to person, place, and time. No cranial nerve deficit. Coordination normal.  Skin: Skin is warm and dry.  Nursing note and vitals reviewed.    ED Treatments / Results  Labs (all labs ordered are listed, but only abnormal results are displayed) Labs Reviewed - No data to display  EKG  EKG Interpretation None       Radiology No results found.  Procedures Procedures (including critical care time)  Medications Ordered in ED Medications - No data to display   Initial Impression / Assessment and Plan / ED Course  I have reviewed the triage vital signs and the nursing notes.  Pertinent labs & imaging results that were available  during my care of the patient were reviewed by me and considered in my medical decision making (see chart for details).     No acute changes here. Will refer back to surgery for reconsideration of possible intervention.   Final Clinical Impressions(s) / ED Diagnoses   Final diagnoses:  Rectal pain    New Prescriptions New Prescriptions   No medications on file     Symphany Fleissner, Barbara Cower, MD 07/28/16 2015

## 2016-07-28 NOTE — ED Triage Notes (Addendum)
Patient is from home via EMS. Pt stated that she was using the restroom having a bowel movement when her rectum prolapsed.Per EMS provider rectum prolapsed approximately 1 inch. Pt reports having a similar episode about 7 months ago. It has now been 45 minutes ago. Pt c/o pain d/t position. Pt denies other symptoms such as n/v and or diarrhea.

## 2016-07-29 ENCOUNTER — Telehealth: Payer: Self-pay

## 2016-07-29 DIAGNOSIS — K6289 Other specified diseases of anus and rectum: Secondary | ICD-10-CM

## 2016-07-29 NOTE — Telephone Encounter (Signed)
If she is not cleared surgically there is not another option that I am aware of

## 2016-07-29 NOTE — Telephone Encounter (Signed)
Message left on clinical intake voicemail:   Patient was seen in the ER for rectal pain. Patient with a prolapsed rectum. Patient was referred to a surgeon, who requested a clearance from cardiology that was denied. Patient is at a stand still and does not know who else can evaluate and treat prolapsed rectum .  Patient's daughter would like a referral or reference to someone who would address a prolapsed rectum   Please advise

## 2016-07-29 NOTE — Telephone Encounter (Signed)
Spoke with [name], [name] would like to confirm that there is not a specialist that can help address this. Patient is in pain and distress from the prolapsed rectum. Patient is either constipated or having bowel leakage.   Patient is convinced that she is going to have surgery, although she was told that she is not cleared for surgery.

## 2016-07-29 NOTE — Telephone Encounter (Signed)
Arna Mediciora aware referral order placed for GI

## 2016-07-29 NOTE — Telephone Encounter (Signed)
The only other thing is she could go to GI and see if they could offer her any additional help

## 2016-08-01 ENCOUNTER — Emergency Department (HOSPITAL_COMMUNITY)
Admission: EM | Admit: 2016-08-01 | Discharge: 2016-08-01 | Disposition: A | Discharge status: Home / Self Care | Payer: Medicare Other | Attending: Emergency Medicine | Admitting: Emergency Medicine

## 2016-08-01 ENCOUNTER — Encounter (HOSPITAL_COMMUNITY): Payer: Self-pay | Admitting: Nurse Practitioner

## 2016-08-01 DIAGNOSIS — I5032 Chronic diastolic (congestive) heart failure: Secondary | ICD-10-CM | POA: Diagnosis not present

## 2016-08-01 DIAGNOSIS — Z7982 Long term (current) use of aspirin: Secondary | ICD-10-CM | POA: Diagnosis not present

## 2016-08-01 DIAGNOSIS — N183 Chronic kidney disease, stage 3 (moderate): Secondary | ICD-10-CM | POA: Diagnosis not present

## 2016-08-01 DIAGNOSIS — Z8673 Personal history of transient ischemic attack (TIA), and cerebral infarction without residual deficits: Secondary | ICD-10-CM | POA: Insufficient documentation

## 2016-08-01 DIAGNOSIS — R339 Retention of urine, unspecified: Secondary | ICD-10-CM | POA: Diagnosis present

## 2016-08-01 DIAGNOSIS — Z955 Presence of coronary angioplasty implant and graft: Secondary | ICD-10-CM | POA: Diagnosis not present

## 2016-08-01 DIAGNOSIS — I2510 Atherosclerotic heart disease of native coronary artery without angina pectoris: Secondary | ICD-10-CM | POA: Insufficient documentation

## 2016-08-01 DIAGNOSIS — Z79899 Other long term (current) drug therapy: Secondary | ICD-10-CM | POA: Diagnosis not present

## 2016-08-01 DIAGNOSIS — I130 Hypertensive heart and chronic kidney disease with heart failure and stage 1 through stage 4 chronic kidney disease, or unspecified chronic kidney disease: Secondary | ICD-10-CM | POA: Insufficient documentation

## 2016-08-01 DIAGNOSIS — I252 Old myocardial infarction: Secondary | ICD-10-CM | POA: Diagnosis not present

## 2016-08-01 DIAGNOSIS — N3000 Acute cystitis without hematuria: Secondary | ICD-10-CM | POA: Diagnosis not present

## 2016-08-01 DIAGNOSIS — N289 Disorder of kidney and ureter, unspecified: Secondary | ICD-10-CM | POA: Diagnosis not present

## 2016-08-01 DIAGNOSIS — Z87891 Personal history of nicotine dependence: Secondary | ICD-10-CM | POA: Insufficient documentation

## 2016-08-01 DIAGNOSIS — Z7984 Long term (current) use of oral hypoglycemic drugs: Secondary | ICD-10-CM | POA: Diagnosis not present

## 2016-08-01 DIAGNOSIS — R338 Other retention of urine: Secondary | ICD-10-CM

## 2016-08-01 LAB — BASIC METABOLIC PANEL
Anion gap: 10 (ref 5–15)
BUN: 54 mg/dL — AB (ref 6–20)
CO2: 23 mmol/L (ref 22–32)
CREATININE: 2.26 mg/dL — AB (ref 0.44–1.00)
Calcium: 8.5 mg/dL — ABNORMAL LOW (ref 8.9–10.3)
Chloride: 108 mmol/L (ref 101–111)
GFR calc Af Amer: 21 mL/min — ABNORMAL LOW (ref >60)
GFR calc non Af Amer: 18 mL/min — ABNORMAL LOW (ref >60)
GLUCOSE: 121 mg/dL — AB (ref 65–99)
Potassium: 4.4 mmol/L (ref 3.5–5.1)
Sodium: 141 mmol/L (ref 135–145)

## 2016-08-01 LAB — CBC
HCT: 41.6 % (ref 36.0–46.0)
HEMOGLOBIN: 13.8 g/dL (ref 12.0–15.0)
MCH: 31 pg (ref 26.0–34.0)
MCHC: 33.2 g/dL (ref 30.0–36.0)
MCV: 93.5 fL (ref 78.0–100.0)
PLATELETS: 146 10*3/uL — AB (ref 150–400)
RBC: 4.45 MIL/uL (ref 3.87–5.11)
RDW: 13.9 % (ref 11.5–15.5)
WBC: 3.9 10*3/uL — ABNORMAL LOW (ref 4.0–10.5)

## 2016-08-01 LAB — URINALYSIS, ROUTINE W REFLEX MICROSCOPIC
BILIRUBIN URINE: NEGATIVE
GLUCOSE, UA: NEGATIVE mg/dL
HGB URINE DIPSTICK: NEGATIVE
KETONES UR: NEGATIVE mg/dL
NITRITE: NEGATIVE
PH: 6 (ref 5.0–8.0)
Protein, ur: 100 mg/dL — AB
RBC / HPF: NONE SEEN RBC/hpf (ref 0–5)
Specific Gravity, Urine: 1.009 (ref 1.005–1.030)

## 2016-08-01 MED ORDER — LIDOCAINE HCL 1 % IJ SOLN
INTRAMUSCULAR | Status: AC
  Administered 2016-08-01: 1.5 mL
  Filled 2016-08-01: qty 20

## 2016-08-01 MED ORDER — DEXTROSE 5 % IV SOLN: 1.0000 g | Freq: Once | INTRAVENOUS | Status: DC

## 2016-08-01 MED ORDER — CEFTRIAXONE SODIUM 1 G IJ SOLR
1.0000 g | Freq: Once | INTRAMUSCULAR | Status: AC
  Administered 2016-08-01: 1 g via INTRAMUSCULAR
  Filled 2016-08-01: qty 10

## 2016-08-01 MED ORDER — CEPHALEXIN 500 MG PO CAPS: 500.0000 mg | ORAL_CAPSULE | Freq: Three times a day (TID) | ORAL | 0 refills | Status: DC

## 2016-08-01 NOTE — ED Notes (Signed)
Pt verbalized understanding of follow up instructions. Catheter connected to leg bag and patient and daughter educated on use and proper cleaning. Both verbalized understanding

## 2016-08-01 NOTE — ED Provider Notes (Signed)
[facility]-EMERGENCY DEPT Provider Note   CSN: [id number] Arrival date & time: 08/01/16  4098  By signing my name below, I, Diona Browner, attest that this documentation has been prepared under the direction and in the presence of Azalia Bilis, MD. Electronically Signed: Diona Browner, ED Scribe. 08/01/16. 3:33 AM.  History   Chief Complaint Chief Complaint  Patient presents with  . Rectal Pain  . Urinary Retention    HPI [name] is a 83 y.o. female BIB EMS with a PMHx of rectal prolapse who presents to the Emergency Department complaining of sharp, severe, rectal pain that started in the afternoon of 07/31/16. Pt rates pain in her rectum as a 10/10 severity. She notes that throughout the day her rectum would come out and she would push it back in. Associated sx include decreased urine. She has had similar sx in the past. Pt denies fever, abdominal pain, nausea, vomiting and diarrhea.  The history is provided by the patient. No language interpreter was used.    Past Medical History:  Diagnosis Date  . Anemia   . Carotid artery occlusion   . Carotid bruit   . Chronic kidney disease (CKD), stage III (moderate)   . Coronary artery disease    Remote PCI. Had BMS to RCA and DES stent to Diagonal in Jan. 2012   . Diastolic heart failure    EF 55 to 60% per cath in Jan 2012  . DVT (deep venous thrombosis) (HCC)   . Gout   . Hyperlipidemia   . Hypertension   . Ischemic cardiomyopathy    with prior EF of 35%  . NSTEMI (non-ST elevated myocardial infarction) [facility]) Jan 2012   with PCI to RCA and DX per Dr. Excell Seltzer  . Obesity   . Pneumonia 2016  . Proteinuria   . Stroke [facility]) 2013   ?  mini    . TIA (transient ischemic attack)   . Type II diabetes mellitus (HCC)    long standing    Patient Active Problem List   Diagnosis Date Noted  . Hemorrhoids 05/04/2016  . Pain in joint, lower leg 01/20/2016  . Ptosis 11/04/2015  . Glaucoma 08/25/2015  . Dementia 06/10/2015  .  Expressive aphasia 05/26/2015  . Anal prolapse 02/25/2015  . Chronic kidney disease, stage III (moderate) 02/10/2014  . PAD (peripheral artery disease) (HCC) 01/17/2014  . Constipation 07/31/2013  . Atherosclerosis of native arteries of the extremities with intermittent claudication-Right Leg 07/04/2013  . Anxiety disorder 02/21/2013  . Diabetes mellitus with nephropathy (HCC) 07/05/2012  . Occlusion and stenosis of carotid artery without mention of cerebral infarction 06/21/2012  . Coronary artery disease   . Chronic diastolic heart failure, NYHA class 1 (HCC)   . Hyperlipidemia   . Hypertension   . Carotid bruit   . Gout   . Obesity   . TIA (transient ischemic attack)     Past Surgical History:  Procedure Laterality Date  . ABDOMINAL AORTAGRAM N/A 01/17/2014   Procedure: ABDOMINAL Ronny Flurry;  Surgeon: Sherren Kerns, MD;  Location: [facility] CATH LAB;  Service: Cardiovascular;  Laterality: N/A;  . ANKLE FUSION Bilateral   . APPENDECTOMY    . CATARACT EXTRACTION, BILATERAL Bilateral   . CORONARY ANGIOPLASTY WITH STENT PLACEMENT  04/05/2010   distal RCA  . FRACTURE SURGERY Right 2005   bilateral ankles   . TONSILLECTOMY      OB History    No data available       Home  Medications    Prior to Admission medications   Medication Sig Start Date End Date Taking? Authorizing Provider  aspirin EC 81 MG tablet Take 81 mg by mouth daily.   Yes [provider]  BETIMOL 0.5 % ophthalmic solution INSTILL 1 DROP INTO THE LEFT EYE DAILY 04/19/16  Yes Kimber RelicGreen, Arthur G, MD  cloNIDine (CATAPRES) 0.1 MG tablet Take 1 tablet (0.1 mg total) by mouth daily. For high blood pressure 09/04/14  Yes Eubanks, Janene HarveyJessica K, NP  ezetimibe-simvastatin (VYTORIN) 10-20 MG tablet 1/2 tablet nightly to control cholesterol Patient taking differently: Take 0.5 tablets by mouth at bedtime.  05/26/15  Yes Kimber RelicGreen, Arthur G, MD  furosemide (LASIX) 20 MG tablet Take 20 mg by mouth 2 (two) times daily.    Yes  [provider]  losartan (COZAAR) 50 MG tablet TAKE 1 TABLET BY MOUTH EVERY DAY FOR HIGH BLOOD PRESSURE Patient taking differently: TAKE 50 MG BY MOUTH ONCE DAILY. 02/24/16  Yes Kimber RelicGreen, Arthur G, MD  LOTEMAX 0.5 % GEL PLACE 1 DROP IN THE LEFT EYE TWICE DAILY 05/17/16  Yes Kimber RelicGreen, Arthur G, MD  metFORMIN (GLUCOPHAGE) 500 MG tablet One twice daily to control glucose Patient taking differently: Take 500 mg by mouth 2 (two) times daily with a meal.  03/08/16  Yes Kimber RelicGreen, Arthur G, MD  Nebivolol HCl (BYSTOLIC) 20 MG TABS Take 20 mg by mouth daily.   Yes [provider]  nitroGLYCERIN (NITROSTAT) 0.4 MG SL tablet Place one under tongue for chest pain, up to 3 tablets 10/22/15  Yes [provider]  polyethylene glycol powder (GLYCOLAX/MIRALAX) powder DISSOLVE 17 GRAMS (1 CAPFUL) INTO 8 OUNCES OF LIQUID AND DRINK EVERY DAY AS NEEDED FOR BOWELS Patient taking differently: DISSOLVE 17 GRAMS (1 CAPFUL) INTO 8 OUNCES OF LIQUID AND DRINK EVERY DAY 05/18/15  Yes Kimber RelicGreen, Arthur G, MD  sitaGLIPtin (JANUVIA) 100 MG tablet Take 100 mg by mouth daily.   Yes [provider]  docusate sodium (COLACE) 100 MG capsule Take 1 capsule (100 mg total) by mouth 2 (two) times daily as needed for mild constipation. Patient not taking: Reported on 08/01/2016 04/29/16   Loren RacerYelverton, David, MD  meloxicam (MOBIC) 15 MG tablet Take 1 tablet (15 mg total) by mouth as needed for pain. For foot and ankle pain Patient not taking: Reported on 08/01/2016 07/08/16   Kimber RelicGreen, Arthur G, MD    Family History History reviewed. No pertinent family history.  Social History Social History  Substance Use Topics  . Smoking status: Former Games developermoker  . Smokeless tobacco: Never Used     Comment: "smoked in my teens; quit after 1 year or so"  . Alcohol use No     Allergies   Namzaric [memantine hcl-donepezil hcl]; Percocet [oxycodone-acetaminophen]; Sulfa antibiotics; Adhesive [tape]; Metformin and related; Tradjenta  [linagliptin]; and Penicillins   Review of Systems Review of Systems  A complete 10 system review of systems was obtained and all systems are negative except as noted in the HPI and PMH.    Physical Exam Updated Vital Signs BP 106/88 (BP Location: Left Arm)   Pulse 60   Temp 98 F (36.7 C) (Oral)   Resp 18   Ht 5' (1.524 m)   Wt 138 lb (62.6 kg)   SpO2 95%   BMI 26.95 kg/m   Physical Exam  Constitutional: She is oriented to person, place, and time. She appears well-developed and well-nourished. No distress.  HENT:  Head: Normocephalic and atraumatic.  Eyes: EOM are  normal.  Neck: Normal range of motion.  Cardiovascular: Normal rate, regular rhythm and normal heart sounds.   Pulmonary/Chest: Effort normal and breath sounds normal.  Abdominal: Soft. She exhibits no distension. There is tenderness.  Mild suprapubic tenderness.  Musculoskeletal: Normal range of motion.  Neurological: She is alert and oriented to person, place, and time.  Skin: Skin is warm and dry.  Psychiatric: She has a normal mood and affect. Judgment normal.  Nursing note and vitals reviewed.    ED Treatments / Results  DIAGNOSTIC STUDIES: Oxygen Saturation is 95% on RA, adequate by my interpretation.   COORDINATION OF CARE: 3:33 AM-Discussed next steps with pt. Pt verbalized understanding and is agreeable with the plan.   Labs (all labs ordered are listed, but only abnormal results are displayed) Labs Reviewed  URINALYSIS, ROUTINE W REFLEX MICROSCOPIC - Abnormal; Notable for the following:       Result Value   APPearance CLOUDY (*)    Protein, ur 100 (*)    Leukocytes, UA LARGE (*)    Bacteria, UA MANY (*)    Squamous Epithelial / LPF 0-5 (*)    All other components within normal limits  BASIC METABOLIC PANEL - Abnormal; Notable for the following:    Glucose, Bld 121 (*)    BUN 54 (*)    Creatinine, Ser 2.26 (*)    Calcium 8.5 (*)    GFR calc non Af Amer 18 (*)    GFR calc Af Amer 21  (*)    All other components within normal limits  CBC - Abnormal; Notable for the following:    WBC 3.9 (*)    Platelets 146 (*)    All other components within normal limits  URINE CULTURE    EKG  EKG Interpretation None       Radiology No results found.  Procedures Procedures (including critical care time)  Medications Ordered in ED Medications  cefTRIAXone (ROCEPHIN) injection 1 g (not administered)     Initial Impression / Assessment and Plan / ED Course  I have reviewed the triage vital signs and the nursing notes.  Pertinent labs & imaging results that were available during my care of the patient were reviewed by me and considered in my medical decision making (see chart for details).     Patient with acute urinary retention.  This resolved with placement of catheter in the emergency department.  600 cc of urine came out.  She reports some dysuria earlier today.  She does appear to have urinary tract infection.  I am Rocephin given.  Home with Keflex.  Urine culture sent.  Mild renal insufficiency as compared to prior likely secondary to acute urinary retention.  This now has been relieved.  Close primary care and urology follow-up.  She understands to return to the ER for new or worsening symptoms  Final Clinical Impressions(s) / ED Diagnoses   Final diagnoses:  Acute urinary retention  Acute cystitis without hematuria  Renal insufficiency    New Prescriptions New Prescriptions   CEPHALEXIN (KEFLEX) 500 MG CAPSULE    Take 1 capsule (500 mg total) by mouth 3 (three) times daily.   I personally performed the services described in this documentation, which was scribed in my presence. The recorded information has been reviewed and is accurate.       Azalia Bilis, MD 08/01/16 [phone]

## 2016-08-01 NOTE — ED Notes (Signed)
Bed: WA09 Expected date:  Expected time:  Means of arrival:  Comments: 83yo f/ abd pain

## 2016-08-01 NOTE — ED Triage Notes (Signed)
Pt is from home via EMS. Per ems she called and stated she started feeling sharp rectal pain as if she is having rectal prolapse (hx of this). States pain is 10/10 in rectum. Pt reports having this type of episode several months ago. Denies any other symptoms such as pain, n/v/d. Patient did inform ems that she hasn't urinated much today either.  VS: 164/54, 68 Hr, 20 resp, 98% RA, NSR.

## 2016-08-02 ENCOUNTER — Telehealth: Payer: Self-pay | Admitting: *Deleted

## 2016-08-02 NOTE — Telephone Encounter (Signed)
May try tylenol 500 mg 2 tablets every 8 hours as needed pain

## 2016-08-02 NOTE — Telephone Encounter (Signed)
Stark BrayLynda, daughter, notified and agreed.

## 2016-08-02 NOTE — Telephone Encounter (Signed)
Patient daughter, Stark BrayLynda called and stated that patient was in ER yesterday from Rectal pain. Daughter is wondering if anything could be given for the pain that is OTC. Patient is using ice, easing the pain but still painful. No available appointments and daughter does not want to take patient back to the ER. ER only gave antibiotic for UTI and she is taking it and has an appointment with Urology on Thursday. Please Advise.

## 2016-08-03 LAB — URINE CULTURE
Culture: >=100000 — AB
SPECIAL REQUESTS: NORMAL

## 2016-08-08 ENCOUNTER — Other Ambulatory Visit: Payer: Self-pay | Admitting: Physician Assistant

## 2016-08-08 DIAGNOSIS — R634 Abnormal weight loss: Secondary | ICD-10-CM

## 2016-08-16 ENCOUNTER — Other Ambulatory Visit: Payer: Self-pay | Admitting: *Deleted

## 2016-08-16 DIAGNOSIS — E1121 Type 2 diabetes mellitus with diabetic nephropathy: Secondary | ICD-10-CM

## 2016-08-16 MED ORDER — METFORMIN HCL 500 MG PO TABS: ORAL_TABLET | ORAL | 0 refills | Status: DC

## 2016-08-16 MED ORDER — CLONIDINE HCL 0.1 MG PO TABS: 0.1000 mg | ORAL_TABLET | Freq: Every day | ORAL | 0 refills | Status: DC

## 2016-08-16 MED ORDER — FUROSEMIDE 20 MG PO TABS: 20.0000 mg | ORAL_TABLET | Freq: Two times a day (BID) | ORAL | 0 refills | Status: DC

## 2016-08-16 MED ORDER — SITAGLIPTIN PHOSPHATE 100 MG PO TABS
100.0000 mg | ORAL_TABLET | Freq: Every day | ORAL | 0 refills | Status: DC
Start: 2016-08-16 — End: 2016-08-24

## 2016-08-16 MED ORDER — LOSARTAN POTASSIUM 50 MG PO TABS
50.0000 mg | ORAL_TABLET | Freq: Every day | ORAL | 0 refills | Status: DC
Start: 2016-08-16 — End: 2016-08-24

## 2016-08-16 MED ORDER — EZETIMIBE-SIMVASTATIN 10-20 MG PO TABS: ORAL_TABLET | ORAL | 0 refills | Status: DC

## 2016-08-16 MED ORDER — NEBIVOLOL HCL 20 MG PO TABS: 20.0000 mg | ORAL_TABLET | Freq: Every day | ORAL | 0 refills | Status: DC
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
## 2016-08-17 ENCOUNTER — Ambulatory Visit
Admission: RE | Admit: 2016-08-17 | Discharge: 2016-08-17 | Disposition: A | Discharge status: Home / Self Care | Payer: Medicare Other | Source: Ambulatory Visit | Attending: Physician Assistant | Admitting: Physician Assistant

## 2016-08-17 DIAGNOSIS — R634 Abnormal weight loss: Secondary | ICD-10-CM

## 2016-08-22 ENCOUNTER — Other Ambulatory Visit: Payer: Self-pay | Admitting: *Deleted

## 2016-08-22 MED ORDER — POLYETHYLENE GLYCOL 3350 17 GM/SCOOP PO POWD: ORAL | 5 refills | Status: DC

## 2016-08-22 NOTE — Telephone Encounter (Signed)
Patient caregiver requested.  

## 2016-08-24 ENCOUNTER — Other Ambulatory Visit: Payer: Self-pay

## 2016-08-24 ENCOUNTER — Ambulatory Visit (HOSPITAL_COMMUNITY)
Admission: RE | Admit: 2016-08-24 | Discharge: 2016-08-24 | Disposition: A | Discharge status: Home / Self Care | Payer: Medicare Other | Source: Ambulatory Visit | Attending: Vascular Surgery | Admitting: Vascular Surgery

## 2016-08-24 ENCOUNTER — Encounter: Payer: Self-pay | Admitting: Internal Medicine

## 2016-08-24 ENCOUNTER — Ambulatory Visit (INDEPENDENT_AMBULATORY_CARE_PROVIDER_SITE_OTHER): Payer: Medicare Other | Admitting: Internal Medicine

## 2016-08-24 ENCOUNTER — Telehealth: Payer: Self-pay | Admitting: *Deleted

## 2016-08-24 VITALS — BP 164/64 | HR 64 | Temp 97.3°F | Ht 60.0 in | Wt 137.4 lb

## 2016-08-24 DIAGNOSIS — E1321 Other specified diabetes mellitus with diabetic nephropathy: Secondary | ICD-10-CM

## 2016-08-24 DIAGNOSIS — I70213 Atherosclerosis of native arteries of extremities with intermittent claudication, bilateral legs: Secondary | ICD-10-CM

## 2016-08-24 DIAGNOSIS — E1121 Type 2 diabetes mellitus with diabetic nephropathy: Secondary | ICD-10-CM

## 2016-08-24 DIAGNOSIS — E785 Hyperlipidemia, unspecified: Secondary | ICD-10-CM | POA: Diagnosis not present

## 2016-08-24 DIAGNOSIS — M79661 Pain in right lower leg: Secondary | ICD-10-CM

## 2016-08-24 DIAGNOSIS — I10 Essential (primary) hypertension: Secondary | ICD-10-CM

## 2016-08-24 DIAGNOSIS — F0390 Unspecified dementia without behavioral disturbance: Secondary | ICD-10-CM

## 2016-08-24 DIAGNOSIS — I5032 Chronic diastolic (congestive) heart failure: Secondary | ICD-10-CM

## 2016-08-24 LAB — MICROALBUMIN / CREATININE URINE RATIO
CREATININE, URINE: 46 mg/dL (ref 20–320)
MICROALB UR: 47.5 mg/dL
Microalb Creat Ratio: 1033 mcg/mg creat — ABNORMAL HIGH (ref <30)

## 2016-08-24 MED ORDER — POLYETHYLENE GLYCOL 3350 17 GM/SCOOP PO POWD: ORAL | 5 refills | Status: DC

## 2016-08-24 MED ORDER — DOCUSATE SODIUM 100 MG PO CAPS: 100.0000 mg | ORAL_CAPSULE | Freq: Two times a day (BID) | ORAL | 3 refills | Status: DC | PRN

## 2016-08-24 MED ORDER — NEBIVOLOL HCL 20 MG PO TABS: 20.0000 mg | ORAL_TABLET | Freq: Every day | ORAL | 3 refills | Status: DC

## 2016-08-24 MED ORDER — CLONIDINE HCL 0.1 MG PO TABS: 0.1000 mg | ORAL_TABLET | Freq: Every day | ORAL | 3 refills | Status: DC

## 2016-08-24 MED ORDER — EZETIMIBE-SIMVASTATIN 10-20 MG PO TABS: ORAL_TABLET | ORAL | 3 refills | Status: DC

## 2016-08-24 MED ORDER — LOSARTAN POTASSIUM 50 MG PO TABS: 50.0000 mg | ORAL_TABLET | Freq: Every day | ORAL | 3 refills | Status: DC

## 2016-08-24 MED ORDER — MELOXICAM 15 MG PO TABS: 15.0000 mg | ORAL_TABLET | ORAL | 3 refills | Status: DC | PRN

## 2016-08-24 MED ORDER — SITAGLIPTIN PHOSPHATE 100 MG PO TABS: 100.0000 mg | ORAL_TABLET | Freq: Every day | ORAL | 3 refills | Status: DC

## 2016-08-24 MED ORDER — METFORMIN HCL 500 MG PO TABS: ORAL_TABLET | ORAL | 3 refills | Status: DC

## 2016-08-24 MED ORDER — FUROSEMIDE 20 MG PO TABS: 20.0000 mg | ORAL_TABLET | Freq: Two times a day (BID) | ORAL | 3 refills | Status: DC

## 2016-08-24 NOTE — Telephone Encounter (Signed)
Tammy with [facility] called and stated that they did the Right Venous Doppler and it was Negative for DVT

## 2016-08-24 NOTE — Patient Instructions (Signed)
Will call with leg US appt  Will call with lab results  Continue current medications as ordered. New scripts sent to your pharmacy  Follow up with specialists as scheduled  Follow up in 1 month for DM, HTN, right calf swelling

## 2016-08-24 NOTE — Progress Notes (Signed)
Patient ID: [name], female   DOB: 06-05-1927, 83 y.o.   MRN: [id number]    Location:  [facility] Place of Service: OFFICE  Chief Complaint  Patient presents with  . Medical Management of Chronic Issues    4 week routine visit, Patient is unsure of the medications she is taking.She also complains of being up all night and could not sleep.   . Immunizations    Discuss Shingles vacc.  . Other    Diabetic Foot Exam    HPI:  83 yo female former pt of Dr Rolly Salter presenting today for f/u. She is a poor historian due to dementia. Hx obtained from chart. She states she had a large and several small vesicles to form on her right medial distal leg-->foot several weeks go. She kept a dry dressing on it and they opened on their own. Min redness but no d/c.   She was tx for urinary retention and UTI in the ED on 08/01/16. Foley cath inserted and 600cc out. She completed keflex tx.   DM - A1c 7.7%. No low BS reactions. She takes metformin, januvia  HTN - BP stable on bystolic, clonidine, cozaar  Chronic diastolic HF/CAD - stable on SLNTG prn, lasix  Hx TIA/hx CVA - she has expressive aphasia. She takes ASA daily  PAD/carotid artery stenosis - stable on ASA daily  Dementia - she doe not take med for cognition. last MMSE 25/30 in 2017  CKD - stage 4. Cr 2.26  Hyperlipidemia - stable on vytorin. LDL  Anxiety d/o - mood stable without meds  Glaucoma - stable on betimol/lotemax eye gtts  Constipation - stable on miralax and colace  Anal prolapse - she still has problems but no worsening of sx's  Past Medical History:  Diagnosis Date  . Anemia   . Carotid artery occlusion   . Carotid bruit   . Chronic kidney disease (CKD), stage III (moderate)   . Coronary artery disease    Remote PCI. Had BMS to RCA and DES stent to Diagonal in Jan. 2012   . Diastolic heart failure    EF 55 to 60% per cath in Jan 2012  . DVT (deep venous thrombosis) ([place])   . Gout   . Hyperlipidemia   .  Hypertension   . Ischemic cardiomyopathy    with prior EF of 35%  . NSTEMI (non-ST elevated myocardial infarction) [facility]) Jan 2012   with PCI to RCA and DX per Dr. Burt Knack  . Obesity   . Pneumonia 2016  . Proteinuria   . Stroke [facility]) 2013   ?  mini    . TIA (transient ischemic attack)   . Type II diabetes mellitus ([place])    long standing    Past Surgical History:  Procedure Laterality Date  . ABDOMINAL AORTAGRAM N/A 01/17/2014   Procedure: ABDOMINAL Maxcine Ham;  Surgeon: Elam Dutch, MD;  Location: [facility] CATH LAB;  Service: Cardiovascular;  Laterality: N/A;  . ANKLE FUSION Bilateral   . APPENDECTOMY    . CATARACT EXTRACTION, BILATERAL Bilateral   . CORONARY ANGIOPLASTY WITH STENT PLACEMENT  04/05/2010   distal RCA  . FRACTURE SURGERY Right 2005   bilateral ankles   . TONSILLECTOMY      Patient Care Team: Estill Dooms, MD as PCP - General (Internal Medicine) Burtis Junes, NP as Nurse Practitioner (Cardiology) Hayden Pedro, MD as Consulting Physician (Ophthalmology) Gayland Curry, DO as Consulting Physician (Geriatric Medicine) Elam Dutch, MD as  Consulting Physician (Vascular Surgery) Ronald Lobo, MD as Consulting Physician (Gastroenterology) Sherren Mocha, MD as Consulting Physician (Cardiology) Michael Boston, MD as Consulting Physician (General Surgery)  Social History   Social History  . Marital status: Widowed    Spouse name: N/A  . Number of children: N/A  . Years of education: N/A   Occupational History  . retired OR Marine scientist    Social History Main Topics  . Smoking status: Former Research scientist (life sciences)  . Smokeless tobacco: Never Used     Comment: "smoked in my teens; quit after 1 year or so"  . Alcohol use No  . Drug use: No  . Sexual activity: No   Other Topics Concern  . Not on file   Social History Narrative   Widow   Former smoker   Alcohol none   No Advance Directive        reports that she has quit smoking. She has never used  smokeless tobacco. She reports that she does not drink alcohol or use drugs.  History reviewed. No pertinent family history. Family Status  Relation Status  . Mother Deceased       murdered  . Father Deceased       Died while working  . MGM Deceased  . MGF Deceased  . PGM Deceased  . PGF Deceased  . Daughter Alive  . Daughter Alive  . Sister Deceased       Died at 61 years old     Allergies  Allergen Reactions  . Namzaric [Memantine Hcl-Donepezil Hcl] Nausea Only    confusion  . Percocet [Oxycodone-Acetaminophen] Other (See Comments)    loosy goosy  . Sulfa Antibiotics Other (See Comments)    Tongue swells  . Adhesive [Tape] Other (See Comments)    Tears skin.  Please use "paper" tape  . Metformin And Related Other (See Comments)    Abnormal Kidney Functions   . Tradjenta [Linagliptin]     Low back ache, resolved once medication stopped   . Penicillins Hives    Tolerates Ceftriaxone, Has patient had a PCN reaction causing immediate rash, facial/tongue/throat swelling, SOB or lightheadedness with hypotension: Unknown Has patient had a PCN reaction causing severe rash involving mucus membranes or skin necrosis: No Has patient had a PCN reaction that required hospitalization No Has patient had a PCN reaction occurring within the last 10 years: No If all of the above answers are "NO", then may proceed with Cephalosporin use.     Medications: Patient's Medications  New Prescriptions   No medications on file  Previous Medications   ASPIRIN EC 81 MG TABLET    Take 81 mg by mouth daily.   BETIMOL 0.5 % OPHTHALMIC SOLUTION    INSTILL 1 DROP INTO THE LEFT EYE DAILY   CEPHALEXIN (KEFLEX) 500 MG CAPSULE    Take 1 capsule (500 mg total) by mouth 3 (three) times daily.   CLONIDINE (CATAPRES) 0.1 MG TABLET    Take 1 tablet (0.1 mg total) by mouth daily. For high blood pressure   DOCUSATE SODIUM (COLACE) 100 MG CAPSULE    Take 1 capsule (100 mg total) by mouth 2 (two) times daily  as needed for mild constipation.   EZETIMIBE-SIMVASTATIN (VYTORIN) 10-20 MG TABLET    Take 1/2 tablet by mouth once nightly to control cholesterol   FUROSEMIDE (LASIX) 20 MG TABLET    Take 1 tablet (20 mg total) by mouth 2 (two) times daily.   LOSARTAN (COZAAR) 50 MG TABLET  Take 1 tablet (50 mg total) by mouth daily. for high blood pressure   LOTEMAX 0.5 % GEL    PLACE 1 DROP IN THE LEFT EYE TWICE DAILY   MELOXICAM (MOBIC) 15 MG TABLET    Take 1 tablet (15 mg total) by mouth as needed for pain. For foot and ankle pain   METFORMIN (GLUCOPHAGE) 500 MG TABLET    Take One tablet by mouth twice daily to control glucose   NEBIVOLOL HCL (BYSTOLIC) 20 MG TABS    Take 1 tablet (20 mg total) by mouth daily.   NITROGLYCERIN (NITROSTAT) 0.4 MG SL TABLET    Place one under tongue for chest pain, up to 3 tablets   POLYETHYLENE GLYCOL POWDER (GLYCOLAX/MIRALAX) POWDER    DISSOLVE 17 GRAMS (1 CAPFUL) INTO 8 OUNCES OF LIQUID AND DRINK EVERY DAY AS NEEDED FOR BOWELS   SITAGLIPTIN (JANUVIA) 100 MG TABLET    Take 1 tablet (100 mg total) by mouth daily.  Modified Medications   No medications on file  Discontinued Medications   No medications on file    Review of Systems  Unable to perform ROS: Dementia    Vitals:   08/24/16 0849  BP: (!) 164/64  Pulse: 64  Temp: 97.3 F (36.3 C)  TempSrc: Oral  SpO2: 92%  Weight: 137 lb 6.4 oz (62.3 kg)  Height: 5' (1.524 m)   Body mass index is 26.83 kg/m.  Physical Exam  Constitutional: She is oriented to person, place, and time. She appears well-developed and well-nourished.  HENT:  Mouth/Throat: Oropharynx is clear and moist. No oropharyngeal exudate.  MMM; no oral thrush  Eyes: Pupils are equal, round, and reactive to light. No scleral icterus.  Neck: Neck supple. Carotid bruit is present. No tracheal deviation present.  Cardiovascular: Normal rate, regular rhythm and intact distal pulses.  Exam reveals no gallop and no friction rub.   Murmur (1/6 SEM)  heard. Chronic venous stasis changes in LE b/l. riht medial calf TTP with palpable cord. +1 pitting LE edema b/l.  Pulmonary/Chest: Effort normal and breath sounds normal. No stridor. No respiratory distress. She has no wheezes. She has no rales.  Abdominal: Soft. Bowel sounds are normal. She exhibits no distension and no mass. There is no hepatomegaly. There is no tenderness. There is no rebound and no guarding.  Musculoskeletal: She exhibits edema and tenderness.  Lymphadenopathy:    She has no cervical adenopathy.  Neurological: She is alert and oriented to person, place, and time. She has normal reflexes.  Skin: Skin is warm and dry. No rash noted.  Psychiatric: She has a normal mood and affect. Her behavior is normal. Judgment and thought content normal.   Diabetic Foot Exam - Simple   Simple Foot Form Diabetic Foot exam was performed with the following findings:  Yes 08/24/2016  9:48 AM  Visual Inspection See comments:  Yes Sensation Testing Intact to touch and monofilament testing bilaterally:  Yes Pulse Check See comments:  Yes Comments Medial right distal leg deformity; right calf TTP with swelling; reduced DP/PT pulse palpable       Labs reviewed: Admission on 08/01/2016, Discharged on 08/01/2016  Component Date Value Ref Range Status  . Color, Urine 08/01/2016 YELLOW  YELLOW Final  . APPearance 08/01/2016 CLOUDY* CLEAR Final  . Specific Gravity, Urine 08/01/2016 1.009  1.005 - 1.030 Final  . pH 08/01/2016 6.0  5.0 - 8.0 Final  . Glucose, UA 08/01/2016 NEGATIVE  NEGATIVE mg/dL Final  . Hgb urine  dipstick 08/01/2016 NEGATIVE  NEGATIVE Final  . Bilirubin Urine 08/01/2016 NEGATIVE  NEGATIVE Final  . Ketones, ur 08/01/2016 NEGATIVE  NEGATIVE mg/dL Final  . Protein, ur 08/01/2016 100* NEGATIVE mg/dL Final  . Nitrite 08/01/2016 NEGATIVE  NEGATIVE Final  . Leukocytes, UA 08/01/2016 LARGE* NEGATIVE Final  . RBC / HPF 08/01/2016 NONE SEEN  0 - 5 RBC/hpf Final  . WBC, UA  08/01/2016 TOO NUMEROUS TO COUNT  0 - 5 WBC/hpf Final  . Bacteria, UA 08/01/2016 MANY* NONE SEEN Final  . Squamous Epithelial / LPF 08/01/2016 0-5* NONE SEEN Final  . WBC Clumps 08/01/2016 PRESENT   Final  . Hyaline Casts, UA 08/01/2016 PRESENT   Final  . Sodium 08/01/2016 141  135 - 145 mmol/L Final  . Potassium 08/01/2016 4.4  3.5 - 5.1 mmol/L Final  . Chloride 08/01/2016 108  101 - 111 mmol/L Final  . CO2 08/01/2016 23  22 - 32 mmol/L Final  . Glucose, Bld 08/01/2016 121* 65 - 99 mg/dL Final  . BUN 08/01/2016 54* 6 - 20 mg/dL Final  . Creatinine, Ser 08/01/2016 2.26* 0.44 - 1.00 mg/dL Final  . Calcium 08/01/2016 8.5* 8.9 - 10.3 mg/dL Final  . GFR calc non Af Amer 08/01/2016 18* >60 mL/min Final  . GFR calc Af Amer 08/01/2016 21* >60 mL/min Final   Comment: (NOTE) The eGFR has been calculated using the CKD EPI equation. This calculation has not been validated in all clinical situations. eGFR's persistently <60 mL/min signify possible Chronic Kidney Disease.   . Anion gap 08/01/2016 10  5 - 15 Final  . WBC 08/01/2016 3.9* 4.0 - 10.5 K/uL Final  . RBC 08/01/2016 4.45  3.87 - 5.11 MIL/uL Final  . Hemoglobin 08/01/2016 13.8  12.0 - 15.0 g/dL Final  . HCT 08/01/2016 41.6  36.0 - 46.0 % Final  . MCV 08/01/2016 93.5  78.0 - 100.0 fL Final  . MCH 08/01/2016 31.0  26.0 - 34.0 pg Final  . MCHC 08/01/2016 33.2  30.0 - 36.0 g/dL Final  . RDW 08/01/2016 13.9  11.5 - 15.5 % Final  . Platelets 08/01/2016 146* 150 - 400 K/uL Final  . Specimen Description 08/01/2016 URINE, CLEAN CATCH   Final  . Special Requests 08/01/2016 Normal   Final  . Culture 08/01/2016 >=100,000 COLONIES/mL ESCHERICHIA COLI*  Final  . Report Status 08/01/2016 08/03/2016 FINAL   Final  . Organism ID, Bacteria 08/01/2016 ESCHERICHIA COLI*  Final    Ct Abdomen Pelvis Wo Contrast  Result Date: 08/17/2016 CLINICAL DATA:  83 year old with history of prolapsed rectum and persistent pain. Weight loss. EXAM: CT ABDOMEN AND  PELVIS WITHOUT CONTRAST TECHNIQUE: Multidetector CT imaging of the abdomen and pelvis was performed following the standard protocol without IV contrast. COMPARISON:  06/04/2014 FINDINGS: Lower chest: Mild pleural thickening along the medial lung bases which appears to be chronic. Mild scarring at the lung bases. No large pleural effusions. Coronary artery calcifications. Hepatobiliary: Normal appearance of the liver and gallbladder. No significant biliary dilatation. Pancreas: Evidence for pancreatic atrophy with scattered areas of fatty deposition, particularly in the head and uncinate process. Pancreas head and uncinate process are difficult to evaluate due to this fatty change. No evidence for inflammation or duct dilatation. Spleen: Normal appearance of spleen without enlargement. Adrenals/Urinary Tract: Adrenal glands are normal. Calcification in the left renal hilum may be vascular. Minimal dilatation of the left renal pelvis without any significant hydronephrosis. Urinary bladder is unremarkable. Again noted is mild dilatation of the right  renal pelvis without hydronephrosis. Stomach/Bowel: Normal appearance of stomach and duodenum. Oral contrast within the small and large bowel. No evidence for a bowel obstruction. Appendix has been removed. Moderate amount of stool throughout the colon. There is wall thickening in the rectum measuring up to 6 mm on sequence 2, image 76. There may be mild perirectal fat edema as well. There is concern for some subcutaneous edema in the perineum. Limited evaluation of the anus. Oral contrast is not within the distal colon or rectum. Vascular/Lymphatic: Again noted are prominent inguinal lymph nodes, particularly on the right side. These are not significantly changed since 2016. No suspicious lymphadenopathy in the abdomen or pelvis. Severe wall calcifications involving the abdominal aorta. Visceral arteries are heavily calcified. No evidence for an aortic aneurysm. Common  iliac arteries are heavily calcified with vascular stents. Reproductive: Stable low-density structure involving the left adnexa measuring up to 3.1 cm. This was compatible with a simple cyst on the ultrasound from 06/26/2014. Again noted is a calcified uterine fibroid. Other: No free fluid.  Negative for free air. Musculoskeletal: Multilevel facet disease in lower lumbar spine. Grade 1 retrolisthesis and severe disc space loss at L2-L3. IMPRESSION: Rectal wall thickening with mild perirectal edema. Findings are suggestive for proctitis. Limited evaluation of the distal rectum and anus on this noncontrast examination. Diffuse atherosclerotic disease. Stable left adnexal cyst. Electronically Signed   By: Markus Daft M.D.   On: 08/17/2016 12:53     Assessment/Plan   ICD-10-CM   1. Right calf pain M79.661 VAS [place] LOWER EXTREMITY VENOUS (DVT)  2. Essential hypertension I10   3. Atherosclerosis of native artery of both lower extremities with intermittent claudication ([place]) I70.213   4. Dementia without behavioral disturbance, unspecified dementia type F03.90   5. Chronic diastolic heart failure, NYHA class 1 (HCC) I50.32   6. Diabetes mellitus with nephropathy (HCC) E11.21 Microalbumin/Creatinine Ratio, Urine    metFORMIN (GLUCOPHAGE) 500 MG tablet  7. Hyperlipidemia, unspecified hyperlipidemia type E78.5    Will call with leg [place] appt  Will call with lab results  Continue current medications as ordered. New scripts sent to your pharmacy  Follow up with specialists as scheduled  Follow up in 1 month for DM, HTN, right calf swelling    Jaxten Brosh S. Perlie Gold  [facility] [address] [place], [place] [address] [phone] Cell (Monday-Friday 8 AM - 5 PM) [phone] After 5 PM and follow prompts

## 2016-08-26 ENCOUNTER — Encounter (HOSPITAL_COMMUNITY): Payer: Self-pay

## 2016-08-26 ENCOUNTER — Emergency Department (HOSPITAL_COMMUNITY)
Admission: EM | Admit: 2016-08-26 | Discharge: 2016-08-26 | Disposition: A | Discharge status: Home / Self Care | Payer: Medicare Other | Attending: Emergency Medicine | Admitting: Emergency Medicine

## 2016-08-26 DIAGNOSIS — Z7984 Long term (current) use of oral hypoglycemic drugs: Secondary | ICD-10-CM | POA: Diagnosis not present

## 2016-08-26 DIAGNOSIS — Z79899 Other long term (current) drug therapy: Secondary | ICD-10-CM | POA: Insufficient documentation

## 2016-08-26 DIAGNOSIS — I2510 Atherosclerotic heart disease of native coronary artery without angina pectoris: Secondary | ICD-10-CM | POA: Diagnosis not present

## 2016-08-26 DIAGNOSIS — N183 Chronic kidney disease, stage 3 (moderate): Secondary | ICD-10-CM | POA: Insufficient documentation

## 2016-08-26 DIAGNOSIS — L03115 Cellulitis of right lower limb: Secondary | ICD-10-CM | POA: Insufficient documentation

## 2016-08-26 DIAGNOSIS — E1122 Type 2 diabetes mellitus with diabetic chronic kidney disease: Secondary | ICD-10-CM | POA: Diagnosis not present

## 2016-08-26 DIAGNOSIS — I129 Hypertensive chronic kidney disease with stage 1 through stage 4 chronic kidney disease, or unspecified chronic kidney disease: Secondary | ICD-10-CM | POA: Insufficient documentation

## 2016-08-26 DIAGNOSIS — Z7982 Long term (current) use of aspirin: Secondary | ICD-10-CM | POA: Diagnosis not present

## 2016-08-26 DIAGNOSIS — R2243 Localized swelling, mass and lump, lower limb, bilateral: Secondary | ICD-10-CM | POA: Diagnosis present

## 2016-08-26 DIAGNOSIS — Z87891 Personal history of nicotine dependence: Secondary | ICD-10-CM | POA: Diagnosis not present

## 2016-08-26 DIAGNOSIS — R609 Edema, unspecified: Secondary | ICD-10-CM

## 2016-08-26 LAB — CBC WITH DIFFERENTIAL/PLATELET
Basophils Absolute: 0 10*3/uL (ref 0.0–0.1)
Basophils Relative: 0 %
Eosinophils Absolute: 0.1 10*3/uL (ref 0.0–0.7)
Eosinophils Relative: 3 %
HEMATOCRIT: 33.4 % — AB (ref 36.0–46.0)
HEMOGLOBIN: 10.8 g/dL — AB (ref 12.0–15.0)
LYMPHS ABS: 1.3 10*3/uL (ref 0.7–4.0)
Lymphocytes Relative: 32 %
MCH: 30.2 pg (ref 26.0–34.0)
MCHC: 32.3 g/dL (ref 30.0–36.0)
MCV: 93.3 fL (ref 78.0–100.0)
MONO ABS: 0.4 10*3/uL (ref 0.1–1.0)
Monocytes Relative: 10 %
NEUTROS ABS: 2.1 10*3/uL (ref 1.7–7.7)
NEUTROS PCT: 55 %
Platelets: 181 10*3/uL (ref 150–400)
RBC: 3.58 MIL/uL — ABNORMAL LOW (ref 3.87–5.11)
RDW: 14.3 % (ref 11.5–15.5)
WBC: 3.9 10*3/uL — ABNORMAL LOW (ref 4.0–10.5)

## 2016-08-26 LAB — BASIC METABOLIC PANEL
Anion gap: 8 (ref 5–15)
BUN: 23 mg/dL — AB (ref 6–20)
CHLORIDE: 108 mmol/L (ref 101–111)
CO2: 26 mmol/L (ref 22–32)
CREATININE: 1.9 mg/dL — AB (ref 0.44–1.00)
Calcium: 8.5 mg/dL — ABNORMAL LOW (ref 8.9–10.3)
GFR calc Af Amer: 26 mL/min — ABNORMAL LOW (ref >60)
GFR calc non Af Amer: 23 mL/min — ABNORMAL LOW (ref >60)
GLUCOSE: 115 mg/dL — AB (ref 65–99)
Potassium: 4.3 mmol/L (ref 3.5–5.1)
Sodium: 142 mmol/L (ref 135–145)

## 2016-08-26 MED ORDER — DOXYCYCLINE HYCLATE 100 MG PO CAPS: 100.0000 mg | ORAL_CAPSULE | Freq: Two times a day (BID) | ORAL | 0 refills | Status: AC

## 2016-08-26 MED ORDER — DOXYCYCLINE HYCLATE 100 MG PO TABS
100.0000 mg | ORAL_TABLET | Freq: Once | ORAL | Status: AC
  Administered 2016-08-26: 100 mg via ORAL
  Filled 2016-08-26: qty 1

## 2016-08-26 NOTE — ED Provider Notes (Signed)
[facility]-EMERGENCY DEPT Provider Note   CSN: [id number] Arrival date & time: 08/26/16  1455     History   Chief Complaint Chief Complaint  Patient presents with  . Leg Swelling    HPI ALLICE Walters is a 83 y.o. female.  The history is provided by the patient.   CC: bilateral   Onset/Duration: 2 weeks Timing: Constant/worsening Location: Bilateral lower extremity. Right greater than left Quality: Swelling Severity: Moderate Modifying Factors:  Improved by: Nothing.  Worsened by: Nothing noted Associated Signs/Symptoms:  Pertinent (+): Blistering, pain, redness to the right lower leg  Pertinent (-): Fevers, chills, chest pain, shortness of breath, nausea, vomiting, abdominal pain. Context: Patient was referred by his primary care provider and was seen yesterday by a vein specialist who performed the ultrasound ruling out DVT.  Past Medical History:  Diagnosis Date  . Anemia   . Carotid artery occlusion   . Carotid bruit   . Chronic kidney disease (CKD), stage III (moderate)   . Coronary artery disease    Remote PCI. Had BMS to RCA and DES stent to Diagonal in Jan. 2012   . Diastolic heart failure    EF 55 to 60% per cath in Jan 2012  . DVT (deep venous thrombosis) (HCC)   . Gout   . Hyperlipidemia   . Hypertension   . Ischemic cardiomyopathy    with prior EF of 35%  . NSTEMI (non-ST elevated myocardial infarction) [facility]) Jan 2012   with PCI to RCA and DX per Dr. Excell Seltzer  . Obesity   . Pneumonia 2016  . Proteinuria   . Stroke [facility]) 2013   ?  mini    . TIA (transient ischemic attack)   . Type II diabetes mellitus (HCC)    long standing    Patient Active Problem List   Diagnosis Date Noted  . Hemorrhoids 05/04/2016  . Pain in joint, lower leg 01/20/2016  . Ptosis 11/04/2015  . Glaucoma 08/25/2015  . Dementia 06/10/2015  . Expressive aphasia 05/26/2015  . Anal prolapse 02/25/2015  . Chronic kidney disease, stage III (moderate) 02/10/2014  . PAD (peripheral  artery disease) (HCC) 01/17/2014  . Constipation 07/31/2013  . Atherosclerosis of native artery of extremity with intermittent claudication (HCC) 07/04/2013  . Anxiety disorder 02/21/2013  . Diabetes mellitus with nephropathy (HCC) 07/05/2012  . Occlusion and stenosis of carotid artery without mention of cerebral infarction 06/21/2012  . Coronary artery disease   . Chronic diastolic heart failure, NYHA class 1 (HCC)   . Hyperlipidemia   . Hypertension   . Carotid bruit   . Gout   . Obesity   . TIA (transient ischemic attack)     Past Surgical History:  Procedure Laterality Date  . ABDOMINAL AORTAGRAM N/A 01/17/2014   Procedure: ABDOMINAL Ronny Flurry;  Surgeon: Sherren Kerns, MD;  Location: [facility] CATH LAB;  Service: Cardiovascular;  Laterality: N/A;  . ANKLE FUSION Bilateral   . APPENDECTOMY    . CATARACT EXTRACTION, BILATERAL Bilateral   . CORONARY ANGIOPLASTY WITH STENT PLACEMENT  04/05/2010   distal RCA  . FRACTURE SURGERY Right 2005   bilateral ankles   . TONSILLECTOMY      OB History    No data available       Home Medications    Prior to Admission medications   Medication Sig Start Date End Date Taking? Authorizing Provider  aspirin EC 81 MG tablet Take 81 mg by mouth daily.   Yes [provider]  BETIMOL 0.5 % ophthalmic solution INSTILL 1 DROP INTO THE LEFT EYE DAILY 04/19/16  Yes Kimber Relic, MD  cloNIDine (CATAPRES) 0.1 MG tablet Take 1 tablet (0.1 mg total) by mouth daily. For high blood pressure 08/24/16  Yes Montez Morita, Monica, DO  docusate sodium (COLACE) 100 MG capsule Take 1 capsule (100 mg total) by mouth 2 (two) times daily as needed for mild constipation. 08/24/16  Yes Montez Morita, Monica, DO  ezetimibe-simvastatin (VYTORIN) 10-20 MG tablet Take 1/2 tablet by mouth once nightly to control cholesterol 08/24/16  Yes Kirt Boys, DO  furosemide (LASIX) 20 MG tablet Take 1 tablet (20 mg total) by mouth 2 (two) times daily. For Hypertension 08/24/16  Yes Montez Morita,  Monica, DO  losartan (COZAAR) 50 MG tablet Take 1 tablet (50 mg total) by mouth daily. for high blood pressure 08/24/16  Yes Goodchild, Artemus, DO  LOTEMAX 0.5 % GEL PLACE 1 DROP IN THE LEFT EYE TWICE DAILY 05/17/16  Yes Kimber Relic, MD  meloxicam (MOBIC) 15 MG tablet Take 1 tablet (15 mg total) by mouth as needed for pain. For foot and ankle pain 08/24/16  Yes Kirt Boys, DO  metFORMIN (GLUCOPHAGE) 500 MG tablet Take One tablet by mouth twice daily to control glucose 08/24/16  Yes Kirt Boys, DO  Nebivolol HCl (BYSTOLIC) 20 MG TABS Take 1 tablet (20 mg total) by mouth daily. For Blood Pressure 08/24/16  Yes Kirt Boys, DO  nitroGLYCERIN (NITROSTAT) 0.4 MG SL tablet Place one under tongue for chest pain, up to 3 tablets 10/22/15  Yes [provider]  polyethylene glycol powder (GLYCOLAX/MIRALAX) powder DISSOLVE 17 GRAMS (1 CAPFUL) INTO 8 OUNCES OF LIQUID AND DRINK EVERY DAY AS NEEDED FOR BOWELS 08/24/16  Yes Montez Morita, Monica, DO  sitaGLIPtin (JANUVIA) 100 MG tablet Take 1 tablet (100 mg total) by mouth daily. For Diabetes 08/24/16  Yes Montez Morita, Monica, DO  cephALEXin (KEFLEX) 500 MG capsule Take 1 capsule (500 mg total) by mouth 3 (three) times daily. Patient not taking: Reported on 08/26/2016 08/01/16   Azalia Bilis, MD  doxycycline (VIBRAMYCIN) 100 MG capsule Take 1 capsule (100 mg total) by mouth 2 (two) times daily. 08/26/16 09/02/16  Nira Conn, MD    Family History No family history on file.  Social History Social History  Substance Use Topics  . Smoking status: Former Games developer  . Smokeless tobacco: Never Used     Comment: "smoked in my teens; quit after 1 year or so"  . Alcohol use No     Allergies   Namzaric [memantine hcl-donepezil hcl]; Percocet [oxycodone-acetaminophen]; Sulfa antibiotics; Adhesive [tape]; Metformin and related; Tradjenta [linagliptin]; and Penicillins   Review of Systems Review of Systems  All other systems are reviewed and are negative for  acute change except as noted in the HPI  Physical Exam Updated Vital Signs BP (!) 156/52 (BP Location: Right Arm)   Pulse 65   Temp 98.6 F (37 C) (Oral)   Resp 16   Ht 5' (1.524 m)   Wt 62.1 kg (137 lb)   SpO2 93%   BMI 26.76 kg/m   Physical Exam  Constitutional: She is oriented to person, place, and time. She appears well-developed and well-nourished. No distress.  HENT:  Head: Normocephalic and atraumatic.  Nose: Nose normal.  Eyes: Conjunctivae and EOM are normal. Pupils are equal, round, and reactive to light. Right eye exhibits no discharge. Left eye exhibits no discharge. No scleral icterus.  Neck: Normal range of motion. Neck supple.  Cardiovascular: Normal rate and regular rhythm.  Exam reveals no gallop and no friction rub.   No murmur heard. Pulmonary/Chest: Effort normal and breath sounds normal. No stridor. No respiratory distress. She has no wheezes. She has no rhonchi. She has no rales.  Abdominal: Soft. She exhibits no distension. There is no tenderness.  Musculoskeletal: She exhibits no edema or tenderness.  2+ RLE edema with few dispersed clear blisters; one large blister ruptured with surrounding erythema that is streaking up the leg (see image). 1+ LLE edema  Neurological: She is alert and oriented to person, place, and time.  Skin: Skin is warm and dry. No rash noted. She is not diaphoretic. No erythema.  Psychiatric: She has a normal mood and affect.  Vitals reviewed.        ED Treatments / Results  Labs (all labs ordered are listed, but only abnormal results are displayed) Labs Reviewed  CBC WITH DIFFERENTIAL/PLATELET - Abnormal; Notable for the following:       Result Value   WBC 3.9 (*)    RBC 3.58 (*)    Hemoglobin 10.8 (*)    HCT 33.4 (*)    All other components within normal limits  BASIC METABOLIC PANEL - Abnormal; Notable for the following:    Glucose, Bld 115 (*)    BUN 23 (*)    Creatinine, Ser 1.90 (*)    Calcium 8.5 (*)    GFR  calc non Af Amer 23 (*)    GFR calc Af Amer 26 (*)    All other components within normal limits    EKG  EKG Interpretation None       Radiology No results found.  Procedures Procedures (including critical care time)  Medications Ordered in ED Medications  doxycycline (VIBRA-TABS) tablet 100 mg (not administered)     Initial Impression / Assessment and Plan / ED Course  I have reviewed the triage vital signs and the nursing notes.  Pertinent labs & imaging results that were available during my care of the patient were reviewed by me and considered in my medical decision making (see chart for details).  Clinical Course as of Aug 27 1702  Fri Aug 26, 2016  1656 Presentation consistent with early-onset cellulitis likely secondary to breaking the skin from the water blister which is secondary to her peripheral edema which is likely related to venous insufficiency. Patient had a negative ultrasound yesterday at the pain clinic for DVTs. To not feel that repeating this would be beneficial. Low suspicion for necrotizing fasciitis. Patient's labs at patient's baseline and grossly reassuring.  Will start patient on oral antibiotic and treat outpatient. We'll recommend close follow-up with her primary care provider.  The patient is safe for discharge with strict return precautions.   [PC]    Clinical Course User Index [PC] Ryne Mctigue, Amadeo Garnet, MD      Final Clinical Impressions(s) / ED Diagnoses   Final diagnoses:  Cellulitis of right lower extremity  Peripheral edema   Disposition: Discharge  Condition: Good  I have discussed the results, Dx and Tx plan with the patient who expressed understanding and agree(s) with the plan. Discharge instructions discussed at great length. The patient was given strict return precautions who verbalized understanding of the instructions. No further questions at time of discharge.    New Prescriptions   DOXYCYCLINE (VIBRAMYCIN) 100 MG  CAPSULE    Take 1 capsule (100 mg total) by mouth 2 (two) times daily.    Follow Up: Chilton Si,  Lenon CurtArthur G, MD [address] [place] [id number] [phone]   in 3-5 days for close follow up to assess for skin infection and leg swelling      Josseline Reddin, Amadeo GarnetPedro Eduardo, MD 08/26/16 1704

## 2016-08-26 NOTE — ED Notes (Signed)
Pharmacy tech at bedside 

## 2016-08-26 NOTE — ED Triage Notes (Signed)
Patient c/o right leg swelling x 1 week that has gotten progressively worse. Patient states she went to the Vein specialist yesterday and was negative for a DVT.

## 2016-08-29 ENCOUNTER — Other Ambulatory Visit: Payer: Medicare Other

## 2016-08-29 DIAGNOSIS — E785 Hyperlipidemia, unspecified: Secondary | ICD-10-CM

## 2016-08-29 DIAGNOSIS — I10 Essential (primary) hypertension: Secondary | ICD-10-CM

## 2016-08-29 DIAGNOSIS — E1321 Other specified diabetes mellitus with diabetic nephropathy: Secondary | ICD-10-CM

## 2016-08-29 LAB — COMPLETE METABOLIC PANEL WITH GFR
ALT: 5 U/L — ABNORMAL LOW (ref 6–29)
AST: 12 U/L (ref 10–35)
Albumin: 3.4 g/dL — ABNORMAL LOW (ref 3.6–5.1)
Alkaline Phosphatase: 52 U/L (ref 33–130)
BUN: 43 mg/dL — AB (ref 7–25)
CALCIUM: 8.9 mg/dL (ref 8.6–10.4)
CHLORIDE: 108 mmol/L (ref 98–110)
CO2: 25 mmol/L (ref 20–31)
Creat: 2.01 mg/dL — ABNORMAL HIGH (ref 0.60–0.88)
GFR, Est African American: 25 mL/min — ABNORMAL LOW (ref >=60)
GFR, Est Non African American: 22 mL/min — ABNORMAL LOW (ref >=60)
GLUCOSE: 107 mg/dL — AB (ref 65–99)
POTASSIUM: 4.9 mmol/L (ref 3.5–5.3)
SODIUM: 145 mmol/L (ref 135–146)
Total Bilirubin: 0.4 mg/dL (ref 0.2–1.2)
Total Protein: 6 g/dL — ABNORMAL LOW (ref 6.1–8.1)

## 2016-08-29 LAB — LIPID PANEL
CHOLESTEROL: 130 mg/dL (ref <200)
HDL: 39 mg/dL — ABNORMAL LOW (ref >50)
LDL Cholesterol: 52 mg/dL (ref <100)
Total CHOL/HDL Ratio: 3.3 Ratio (ref <5.0)
Triglycerides: 196 mg/dL — ABNORMAL HIGH (ref <150)
VLDL: 39 mg/dL — ABNORMAL HIGH (ref <30)

## 2016-08-30 ENCOUNTER — Encounter: Payer: Self-pay | Admitting: Nurse Practitioner

## 2016-08-30 ENCOUNTER — Ambulatory Visit (INDEPENDENT_AMBULATORY_CARE_PROVIDER_SITE_OTHER): Payer: Medicare Other | Admitting: Nurse Practitioner

## 2016-08-30 VITALS — BP 120/82 | HR 60 | Temp 97.4°F | Ht 60.0 in

## 2016-08-30 DIAGNOSIS — F0390 Unspecified dementia without behavioral disturbance: Secondary | ICD-10-CM | POA: Diagnosis not present

## 2016-08-30 DIAGNOSIS — I872 Venous insufficiency (chronic) (peripheral): Secondary | ICD-10-CM

## 2016-08-30 DIAGNOSIS — L03119 Cellulitis of unspecified part of limb: Secondary | ICD-10-CM

## 2016-08-30 DIAGNOSIS — K6289 Other specified diseases of anus and rectum: Secondary | ICD-10-CM

## 2016-08-30 LAB — HEMOGLOBIN A1C
Hgb A1c MFr Bld: 7.1 % — ABNORMAL HIGH (ref <5.7)
MEAN PLASMA GLUCOSE: 157 mg/dL

## 2016-08-30 NOTE — Patient Instructions (Signed)
Make sure to complete entire course of antibiotics Follow up sooner if symptoms worsen or fail to improve

## 2016-08-30 NOTE — Progress Notes (Addendum)
Careteam: Patient Care Team: Kimber Relic, MD as PCP - General (Internal Medicine) Rosalio Macadamia, NP as Nurse Practitioner (Cardiology) Sherrie George, MD as Consulting Physician (Ophthalmology) Kermit Balo, DO as Consulting Physician (Geriatric Medicine) Sherren Kerns, MD as Consulting Physician (Vascular Surgery) Bernette Redbird, MD as Consulting Physician (Gastroenterology) Tonny Bollman, MD as Consulting Physician (Cardiology) Karie Soda, MD as Consulting Physician (General Surgery)  Advanced Directive information    Allergies  Allergen Reactions  . Namzaric [Memantine Hcl-Donepezil Hcl] Nausea Only    confusion  . Percocet [Oxycodone-Acetaminophen] Other (See Comments)    loosy goosy  . Sulfa Antibiotics Other (See Comments)    Tongue swells  . Adhesive [Tape] Other (See Comments)    Tears skin.  Please use "paper" tape  . Metformin And Related Other (See Comments)    Abnormal Kidney Functions   . Tradjenta [Linagliptin]     Low back ache, resolved once medication stopped   . Penicillins Hives    Tolerates Ceftriaxone, Has patient had a PCN reaction causing immediate rash, facial/tongue/throat swelling, SOB or lightheadedness with hypotension: Unknown Has patient had a PCN reaction causing severe rash involving mucus membranes or skin necrosis: No Has patient had a PCN reaction that required hospitalization No Has patient had a PCN reaction occurring within the last 10 years: No If all of the above answers are "NO", then may proceed with Cephalosporin use.     Chief Complaint  Patient presents with  . Acute Visit    right lower  leg pain and water blisters, went to ED 6/8 for this.   . Rectal Pain    no BM in 2 days. Patient is crying in pain for rectal pain.  Patient will not let her daughter stay in the room with her, " she doesn't help me"      HPI: Patient is a 83 y.o. female seen in the office today due to right lower extremity pain  and blister. Went to ED on 08/26/16 due to swelling, pain and blisters. She was started on doxycyline 100 mg BID for early onset cellulitis due to worsening peripheral edema with blisters.   Pt with increase rectal pain due to prolapse  and no BM in 2 days. Reports she had an impaction and got it out while she was in the bathroom.   Staff notes when they assisted pt to the bathroom her brief was full of stool   Reports cats got on her table when she got her refill last and knocked over all of her medication-- this was "weeks ago". She has not had any of her medication since.  Reports some of her medication was refilled but not all of them. Still missing some.  States she does not know exactly when this happen. Does not know what day, month or year it is.  Does not know name of building.  Review of Systems:  Review of Systems  Constitutional: Negative for chills and fever.  HENT: Positive for hearing loss.   Respiratory: Negative for cough and shortness of breath.   Cardiovascular: Positive for leg swelling. Negative for chest pain, palpitations, orthopnea and PND.  Gastrointestinal: Positive for constipation. Negative for abdominal pain.  Genitourinary: Negative for dysuria.  Musculoskeletal: Positive for myalgias.  Endo/Heme/Allergies: Bruises/bleeds easily.  Psychiatric/Behavioral: Positive for memory loss.   Past Medical History:  Diagnosis Date  . Anemia   . Carotid artery occlusion   . Carotid bruit   . Chronic kidney  disease (CKD), stage III (moderate)   . Coronary artery disease    Remote PCI. Had BMS to RCA and DES stent to Diagonal in Jan. 2012   . Diastolic heart failure    EF 55 to 60% per cath in Jan 2012  . DVT (deep venous thrombosis) (HCC)   . Gout   . Hyperlipidemia   . Hypertension   . Ischemic cardiomyopathy    with prior EF of 35%  . NSTEMI (non-ST elevated myocardial infarction) [facility]) Jan 2012   with PCI to RCA and DX per Dr. Excell Seltzer  . Obesity   . Pneumonia  2016  . Proteinuria   . Stroke [facility]) 2013   ?  mini    . TIA (transient ischemic attack)   . Type II diabetes mellitus (HCC)    long standing   Past Surgical History:  Procedure Laterality Date  . ABDOMINAL AORTAGRAM N/A 01/17/2014   Procedure: ABDOMINAL Ronny Flurry;  Surgeon: Sherren Kerns, MD;  Location: [facility] CATH LAB;  Service: Cardiovascular;  Laterality: N/A;  . ANKLE FUSION Bilateral   . APPENDECTOMY    . CATARACT EXTRACTION, BILATERAL Bilateral   . CORONARY ANGIOPLASTY WITH STENT PLACEMENT  04/05/2010   distal RCA  . FRACTURE SURGERY Right 2005   bilateral ankles   . TONSILLECTOMY     Social History:   reports that she has quit smoking. She has never used smokeless tobacco. She reports that she does not drink alcohol or use drugs.  No family history on file.  Medications: Patient's Medications  New Prescriptions   No medications on file  Previous Medications   ASPIRIN EC 81 MG TABLET    Take 81 mg by mouth daily.   BETIMOL 0.5 % OPHTHALMIC SOLUTION    INSTILL 1 DROP INTO THE LEFT EYE DAILY   CEPHALEXIN (KEFLEX) 500 MG CAPSULE    Take 1 capsule (500 mg total) by mouth 3 (three) times daily.   CLONIDINE (CATAPRES) 0.1 MG TABLET    Take 1 tablet (0.1 mg total) by mouth daily. For high blood pressure   DOCUSATE SODIUM (COLACE) 100 MG CAPSULE    Take 1 capsule (100 mg total) by mouth 2 (two) times daily as needed for mild constipation.   DOXYCYCLINE (VIBRAMYCIN) 100 MG CAPSULE    Take 1 capsule (100 mg total) by mouth 2 (two) times daily.   EZETIMIBE-SIMVASTATIN (VYTORIN) 10-20 MG TABLET    Take 1/2 tablet by mouth once nightly to control cholesterol   FUROSEMIDE (LASIX) 20 MG TABLET    Take 1 tablet (20 mg total) by mouth 2 (two) times daily. For Hypertension   LOSARTAN (COZAAR) 50 MG TABLET    Take 1 tablet (50 mg total) by mouth daily. for high blood pressure   LOTEMAX 0.5 % GEL    PLACE 1 DROP IN THE LEFT EYE TWICE DAILY   MELOXICAM (MOBIC) 15 MG TABLET    Take 1 tablet  (15 mg total) by mouth as needed for pain. For foot and ankle pain   METFORMIN (GLUCOPHAGE) 500 MG TABLET    Take One tablet by mouth twice daily to control glucose   NEBIVOLOL HCL (BYSTOLIC) 20 MG TABS    Take 1 tablet (20 mg total) by mouth daily. For Blood Pressure   NITROGLYCERIN (NITROSTAT) 0.4 MG SL TABLET    Place one under tongue for chest pain, up to 3 tablets   POLYETHYLENE GLYCOL POWDER (GLYCOLAX/MIRALAX) POWDER    DISSOLVE 17 GRAMS (1 CAPFUL)  INTO 8 OUNCES OF LIQUID AND DRINK EVERY DAY AS NEEDED FOR BOWELS   SITAGLIPTIN (JANUVIA) 100 MG TABLET    Take 1 tablet (100 mg total) by mouth daily. For Diabetes  Modified Medications   No medications on file  Discontinued Medications   No medications on file     Physical Exam:  Vitals:   08/30/16 1504  BP: 120/82  Pulse: 60  Temp: 97.4 F (36.3 C)  TempSrc: Oral  SpO2: 96%  Height: 5' (1.524 m)   There is no height or weight on file to calculate BMI.  Physical Exam  Constitutional: She appears well-developed and well-nourished.  HENT:  Mouth/Throat: Oropharynx is clear and moist. No oropharyngeal exudate.  MMM; no oral thrush  Eyes: Pupils are equal, round, and reactive to light. No scleral icterus.  Neck: Neck supple. Carotid bruit is present. No tracheal deviation present.  Cardiovascular: Normal rate, regular rhythm and intact distal pulses.  Exam reveals no gallop and no friction rub.   Murmur (1/6 SEM) heard. Chronic venous stasis changes in LE b/l. riht medial calf TTP with palpable cord. +1 pitting LE edema b/l.  Pulmonary/Chest: Effort normal and breath sounds normal. No stridor. No respiratory distress. She has no wheezes. She has no rales.  Abdominal: Soft. Bowel sounds are normal. There is no hepatomegaly. There is tenderness (reports she needs to urinate, no dysuria ).  Genitourinary:  Genitourinary Comments: No stool impaction on rectal exam  Musculoskeletal: She exhibits edema (1+ bilaterally) and tenderness.   Lymphadenopathy:    She has no cervical adenopathy.  Neurological: She is alert. She has normal reflexes.  Skin: Skin is warm and dry. No rash noted.  Quarter sized open area from where blister was, hair and lint in area. Area cleaned with NS and mupirocin ointment applied with Tegaderm   Psychiatric: She has a normal mood and affect.    Labs reviewed: Basic Metabolic Panel:  Recent Labs  16/10/96 0304 08/26/16 1623 08/29/16 1051  NA 141 142 145  K 4.4 4.3 4.9  CL 108 108 108  CO2 23 26 25   GLUCOSE 121* 115* 107*  BUN 54* 23* 43*  CREATININE 2.26* 1.90* 2.01*  CALCIUM 8.5* 8.5* 8.9   Liver Function Tests:  Recent Labs  04/20/16 1351 04/29/16 1407 08/29/16 1051  AST 15 21 12   ALT 12 13* 5*  ALKPHOS 67 65 52  BILITOT 0.4 0.4 0.4  PROT 5.8* 6.4* 6.0*  ALBUMIN 3.6 3.8 3.4*    Recent Labs  04/29/16 1407  LIPASE 33   No results for input(s): AMMONIA in the last 8760 hours. CBC:  Recent Labs  04/27/16 1003 04/29/16 1407 08/01/16 0304 08/26/16 1623  WBC 3.2* 5.1 3.9* 3.9*  NEUTROABS 1,152* 2.9  --  2.1  HGB 10.6* 11.4* 13.8 10.8*  HCT 33.6* 35.4* 41.6 33.4*  MCV 94.6 93.9 93.5 93.3  PLT 149 165 146* 181   Lipid Panel:  Recent Labs  04/20/16 1351 08/29/16 1051  CHOL 148 130  HDL 42* 39*  LDLCALC 71 52  TRIG 173* 196*  CHOLHDL 3.5 3.3   TSH: No results for input(s): TSH in the last 8760 hours. A1C: Lab Results  Component Value Date   HGBA1C 7.1 (H) 08/29/2016     Assessment/Plan 1. Cellulitis of lower extremity, unspecified laterality Has improved, stressed importance of completing antibiotics. To notify if symptoms worsen or fail to improve. Open area on leg covered with Tegaderm, to keep area clean as it had  cat hair and dander in wound. Bactroban applied   2. Chronic venous insufficiency Stable, encouraged compliance with medications, low sodium diet and elevation   3. Rectum pain Due to prolapse and constipation. Improved after BM  in office  4. Dementia without behavioral disturbance, unspecified dementia type Pt does have insight that her memory is worsening. Reports she has a system in place at home to help her with her medications. Has a man that does live with her at home.   Janene HarveyJessica K. Biagio BorgEubanks, AGNP  [facility] [phone](Monday-Friday 8 am - 5 pm) [phone] (after hours)

## 2016-08-31 ENCOUNTER — Encounter (HOSPITAL_COMMUNITY): Payer: Self-pay

## 2016-08-31 ENCOUNTER — Emergency Department (HOSPITAL_COMMUNITY): Payer: Medicare Other

## 2016-08-31 ENCOUNTER — Emergency Department (HOSPITAL_COMMUNITY)
Admission: EM | Admit: 2016-08-31 | Discharge: 2016-08-31 | Disposition: A | Discharge status: Home / Self Care | Payer: Medicare Other | Attending: Emergency Medicine | Admitting: Emergency Medicine

## 2016-08-31 DIAGNOSIS — R55 Syncope and collapse: Secondary | ICD-10-CM | POA: Insufficient documentation

## 2016-08-31 DIAGNOSIS — I2510 Atherosclerotic heart disease of native coronary artery without angina pectoris: Secondary | ICD-10-CM | POA: Insufficient documentation

## 2016-08-31 DIAGNOSIS — Z79899 Other long term (current) drug therapy: Secondary | ICD-10-CM | POA: Diagnosis not present

## 2016-08-31 DIAGNOSIS — Z7982 Long term (current) use of aspirin: Secondary | ICD-10-CM | POA: Insufficient documentation

## 2016-08-31 DIAGNOSIS — Z87891 Personal history of nicotine dependence: Secondary | ICD-10-CM | POA: Diagnosis not present

## 2016-08-31 DIAGNOSIS — Z7984 Long term (current) use of oral hypoglycemic drugs: Secondary | ICD-10-CM | POA: Diagnosis not present

## 2016-08-31 DIAGNOSIS — E1122 Type 2 diabetes mellitus with diabetic chronic kidney disease: Secondary | ICD-10-CM | POA: Diagnosis not present

## 2016-08-31 DIAGNOSIS — I130 Hypertensive heart and chronic kidney disease with heart failure and stage 1 through stage 4 chronic kidney disease, or unspecified chronic kidney disease: Secondary | ICD-10-CM | POA: Diagnosis not present

## 2016-08-31 DIAGNOSIS — Z8673 Personal history of transient ischemic attack (TIA), and cerebral infarction without residual deficits: Secondary | ICD-10-CM | POA: Diagnosis not present

## 2016-08-31 DIAGNOSIS — I5031 Acute diastolic (congestive) heart failure: Secondary | ICD-10-CM | POA: Insufficient documentation

## 2016-08-31 DIAGNOSIS — N183 Chronic kidney disease, stage 3 (moderate): Secondary | ICD-10-CM | POA: Diagnosis not present

## 2016-08-31 LAB — URINALYSIS, ROUTINE W REFLEX MICROSCOPIC
Bacteria, UA: NONE SEEN
Bilirubin Urine: NEGATIVE
Glucose, UA: NEGATIVE mg/dL
Hgb urine dipstick: NEGATIVE
KETONES UR: NEGATIVE mg/dL
Leukocytes, UA: NEGATIVE
Nitrite: NEGATIVE
PROTEIN: 30 mg/dL — AB
Specific Gravity, Urine: 1.005 (ref 1.005–1.030)
pH: 5 (ref 5.0–8.0)

## 2016-08-31 LAB — BASIC METABOLIC PANEL
ANION GAP: 12 (ref 5–15)
BUN: 44 mg/dL — ABNORMAL HIGH (ref 6–20)
CHLORIDE: 108 mmol/L (ref 101–111)
CO2: 25 mmol/L (ref 22–32)
CREATININE: 2.01 mg/dL — AB (ref 0.44–1.00)
Calcium: 9.2 mg/dL (ref 8.9–10.3)
GFR calc non Af Amer: 21 mL/min — ABNORMAL LOW (ref >60)
GFR, EST AFRICAN AMERICAN: 25 mL/min — AB (ref >60)
Glucose, Bld: 114 mg/dL — ABNORMAL HIGH (ref 65–99)
Potassium: 4 mmol/L (ref 3.5–5.1)
Sodium: 145 mmol/L (ref 135–145)

## 2016-08-31 LAB — CBC
HCT: 38 % (ref 36.0–46.0)
Hemoglobin: 12.6 g/dL (ref 12.0–15.0)
MCH: 30.3 pg (ref 26.0–34.0)
MCHC: 33.2 g/dL (ref 30.0–36.0)
MCV: 91.3 fL (ref 78.0–100.0)
Platelets: 226 10*3/uL (ref 150–400)
RBC: 4.16 MIL/uL (ref 3.87–5.11)
RDW: 14.1 % (ref 11.5–15.5)
WBC: 6.2 10*3/uL (ref 4.0–10.5)

## 2016-08-31 LAB — CBG MONITORING, ED: Glucose-Capillary: 114 mg/dL — ABNORMAL HIGH (ref 65–99)

## 2016-08-31 MED ORDER — SODIUM CHLORIDE 0.9 % IV SOLN
INTRAVENOUS | Status: DC
  Administered 2016-08-31: 17:00:00 via INTRAVENOUS

## 2016-08-31 MED ORDER — SODIUM CHLORIDE 0.9 % IV BOLUS (SEPSIS)
500.0000 mL | Freq: Once | INTRAVENOUS | Status: AC
  Administered 2016-08-31: 500 mL via INTRAVENOUS

## 2016-08-31 NOTE — ED Triage Notes (Signed)
She c/o known rectal prolapse, for which she has seen more than one physician. Surgical repair was discussed, however, her physicians felt that it was "too risky to do surgery". She came here today d/t rectal pain exacerbated by passing a large, solid b.m. Yesterday. Her family report that pt. "passed out" in their vehicle en route to hospital. As I write this, she is alert and oriented and in no distress. EKG/labs drawn.

## 2016-08-31 NOTE — ED Notes (Signed)
Pt is not able to sit up long enough to collect orthostatic vitals. Pt states that she is very weak and cannot move.

## 2016-08-31 NOTE — ED Notes (Signed)
Pt to weak to get orthostatics at this time

## 2016-08-31 NOTE — ED Provider Notes (Signed)
[facility]-EMERGENCY DEPT Provider Note   CSN: [id number] Arrival date & time: 08/31/16  1421     History   Chief Complaint Chief Complaint  Patient presents with  . Loss of Consciousness  . Rectal Pain    HPI [name] is a 83 y.o. female.  83 year old female here complaining of persistent rectal pain due to rectal prolapse. Patient has long-standing history of rectal prolapse and is not a surgical candidate. Pain became worse after she had a large BM. Denies any rectal bleeding. Some abdominal cramping without emesis or fever. While coming to the hospital today, patient felt dizzy lightheaded but denies any syncope. States that the time she was having severe pain. No associated chest pain chest pressure. Denies any recent medication changes. No treatment use prior to arrival.      Past Medical History:  Diagnosis Date  . Anemia   . Carotid artery occlusion   . Carotid bruit   . Chronic kidney disease (CKD), stage III (moderate)   . Coronary artery disease    Remote PCI. Had BMS to RCA and DES stent to Diagonal in Jan. 2012   . Diastolic heart failure    EF 55 to 60% per cath in Jan 2012  . DVT (deep venous thrombosis) (HCC)   . Gout   . Hyperlipidemia   . Hypertension   . Ischemic cardiomyopathy    with prior EF of 35%  . NSTEMI (non-ST elevated myocardial infarction) [facility](HCC) Jan 2012   with PCI to RCA and DX per Dr. Excell Seltzerooper  . Obesity   . Pneumonia 2016  . Proteinuria   . Stroke [facility](HCC) 2013   ?  mini    . TIA (transient ischemic attack)   . Type II diabetes mellitus (HCC)    long standing    Patient Active Problem List   Diagnosis Date Noted  . Hemorrhoids 05/04/2016  . Pain in joint, lower leg 01/20/2016  . Ptosis 11/04/2015  . Glaucoma 08/25/2015  . Dementia 06/10/2015  . Expressive aphasia 05/26/2015  . Anal prolapse 02/25/2015  . Chronic kidney disease, stage III (moderate) 02/10/2014  . PAD (peripheral artery disease) (HCC) 01/17/2014  . Constipation  07/31/2013  . Atherosclerosis of native artery of extremity with intermittent claudication (HCC) 07/04/2013  . Anxiety disorder 02/21/2013  . Diabetes mellitus with nephropathy (HCC) 07/05/2012  . Occlusion and stenosis of carotid artery without mention of cerebral infarction 06/21/2012  . Coronary artery disease   . Chronic diastolic heart failure, NYHA class 1 (HCC)   . Hyperlipidemia   . Hypertension   . Carotid bruit   . Gout   . Obesity   . TIA (transient ischemic attack)     Past Surgical History:  Procedure Laterality Date  . ABDOMINAL AORTAGRAM N/A 01/17/2014   Procedure: ABDOMINAL Ronny FlurryAORTAGRAM;  Surgeon: Sherren Kernsharles E Fields, MD;  [facility];  Service: Cardiovascular;  Laterality: N/A;  . ANKLE FUSION Bilateral   . APPENDECTOMY    . CATARACT EXTRACTION, BILATERAL Bilateral   . CORONARY ANGIOPLASTY WITH STENT PLACEMENT  04/05/2010   distal RCA  . FRACTURE SURGERY Right 2005   bilateral ankles   . TONSILLECTOMY      OB History    No data available       Home Medications    Prior to Admission medications   Medication Sig Start Date End Date Taking? Authorizing Provider  aspirin EC 81 MG tablet Take 81 mg by mouth daily.   Yes [provider]  BETIMOL 0.5 % ophthalmic solution INSTILL 1 DROP INTO THE LEFT EYE DAILY 04/19/16  Yes Kimber Relic, MD  cloNIDine (CATAPRES) 0.1 MG tablet Take 1 tablet (0.1 mg total) by mouth daily. For high blood pressure 08/24/16  Yes Montez Morita, Monica, DO  docusate sodium (COLACE) 100 MG capsule Take 1 capsule (100 mg total) by mouth 2 (two) times daily as needed for mild constipation. 08/24/16  Yes Kirt Boys, DO  doxycycline (VIBRAMYCIN) 100 MG capsule Take 1 capsule (100 mg total) by mouth 2 (two) times daily. 08/26/16 09/02/16 Yes Cardama, Amadeo Garnet, MD  ezetimibe-simvastatin (VYTORIN) 10-20 MG tablet Take 1/2 tablet by mouth once nightly to control cholesterol 08/24/16  Yes Kirt Boys, DO  furosemide (LASIX) 20 MG  tablet Take 1 tablet (20 mg total) by mouth 2 (two) times daily. For Hypertension 08/24/16  Yes Montez Morita, Monica, DO  losartan (COZAAR) 50 MG tablet Take 1 tablet (50 mg total) by mouth daily. for high blood pressure 08/24/16  Yes Fontaine, [place], DO  LOTEMAX 0.5 % GEL PLACE 1 DROP IN THE LEFT EYE TWICE DAILY 05/17/16  Yes Kimber Relic, MD  meloxicam (MOBIC) 15 MG tablet Take 1 tablet (15 mg total) by mouth as needed for pain. For foot and ankle pain 08/24/16  Yes Kirt Boys, DO  metFORMIN (GLUCOPHAGE) 500 MG tablet Take One tablet by mouth twice daily to control glucose 08/24/16  Yes Kirt Boys, DO  Nebivolol HCl (BYSTOLIC) 20 MG TABS Take 1 tablet (20 mg total) by mouth daily. For Blood Pressure 08/24/16  Yes Kirt Boys, DO  nitroGLYCERIN (NITROSTAT) 0.4 MG SL tablet Place one under tongue for chest pain, up to 3 tablets 10/22/15  Yes [provider]  polyethylene glycol powder (GLYCOLAX/MIRALAX) powder DISSOLVE 17 GRAMS (1 CAPFUL) INTO 8 OUNCES OF LIQUID AND DRINK EVERY DAY AS NEEDED FOR BOWELS 08/24/16  Yes Montez Morita, Monica, DO  sitaGLIPtin (JANUVIA) 100 MG tablet Take 1 tablet (100 mg total) by mouth daily. For Diabetes 08/24/16  Yes Kirt Boys, DO    Family History No family history on file.  Social History Social History  Substance Use Topics  . Smoking status: Former Games developer  . Smokeless tobacco: Never Used     Comment: "smoked in my teens; quit after 1 year or so"  . Alcohol use No     Allergies   Namzaric [memantine hcl-donepezil hcl]; Percocet [oxycodone-acetaminophen]; Sulfa antibiotics; Adhesive [tape]; Metformin and related; Tradjenta [linagliptin]; and Penicillins   Review of Systems Review of Systems  All other systems reviewed and are negative.    Physical Exam Updated Vital Signs BP (!) 183/62 (BP Location: Left Arm)   Pulse 61   Resp (!) 22   SpO2 100%   Physical Exam  Constitutional: She is oriented to person, place, and time. She appears  well-developed and well-nourished.  Non-toxic appearance. No distress.  HENT:  Head: Normocephalic and atraumatic.  Eyes: Conjunctivae, EOM and lids are normal. Pupils are equal, round, and reactive to light.  Neck: Normal range of motion. Neck supple. No tracheal deviation present. No thyroid mass present.  Cardiovascular: Normal rate, regular rhythm and normal heart sounds.  Exam reveals no gallop.   No murmur heard. Pulmonary/Chest: Effort normal and breath sounds normal. No stridor. No respiratory distress. She has no decreased breath sounds. She has no wheezes. She has no rhonchi. She has no rales.  Abdominal: Soft. Normal appearance and bowel sounds are normal. She exhibits no distension. There is  no tenderness. There is no rebound and no CVA tenderness.  Genitourinary: Rectal exam shows tenderness. Rectal exam shows no external hemorrhoid, no internal hemorrhoid and no mass.  Genitourinary Comments: No rectal prolapse appreciated  Musculoskeletal: Normal range of motion. She exhibits no edema or tenderness.  Neurological: She is alert and oriented to person, place, and time. She has normal strength. No cranial nerve deficit or sensory deficit. GCS eye subscore is 4. GCS verbal subscore is 5. GCS motor subscore is 6.  Skin: Skin is warm and dry. No abrasion and no rash noted.  Psychiatric: She has a normal mood and affect. Her speech is normal and behavior is normal.  Nursing note and vitals reviewed.    ED Treatments / Results  Labs (all labs ordered are listed, but only abnormal results are displayed) Labs Reviewed  BASIC METABOLIC PANEL - Abnormal; Notable for the following:       Result Value   Glucose, Bld 114 (*)    BUN 44 (*)    Creatinine, Ser 2.01 (*)    GFR calc non Af Amer 21 (*)    GFR calc Af Amer 25 (*)    All other components within normal limits  CBG MONITORING, ED - Abnormal; Notable for the following:    Glucose-Capillary 114 (*)    All other components within  normal limits  CBC  URINALYSIS, ROUTINE W REFLEX MICROSCOPIC    EKG  EKG Interpretation  Date/Time:  Wednesday August 31 2016 14:27:08 EDT Ventricular Rate:  64 PR Interval:    QRS Duration: 135 QT Interval:  449 QTC Calculation: 464 R Axis:   -42 Text Interpretation:  Sinus rhythm Left bundle branch block agree. subtle lateral ST depression compared to previous Confirmed by Arby Barrette [phone]) on 08/31/2016 2:30:18 PM Also confirmed by Arby Barrette [phone]), editor Madalyn Rob [phone])  on 08/31/2016 2:31:43 PM       Radiology No results found.  Procedures Procedures (including critical care time)  Medications Ordered in ED Medications  sodium chloride 0.9 % bolus 500 mL (not administered)  0.9 %  sodium chloride infusion (not administered)     Initial Impression / Assessment and Plan / ED Course  I have reviewed the triage vital signs and the nursing notes.  Pertinent labs & imaging results that were available during my care of the patient were reviewed by me and considered in my medical decision making (see chart for details).     Patient's EKG is unchanged from prior. Blood pressure stable. Suspect that patient had a vagal effect from severe pain in her rectum. No evidence of rectal prolapse. We discharged home and she will see her doctor tomorrow for an already scheduled visit  Final Clinical Impressions(s) / ED Diagnoses   Final diagnoses:  None    New Prescriptions New Prescriptions   No medications on file     Lorre Nick, MD 08/31/16 [phone]

## 2016-09-01 ENCOUNTER — Ambulatory Visit (INDEPENDENT_AMBULATORY_CARE_PROVIDER_SITE_OTHER): Payer: Medicare Other | Admitting: Internal Medicine

## 2016-09-01 ENCOUNTER — Other Ambulatory Visit: Payer: Self-pay | Admitting: *Deleted

## 2016-09-01 ENCOUNTER — Encounter: Payer: Self-pay | Admitting: Internal Medicine

## 2016-09-01 VITALS — BP 140/80 | HR 61 | Temp 98.7°F | Wt 130.0 lb

## 2016-09-01 DIAGNOSIS — L97919 Non-pressure chronic ulcer of unspecified part of right lower leg with unspecified severity: Secondary | ICD-10-CM

## 2016-09-01 DIAGNOSIS — K6289 Other specified diseases of anus and rectum: Secondary | ICD-10-CM | POA: Diagnosis not present

## 2016-09-01 DIAGNOSIS — K648 Other hemorrhoids: Secondary | ICD-10-CM

## 2016-09-01 DIAGNOSIS — F22 Delusional disorders: Secondary | ICD-10-CM

## 2016-09-01 DIAGNOSIS — I83019 Varicose veins of right lower extremity with ulcer of unspecified site: Secondary | ICD-10-CM

## 2016-09-01 DIAGNOSIS — I872 Venous insufficiency (chronic) (peripheral): Secondary | ICD-10-CM

## 2016-09-01 DIAGNOSIS — F0390 Unspecified dementia without behavioral disturbance: Secondary | ICD-10-CM | POA: Diagnosis not present

## 2016-09-01 NOTE — Patient Instructions (Signed)
Please eat some solid food, especially fruits and vegetables. Drink 6 to 8 8oz glasses of water per day. Continue with daily miralax and two stool softener pills. I recommend anusol suppositories and anusol cream over the counter to help with the hemorrhoid pain. I do not see a prolapse today.

## 2016-09-01 NOTE — Patient Outreach (Signed)
[facility] Care Management  09/01/2016  Andria RheinDoris E Boxwell January 16, 1928 [id number]   RN Health Coach  Attempted screening  outreach call to patient.  Patient was unavailable. No voice mail pickup.  Gean MaidensFrances Hatcher Froning BSN RN [facility] Care Management [phone]

## 2016-09-01 NOTE — Progress Notes (Signed)
Location:  [facility] clinic Provider:  Artasia Thang L. Renato Gailseed, D.O., C.M.D.  Code Status: DNR Goals of Care:  Advanced Directives 08/26/2016  Does Patient Have a Medical Advance Directive? Yes  Type of Advance Directive Healthcare Power of Attorney  Does patient want to make changes to medical advance directive? -  Copy of Healthcare Power of Attorney in Chart? Yes  Would patient like information on creating a medical advance directive? -   Chief Complaint  Patient presents with  . Follow-up    ED follow-up    HPI: Patient is a 83 y.o. female with dementia (last mmse 25/30 but has been as low as 20/30 and none recent), hemorrhoids, CKDIII, diastolic CHF, htn, hyperlipidemia seen today for ED f/u.  She sees Dr. Montez Moritaarter as PCP.  Was in the ED yesterday.  Apparently, she was having severe rectal pain from her "rectal prolapse" on the way there and passed out.  She had a workup in the ED.  The ED indicated no rectal prolapse.  Has not had a bm.  She felt better on Tuesday after she disimpacted herself, but then felt bloated that evening and did go more then.  She's not been eating solid foods since then.  She does not think she'll be able to have a bm.  She reports this happening more when she tries to have a bm.  The pain is excruciating. She is not eating any solid foods b/c of this.  She was told she was not a surgical candidate for any prolapse.    When I discussed my concerns with her daughter (who pt insisted upon kicking out of the room), she reported that her mother has been eating some solid foods, drink fluids).  Also they had noted she had hemorrhoids which I saw and not rectal prolapse and that she was not a surgical candidate per GI.    Meds were all reviewed on Tuesday with NP in clinic, but pt did not remember immediately afterwards.  Blister that ruptured remains covered with a dressing from Tuesday on right leg, no surrounding erythema, no swelling.    She lost 7 lbs since 6/8--suspect  she is eating considerably less than normal due to this paranoid delusion that she has an obstruction.  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  Past Medical History:  Diagnosis Date  . Anemia   . Carotid artery occlusion   . Carotid bruit   . Chronic kidney disease (CKD), stage III (moderate)   . Coronary artery disease    Remote PCI. Had BMS to RCA and DES stent to Diagonal in Jan. 2012   . Diastolic heart failure    EF 55 to 60% per cath in Jan 2012  . DVT (deep venous thrombosis) (HCC)   . Gout   . Hyperlipidemia   . Hypertension   . Ischemic cardiomyopathy    with prior EF of 35%  . NSTEMI (non-ST elevated myocardial infarction) [facility](HCC) Jan 2012   with PCI to RCA and DX per Dr. Excell Seltzerooper  . Obesity   . Pneumonia 2016  . Proteinuria   . Stroke [facility](HCC) 2013   ?  mini    . TIA (transient ischemic attack)   . Type II diabetes mellitus (HCC)    long standing    Past Surgical History:  Procedure Laterality Date  . ABDOMINAL AORTAGRAM N/A 01/17/2014   Procedure: ABDOMINAL AORTAGRAM;  Surgeon: Janetta Horaharles E  Fields, MD;  Location: [facility] CATH LAB;  Service: Cardiovascular;  Laterality: N/A;  . ANKLE FUSION Bilateral   . APPENDECTOMY    . CATARACT EXTRACTION, BILATERAL Bilateral   . CORONARY ANGIOPLASTY WITH STENT PLACEMENT  04/05/2010   distal RCA  . FRACTURE SURGERY Right 2005   bilateral ankles   . TONSILLECTOMY      Allergies  Allergen Reactions  . Namzaric [Memantine Hcl-Donepezil Hcl] Nausea Only    confusion  . Percocet [Oxycodone-Acetaminophen] Other (See Comments)    loosy goosy  . Sulfa Antibiotics Other (See Comments)    Tongue swells  . Adhesive [Tape] Other (See Comments)    Tears skin.  Please use "paper" tape  . Metformin And Related Other (See Comments)    Abnormal Kidney Functions   . Tradjenta [Linagliptin]     Low back ache, resolved once medication stopped   . Penicillins Hives    Tolerates Ceftriaxone,  Has patient had a PCN reaction causing immediate rash, facial/tongue/throat swelling, SOB or lightheadedness with hypotension: Unknown Has patient had a PCN reaction causing severe rash involving mucus membranes or skin necrosis: No Has patient had a PCN reaction that required hospitalization No Has patient had a PCN reaction occurring within the last 10 years: No If all of the above answers are "NO", then may proceed with Cephalosporin use.     Allergies as of 09/01/2016      Reactions   Namzaric [memantine Hcl-donepezil Hcl] Nausea Only   confusion   Percocet [oxycodone-acetaminophen] Other (See Comments)   loosy goosy   Sulfa Antibiotics Other (See Comments)   Tongue swells   Adhesive [tape] Other (See Comments)   Tears skin.  Please use "paper" tape   Metformin And Related Other (See Comments)   Abnormal Kidney Functions    Tradjenta [linagliptin]    Low back ache, resolved once medication stopped    Penicillins Hives   Tolerates Ceftriaxone, Has patient had a PCN reaction causing immediate rash, facial/tongue/throat swelling, SOB or lightheadedness with hypotension: Unknown Has patient had a PCN reaction causing severe rash involving mucus membranes or skin necrosis: No Has patient had a PCN reaction that required hospitalization No Has patient had a PCN reaction occurring within the last 10 years: No If all of the above answers are "NO", then may proceed with Cephalosporin use.      Medication List       Accurate as of 09/01/16  1:37 PM. Always use your most recent med list.          aspirin EC 81 MG tablet Take 81 mg by mouth daily.   BETIMOL 0.5 % ophthalmic solution Generic drug:  timolol INSTILL 1 DROP INTO THE LEFT EYE DAILY   cloNIDine 0.1 MG tablet Commonly known as:  CATAPRES Take 1 tablet (0.1 mg total) by mouth daily. For high blood pressure   docusate sodium 100 MG capsule Commonly known as:  COLACE Take 1 capsule (100 mg total) by mouth 2 (two) times  daily as needed for mild constipation.   doxycycline 100 MG capsule Commonly known as:  VIBRAMYCIN Take 1 capsule (100 mg total) by mouth 2 (two) times daily.   ezetimibe-simvastatin 10-20 MG tablet Commonly known as:  VYTORIN Take 1/2 tablet by mouth once nightly to control cholesterol   furosemide 20 MG tablet Commonly known as:  LASIX Take 1 tablet (20 mg total) by mouth 2 (two) times daily. For Hypertension   losartan 50 MG tablet Commonly known as:  COZAAR Take 1 tablet (50 mg total) by mouth daily. for high blood pressure   LOTEMAX 0.5 % Gel Generic drug:  Loteprednol Etabonate PLACE 1 DROP IN THE LEFT EYE TWICE DAILY   meloxicam 15 MG tablet Commonly known as:  MOBIC Take 1 tablet (15 mg total) by mouth as needed for pain. For foot and ankle pain   metFORMIN 500 MG tablet Commonly known as:  GLUCOPHAGE Take One tablet by mouth twice daily to control glucose   Nebivolol HCl 20 MG Tabs Commonly known as:  BYSTOLIC Take 1 tablet (20 mg total) by mouth daily. For Blood Pressure   nitroGLYCERIN 0.4 MG SL tablet Commonly known as:  NITROSTAT Place one under tongue for chest pain, up to 3 tablets   polyethylene glycol powder powder Commonly known as:  GLYCOLAX/MIRALAX DISSOLVE 17 GRAMS (1 CAPFUL) INTO 8 OUNCES OF LIQUID AND DRINK EVERY DAY AS NEEDED FOR BOWELS   sitaGLIPtin 100 MG tablet Commonly known as:  JANUVIA Take 1 tablet (100 mg total) by mouth daily. For Diabetes       Review of Systems:  Review of Systems  Constitutional: Negative for chills and fever.  HENT: Positive for hearing loss.   Respiratory: Negative for shortness of breath.   Cardiovascular: Negative for chest pain, palpitations and leg swelling.  Gastrointestinal: Positive for constipation. Negative for abdominal pain, blood in stool, diarrhea, heartburn, melena, nausea and vomiting.  Genitourinary:       Rectal pain  Musculoskeletal: Negative for falls.  Neurological: Negative for  dizziness and loss of consciousness.  Endo/Heme/Allergies: Bruises/bleeds easily.  Psychiatric/Behavioral: Positive for depression and memory loss.       Delusions, paranoia    Health Maintenance  Topic Date Due  . TETANUS/TDAP  04/21/2017 (Originally 06/28/1946)  . PNA vac Low Risk Adult (2 of 2 - PCV13) 10/19/2023 (Originally 06/29/1995)  . OPHTHALMOLOGY EXAM  09/29/2016  . INFLUENZA VACCINE  10/19/2016  . HEMOGLOBIN A1C  02/28/2017  . FOOT EXAM  08/24/2017  . DEXA SCAN  Completed    Physical Exam: Vitals:   09/01/16 1309  BP: 140/80  Pulse: 61  Temp: 98.7 F (37.1 C)  TempSrc: Oral  SpO2: 95%  Weight: 130 lb (59 kg)   Body mass index is 25.39 kg/m. Physical Exam  Constitutional: No distress.  Cardiovascular: Normal rate, regular rhythm, normal heart sounds and intact distal pulses.   Pulmonary/Chest: Effort normal and breath sounds normal.  Abdominal: Soft. Bowel sounds are normal. She exhibits no distension and no mass. There is no tenderness. There is no guarding.  Genitourinary: Vagina normal. Rectal exam shows guaiac negative stool. No vaginal discharge found.  Genitourinary Comments: Internal hemorrhoids felt and seen prolapsing but no rectal prolapse present  Musculoskeletal: Normal range of motion.  Unsteady wide-based gait  Skin: Skin is warm and dry.  Chronic stasis dermatitis, right medial lower leg with ulcer nickel-sized with granulation tissue starting to form centrally, but clean and dry under dressing  Psychiatric:  Agitated about her daughters as usual, perseverates about a need for surgery for rectal prolapse I don't see    Labs reviewed: Basic Metabolic Panel:  Recent Labs  16/10/96 1623 08/29/16 1051 08/31/16 1441  NA 142 145 145  K 4.3 4.9 4.0  CL 108 108 108  CO2 26 25 25   GLUCOSE 115* 107* 114*  BUN 23* 43* 44*  CREATININE 1.90* 2.01* 2.01*  CALCIUM 8.5* 8.9 9.2   Liver Function Tests:  Recent Labs  04/20/16  1351 04/29/16 1407  08/29/16 1051  AST 15 21 12   ALT 12 13* 5*  ALKPHOS 67 65 52  BILITOT 0.4 0.4 0.4  PROT 5.8* 6.4* 6.0*  ALBUMIN 3.6 3.8 3.4*    Recent Labs  04/29/16 1407  LIPASE 33   No results for input(s): AMMONIA in the last 8760 hours. CBC:  Recent Labs  04/27/16 1003 04/29/16 1407 08/01/16 0304 08/26/16 1623 08/31/16 1441  WBC 3.2* 5.1 3.9* 3.9* 6.2  NEUTROABS 1,152* 2.9  --  2.1  --   HGB 10.6* 11.4* 13.8 10.8* 12.6  HCT 33.6* 35.4* 41.6 33.4* 38.0  MCV 94.6 93.9 93.5 93.3 91.3  PLT 149 165 146* 181 226   Lipid Panel:  Recent Labs  04/20/16 1351 08/29/16 1051  CHOL 148 130  HDL 42* 39*  LDLCALC 71 52  TRIG 173* 196*  CHOLHDL 3.5 3.3   Lab Results  Component Value Date   HGBA1C 7.1 (H) 08/29/2016    Procedures since last visit: Ct Abdomen Pelvis Wo Contrast  Result Date: 08/17/2016 CLINICAL DATA:  83 year old with history of prolapsed rectum and persistent pain. Weight loss. EXAM: CT ABDOMEN AND PELVIS WITHOUT CONTRAST TECHNIQUE: Multidetector CT imaging of the abdomen and pelvis was performed following the standard protocol without IV contrast. COMPARISON:  06/04/2014 FINDINGS: Lower chest: Mild pleural thickening along the medial lung bases which appears to be chronic. Mild scarring at the lung bases. No large pleural effusions. Coronary artery calcifications. Hepatobiliary: Normal appearance of the liver and gallbladder. No significant biliary dilatation. Pancreas: Evidence for pancreatic atrophy with scattered areas of fatty deposition, particularly in the head and uncinate process. Pancreas head and uncinate process are difficult to evaluate due to this fatty change. No evidence for inflammation or duct dilatation. Spleen: Normal appearance of spleen without enlargement. Adrenals/Urinary Tract: Adrenal glands are normal. Calcification in the left renal hilum may be vascular. Minimal dilatation of the left renal pelvis without any significant hydronephrosis. Urinary  bladder is unremarkable. Again noted is mild dilatation of the right renal pelvis without hydronephrosis. Stomach/Bowel: Normal appearance of stomach and duodenum. Oral contrast within the small and large bowel. No evidence for a bowel obstruction. Appendix has been removed. Moderate amount of stool throughout the colon. There is wall thickening in the rectum measuring up to 6 mm on sequence 2, image 76. There may be mild perirectal fat edema as well. There is concern for some subcutaneous edema in the perineum. Limited evaluation of the anus. Oral contrast is not within the distal colon or rectum. Vascular/Lymphatic: Again noted are prominent inguinal lymph nodes, particularly on the right side. These are not significantly changed since 2016. No suspicious lymphadenopathy in the abdomen or pelvis. Severe wall calcifications involving the abdominal aorta. Visceral arteries are heavily calcified. No evidence for an aortic aneurysm. Common iliac arteries are heavily calcified with vascular stents. Reproductive: Stable low-density structure involving the left adnexa measuring up to 3.1 cm. This was compatible with a simple cyst on the ultrasound from 06/26/2014. Again noted is a calcified uterine fibroid. Other: No free fluid.  Negative for free air. Musculoskeletal: Multilevel facet disease in lower lumbar spine. Grade 1 retrolisthesis and severe disc space loss at L2-L3. IMPRESSION: Rectal wall thickening with mild perirectal edema. Findings are suggestive for proctitis. Limited evaluation of the distal rectum and anus on this noncontrast examination. Diffuse atherosclerotic disease. Stable left adnexal cyst. Electronically Signed   By: Richarda Overlie M.D.   On: 08/17/2016 12:53   Dg  Abd Acute W/chest  Result Date: 08/31/2016 CLINICAL DATA:  Constipation, rectal prolapse EXAM: DG ABDOMEN ACUTE W/ 1V CHEST COMPARISON:  08/17/2016 FINDINGS: Increased interstitial markings. Lingular scarring/ atelectasis. No focal  consolidation. No pleural effusion or pneumothorax. Heart is normal in size. Nonobstructive bowel gas pattern. Normal colonic stool burden. No evidence of free air on the lateral decubitus view. Degenerative changes the lumbar spine. Bilateral iliac stents. Calcified uterine fibroid. IMPRESSION: No evidence of acute cardiopulmonary disease. No evidence of small bowel obstruction or free air. Normal colonic stool burden. Electronically Signed   By: Charline Bills M.D.   On: 08/31/2016 16:25    Assessment/Plan 1. Rectum pain -did not seem to be in much pain during my examination in her abdomen or her rectum  2. Hemorrhoids, internal -large internal hemorrhoids were palpable w/o any blood, heme negative, very little stool in the rectal vault -no rectal prolapse seen -reviewed and put in instructions regular miralax and two stool softener pills, anusol supps and cream for pain  3. Chronic venous insufficiency -ongoing, counseled on elevating legs at rest and importance of compression hose use  4. Venous ulcer of right leg (HCC) -ongoing, keep covered to avoid getting infection, f/u with Shanda Bumps on this -edema only minor today  5. Dementia without behavioral disturbance, unspecified dementia type -progressing, unfortunately with paranoid delusions and obsession about having a rectal prolapse when chart indicates she has had hemorrhoids and is not a surgical candidate to repair them -she has been taking very little solid food as a result thinking she has a bowel obstruction when she does not -she does not allow her daughters to participate in her care and clearly is lacking insight into her disease processes -should have guardianship established in a court of law in order to receive the best care -fortunately, her daughter does report she is eating some, encouraged supplemental shakes since they are liquid and pt is willing to take liquids -needs MMSE reevaluated next visit--recommend two  letters to indicate she's unable to make medical decisions for herself so guardianship can be obtained by a child  6. Delusions (HCC) -see #5 -due to dementia -pt really needs at least AL level of care or memory care unit  Labs/tests ordered:  No orders of the defined types were placed in this encounter.  Next appt:  09/28/2016   Cheryl Chay L. Pranav Lince, D.O. Geriatrics [facility] [address], [place] [address] Cell Phone (Mon-Fri 8am-5pm):  [phone] On Call:  [phone] & follow prompts after 5pm & weekends Office Phone:  [phone] Office Fax:  [phone]

## 2016-09-05 ENCOUNTER — Emergency Department (HOSPITAL_COMMUNITY)
Admission: EM | Admit: 2016-09-05 | Discharge: 2016-09-06 | Disposition: A | Discharge status: Home / Self Care | Payer: Medicare Other | Attending: Emergency Medicine | Admitting: Emergency Medicine

## 2016-09-05 ENCOUNTER — Encounter (HOSPITAL_COMMUNITY): Payer: Self-pay

## 2016-09-05 ENCOUNTER — Other Ambulatory Visit: Payer: Self-pay | Admitting: *Deleted

## 2016-09-05 DIAGNOSIS — E1122 Type 2 diabetes mellitus with diabetic chronic kidney disease: Secondary | ICD-10-CM | POA: Diagnosis not present

## 2016-09-05 DIAGNOSIS — R109 Unspecified abdominal pain: Secondary | ICD-10-CM | POA: Diagnosis not present

## 2016-09-05 DIAGNOSIS — Z7984 Long term (current) use of oral hypoglycemic drugs: Secondary | ICD-10-CM | POA: Insufficient documentation

## 2016-09-05 DIAGNOSIS — K6289 Other specified diseases of anus and rectum: Secondary | ICD-10-CM | POA: Diagnosis present

## 2016-09-05 DIAGNOSIS — Z79899 Other long term (current) drug therapy: Secondary | ICD-10-CM | POA: Insufficient documentation

## 2016-09-05 DIAGNOSIS — N183 Chronic kidney disease, stage 3 (moderate): Secondary | ICD-10-CM | POA: Insufficient documentation

## 2016-09-05 DIAGNOSIS — I110 Hypertensive heart disease with heart failure: Secondary | ICD-10-CM | POA: Diagnosis not present

## 2016-09-05 DIAGNOSIS — Z7982 Long term (current) use of aspirin: Secondary | ICD-10-CM | POA: Diagnosis not present

## 2016-09-05 DIAGNOSIS — Z955 Presence of coronary angioplasty implant and graft: Secondary | ICD-10-CM | POA: Insufficient documentation

## 2016-09-05 DIAGNOSIS — I2510 Atherosclerotic heart disease of native coronary artery without angina pectoris: Secondary | ICD-10-CM | POA: Diagnosis not present

## 2016-09-05 DIAGNOSIS — I5032 Chronic diastolic (congestive) heart failure: Secondary | ICD-10-CM | POA: Insufficient documentation

## 2016-09-05 DIAGNOSIS — Z87891 Personal history of nicotine dependence: Secondary | ICD-10-CM | POA: Diagnosis not present

## 2016-09-05 NOTE — Patient Outreach (Signed)
[facility] Care Management  09/05/2016  [name] 07-08-1927 [id number]   RN Health Coach  Attempted #2  screening  outreach call to patient.  Patient was unavailable. No voice mail pickup. Plan: RN will call patient again within 14 days.  Gean MaidensFrances Lusine Corlett BSN RN [facility] Care Management [phone]

## 2016-09-05 NOTE — ED Triage Notes (Signed)
Pt from home with [facility]. She is c/o of rectal pain d/t rectal prolapse starting 2 hours ago. Pt was given 100 mcg of fentanyl en route. Pt seen recently for same. GI consult scheduled for Wednesday. Hx of dementia.

## 2016-09-06 ENCOUNTER — Emergency Department (HOSPITAL_COMMUNITY): Payer: Medicare Other

## 2016-09-06 DIAGNOSIS — K6289 Other specified diseases of anus and rectum: Secondary | ICD-10-CM | POA: Diagnosis not present

## 2016-09-06 MED ORDER — LORAZEPAM 0.5 MG PO TABS
0.5000 mg | ORAL_TABLET | Freq: Once | ORAL | Status: AC
  Administered 2016-09-06: 0.5 mg via ORAL
  Filled 2016-09-06: qty 1

## 2016-09-06 NOTE — ED Provider Notes (Signed)
[facility]-EMERGENCY DEPT Provider Note   CSN: [id number] Arrival date & time: 09/05/16  2337   By signing my name below, I, Clarisse GougeXavier Herndon, attest that this documentation has been prepared under the direction and in the presence of Zadie RhineWickline, Margreat Widener, MD. Electronically signed, Clarisse GougeXavier Herndon, ED Scribe. 09/06/16. 3:42 AM.   History   Chief Complaint Chief Complaint  Patient presents with  . Rectal Pain   The history is provided by the patient and medical records.    [name] is a 83 y.o. female with h/o  rectal prolapse and hemorrhoids BIB EMS to the Emergency Department concerning rectal pain x "some time now". "Occasional" abdominal pain noted. Triage states the pt's pain is believed to be d/t anal prolapse and began ~2 hours PTA. Pt given 100 mcg fentanyl en route with EMS. Pt seen on 08/31/2016 for similar with no anal prolapse noted on this evaluation. No abdominal surgeries noted historically. No chest pain, dysuria or N/V/D.  Past Medical History:  Diagnosis Date  . Anemia   . Carotid artery occlusion   . Carotid bruit   . Chronic kidney disease (CKD), stage III (moderate)   . Coronary artery disease    Remote PCI. Had BMS to RCA and DES stent to Diagonal in Jan. 2012   . Diastolic heart failure    EF 55 to 60% per cath in Jan 2012  . DVT (deep venous thrombosis) (HCC)   . Gout   . Hyperlipidemia   . Hypertension   . Ischemic cardiomyopathy    with prior EF of 35%  . NSTEMI (non-ST elevated myocardial infarction) [facility](HCC) Jan 2012   with PCI to RCA and DX per Dr. Excell Seltzerooper  . Obesity   . Pneumonia 2016  . Proteinuria   . Stroke [facility](HCC) 2013   ?  mini    . TIA (transient ischemic attack)   . Type II diabetes mellitus (HCC)    long standing    Patient Active Problem List   Diagnosis Date Noted  . Hemorrhoids 05/04/2016  . Pain in joint, lower leg 01/20/2016  . Ptosis 11/04/2015  . Glaucoma 08/25/2015  . Dementia 06/10/2015  . Expressive aphasia 05/26/2015  . Anal  prolapse 02/25/2015  . Chronic kidney disease, stage III (moderate) 02/10/2014  . PAD (peripheral artery disease) (HCC) 01/17/2014  . Constipation 07/31/2013  . Atherosclerosis of native artery of extremity with intermittent claudication (HCC) 07/04/2013  . Anxiety disorder 02/21/2013  . Diabetes mellitus with nephropathy (HCC) 07/05/2012  . Occlusion and stenosis of carotid artery without mention of cerebral infarction 06/21/2012  . Coronary artery disease   . Chronic diastolic heart failure, NYHA class 1 (HCC)   . Hyperlipidemia   . Hypertension   . Carotid bruit   . Gout   . Obesity   . TIA (transient ischemic attack)     Past Surgical History:  Procedure Laterality Date  . ABDOMINAL AORTAGRAM N/A 01/17/2014   Procedure: ABDOMINAL Ronny FlurryAORTAGRAM;  Surgeon: Sherren Kernsharles E Fields, MD;  [facility];  Service: Cardiovascular;  Laterality: N/A;  . ANKLE FUSION Bilateral   . APPENDECTOMY    . CATARACT EXTRACTION, BILATERAL Bilateral   . CORONARY ANGIOPLASTY WITH STENT PLACEMENT  04/05/2010   distal RCA  . FRACTURE SURGERY Right 2005   bilateral ankles   . TONSILLECTOMY      OB History    No data available       Home Medications    Prior to Admission medications  Medication Sig Start Date End Date Taking? Authorizing Provider  aspirin EC 81 MG tablet Take 81 mg by mouth daily.    [provider]  BETIMOL 0.5 % ophthalmic solution INSTILL 1 DROP INTO THE LEFT EYE DAILY 04/19/16   Kimber Relic, MD  cloNIDine (CATAPRES) 0.1 MG tablet Take 1 tablet (0.1 mg total) by mouth daily. For high blood pressure 08/24/16   Kirt Boys, DO  docusate sodium (COLACE) 100 MG capsule Take 1 capsule (100 mg total) by mouth 2 (two) times daily as needed for mild constipation. 08/24/16   Kirt Boys, DO  ezetimibe-simvastatin (VYTORIN) 10-20 MG tablet Take 1/2 tablet by mouth once nightly to control cholesterol 08/24/16   Kirt Boys, DO  furosemide (LASIX) 20 MG tablet Take 1  tablet (20 mg total) by mouth 2 (two) times daily. For Hypertension 08/24/16   Kirt Boys, DO  losartan (COZAAR) 50 MG tablet Take 1 tablet (50 mg total) by mouth daily. for high blood pressure 08/24/16   Montez Morita, Florien, DO  LOTEMAX 0.5 % GEL PLACE 1 DROP IN THE LEFT EYE TWICE DAILY 05/17/16   Kimber Relic, MD  meloxicam (MOBIC) 15 MG tablet Take 1 tablet (15 mg total) by mouth as needed for pain. For foot and ankle pain 08/24/16   Kirt Boys, DO  metFORMIN (GLUCOPHAGE) 500 MG tablet Take One tablet by mouth twice daily to control glucose 08/24/16   Kirt Boys, DO  Nebivolol HCl (BYSTOLIC) 20 MG TABS Take 1 tablet (20 mg total) by mouth daily. For Blood Pressure 08/24/16   Kirt Boys, DO  nitroGLYCERIN (NITROSTAT) 0.4 MG SL tablet Place one under tongue for chest pain, up to 3 tablets 10/22/15   [provider]  polyethylene glycol powder (GLYCOLAX/MIRALAX) powder DISSOLVE 17 GRAMS (1 CAPFUL) INTO 8 OUNCES OF LIQUID AND DRINK EVERY DAY AS NEEDED FOR BOWELS 08/24/16   Kirt Boys, DO  sitaGLIPtin (JANUVIA) 100 MG tablet Take 1 tablet (100 mg total) by mouth daily. For Diabetes 08/24/16   Kirt Boys, DO    Family History History reviewed. No pertinent family history.  Social History Social History  Substance Use Topics  . Smoking status: Former Games developer  . Smokeless tobacco: Never Used     Comment: "smoked in my teens; quit after 1 year or so"  . Alcohol use No     Allergies   Namzaric [memantine hcl-donepezil hcl]; Percocet [oxycodone-acetaminophen]; Sulfa antibiotics; Adhesive [tape]; Metformin and related; Tradjenta [linagliptin]; and Penicillins   Review of Systems Review of Systems  Cardiovascular: Negative for chest pain.  Gastrointestinal: Positive for abdominal pain and rectal pain. Negative for diarrhea, nausea and vomiting.  Genitourinary: Negative for dysuria.  All other systems reviewed and are negative.    Physical Exam Updated Vital Signs BP (!)  164/47 (BP Location: Right Arm)   Pulse 62   Temp 98.2 F (36.8 C) (Oral)   Resp 18   SpO2 93%   Physical Exam CONSTITUTIONAL: elderly and frail HEAD: Normocephalic/atraumatic ENMT: Mucous membranes moist NECK: supple no meningeal signs SPINE/BACK:entire spine nontender CV: S1/S2 noted, no murmurs/rubs/gallops noted LUNGS: coarse breath sounds bilaterally ABDOMEN: soft, nontender, no rebound or guarding, bowel sounds noted throughout abdomen GU:no cva tenderness Rectal: No rectal prolapse noted, no abscess noted, no blood or melena noted. Mild erythema noted to rectum. Nurse Roseanna Rainbow present for exam. NEURO: Pt is awake/alert/appropriate, moves all extremitiesx4.  No facial droop.   EXTREMITIES: pulses normal/equal, full ROM SKIN: warm, color normal PSYCH: no  abnormalities of mood noted, alert and oriented to situation   ED Treatments / Results  DIAGNOSTIC STUDIES: Oxygen Saturation is 93% on [place], low by my interpretation.    COORDINATION OF CARE: 3:30 AM-Discussed next steps with pt. Pt verbalized understanding and is agreeable with the plan.   Labs (all labs ordered are listed, but only abnormal results are displayed) Labs Reviewed - No data to display  EKG  EKG Interpretation None       Radiology Dg Chest Portable 1 View  Result Date: 09/06/2016 CLINICAL DATA:  Shortness of breath. EXAM: PORTABLE CHEST 1 VIEW COMPARISON:  Chest radiograph 08/31/2016 lung bases from abdominal CT 08/17/2016 FINDINGS: Low lung volumes. Unchanged heart size and mediastinal contours with mild cardiomegaly and atherosclerosis of the thoracic aorta. Right hilar prominence is chronic and related to the right pulmonary artery. Atelectasis or scarring in the left mid lung. No consolidation, pulmonary edema, pleural effusion or pneumothorax. Bones are under mineralized. IMPRESSION: 1. Low lung volumes.  Left midlung atelectasis or scarring. 2. Thoracic aortic atherosclerosis. Electronically Signed    By: Rubye Oaks M.D.   On: 09/06/2016 04:14    Procedures Procedures (including critical care time)  Medications Ordered in ED Medications  LORazepam (ATIVAN) tablet 0.5 mg (not administered)     Initial Impression / Assessment and Plan / ED Course  I have reviewed the triage vital signs and the nursing notes.  Pertinent  imaging results that were available during my care of the patient were reviewed by me and considered in my medical decision making (see chart for details).     6:39 AM Pt in the ED for recurrent rectal prolapse No prolapse noted on exam Also noted to have mild hypoxia, CXR done and negative Pt poor historian, suspect underlying dementia She became agitated, yelling but unable to give any details  She is now improving When compared to recent evaluation, I don't see any other acute/emergent process Family has been called to take her home 7:41 AM Pt stable Resting comfortably. She was given ativan for her agitation, now sleeping Awaiting family to take patient home No other acute issue at this time  Final Clinical Impressions(s) / ED Diagnoses   Final diagnoses:  Rectal pain    New Prescriptions New Prescriptions   No medications on file  I personally performed the services described in this documentation, which was scribed in my presence. The recorded information has been reviewed and is accurate.       Zadie Rhine, MD 09/06/16 [phone]
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
## 2016-09-06 NOTE — ED Notes (Signed)
Alger MemosLinda Brown (daughter) [phone]

## 2016-09-06 NOTE — ED Notes (Signed)
O2 sats dropped to 83% New pulse ox tried and sats still too low. Pt placed on 2L O2

## 2016-09-06 NOTE — ED Notes (Signed)
Family called to pick patient up per Dr. Bebe ShaggyWickline.  Family states that they are trying to wake up her daughter to come get her but she is not wanting to wake up.

## 2016-09-07 ENCOUNTER — Ambulatory Visit: Payer: Self-pay | Admitting: *Deleted

## 2016-09-07 ENCOUNTER — Other Ambulatory Visit: Payer: Self-pay | Admitting: *Deleted

## 2016-09-07 NOTE — Patient Outreach (Signed)
[facility] Care Management  09/07/2016  Andria RheinDoris E Ruscitti Jul 27, 1927 [id number] RN Health Coach  attempted #2  Follow up outreach call to patient.  Patient was unavailable. Per family she had went for a Dr. Appointment Plan: RN will call patient again within 14 days.  Gean MaidensFrances Lurdes Haltiwanger BSN RN [facility] Care Management [phone]

## 2016-09-08 ENCOUNTER — Encounter: Payer: Self-pay | Admitting: Nurse Practitioner

## 2016-09-08 ENCOUNTER — Ambulatory Visit (INDEPENDENT_AMBULATORY_CARE_PROVIDER_SITE_OTHER): Payer: Medicare Other | Admitting: Nurse Practitioner

## 2016-09-08 VITALS — BP 136/84 | HR 71 | Temp 98.0°F | Resp 17 | Ht 60.0 in | Wt 129.4 lb

## 2016-09-08 DIAGNOSIS — K6289 Other specified diseases of anus and rectum: Secondary | ICD-10-CM

## 2016-09-08 DIAGNOSIS — L97911 Non-pressure chronic ulcer of unspecified part of right lower leg limited to breakdown of skin: Secondary | ICD-10-CM | POA: Diagnosis not present

## 2016-09-08 DIAGNOSIS — F0390 Unspecified dementia without behavioral disturbance: Secondary | ICD-10-CM

## 2016-09-08 LAB — TSH: TSH: 0.78 mIU/L

## 2016-09-08 NOTE — Patient Instructions (Addendum)
Leg ulcer has improved. Make sure to keep clean Notify if worsens  Keep follow up with Dr Montez Moritaarter
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
## 2016-09-09 ENCOUNTER — Telehealth: Payer: Self-pay

## 2016-09-09 DIAGNOSIS — F0390 Unspecified dementia without behavioral disturbance: Secondary | ICD-10-CM

## 2016-09-09 LAB — RPR

## 2016-09-09 NOTE — Telephone Encounter (Signed)
[name], Ashley K, NP  [name], [name], CMA        Attempted to call Ms [name], pts emergency contact on chart twice in regards to last OV and her worsening MMSE. If she calls back you can just tell her our concern over her decline in memory. We could do another CT of the head if there has been a drastic change but I suspect this has been a gradual decline. Last CT from March 2017 showed atrophy. MMSE has worsen from 25/30 to 15/30 since November, her memory has gone from mild to moderate/severe.   Ms. [name] called back and I discussed the above information with her. She stated that she understood that the above was true but she nor her sister knew what to do. She stated that a memory facility was not an option due to financial concerns plus the fact that patient has several cats that she does not want to leave behind. Ms. [name] asked if would be possible to have a social worker to come out and walk the family through any options that might be available. A C3 referral has been placed.   FYI: Ms. [name] did state that patient was told by GI that she does not have a rectal prolapse, she has extremely severe hemorrhoids that are inflamed. Ms. [name] did confirm that patient is not a candidate for surgical repair but patient will not accept this. Daughter also informed that patient claims that she has had severe bowel impactions when in fact, she has watery stools most of the time due to not eating properly and enforcing liquid diets on herself due to rectal prolapse. Daughter stated that loose stools are one reason why pt refuses to leave home most of the time because she is afraid that she will have an accident.

## 2016-09-12 ENCOUNTER — Ambulatory Visit: Payer: Self-pay | Admitting: *Deleted

## 2016-09-13 NOTE — Telephone Encounter (Signed)
Agree with C3 referral.  This is a very difficult case in which the patient needs a guardian ideally so that she can receive the best and safest care.  She lacks insight into her cognitive decline and misunderstands her health conditions.  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  Ideally, she should be seen by a psychiatrist, as well, to support her need for a guardian.  Hopefully, C3 can assist with this further.

## 2016-09-14 ENCOUNTER — Ambulatory Visit: Payer: Medicare Other | Admitting: Podiatry

## 2016-09-27 ENCOUNTER — Other Ambulatory Visit: Payer: Self-pay | Admitting: *Deleted

## 2016-09-27 ENCOUNTER — Telehealth: Payer: Self-pay

## 2016-09-27 ENCOUNTER — Encounter: Payer: Self-pay | Admitting: *Deleted

## 2016-09-27 NOTE — Patient Outreach (Signed)
[facility] Care Management  09/27/2016  [name] 09/27/1927 [id number]  RN Health Coach received return  telephone call from patient daughter [name] who has health care POA..  Per daughter the patient is hard of hearing and has dementia. Per daughter the patient also  has paranoia also. Per daughter the Dr office has made her aware that her mother's demential is progressing. Patient needs assistance but is easily agitated and feels that someone is trying to harm her at times. . Per daughter she would like for RN Health Coach to send the brochure and magnet and let her try to talk with her mother and then see if her mother will allow a nurse and pharmacist to come out to see her. Patient also has history of  DM, HTN, CKD stage III, hyperlipidemia and Glaucoma.   Plan: RN will send Brochure to daughter 24 hr magnet Business card  [name] BSN RN [facility] Care Management [phone]

## 2016-09-27 NOTE — Patient Outreach (Signed)
[facility]) Care Management  09/27/2016  Andria RheinDoris E Mashek 02-Sep-1927 [id number]  RN Health Coach  Attempted #2 screening  outreach call to patient.  RN Health Coach left message with return call back number.  Gean MaidensFrances Kory Panjwani BSN RN [facility] Care Management [phone]

## 2016-09-27 NOTE — Telephone Encounter (Signed)
A fax was received from [facility] stating that vytorin 10-20 mg tablets were no longer covered by patient's insurance. The form also stated that insurance would not allow a prior authorization to be done.   I called

## 2016-09-27 NOTE — Telephone Encounter (Signed)
I called the insurance company , [employer], and was told that medication does not need a prior authorization.   Was told to inform pharmacy for case reference number: [id number]  I called the pharmacy back with the above information and was told that they could only get the generic of this medication to go through. The patient cannot get the brand name anymore since it is not covered.   Patient was notified and agreed.

## 2016-09-28 ENCOUNTER — Ambulatory Visit (INDEPENDENT_AMBULATORY_CARE_PROVIDER_SITE_OTHER): Payer: Medicare Other | Admitting: Internal Medicine

## 2016-09-28 ENCOUNTER — Encounter: Payer: Self-pay | Admitting: Internal Medicine

## 2016-09-28 VITALS — BP 154/80 | HR 57 | Temp 98.0°F

## 2016-09-28 DIAGNOSIS — Z9114 Patient's other noncompliance with medication regimen: Secondary | ICD-10-CM

## 2016-09-28 DIAGNOSIS — I10 Essential (primary) hypertension: Secondary | ICD-10-CM | POA: Diagnosis not present

## 2016-09-28 DIAGNOSIS — B029 Zoster without complications: Secondary | ICD-10-CM

## 2016-09-28 DIAGNOSIS — F0390 Unspecified dementia without behavioral disturbance: Secondary | ICD-10-CM

## 2016-09-28 DIAGNOSIS — R419 Unspecified symptoms and signs involving cognitive functions and awareness: Secondary | ICD-10-CM | POA: Insufficient documentation

## 2016-09-28 DIAGNOSIS — E1121 Type 2 diabetes mellitus with diabetic nephropathy: Secondary | ICD-10-CM | POA: Diagnosis not present

## 2016-09-28 DIAGNOSIS — F22 Delusional disorders: Secondary | ICD-10-CM

## 2016-09-28 MED ORDER — TIMOLOL HEMIHYDRATE 0.5 % OP SOLN: OPHTHALMIC | 1 refills | Status: DC

## 2016-09-28 MED ORDER — METFORMIN HCL 500 MG PO TABS: ORAL_TABLET | ORAL | 3 refills | Status: DC

## 2016-09-28 MED ORDER — MELOXICAM 15 MG PO TABS: 15.0000 mg | ORAL_TABLET | ORAL | 3 refills | Status: DC | PRN

## 2016-09-28 MED ORDER — ZOSTER VAC RECOMB ADJUVANTED 50 MCG/0.5ML IM SUSR: 0.5000 mL | Freq: Once | INTRAMUSCULAR | 1 refills | Status: AC

## 2016-09-28 MED ORDER — LOSARTAN POTASSIUM 50 MG PO TABS: 50.0000 mg | ORAL_TABLET | Freq: Every day | ORAL | 3 refills | Status: DC

## 2016-09-28 MED ORDER — POLYETHYLENE GLYCOL 3350 17 GM/SCOOP PO POWD: ORAL | 5 refills | Status: DC

## 2016-09-28 MED ORDER — CLONIDINE HCL 0.1 MG PO TABS: 0.1000 mg | ORAL_TABLET | Freq: Every day | ORAL | 3 refills | Status: DC

## 2016-09-28 MED ORDER — NEBIVOLOL HCL 20 MG PO TABS: 20.0000 mg | ORAL_TABLET | Freq: Every day | ORAL | 3 refills | Status: DC

## 2016-09-28 MED ORDER — NITROGLYCERIN 0.4 MG SL SUBL: SUBLINGUAL_TABLET | SUBLINGUAL | 3 refills | Status: DC

## 2016-09-28 MED ORDER — FUROSEMIDE 20 MG PO TABS: 20.0000 mg | ORAL_TABLET | Freq: Two times a day (BID) | ORAL | 3 refills | Status: DC

## 2016-09-28 MED ORDER — EZETIMIBE-SIMVASTATIN 10-20 MG PO TABS: ORAL_TABLET | ORAL | 3 refills | Status: DC

## 2016-09-28 MED ORDER — SITAGLIPTIN PHOSPHATE 100 MG PO TABS: 100.0000 mg | ORAL_TABLET | Freq: Every day | ORAL | 3 refills | Status: DC

## 2016-09-28 MED ORDER — LOTEPREDNOL ETABONATE 0.5 % OP GEL: OPHTHALMIC | 1 refills | Status: DC
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
## 2016-09-28 NOTE — Patient Instructions (Addendum)
PLEASE BRING ALL MEDICATIONS TO NEXT APPOINTMENT  She declined treatment for shingles today. Recommend hold zostavax due to acute infection  Apply cool compress to itchy areas as needed  Continue ALL medications as ordered. Contacted [facility]  Follow up in 1 mos for HTN, dementia

## 2016-10-01 ENCOUNTER — Emergency Department (HOSPITAL_COMMUNITY)
Admission: EM | Admit: 2016-10-01 | Discharge: 2016-10-02 | Disposition: A | Discharge status: Home / Self Care | Payer: Medicare Other | Attending: Emergency Medicine | Admitting: Emergency Medicine

## 2016-10-01 ENCOUNTER — Encounter (HOSPITAL_COMMUNITY): Payer: Self-pay | Admitting: Emergency Medicine

## 2016-10-01 DIAGNOSIS — I2510 Atherosclerotic heart disease of native coronary artery without angina pectoris: Secondary | ICD-10-CM | POA: Diagnosis not present

## 2016-10-01 DIAGNOSIS — K5900 Constipation, unspecified: Secondary | ICD-10-CM | POA: Diagnosis present

## 2016-10-01 DIAGNOSIS — Z8673 Personal history of transient ischemic attack (TIA), and cerebral infarction without residual deficits: Secondary | ICD-10-CM | POA: Diagnosis not present

## 2016-10-01 DIAGNOSIS — Z79899 Other long term (current) drug therapy: Secondary | ICD-10-CM | POA: Diagnosis not present

## 2016-10-01 DIAGNOSIS — R339 Retention of urine, unspecified: Secondary | ICD-10-CM | POA: Insufficient documentation

## 2016-10-01 DIAGNOSIS — Z87891 Personal history of nicotine dependence: Secondary | ICD-10-CM | POA: Insufficient documentation

## 2016-10-01 DIAGNOSIS — E1122 Type 2 diabetes mellitus with diabetic chronic kidney disease: Secondary | ICD-10-CM | POA: Insufficient documentation

## 2016-10-01 DIAGNOSIS — Z7982 Long term (current) use of aspirin: Secondary | ICD-10-CM | POA: Insufficient documentation

## 2016-10-01 DIAGNOSIS — N183 Chronic kidney disease, stage 3 (moderate): Secondary | ICD-10-CM | POA: Insufficient documentation

## 2016-10-01 DIAGNOSIS — I130 Hypertensive heart and chronic kidney disease with heart failure and stage 1 through stage 4 chronic kidney disease, or unspecified chronic kidney disease: Secondary | ICD-10-CM | POA: Diagnosis not present

## 2016-10-01 DIAGNOSIS — I5032 Chronic diastolic (congestive) heart failure: Secondary | ICD-10-CM | POA: Insufficient documentation

## 2016-10-01 DIAGNOSIS — Z7984 Long term (current) use of oral hypoglycemic drugs: Secondary | ICD-10-CM | POA: Insufficient documentation

## 2016-10-01 LAB — CBC
HEMATOCRIT: 30.8 % — AB (ref 36.0–46.0)
HEMOGLOBIN: 10.1 g/dL — AB (ref 12.0–15.0)
MCH: 30.3 pg (ref 26.0–34.0)
MCHC: 32.8 g/dL (ref 30.0–36.0)
MCV: 92.5 fL (ref 78.0–100.0)
Platelets: 198 10*3/uL (ref 150–400)
RBC: 3.33 MIL/uL — ABNORMAL LOW (ref 3.87–5.11)
RDW: 14.2 % (ref 11.5–15.5)
WBC: 3.8 10*3/uL — AB (ref 4.0–10.5)

## 2016-10-01 LAB — URINALYSIS, ROUTINE W REFLEX MICROSCOPIC
BILIRUBIN URINE: NEGATIVE
Glucose, UA: NEGATIVE mg/dL
KETONES UR: NEGATIVE mg/dL
LEUKOCYTES UA: NEGATIVE
NITRITE: NEGATIVE
PROTEIN: 30 mg/dL — AB
RBC / HPF: NONE SEEN RBC/hpf (ref 0–5)
SQUAMOUS EPITHELIAL / LPF: NONE SEEN
Specific Gravity, Urine: 1.004 — ABNORMAL LOW (ref 1.005–1.030)
pH: 6 (ref 5.0–8.0)

## 2016-10-01 LAB — COMPREHENSIVE METABOLIC PANEL
ALBUMIN: 3.1 g/dL — AB (ref 3.5–5.0)
ALT: 9 U/L — ABNORMAL LOW (ref 14–54)
ANION GAP: 8 (ref 5–15)
AST: 14 U/L — AB (ref 15–41)
Alkaline Phosphatase: 49 U/L (ref 38–126)
BILIRUBIN TOTAL: 0.5 mg/dL (ref 0.3–1.2)
BUN: 44 mg/dL — AB (ref 6–20)
CHLORIDE: 109 mmol/L (ref 101–111)
CO2: 22 mmol/L (ref 22–32)
Calcium: 8.7 mg/dL — ABNORMAL LOW (ref 8.9–10.3)
Creatinine, Ser: 2.16 mg/dL — ABNORMAL HIGH (ref 0.44–1.00)
GFR calc Af Amer: 22 mL/min — ABNORMAL LOW (ref >60)
GFR, EST NON AFRICAN AMERICAN: 19 mL/min — AB (ref >60)
Glucose, Bld: 145 mg/dL — ABNORMAL HIGH (ref 65–99)
POTASSIUM: 4.3 mmol/L (ref 3.5–5.1)
Sodium: 139 mmol/L (ref 135–145)
TOTAL PROTEIN: 5.7 g/dL — AB (ref 6.5–8.1)

## 2016-10-01 LAB — LIPASE, BLOOD: Lipase: 21 U/L (ref 11–51)

## 2016-10-01 MED ORDER — SODIUM CHLORIDE 0.9 % IV BOLUS (SEPSIS)
500.0000 mL | Freq: Once | INTRAVENOUS | Status: AC
  Administered 2016-10-01: 500 mL via INTRAVENOUS

## 2016-10-01 NOTE — ED Triage Notes (Addendum)
Pt to ED c/o constipation and not being able to urinate since yesterday despite drinking water today. She states she hasn't had a BM in days and she has pain in her "rectum and urinary tract." Patient denies fevers/chills/N/V. Patient also c/o very dry mouth and inability to sit in wheelchair d/t pain in her bottom.

## 2016-10-01 NOTE — Discharge Instructions (Signed)
Contact a health care provider if: °You develop a low-grade fever. °You experience spasms or leakage of urine with the spasms. °Get help right away if: °You develop chills or fever. °Your catheter stops draining urine. °Your catheter falls out. °You start to develop increased bleeding that does not respond to rest and increased fluid intake. °

## 2016-10-01 NOTE — ED Provider Notes (Signed)
[facility] DEPT Provider Note   CSN: [id number] Arrival date & time: 10/01/16  1435     History   Chief Complaint Chief Complaint  Patient presents with  . Dysuria    Anuria  . Constipation    HPI [name] is a 83 y.o. female who presents with cc of constipation and urinary urgency. The patient states that she has not made a bm in a month. She states that she eats and drinks very little sick this did not surprise her. She denies any urgency to defecate, abdominal pain, nausea or vomiting. The patient states however that over the past day she began having some discomfort and realized that he had been almost a month since she made a bowel movement. She also has had urgency to urinate without making urine. She was concerned about this. She states that has been also going on for several months and is not new. The patient denies any falls or new injuries to her back. She has chronic lower back pain but states that that is unchanged. She denies weakness in the bilateral lower extremities, saddle anesthesia or any changes to her medications. Upon initial evaluation, the patient is currently making a bowel movement and has urinated some. She denies fevers, chills, malaise or myalgias.  HPI  Past Medical History:  Diagnosis Date  . Anemia   . Carotid artery occlusion   . Carotid bruit   . Chronic kidney disease (CKD), stage III (moderate)   . Coronary artery disease    Remote PCI. Had BMS to RCA and DES stent to Diagonal in Jan. 2012   . Diastolic heart failure    EF 55 to 60% per cath in Jan 2012  . DVT (deep venous thrombosis) (HCC)   . Gout   . Hyperlipidemia   . Hypertension   . Ischemic cardiomyopathy    with prior EF of 35%  . NSTEMI (non-ST elevated myocardial infarction) [facility]) Jan 2012   with PCI to RCA and DX per Dr. Excell Seltzer  . Obesity   . Pneumonia 2016  . Proteinuria   . Stroke [facility]) 2013   ?  mini    . TIA (transient ischemic attack)   . Type II diabetes  mellitus (HCC)    long standing    Patient Active Problem List   Diagnosis Date Noted  . Delusions (HCC) 09/28/2016  . Medication noncompliance due to cognitive impairment 09/28/2016  . Hemorrhoids 05/04/2016  . Pain in joint, lower leg 01/20/2016  . Ptosis 11/04/2015  . Glaucoma 08/25/2015  . Dementia without behavioral disturbance 06/10/2015  . Expressive aphasia 05/26/2015  . Anal prolapse 02/25/2015  . Chronic kidney disease, stage III (moderate) 02/10/2014  . PAD (peripheral artery disease) (HCC) 01/17/2014  . Constipation 07/31/2013  . Atherosclerosis of native artery of extremity with intermittent claudication (HCC) 07/04/2013  . Anxiety disorder 02/21/2013  . Diabetes mellitus with nephropathy (HCC) 07/05/2012  . Occlusion and stenosis of carotid artery without mention of cerebral infarction 06/21/2012  . Coronary artery disease   . Chronic diastolic heart failure, NYHA class 1 (HCC)   . Hyperlipidemia   . Hypertension   . Carotid bruit   . Gout   . Obesity   . TIA (transient ischemic attack)     Past Surgical History:  Procedure Laterality Date  . ABDOMINAL AORTAGRAM N/A 01/17/2014   Procedure: ABDOMINAL Ronny Flurry;  Surgeon: Sherren Kerns, MD;  Location: [facility] CATH LAB;  Service: Cardiovascular;  Laterality: N/A;  .  ANKLE FUSION Bilateral   . APPENDECTOMY    . CATARACT EXTRACTION, BILATERAL Bilateral   . CORONARY ANGIOPLASTY WITH STENT PLACEMENT  04/05/2010   distal RCA  . FRACTURE SURGERY Right 2005   bilateral ankles   . TONSILLECTOMY      OB History    No data available       Home Medications    Prior to Admission medications   Medication Sig Start Date End Date Taking? Authorizing Provider  aspirin EC 81 MG tablet Take 81 mg by mouth daily.    [provider]  cloNIDine (CATAPRES) 0.1 MG tablet Take 1 tablet (0.1 mg total) by mouth daily. For high blood pressure 09/28/16   Kirt Boysarter, Monica, DO  docusate sodium (COLACE) 100 MG capsule Take 1  capsule (100 mg total) by mouth 2 (two) times daily as needed for mild constipation. 08/24/16   Kirt Boysarter, Monica, DO  ezetimibe-simvastatin (VYTORIN) 10-20 MG tablet Take 1/2 tablet by mouth once nightly to control cholesterol 09/28/16   Kirt Boysarter, Monica, DO  furosemide (LASIX) 20 MG tablet Take 1 tablet (20 mg total) by mouth 2 (two) times daily. For Hypertension 09/28/16   Kirt Boysarter, Monica, DO  losartan (COZAAR) 50 MG tablet Take 1 tablet (50 mg total) by mouth daily. for high blood pressure 09/28/16   Montez Moritaarter, [place], DO  Loteprednol Etabonate (LOTEMAX) 0.5 % GEL PLACE 1 DROP IN THE LEFT EYE TWICE DAILY 09/28/16   Kirt Boysarter, Monica, DO  meloxicam (MOBIC) 15 MG tablet Take 1 tablet (15 mg total) by mouth as needed for pain. For foot and ankle pain 09/28/16   Kirt Boysarter, Monica, DO  metFORMIN (GLUCOPHAGE) 500 MG tablet Take One tablet by mouth twice daily to control glucose 09/28/16   Kirt Boysarter, Monica, DO  Nebivolol HCl (BYSTOLIC) 20 MG TABS Take 1 tablet (20 mg total) by mouth daily. For Blood Pressure 09/28/16   Kirt Boysarter, Monica, DO  nitroGLYCERIN (NITROSTAT) 0.4 MG SL tablet Place one under tongue for chest pain, up to 3 tablets 09/28/16   Kirt Boysarter, Monica, DO  polyethylene glycol powder (GLYCOLAX/MIRALAX) powder DISSOLVE 17 GRAMS (1 CAPFUL) INTO 8 OUNCES OF LIQUID AND DRINK EVERY DAY AS NEEDED FOR BOWELS 09/28/16   Kirt Boysarter, Monica, DO  sitaGLIPtin (JANUVIA) 100 MG tablet Take 1 tablet (100 mg total) by mouth daily. For Diabetes 09/28/16   Kirt Boysarter, Monica, DO  timolol (BETIMOL) 0.5 % ophthalmic solution INSTILL 1 DROP INTO THE LEFT EYE DAILY 09/28/16   Kirt Boysarter, Monica, DO    Family History No family history on file.  Social History Social History  Substance Use Topics  . Smoking status: Former Games developermoker  . Smokeless tobacco: Never Used     Comment: "smoked in my teens; quit after 1 year or so"  . Alcohol use No     Allergies   Namzaric [memantine hcl-donepezil hcl]; Percocet [oxycodone-acetaminophen]; Sulfa antibiotics;  Adhesive [tape]; Metformin and related; Tradjenta [linagliptin]; and Penicillins   Review of Systems Review of Systems  Ten systems reviewed and are negative for acute change, except as noted in the HPI.   Physical Exam Updated Vital Signs BP 140/70 (BP Location: Right Arm)   Pulse (!) 54   Temp 98 F (36.7 C) (Oral)   Resp 16   Ht 5' (1.524 m)   Wt 59 kg (130 lb)   SpO2 95%   BMI 25.39 kg/m   Physical Exam  Constitutional: She is oriented to person, place, and time. She appears well-developed and well-nourished. No distress.  HENT:  Head: Normocephalic and atraumatic.  Eyes: Conjunctivae are normal. No scleral icterus.  Neck: Normal range of motion.  Cardiovascular: Normal rate, regular rhythm and normal heart sounds.  Exam reveals no gallop and no friction rub.   No murmur heard. Pulmonary/Chest: Effort normal and breath sounds normal. No respiratory distress.  Abdominal: Soft. Bowel sounds are normal. She exhibits no distension and no mass. There is no tenderness. There is no guarding.  Neurological: She is alert and oriented to person, place, and time.  Skin: Skin is warm and dry. She is not diaphoretic.  Psychiatric: Her behavior is normal.  Nursing note and vitals reviewed.    ED Treatments / Results  Labs (all labs ordered are listed, but only abnormal results are displayed) Labs Reviewed  COMPREHENSIVE METABOLIC PANEL - Abnormal; Notable for the following:       Result Value   Glucose, Bld 145 (*)    BUN 44 (*)    Creatinine, Ser 2.16 (*)    Calcium 8.7 (*)    Total Protein 5.7 (*)    Albumin 3.1 (*)    AST 14 (*)    ALT 9 (*)    GFR calc non Af Amer 19 (*)    GFR calc Af Amer 22 (*)    All other components within normal limits  CBC - Abnormal; Notable for the following:    WBC 3.8 (*)    RBC 3.33 (*)    Hemoglobin 10.1 (*)    HCT 30.8 (*)    All other components within normal limits  LIPASE, BLOOD  URINALYSIS, ROUTINE W REFLEX MICROSCOPIC     EKG  EKG Interpretation None       Radiology No results found.  Procedures Procedures (including critical care time)  Medications Ordered in ED Medications - No data to display   Initial Impression / Assessment and Plan / ED Course  I have reviewed the triage vital signs and the nursing notes.  Pertinent labs & imaging results that were available during my care of the patient were reviewed by me and considered in my medical decision making (see chart for details).    10:32 PM BP 140/70 (BP Location: Right Arm)   Pulse (!) 54   Temp 98 F (36.7 C) (Oral)   Resp 16   Ht 5' (1.524 m)   Wt 59 kg (130 lb)   SpO2 95%   BMI 25.39 kg/m   Patient given 50 ml bolus.Post void residual shows 400+ ml. Able to defecate here in the ED.  Foley cath and leg bag placed.   12:04 AM UA negative. The patient is feeling more comfortable with her leg bag in place and catheter in place. Her creatinine is at baseline. Patient is advised to follow with [facility] on Monday or Tuesday. She's been given care instructions. Discharge instructions and return precautions. She appears safe for discharge at this time.   Final Clinical Impressions(s) / ED Diagnoses   Final diagnoses:  None    New Prescriptions New Prescriptions   No medications on file     Delos Haring 10/02/16 0005    Gerhard Munch, MD 10/02/16 [phone]

## 2016-10-01 NOTE — ED Notes (Signed)
Pt was able to urinate but had a BM

## 2016-10-01 NOTE — ED Notes (Signed)
Bladder scanned patient. Monitor read 187 mL.

## 2016-10-02 NOTE — ED Notes (Signed)
Pt understood dc material. NAD noted. 

## 2016-10-03 ENCOUNTER — Telehealth: Payer: Self-pay

## 2016-10-03 NOTE — Telephone Encounter (Signed)
S/w daughter to let pt know Lawson FiscalLori stated can take antibiotic.

## 2016-10-04 ENCOUNTER — Encounter (HOSPITAL_COMMUNITY): Payer: Self-pay | Admitting: Emergency Medicine

## 2016-10-04 DIAGNOSIS — Y69 Unspecified misadventure during surgical and medical care: Secondary | ICD-10-CM | POA: Insufficient documentation

## 2016-10-04 DIAGNOSIS — N183 Chronic kidney disease, stage 3 (moderate): Secondary | ICD-10-CM | POA: Diagnosis not present

## 2016-10-04 DIAGNOSIS — R339 Retention of urine, unspecified: Secondary | ICD-10-CM | POA: Diagnosis not present

## 2016-10-04 DIAGNOSIS — Z794 Long term (current) use of insulin: Secondary | ICD-10-CM | POA: Insufficient documentation

## 2016-10-04 DIAGNOSIS — F419 Anxiety disorder, unspecified: Secondary | ICD-10-CM | POA: Diagnosis not present

## 2016-10-04 DIAGNOSIS — Z7982 Long term (current) use of aspirin: Secondary | ICD-10-CM | POA: Diagnosis not present

## 2016-10-04 DIAGNOSIS — E1122 Type 2 diabetes mellitus with diabetic chronic kidney disease: Secondary | ICD-10-CM | POA: Insufficient documentation

## 2016-10-04 DIAGNOSIS — I5032 Chronic diastolic (congestive) heart failure: Secondary | ICD-10-CM | POA: Insufficient documentation

## 2016-10-04 DIAGNOSIS — Z87891 Personal history of nicotine dependence: Secondary | ICD-10-CM | POA: Diagnosis not present

## 2016-10-04 DIAGNOSIS — Z8673 Personal history of transient ischemic attack (TIA), and cerebral infarction without residual deficits: Secondary | ICD-10-CM | POA: Diagnosis not present

## 2016-10-04 DIAGNOSIS — I2510 Atherosclerotic heart disease of native coronary artery without angina pectoris: Secondary | ICD-10-CM | POA: Insufficient documentation

## 2016-10-04 DIAGNOSIS — T83031A Leakage of indwelling urethral catheter, initial encounter: Secondary | ICD-10-CM | POA: Insufficient documentation

## 2016-10-04 DIAGNOSIS — I130 Hypertensive heart and chronic kidney disease with heart failure and stage 1 through stage 4 chronic kidney disease, or unspecified chronic kidney disease: Secondary | ICD-10-CM | POA: Diagnosis not present

## 2016-10-04 DIAGNOSIS — Z79899 Other long term (current) drug therapy: Secondary | ICD-10-CM | POA: Diagnosis not present

## 2016-10-04 NOTE — ED Triage Notes (Signed)
Pt to ED with complaints related to foley catheter. Catheter placed 4 days ago for urinary retention, pt states the catheter came out, but there was no pain. Pt's foley in place, but pt's pants are wet.

## 2016-10-05 ENCOUNTER — Emergency Department (HOSPITAL_COMMUNITY)
Admission: EM | Admit: 2016-10-05 | Discharge: 2016-10-05 | Disposition: A | Discharge status: Home / Self Care | Payer: Medicare Other | Attending: Emergency Medicine | Admitting: Emergency Medicine

## 2016-10-05 DIAGNOSIS — T839XXA Unspecified complication of genitourinary prosthetic device, implant and graft, initial encounter: Secondary | ICD-10-CM

## 2016-10-05 DIAGNOSIS — R339 Retention of urine, unspecified: Secondary | ICD-10-CM

## 2016-10-05 DIAGNOSIS — T83031A Leakage of indwelling urethral catheter, initial encounter: Secondary | ICD-10-CM | POA: Diagnosis not present

## 2016-10-05 LAB — URINALYSIS, ROUTINE W REFLEX MICROSCOPIC
Bilirubin Urine: NEGATIVE
GLUCOSE, UA: NEGATIVE mg/dL
KETONES UR: NEGATIVE mg/dL
NITRITE: NEGATIVE
PH: 7 (ref 5.0–8.0)
Protein, ur: 100 mg/dL — AB
Specific Gravity, Urine: 1.004 — ABNORMAL LOW (ref 1.005–1.030)
Squamous Epithelial / LPF: NONE SEEN

## 2016-10-05 NOTE — ED Notes (Addendum)
Pt very upset b/c she has been here for "over 24 hours".  RN attempted to reorient pt however she continued to believe she was here this long.  Pt refused to sign discharge paperwork.

## 2016-10-05 NOTE — ED Notes (Signed)
RN replaced foley per provider request.  Pt came in w/ foley due to urine retention, it "fell out".  Bladder scanner not available, 300cc output.

## 2016-10-05 NOTE — ED Provider Notes (Signed)
[facility] Provider Note   CSN: [id number] Arrival date & time: 10/04/16  2056  By signing my name below, I, Diona Browner, attest that this documentation has been prepared under the direction and in the presence of Roberts Bon, Canary Brim, MD. Electronically Signed: Diona Browner, ED Scribe. 10/05/16. 1:35 AM.  History   Chief Complaint Chief Complaint  Patient presents with  . Urinary Retention    catheter in place   LEVEL V CAVEAT: HPI and ROS limited due to dementia.  HPI [name] is a 83 y.o. female who presents to the Emergency Department with a chief complaint of issues with her foley catheter, which was placed 4 days ago for urinary retention. Pt states the catheter came out, but she doesn't feel any pain. Foley is in place, but her pants are wet. Pt denies any other sx at this time.   The history is provided by the patient. The history is limited by the condition of the patient. No language interpreter was used.    Past Medical History:  Diagnosis Date  . Anemia   . Carotid artery occlusion   . Carotid bruit   . Chronic kidney disease (CKD), stage III (moderate)   . Coronary artery disease    Remote PCI. Had BMS to RCA and DES stent to Diagonal in Jan. 2012   . Diastolic heart failure    EF 55 to 60% per cath in Jan 2012  . DVT (deep venous thrombosis) (HCC)   . Gout   . Hyperlipidemia   . Hypertension   . Ischemic cardiomyopathy    with prior EF of 35%  . NSTEMI (non-ST elevated myocardial infarction) [facility]) Jan 2012   with PCI to RCA and DX per Dr. Excell Seltzer  . Obesity   . Pneumonia 2016  . Proteinuria   . Stroke [facility]) 2013   ?  mini    . TIA (transient ischemic attack)   . Type II diabetes mellitus (HCC)    long standing    Patient Active Problem List   Diagnosis Date Noted  . Delusions (HCC) 09/28/2016  . Medication noncompliance due to cognitive impairment 09/28/2016  . Hemorrhoids 05/04/2016  . Pain in joint, lower leg 01/20/2016  .  Ptosis 11/04/2015  . Glaucoma 08/25/2015  . Dementia without behavioral disturbance 06/10/2015  . Expressive aphasia 05/26/2015  . Anal prolapse 02/25/2015  . Chronic kidney disease, stage III (moderate) 02/10/2014  . PAD (peripheral artery disease) (HCC) 01/17/2014  . Constipation 07/31/2013  . Atherosclerosis of native artery of extremity with intermittent claudication (HCC) 07/04/2013  . Anxiety disorder 02/21/2013  . Diabetes mellitus with nephropathy (HCC) 07/05/2012  . Occlusion and stenosis of carotid artery without mention of cerebral infarction 06/21/2012  . Coronary artery disease   . Chronic diastolic heart failure, NYHA class 1 (HCC)   . Hyperlipidemia   . Hypertension   . Carotid bruit   . Gout   . Obesity   . TIA (transient ischemic attack)     Past Surgical History:  Procedure Laterality Date  . ABDOMINAL AORTAGRAM N/A 01/17/2014   Procedure: ABDOMINAL Ronny Flurry;  Surgeon: Sherren Kerns, MD;  Location: [facility] CATH LAB;  Service: Cardiovascular;  Laterality: N/A;  . ANKLE FUSION Bilateral   . APPENDECTOMY    . CATARACT EXTRACTION, BILATERAL Bilateral   . CORONARY ANGIOPLASTY WITH STENT PLACEMENT  04/05/2010   distal RCA  . FRACTURE SURGERY Right 2005   bilateral ankles   . TONSILLECTOMY  OB History    No data available       Home Medications    Prior to Admission medications   Medication Sig Start Date End Date Taking? Authorizing Provider  aspirin EC 81 MG tablet Take 81 mg by mouth daily.    [provider]  cloNIDine (CATAPRES) 0.1 MG tablet Take 1 tablet (0.1 mg total) by mouth daily. For high blood pressure 09/28/16   Kirt Boysarter, Monica, DO  docusate sodium (COLACE) 100 MG capsule Take 1 capsule (100 mg total) by mouth 2 (two) times daily as needed for mild constipation. 08/24/16   Kirt Boysarter, Monica, DO  ezetimibe-simvastatin (VYTORIN) 10-20 MG tablet Take 1/2 tablet by mouth once nightly to control cholesterol 09/28/16   Kirt Boysarter, Monica, DO    furosemide (LASIX) 20 MG tablet Take 1 tablet (20 mg total) by mouth 2 (two) times daily. For Hypertension 09/28/16   Kirt Boysarter, Monica, DO  losartan (COZAAR) 50 MG tablet Take 1 tablet (50 mg total) by mouth daily. for high blood pressure 09/28/16   Montez Moritaarter, [place], DO  Loteprednol Etabonate (LOTEMAX) 0.5 % GEL PLACE 1 DROP IN THE LEFT EYE TWICE DAILY 09/28/16   Kirt Boysarter, Monica, DO  meloxicam (MOBIC) 15 MG tablet Take 1 tablet (15 mg total) by mouth as needed for pain. For foot and ankle pain 09/28/16   Kirt Boysarter, Monica, DO  metFORMIN (GLUCOPHAGE) 500 MG tablet Take One tablet by mouth twice daily to control glucose 09/28/16   Kirt Boysarter, Monica, DO  Nebivolol HCl (BYSTOLIC) 20 MG TABS Take 1 tablet (20 mg total) by mouth daily. For Blood Pressure 09/28/16   Kirt Boysarter, Monica, DO  nitroGLYCERIN (NITROSTAT) 0.4 MG SL tablet Place one under tongue for chest pain, up to 3 tablets 09/28/16   Kirt Boysarter, Monica, DO  polyethylene glycol powder (GLYCOLAX/MIRALAX) powder DISSOLVE 17 GRAMS (1 CAPFUL) INTO 8 OUNCES OF LIQUID AND DRINK EVERY DAY AS NEEDED FOR BOWELS 09/28/16   Kirt Boysarter, Monica, DO  sitaGLIPtin (JANUVIA) 100 MG tablet Take 1 tablet (100 mg total) by mouth daily. For Diabetes 09/28/16   Kirt Boysarter, Monica, DO  timolol (BETIMOL) 0.5 % ophthalmic solution INSTILL 1 DROP INTO THE LEFT EYE DAILY 09/28/16   Kirt Boysarter, Monica, DO    Family History No family history on file.  Social History Social History  Substance Use Topics  . Smoking status: Former Games developermoker  . Smokeless tobacco: Never Used     Comment: "smoked in my teens; quit after 1 year or so"  . Alcohol use No     Allergies   Namzaric [memantine hcl-donepezil hcl]; Percocet [oxycodone-acetaminophen]; Sulfa antibiotics; Adhesive [tape]; Metformin and related; Tradjenta [linagliptin]; and Penicillins   Review of Systems Review of Systems  Unable to perform ROS: Dementia     Physical Exam Updated Vital Signs BP (!) 148/38   Pulse (!) 56   Temp 98.8 F (37.1  C) (Oral)   Resp 18   Ht 5' (1.524 m)   Wt 59 kg (130 lb)   SpO2 94%   BMI 25.39 kg/m   Physical Exam  Constitutional: She appears well-developed and well-nourished. No distress.  HENT:  Head: Normocephalic and atraumatic.  Right Ear: Hearing normal.  Left Ear: Hearing normal.  Nose: Nose normal.  Mouth/Throat: Oropharynx is clear and moist and mucous membranes are normal.  Eyes: Pupils are equal, round, and reactive to light. Conjunctivae and EOM are normal.  Neck: Normal range of motion. Neck supple.  Cardiovascular: Regular rhythm, S1 normal and S2 normal.  Exam reveals  no gallop and no friction rub.   No murmur heard. Pulmonary/Chest: Effort normal and breath sounds normal. No respiratory distress. She exhibits no tenderness.  Abdominal: Soft. Normal appearance and bowel sounds are normal. There is no hepatosplenomegaly. There is no tenderness. There is no rebound, no guarding, no tenderness at McBurney's point and negative Murphy's sign. No hernia.  Musculoskeletal: Normal range of motion.  Neurological: She has normal strength. She is disoriented. No cranial nerve deficit or sensory deficit. Coordination normal. GCS eye subscore is 4. GCS verbal subscore is 5. GCS motor subscore is 6.  Confused.   Skin: Skin is warm, dry and intact. No rash noted. No cyanosis.  Psychiatric: She has a normal mood and affect. Her speech is normal and behavior is normal. Thought content normal.  Nursing note and vitals reviewed.    ED Treatments / Results  DIAGNOSTIC STUDIES: Oxygen Saturation is 94% on RA, adequate by my interpretation.   Labs (all labs ordered are listed, but only abnormal results are displayed) Labs Reviewed - No data to display  EKG  EKG Interpretation None       Radiology No results found.  Procedures Procedures (including critical care time)  Medications Ordered in ED Medications - No data to display   Initial Impression / Assessment and Plan / ED  Course  I have reviewed the triage vital signs and the nursing notes.  Pertinent labs & imaging results that were available during my care of the patient were reviewed by me and considered in my medical decision making (see chart for details).     Patient presents with complaints over her Foley catheter problem. Patient had a Foley catheter placed several days ago for urinary retention. She came in tonight because he was leaking. It was discovered that the tubing had become dislodged, was replaced. It appears to be draining without difficulty. She is up her for discharge, has follow-up with urology tomorrow.  Final Clinical Impressions(s) / ED Diagnoses   Final diagnoses:  Urinary retention  Complication of Foley catheter, initial encounter [facility])    New Prescriptions New Prescriptions   No medications on file   I personally performed the services described in this documentation, which was scribed in my presence. The recorded information has been reviewed and is accurate.     Gilda Crease, MD 10/05/16 [phone]

## 2016-10-05 NOTE — ED Notes (Signed)
RN went into room and found pt's foley on the floor.  She reports she "stood up and it fell out."  RN informed provider

## 2016-10-05 NOTE — ED Notes (Signed)
Spoke with pt's daughter in lobby, states pt would prefer a regular bag and not a leg bag.  Family prefers to stay in lobby instead of with pt.  Pt has urology appointment at 9am this morning.

## 2016-10-24 ENCOUNTER — Other Ambulatory Visit: Payer: Self-pay | Admitting: *Deleted

## 2016-10-24 DIAGNOSIS — E785 Hyperlipidemia, unspecified: Secondary | ICD-10-CM

## 2016-10-24 DIAGNOSIS — E1169 Type 2 diabetes mellitus with other specified complication: Secondary | ICD-10-CM

## 2016-10-24 DIAGNOSIS — E0800 Diabetes mellitus due to underlying condition with hyperosmolarity without nonketotic hyperglycemic-hyperosmolar coma (NKHHC): Secondary | ICD-10-CM

## 2016-10-24 DIAGNOSIS — I5032 Chronic diastolic (congestive) heart failure: Secondary | ICD-10-CM

## 2016-10-24 NOTE — Patient Outreach (Signed)
[facility] Care Management  10/24/2016   Ashley RheinDoris E Walters 1927/11/13 [id number]  RN Health Coach received telephone call from  Patient daughter [name] who has POA.  [employer] compliance verified. A brochure with information was mailed to daughter to discuss with her mother. Patient agreed with daughter for care . This initial referral was from high ED utilization. Patient has history of Dementia, Paranoid delusion, anxiety, CHF, DM, Per patient she would like for a nurse   to come out and evaluate the patient and see what needs she has. Daughter would like to see her in a senior program to interact with others. Patient has not fallen recently but refuses to use her cane. She wears bilateral hearing aids. The daughter [facility] for exercise classes for patient to meet people. Patient does no interact much with others. Daughter wants the nurse to call the Mobile number [phone] and ask for [name] and she will give additional information if needed. Per daughter patient is paranoid delusional is what physicaian had written. Daughter stated that when the nurse comes to talk with patient she will get up and leave the room.  Per [name] then nurse can call daughter and discuss further care.Marland Kitchen.    Marland Kitchen.Current Medications:  Current Outpatient Prescriptions  Medication Sig Dispense Refill  . aspirin EC 81 MG tablet Take 81 mg by mouth daily.    . cloNIDine (CATAPRES) 0.1 MG tablet Take 1 tablet (0.1 mg total) by mouth daily. For high blood pressure 30 tablet 3  . docusate sodium (COLACE) 100 MG capsule Take 1 capsule (100 mg total) by mouth 2 (two) times daily as needed for mild constipation. 60 capsule 3  . ezetimibe-simvastatin (VYTORIN) 10-20 MG tablet Take 1/2 tablet by mouth once nightly to control cholesterol 15 tablet 3  . furosemide (LASIX) 20 MG tablet Take 1 tablet (20 mg total) by mouth 2 (two) times daily. For Hypertension 60 tablet 3  . losartan (COZAAR) 50 MG tablet Take 1  tablet (50 mg total) by mouth daily. for high blood pressure 30 tablet 3  . Loteprednol Etabonate (LOTEMAX) 0.5 % GEL PLACE 1 DROP IN THE LEFT EYE TWICE DAILY 5 g 1  . meloxicam (MOBIC) 15 MG tablet Take 1 tablet (15 mg total) by mouth as needed for pain. For foot and ankle pain 30 tablet 3  . metFORMIN (GLUCOPHAGE) 500 MG tablet Take One tablet by mouth twice daily to control glucose 60 tablet 3  . Nebivolol HCl (BYSTOLIC) 20 MG TABS Take 1 tablet (20 mg total) by mouth daily. For Blood Pressure 30 tablet 3  . nitroGLYCERIN (NITROSTAT) 0.4 MG SL tablet Place one under tongue for chest pain, up to 3 tablets 30 tablet 3  . polyethylene glycol powder (GLYCOLAX/MIRALAX) powder DISSOLVE 17 GRAMS (1 CAPFUL) INTO 8 OUNCES OF LIQUID AND DRINK EVERY DAY AS NEEDED FOR BOWELS 850 g 5  . sitaGLIPtin (JANUVIA) 100 MG tablet Take 1 tablet (100 mg total) by mouth daily. For Diabetes 30 tablet 3  . timolol (BETIMOL) 0.5 % ophthalmic solution INSTILL 1 DROP INTO THE LEFT EYE DAILY 10 mL 1   No current facility-administered medications for this visit.     Functional Status:  In your present state of health, do you have any difficulty performing the following activities: 04/20/2016  Hearing? Y  Comment Pt wears bilateral hearing aids  Vision? Y  Difficulty concentrating or making decisions? N  Walking or climbing stairs? N  Dressing  or bathing? N  Doing errands, shopping? Y  Comment Pt no longer drives vehicle.   Preparing Food and eating ? N  Using the Toilet? N  In the past six months, have you accidently leaked urine? Y  Comment Wears pads daily  Do you have problems with loss of bowel control? Y  Comment Wears pads daily  Managing your Medications? N  Managing your Finances? N  Housekeeping or managing your Housekeeping? N  Some recent data might be hidden    Fall/Depression Screening: Fall Risk  09/28/2016 09/08/2016 08/24/2016  Falls in the past year? No No No   PHQ 2/9 Scores 10/24/2016  04/20/2016 02/19/2016 06/24/2015 12/04/2014 10/31/2013 07/31/2013  PHQ - 2 Score 0 0 0 0 0 0 0    Assessment:  Daughter is requesting a home visit for mother for further evaluation  Plan:  Referral to complex case management   Gean Maidens BSN RN [facility] Care Management [phone]

## 2016-10-25 ENCOUNTER — Encounter: Payer: Self-pay | Admitting: *Deleted

## 2016-10-26 ENCOUNTER — Other Ambulatory Visit: Payer: Self-pay | Admitting: Internal Medicine

## 2016-10-26 DIAGNOSIS — K622 Anal prolapse: Secondary | ICD-10-CM

## 2016-10-26 DIAGNOSIS — K649 Unspecified hemorrhoids: Secondary | ICD-10-CM

## 2016-10-27 ENCOUNTER — Other Ambulatory Visit: Payer: Self-pay

## 2016-10-27 NOTE — Patient Outreach (Signed)
Care Coordination: New referral:  Placed call to daughter [name] to schedule a home visit. Offered home visit 11/03/2016.  Daughter accepted. Confirmed address.   PLAN: home visit 11/03/2016  Rowe PavyAmanda Laverle Pillard, RN, BSN, CEN [facility] Care Coordinator [phone]

## 2016-11-02 ENCOUNTER — Ambulatory Visit (INDEPENDENT_AMBULATORY_CARE_PROVIDER_SITE_OTHER): Payer: Medicare Other | Admitting: Nurse Practitioner

## 2016-11-02 ENCOUNTER — Encounter: Payer: Self-pay | Admitting: Nurse Practitioner

## 2016-11-02 VITALS — BP 100/60 | HR 62

## 2016-11-02 DIAGNOSIS — I5032 Chronic diastolic (congestive) heart failure: Secondary | ICD-10-CM

## 2016-11-02 DIAGNOSIS — I10 Essential (primary) hypertension: Secondary | ICD-10-CM | POA: Diagnosis not present

## 2016-11-02 DIAGNOSIS — I259 Chronic ischemic heart disease, unspecified: Secondary | ICD-10-CM

## 2016-11-02 NOTE — Patient Instructions (Addendum)
We will be checking the following labs today - NONE   Medication Instructions:    Continue with your current medicines.     Testing/Procedures To Be Arranged:  N/A  Follow-Up:   See me in about 6 months    Other Special Instructions:   N/A    If you need a refill on your cardiac medications before your next appointment, please call your pharmacy.   Call the [facility] office at [phone] if you have any questions, problems or concerns.      

## 2016-11-02 NOTE — Progress Notes (Signed)
CARDIOLOGY OFFICE NOTE  Date:  11/02/2016    [name] Date of Birth: Aug 17, 1927 Medical Record #[id number]  PCP:  Kirt Boys, DO  Cardiologist:  Kirt Boys  Chief Complaint  Patient presents with  . Coronary Artery Disease    6 month check - seen for Dr. Excell Seltzer    History of Present Illness: [name] is a 83 y.o. female who presents today for a follow up visit. Seen for Dr. Excell Seltzer. She is a former patient of Dr. Ronnald Nian.   She has multiple issues which include CAD, remote PCI, BMS to the RCA and DES to the DX in January of 2012. She has had longstanding diabetes, HTN, HLD, diastolic heart failure with EF 55 to 60% per cath back in 2012 but has been as low as 35%, PVD (followed by Dr. Darrick Penna) with bilateral common iliac stenting in October of2015, gout, CKD and chronic anemia. She has LBBB.   I have known [name] for over 20 years - there is always an issue with her social situation, her medicines or her doctors in some shape or fashion that she is not happy about.   Last seen 6 months ago - she was very confused - refused to let her family come back with her - mixed up on who brought her to the visit - still very hard to believe really anything she says in regards to her home situation. I had a talk with her daughter [name] - they were considering having her declared incompetent.   Comes in today. Here with [name] today. Crying out in the lobby - "please help me". [name] tells me that they were out playing Bingo earlier today - then went home and took a nap - after waking up has complained of her bottom hurting. Can't stop crying "help me, help me". Not really able to walk. In a wheelchair. [name] says that she and [name] have decided to leave her at home with her cats - they feel like she will really deteriorate if taken away from her cats. The man that previously lived with them has finally left - after 15 years. She says her heart is fine. No chest pain. Hard to  say if she is taking her medicines correctly. Very sad situation.   Past Medical History:  Diagnosis Date  . Anemia   . Carotid artery occlusion   . Carotid bruit   . Chronic kidney disease (CKD), stage III (moderate)   . Coronary artery disease    Remote PCI. Had BMS to RCA and DES stent to Diagonal in Jan. 2012   . Diastolic heart failure    EF 55 to 60% per cath in Jan 2012  . DVT (deep venous thrombosis) (HCC)   . Gout   . Hyperlipidemia   . Hypertension   . Ischemic cardiomyopathy    with prior EF of 35%  . NSTEMI (non-ST elevated myocardial infarction) [facility]) Jan 2012   with PCI to RCA and DX per Dr. Excell Seltzer  . Obesity   . Pneumonia 2016  . Proteinuria   . Stroke [facility]) 2013   ?  mini    . TIA (transient ischemic attack)   . Type II diabetes mellitus (HCC)    long standing    Past Surgical History:  Procedure Laterality Date  . ABDOMINAL AORTAGRAM N/A 01/17/2014   Procedure: ABDOMINAL Ronny Flurry;  Surgeon: Sherren Kerns, MD;  Location: [facility] CATH LAB;  Service: Cardiovascular;  Laterality: N/A;  . ANKLE FUSION Bilateral   . APPENDECTOMY    . CATARACT EXTRACTION, BILATERAL Bilateral   . CORONARY ANGIOPLASTY WITH STENT PLACEMENT  04/05/2010   distal RCA  . FRACTURE SURGERY Right 2005   bilateral ankles   . TONSILLECTOMY       Medications: Current Meds  Medication Sig  . aspirin EC 81 MG tablet Take 81 mg by mouth daily.  . cloNIDine (CATAPRES) 0.1 MG tablet Take 1 tablet (0.1 mg total) by mouth daily. For high blood pressure  . docusate sodium (COLACE) 100 MG capsule Take 1 capsule (100 mg total) by mouth 2 (two) times daily as needed for mild constipation.  Marland Kitchen. ezetimibe-simvastatin (VYTORIN) 10-20 MG tablet Take 1/2 tablet by mouth once nightly to control cholesterol  . furosemide (LASIX) 20 MG tablet Take 1 tablet (20 mg total) by mouth 2 (two) times daily. For Hypertension  . losartan (COZAAR) 50 MG tablet Take 1 tablet (50 mg total) by mouth daily. for high  blood pressure  . Loteprednol Etabonate (LOTEMAX) 0.5 % GEL PLACE 1 DROP IN THE LEFT EYE TWICE DAILY  . meloxicam (MOBIC) 15 MG tablet Take 1 tablet (15 mg total) by mouth as needed for pain. For foot and ankle pain  . metFORMIN (GLUCOPHAGE) 500 MG tablet Take One tablet by mouth twice daily to control glucose  . Nebivolol HCl (BYSTOLIC) 20 MG TABS Take 1 tablet (20 mg total) by mouth daily. For Blood Pressure  . nitroGLYCERIN (NITROSTAT) 0.4 MG SL tablet Place one under tongue for chest pain, up to 3 tablets  . polyethylene glycol powder (GLYCOLAX/MIRALAX) powder DISSOLVE 17 GRAMS (1 CAPFUL) INTO 8 OUNCES OF LIQUID AND DRINK EVERY DAY AS NEEDED FOR BOWELS  . sitaGLIPtin (JANUVIA) 100 MG tablet Take 1 tablet (100 mg total) by mouth daily. For Diabetes  . timolol (BETIMOL) 0.5 % ophthalmic solution INSTILL 1 DROP INTO THE LEFT EYE DAILY     Allergies: Allergies  Allergen Reactions  . Namzaric [Memantine Hcl-Donepezil Hcl] Nausea Only    confusion  . Percocet [Oxycodone-Acetaminophen] Other (See Comments)    loosy goosy  . Sulfa Antibiotics Other (See Comments)    Tongue swells  . Adhesive [Tape] Other (See Comments)    Tears skin.  Please use "paper" tape  . Metformin And Related Other (See Comments)    Abnormal Kidney Functions   . Tradjenta [Linagliptin]     Low back ache, resolved once medication stopped   . Penicillins Hives    Tolerates Ceftriaxone, Has patient had a PCN reaction causing immediate rash, facial/tongue/throat swelling, SOB or lightheadedness with hypotension: Unknown Has patient had a PCN reaction causing severe rash involving mucus membranes or skin necrosis: No Has patient had a PCN reaction that required hospitalization No Has patient had a PCN reaction occurring within the last 10 years: No If all of the above answers are "NO", then may proceed with Cephalosporin use.     Social History: The patient  reports that she has quit smoking. She has never used  smokeless tobacco. She reports that she does not drink alcohol or use drugs.   Family History: The patient's family history is not on file.   Review of Systems: Please see the history of present illness.   Otherwise, the review of systems is positive for none.   All other systems are reviewed and negative.   Physical Exam: VS:  BP 100/60 (BP Location: Left Arm, Patient Position: Sitting, Cuff Size: Normal)  Pulse 62   SpO2 100%  .  BMI There is no height or weight on file to calculate BMI.  Wt Readings from Last 3 Encounters:  10/04/16 130 lb (59 kg)  10/01/16 130 lb (59 kg)  09/08/16 129 lb 6.4 oz (58.7 kg)    General: Elderly female. Looks chronically ill. Crying. She does calm down with talking. Seems to be more repetitive today. In no acute distress.   HEENT: Normal.  Neck: Supple, no JVD, carotid bruits, or masses noted.  Cardiac: Regular rate and rhythm. No murmurs, rubs, or gallops. No edema.  Respiratory:  Lungs are clear to auscultation bilaterally with normal work of breathing.  GI: Soft and nontender.  MS: No deformity or atrophy. Gait not tested. Skin: Warm and dry. Color is normal.  Neuro:  Strength and sensation are intact and no gross focal deficits noted.  Psych: Alert, appropriate and with normal affect.   LABORATORY DATA:  EKG:  EKG is not ordered today.  Lab Results  Component Value Date   WBC 3.8 (L) 10/01/2016   HGB 10.1 (L) 10/01/2016   HCT 30.8 (L) 10/01/2016   PLT 198 10/01/2016   GLUCOSE 145 (H) 10/01/2016   CHOL 130 08/29/2016   TRIG 196 (H) 08/29/2016   HDL 39 (L) 08/29/2016   LDLCALC 52 08/29/2016   ALT 9 (L) 10/01/2016   AST 14 (L) 10/01/2016   NA 139 10/01/2016   K 4.3 10/01/2016   CL 109 10/01/2016   CREATININE 2.16 (H) 10/01/2016   BUN 44 (H) 10/01/2016   CO2 22 10/01/2016   TSH 0.78 09/08/2016   INR 1.09 04/15/2010   HGBA1C 7.1 (H) 08/29/2016   MICROALBUR 47.5 08/24/2016     BNP (last 3 results) No results for input(s):  BNP in the last 8760 hours.  ProBNP (last 3 results) No results for input(s): PROBNP in the last 8760 hours.   Other Studies Reviewed Today:   Assessment/Plan: 1. CAD - no chest pain. Would favor conservative management. She is clearly continuing to decline.   2. HTN - BP fine here today  - still unclear if she is even taking her medicines correctly. She is quite resistant to letting her family help.   3. HLD - on statin therapy  4. PVD - followed by [facility] with Dr. Darrick Penna   5. Diastolic HF - not on the best regimen but she seems ok from this standpoint. Would favor conservative management.    6. Dementia -progressive.   7. Rectal pain - sounds like hemorrhoids. She has cream at home to use. Would try a sitz bath - I really don't have much to offer on this issue.     Current medicines are reviewed with the patient today.  The patient does not have concerns regarding medicines other than what has been noted above.  The following changes have been made:  See above.  Labs/ tests ordered today include:   No orders of the defined types were placed in this encounter.    Disposition:   FU with me in about 6 months. Overall prognosis is quite tenuous.   Patient is agreeable to this plan and will call if any problems develop in the interim.   SignedNorma Fredrickson, NP  11/02/2016 3:26 PM  [facility] [address] [place], [place]  [address] Phone: [phone] Fax: [phone]

## 2016-11-03 ENCOUNTER — Other Ambulatory Visit: Payer: Self-pay

## 2016-11-03 NOTE — Patient Outreach (Signed)
[facility]) Care Management   11/03/2016  [name] [birth year] [id number]  [name] is an 83 y.o. female  Subjective:  Patient reports he biggest concern is her "rectal issues"  Patient reports that she bother frequently by her rectal issues.  Reports that MD will not do surgery.  Patient reports that she is able to take care of herself. Reports that she is able to take her own medications.  Reports that she uses [facility] for medications. Patient reports that she loves her cats.  Patient reports decreased appetite.  Reports that she sleeps well with her cats.  Patient reports that she lives her with her daughter [name].    Objective:  Unable to remember year and month.  Able to verbalize her own date of birth and address.  Able to ambulate without difficulty.  * cats in the home.  Vitals:   11/03/16 1158  BP: 138/64  Pulse: 62  Resp: 18  SpO2: 96%  Weight: 130 lb (59 kg)  Height: 1.524 m (5')   Review of Systems  Constitutional: Negative.   HENT: Positive for hearing loss.   Eyes:       Use eye drops daily and wears glasses  Respiratory: Negative.   Cardiovascular: Negative.   Gastrointestinal:       Reports rectal problems  Genitourinary: Positive for frequency.  Musculoskeletal: Negative.   Skin: Negative.   Neurological: Negative.   Endo/Heme/Allergies: Bruises/bleeds easily.  Psychiatric/Behavioral: Positive for memory loss.    Physical Exam  Constitutional: She appears well-developed and well-nourished.  Cardiovascular: Normal rate, normal heart sounds and intact distal pulses.   Respiratory: Effort normal and breath sounds normal.  GI: Soft. Bowel sounds are normal.  Musculoskeletal: Normal range of motion. She exhibits edema.  Trace edema  Neurological: She is alert.  Unable to verbalize children's date of birth, unable to verbalize current year and month.   Skin: Skin is warm and dry.  Feet without lesions or sores   Psychiatric: She has a normal mood and affect. Her behavior is normal. Judgment and thought content normal.    Encounter Medications:   Outpatient Encounter Prescriptions as of 11/03/2016  Medication Sig Note  . cloNIDine (CATAPRES) 0.1 MG tablet Take 1 tablet (0.1 mg total) by mouth daily. For high blood pressure   . docusate sodium (COLACE) 100 MG capsule Take 1 capsule (100 mg total) by mouth 2 (two) times daily as needed for mild constipation.   Marland Kitchen ezetimibe-simvastatin (VYTORIN) 10-20 MG tablet Take 1/2 tablet by mouth once nightly to control cholesterol   . furosemide (LASIX) 20 MG tablet Take 1 tablet (20 mg total) by mouth 2 (two) times daily. For Hypertension 11/03/2016: Takes one a day.  . losartan (COZAAR) 50 MG tablet Take 1 tablet (50 mg total) by mouth daily. for high blood pressure 11/03/2016: Waiting for delivery  . Loteprednol Etabonate (LOTEMAX) 0.5 % GEL PLACE 1 DROP IN THE LEFT EYE TWICE DAILY   . meloxicam (MOBIC) 15 MG tablet Take 1 tablet (15 mg total) by mouth as needed for pain. For foot and ankle pain   . metFORMIN (GLUCOPHAGE) 500 MG tablet Take One tablet by mouth twice daily to control glucose 11/03/2016: Currently waiting delivery  . Nebivolol HCl (BYSTOLIC) 20 MG TABS Take 1 tablet (20 mg total) by mouth daily. For Blood Pressure   . nitroGLYCERIN (NITROSTAT) 0.4 MG SL tablet Place one under tongue for chest pain, up to 3 tablets   .  polyethylene glycol powder (GLYCOLAX/MIRALAX) powder DISSOLVE 17 GRAMS (1 CAPFUL) INTO 8 OUNCES OF LIQUID AND DRINK EVERY DAY AS NEEDED FOR BOWELS   . sitaGLIPtin (JANUVIA) 100 MG tablet Take 1 tablet (100 mg total) by mouth daily. For Diabetes   . timolol (BETIMOL) 0.5 % ophthalmic solution INSTILL 1 DROP INTO THE LEFT EYE DAILY   . aspirin EC 81 MG tablet Take 81 mg by mouth daily.    No facility-administered encounter medications on file as of 11/03/2016.     Functional Status:   In your present state of health, do you have any  difficulty performing the following activities: 11/03/2016 04/20/2016  Hearing? Malvin Johns  Comment hearing aids in both ears Pt wears bilateral hearing aids  Vision? N Y  Difficulty concentrating or making decisions? N N  Comment Reports no, unable to verbalize month and year.  -  Walking or climbing stairs? N N  Dressing or bathing? N N  Doing errands, shopping? Y Y  Comment does not drive. Pt no longer drives vehicle.   Preparing Food and eating ? N N  Using the Toilet? N N  In the past six months, have you accidently leaked urine? N Y  Comment - Wears pads daily  Do you have problems with loss of bowel control? N Y  Comment - Wears pads daily  Managing your Medications? N N  Managing your Finances? N N  Housekeeping or managing your Housekeeping? N N  Some recent data might be hidden    Fall/Depression Screening:    Fall Risk  11/03/2016 09/28/2016 09/08/2016  Falls in the past year? No No No   PHQ 2/9 Scores 11/03/2016 10/24/2016 04/20/2016 02/19/2016 06/24/2015 12/04/2014 10/31/2013  PHQ - 2 Score 0 0 0 0 0 0 0    Assessment:  (1) Reviewed [facility] program. Provided new patient packet, magnet and my contact card.  Reviewed consent and patient signed. (2) complains of rectal pain. (3) reports she would like caregiver services or might be interested in PACE program. Patient reports that she had a service that supplied a caregiver for 2-3 hours per day for 5 days per week. Reports that this service stopped 6 months ago.  Patient is unable to tell me what company or why services stopped. (4) Reports taking medications as prescribed. When reviewing medications noted that patient is taking lasix once a day and not taking her aspirin. Encouraged patient to take her medications as prescribed. Reviewed med box and patient is not interested in changed the " system that works for me" (5) Reports decreased appetite. Drinks ensure daily. (6) DM: patient declines to be  Monitoring CBG or following a DM diet.  Reports taking her pills for DM. (7) CHF: patient declines weighing daily. Trace edema teo lower legs noted. Encouraged patient to take her lasix as prescribed ( twice a day) (8) Memory concerns:  Patient able to verbalize date of birth, address, age, name. Unable to verbalize month and year.  Appears able to self manage at home with assistance with IADLS. (9) no advanced directive.  Plan:  (1) consent scanned into chart. (2) reviewed with patient to discuss treatment options. Reviewed avoiding constipation and heavy lifting as well. (3) referral placed for [facility] social worker. (4) Reviewed with patient all medications and encouraged patient to take all medications as prescribed. (5) encouraged patient to eat on a regular basis and if she skips a meal to drink a meal supplement. Reviewed importance of maintaining weight to help  avoid infections and stay healthy. (6) Reviewed A1c in medical record of 7.1.  Encouraged patient to continue to take her medications as prescribed. (7) Reviewed importance of taking her lasix twice a day. Reviewed signs of fluid overload and when to call MD. (8) [facility].  (9) declines interest in completing advanced directives.  Care planning and goal setting during home visit . Primary goal is caregiver services.  Will send this note to MD. Will send barrier letter to MD.   Will leave case open for now until social work services have started.  No nursing needs identified at this time.  [facility] CM Care Plan Problem One     Most Recent Value  Care Plan Problem One  Patient requested caregiver services or alternative day program.  Role Documenting the Problem One  Care Management Coordinator  [facility] CM Short Term Goal #1   Patient and or daughter will report baing active [facility] for resoureces available to assist patient in the next 2 weeks.  [facility] CM Short Term Goal #1 Start Date  11/03/16  Interventions for Short Term Goal #1  placed  [facility].  Reviewed plan of care with patient and daughter.     Rowe PavyAmanda Hae Ahlers, RN, BSN, CEN [facility] Care Coordinator [phone]

## 2016-11-08 ENCOUNTER — Ambulatory Visit: Payer: Medicare Other | Admitting: Internal Medicine

## 2016-11-09 ENCOUNTER — Other Ambulatory Visit: Payer: Self-pay | Admitting: *Deleted

## 2016-11-10 ENCOUNTER — Other Ambulatory Visit: Payer: Self-pay | Admitting: *Deleted

## 2016-11-10 NOTE — Patient Outreach (Signed)
[facility]) Care Management  11/10/2016  SOMALIA SPIZZIRRI December 16, 1927 [id number]   CSW spoke with patient by phone today- confirmed her identity and introduced self, role and reason for calling. " I really just need someone to help me with bathing".  CSW advised patient of conversation with daughter, [name], and invited her to meet with me at our [facility] office next week.  She is agreeable and [name] has confirmed she can bring her to the office. Plans confirmed for visit 11/17/16 at [facility] office.     [name], MSW, LCSW Clinical Social Worker  [facility] [employer] [phone]

## 2016-11-11 ENCOUNTER — Ambulatory Visit: Payer: Self-pay | Admitting: *Deleted

## 2016-11-15 ENCOUNTER — Ambulatory Visit: Payer: Self-pay | Admitting: Internal Medicine

## 2016-11-16 ENCOUNTER — Other Ambulatory Visit: Payer: Self-pay

## 2016-11-16 NOTE — Patient Outreach (Signed)
Telephone assessment:  Placed call to patient who answered. Reports that she is doing well. Reports no new problems. [facility] and patient remembers appointment.   Denies any new concerns today. Reports taking her medications as prescribed.  PLAN: Will close to nursing as patient only has social work issues at this time. Will send an in basket to [facility] to provide update.   Rowe PavyAmanda Sharlon Pfohl, RN, BSN, CEN [facility] Care Coordinator [phone]

## 2016-11-17 ENCOUNTER — Ambulatory Visit: Payer: Self-pay

## 2016-11-17 ENCOUNTER — Encounter: Payer: Self-pay | Admitting: *Deleted

## 2016-11-17 ENCOUNTER — Other Ambulatory Visit: Payer: Self-pay | Admitting: *Deleted

## 2016-11-17 IMAGING — CR DG CHEST 2V
2 series · 2 of 2 positions shown · non-contrast
Comparison: 06/12/2015

CLINICAL DATA: Awoke this morning with acute dyspnea and
lightheadedness.

EXAM:
CHEST  2 VIEW

[w chest pa]
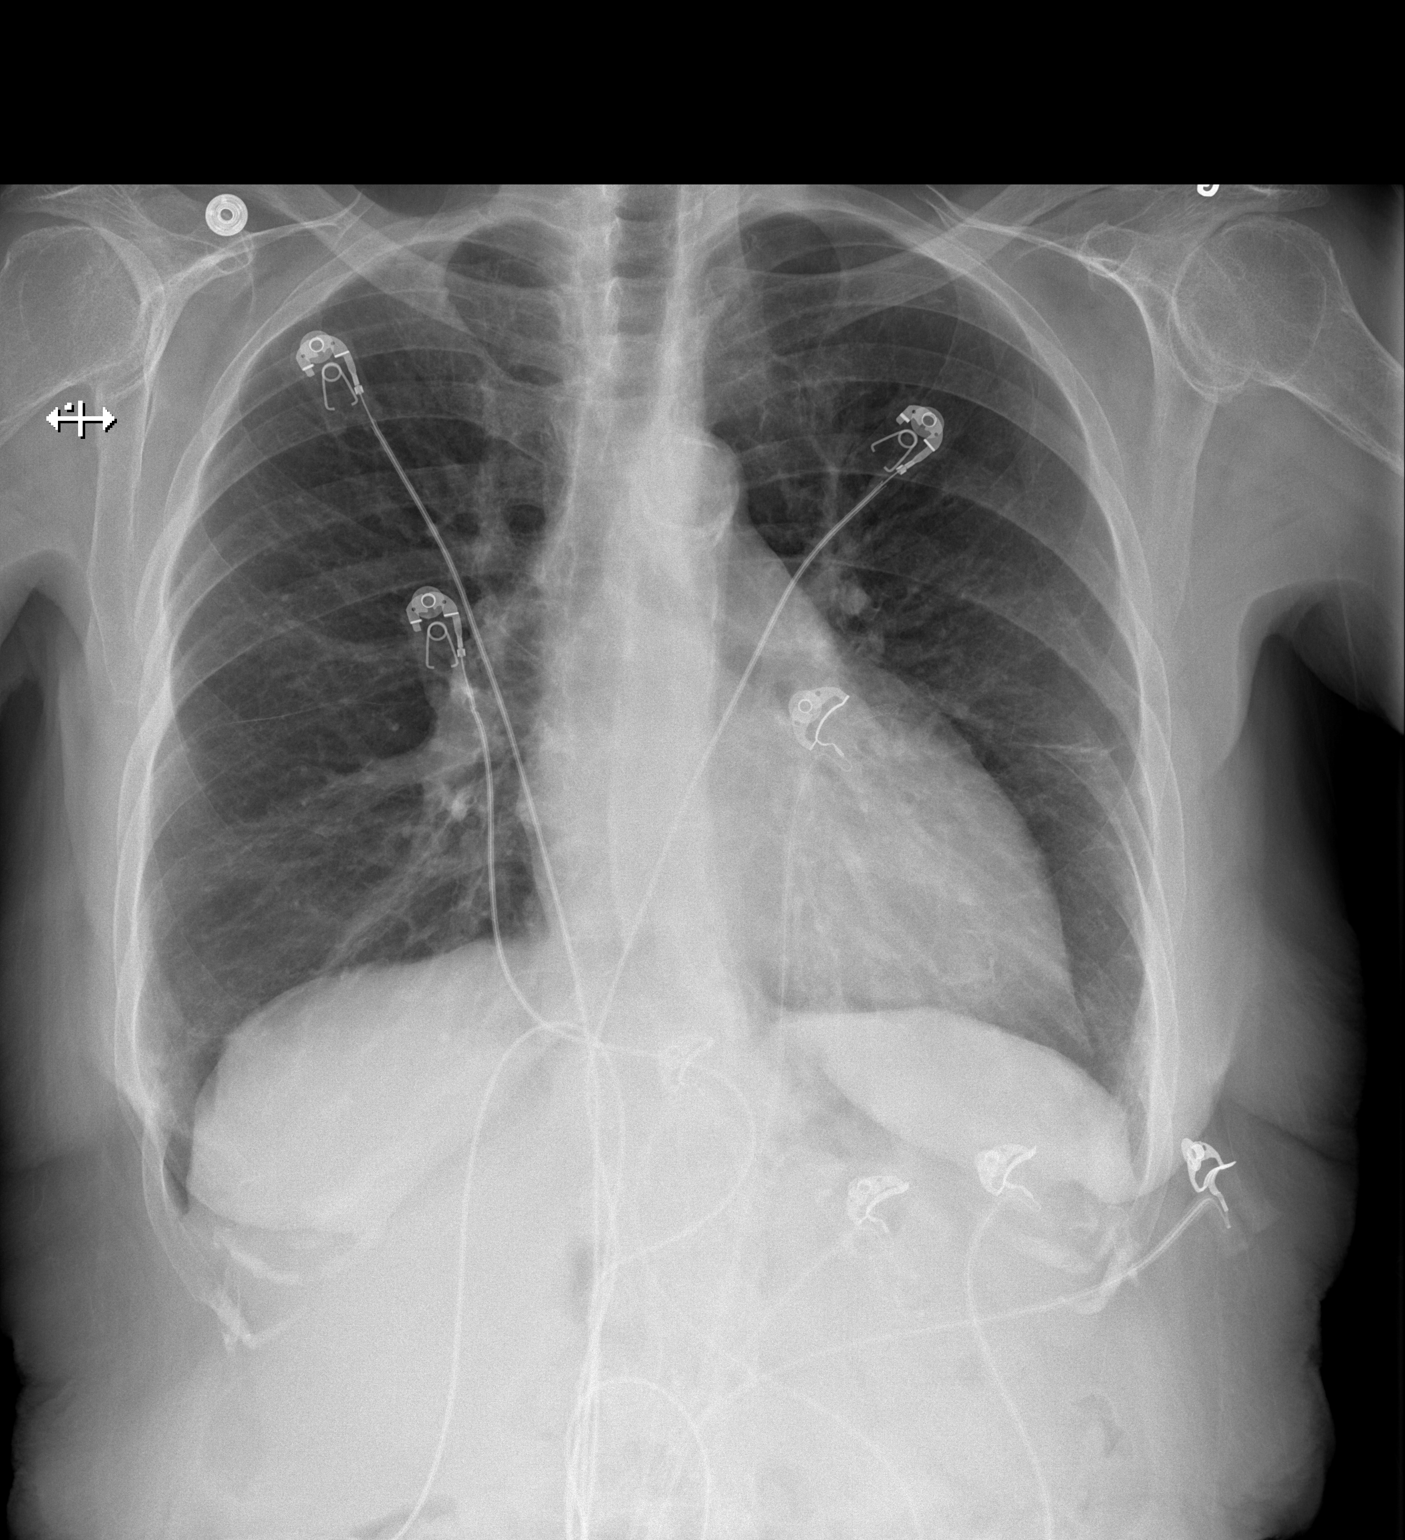

[w chest lat]
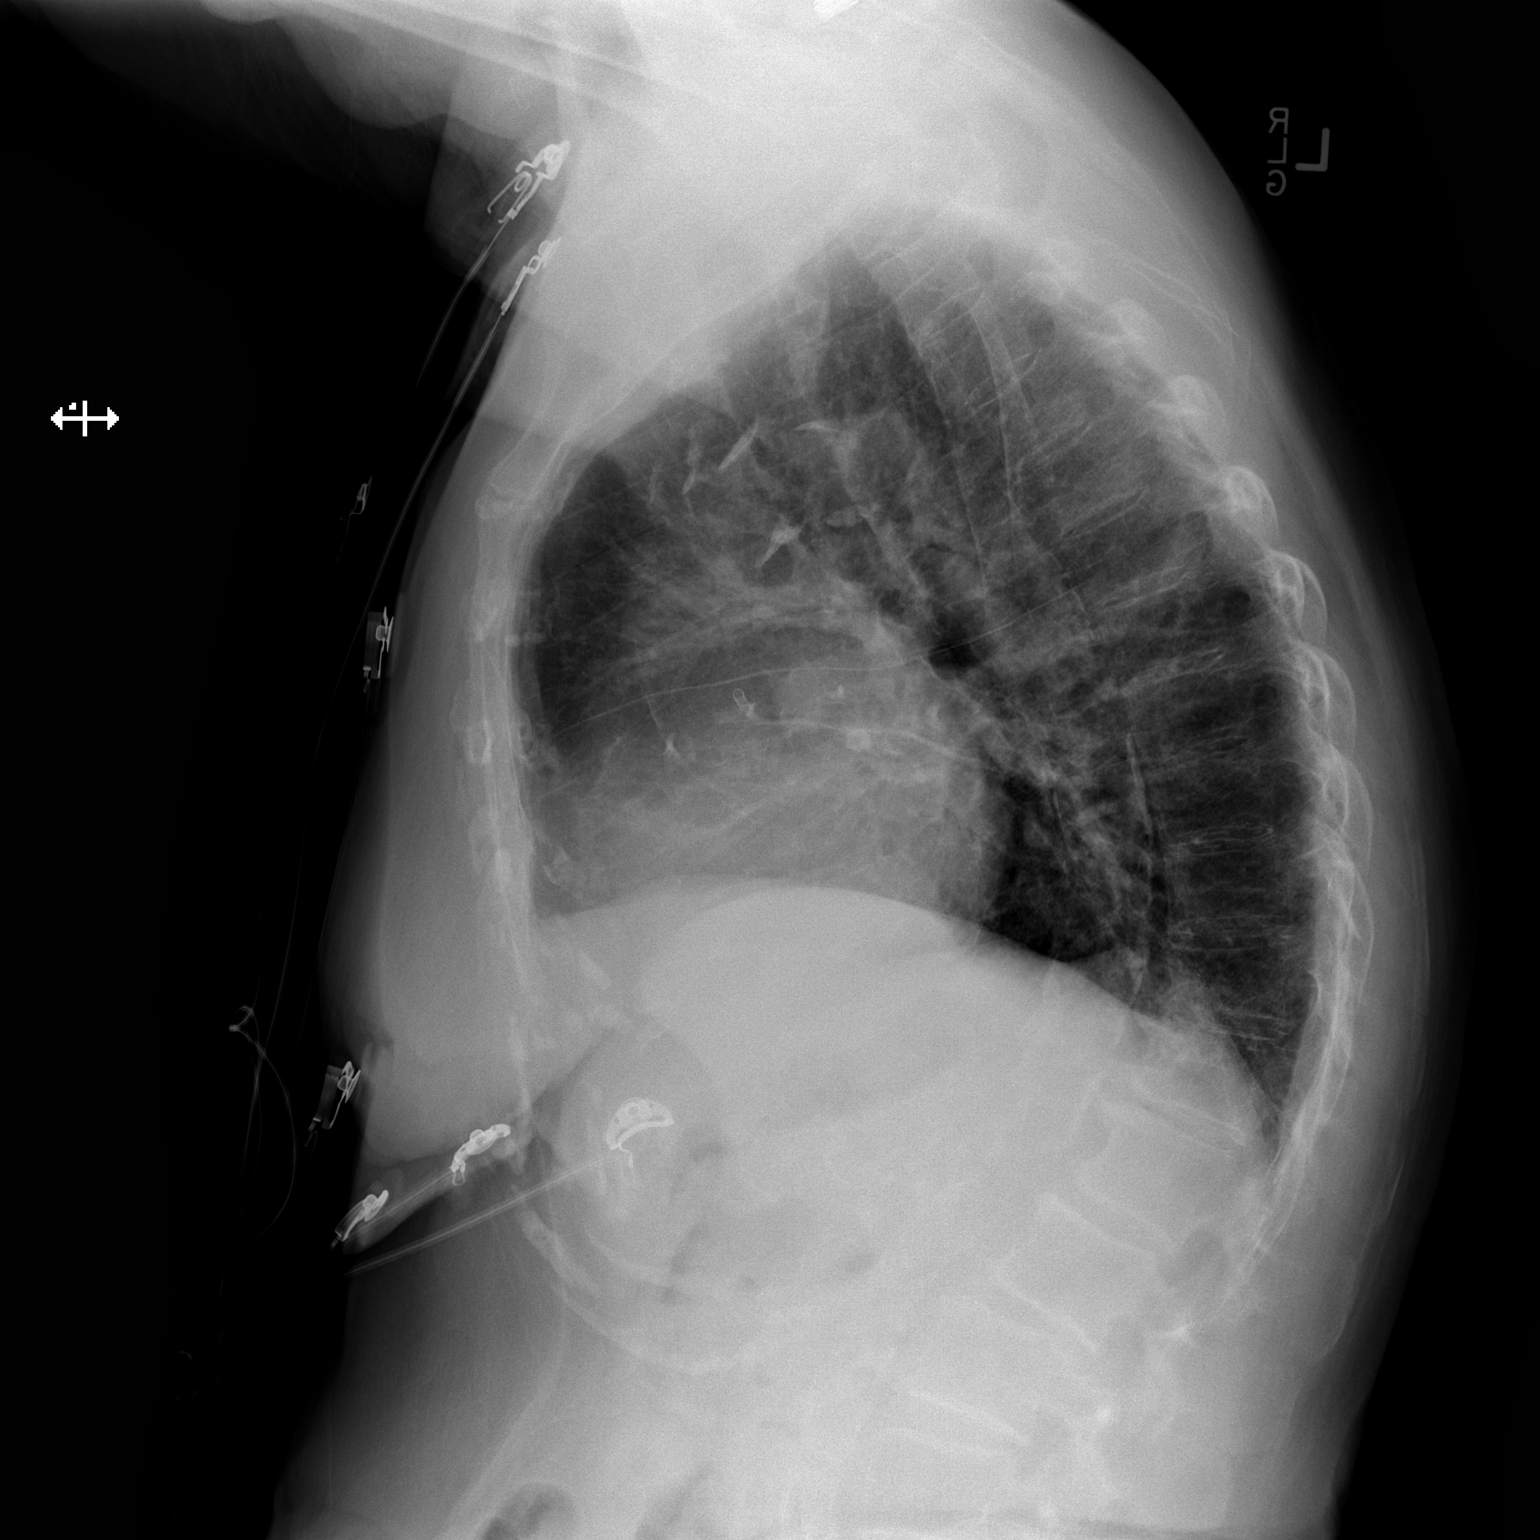

[2 of 2 positions shown; findings below may reference images not displayed]

FINDINGS: There is mild cardiomegaly. Lungs are clear. There is no pleural
effusion. Pulmonary vasculature is normal hilar and mediastinal
contours are unremarkable and unchanged.
IMPRESSION: Mild cardiomegaly.  No acute cardiopulmonary findings.

## 2016-11-18 ENCOUNTER — Other Ambulatory Visit: Payer: Self-pay | Admitting: *Deleted
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
## 2016-11-19 NOTE — Patient Outreach (Signed)
[facility]) Care Management  [facility] Social Work  11/16/2016  [name] 1927/06/15 [id number]  Subjective:  Patient arrived at [facility] office for [place] with daughter, [name].   Objective: CSW to assist patient with commuity based resources to aide in her well-being, quality of life and overall safety/needs.    Current Medications:  Current Outpatient Prescriptions  Medication Sig Dispense Refill  . aspirin EC 81 MG tablet Take 81 mg by mouth daily.    . cloNIDine (CATAPRES) 0.1 MG tablet Take 1 tablet (0.1 mg total) by mouth daily. For high blood pressure 30 tablet 3  . docusate sodium (COLACE) 100 MG capsule Take 1 capsule (100 mg total) by mouth 2 (two) times daily as needed for mild constipation. 60 capsule 3  . ezetimibe-simvastatin (VYTORIN) 10-20 MG tablet Take 1/2 tablet by mouth once nightly to control cholesterol 15 tablet 3  . furosemide (LASIX) 20 MG tablet Take 1 tablet (20 mg total) by mouth 2 (two) times daily. For Hypertension 60 tablet 3  . losartan (COZAAR) 50 MG tablet Take 1 tablet (50 mg total) by mouth daily. for high blood pressure 30 tablet 3  . Loteprednol Etabonate (LOTEMAX) 0.5 % GEL PLACE 1 DROP IN THE LEFT EYE TWICE DAILY 5 g 1  . meloxicam (MOBIC) 15 MG tablet Take 1 tablet (15 mg total) by mouth as needed for pain. For foot and ankle pain 30 tablet 3  . metFORMIN (GLUCOPHAGE) 500 MG tablet Take One tablet by mouth twice daily to control glucose 60 tablet 3  . Nebivolol HCl (BYSTOLIC) 20 MG TABS Take 1 tablet (20 mg total) by mouth daily. For Blood Pressure 30 tablet 3  . nitroGLYCERIN (NITROSTAT) 0.4 MG SL tablet Place one under tongue for chest pain, up to 3 tablets 30 tablet 3  . polyethylene glycol powder (GLYCOLAX/MIRALAX) powder DISSOLVE 17 GRAMS (1 CAPFUL) INTO 8 OUNCES OF LIQUID AND DRINK EVERY DAY AS NEEDED FOR BOWELS 850 g 5  . sitaGLIPtin (JANUVIA) 100 MG tablet Take 1 tablet (100 mg total) by mouth daily. For  Diabetes 30 tablet 3  . timolol (BETIMOL) 0.5 % ophthalmic solution INSTILL 1 DROP INTO THE LEFT EYE DAILY 10 mL 1   No current facility-administered medications for this visit.     Functional Status:  In your present state of health, do you have any difficulty performing the following activities: 11/17/2016 11/03/2016  Hearing? - Y  Comment - hearing aids in both ears  Vision? - N  Difficulty concentrating or making decisions? - N  Comment - Reports no, unable to verbalize month and year.   Walking or climbing stairs? - N  Dressing or bathing? - N  Doing errands, shopping? - Y  Comment - does not drive.  Preparing Food and eating ? Y N  Using the Toilet? N N  In the past six months, have you accidently leaked urine? N N  Comment - -  Do you have problems with loss of bowel control? N N  Comment - -  Managing your Medications? N N  Managing your Finances? N N  Housekeeping or managing your Housekeeping? N N  Some recent data might be hidden    Fall/Depression Screening:  Fall Risk  11/17/2016 11/03/2016 09/28/2016  Falls in the past year? No No No   PHQ 2/9 Scores 11/17/2016 11/03/2016 10/24/2016 04/20/2016 02/19/2016 06/24/2015 12/04/2014  PHQ - 2 Score 0 0 0 0 0 0 0  Assessment:  CSW met with patient and her daughter in the [facility] office. Patient presents oriented to self, DOB, home address but is not able to give the correct year, season or county of residence. She is able to participate and answer assessment questions, and is quick to respond often with, "I take care of myself".  She shared with CSW that she has phone conversations with Daisy Floro and that she is "leaving my house to him'. She also reports plans to have "Dr Arelia Sneddon" to be her 18- she states he is a long time friend from church.  CSW discussed possible community resources that may be of benefit to her in the home including meals on wheels, SCAT transportation, personal care assistance as well as to consider PACE  program.  CSW provided patient and daughter with Advance Directive packet, PACE information and will send additional resources via mail to daughter.      [facility] CM Care Plan Problem One     Most Recent Value  Care Plan Problem One  Patient can benefit from community resource support in the home due to cognition, physical and financial needs.  Role Documenting the Problem One  Clinical Social Worker  Care Plan for Problem One  Active  [facility] CM Short Term Goal #1   Patient and family will be linked with community resources to review and consider in the next 30 days.  [facility] CM Short Term Goal #1 Start Date  11/16/16  Interventions for Short Term Goal #1  CSW discussed and will provide resources to consider .  [facility] CM Short Term Goal #2   Patient and family will report progress in completion of Adv. DIirectives in the next 30 days.  [facility] CM Short Term Goal #2 Start Date  11/16/16  Interventions for Short Term Goal #2  CSW educated and provided Advance Directive packet for completion.       Plan:   CSW will mail resources to daughter and plan follow up call next week.   Eduard Clos, MSW, [place] Worker  [place] [phone]

## 2016-11-22 ENCOUNTER — Other Ambulatory Visit: Payer: Self-pay | Admitting: Internal Medicine

## 2016-11-22 DIAGNOSIS — K649 Unspecified hemorrhoids: Secondary | ICD-10-CM

## 2016-11-22 DIAGNOSIS — K622 Anal prolapse: Secondary | ICD-10-CM

## 2016-11-22 NOTE — Patient Outreach (Signed)
Request received from Janet Caldwell, LCSW to mail patient personal care resources.  Information mailed today. 

## 2016-11-23 ENCOUNTER — Other Ambulatory Visit: Payer: Self-pay | Admitting: *Deleted

## 2016-11-23 NOTE — Patient Outreach (Signed)
[facility]) Care Management  11/23/2016  [name] 1927-05-06 [id number]   CSW contacted patient by phone- she requests that I speak with her daughter. CSW contacted Arna Mediciora and left message for callback. CSW will call again next week if no return call is received for follow up on resources mailed and how patient is doing at home.     Reece LevyJanet Donney Caraveo, MSW, LCSW Clinical Social Worker  [facility] [phone]

## 2016-11-25 ENCOUNTER — Ambulatory Visit: Payer: Self-pay | Admitting: *Deleted

## 2016-11-26 ENCOUNTER — Emergency Department (HOSPITAL_COMMUNITY)
Admission: EM | Admit: 2016-11-26 | Discharge: 2016-11-26 | Disposition: A | Discharge status: Home / Self Care | Payer: Medicare Other | Attending: Emergency Medicine | Admitting: Emergency Medicine

## 2016-11-26 ENCOUNTER — Encounter (HOSPITAL_COMMUNITY): Payer: Self-pay | Admitting: Emergency Medicine

## 2016-11-26 ENCOUNTER — Emergency Department (HOSPITAL_COMMUNITY): Payer: Medicare Other

## 2016-11-26 DIAGNOSIS — Z7984 Long term (current) use of oral hypoglycemic drugs: Secondary | ICD-10-CM | POA: Insufficient documentation

## 2016-11-26 DIAGNOSIS — K5901 Slow transit constipation: Secondary | ICD-10-CM | POA: Diagnosis not present

## 2016-11-26 DIAGNOSIS — N3090 Cystitis, unspecified without hematuria: Secondary | ICD-10-CM | POA: Diagnosis not present

## 2016-11-26 DIAGNOSIS — Z79899 Other long term (current) drug therapy: Secondary | ICD-10-CM | POA: Insufficient documentation

## 2016-11-26 DIAGNOSIS — Z7982 Long term (current) use of aspirin: Secondary | ICD-10-CM | POA: Diagnosis not present

## 2016-11-26 DIAGNOSIS — Z955 Presence of coronary angioplasty implant and graft: Secondary | ICD-10-CM | POA: Insufficient documentation

## 2016-11-26 DIAGNOSIS — I130 Hypertensive heart and chronic kidney disease with heart failure and stage 1 through stage 4 chronic kidney disease, or unspecified chronic kidney disease: Secondary | ICD-10-CM | POA: Diagnosis not present

## 2016-11-26 DIAGNOSIS — E1122 Type 2 diabetes mellitus with diabetic chronic kidney disease: Secondary | ICD-10-CM | POA: Diagnosis not present

## 2016-11-26 DIAGNOSIS — I2510 Atherosclerotic heart disease of native coronary artery without angina pectoris: Secondary | ICD-10-CM | POA: Insufficient documentation

## 2016-11-26 DIAGNOSIS — Z87891 Personal history of nicotine dependence: Secondary | ICD-10-CM | POA: Diagnosis not present

## 2016-11-26 DIAGNOSIS — N183 Chronic kidney disease, stage 3 (moderate): Secondary | ICD-10-CM | POA: Diagnosis not present

## 2016-11-26 DIAGNOSIS — K6289 Other specified diseases of anus and rectum: Secondary | ICD-10-CM | POA: Diagnosis not present

## 2016-11-26 DIAGNOSIS — I5032 Chronic diastolic (congestive) heart failure: Secondary | ICD-10-CM | POA: Diagnosis not present

## 2016-11-26 LAB — CBC WITH DIFFERENTIAL/PLATELET
BASOS PCT: 0 %
Basophils Absolute: 0 10*3/uL (ref 0.0–0.1)
Eosinophils Absolute: 0.1 10*3/uL (ref 0.0–0.7)
Eosinophils Relative: 2 %
HEMATOCRIT: 37 % (ref 36.0–46.0)
Hemoglobin: 12 g/dL (ref 12.0–15.0)
LYMPHS PCT: 28 %
Lymphs Abs: 1.4 10*3/uL (ref 0.7–4.0)
MCH: 30 pg (ref 26.0–34.0)
MCHC: 32.4 g/dL (ref 30.0–36.0)
MCV: 92.5 fL (ref 78.0–100.0)
MONO ABS: 0.5 10*3/uL (ref 0.1–1.0)
Monocytes Relative: 10 %
NEUTROS ABS: 3 10*3/uL (ref 1.7–7.7)
Neutrophils Relative %: 60 %
Platelets: 226 10*3/uL (ref 150–400)
RBC: 4 MIL/uL (ref 3.87–5.11)
RDW: 14.1 % (ref 11.5–15.5)
WBC: 5 10*3/uL (ref 4.0–10.5)

## 2016-11-26 LAB — LIPASE, BLOOD: LIPASE: 18 U/L (ref 11–51)

## 2016-11-26 LAB — COMPREHENSIVE METABOLIC PANEL
ALBUMIN: 3.6 g/dL (ref 3.5–5.0)
ALK PHOS: 70 U/L (ref 38–126)
ALT: 9 U/L — ABNORMAL LOW (ref 14–54)
ANION GAP: 9 (ref 5–15)
AST: 16 U/L (ref 15–41)
BUN: 45 mg/dL — AB (ref 6–20)
CALCIUM: 9 mg/dL (ref 8.9–10.3)
CO2: 23 mmol/L (ref 22–32)
Chloride: 111 mmol/L (ref 101–111)
Creatinine, Ser: 2.14 mg/dL — ABNORMAL HIGH (ref 0.44–1.00)
GFR calc Af Amer: 23 mL/min — ABNORMAL LOW (ref >60)
GFR calc non Af Amer: 20 mL/min — ABNORMAL LOW (ref >60)
Glucose, Bld: 124 mg/dL — ABNORMAL HIGH (ref 65–99)
Potassium: 4.6 mmol/L (ref 3.5–5.1)
Sodium: 143 mmol/L (ref 135–145)
Total Bilirubin: 0.6 mg/dL (ref 0.3–1.2)
Total Protein: 6.9 g/dL (ref 6.5–8.1)

## 2016-11-26 LAB — URINALYSIS, ROUTINE W REFLEX MICROSCOPIC
Bilirubin Urine: NEGATIVE
Glucose, UA: NEGATIVE mg/dL
Ketones, ur: NEGATIVE mg/dL
Nitrite: NEGATIVE
Protein, ur: 100 mg/dL — AB
SPECIFIC GRAVITY, URINE: 1.011 (ref 1.005–1.030)
pH: 6 (ref 5.0–8.0)

## 2016-11-26 MED ORDER — SODIUM CHLORIDE 0.9 % IV BOLUS (SEPSIS)
1000.0000 mL | Freq: Once | INTRAVENOUS | Status: AC
  Administered 2016-11-26: 1000 mL via INTRAVENOUS

## 2016-11-26 MED ORDER — DEXTROSE 5 % IV SOLN
1.0000 g | Freq: Once | INTRAVENOUS | Status: AC
  Administered 2016-11-26: 1 g via INTRAVENOUS
  Filled 2016-11-26: qty 10

## 2016-11-26 MED ORDER — CIPROFLOXACIN HCL 500 MG PO TABS: 500.0000 mg | ORAL_TABLET | Freq: Two times a day (BID) | ORAL | 0 refills | Status: DC

## 2016-11-26 MED ORDER — METRONIDAZOLE 500 MG PO TABS: 500.0000 mg | ORAL_TABLET | Freq: Two times a day (BID) | ORAL | 0 refills | Status: DC

## 2016-11-26 NOTE — ED Triage Notes (Signed)
Pt reports she is having rectal pain from constipation. Pt reports she keeps having hard painful stools. Pt tried to disimpact herself last night with no success.

## 2016-11-26 NOTE — ED Notes (Signed)
Pt placed on 2L [place] for oxygen saturation of 85% RA

## 2016-11-26 NOTE — ED Notes (Signed)
In and Out performed on pt. The tech had Kristen RN to assist.  Pt. Had an output of . Nurse aware.

## 2016-11-26 NOTE — ED Notes (Signed)
She asks us to "find my daughter". I assure her that we will be able to make an announcement for her in our waiting room, and that she has not responded. She asks us to go check all of the inpatient floors for her, as she is wont to go up to a floor and watch TV there. Upon me informing her that we would be unable to search to hospital for her daughter, she told me I "am mean". She also tells us she is unable to void, so Dr. Particia NearingHaviland gives an order for I & O cath, which Anneah will perform shortly.

## 2016-11-26 NOTE — ED Provider Notes (Signed)
[facility]-EMERGENCY DEPT Provider Note   CSN: [id number] Arrival date & time: 11/26/16  1108     History   Chief Complaint Chief Complaint  Patient presents with  . Constipation    HPI [name] is a 83 y.o. female.  Pt presents to the ED today with rectal pain and constipation.  Pt said sx have been going on for 1 or 2 days.  She tried to disimpact herself last night without success.  She has taken miralax without success.      Past Medical History:  Diagnosis Date  . Anemia   . Carotid artery occlusion   . Carotid bruit   . Chronic kidney disease (CKD), stage III (moderate)   . Coronary artery disease    Remote PCI. Had BMS to RCA and DES stent to Diagonal in Jan. 2012   . Diastolic heart failure    EF 55 to 60% per cath in Jan 2012  . DVT (deep venous thrombosis) (HCC)   . Gout   . Hyperlipidemia   . Hypertension   . Ischemic cardiomyopathy    with prior EF of 35%  . NSTEMI (non-ST elevated myocardial infarction) [facility]) Jan 2012   with PCI to RCA and DX per Dr. Excell Seltzer  . Obesity   . Pneumonia 2016  . Proteinuria   . Stroke [facility]) 2013   ?  mini    . TIA (transient ischemic attack)   . Type II diabetes mellitus (HCC)    long standing    Patient Active Problem List   Diagnosis Date Noted  . Delusions (HCC) 09/28/2016  . Medication noncompliance due to cognitive impairment 09/28/2016  . Hemorrhoids 05/04/2016  . Pain in joint, lower leg 01/20/2016  . Ptosis 11/04/2015  . Glaucoma 08/25/2015  . Dementia without behavioral disturbance 06/10/2015  . Expressive aphasia 05/26/2015  . Anal prolapse 02/25/2015  . Chronic kidney disease, stage III (moderate) 02/10/2014  . PAD (peripheral artery disease) (HCC) 01/17/2014  . Constipation 07/31/2013  . Atherosclerosis of native artery of extremity with intermittent claudication (HCC) 07/04/2013  . Anxiety disorder 02/21/2013  . Diabetes mellitus with nephropathy (HCC) 07/05/2012  . Occlusion and stenosis of  carotid artery without mention of cerebral infarction 06/21/2012  . Coronary artery disease   . Chronic diastolic heart failure, NYHA class 1 (HCC)   . Hyperlipidemia   . Hypertension   . Carotid bruit   . Gout   . Obesity   . TIA (transient ischemic attack)     Past Surgical History:  Procedure Laterality Date  . ABDOMINAL AORTAGRAM N/A 01/17/2014   Procedure: ABDOMINAL Ronny Flurry;  Surgeon: Sherren Kerns, MD;  Location: [facility] CATH LAB;  Service: Cardiovascular;  Laterality: N/A;  . ANKLE FUSION Bilateral   . APPENDECTOMY    . CATARACT EXTRACTION, BILATERAL Bilateral   . CORONARY ANGIOPLASTY WITH STENT PLACEMENT  04/05/2010   distal RCA  . FRACTURE SURGERY Right 2005   bilateral ankles   . TONSILLECTOMY      OB History    No data available       Home Medications    Prior to Admission medications   Medication Sig Start Date End Date Taking? Authorizing Provider  aspirin EC 81 MG tablet Take 81 mg by mouth daily.    [provider]  ciprofloxacin (CIPRO) 500 MG tablet Take 1 tablet (500 mg total) by mouth 2 (two) times daily. 11/26/16   Jacalyn Lefevre, MD  cloNIDine (CATAPRES) 0.1 MG tablet Take  1 tablet (0.1 mg total) by mouth daily. For high blood pressure 09/28/16   Kirt Boys, DO  docusate sodium (COLACE) 100 MG capsule Take 1 capsule (100 mg total) by mouth 2 (two) times daily as needed for mild constipation. 08/24/16   Kirt Boys, DO  ezetimibe-simvastatin (VYTORIN) 10-20 MG tablet Take 1/2 tablet by mouth once nightly to control cholesterol 09/28/16   Kirt Boys, DO  furosemide (LASIX) 20 MG tablet Take 1 tablet (20 mg total) by mouth 2 (two) times daily. For Hypertension 09/28/16   Kirt Boys, DO  losartan (COZAAR) 50 MG tablet Take 1 tablet (50 mg total) by mouth daily. for high blood pressure 09/28/16   Montez Morita, [place], DO  Loteprednol Etabonate (LOTEMAX) 0.5 % GEL PLACE 1 DROP IN THE LEFT EYE TWICE DAILY 09/28/16   Kirt Boys, DO  meloxicam  (MOBIC) 15 MG tablet Take 1 tablet (15 mg total) by mouth as needed for pain. For foot and ankle pain 09/28/16   Kirt Boys, DO  metFORMIN (GLUCOPHAGE) 500 MG tablet Take One tablet by mouth twice daily to control glucose 09/28/16   Kirt Boys, DO  metroNIDAZOLE (FLAGYL) 500 MG tablet Take 1 tablet (500 mg total) by mouth 2 (two) times daily. 11/26/16   Jacalyn Lefevre, MD  Nebivolol HCl (BYSTOLIC) 20 MG TABS Take 1 tablet (20 mg total) by mouth daily. For Blood Pressure 09/28/16   Kirt Boys, DO  nitroGLYCERIN (NITROSTAT) 0.4 MG SL tablet Place one under tongue for chest pain, up to 3 tablets 09/28/16   Kirt Boys, DO  polyethylene glycol powder (GLYCOLAX/MIRALAX) powder DISSOLVE 17 GRAMS (1 CAPFUL) INTO 8 OUNCES OF LIQUID AND DRINK EVERY DAY AS NEEDED FOR BOWELS 09/28/16   Kirt Boys, DO  sitaGLIPtin (JANUVIA) 100 MG tablet Take 1 tablet (100 mg total) by mouth daily. For Diabetes 09/28/16   Kirt Boys, DO  timolol (BETIMOL) 0.5 % ophthalmic solution INSTILL 1 DROP INTO THE LEFT EYE DAILY 09/28/16   Kirt Boys, DO    Family History History reviewed. No pertinent family history.  Social History Social History  Substance Use Topics  . Smoking status: Former Games developer  . Smokeless tobacco: Never Used     Comment: "smoked in my teens; quit after 1 year or so"  . Alcohol use No     Allergies   Namzaric [memantine hcl-donepezil hcl]; Percocet [oxycodone-acetaminophen]; Sulfa antibiotics; Adhesive [tape]; Metformin and related; Tradjenta [linagliptin]; and Penicillins   Review of Systems Review of Systems  Gastrointestinal: Positive for constipation and rectal pain.  All other systems reviewed and are negative.    Physical Exam Updated Vital Signs BP (!) 213/66 (BP Location: Left Arm) Comment: pt reports hx of HTN and has not taken BP medication today.   Pulse 64   Temp 97.9 F (36.6 C) (Oral)   Resp 18   Ht 5' (1.524 m)   Wt 59 kg (130 lb)   SpO2 99%   BMI  25.39 kg/m   Physical Exam  Constitutional: She is oriented to person, place, and time. She appears well-developed and well-nourished.  HENT:  Head: Normocephalic and atraumatic.  Right Ear: External ear normal.  Left Ear: External ear normal.  Nose: Nose normal.  Mouth/Throat: Oropharynx is clear and moist.  Eyes: Pupils are equal, round, and reactive to light. Conjunctivae and EOM are normal.  Neck: Normal range of motion. Neck supple.  Cardiovascular: Normal rate, regular rhythm, normal heart sounds and intact distal pulses.   Pulmonary/Chest: Effort normal and  breath sounds normal.  Abdominal: Soft. Bowel sounds are normal.  Genitourinary: Rectal exam shows tenderness.  Genitourinary Comments: No fecal impaction  Musculoskeletal: Normal range of motion.  Neurological: She is alert and oriented to person, place, and time.  Skin: Skin is warm.  Psychiatric: She has a normal mood and affect. Her behavior is normal. Judgment and thought content normal.  Nursing note and vitals reviewed.    ED Treatments / Results  Labs (all labs ordered are listed, but only abnormal results are displayed) Labs Reviewed  COMPREHENSIVE METABOLIC PANEL - Abnormal; Notable for the following:       Result Value   Glucose, Bld 124 (*)    BUN 45 (*)    Creatinine, Ser 2.14 (*)    ALT 9 (*)    GFR calc non Af Amer 20 (*)    GFR calc Af Amer 23 (*)    All other components within normal limits  URINALYSIS, ROUTINE W REFLEX MICROSCOPIC - Abnormal; Notable for the following:    APPearance CLOUDY (*)    Hgb urine dipstick SMALL (*)    Protein, ur 100 (*)    Leukocytes, UA LARGE (*)    Bacteria, UA MANY (*)    Squamous Epithelial / LPF 0-5 (*)    All other components within normal limits  CBC WITH DIFFERENTIAL/PLATELET  LIPASE, BLOOD    EKG  EKG Interpretation None       Radiology Ct Abdomen Pelvis Wo Contrast  Result Date: 11/26/2016 CLINICAL DATA:  Rectal pain and constipation EXAM: CT  ABDOMEN AND PELVIS WITHOUT CONTRAST TECHNIQUE: Multidetector CT imaging of the abdomen and pelvis was performed following the standard protocol without IV contrast. COMPARISON:  11/26/2016, CT 08/17/2016 FINDINGS: Lower chest: No consolidation or effusion. Scattered ground-glass densities in the lower lobes. Mild lower lobe bronchial thickening and interstitial densities. Mild cardiomegaly. Coronary artery calcification. Hepatobiliary: No focal liver abnormality is seen. No gallstones, gallbladder wall thickening, or biliary dilatation. Pancreas: Unremarkable. No pancreatic ductal dilatation or surrounding inflammatory changes. Spleen: Normal in size without focal abnormality. Adrenals/Urinary Tract: No hydronephrosis. 3 mm stone at the left renal hilus could be vascular or a small stone in the pelvis. Normal bladder Stomach/Bowel: Stomach is nonenlarged. No dilated small bowel. Possible mild thickening of the rectum. Mild to moderate stool. Nonvisualized appendix Vascular/Lymphatic: Extensive vascular calcifications of the aorta and branch vessels. No aneurysmal dilatation. Bi-iliac stents. No significantly enlarged lymph nodes Reproductive: Calcified uterine fibroid. Stable 3 cm cyst left at adnexa Other: Negative for free air or free fluid. Small fat in the umbilicus. Musculoskeletal: Marked degenerative changes of the spine. No acute or suspicious lesion IMPRESSION: 1. Negative for a bowel obstruction. Stool burden is mild to moderate. Possible mild wall thickening of the rectum, could indicate mild proctitis. 2. Scattered ground-glass densities in the lower lobes which may reflect atelectasis. Increased lower lobe bronchial thickening and interstitial densities suggesting bronchitis or possible viral illness 3. 3 mm stone versus intravascular calcification at the left renal hilus. 4. Calcified uterine fibroid.  Stable 3 cm cyst at the left adnexa Electronically Signed   By: Jasmine PangKim  Fujinaga M.D.   On: 11/26/2016  19:30   Dg Abdomen Acute W/chest  Result Date: 11/26/2016 CLINICAL DATA:  83 year old female with history of rectal pain from severe constipation. EXAM: DG ABDOMEN ACUTE W/ 1V CHEST COMPARISON:  Chest x-ray 09/06/2016. Abdominal radiograph 08/31/2016. FINDINGS: Diffuse peribronchial cuffing and mild diffuse interstitial prominence appears to be chronic and is similar to  prior studies. Lung volumes are normal. No consolidative airspace disease. No pleural effusions. No pneumothorax. No pulmonary nodule or mass noted. Pulmonary vasculature and the cardiomediastinal silhouette are within normal limits. Gas and stool are seen scattered throughout the colon extending to the level of the distal rectum. No pathologic distension of small bowel is noted. No gross evidence of pneumoperitoneum. Stool burden does not appear excessive. Calcification in central pelvis corresponds to calcified fibroid on prior CT examination. IMPRESSION: 1. Nonobstructive bowel gas pattern. 2. No pneumoperitoneum. 3. Stool burden does not appear excessive despite the patient's reported history of constipation. 4. Diffuse peribronchial cuffing and mild diffuse interstitial prominence appears to be chronic and is similar to prior examinations. 5. Aortic atherosclerosis. Electronically Signed   By: Trudie Reed M.D.   On: 11/26/2016 16:44    Procedures Procedures (including critical care time)  Medications Ordered in ED Medications  sodium chloride 0.9 % bolus 1,000 mL (0 mLs Intravenous Stopped 11/26/16 1845)  cefTRIAXone (ROCEPHIN) 1 g in dextrose 5 % 50 mL IVPB (0 g Intravenous Stopped 11/26/16 1920)     Initial Impression / Assessment and Plan / ED Course  I have reviewed the triage vital signs and the nursing notes.  Pertinent labs & imaging results that were available during my care of the patient were reviewed by me and considered in my medical decision making (see chart for details).     Pt is feeling much better.  Pt  is given rocephin for her uti.  Culture will be sent.  She will be d/c home with cipro and flagyl to treat her proctitis.  She is given the number to GI to f/u.  Final Clinical Impressions(s) / ED Diagnoses   Final diagnoses:  Cystitis  Slow transit constipation  Proctitis    New Prescriptions New Prescriptions   CIPROFLOXACIN (CIPRO) 500 MG TABLET    Take 1 tablet (500 mg total) by mouth 2 (two) times daily.   METRONIDAZOLE (FLAGYL) 500 MG TABLET    Take 1 tablet (500 mg total) by mouth 2 (two) times daily.     Jacalyn Lefevre, MD 11/26/16 1944

## 2016-11-26 NOTE — ED Notes (Signed)
She is very upset that she cannot get a doctor to operate to fix her rectal prolapse. She is here today with issues of constipation and pain at rectal area. She denies any rectal bleeding. She is in no distress.

## 2016-11-28 ENCOUNTER — Ambulatory Visit: Payer: Self-pay | Admitting: *Deleted

## 2016-11-28 ENCOUNTER — Other Ambulatory Visit: Payer: Self-pay | Admitting: *Deleted

## 2016-11-29 ENCOUNTER — Ambulatory Visit (INDEPENDENT_AMBULATORY_CARE_PROVIDER_SITE_OTHER): Payer: Medicare Other | Admitting: Internal Medicine

## 2016-11-29 ENCOUNTER — Encounter: Payer: Self-pay | Admitting: Internal Medicine

## 2016-11-29 VITALS — BP 140/70 | HR 63 | Temp 97.7°F | Resp 12

## 2016-11-29 DIAGNOSIS — Z79899 Other long term (current) drug therapy: Secondary | ICD-10-CM

## 2016-11-29 DIAGNOSIS — K6289 Other specified diseases of anus and rectum: Secondary | ICD-10-CM

## 2016-11-29 DIAGNOSIS — E1121 Type 2 diabetes mellitus with diabetic nephropathy: Secondary | ICD-10-CM

## 2016-11-29 DIAGNOSIS — R197 Diarrhea, unspecified: Secondary | ICD-10-CM

## 2016-11-29 DIAGNOSIS — N3090 Cystitis, unspecified without hematuria: Secondary | ICD-10-CM | POA: Diagnosis not present

## 2016-11-29 DIAGNOSIS — F22 Delusional disorders: Secondary | ICD-10-CM | POA: Diagnosis not present

## 2016-11-29 DIAGNOSIS — F0390 Unspecified dementia without behavioral disturbance: Secondary | ICD-10-CM

## 2016-11-29 LAB — URINE CULTURE

## 2016-11-29 NOTE — Patient Outreach (Signed)
Request received from Reece LevyJanet Caldwell LCSW, to mail patient personal care resources.  Information mailed today.

## 2016-11-29 NOTE — Patient Instructions (Addendum)
STOP METFORMIN due to diarrhea  Will call with home health referral  Will call with lab results  Finish cipro and flagyl therapy  Push fluids and rest  Follow up in 1 month for DM and HTN, proctitis or sooner if no better

## 2016-11-29 NOTE — Progress Notes (Signed)
Patient ID: [name], female   DOB: 1928/01/26, 83 y.o.   MRN: [id number]    Location:  [facility] Place of Service: OFFICE  Chief Complaint  Patient presents with  . Follow-up    ER follow-up on rectal pain and constipation. Patient states I have no control over anything in my abdomen right now  . Immunizations    Refused flu vaccine   . Medication Refill    No refills needed today, patient is not certain of the medications she is taking   . Health Maintenance    Patient not sure of last eye exam, states I get them frequently   . Other    Weighed 113 today, not sure if accurate     HPI:  83 yo female seen today for ER f/u. She was seen in the ED 3 days ago for cystitis and proctitis. She was tx with cipro and flagyl. She is taking meds as ordered but states her bottom is very sore and she has frequent BMs. She is very confused and does not know why she is here. No f/c. She has lower quadrant pain. Urine cx (+) Kleb pneumonaie > 100K colonies. It is sens to cipro with MIC < 0.25. She takes metformin for diabetes and reports diarrhea. Cr 2.14  DM - A1c 7.1%. Takes metformin BID.  She is a poor historian due to dementia/delusions. Hx obtained from chart  Past Medical History:  Diagnosis Date  . Anemia   . Carotid artery occlusion   . Carotid bruit   . Chronic kidney disease (CKD), stage III (moderate)   . Coronary artery disease    Remote PCI. Had BMS to RCA and DES stent to Diagonal in Jan. 2012   . Diastolic heart failure    EF 55 to 60% per cath in Jan 2012  . DVT (deep venous thrombosis) ([place])   . Gout   . Hyperlipidemia   . Hypertension   . Ischemic cardiomyopathy    with prior EF of 35%  . NSTEMI (non-ST elevated myocardial infarction) [facility]) Jan 2012   with PCI to RCA and DX per Dr. Burt Knack  . Obesity   . Pneumonia 2016  . Proteinuria   . Stroke [facility]) 2013   ?  mini    . TIA (transient ischemic attack)   . Type II diabetes mellitus ([place])    long standing     Past Surgical History:  Procedure Laterality Date  . ABDOMINAL AORTAGRAM N/A 01/17/2014   Procedure: ABDOMINAL Maxcine Ham;  Surgeon: Elam Dutch, MD;  Location: [facility] CATH LAB;  Service: Cardiovascular;  Laterality: N/A;  . ANKLE FUSION Bilateral   . APPENDECTOMY    . CATARACT EXTRACTION, BILATERAL Bilateral   . CORONARY ANGIOPLASTY WITH STENT PLACEMENT  04/05/2010   distal RCA  . FRACTURE SURGERY Right 2005   bilateral ankles   . TONSILLECTOMY      Patient Care Team: Gildardo Cranker, DO as PCP - General (Internal Medicine) Burtis Junes, NP as Nurse Practitioner (Cardiology) Hayden Pedro, MD as Consulting Physician (Ophthalmology) Gayland Curry, DO as Consulting Physician (Geriatric Medicine) Elam Dutch, MD as Consulting Physician (Vascular Surgery) Ronald Lobo, MD as Consulting Physician (Gastroenterology) Sherren Mocha, MD as Consulting Physician (Cardiology) Michael Boston, MD as Consulting Physician (General Surgery) Deirdre Peer, LCSW as [facility]  Social History   Social History  . Marital status: Widowed    Spouse name: N/A  . Number of  children: N/A  . Years of education: N/A   Occupational History  . retired OR Marine scientist    Social History Main Topics  . Smoking status: Former Research scientist (life sciences)  . Smokeless tobacco: Never Used     Comment: "smoked in my teens; quit after 1 year or so"  . Alcohol use No  . Drug use: No  . Sexual activity: No   Other Topics Concern  . Not on file   Social History Narrative   Widow   Former smoker   Alcohol none   No Advance Directive        reports that she has quit smoking. She has never used smokeless tobacco. She reports that she does not drink alcohol or use drugs.  History reviewed. No pertinent family history. Family Status  Relation Status  . Mother Deceased       murdered  . Father Deceased       Died while working  . MGM Deceased  . MGF Deceased  . PGM  Deceased  . PGF Deceased  . Daughter Alive  . Daughter Alive  . Sister Deceased       Died at 89 years old     Allergies  Allergen Reactions  . Namzaric [Memantine Hcl-Donepezil Hcl] Nausea Only    confusion  . Percocet [Oxycodone-Acetaminophen] Other (See Comments)    loosy goosy  . Sulfa Antibiotics Other (See Comments)    Tongue swells  . Adhesive [Tape] Other (See Comments)    Tears skin.  Please use "paper" tape  . Metformin And Related Other (See Comments)    Abnormal Kidney Functions   . Tradjenta [Linagliptin]     Low back ache, resolved once medication stopped   . Penicillins Hives    Tolerates Ceftriaxone, Has patient had a PCN reaction causing immediate rash, facial/tongue/throat swelling, SOB or lightheadedness with hypotension: Unknown Has patient had a PCN reaction causing severe rash involving mucus membranes or skin necrosis: No Has patient had a PCN reaction that required hospitalization No Has patient had a PCN reaction occurring within the last 10 years: No If all of the above answers are "NO", then may proceed with Cephalosporin use.     Medications: Patient's Medications  New Prescriptions   No medications on file  Previous Medications   ASPIRIN EC 81 MG TABLET    Take 81 mg by mouth daily.   CIPROFLOXACIN (CIPRO) 500 MG TABLET    Take 1 tablet (500 mg total) by mouth 2 (two) times daily.   CLONIDINE (CATAPRES) 0.1 MG TABLET    Take 1 tablet (0.1 mg total) by mouth daily. For high blood pressure   DOCUSATE SODIUM (COLACE) 100 MG CAPSULE    Take 1 capsule (100 mg total) by mouth 2 (two) times daily as needed for mild constipation.   EZETIMIBE-SIMVASTATIN (VYTORIN) 10-20 MG TABLET    Take 1/2 tablet by mouth once nightly to control cholesterol   FUROSEMIDE (LASIX) 20 MG TABLET    Take 1 tablet (20 mg total) by mouth 2 (two) times daily. For Hypertension   LOSARTAN (COZAAR) 50 MG TABLET    Take 1 tablet (50 mg total) by mouth daily. for high blood pressure    LOTEPREDNOL ETABONATE (LOTEMAX) 0.5 % GEL    PLACE 1 DROP IN THE LEFT EYE TWICE DAILY   MELOXICAM (MOBIC) 15 MG TABLET    Take 1 tablet (15 mg total) by mouth as needed for pain. For foot and ankle pain   METFORMIN (  GLUCOPHAGE) 500 MG TABLET    Take One tablet by mouth twice daily to control glucose   METRONIDAZOLE (FLAGYL) 500 MG TABLET    Take 1 tablet (500 mg total) by mouth 2 (two) times daily.   NEBIVOLOL HCL (BYSTOLIC) 20 MG TABS    Take 1 tablet (20 mg total) by mouth daily. For Blood Pressure   NITROGLYCERIN (NITROSTAT) 0.4 MG SL TABLET    Place one under tongue for chest pain, up to 3 tablets   POLYETHYLENE GLYCOL POWDER (GLYCOLAX/MIRALAX) POWDER    DISSOLVE 17 GRAMS (1 CAPFUL) INTO 8 OUNCES OF LIQUID AND DRINK EVERY DAY AS NEEDED FOR BOWELS   SITAGLIPTIN (JANUVIA) 100 MG TABLET    Take 1 tablet (100 mg total) by mouth daily. For Diabetes   TIMOLOL (BETIMOL) 0.5 % OPHTHALMIC SOLUTION    INSTILL 1 DROP INTO THE LEFT EYE DAILY  Modified Medications   No medications on file  Discontinued Medications   No medications on file    Review of Systems  Unable to perform ROS: Dementia (delusions/memory loss)    Vitals:   11/29/16 1552  BP: 140/70  Pulse: 63  Resp: 12  Temp: 97.7 F (36.5 C)  TempSrc: Oral  SpO2: 90%   There is no height or weight on file to calculate BMI.  Physical Exam  Constitutional: She appears well-developed.  Frail appearing in NAD  HENT:  Mouth/Throat: Oropharynx is clear and moist. No oropharyngeal exudate.  MMM; no oral thrush  Eyes: Pupils are equal, round, and reactive to light. No scleral icterus.  Neck: Neck supple. Carotid bruit is present (b/l systolic). No tracheal deviation present. No thyromegaly present.  Cardiovascular: Normal rate, regular rhythm and intact distal pulses.  Exam reveals no gallop and no friction rub.   Murmur (1/6 SEM) heard. No LE edema b/l. no calf TTP.   Pulmonary/Chest: Effort normal and breath sounds normal. No  stridor. No respiratory distress. She has no wheezes. She has no rales.  Abdominal: Soft. Normal appearance and bowel sounds are normal. She exhibits distension. She exhibits no mass. There is no hepatomegaly. There is tenderness. There is no rigidity, no rebound and no guarding. No hernia.  Musculoskeletal: She exhibits edema.  Lymphadenopathy:    She has no cervical adenopathy.  Neurological: She is alert.  Skin: Skin is warm and dry. No rash noted.  Psychiatric: She has a normal mood and affect. Her behavior is normal.     Labs reviewed: Admission on 11/26/2016, Discharged on 11/26/2016  Component Date Value Ref Range Status  . Sodium 11/26/2016 143  135 - 145 mmol/L Final  . Potassium 11/26/2016 4.6  3.5 - 5.1 mmol/L Final  . Chloride 11/26/2016 111  101 - 111 mmol/L Final  . CO2 11/26/2016 23  22 - 32 mmol/L Final  . Glucose, Bld 11/26/2016 124* 65 - 99 mg/dL Final  . BUN 11/26/2016 45* 6 - 20 mg/dL Final  . Creatinine, Ser 11/26/2016 2.14* 0.44 - 1.00 mg/dL Final  . Calcium 11/26/2016 9.0  8.9 - 10.3 mg/dL Final  . Total Protein 11/26/2016 6.9  6.5 - 8.1 g/dL Final  . Albumin 11/26/2016 3.6  3.5 - 5.0 g/dL Final  . AST 11/26/2016 16  15 - 41 U/L Final  . ALT 11/26/2016 9* 14 - 54 U/L Final  . Alkaline Phosphatase 11/26/2016 70  38 - 126 U/L Final  . Total Bilirubin 11/26/2016 0.6  0.3 - 1.2 mg/dL Final  . GFR calc non Af Wyvonnia Lora  11/26/2016 20* >60 mL/min Final  . GFR calc Af Amer 11/26/2016 23* >60 mL/min Final   Comment: (NOTE) The eGFR has been calculated using the CKD EPI equation. This calculation has not been validated in all clinical situations. eGFR's persistently <60 mL/min signify possible Chronic Kidney Disease.   . Anion gap 11/26/2016 9  5 - 15 Final  . WBC 11/26/2016 5.0  4.0 - 10.5 K/uL Final  . RBC 11/26/2016 4.00  3.87 - 5.11 MIL/uL Final  . Hemoglobin 11/26/2016 12.0  12.0 - 15.0 g/dL Final  . HCT 11/26/2016 37.0  36.0 - 46.0 % Final  . MCV 11/26/2016 92.5   78.0 - 100.0 fL Final  . MCH 11/26/2016 30.0  26.0 - 34.0 pg Final  . MCHC 11/26/2016 32.4  30.0 - 36.0 g/dL Final  . RDW 11/26/2016 14.1  11.5 - 15.5 % Final  . Platelets 11/26/2016 226  150 - 400 K/uL Final  . Neutrophils Relative % 11/26/2016 60  % Final  . Neutro Abs 11/26/2016 3.0  1.7 - 7.7 K/uL Final  . Lymphocytes Relative 11/26/2016 28  % Final  . Lymphs Abs 11/26/2016 1.4  0.7 - 4.0 K/uL Final  . Monocytes Relative 11/26/2016 10  % Final  . Monocytes Absolute 11/26/2016 0.5  0.1 - 1.0 K/uL Final  . Eosinophils Relative 11/26/2016 2  % Final  . Eosinophils Absolute 11/26/2016 0.1  0.0 - 0.7 K/uL Final  . Basophils Relative 11/26/2016 0  % Final  . Basophils Absolute 11/26/2016 0.0  0.0 - 0.1 K/uL Final  . Color, Urine 11/26/2016 YELLOW  YELLOW Final  . APPearance 11/26/2016 CLOUDY* CLEAR Final  . Specific Gravity, Urine 11/26/2016 1.011  1.005 - 1.030 Final  . pH 11/26/2016 6.0  5.0 - 8.0 Final  . Glucose, UA 11/26/2016 NEGATIVE  NEGATIVE mg/dL Final  . Hgb urine dipstick 11/26/2016 SMALL* NEGATIVE Final  . Bilirubin Urine 11/26/2016 NEGATIVE  NEGATIVE Final  . Ketones, ur 11/26/2016 NEGATIVE  NEGATIVE mg/dL Final  . Protein, ur 11/26/2016 100* NEGATIVE mg/dL Final  . Nitrite 11/26/2016 NEGATIVE  NEGATIVE Final  . Leukocytes, UA 11/26/2016 LARGE* NEGATIVE Final  . RBC / HPF 11/26/2016 0-5  0 - 5 RBC/hpf Final  . WBC, UA 11/26/2016 TOO NUMEROUS TO COUNT  0 - 5 WBC/hpf Final  . Bacteria, UA 11/26/2016 MANY* NONE SEEN Final  . Squamous Epithelial / LPF 11/26/2016 0-5* NONE SEEN Final  . WBC Clumps 11/26/2016 PRESENT   Final  . Lipase 11/26/2016 18  11 - 51 U/L Final  . Specimen Description 11/26/2016 URINE, RANDOM   Final  . Special Requests 11/26/2016 NONE   Final  . Culture 11/26/2016 >=100,000 COLONIES/mL KLEBSIELLA PNEUMONIAE*  Final  . Report Status 11/26/2016 11/29/2016 FINAL   Final  . Organism ID, Bacteria 11/26/2016 KLEBSIELLA PNEUMONIAE*  Final  Admission on  10/05/2016, Discharged on 10/05/2016  Component Date Value Ref Range Status  . Color, Urine 10/05/2016 STRAW* YELLOW Final  . APPearance 10/05/2016 CLEAR  CLEAR Final  . Specific Gravity, Urine 10/05/2016 1.004* 1.005 - 1.030 Final  . pH 10/05/2016 7.0  5.0 - 8.0 Final  . Glucose, UA 10/05/2016 NEGATIVE  NEGATIVE mg/dL Final  . Hgb urine dipstick 10/05/2016 MODERATE* NEGATIVE Final  . Bilirubin Urine 10/05/2016 NEGATIVE  NEGATIVE Final  . Ketones, ur 10/05/2016 NEGATIVE  NEGATIVE mg/dL Final  . Protein, ur 10/05/2016 100* NEGATIVE mg/dL Final  . Nitrite 10/05/2016 NEGATIVE  NEGATIVE Final  . Leukocytes, UA 10/05/2016 TRACE* NEGATIVE Final  .  RBC / HPF 10/05/2016 0-5  0 - 5 RBC/hpf Final  . WBC, UA 10/05/2016 0-5  0 - 5 WBC/hpf Final  . Bacteria, UA 10/05/2016 RARE* NONE SEEN Final  . Squamous Epithelial / LPF 10/05/2016 NONE SEEN  NONE SEEN Final  Admission on 10/01/2016, Discharged on 10/02/2016  Component Date Value Ref Range Status  . Lipase 10/01/2016 21  11 - 51 U/L Final  . Sodium 10/01/2016 139  135 - 145 mmol/L Final  . Potassium 10/01/2016 4.3  3.5 - 5.1 mmol/L Final  . Chloride 10/01/2016 109  101 - 111 mmol/L Final  . CO2 10/01/2016 22  22 - 32 mmol/L Final  . Glucose, Bld 10/01/2016 145* 65 - 99 mg/dL Final  . BUN 10/01/2016 44* 6 - 20 mg/dL Final  . Creatinine, Ser 10/01/2016 2.16* 0.44 - 1.00 mg/dL Final  . Calcium 10/01/2016 8.7* 8.9 - 10.3 mg/dL Final  . Total Protein 10/01/2016 5.7* 6.5 - 8.1 g/dL Final  . Albumin 10/01/2016 3.1* 3.5 - 5.0 g/dL Final  . AST 10/01/2016 14* 15 - 41 U/L Final  . ALT 10/01/2016 9* 14 - 54 U/L Final  . Alkaline Phosphatase 10/01/2016 49  38 - 126 U/L Final  . Total Bilirubin 10/01/2016 0.5  0.3 - 1.2 mg/dL Final  . GFR calc non Af Amer 10/01/2016 19* >60 mL/min Final  . GFR calc Af Amer 10/01/2016 22* >60 mL/min Final   Comment: (NOTE) The eGFR has been calculated using the CKD EPI equation. This calculation has not been validated in  all clinical situations. eGFR's persistently <60 mL/min signify possible Chronic Kidney Disease.   . Anion gap 10/01/2016 8  5 - 15 Final  . WBC 10/01/2016 3.8* 4.0 - 10.5 K/uL Final  . RBC 10/01/2016 3.33* 3.87 - 5.11 MIL/uL Final  . Hemoglobin 10/01/2016 10.1* 12.0 - 15.0 g/dL Final  . HCT 10/01/2016 30.8* 36.0 - 46.0 % Final  . MCV 10/01/2016 92.5  78.0 - 100.0 fL Final  . MCH 10/01/2016 30.3  26.0 - 34.0 pg Final  . MCHC 10/01/2016 32.8  30.0 - 36.0 g/dL Final  . RDW 10/01/2016 14.2  11.5 - 15.5 % Final  . Platelets 10/01/2016 198  150 - 400 K/uL Final  . Color, Urine 10/01/2016 STRAW* YELLOW Final  . APPearance 10/01/2016 CLEAR  CLEAR Final  . Specific Gravity, Urine 10/01/2016 1.004* 1.005 - 1.030 Final  . pH 10/01/2016 6.0  5.0 - 8.0 Final  . Glucose, UA 10/01/2016 NEGATIVE  NEGATIVE mg/dL Final  . Hgb urine dipstick 10/01/2016 SMALL* NEGATIVE Final  . Bilirubin Urine 10/01/2016 NEGATIVE  NEGATIVE Final  . Ketones, ur 10/01/2016 NEGATIVE  NEGATIVE mg/dL Final  . Protein, ur 10/01/2016 30* NEGATIVE mg/dL Final  . Nitrite 10/01/2016 NEGATIVE  NEGATIVE Final  . Leukocytes, UA 10/01/2016 NEGATIVE  NEGATIVE Final  . RBC / HPF 10/01/2016 NONE SEEN  0 - 5 RBC/hpf Final  . WBC, UA 10/01/2016 0-5  0 - 5 WBC/hpf Final  . Bacteria, UA 10/01/2016 RARE* NONE SEEN Final  . Squamous Epithelial / LPF 10/01/2016 NONE SEEN  NONE SEEN Final  Office Visit on 09/08/2016  Component Date Value Ref Range Status  . TSH 09/08/2016 0.78  mIU/L Final   Comment:   Reference Range   > or = 20 Years  0.40-4.50   Pregnancy Range First trimester  0.26-2.66 Second trimester 0.55-2.73 Third trimester  0.43-2.91     . RPR Ser Ql 09/08/2016 NON REAC  NON REAC Final  Admission on 08/31/2016, Discharged on 08/31/2016  Component Date Value Ref Range Status  . Sodium 08/31/2016 145  135 - 145 mmol/L Final  . Potassium 08/31/2016 4.0  3.5 - 5.1 mmol/L Final  . Chloride 08/31/2016 108  101 - 111 mmol/L  Final  . CO2 08/31/2016 25  22 - 32 mmol/L Final  . Glucose, Bld 08/31/2016 114* 65 - 99 mg/dL Final  . BUN 08/31/2016 44* 6 - 20 mg/dL Final  . Creatinine, Ser 08/31/2016 2.01* 0.44 - 1.00 mg/dL Final  . Calcium 08/31/2016 9.2  8.9 - 10.3 mg/dL Final  . GFR calc non Af Amer 08/31/2016 21* >60 mL/min Final  . GFR calc Af Amer 08/31/2016 25* >60 mL/min Final   Comment: (NOTE) The eGFR has been calculated using the CKD EPI equation. This calculation has not been validated in all clinical situations. eGFR's persistently <60 mL/min signify possible Chronic Kidney Disease.   . Anion gap 08/31/2016 12  5 - 15 Final  . WBC 08/31/2016 6.2  4.0 - 10.5 K/uL Final  . RBC 08/31/2016 4.16  3.87 - 5.11 MIL/uL Final  . Hemoglobin 08/31/2016 12.6  12.0 - 15.0 g/dL Final  . HCT 08/31/2016 38.0  36.0 - 46.0 % Final  . MCV 08/31/2016 91.3  78.0 - 100.0 fL Final  . MCH 08/31/2016 30.3  26.0 - 34.0 pg Final  . MCHC 08/31/2016 33.2  30.0 - 36.0 g/dL Final  . RDW 08/31/2016 14.1  11.5 - 15.5 % Final  . Platelets 08/31/2016 226  150 - 400 K/uL Final  . Color, Urine 08/31/2016 STRAW* YELLOW Final  . APPearance 08/31/2016 CLEAR  CLEAR Final  . Specific Gravity, Urine 08/31/2016 1.005  1.005 - 1.030 Final  . pH 08/31/2016 5.0  5.0 - 8.0 Final  . Glucose, UA 08/31/2016 NEGATIVE  NEGATIVE mg/dL Final  . Hgb urine dipstick 08/31/2016 NEGATIVE  NEGATIVE Final  . Bilirubin Urine 08/31/2016 NEGATIVE  NEGATIVE Final  . Ketones, ur 08/31/2016 NEGATIVE  NEGATIVE mg/dL Final  . Protein, ur 08/31/2016 30* NEGATIVE mg/dL Final  . Nitrite 08/31/2016 NEGATIVE  NEGATIVE Final  . Leukocytes, UA 08/31/2016 NEGATIVE  NEGATIVE Final  . RBC / HPF 08/31/2016 0-5  0 - 5 RBC/hpf Final  . WBC, UA 08/31/2016 0-5  0 - 5 WBC/hpf Final  . Bacteria, UA 08/31/2016 NONE SEEN  NONE SEEN Final  . Squamous Epithelial / LPF 08/31/2016 0-5* NONE SEEN Final  . Mucus 08/31/2016 PRESENT   Final  . Glucose-Capillary 08/31/2016 114* 65 - 99  mg/dL Final    Ct Abdomen Pelvis Wo Contrast  Result Date: 11/26/2016 CLINICAL DATA:  Rectal pain and constipation EXAM: CT ABDOMEN AND PELVIS WITHOUT CONTRAST TECHNIQUE: Multidetector CT imaging of the abdomen and pelvis was performed following the standard protocol without IV contrast. COMPARISON:  11/26/2016, CT 08/17/2016 FINDINGS: Lower chest: No consolidation or effusion. Scattered ground-glass densities in the lower lobes. Mild lower lobe bronchial thickening and interstitial densities. Mild cardiomegaly. Coronary artery calcification. Hepatobiliary: No focal liver abnormality is seen. No gallstones, gallbladder wall thickening, or biliary dilatation. Pancreas: Unremarkable. No pancreatic ductal dilatation or surrounding inflammatory changes. Spleen: Normal in size without focal abnormality. Adrenals/Urinary Tract: No hydronephrosis. 3 mm stone at the left renal hilus could be vascular or a small stone in the pelvis. Normal bladder Stomach/Bowel: Stomach is nonenlarged. No dilated small bowel. Possible mild thickening of the rectum. Mild to moderate stool. Nonvisualized appendix Vascular/Lymphatic: Extensive vascular calcifications of the aorta and branch vessels. No  aneurysmal dilatation. Bi-iliac stents. No significantly enlarged lymph nodes Reproductive: Calcified uterine fibroid. Stable 3 cm cyst left at adnexa Other: Negative for free air or free fluid. Small fat in the umbilicus. Musculoskeletal: Marked degenerative changes of the spine. No acute or suspicious lesion IMPRESSION: 1. Negative for a bowel obstruction. Stool burden is mild to moderate. Possible mild wall thickening of the rectum, could indicate mild proctitis. 2. Scattered ground-glass densities in the lower lobes which may reflect atelectasis. Increased lower lobe bronchial thickening and interstitial densities suggesting bronchitis or possible viral illness 3. 3 mm stone versus intravascular calcification at the left renal hilus. 4.  Calcified uterine fibroid.  Stable 3 cm cyst at the left adnexa Electronically Signed   By: Donavan Foil M.D.   On: 11/26/2016 19:30   Dg Abdomen Acute W/chest  Result Date: 11/26/2016 CLINICAL DATA:  83 year old female with history of rectal pain from severe constipation. EXAM: DG ABDOMEN ACUTE W/ 1V CHEST COMPARISON:  Chest x-ray 09/06/2016. Abdominal radiograph 08/31/2016. FINDINGS: Diffuse peribronchial cuffing and mild diffuse interstitial prominence appears to be chronic and is similar to prior studies. Lung volumes are normal. No consolidative airspace disease. No pleural effusions. No pneumothorax. No pulmonary nodule or mass noted. Pulmonary vasculature and the cardiomediastinal silhouette are within normal limits. Gas and stool are seen scattered throughout the colon extending to the level of the distal rectum. No pathologic distension of small bowel is noted. No gross evidence of pneumoperitoneum. Stool burden does not appear excessive. Calcification in central pelvis corresponds to calcified fibroid on prior CT examination. IMPRESSION: 1. Nonobstructive bowel gas pattern. 2. No pneumoperitoneum. 3. Stool burden does not appear excessive despite the patient's reported history of constipation. 4. Diffuse peribronchial cuffing and mild diffuse interstitial prominence appears to be chronic and is similar to prior examinations. 5. Aortic atherosclerosis. Electronically Signed   By: Vinnie Langton M.D.   On: 11/26/2016 16:44     Assessment/Plan   ICD-10-CM   1. Proctitis K62.89 Ambulatory referral to Gastroenterology    BMP with eGFR  2. Cystitis N30.90   3. Diabetes mellitus with nephropathy ([place]) E11.21 Ambulatory referral to Home Health  4. Dementia without behavioral disturbance, unspecified dementia type F03.90 Ambulatory referral to [place]  5. Delusions ([place]) [place] Ambulatory referral to Home Health  6. Diarrhea, unspecified type R19.7 BMP with eGFR  7. High risk medication use  Z79.899 Ambulatory referral to Home Health    No flu shot given today. Will need at next OV if stable  STOP METFORMIN due to diarrhea  Will call with home health referral  Will call with lab results  Finish cipro and flagyl therapy  Push fluids and rest  Follow up in 1 month for DM and HTN, proctitis or sooner if no better   [facility] S. Perlie Gold  [facility] [address] [place], [place] [address] [phone] Cell (Monday-Friday 8 AM - 5 PM) [phone] After 5 PM and follow prompts

## 2016-11-30 ENCOUNTER — Other Ambulatory Visit: Payer: Medicare Other

## 2016-11-30 ENCOUNTER — Telehealth: Payer: Self-pay | Admitting: *Deleted

## 2016-11-30 ENCOUNTER — Ambulatory Visit: Payer: Medicare Other | Admitting: Internal Medicine

## 2016-11-30 ENCOUNTER — Telehealth: Payer: Self-pay | Admitting: Emergency Medicine

## 2016-11-30 LAB — BASIC METABOLIC PANEL WITH GFR
BUN/Creatinine Ratio: 12 (calc) (ref 6–22)
BUN: 27 mg/dL — AB (ref 7–25)
CALCIUM: 8.7 mg/dL (ref 8.6–10.4)
CHLORIDE: 109 mmol/L (ref 98–110)
CO2: 24 mmol/L (ref 20–32)
Creat: 2.3 mg/dL — ABNORMAL HIGH (ref 0.60–0.88)
GFR, Est African American: 21 mL/min/{1.73_m2} — ABNORMAL LOW (ref >=60)
GFR, Est Non African American: 18 mL/min/{1.73_m2} — ABNORMAL LOW (ref >=60)
GLUCOSE: 190 mg/dL — AB (ref 65–139)
POTASSIUM: 5.7 mmol/L — AB (ref 3.5–5.3)
Sodium: 141 mmol/L (ref 135–146)

## 2016-11-30 NOTE — Telephone Encounter (Signed)
Patient is requesting a cream for her bottom and a pain medication for rectal pain. Stated that she saw you yesterday and its no better. Please Advise.   [facility] [address]

## 2016-11-30 NOTE — Telephone Encounter (Signed)
Post ED Visit - Positive Culture Follow-up  Culture report reviewed by antimicrobial stewardship pharmacist:  []  Enzo BiNathan Batchelder, Pharm.D. []  Celedonio MiyamotoJeremy Frens, [address]Ph[address] BCPS AQ-ID []  Garvin FilaMike Maccia, Pharm.D., BCPS []  Georgina PillionElizabeth Martin, Pharm.D., BCPS []  ParrottMinh Pham, [address]Ph[address] BCPS, AAHIVP [x]  Estella HuskMichelle Turner, Pharm.D., BCPS, AAHIVP []  Lysle Pearlachel Rumbarger, PharmD, BCPS []  Casilda Carlsaylor Stone, PharmD, BCPS []  [name] SamplesAndy Johnston, PharmD, BCPS  Positive urine culture Treated with ciprofloxacin and metronidazole, organism sensitive to the same and no further patient follow-up is required at this time.  Berle MullMiller, Mosetta Ferdinand 11/30/2016, 1:16 PM

## 2016-11-30 NOTE — Telephone Encounter (Signed)
Patient caregiver notified

## 2016-11-30 NOTE — Telephone Encounter (Signed)
No - she needs to f/u with GI for proctitis. No cream will help that issue. please get GI appt ASAP

## 2016-12-01 ENCOUNTER — Other Ambulatory Visit: Payer: Self-pay

## 2016-12-01 ENCOUNTER — Ambulatory Visit: Payer: Self-pay | Admitting: *Deleted

## 2016-12-01 DIAGNOSIS — K6289 Other specified diseases of anus and rectum: Secondary | ICD-10-CM

## 2016-12-06 ENCOUNTER — Other Ambulatory Visit: Payer: Medicare Other

## 2016-12-06 ENCOUNTER — Other Ambulatory Visit: Payer: Self-pay | Admitting: *Deleted

## 2016-12-06 ENCOUNTER — Telehealth: Payer: Self-pay | Admitting: *Deleted

## 2016-12-06 DIAGNOSIS — K6289 Other specified diseases of anus and rectum: Secondary | ICD-10-CM

## 2016-12-06 LAB — BASIC METABOLIC PANEL WITH GFR
BUN / CREAT RATIO: 11 (calc) (ref 6–22)
BUN: 33 mg/dL — AB (ref 7–25)
CHLORIDE: 108 mmol/L (ref 98–110)
CO2: 22 mmol/L (ref 20–32)
Calcium: 8.5 mg/dL — ABNORMAL LOW (ref 8.6–10.4)
Creat: 2.88 mg/dL — ABNORMAL HIGH (ref 0.60–0.88)
GFR, EST AFRICAN AMERICAN: 16 mL/min/{1.73_m2} — AB (ref >=60)
GFR, EST NON AFRICAN AMERICAN: 14 mL/min/{1.73_m2} — AB (ref >=60)
Glucose, Bld: 123 mg/dL (ref 65–139)
Potassium: 5.4 mmol/L — ABNORMAL HIGH (ref 3.5–5.3)
Sodium: 139 mmol/L (ref 135–146)

## 2016-12-06 NOTE — Telephone Encounter (Signed)
Called patient to check on her due to she was a no show for her lab appointment. Left a message for her to return the call to the office.

## 2016-12-07 ENCOUNTER — Other Ambulatory Visit: Payer: Self-pay | Admitting: *Deleted

## 2016-12-07 DIAGNOSIS — E1121 Type 2 diabetes mellitus with diabetic nephropathy: Secondary | ICD-10-CM

## 2016-12-07 MED ORDER — SODIUM POLYSTYRENE SULFONATE 15 GM/60ML PO SUSP: ORAL | 0 refills | Status: DC

## 2016-12-08 ENCOUNTER — Observation Stay (HOSPITAL_COMMUNITY)
Admission: EM | Admit: 2016-12-08 | Discharge: 2016-12-11 | Disposition: A | Discharge status: Home / Self Care | Payer: Medicare Other | Attending: Internal Medicine | Admitting: Internal Medicine

## 2016-12-08 DIAGNOSIS — E876 Hypokalemia: Secondary | ICD-10-CM | POA: Diagnosis not present

## 2016-12-08 DIAGNOSIS — N183 Chronic kidney disease, stage 3 unspecified: Secondary | ICD-10-CM

## 2016-12-08 DIAGNOSIS — E1121 Type 2 diabetes mellitus with diabetic nephropathy: Secondary | ICD-10-CM | POA: Diagnosis present

## 2016-12-08 DIAGNOSIS — Z7982 Long term (current) use of aspirin: Secondary | ICD-10-CM | POA: Insufficient documentation

## 2016-12-08 DIAGNOSIS — Z885 Allergy status to narcotic agent status: Secondary | ICD-10-CM | POA: Insufficient documentation

## 2016-12-08 DIAGNOSIS — I130 Hypertensive heart and chronic kidney disease with heart failure and stage 1 through stage 4 chronic kidney disease, or unspecified chronic kidney disease: Secondary | ICD-10-CM | POA: Diagnosis not present

## 2016-12-08 DIAGNOSIS — I255 Ischemic cardiomyopathy: Secondary | ICD-10-CM | POA: Diagnosis not present

## 2016-12-08 DIAGNOSIS — M109 Gout, unspecified: Secondary | ICD-10-CM | POA: Diagnosis not present

## 2016-12-08 DIAGNOSIS — Z880 Allergy status to penicillin: Secondary | ICD-10-CM | POA: Diagnosis not present

## 2016-12-08 DIAGNOSIS — Z79899 Other long term (current) drug therapy: Secondary | ICD-10-CM | POA: Diagnosis not present

## 2016-12-08 DIAGNOSIS — I739 Peripheral vascular disease, unspecified: Secondary | ICD-10-CM | POA: Diagnosis present

## 2016-12-08 DIAGNOSIS — Z66 Do not resuscitate: Secondary | ICD-10-CM | POA: Diagnosis not present

## 2016-12-08 DIAGNOSIS — Z7984 Long term (current) use of oral hypoglycemic drugs: Secondary | ICD-10-CM | POA: Insufficient documentation

## 2016-12-08 DIAGNOSIS — R197 Diarrhea, unspecified: Principal | ICD-10-CM | POA: Diagnosis present

## 2016-12-08 DIAGNOSIS — I5032 Chronic diastolic (congestive) heart failure: Secondary | ICD-10-CM | POA: Diagnosis not present

## 2016-12-08 DIAGNOSIS — I2510 Atherosclerotic heart disease of native coronary artery without angina pectoris: Secondary | ICD-10-CM | POA: Diagnosis not present

## 2016-12-08 DIAGNOSIS — Z882 Allergy status to sulfonamides status: Secondary | ICD-10-CM | POA: Insufficient documentation

## 2016-12-08 DIAGNOSIS — N179 Acute kidney failure, unspecified: Secondary | ICD-10-CM | POA: Insufficient documentation

## 2016-12-08 DIAGNOSIS — Z888 Allergy status to other drugs, medicaments and biological substances status: Secondary | ICD-10-CM | POA: Diagnosis not present

## 2016-12-08 DIAGNOSIS — E1151 Type 2 diabetes mellitus with diabetic peripheral angiopathy without gangrene: Secondary | ICD-10-CM | POA: Diagnosis not present

## 2016-12-08 DIAGNOSIS — I252 Old myocardial infarction: Secondary | ICD-10-CM | POA: Diagnosis not present

## 2016-12-08 DIAGNOSIS — F0390 Unspecified dementia without behavioral disturbance: Secondary | ICD-10-CM | POA: Diagnosis present

## 2016-12-08 DIAGNOSIS — Z87891 Personal history of nicotine dependence: Secondary | ICD-10-CM | POA: Insufficient documentation

## 2016-12-08 DIAGNOSIS — Z9114 Patient's other noncompliance with medication regimen: Secondary | ICD-10-CM

## 2016-12-08 DIAGNOSIS — R627 Adult failure to thrive: Secondary | ICD-10-CM | POA: Diagnosis not present

## 2016-12-08 DIAGNOSIS — T50905A Adverse effect of unspecified drugs, medicaments and biological substances, initial encounter: Secondary | ICD-10-CM

## 2016-12-08 DIAGNOSIS — Z87440 Personal history of urinary (tract) infections: Secondary | ICD-10-CM | POA: Insufficient documentation

## 2016-12-08 DIAGNOSIS — Z955 Presence of coronary angioplasty implant and graft: Secondary | ICD-10-CM | POA: Insufficient documentation

## 2016-12-08 DIAGNOSIS — E1122 Type 2 diabetes mellitus with diabetic chronic kidney disease: Secondary | ICD-10-CM | POA: Insufficient documentation

## 2016-12-08 DIAGNOSIS — E785 Hyperlipidemia, unspecified: Secondary | ICD-10-CM | POA: Insufficient documentation

## 2016-12-08 DIAGNOSIS — F0150 Vascular dementia without behavioral disturbance: Secondary | ICD-10-CM

## 2016-12-08 DIAGNOSIS — Z8673 Personal history of transient ischemic attack (TIA), and cerebral infarction without residual deficits: Secondary | ICD-10-CM | POA: Insufficient documentation

## 2016-12-08 DIAGNOSIS — I10 Essential (primary) hypertension: Secondary | ICD-10-CM | POA: Diagnosis present

## 2016-12-08 DIAGNOSIS — Z86718 Personal history of other venous thrombosis and embolism: Secondary | ICD-10-CM | POA: Insufficient documentation

## 2016-12-08 NOTE — ED Notes (Signed)
Bed: [facility] Expected date:  Expected time:  Means of arrival:  Comments: EMS 83 yo female from home-diarrhea for 2 weeks-saw primary Monday-given meds with no relief 160/90

## 2016-12-08 NOTE — ED Notes (Signed)
Pt has noticeable redness on rectum and vaginal area. Pt continues to have multiple episodes of diarrhea.Pt has been cleaned and barrier cream has been applied to affected areas.

## 2016-12-08 NOTE — ED Notes (Signed)
Patient has had 6 loose bm's since arriving at facility. Patient very red and irritated on bottom.

## 2016-12-08 NOTE — ED Triage Notes (Signed)
Pt coming from home complaining of diarrhea x 2 weeks. Pt has been taking antibiotic for last couple days. Pt reports having little control of bladder and bowel at this time.

## 2016-12-09 ENCOUNTER — Encounter (HOSPITAL_COMMUNITY): Payer: Self-pay | Admitting: Internal Medicine

## 2016-12-09 ENCOUNTER — Emergency Department (HOSPITAL_COMMUNITY): Payer: Medicare Other

## 2016-12-09 DIAGNOSIS — R197 Diarrhea, unspecified: Secondary | ICD-10-CM

## 2016-12-09 LAB — COMPREHENSIVE METABOLIC PANEL
ALT: 10 U/L — AB (ref 14–54)
AST: 20 U/L (ref 15–41)
Albumin: 3.4 g/dL — ABNORMAL LOW (ref 3.5–5.0)
Alkaline Phosphatase: 52 U/L (ref 38–126)
Anion gap: 10 (ref 5–15)
BUN: 28 mg/dL — ABNORMAL HIGH (ref 6–20)
CHLORIDE: 113 mmol/L — AB (ref 101–111)
CO2: 23 mmol/L (ref 22–32)
Calcium: 8.5 mg/dL — ABNORMAL LOW (ref 8.9–10.3)
Creatinine, Ser: 2.53 mg/dL — ABNORMAL HIGH (ref 0.44–1.00)
GFR, EST AFRICAN AMERICAN: 19 mL/min — AB (ref >60)
GFR, EST NON AFRICAN AMERICAN: 16 mL/min — AB (ref >60)
Glucose, Bld: 98 mg/dL (ref 65–99)
POTASSIUM: 3.3 mmol/L — AB (ref 3.5–5.1)
SODIUM: 146 mmol/L — AB (ref 135–145)
Total Bilirubin: 0.4 mg/dL (ref 0.3–1.2)
Total Protein: 6.1 g/dL — ABNORMAL LOW (ref 6.5–8.1)

## 2016-12-09 LAB — C DIFFICILE QUICK SCREEN W PCR REFLEX
C Diff antigen: NEGATIVE
C Diff interpretation: NOT DETECTED
C Diff toxin: NEGATIVE

## 2016-12-09 LAB — BASIC METABOLIC PANEL
Anion gap: 9 (ref 5–15)
BUN: 25 mg/dL — ABNORMAL HIGH (ref 6–20)
CHLORIDE: 116 mmol/L — AB (ref 101–111)
CO2: 22 mmol/L (ref 22–32)
Calcium: 8 mg/dL — ABNORMAL LOW (ref 8.9–10.3)
Creatinine, Ser: 2.29 mg/dL — ABNORMAL HIGH (ref 0.44–1.00)
GFR calc non Af Amer: 18 mL/min — ABNORMAL LOW (ref >60)
GFR, EST AFRICAN AMERICAN: 21 mL/min — AB (ref >60)
Glucose, Bld: 91 mg/dL (ref 65–99)
Potassium: 3.1 mmol/L — ABNORMAL LOW (ref 3.5–5.1)
SODIUM: 147 mmol/L — AB (ref 135–145)

## 2016-12-09 LAB — CBC WITH DIFFERENTIAL/PLATELET
BASOS ABS: 0 10*3/uL (ref 0.0–0.1)
Basophils Relative: 1 %
EOS ABS: 0.1 10*3/uL (ref 0.0–0.7)
EOS PCT: 3 %
HCT: 34.4 % — ABNORMAL LOW (ref 36.0–46.0)
Hemoglobin: 11 g/dL — ABNORMAL LOW (ref 12.0–15.0)
LYMPHS PCT: 32 %
Lymphs Abs: 1.4 10*3/uL (ref 0.7–4.0)
MCH: 29.8 pg (ref 26.0–34.0)
MCHC: 32 g/dL (ref 30.0–36.0)
MCV: 93.2 fL (ref 78.0–100.0)
MONO ABS: 0.4 10*3/uL (ref 0.1–1.0)
Monocytes Relative: 9 %
Neutro Abs: 2.4 10*3/uL (ref 1.7–7.7)
Neutrophils Relative %: 55 %
PLATELETS: 218 10*3/uL (ref 150–400)
RBC: 3.69 MIL/uL — AB (ref 3.87–5.11)
RDW: 14.6 % (ref 11.5–15.5)
WBC: 4.3 10*3/uL (ref 4.0–10.5)

## 2016-12-09 LAB — CBC
HEMATOCRIT: 31.3 % — AB (ref 36.0–46.0)
HEMOGLOBIN: 10.2 g/dL — AB (ref 12.0–15.0)
MCH: 30.3 pg (ref 26.0–34.0)
MCHC: 32.6 g/dL (ref 30.0–36.0)
MCV: 92.9 fL (ref 78.0–100.0)
Platelets: 195 10*3/uL (ref 150–400)
RBC: 3.37 MIL/uL — ABNORMAL LOW (ref 3.87–5.11)
RDW: 14.6 % (ref 11.5–15.5)
WBC: 4.6 10*3/uL (ref 4.0–10.5)

## 2016-12-09 LAB — GLUCOSE, CAPILLARY
GLUCOSE-CAPILLARY: 96 mg/dL (ref 65–99)
GLUCOSE-CAPILLARY: 135 mg/dL — AB (ref 65–99)
Glucose-Capillary: 131 mg/dL — ABNORMAL HIGH (ref 65–99)

## 2016-12-09 LAB — CBG MONITORING, ED: GLUCOSE-CAPILLARY: 101 mg/dL — AB (ref 65–99)

## 2016-12-09 MED ORDER — TIMOLOL MALEATE 0.5 % OP SOLN
1.0000 [drp] | Freq: Every day | OPHTHALMIC | Status: DC
  Administered 2016-12-09 – 2016-12-11 (×3): 1 [drp] via OPHTHALMIC
  Filled 2016-12-09: qty 5

## 2016-12-09 MED ORDER — EZETIMIBE-SIMVASTATIN 10-20 MG PO TABS
1.0000 | ORAL_TABLET | Freq: Every day | ORAL | Status: DC
Start: 2016-12-09 — End: 2016-12-09

## 2016-12-09 MED ORDER — CLONIDINE HCL 0.1 MG PO TABS
0.1000 mg | ORAL_TABLET | Freq: Every day | ORAL | Status: DC
  Administered 2016-12-09 – 2016-12-11 (×3): 0.1 mg via ORAL
  Filled 2016-12-09 (×3): qty 1

## 2016-12-09 MED ORDER — HYDRALAZINE HCL 20 MG/ML IJ SOLN
10.0000 mg | INTRAMUSCULAR | Status: DC | PRN
  Administered 2016-12-09: 10 mg via INTRAVENOUS
  Filled 2016-12-09: qty 1

## 2016-12-09 MED ORDER — ONDANSETRON HCL 4 MG/2ML IJ SOLN: 4.0000 mg | Freq: Four times a day (QID) | INTRAMUSCULAR | Status: DC | PRN

## 2016-12-09 MED ORDER — SODIUM CHLORIDE 0.9 % IV BOLUS (SEPSIS)
500.0000 mL | Freq: Once | INTRAVENOUS | Status: AC
  Administered 2016-12-09: 500 mL via INTRAVENOUS

## 2016-12-09 MED ORDER — ACETAMINOPHEN 650 MG RE SUPP: 650.0000 mg | Freq: Four times a day (QID) | RECTAL | Status: DC | PRN

## 2016-12-09 MED ORDER — NEBIVOLOL HCL 10 MG PO TABS
20.0000 mg | ORAL_TABLET | Freq: Every day | ORAL | Status: DC
  Administered 2016-12-09 – 2016-12-11 (×3): 20 mg via ORAL
  Filled 2016-12-09 (×3): qty 2

## 2016-12-09 MED ORDER — ONDANSETRON HCL 4 MG PO TABS: 4.0000 mg | ORAL_TABLET | Freq: Four times a day (QID) | ORAL | Status: DC | PRN

## 2016-12-09 MED ORDER — INSULIN ASPART 100 UNIT/ML ~~LOC~~ SOLN
0.0000 [IU] | Freq: Three times a day (TID) | SUBCUTANEOUS | Status: DC
  Administered 2016-12-09: 1 [IU] via SUBCUTANEOUS
  Administered 2016-12-10: 2 [IU] via SUBCUTANEOUS
  Administered 2016-12-10: 1 [IU] via SUBCUTANEOUS

## 2016-12-09 MED ORDER — HEPARIN SODIUM (PORCINE) 5000 UNIT/ML IJ SOLN
5000.0000 [IU] | Freq: Three times a day (TID) | INTRAMUSCULAR | Status: DC
  Administered 2016-12-09 – 2016-12-10 (×5): 5000 [IU] via SUBCUTANEOUS
  Filled 2016-12-09 (×5): qty 1

## 2016-12-09 MED ORDER — ASPIRIN EC 81 MG PO TBEC
81.0000 mg | DELAYED_RELEASE_TABLET | Freq: Every day | ORAL | Status: DC
  Administered 2016-12-09 – 2016-12-11 (×3): 81 mg via ORAL
  Filled 2016-12-09 (×3): qty 1

## 2016-12-09 MED ORDER — ORAL CARE MOUTH RINSE
15.0000 mL | Freq: Two times a day (BID) | OROMUCOSAL | Status: DC
  Administered 2016-12-09 – 2016-12-11 (×5): 15 mL via OROMUCOSAL

## 2016-12-09 MED ORDER — EZETIMIBE 10 MG PO TABS
5.0000 mg | ORAL_TABLET | Freq: Every day | ORAL | Status: DC
  Administered 2016-12-09 – 2016-12-10 (×2): 5 mg via ORAL
  Filled 2016-12-09 (×2): qty 1

## 2016-12-09 MED ORDER — ACETAMINOPHEN 325 MG PO TABS: 650.0000 mg | ORAL_TABLET | Freq: Four times a day (QID) | ORAL | Status: DC | PRN

## 2016-12-09 MED ORDER — SODIUM CHLORIDE 0.9 % IV SOLN
INTRAVENOUS | Status: AC
  Administered 2016-12-09 – 2016-12-10 (×2): via INTRAVENOUS

## 2016-12-09 MED ORDER — ENSURE ENLIVE PO LIQD
237.0000 mL | Freq: Two times a day (BID) | ORAL | Status: DC
  Administered 2016-12-09 – 2016-12-11 (×4): 237 mL via ORAL

## 2016-12-09 MED ORDER — SIMVASTATIN 10 MG PO TABS
10.0000 mg | ORAL_TABLET | Freq: Every day | ORAL | Status: DC
  Administered 2016-12-09 – 2016-12-10 (×2): 10 mg via ORAL
  Filled 2016-12-09 (×2): qty 1

## 2016-12-09 NOTE — ED Notes (Signed)
Shanda Bumps [phone] accepting RN. Report scheduled for [phone]

## 2016-12-09 NOTE — Progress Notes (Signed)
Patient is seen and  Examined, she is admitted this am, vital stable ,denies pain, no diarrhea since admitted, no stool sample collected.

## 2016-12-09 NOTE — ED Notes (Signed)
[phone] [name](daughter)

## 2016-12-09 NOTE — ED Notes (Signed)
Pt was able to urinate in the female urinal, but I poured it out thinking there wasn't a urine sample ordered.

## 2016-12-09 NOTE — Progress Notes (Signed)
Initial Nutrition Assessment  DOCUMENTATION CODES:   Not applicable  INTERVENTION:   Ensure Enlive po BID, each supplement provides 350 kcal and 20 grams of protein  NUTRITION DIAGNOSIS:   Inadequate oral intake related to altered GI function, poor appetite as evidenced by per patient/family report.  GOAL:   Patient will meet greater than or equal to 90% of their needs  MONITOR:   PO intake, Supplement acceptance, Weight trends, Labs  REASON FOR ASSESSMENT:   Malnutrition Screening Tool    ASSESSMENT:   Pt with PMH significant for HTN, HLD, CKD III, CAD s/p stent, CHFdiastolic, and DM. Presents this admission with severe diarrhea and uncontrolled hypertension.    Spoke with pt at bedside who reports having loss in appetite for two weeks prior to admission related to severe diarrhea. Spoke with RN, who reports pt was afraid to eat because of diarrhea symptoms. Observed pt eating eggs upon visit. Pt agreeable to supplementation. Will monitor for PO toleration and supplement acceptance. RD obtained bedside wt. Records indicate pt has maintained a stable wt of  130-139 since May 2018.  Nutrition-Focused physical exam completed. Findings are no fat depletion, mild muscle depletion, and mild edema. Suspect bilateral upper extremity muscle depletion is related to aging process, do not suspect malnutrition at this time.   Medications reviewed and include: SSI, NS @ 75 ml/hr Labs reviewed: Na 147 (L) K 3.1 (H) BUN 25 (H) Creatinine 2.29 (H) Albumin 3.4 (L)  Diet Order:  Diet heart healthy/carb modified Room service appropriate? Yes with Assist; Fluid consistency: Thin  Skin:  Reviewed, no issues  Last BM:  12/09/16  Height:   Ht Readings from Last 1 Encounters:  12/09/16 5' (1.524 m)    Weight:   Wt Readings from Last 1 Encounters:  12/09/16 139 lb 8.8 oz (63.3 kg)    Ideal Body Weight:  45.5 kg  BMI:  Body mass index is 27.25 kg/m.  Estimated Nutritional Needs:    Kcal:  1200-1400 (26-31 kcal/kg IBW)  Protein:  65-75 grams (1.4-1.6 g/kg)  Fluid:  >1.2 L/day  EDUCATION NEEDS:   No education needs identified at this time  Vanessa Kick RD, LDN Clinical Nutrition Pager # - [phone]

## 2016-12-09 NOTE — H&P (Signed)
History and Physical    [name] ZOX:[id number] DOB: [birth year] DOA: 12/08/2016  PCP: Ashley Boys, DO  Patient coming from: Home.  Chief Complaint: Diarrhea.  HPI: [name] is a 83 y.o. female with history of hypertension, chronic kidney disease stage 3-4, CAD status post stenting, diastolic dysfunction presents to the ER with complaints of severe diarrhea. Patient was recently placed on antibiotics for UTI. Urine cultures done in the first week of September shows Klebsiella pneumonia. Patient states over the last couple of weeks patient has been exceedingly diarrhea and last 24 hours had worsening. Patient was given Kayexalate for unknown reason and has been taking it yesterday which worsened the patient's diarrhea. Denies abdominal pain nausea vomiting. Has been having poor appetite.   ED Course: In the ER acute abdominal series was unremarkable. Patient has been admitted for further observation.  Review of Systems: As per HPI, rest all negative.   Past Medical History:  Diagnosis Date  . Anemia   . Carotid artery occlusion   . Carotid bruit   . Chronic kidney disease (CKD), stage III (moderate)   . Coronary artery disease    Remote PCI. Had BMS to RCA and DES stent to Diagonal in Jan. 2012   . Diastolic heart failure    EF 55 to 60% per cath in Jan 2012  . DVT (deep venous thrombosis) (HCC)   . Gout   . Hyperlipidemia   . Hypertension   . Ischemic cardiomyopathy    with prior EF of 35%  . NSTEMI (non-ST elevated myocardial infarction) [facility]) Jan 2012   with PCI to RCA and DX per Dr. Excell Seltzer  . Obesity   . Pneumonia 2016  . Proteinuria   . Stroke [name]) 2013   ?  mini    . TIA (transient ischemic attack)   . Type II diabetes mellitus (HCC)    long standing    Past Surgical History:  Procedure Laterality Date  . ABDOMINAL AORTAGRAM N/A 01/17/2014   Procedure: ABDOMINAL Ronny Flurry;  Surgeon: Ashley Kerns, MD;  Location: [facility] CATH LAB;  Service:  Cardiovascular;  Laterality: N/A;  . ANKLE FUSION Bilateral   . APPENDECTOMY    . CATARACT EXTRACTION, BILATERAL Bilateral   . CORONARY ANGIOPLASTY WITH STENT PLACEMENT  04/05/2010   distal RCA  . FRACTURE SURGERY Right 2005   bilateral ankles   . TONSILLECTOMY       reports that she has quit smoking. She has never used smokeless tobacco. She reports that she does not drink alcohol or use drugs.  Allergies  Allergen Reactions  . Namzaric [Memantine Hcl-Donepezil Hcl] Nausea Only    confusion  . Percocet [Oxycodone-Acetaminophen] Other (See Comments)    loosy goosy  . Sulfa Antibiotics Other (See Comments)    Tongue swells  . Adhesive [Tape] Other (See Comments)    Tears skin.  Please use "paper" tape  . Metformin And Related Diarrhea    Abnormal Kidney Functions   . Tradjenta [Linagliptin]     Low back ache, resolved once medication stopped   . Penicillins Hives    Tolerates Ceftriaxone, Has patient had a PCN reaction causing immediate rash, facial/tongue/throat swelling, SOB or lightheadedness with hypotension: Unknown Has patient had a PCN reaction causing severe rash involving mucus membranes or skin necrosis: No Has patient had a PCN reaction that required hospitalization No Has patient had a PCN reaction occurring within the last 10 years: No If all of the above answers are "  NO", then may proceed with Cephalosporin use.     History reviewed. No pertinent family history.  Prior to Admission medications   Medication Sig Start Date End Date Taking? Authorizing Provider  aspirin EC 81 MG tablet Take 81 mg by mouth daily.    [provider]  ciprofloxacin (CIPRO) 500 MG tablet Take 1 tablet (500 mg total) by mouth 2 (two) times daily. 11/26/16   Jacalyn Lefevre, MD  cloNIDine (CATAPRES) 0.1 MG tablet Take 1 tablet (0.1 mg total) by mouth daily. For high blood pressure 09/28/16   Ashley Boys, DO  docusate sodium (COLACE) 100 MG capsule Take 1 capsule (100 mg total)  by mouth 2 (two) times daily as needed for mild constipation. 08/24/16   Ashley Boys, DO  ezetimibe-simvastatin (VYTORIN) 10-20 MG tablet Take 1/2 tablet by mouth once nightly to control cholesterol 09/28/16   Ashley Boys, DO  furosemide (LASIX) 20 MG tablet Take 1 tablet (20 mg total) by mouth 2 (two) times daily. For Hypertension 09/28/16   Ashley Boys, DO  losartan (COZAAR) 50 MG tablet Take 1 tablet (50 mg total) by mouth daily. for high blood pressure 09/28/16   Montez Morita, [place], DO  Loteprednol Etabonate (LOTEMAX) 0.5 % GEL PLACE 1 DROP IN THE LEFT EYE TWICE DAILY 09/28/16   Ashley Boys, DO  metroNIDAZOLE (FLAGYL) 500 MG tablet Take 1 tablet (500 mg total) by mouth 2 (two) times daily. 11/26/16   Jacalyn Lefevre, MD  Nebivolol HCl (BYSTOLIC) 20 MG TABS Take 1 tablet (20 mg total) by mouth daily. For Blood Pressure 09/28/16   Ashley Boys, DO  nitroGLYCERIN (NITROSTAT) 0.4 MG SL tablet Place one under tongue for chest pain, up to 3 tablets 09/28/16   Ashley Boys, DO  polyethylene glycol powder (GLYCOLAX/MIRALAX) powder DISSOLVE 17 GRAMS (1 CAPFUL) INTO 8 OUNCES OF LIQUID AND DRINK EVERY DAY AS NEEDED FOR BOWELS 09/28/16   Ashley Boys, DO  sitaGLIPtin (JANUVIA) 100 MG tablet Take 1 tablet (100 mg total) by mouth daily. For Diabetes 09/28/16   Ashley Boys, DO  sodium polystyrene (KAYEXALATE) 15 GM/60ML suspension 30gm po BID x 2 doses 12/07/16   Ashley Boys, DO  timolol (BETIMOL) 0.5 % ophthalmic solution INSTILL 1 DROP INTO THE LEFT EYE DAILY 09/28/16   Ashley Boys, DO    Physical Exam: Vitals:   12/08/16 2254 12/09/16 0122 12/09/16 0130  BP: (!) 225/69 (!) 140/104 (!) 193/61  Pulse: 63 64 63  Resp: 18 16   Temp: 98.1 F (36.7 C)    TempSrc: Oral    SpO2: 97% 93% 95%  Weight: 49.9 kg (110 lb)    Height: 5' (1.524 m)        Constitutional: Moderately built and nourished. Vitals:   12/08/16 2254 12/09/16 0122 12/09/16 0130  BP: (!) 225/69 (!) 140/104 (!) 193/61    Pulse: 63 64 63  Resp: 18 16   Temp: 98.1 F (36.7 C)    TempSrc: Oral    SpO2: 97% 93% 95%  Weight: 49.9 kg (110 lb)    Height: 5' (1.524 m)     Eyes: Anicteric no pallor. ENMT: No discharge from the ears eyes nose and mouth. Neck: No mass felt. No neck rigidity. Respiratory: No rhonchi or crepitations. Cardiovascular: S1-S2. No murmurs appreciated. Abdomen: Soft nontender bowel sounds present. Musculoskeletal: No edema. No joint effusion. Skin: No rash. Skin appears warm. Neurologic: Alert awake oriented to time place and person. Moves all extremities. Psychiatric: Appears normal. Normal affect.   Labs  on Admission: I have personally reviewed following labs and imaging studies  CBC:  Recent Labs Lab 12/09/16 0056  WBC 4.3  NEUTROABS 2.4  HGB 11.0*  HCT 34.4*  MCV 93.2  PLT 218   Basic Metabolic Panel:  Recent Labs Lab 12/06/16 1410 12/09/16 0056  NA 139 146*  K 5.4* 3.3*  CL 108 113*  CO2 22 23  GLUCOSE 123 98  BUN 33* 28*  CREATININE 2.88* 2.53*  CALCIUM 8.5* 8.5*   GFR: Estimated Creatinine Clearance: 11.3 mL/min (A) (by C-G formula based on SCr of 2.53 mg/dL (H)). Liver Function Tests:  Recent Labs Lab 12/09/16 0056  AST 20  ALT 10*  ALKPHOS 52  BILITOT 0.4  PROT 6.1*  ALBUMIN 3.4*   No results for input(s): LIPASE, AMYLASE in the last 168 hours. No results for input(s): AMMONIA in the last 168 hours. Coagulation Profile: No results for input(s): INR, PROTIME in the last 168 hours. Cardiac Enzymes: No results for input(s): CKTOTAL, CKMB, CKMBINDEX, TROPONINI in the last 168 hours. BNP (last 3 results) No results for input(s): PROBNP in the last 8760 hours. HbA1C: No results for input(s): HGBA1C in the last 72 hours. CBG: No results for input(s): GLUCAP in the last 168 hours. Lipid Profile: No results for input(s): CHOL, HDL, LDLCALC, TRIG, CHOLHDL, LDLDIRECT in the last 72 hours. Thyroid Function Tests: No results for input(s):  TSH, T4TOTAL, FREET4, T3FREE, THYROIDAB in the last 72 hours. Anemia Panel: No results for input(s): VITAMINB12, FOLATE, FERRITIN, TIBC, IRON, RETICCTPCT in the last 72 hours. Urine analysis:    Component Value Date/Time   COLORURINE YELLOW 11/26/2016 1650   APPEARANCEUR CLOUDY (A) 11/26/2016 1650   APPEARANCEUR Clear 02/21/2013 1622   LABSPEC 1.011 11/26/2016 1650   PHURINE 6.0 11/26/2016 1650   GLUCOSEU NEGATIVE 11/26/2016 1650   HGBUR SMALL (A) 11/26/2016 1650   BILIRUBINUR NEGATIVE 11/26/2016 1650   BILIRUBINUR Negative 02/21/2013 1622   KETONESUR NEGATIVE 11/26/2016 1650   PROTEINUR 100 (A) 11/26/2016 1650   UROBILINOGEN 0.2 12/23/2014 1513   NITRITE NEGATIVE 11/26/2016 1650   LEUKOCYTESUR LARGE (A) 11/26/2016 1650   LEUKOCYTESUR Negative 02/21/2013 1622   Sepsis Labs: (procalcitonin:4,lacticidven:4) )No results found for this or any previous visit (from the past 240 hour(s)).   Radiological Exams on Admission: Dg Abd Acute W/chest  Result Date: 12/09/2016 CLINICAL DATA:  Diarrhea EXAM: DG ABDOMEN ACUTE W/ 1V CHEST COMPARISON:  Chest and abdominal radiograph 11/26/2016 FINDINGS: Unchanged cardiomegaly and calcific aortic atherosclerosis. Chronic interstitial opacities are unchanged. No pleural effusion or pneumothorax. No focal consolidation. The no free intraperitoneal air. No dilated small bowel. Calcified fibroid in the pelvis. Bilateral iliac stents. IMPRESSION: 1. Unchanged appearance of the chest with chronic interstitial opacities without acute cardiopulmonary disease. 2. No free intraperitoneal air or radiographic evidence of small-bowel obstruction. Electronically Signed   By: Deatra Robinson M.D.   On: 12/09/2016 02:36     Assessment/Plan Principal Problem:   Diarrhea Active Problems:   Coronary artery disease   Chronic diastolic heart failure, NYHA class 1 (HCC)   Hypertension   Diabetes mellitus with nephropathy (HCC)   PAD (peripheral artery disease)  (HCC)   Chronic kidney disease, stage III (moderate)   Dementia without behavioral disturbance    1. Severe diarrhea - patient has been having diarrhea in the ER. Has been watery. Check stool for C. difficile and GI pathogen panel. Gently hydrate. 2. Hypertension uncontrolled - in addition to patient's home medications clonidine and Bystolic we will  keep patient on when necessary IV hydralazine. 3. CAD - denies any chest pain. Continue aspirin and Vytorin and Bystolic. 4. Chronic kidney disease stage 3-4 - closely follow metabolic panel. 5. Diabetes mellitus type 2 - follow CBGs with sliding scale coverage. Patient is on large doses of Januvia for renal failure which will be held. 6. Chronic diastolic CHF - presently due to renal failure will hold any diuretics and gently hydrate. Closely follow respiratory status. 7. History of dementia.   DVT prophylaxis: Heparin. Code Status: DO NOT RESUSCITATE.  Family Communication: No family at the bedside.  Disposition Plan: Home.  Consults called: None.  Admission status: Observation.    Eduard Clos MD [facility] Pager [phone].  If 7PM-7AM, please contact night-coverage [email or web address] Password [email or web address]  12/09/2016, 3:01 AM

## 2016-12-09 NOTE — ED Provider Notes (Signed)
[facility]-EMERGENCY DEPT Provider Note   CSN: [id number] Arrival date & time: 12/08/16  2221     History   Chief Complaint Chief Complaint  Patient presents with  . Diarrhea    HPI [name] is a 83 y.o. female.  The history is provided by the patient.  Diarrhea   This is a new problem. The current episode started more than 1 week ago. The problem occurs continuously. The problem has not changed since onset.The stool consistency is described as watery. There has been no fever. Pertinent negatives include no abdominal pain, no vomiting, no chills, no sweats, no headaches, no arthralgias, no myalgias, no URI and no cough. She has tried nothing for the symptoms. The treatment provided no relief. Her past medical history does not include bowel resection.    Past Medical History:  Diagnosis Date  . Anemia   . Carotid artery occlusion   . Carotid bruit   . Chronic kidney disease (CKD), stage III (moderate)   . Coronary artery disease    Remote PCI. Had BMS to RCA and DES stent to Diagonal in Jan. 2012   . Diastolic heart failure    EF 55 to 60% per cath in Jan 2012  . DVT (deep venous thrombosis) (HCC)   . Gout   . Hyperlipidemia   . Hypertension   . Ischemic cardiomyopathy    with prior EF of 35%  . NSTEMI (non-ST elevated myocardial infarction) [facility]) Jan 2012   with PCI to RCA and DX per Dr. Excell Seltzer  . Obesity   . Pneumonia 2016  . Proteinuria   . Stroke [facility]) 2013   ?  mini    . TIA (transient ischemic attack)   . Type II diabetes mellitus (HCC)    long standing    Patient Active Problem List   Diagnosis Date Noted  . Delusions (HCC) 09/28/2016  . Medication noncompliance due to cognitive impairment 09/28/2016  . Hemorrhoids 05/04/2016  . Pain in joint, lower leg 01/20/2016  . Ptosis 11/04/2015  . Glaucoma 08/25/2015  . Dementia without behavioral disturbance 06/10/2015  . Expressive aphasia 05/26/2015  . Anal prolapse 02/25/2015  . Chronic kidney disease,  stage III (moderate) 02/10/2014  . PAD (peripheral artery disease) (HCC) 01/17/2014  . Constipation 07/31/2013  . Atherosclerosis of native artery of extremity with intermittent claudication (HCC) 07/04/2013  . Anxiety disorder 02/21/2013  . Diabetes mellitus with nephropathy (HCC) 07/05/2012  . Occlusion and stenosis of carotid artery without mention of cerebral infarction 06/21/2012  . Coronary artery disease   . Chronic diastolic heart failure, NYHA class 1 (HCC)   . Hyperlipidemia   . Hypertension   . Carotid bruit   . Gout   . Obesity   . TIA (transient ischemic attack)     Past Surgical History:  Procedure Laterality Date  . ABDOMINAL AORTAGRAM N/A 01/17/2014   Procedure: ABDOMINAL Ronny Flurry;  Surgeon: Sherren Kerns, MD;  Location: [facility] CATH LAB;  Service: Cardiovascular;  Laterality: N/A;  . ANKLE FUSION Bilateral   . APPENDECTOMY    . CATARACT EXTRACTION, BILATERAL Bilateral   . CORONARY ANGIOPLASTY WITH STENT PLACEMENT  04/05/2010   distal RCA  . FRACTURE SURGERY Right 2005   bilateral ankles   . TONSILLECTOMY      OB History    No data available       Home Medications    Prior to Admission medications   Medication Sig Start Date End Date Taking? Authorizing Provider  aspirin EC 81 MG tablet Take 81 mg by mouth daily.    [provider]  ciprofloxacin (CIPRO) 500 MG tablet Take 1 tablet (500 mg total) by mouth 2 (two) times daily. 11/26/16   Jacalyn Lefevre, MD  cloNIDine (CATAPRES) 0.1 MG tablet Take 1 tablet (0.1 mg total) by mouth daily. For high blood pressure 09/28/16   Kirt Boys, DO  docusate sodium (COLACE) 100 MG capsule Take 1 capsule (100 mg total) by mouth 2 (two) times daily as needed for mild constipation. 08/24/16   Kirt Boys, DO  ezetimibe-simvastatin (VYTORIN) 10-20 MG tablet Take 1/2 tablet by mouth once nightly to control cholesterol 09/28/16   Kirt Boys, DO  furosemide (LASIX) 20 MG tablet Take 1 tablet (20 mg total) by  mouth 2 (two) times daily. For Hypertension 09/28/16   Kirt Boys, DO  losartan (COZAAR) 50 MG tablet Take 1 tablet (50 mg total) by mouth daily. for high blood pressure 09/28/16   Montez Morita, [place], DO  Loteprednol Etabonate (LOTEMAX) 0.5 % GEL PLACE 1 DROP IN THE LEFT EYE TWICE DAILY 09/28/16   Kirt Boys, DO  metroNIDAZOLE (FLAGYL) 500 MG tablet Take 1 tablet (500 mg total) by mouth 2 (two) times daily. 11/26/16   Jacalyn Lefevre, MD  Nebivolol HCl (BYSTOLIC) 20 MG TABS Take 1 tablet (20 mg total) by mouth daily. For Blood Pressure 09/28/16   Kirt Boys, DO  nitroGLYCERIN (NITROSTAT) 0.4 MG SL tablet Place one under tongue for chest pain, up to 3 tablets 09/28/16   Kirt Boys, DO  polyethylene glycol powder (GLYCOLAX/MIRALAX) powder DISSOLVE 17 GRAMS (1 CAPFUL) INTO 8 OUNCES OF LIQUID AND DRINK EVERY DAY AS NEEDED FOR BOWELS 09/28/16   Kirt Boys, DO  sitaGLIPtin (JANUVIA) 100 MG tablet Take 1 tablet (100 mg total) by mouth daily. For Diabetes 09/28/16   Kirt Boys, DO  sodium polystyrene (KAYEXALATE) 15 GM/60ML suspension 30gm po BID x 2 doses 12/07/16   Kirt Boys, DO  timolol (BETIMOL) 0.5 % ophthalmic solution INSTILL 1 DROP INTO THE LEFT EYE DAILY 09/28/16   Kirt Boys, DO    Family History No family history on file.  Social History Social History  Substance Use Topics  . Smoking status: Former Games developer  . Smokeless tobacco: Never Used     Comment: "smoked in my teens; quit after 1 year or so"  . Alcohol use No     Allergies   Namzaric [memantine hcl-donepezil hcl]; Percocet [oxycodone-acetaminophen]; Sulfa antibiotics; Adhesive [tape]; Metformin and related; Tradjenta [linagliptin]; and Penicillins   Review of Systems Review of Systems  Constitutional: Negative for chills.  Respiratory: Negative for cough.   Gastrointestinal: Positive for diarrhea. Negative for abdominal pain and vomiting.  Musculoskeletal: Negative for arthralgias and myalgias.    Neurological: Negative for headaches.  All other systems reviewed and are negative.    Physical Exam Updated Vital Signs BP (!) 193/61   Pulse 63   Temp 98.1 F (36.7 C) (Oral)   Resp 16   Ht 5' (1.524 m)   Wt 49.9 kg (110 lb)   SpO2 95%   BMI 21.48 kg/m   Physical Exam  Constitutional: She is oriented to person, place, and time. She appears well-developed and well-nourished. No distress.  HENT:  Head: Normocephalic and atraumatic.  Nose: Nose normal.  Mouth/Throat: No oropharyngeal exudate.  Eyes: Pupils are equal, round, and reactive to light. Conjunctivae are normal.  Neck: Normal range of motion. Neck supple.  Cardiovascular: Normal rate, regular rhythm, normal heart  sounds and intact distal pulses.   Pulmonary/Chest: Effort normal and breath sounds normal. She has no wheezes. She has no rales.  Abdominal: Soft. Bowel sounds are normal. She exhibits no mass. There is no tenderness. There is no rebound and no guarding.  Musculoskeletal: Normal range of motion.  Neurological: She is alert and oriented to person, place, and time.  Skin: Skin is warm and dry. Capillary refill takes less than 2 seconds.  Psychiatric: She has a normal mood and affect.     ED Treatments / Results   Vitals:   12/09/16 0122 12/09/16 0130  BP: (!) 140/104 (!) 193/61  Pulse: 64 63  Resp: 16   Temp:    SpO2: 93% 95%    Labs (all labs ordered are listed, but only abnormal results ar.edthis e displayed) Results for orders placed or performed during the hospital encounter of 12/08/16  CBC with Differential/Platelet  Result Value Ref Range   WBC 4.3 4.0 - 10.5 K/uL   RBC 3.69 (L) 3.87 - 5.11 MIL/uL   Hemoglobin 11.0 (L) 12.0 - 15.0 g/dL   HCT 16.1 (L) 09.6 - 04.5 %   MCV 93.2 78.0 - 100.0 fL   MCH 29.8 26.0 - 34.0 pg   MCHC 32.0 30.0 - 36.0 g/dL   RDW 40.9 81.1 - 91.4 %   Platelets 218 150 - 400 K/uL   Neutrophils Relative % 55 %   Neutro Abs 2.4 1.7 - 7.7 K/uL   Lymphocytes  Relative 32 %   Lymphs Abs 1.4 0.7 - 4.0 K/uL   Monocytes Relative 9 %   Monocytes Absolute 0.4 0.1 - 1.0 K/uL   Eosinophils Relative 3 %   Eosinophils Absolute 0.1 0.0 - 0.7 K/uL   Basophils Relative 1 %   Basophils Absolute 0.0 0.0 - 0.1 K/uL  Comprehensive metabolic panel  Result Value Ref Range   Sodium 146 (H) 135 - 145 mmol/L   Potassium 3.3 (L) 3.5 - 5.1 mmol/L   Chloride 113 (H) 101 - 111 mmol/L   CO2 23 22 - 32 mmol/L   Glucose, Bld 98 65 - 99 mg/dL   BUN 28 (H) 6 - 20 mg/dL   Creatinine, Ser 7.82 (H) 0.44 - 1.00 mg/dL   Calcium 8.5 (L) 8.9 - 10.3 mg/dL   Total Protein 6.1 (L) 6.5 - 8.1 g/dL   Albumin 3.4 (L) 3.5 - 5.0 g/dL   AST 20 15 - 41 U/L   ALT 10 (L) 14 - 54 U/L   Alkaline Phosphatase 52 38 - 126 U/L   Total Bilirubin 0.4 0.3 - 1.2 mg/dL   GFR calc non Af Amer 16 (L) >60 mL/min   GFR calc Af Amer 19 (L) >60 mL/min   Anion gap 10 5 - 15   Ct Abdomen Pelvis Wo Contrast  Result Date: 11/26/2016 CLINICAL DATA:  Rectal pain and constipation EXAM: CT ABDOMEN AND PELVIS WITHOUT CONTRAST TECHNIQUE: Multidetector CT imaging of the abdomen and pelvis was performed following the standard protocol without IV contrast. COMPARISON:  11/26/2016, CT 08/17/2016 FINDINGS: Lower chest: No consolidation or effusion. Scattered ground-glass densities in the lower lobes. Mild lower lobe bronchial thickening and interstitial densities. Mild cardiomegaly. Coronary artery calcification. Hepatobiliary: No focal liver abnormality is seen. No gallstones, gallbladder wall thickening, or biliary dilatation. Pancreas: Unremarkable. No pancreatic ductal dilatation or surrounding inflammatory changes. Spleen: Normal in size without focal abnormality. Adrenals/Urinary Tract: No hydronephrosis. 3 mm stone at the left renal hilus could be vascular or  a small stone in the pelvis. Normal bladder Stomach/Bowel: Stomach is nonenlarged. No dilated small bowel. Possible mild thickening of the rectum. Mild to  moderate stool. Nonvisualized appendix Vascular/Lymphatic: Extensive vascular calcifications of the aorta and branch vessels. No aneurysmal dilatation. Bi-iliac stents. No significantly enlarged lymph nodes Reproductive: Calcified uterine fibroid. Stable 3 cm cyst left at adnexa Other: Negative for free air or free fluid. Small fat in the umbilicus. Musculoskeletal: Marked degenerative changes of the spine. No acute or suspicious lesion IMPRESSION: 1. Negative for a bowel obstruction. Stool burden is mild to moderate. Possible mild wall thickening of the rectum, could indicate mild proctitis. 2. Scattered ground-glass densities in the lower lobes which may reflect atelectasis. Increased lower lobe bronchial thickening and interstitial densities suggesting bronchitis or possible viral illness 3. 3 mm stone versus intravascular calcification at the left renal hilus. 4. Calcified uterine fibroid.  Stable 3 cm cyst at the left adnexa Electronically Signed   By: Jasmine Pang M.D.   On: 11/26/2016 19:30   Dg Abd Acute W/chest  Result Date: 12/09/2016 CLINICAL DATA:  Diarrhea EXAM: DG ABDOMEN ACUTE W/ 1V CHEST COMPARISON:  Chest and abdominal radiograph 11/26/2016 FINDINGS: Unchanged cardiomegaly and calcific aortic atherosclerosis. Chronic interstitial opacities are unchanged. No pleural effusion or pneumothorax. No focal consolidation. The no free intraperitoneal air. No dilated small bowel. Calcified fibroid in the pelvis. Bilateral iliac stents. IMPRESSION: 1. Unchanged appearance of the chest with chronic interstitial opacities without acute cardiopulmonary disease. 2. No free intraperitoneal air or radiographic evidence of small-bowel obstruction. Electronically Signed   By: Deatra Robinson M.D.   On: 12/09/2016 02:36   Dg Abdomen Acute W/chest  Result Date: 11/26/2016 CLINICAL DATA:  83 year old female with history of rectal pain from severe constipation. EXAM: DG ABDOMEN ACUTE W/ 1V CHEST COMPARISON:  Chest  x-ray 09/06/2016. Abdominal radiograph 08/31/2016. FINDINGS: Diffuse peribronchial cuffing and mild diffuse interstitial prominence appears to be chronic and is similar to prior studies. Lung volumes are normal. No consolidative airspace disease. No pleural effusions. No pneumothorax. No pulmonary nodule or mass noted. Pulmonary vasculature and the cardiomediastinal silhouette are within normal limits. Gas and stool are seen scattered throughout the colon extending to the level of the distal rectum. No pathologic distension of small bowel is noted. No gross evidence of pneumoperitoneum. Stool burden does not appear excessive. Calcification in central pelvis corresponds to calcified fibroid on prior CT examination. IMPRESSION: 1. Nonobstructive bowel gas pattern. 2. No pneumoperitoneum. 3. Stool burden does not appear excessive despite the patient's reported history of constipation. 4. Diffuse peribronchial cuffing and mild diffuse interstitial prominence appears to be chronic and is similar to prior examinations. 5. Aortic atherosclerosis. Electronically Signed   By: Trudie Reed M.D.   On: 11/26/2016 16:44    Radiology Dg Abd Acute W/chest  Result Date: 12/09/2016 CLINICAL DATA:  Diarrhea EXAM: DG ABDOMEN ACUTE W/ 1V CHEST COMPARISON:  Chest and abdominal radiograph 11/26/2016 FINDINGS: Unchanged cardiomegaly and calcific aortic atherosclerosis. Chronic interstitial opacities are unchanged. No pleural effusion or pneumothorax. No focal consolidation. The no free intraperitoneal air. No dilated small bowel. Calcified fibroid in the pelvis. Bilateral iliac stents. IMPRESSION: 1. Unchanged appearance of the chest with chronic interstitial opacities without acute cardiopulmonary disease. 2. No free intraperitoneal air or radiographic evidence of small-bowel obstruction. Electronically Signed   By: Deatra Robinson M.D.   On: 12/09/2016 02:36    Procedures Procedures (including critical care  time)  Medications Ordered in ED Medications  sodium chloride 0.9 %  bolus 500 mL (0 mLs Intravenous Stopped 12/09/16 0233)  sodium chloride 0.9 % bolus 500 mL (500 mLs Intravenous New Bag/Given 12/09/16 0232)     Final Clinical Impressions(s) / ED Diagnoses   Diarrhea ongoing will admit to medicine    Yolunda Kloos, MD 12/09/16 1610

## 2016-12-10 DIAGNOSIS — N179 Acute kidney failure, unspecified: Secondary | ICD-10-CM | POA: Diagnosis not present

## 2016-12-10 DIAGNOSIS — I5032 Chronic diastolic (congestive) heart failure: Secondary | ICD-10-CM

## 2016-12-10 DIAGNOSIS — R197 Diarrhea, unspecified: Secondary | ICD-10-CM | POA: Diagnosis not present

## 2016-12-10 DIAGNOSIS — E876 Hypokalemia: Secondary | ICD-10-CM

## 2016-12-10 DIAGNOSIS — E119 Type 2 diabetes mellitus without complications: Secondary | ICD-10-CM | POA: Diagnosis not present

## 2016-12-10 DIAGNOSIS — F0150 Vascular dementia without behavioral disturbance: Secondary | ICD-10-CM | POA: Diagnosis not present

## 2016-12-10 DIAGNOSIS — N183 Chronic kidney disease, stage 3 (moderate): Secondary | ICD-10-CM | POA: Diagnosis not present

## 2016-12-10 DIAGNOSIS — Z9114 Patient's other noncompliance with medication regimen: Secondary | ICD-10-CM | POA: Diagnosis not present

## 2016-12-10 DIAGNOSIS — N184 Chronic kidney disease, stage 4 (severe): Secondary | ICD-10-CM | POA: Diagnosis not present

## 2016-12-10 LAB — GASTROINTESTINAL PANEL BY PCR, STOOL (REPLACES STOOL CULTURE)
Adenovirus F40/41: NOT DETECTED
Astrovirus: NOT DETECTED
CRYPTOSPORIDIUM: NOT DETECTED
CYCLOSPORA CAYETANENSIS: NOT DETECTED
Campylobacter species: NOT DETECTED
ENTEROAGGREGATIVE E COLI (EAEC): NOT DETECTED
Entamoeba histolytica: NOT DETECTED
Enteropathogenic E coli (EPEC): NOT DETECTED
Enterotoxigenic E coli (ETEC): NOT DETECTED
GIARDIA LAMBLIA: NOT DETECTED
NOROVIRUS GI/GII: NOT DETECTED
Plesimonas shigelloides: NOT DETECTED
ROTAVIRUS A: NOT DETECTED
SALMONELLA SPECIES: NOT DETECTED
SAPOVIRUS (I, II, IV, AND V): NOT DETECTED
Shiga like toxin producing E coli (STEC): NOT DETECTED
Shigella/Enteroinvasive E coli (EIEC): NOT DETECTED
Vibrio cholerae: NOT DETECTED
Vibrio species: NOT DETECTED
YERSINIA ENTEROCOLITICA: NOT DETECTED

## 2016-12-10 LAB — BASIC METABOLIC PANEL
Anion gap: 9 (ref 5–15)
BUN: 22 mg/dL — AB (ref 6–20)
CHLORIDE: 114 mmol/L — AB (ref 101–111)
CO2: 21 mmol/L — AB (ref 22–32)
CREATININE: 2.01 mg/dL — AB (ref 0.44–1.00)
Calcium: 8.1 mg/dL — ABNORMAL LOW (ref 8.9–10.3)
GFR calc Af Amer: 25 mL/min — ABNORMAL LOW (ref >60)
GFR calc non Af Amer: 21 mL/min — ABNORMAL LOW (ref >60)
Glucose, Bld: 105 mg/dL — ABNORMAL HIGH (ref 65–99)
Potassium: 3.3 mmol/L — ABNORMAL LOW (ref 3.5–5.1)
Sodium: 144 mmol/L (ref 135–145)

## 2016-12-10 LAB — GLUCOSE, CAPILLARY
GLUCOSE-CAPILLARY: 200 mg/dL — AB (ref 65–99)
GLUCOSE-CAPILLARY: 149 mg/dL — AB (ref 65–99)
Glucose-Capillary: 91 mg/dL (ref 65–99)
Glucose-Capillary: 122 mg/dL — ABNORMAL HIGH (ref 65–99)

## 2016-12-10 LAB — MAGNESIUM: Magnesium: 1.7 mg/dL (ref 1.7–2.4)

## 2016-12-10 MED ORDER — POTASSIUM CHLORIDE CRYS ER 20 MEQ PO TBCR
40.0000 meq | EXTENDED_RELEASE_TABLET | Freq: Once | ORAL | Status: AC
  Administered 2016-12-10: 40 meq via ORAL
  Filled 2016-12-10: qty 2

## 2016-12-10 MED ORDER — LOSARTAN POTASSIUM 50 MG PO TABS
50.0000 mg | ORAL_TABLET | Freq: Every day | ORAL | Status: DC
  Administered 2016-12-10 – 2016-12-11 (×2): 50 mg via ORAL
  Filled 2016-12-10 (×2): qty 1

## 2016-12-10 NOTE — Progress Notes (Addendum)
PROGRESS NOTE  [name] MVH:[id number] DOB: 12/25/1927 DOA: 12/08/2016 PCP: Kirt Boys, DO  HPI/Recap of past 24 hours:  Sitting up in chair, report feeling better, has one episode of diarrhea this am, denies ab pain, no fever, no n/v. She is stronger, but not back to baseline yet Daughter at bedside  Assessment/Plan: Principal Problem:   Diarrhea Active Problems:   Coronary artery disease   Chronic diastolic heart failure, NYHA class 1 (HCC)   Hypertension   Diabetes mellitus with nephropathy (HCC)   PAD (peripheral artery disease) (HCC)   Chronic kidney disease, stage III (moderate)   Dementia without behavioral disturbance    1. Severe diarrhea - patient has been having diarrhea in the ER. Has been watery. C. difficile negative. GI pathogen panel pending, s/p Gently hydrate. 2. Hypertension uncontrolled -  Continue Clonidine, Bystolic, restart  Cozaar that was held on admission, prn IV hydralazine. 3. CAD - denies any chest pain. Continue aspirin and Vytorin and Bystolic. 4. AKI on Chronic kidney disease stage 3-4 - cr 2.53 on admission, cr 2.01 today,  Likely from dehydration from large volume diarrhea.  5. noninsulin dependent Diabetes mellitus type 2 - follow CBGs with sliding scale coverage. Patient is on large doses of Januvia for renal failure which will be held. Home med metformin held as well.  6. Chronic diastolic CHF - presently due to renal failure , home lasix held , s/p  gently hydrate. Plan to resume lasix at discharge 7. History of dementia. Not oriented to time at baseline. 8. Hypokalemia: replace k. 9. FTT: will get PT, likely will benefit from home health.     Code Status: DNR  Family Communication: patient and daughter at bedside  Disposition Plan: home with home health on 9/23   Consultants:  none  Procedures:  none  Antibiotics:  none   Objective: BP (!) 184/53 (BP Location: Left Arm)   Pulse 65   Temp 98.1 F (36.7 C)  (Oral)   Resp 16   Ht 5' (1.524 m)   Wt 59.4 kg (130 lb 15.3 oz)   SpO2 98%   BMI 25.58 kg/m   Intake/Output Summary (Last 24 hours) at 12/10/16 1212 Last data filed at 12/10/16 0328  Gross per 24 hour  Intake           183.75 ml  Output              300 ml  Net          -116.25 ml   Filed Weights   12/08/16 2254 12/09/16 1420 12/10/16 0619  Weight: 49.9 kg (110 lb) 63.3 kg (139 lb 8.8 oz) 59.4 kg (130 lb 15.3 oz)    Exam: Patient is examined daily including today on 12/10/2016, exams remain the same as of yesterday except that has changed    General:  Frail, hard of hearing, NAD  Cardiovascular: RRR  Respiratory: CTABL  Abdomen: Soft/ND/NT, positive BS  Musculoskeletal: No Edema  Neuro: alert, oriented   Data Reviewed: Basic Metabolic Panel:  Recent Labs Lab 12/06/16 1410 12/09/16 0056 12/09/16 0452 12/10/16 0425  NA 139 146* 147* 144  K 5.4* 3.3* 3.1* 3.3*  CL 108 113* 116* 114*  CO2 21*  GLUCOSE 123 98 91 105*  BUN 33* 28* 25* 22*  CREATININE 2.88* 2.53* 2.29* 2.01*  CALCIUM 8.5* 8.5* 8.0* 8.1*  MG  --   --   --  1.7   Liver Function Tests:  Recent Labs Lab 12/09/16 0056  AST 20  ALT 10*  ALKPHOS 52  BILITOT 0.4  PROT 6.1*  ALBUMIN 3.4*   No results for input(s): LIPASE, AMYLASE in the last 168 hours. No results for input(s): AMMONIA in the last 168 hours. CBC:  Recent Labs Lab 12/09/16 0056 12/09/16 0452  WBC 4.3 4.6  NEUTROABS 2.4  --   HGB 11.0* 10.2*  HCT 34.4* 31.3*  MCV 93.2 92.9  PLT 218 195   Cardiac Enzymes:   No results for input(s): CKTOTAL, CKMB, CKMBINDEX, TROPONINI in the last 168 hours. BNP (last 3 results) No results for input(s): BNP in the last 8760 hours.  ProBNP (last 3 results) No results for input(s): PROBNP in the last 8760 hours.  CBG:  Recent Labs Lab 12/09/16 1235 12/09/16 1718 12/09/16 2151 12/10/16 0744 12/10/16 1154  GLUCAP 96 135* 131* 91 122*    Recent Results (from the past  240 hour(s))  C difficile quick scan w PCR reflex     Status: None   Collection Time: 12/09/16  3:00 AM  Result Value Ref Range Status   C Diff antigen NEGATIVE NEGATIVE Final   C Diff toxin NEGATIVE NEGATIVE Final   C Diff interpretation No C. difficile detected.  Final     Studies: No results found.  Scheduled Meds: . aspirin EC  81 mg Oral Daily  . cloNIDine  0.1 mg Oral Daily  . ezetimibe  5 mg Oral q1800   And  . simvastatin  10 mg Oral q1800  . feeding supplement (ENSURE ENLIVE)  237 mL Oral BID BM  . heparin  5,000 Units Subcutaneous Q8H  . insulin aspart  0-9 Units Subcutaneous TID WC  . losartan  50 mg Oral Daily  . mouth rinse  15 mL Mouth Rinse BID  . nebivolol  20 mg Oral Daily  . potassium chloride  40 mEq Oral Once  . timolol  1 drop Left Eye Daily    Continuous Infusions:   Time spent: I have personally reviewed and interpreted on  12/10/2016 daily labs, tele strips, imagings as discussed above under date review session and assessment and plans.  I reviewed all nursing notes, vitals, pertinent old records  I have discussed plan of care as described above with RN , patient and family on 12/10/2016   Emrys Mceachron MD, PhD  [facility] Pager [phone]. If 7PM-7AM, please contact night-coverage at [email or web address], password [facility] 12/10/2016, 12:12 PM  LOS: 0 days

## 2016-12-10 NOTE — Evaluation (Signed)
Physical Therapy Evaluation Patient Details Name: [name] MRN: [id number] DOB: 02/25/1928 Today's Date: 12/10/2016   History of Present Illness  83 y.o. female with history of hypertension, chronic kidney disease stage 3-4, CAD status post stenting, diastolic dysfunction, PVD, TIA, diabetes, neuropathy admitted with complaints of severe diarrhea.  Clinical Impression  Pt admitted with above diagnosis. Pt currently with functional limitations due to the deficits listed below (see PT Problem List).  Pt will benefit from skilled PT to increase their independence and safety with mobility to allow discharge to the venue listed below.   Pt assisted with ambulating in hallway and SpO2 80% on room air upon returning to bed (2L O2 [place] reapplied and RN notified).    Follow Up Recommendations Home health PT;Supervision for mobility/OOB    Equipment Recommendations  None recommended by PT    Recommendations for Other Services       Precautions / Restrictions Precautions Precautions: Fall Precaution Comments: monitor sats Restrictions Weight Bearing Restrictions: No      Mobility  Bed Mobility               General bed mobility comments: pt sitting on BSC on arrival and left sitting EOB upon departure  Transfers Overall transfer level: Needs assistance Equipment used: Rolling walker (2 wheeled) Transfers: Sit to/from Stand Sit to Stand: Min guard         General transfer comment: verbal cues for hand placement  Ambulation/Gait Ambulation/Gait assistance: Min guard Ambulation Distance (Feet): 80 Feet (x2) Assistive device: Rolling walker (2 wheeled) Gait Pattern/deviations: Step-through pattern;Decreased stride length     General Gait Details: pt ambulated 80 feet with RW and then wished to attempt without assistive device, appears more unsteady without AD however no physical assist required, SpO2 80% on room air upon returning to room so reapplied 2L O2 and SPO2  improved to 94%  Stairs            Wheelchair Mobility    Modified Rankin (Stroke Patients Only)       Balance Overall balance assessment: Needs assistance         Standing balance support: No upper extremity supported Standing balance-Leahy Scale: Fair                               Pertinent Vitals/Pain Pain Assessment: No/denies pain    Home Living Family/patient expects to be discharged to:: Private residence Living Arrangements: Children (daughter lives with pt) Available Help at Discharge: Available 24 hours/day;Family Type of Home: House Home Access: Stairs to enter Entrance Stairs-Rails: Can reach both Entrance Stairs-Number of Steps: 4 Home Layout: One level Home Equipment: Environmental consultant - 2 wheels;Bedside commode;Wheelchair - manual      Prior Function Level of Independence: Independent               Hand Dominance        Extremity/Trunk Assessment        Lower Extremity Assessment Lower Extremity Assessment: Generalized weakness    Cervical / Trunk Assessment Cervical / Trunk Assessment: Kyphotic  Communication   Communication: HOH (bil hearing aides)  Cognition Arousal/Alertness: Awake/alert Behavior During Therapy: WFL for tasks assessed/performed Overall Cognitive Status: Within Functional Limits for tasks assessed  General Comments      Exercises     Assessment/Plan    PT Assessment Patient needs continued PT services  PT Problem List Decreased strength;Decreased mobility;Decreased balance;Decreased knowledge of use of DME;Cardiopulmonary status limiting activity;Decreased activity tolerance       PT Treatment Interventions DME instruction;Gait training;Therapeutic exercise;Therapeutic activities;Patient/family education;Functional mobility training;Balance training    PT Goals (Current goals can be found in the Care Plan section)  Acute Rehab PT Goals PT  Goal Formulation: With patient Time For Goal Achievement: 12/17/16 Potential to Achieve Goals: Good    Frequency Min 3X/week   Barriers to discharge        Co-evaluation               AM-PAC PT "6 Clicks" Daily Activity  Outcome Measure Difficulty turning over in bed (including adjusting bedclothes, sheets and blankets)?: None Difficulty moving from lying on back to sitting on the side of the bed? : A Little Difficulty sitting down on and standing up from a chair with arms (e.g., wheelchair, bedside commode, etc,.)?: Unable Help needed moving to and from a bed to chair (including a wheelchair)?: A Little Help needed walking in hospital room?: A Little Help needed climbing 3-5 steps with a railing? : A Lot 6 Click Score: 16    End of Session Equipment Utilized During Treatment: Oxygen Activity Tolerance: Patient limited by fatigue Patient left: in bed;with bed alarm set;with call bell/phone within reach Nurse Communication: Mobility status PT Visit Diagnosis: Difficulty in walking, not elsewhere classified (R26.2)    Time: 1610-9604 PT Time Calculation (min) (ACUTE ONLY): 19 min   Charges:   PT Evaluation $PT Eval Low Complexity: 1 Low     PT G Codes:   PT G-Codes **NOT FOR INPATIENT CLASS** Functional Assessment Tool Used: AM-PAC 6 Clicks Basic Mobility;Clinical judgement Functional Limitation: Mobility: Walking and moving around Mobility: Walking and Moving Around Current Status (V4098): At least 20 percent but less than 40 percent impaired, limited or restricted Mobility: Walking and Moving Around Goal Status [phone]): At least 1 percent but less than 20 percent impaired, limited or restricted    Zenovia Jarred, PT, DPT 12/10/2016  Pager: [phone] Maida Sale E 12/10/2016, 12:21 PM

## 2016-12-10 NOTE — Progress Notes (Signed)
PT Cancellation Note  Patient Details Name: [name] MRN: [id number] DOB: 27-Jul-1927   Cancelled Treatment:    Reason Eval/Treat Not Completed: Other (comment) (pt sitting on BSC, also eating, nursing in room, will check back as schedule permits.)   Temika Sutphin,KATHrine E 12/10/2016, 10:15 AM Zenovia Jarred, PT, DPT 12/10/2016 Pager: [phone]

## 2016-12-11 DIAGNOSIS — R197 Diarrhea, unspecified: Secondary | ICD-10-CM | POA: Diagnosis not present

## 2016-12-11 DIAGNOSIS — F0150 Vascular dementia without behavioral disturbance: Secondary | ICD-10-CM

## 2016-12-11 DIAGNOSIS — E119 Type 2 diabetes mellitus without complications: Secondary | ICD-10-CM | POA: Diagnosis not present

## 2016-12-11 DIAGNOSIS — Z9114 Patient's other noncompliance with medication regimen: Secondary | ICD-10-CM

## 2016-12-11 DIAGNOSIS — N184 Chronic kidney disease, stage 4 (severe): Secondary | ICD-10-CM

## 2016-12-11 LAB — BASIC METABOLIC PANEL
ANION GAP: 7 (ref 5–15)
BUN: 25 mg/dL — ABNORMAL HIGH (ref 6–20)
CALCIUM: 8.1 mg/dL — AB (ref 8.9–10.3)
CO2: 19 mmol/L — ABNORMAL LOW (ref 22–32)
Chloride: 116 mmol/L — ABNORMAL HIGH (ref 101–111)
Creatinine, Ser: 1.82 mg/dL — ABNORMAL HIGH (ref 0.44–1.00)
GFR, EST AFRICAN AMERICAN: 28 mL/min — AB (ref >60)
GFR, EST NON AFRICAN AMERICAN: 24 mL/min — AB (ref >60)
Glucose, Bld: 146 mg/dL — ABNORMAL HIGH (ref 65–99)
POTASSIUM: 4.4 mmol/L (ref 3.5–5.1)
SODIUM: 142 mmol/L (ref 135–145)

## 2016-12-11 LAB — GLUCOSE, CAPILLARY
GLUCOSE-CAPILLARY: 118 mg/dL — AB (ref 65–99)
GLUCOSE-CAPILLARY: 179 mg/dL — AB (ref 65–99)

## 2016-12-11 MED ORDER — SITAGLIPTIN PHOSPHATE 25 MG PO TABS: 25.0000 mg | ORAL_TABLET | Freq: Every day | ORAL | 0 refills | Status: DC

## 2016-12-11 MED ORDER — ASPIRIN EC 81 MG PO TBEC: 81.0000 mg | DELAYED_RELEASE_TABLET | Freq: Every day | ORAL | 0 refills | Status: DC

## 2016-12-11 MED ORDER — ENSURE ENLIVE PO LIQD: 237.0000 mL | Freq: Two times a day (BID) | ORAL | 12 refills | Status: DC

## 2016-12-11 NOTE — Discharge Summary (Addendum)
Discharge Summary  [name]:[id number] DOB: Nov 04, 1927  PCP: Kirt Boys, DO  Admit date: 12/08/2016 Discharge date: 12/11/2016  Time spent: <33mins  Recommendations for Outpatient Follow-up:  1. F/u with PMD within a week  for hospital discharge follow up, repeat cbc/bmp at follow up  Discharge Diagnoses:  Active Hospital Problems   Diagnosis Date Noted  . Diarrhea 12/09/2016  . Dementia without behavioral disturbance 06/10/2015  . Chronic kidney disease, stage III (moderate) 02/10/2014  . PAD (peripheral artery disease) (HCC) 01/17/2014  . Diabetes mellitus with nephropathy (HCC) 07/05/2012  . Hypertension   . Coronary artery disease   . Chronic diastolic heart failure, NYHA class 1 (HCC)     Resolved Hospital Problems   Diagnosis Date Noted Date Resolved  No resolved problems to display.    Discharge Condition: stable  Diet recommendation: heart healthy/carb modified  Filed Weights   12/09/16 1420 12/10/16 0619 12/11/16 0500  Weight: 63.3 kg (139 lb 8.8 oz) 59.4 kg (130 lb 15.3 oz) 59.3 kg (130 lb 11.2 oz)    History of present illness: (per admitting MD Dr Toniann Fail) PCP: Kirt Boys, DO  Patient coming from: Home.  Chief Complaint: Diarrhea.  HPI: [name] is a 83 y.o. female with history of hypertension, chronic kidney disease stage 3-4, CAD status post stenting, diastolic dysfunction presents to the ER with complaints of severe diarrhea. Patient was recently placed on antibiotics for UTI. Urine cultures done in the first week of September shows Klebsiella pneumonia. Patient states over the last couple of weeks patient has been exceedingly diarrhea and last 24 hours had worsening. Patient was given Kayexalate for unknown reason and has been taking it yesterday which worsened the patient's diarrhea. Denies abdominal pain nausea vomiting. Has been having poor appetite.   ED Course: In the ER acute abdominal series was unremarkable. Patient  has been admitted for further observation.  Hospital Course:  Principal Problem:   Diarrhea Active Problems:   Coronary artery disease   Chronic diastolic heart failure, NYHA class 1 (HCC)   Hypertension   Diabetes mellitus with nephropathy (HCC)   PAD (peripheral artery disease) (HCC)   Chronic kidney disease, stage III (moderate)   Dementia without behavioral disturbance   1. Severe diarrhea - patient has been having diarrhea in the ER. Has been watery. C. difficile negative. GI pathogen panel negative, s/p Gently hydrate. Diarrhea resolved. 2. Hypertension uncontrolled -  Continue Clonidine, Bystolic, restart  Cozaar that was held on admission. Resumed lasix at discharge. F/u with pmd. 3. CAD - denies any chest pain. Continue aspirin and Vytorin and Bystolic. 4. AKI on Chronic kidney disease stage 3-4 - cr 2.53 on admission, cr 1.82 at discharge, aki Likely from dehydration from large volume diarrhea.  D/c home meds meloxicam  And metformin. Decrease januvia. 5. noninsulin dependent Diabetes mellitus type 2 - recent a1c around 7. Home meds Januvia/metformin held in the hospital due to [facility].   metformin discontinued, januvia dose decreased to  daily at discharge Due to gfr less than 25.  follow up with pmd.   6. Chronic diastolic CHF - presently due to renal failure , home lasix held , s/p  gently hydrate. resume lasix at discharge 7. History of dementia. Not oriented to time at baseline. 8. Hypokalemia: k 3.1 on admission, likely due to diarrhea, k replaced,  k 4.4 at discharge. 9. FTT: PT,  home health.     Code Status: DNR  Family Communication: patient and daughter at  bedside on 9/22  Disposition Plan: home with home health on 9/23   Consultants:  none  Procedures:  none  Antibiotics:  none    Discharge Exam: BP (!) 176/70 (BP Location: Right Arm)   Pulse 66   Temp 98.7 F (37.1 C) (Oral)   Resp 18   Ht 5' (1.524 m)   Wt 59.3 kg (130 lb 11.2  oz)   SpO2 98%   BMI 25.53 kg/m    General:  Frail, hard of hearing, NAD  Cardiovascular: RRR  Respiratory: CTABL  Abdomen: Soft/ND/NT, positive BS  Musculoskeletal: No Edema  Neuro: alert, oriented to person and place, not to time (per daughter this is at baseline)   Discharge Instructions You were cared for by a hospitalist during your hospital stay. If you have any questions about your discharge medications or the care you received while you were in the hospital after you are discharged, you can call the unit and asked to speak with the hospitalist on call if the hospitalist that took care of you is not available. Once you are discharged, your primary care physician will handle any further medical issues. Please note that NO REFILLS for any discharge medications will be authorized once you are discharged, as it is imperative that you return to your primary care physician (or establish a relationship with a primary care physician if you do not have one) for your aftercare needs so that they can reassess your need for medications and monitor your lab values.  Discharge Instructions    Diet - low sodium heart healthy    Complete by:  As directed    Face-to-face encounter (required for Medicare/Medicaid patients)    Complete by:  As directed    I Destyni Hoppel certify that this patient is under my care and that I, or a nurse practitioner or physician's assistant working with me, had a face-to-face encounter that meets the physician face-to-face encounter requirements with this patient on 12/11/2016. The encounter with the patient was in whole, or in part for the following medical condition(s) which is the primary reason for home health care (List medical condition): FTT   The encounter with the patient was in whole, or in part, for the following medical condition, which is the primary reason for home health care:  FTT   I certify that, based on my findings, the following services are medically  necessary home health services:   Nursing Physical therapy     Reason for Medically Necessary Home Health Services:  Skilled Nursing- Change/Decline in Patient Status   My clinical findings support the need for the above services:  Cognitive impairments, dementia, or mental confusion  that make it unsafe to leave home   Further, I certify that my clinical findings support that this patient is homebound due to:  Unable to leave home safely without assistance   Home Health    Complete by:  As directed    To provide the following care/treatments:   PT RN Home Health Aide Social work     Increase activity slowly    Complete by:  As directed      Allergies as of 12/11/2016      Reactions   Namzaric [memantine Hcl-donepezil Hcl] Nausea Only   confusion   Percocet [oxycodone-acetaminophen] Other (See Comments)   loosy goosy   Sulfa Antibiotics Other (See Comments)   Tongue swells   Adhesive [tape] Other (See Comments)   Tears skin.  Please use "paper" tape  Metformin And Related Diarrhea   Abnormal Kidney Functions    Tradjenta [linagliptin]    Low back ache, resolved once medication stopped    Penicillins Hives   Tolerates Ceftriaxone, Has patient had a PCN reaction causing immediate rash, facial/tongue/throat swelling, SOB or lightheadedness with hypotension: Unknown Has patient had a PCN reaction causing severe rash involving mucus membranes or skin necrosis: No Has patient had a PCN reaction that required hospitalization No Has patient had a PCN reaction occurring within the last 10 years: No If all of the above answers are "NO", then may proceed with Cephalosporin use.      Medication List    STOP taking these medications   ciprofloxacin 500 MG tablet Commonly known as:  CIPRO   meloxicam 15 MG tablet Commonly known as:  MOBIC   metFORMIN 500 MG tablet Commonly known as:  GLUCOPHAGE   metroNIDAZOLE 500 MG tablet Commonly known as:  FLAGYL   sodium polystyrene 15  GM/60ML suspension Commonly known as:  KAYEXALATE     TAKE these medications   aspirin EC 81 MG tablet Take 1 tablet (81 mg total) by mouth daily.   cloNIDine 0.1 MG tablet Commonly known as:  CATAPRES Take 1 tablet (0.1 mg total) by mouth daily. For high blood pressure   docusate sodium 100 MG capsule Commonly known as:  COLACE Take 1 capsule (100 mg total) by mouth 2 (two) times daily as needed for mild constipation.   ezetimibe-simvastatin 10-20 MG tablet Commonly known as:  VYTORIN Take 1/2 tablet by mouth once nightly to control cholesterol What changed:  how much to take  how to take this  when to take this  additional instructions   feeding supplement (ENSURE ENLIVE) Liqd Take 237 mLs by mouth 2 (two) times daily between meals.   furosemide 20 MG tablet Commonly known as:  LASIX Take 1 tablet (20 mg total) by mouth 2 (two) times daily. For Hypertension What changed:  when to take this  additional instructions   losartan 50 MG tablet Commonly known as:  COZAAR Take 1 tablet (50 mg total) by mouth daily. for high blood pressure   Loteprednol Etabonate 0.5 % Gel Commonly known as:  LOTEMAX PLACE 1 DROP IN THE LEFT EYE TWICE DAILY What changed:  how much to take  how to take this  when to take this  additional instructions   Nebivolol HCl 20 MG Tabs Commonly known as:  BYSTOLIC Take 1 tablet (20 mg total) by mouth daily. For Blood Pressure   nitroGLYCERIN 0.4 MG SL tablet Commonly known as:  NITROSTAT Place one under tongue for chest pain, up to 3 tablets What changed:  how much to take  how to take this  when to take this  reasons to take this  additional instructions   sitaGLIPtin 25 MG tablet Commonly known as:  JANUVIA Take 1 tablet (25 mg total) by mouth daily. What changed:  medication strength  how much to take  additional instructions   timolol 0.5 % ophthalmic solution Commonly known as:  BETIMOL INSTILL 1 DROP INTO  THE LEFT EYE DAILY            Discharge Care Instructions        Start     Ordered   12/11/16 0000  Home Health  (Home health needs / face to face )    Question Answer Comment  To provide the following care/treatments PT   To provide the following care/treatments RN  To provide the following care/treatments Home Health Aide   To provide the following care/treatments Social work      12/11/16 1026   12/11/16 0000  Face-to-face encounter (required for Medicare/Medicaid patients)  (Home health needs / face to face )    Comments:  I Chaseton Yepiz certify that this patient is under my care and that I, or a nurse practitioner or physician's assistant working with me, had a face-to-face encounter that meets the physician face-to-face encounter requirements with this patient on 12/11/2016. The encounter with the patient was in whole, or in part for the following medical condition(s) which is the primary reason for home health care (List medical condition): FTT  Question Answer Comment  The encounter with the patient was in whole, or in part, for the following medical condition, which is the primary reason for home health care FTT   I certify that, based on my findings, the following services are medically necessary home health services Nursing   I certify that, based on my findings, the following services are medically necessary home health services Physical therapy   Reason for Medically Necessary Home Health Services Skilled Nursing- Change/Decline in Patient Status   My clinical findings support the need for the above services Cognitive impairments, dementia, or mental confusion  that make it unsafe to leave home   Further, I certify that my clinical findings support that this patient is homebound due to: Unable to leave home safely without assistance      12/11/16 1026   12/11/16 0000  feeding supplement, ENSURE ENLIVE, (ENSURE ENLIVE) LIQD  2 times daily between meals     12/11/16 1120    12/11/16 0000  Increase activity slowly     12/11/16 1120   12/11/16 0000  Diet - low sodium heart healthy     12/11/16 1120   12/11/16 0000  aspirin EC 81 MG tablet  Daily     12/11/16 1122   12/11/16 0000  sitaGLIPtin (JANUVIA) 25 MG tablet  Daily     12/11/16 1123     Allergies  Allergen Reactions  . Namzaric [Memantine Hcl-Donepezil Hcl] Nausea Only    confusion  . Percocet [Oxycodone-Acetaminophen] Other (See Comments)    loosy goosy  . Sulfa Antibiotics Other (See Comments)    Tongue swells  . Adhesive [Tape] Other (See Comments)    Tears skin.  Please use "paper" tape  . Metformin And Related Diarrhea    Abnormal Kidney Functions   . Tradjenta [Linagliptin]     Low back ache, resolved once medication stopped   . Penicillins Hives    Tolerates Ceftriaxone, Has patient had a PCN reaction causing immediate rash, facial/tongue/throat swelling, SOB or lightheadedness with hypotension: Unknown Has patient had a PCN reaction causing severe rash involving mucus membranes or skin necrosis: No Has patient had a PCN reaction that required hospitalization No Has patient had a PCN reaction occurring within the last 10 years: No If all of the above answers are "NO", then may proceed with Cephalosporin use.    Follow-up Information    Caitrin, Pendergraph, DO Follow up in 1 week(s).   Specialty:  Internal Medicine Why:  hospital discharge follow up, repeat cbc/bmp at follow up Contact information: [address] [place] [address] [phone]            The results of significant diagnostics from this hospitalization (including imaging, microbiology, ancillary and laboratory) are listed below for reference.    Significant Diagnostic Studies: Ct  Abdomen Pelvis Wo Contrast  Result Date: 11/26/2016 CLINICAL DATA:  Rectal pain and constipation EXAM: CT ABDOMEN AND PELVIS WITHOUT CONTRAST TECHNIQUE: Multidetector CT imaging of the abdomen and pelvis was performed following the  standard protocol without IV contrast. COMPARISON:  11/26/2016, CT 08/17/2016 FINDINGS: Lower chest: No consolidation or effusion. Scattered ground-glass densities in the lower lobes. Mild lower lobe bronchial thickening and interstitial densities. Mild cardiomegaly. Coronary artery calcification. Hepatobiliary: No focal liver abnormality is seen. No gallstones, gallbladder wall thickening, or biliary dilatation. Pancreas: Unremarkable. No pancreatic ductal dilatation or surrounding inflammatory changes. Spleen: Normal in size without focal abnormality. Adrenals/Urinary Tract: No hydronephrosis. 3 mm stone at the left renal hilus could be vascular or a small stone in the pelvis. Normal bladder Stomach/Bowel: Stomach is nonenlarged. No dilated small bowel. Possible mild thickening of the rectum. Mild to moderate stool. Nonvisualized appendix Vascular/Lymphatic: Extensive vascular calcifications of the aorta and branch vessels. No aneurysmal dilatation. Bi-iliac stents. No significantly enlarged lymph nodes Reproductive: Calcified uterine fibroid. Stable 3 cm cyst left at adnexa Other: Negative for free air or free fluid. Small fat in the umbilicus. Musculoskeletal: Marked degenerative changes of the spine. No acute or suspicious lesion IMPRESSION: 1. Negative for a bowel obstruction. Stool burden is mild to moderate. Possible mild wall thickening of the rectum, could indicate mild proctitis. 2. Scattered ground-glass densities in the lower lobes which may reflect atelectasis. Increased lower lobe bronchial thickening and interstitial densities suggesting bronchitis or possible viral illness 3. 3 mm stone versus intravascular calcification at the left renal hilus. 4. Calcified uterine fibroid.  Stable 3 cm cyst at the left adnexa Electronically Signed   By: Jasmine Pang M.D.   On: 11/26/2016 19:30   Dg Abd Acute W/chest  Result Date: 12/09/2016 CLINICAL DATA:  Diarrhea EXAM: DG ABDOMEN ACUTE W/ 1V CHEST  COMPARISON:  Chest and abdominal radiograph 11/26/2016 FINDINGS: Unchanged cardiomegaly and calcific aortic atherosclerosis. Chronic interstitial opacities are unchanged. No pleural effusion or pneumothorax. No focal consolidation. The no free intraperitoneal air. No dilated small bowel. Calcified fibroid in the pelvis. Bilateral iliac stents. IMPRESSION: 1. Unchanged appearance of the chest with chronic interstitial opacities without acute cardiopulmonary disease. 2. No free intraperitoneal air or radiographic evidence of small-bowel obstruction. Electronically Signed   By: Deatra Robinson M.D.   On: 12/09/2016 02:36   Dg Abdomen Acute W/chest  Result Date: 11/26/2016 CLINICAL DATA:  83 year old female with history of rectal pain from severe constipation. EXAM: DG ABDOMEN ACUTE W/ 1V CHEST COMPARISON:  Chest x-ray 09/06/2016. Abdominal radiograph 08/31/2016. FINDINGS: Diffuse peribronchial cuffing and mild diffuse interstitial prominence appears to be chronic and is similar to prior studies. Lung volumes are normal. No consolidative airspace disease. No pleural effusions. No pneumothorax. No pulmonary nodule or mass noted. Pulmonary vasculature and the cardiomediastinal silhouette are within normal limits. Gas and stool are seen scattered throughout the colon extending to the level of the distal rectum. No pathologic distension of small bowel is noted. No gross evidence of pneumoperitoneum. Stool burden does not appear excessive. Calcification in central pelvis corresponds to calcified fibroid on prior CT examination. IMPRESSION: 1. Nonobstructive bowel gas pattern. 2. No pneumoperitoneum. 3. Stool burden does not appear excessive despite the patient's reported history of constipation. 4. Diffuse peribronchial cuffing and mild diffuse interstitial prominence appears to be chronic and is similar to prior examinations. 5. Aortic atherosclerosis. Electronically Signed   By: Trudie Reed M.D.   On: 11/26/2016  16:44    Microbiology: Recent Results (  from the past 240 hour(s))  C difficile quick scan w PCR reflex     Status: None   Collection Time: 12/09/16  3:00 AM  Result Value Ref Range Status   C Diff antigen NEGATIVE NEGATIVE Final   C Diff toxin NEGATIVE NEGATIVE Final   C Diff interpretation No C. difficile detected.  Final  Gastrointestinal Panel by PCR , Stool     Status: None   Collection Time: 12/09/16  3:00 AM  Result Value Ref Range Status   Campylobacter species NOT DETECTED NOT DETECTED Final   Plesimonas shigelloides NOT DETECTED NOT DETECTED Final   Salmonella species NOT DETECTED NOT DETECTED Final   Yersinia enterocolitica NOT DETECTED NOT DETECTED Final   Vibrio species NOT DETECTED NOT DETECTED Final   Vibrio cholerae NOT DETECTED NOT DETECTED Final   Enteroaggregative E coli (EAEC) NOT DETECTED NOT DETECTED Final   Enteropathogenic E coli (EPEC) NOT DETECTED NOT DETECTED Final   Enterotoxigenic E coli (ETEC) NOT DETECTED NOT DETECTED Final   Shiga like toxin producing E coli (STEC) NOT DETECTED NOT DETECTED Final   Shigella/Enteroinvasive E coli (EIEC) NOT DETECTED NOT DETECTED Final   Cryptosporidium NOT DETECTED NOT DETECTED Final   Cyclospora cayetanensis NOT DETECTED NOT DETECTED Final   Entamoeba histolytica NOT DETECTED NOT DETECTED Final   Giardia lamblia NOT DETECTED NOT DETECTED Final   Adenovirus F40/41 NOT DETECTED NOT DETECTED Final   Astrovirus NOT DETECTED NOT DETECTED Final   Norovirus GI/GII NOT DETECTED NOT DETECTED Final   Rotavirus A NOT DETECTED NOT DETECTED Final   Sapovirus (I, II, IV, and V) NOT DETECTED NOT DETECTED Final     Labs: Basic Metabolic Panel:  Recent Labs Lab 12/06/16 1410 12/09/16 0056 12/09/16 0452 12/10/16 0425 12/11/16 0433  NA 139 146* 147* 144 142  K 5.4* 3.3* 3.1* 3.3* 4.4  CL 108 113* 116* 114* 116*  CO2 22 23 22  21* 19*  GLUCOSE 123 98 91 105* 146*  BUN 33* 28* 25* 22* 25*  CREATININE 2.88* 2.53* 2.29*  2.01* 1.82*  CALCIUM 8.5* 8.5* 8.0* 8.1* 8.1*  MG  --   --   --  1.7  --    Liver Function Tests:  Recent Labs Lab 12/09/16 0056  AST 20  ALT 10*  ALKPHOS 52  BILITOT 0.4  PROT 6.1*  ALBUMIN 3.4*   No results for input(s): LIPASE, AMYLASE in the last 168 hours. No results for input(s): AMMONIA in the last 168 hours. CBC:  Recent Labs Lab 12/09/16 0056 12/09/16 0452  WBC 4.3 4.6  NEUTROABS 2.4  --   HGB 11.0* 10.2*  HCT 34.4* 31.3*  MCV 93.2 92.9  PLT 218 195   Cardiac Enzymes: No results for input(s): CKTOTAL, CKMB, CKMBINDEX, TROPONINI in the last 168 hours. BNP: BNP (last 3 results) No results for input(s): BNP in the last 8760 hours.  ProBNP (last 3 results) No results for input(s): PROBNP in the last 8760 hours.  CBG:  Recent Labs Lab 12/10/16 0744 12/10/16 1154 12/10/16 1641 12/10/16 2056 12/11/16 0800  GLUCAP 91 122* 200* 149* 118*       Signed:  Devonda Pequignot MD, PhD  [facility] 12/11/2016, 11:33 AM

## 2016-12-11 NOTE — Care Management Obs Status (Signed)
MEDICARE OBSERVATION STATUS NOTIFICATION   Patient Details  Name: [name] MRN: [id number] Date of Birth: [birth year]   Medicare Observation Status Notification Given:  Yes    Elliot Cousin, RN 12/11/2016, 12:49 PM

## 2016-12-11 NOTE — Care Management Note (Addendum)
Case Management Note  Patient Details  Name: Noreen E Gondek MRN: [id number] MARAVILLAe of Birth: 1928-03-20  Subjective/Objective:  CKD, diarrhea                  Action/Plan: Discharge Planning: NCM spoke to pt and requested NCM contact dtr, [name]. Spoke to pt's dtr [name]. States pt is active with HH. Dtr believes it was [facility]. Contacted [facility] to follow up. Dtr states pt has RW and bedside commode at home.   PCP Kirt Boys MD    Expected Discharge Date:  12/11/16               Expected Discharge Plan:  Home w Home Health Services  In-House Referral:  NA  Discharge planning Services  CM Consult  Post Acute Care Choice:  Home Health, Resumption of Svcs/PTA Provider Choice offered to:  Patient  DME Arranged:  N/A DME Agency:  NA  HH Arranged:  PT, RN, Nurse's Aide, Social Work [employer] Agency:  [facility]  Status of Service:  Completed, signed off  If discussed at [employer] [facility] [employer], dates discussed:    Additional Comments:  Elliot Cousin, RN 12/11/2016, 12:34 PM

## 2016-12-12 ENCOUNTER — Telehealth: Payer: Self-pay

## 2016-12-12 ENCOUNTER — Other Ambulatory Visit: Payer: Self-pay

## 2016-12-12 NOTE — Telephone Encounter (Signed)
Transition Care Management Follow-up Telephone Call  Spoke with patients daughter  Date discharged? 12/11/2016  How have you been since you were released from the hospital? Good, no diarrhea since d/c  Do you understand why you were in the hospital? Yes  Do you understand the discharge instructions? yes  Where were you discharged to? Home   Items Reviewed: Medications reviewed: YES Allergies reviewed: YES Dietary changes reviewed: YES Referrals reviewed: YES   Functional Questionnaire:  Activities of Daily Living (ADLs):    states they are independent in the following: dressing,bathing, feeding, transferring States they require assistance with the following: none  Any transportation issues/concerns?: no  Any patient concerns? no  Confirmed importance and date/time of follow-up visits scheduled : yes Provider Appointment booked with Abbey Chatters on 12/21/2016 at 2:15pm at [facility] (no availability with MD)  Confirmed with patient if condition begins to worsen call PCP or go to the ER.  Patient was given the office number and encouraged to call back with question or concerns.  : YES

## 2016-12-13 ENCOUNTER — Other Ambulatory Visit: Payer: Self-pay | Admitting: *Deleted

## 2016-12-15 ENCOUNTER — Other Ambulatory Visit: Payer: Self-pay | Admitting: *Deleted

## 2016-12-21 ENCOUNTER — Ambulatory Visit (INDEPENDENT_AMBULATORY_CARE_PROVIDER_SITE_OTHER): Payer: Medicare Other | Admitting: Nurse Practitioner

## 2016-12-21 ENCOUNTER — Other Ambulatory Visit: Payer: Self-pay | Admitting: *Deleted

## 2016-12-21 ENCOUNTER — Encounter: Payer: Self-pay | Admitting: Nurse Practitioner

## 2016-12-21 ENCOUNTER — Ambulatory Visit: Payer: Self-pay | Admitting: *Deleted

## 2016-12-21 VITALS — BP 124/82 | HR 63 | Temp 97.7°F | Resp 17 | Ht 60.0 in | Wt 126.8 lb

## 2016-12-21 DIAGNOSIS — E1121 Type 2 diabetes mellitus with diabetic nephropathy: Secondary | ICD-10-CM

## 2016-12-21 DIAGNOSIS — I5032 Chronic diastolic (congestive) heart failure: Secondary | ICD-10-CM | POA: Diagnosis not present

## 2016-12-21 DIAGNOSIS — R197 Diarrhea, unspecified: Secondary | ICD-10-CM

## 2016-12-21 DIAGNOSIS — F0390 Unspecified dementia without behavioral disturbance: Secondary | ICD-10-CM

## 2016-12-21 DIAGNOSIS — N184 Chronic kidney disease, stage 4 (severe): Secondary | ICD-10-CM

## 2016-12-21 DIAGNOSIS — I10 Essential (primary) hypertension: Secondary | ICD-10-CM | POA: Diagnosis not present

## 2016-12-21 NOTE — Progress Notes (Signed)
Careteam: Patient Care Team: Gildardo Cranker, DO as PCP - General (Internal Medicine) Burtis Junes, NP as Nurse Practitioner (Cardiology) Hayden Pedro, MD as Consulting Physician (Ophthalmology) Gayland Curry, DO as Consulting Physician (Geriatric Medicine) Elam Dutch, MD as Consulting Physician (Vascular Surgery) Ronald Lobo, MD as Consulting Physician (Gastroenterology) Sherren Mocha, MD as Consulting Physician (Cardiology) Michael Boston, MD as Consulting Physician (General Surgery) Deirdre Peer, LCSW as [place] Management  Advanced Directive information Does Patient Have a Medical Advance Directive?: No  Allergies  Allergen Reactions  . Namzaric [Memantine Hcl-Donepezil Hcl] Nausea Only    confusion  . Percocet [Oxycodone-Acetaminophen] Other (See Comments)    loosy goosy  . Sulfa Antibiotics Other (See Comments)    Tongue swells  . Adhesive [Tape] Other (See Comments)    Tears skin.  Please use "paper" tape  . Metformin And Related Diarrhea    Abnormal Kidney Functions   . Tradjenta [Linagliptin]     Low back ache, resolved once medication stopped   . Penicillins Hives    Tolerates Ceftriaxone, Has patient had a PCN reaction causing immediate rash, facial/tongue/throat swelling, SOB or lightheadedness with hypotension: Unknown Has patient had a PCN reaction causing severe rash involving mucus membranes or skin necrosis: No Has patient had a PCN reaction that required hospitalization No Has patient had a PCN reaction occurring within the last 10 years: No If all of the above answers are "NO", then may proceed with Cephalosporin use.     Chief Complaint  Patient presents with  . Transitions Of Care    Pt was admitted to [facility] 12/08/16 to 12/11/16 due to severe diarrhea.      HPI: Patient is a 83 y.o. female seen in the office today to follow up hospitalization.  Pt with history of hypertension, chronic kidney disease  stage 3-4, CAD status post stenting, diastolic dysfunction, dementia.  She went to the hospital due to ongoing diarrhea.  She was given kayexalate due to hyperkalemia which worsened diarrhea.  Diarrhea work up was negative (c diff and GI pathogen panel).  Pt with uncontrolled hypertension- she was continued on clonidine, bystolic and cozaar.  Pt with hx of diabetes, due to AKI metformin was stopped and she was placed on jauvia 25 mg daily due to GFR less than 25.  Electrolytes replaced during hospitalization.  She was discharged home with home health but reports she does not think they have been out. Pt with dementia and is a poor historian, does not wish for daughters to come back with her.  pts daughter called today- reports she has thrown out home health states she would not allow her caregiver to be present and home health not allowed to be with her with a dementia dx and no caregiver present.  Between daughters there is someone with her most of the time.  Daughter plans to check medications.  Review of Systems:  Review of Systems  Unable to perform ROS: Dementia    Past Medical History:  Diagnosis Date  . Anemia   . Anxiety disorder   . Carotid artery occlusion   . Carotid bruit   . Chronic kidney disease (CKD), stage III (moderate) (HCC)   . Coronary artery disease    Remote PCI. Had BMS to RCA and DES stent to Diagonal in Jan. 2012   . Diastolic heart failure    EF 55 to 60% per cath in Jan 2012  . DVT (deep venous thrombosis) (  HCC)   . Gout   . Hyperlipidemia   . Hypertension   . Ischemic cardiomyopathy    with prior EF of 35%  . NSTEMI (non-ST elevated myocardial infarction) [facility]) Jan 2012   with PCI to RCA and DX per Dr. Burt Knack  . Obesity   . Pneumonia 2016  . Proteinuria   . Stroke [facility]) 2013   ?  mini    . TIA (transient ischemic attack)   . Type II diabetes mellitus ([place])    long standing   Past Surgical History:  Procedure Laterality Date  . ABDOMINAL  AORTAGRAM N/A 01/17/2014   Procedure: ABDOMINAL Maxcine Ham;  Surgeon: Elam Dutch, MD;  Location: [facility] LAB;  Service: Cardiovascular;  Laterality: N/A;  . ANKLE FUSION Bilateral   . APPENDECTOMY    . CATARACT EXTRACTION, BILATERAL Bilateral   . CORONARY ANGIOPLASTY WITH STENT PLACEMENT  04/05/2010   distal RCA  . FRACTURE SURGERY Right 2005   bilateral ankles   . TONSILLECTOMY     Social History:   reports that she has quit smoking. She has never used smokeless tobacco. She reports that she does not drink alcohol or use drugs.  History reviewed. No pertinent family history.  Medications: Patient's Medications  New Prescriptions   No medications on file  Previous Medications   ASPIRIN EC 81 MG TABLET    Take 1 tablet (81 mg total) by mouth daily.   CLONIDINE (CATAPRES) 0.1 MG TABLET    Take 1 tablet (0.1 mg total) by mouth daily. For high blood pressure   DOCUSATE SODIUM (COLACE) 100 MG CAPSULE    Take 1 capsule (100 mg total) by mouth 2 (two) times daily as needed for mild constipation.   EZETIMIBE-SIMVASTATIN (VYTORIN) 10-20 MG TABLET    Take 1/2 tablet by mouth once nightly to control cholesterol   FEEDING SUPPLEMENT, ENSURE ENLIVE, (ENSURE ENLIVE) LIQD    Take 237 mLs by mouth 2 (two) times daily between meals.   FUROSEMIDE (LASIX) 20 MG TABLET    Take 1 tablet (20 mg total) by mouth 2 (two) times daily. For Hypertension   LOSARTAN (COZAAR) 50 MG TABLET    Take 1 tablet (50 mg total) by mouth daily. for high blood pressure   LOTEPREDNOL ETABONATE (LOTEMAX) 0.5 % GEL    PLACE 1 DROP IN THE LEFT EYE TWICE DAILY   NEBIVOLOL HCL (BYSTOLIC) 20 MG TABS    Take 1 tablet (20 mg total) by mouth daily. For Blood Pressure   NITROGLYCERIN (NITROSTAT) 0.4 MG SL TABLET    Place one under tongue for chest pain, up to 3 tablets   SITAGLIPTIN (JANUVIA) 25 MG TABLET    Take 1 tablet (25 mg total) by mouth daily.   TIMOLOL (BETIMOL) 0.5 % OPHTHALMIC SOLUTION    INSTILL 1 DROP INTO THE LEFT EYE  DAILY  Modified Medications   No medications on file  Discontinued Medications   No medications on file     Physical Exam:  Vitals:   12/21/16 1428  BP: 124/82  Pulse: 63  Resp: 17  Temp: 97.7 F (36.5 C)  TempSrc: Oral  SpO2: 94%  Weight: 126 lb 12.8 oz (57.5 kg)  Height: 5' (1.524 m)   Body mass index is 24.76 kg/m.  Physical Exam  Constitutional: She appears well-developed and well-nourished. No distress.  HENT:  Head: Normocephalic and atraumatic.  Eyes: Pupils are equal, round, and reactive to light.  Cardiovascular: Normal rate and regular rhythm.  Murmur (1/6 SEM) heard. Pulmonary/Chest: Effort normal and breath sounds normal. No respiratory distress.  Abdominal: Soft. Bowel sounds are normal. She exhibits no distension. There is no tenderness. There is no guarding.  Musculoskeletal: She exhibits edema.  Neurological: She is alert.  Skin: Skin is warm and dry. No rash noted.  Psychiatric: She has a normal mood and affect. She is agitated. Thought content is paranoid.    Labs reviewed: Basic Metabolic Panel:  Recent Labs  09/08/16 1523  12/09/16 0452 12/10/16 0425 12/11/16 0433  NA  --   < > 147* 144 142  K  --   < > 3.1* 3.3* 4.4  CL  --   < > 116* 114* 116*  CO2  --   < > 22 21* 19*  GLUCOSE  --   < > 91 105* 146*  BUN  --   < > 25* 22* 25*  CREATININE  --   < > 2.29* 2.01* 1.82*  CALCIUM  --   < > 8.0* 8.1* 8.1*  MG  --   --   --  1.7  --   TSH 0.78  --   --   --   --   < > = values in this interval not displayed.   Estimated Creatinine Clearance: 17.3 mL/min (A) (by C-G formula based on SCr of 1.82 mg/dL (H)).  Liver Function Tests:  Recent Labs  10/01/16 1523 11/26/16 1725 12/09/16 0056  AST 14* 16 20  ALT 9* 9* 10*  ALKPHOS 49 70 52  BILITOT 0.5 0.6 0.4  PROT 5.7* 6.9 6.1*  ALBUMIN 3.1* 3.6 3.4*    Recent Labs  04/29/16 1407 10/01/16 1523 11/26/16 1725  LIPASE 33 21 18   No results for input(s): AMMONIA in the last 8760  hours. CBC:  Recent Labs  08/26/16 1623  11/26/16 1725 12/09/16 0056 12/09/16 0452  WBC 3.9*  < > 5.0 4.3 4.6  NEUTROABS 2.1  --  3.0 2.4  --   HGB 10.8*  < > 12.0 11.0* 10.2*  HCT 33.4*  < > 37.0 34.4* 31.3*  MCV 93.3  < > 92.5 93.2 92.9  PLT 181  < > 226 218 195  < > = values in this interval not displayed. Lipid Panel:  Recent Labs  04/20/16 1351 08/29/16 1051  CHOL 148 130  HDL 42* 39*  LDLCALC 71 52  TRIG 173* 196*  CHOLHDL 3.5 3.3   TSH:  Recent Labs  09/08/16 1523  TSH 0.78   A1C: Lab Results  Component Value Date   HGBA1C 7.1 (H) 08/29/2016   MMSE - Mini Mental State Exam 09/08/2016 04/20/2016 01/20/2016  Not completed: - (No Data) -  Orientation to time 2 - 3  Orientation to Place 3 - 4  Registration 3 - 3  Attention/ Calculation 0 - 4  Recall 0 - 3  Language- name 2 objects 2 - 2  Language- repeat 1 - 1  Language- follow 3 step command 2 - 3  Language- read & follow direction 1 - 1  Write a sentence 1 - 1  Copy design 0 - 0  Total score 15 - 25     Assessment/Plan 1. Diabetes mellitus with nephropathy (HCC) Medications modified during hospitalization due to worsening renal function. Metformin was stopped and [place] was reduced to 25 mg daily, reviewed these changes with daughter as well, unsure what she is taking at home and does not take blood sugars.  2. Dementia without behavioral disturbance, unspecified dementia type -Spoke with daughter via telephone after visit. Pt will not allow for her children to come in the room during these visit or allow them to be present for Home health visits therefore [place] had to dismiss her. She does allow [name] to help her with her medication. She plans to check MyChart for her medication list after office visit. Pt is not alone but for a few hours 2 days a week. Does not wander. Daughter also planning to take control of more financial aspects as well as pts dementia is progressing. Daughter states  behaviors are an issue at home and she does get agitated easily. She is very paranoid in office.  -appears namzaric was tried but caused increase confusion? May consider trying this again if daughter is monitoring medication and helping with them if she is agreeable.  Paper work filled out for an Risk manager as well to have more care in the home.    3. Diarrhea, unspecified type Resolved per pt.   4. Essential hypertension Controlled, taking catapres, losartan, bystolic and lasix  5. Chronic diastolic heart failure, NYHA class 1 (HCC) Stable,  taking lasix 20 mg BID and losartan  6. Stage 4 chronic kidney disease ([place]) -discussed proper hydration and to avoid NSAIDS - CBC with Differential/Platelets - CMP with eGFR - Ambulatory referral to Nephrology  Next appt: 6 weeks with Dr Eulas Post, sooner if needed based on lab work [employer]. Harle Battiest  [facility] [phone] 8 am - 5 pm) [phone] (after hours)

## 2016-12-21 NOTE — Patient Instructions (Signed)
Referral place to nephrologist   Please check medication list with medication you are taking   Follow up in 6 weeks with Dr Montez Morita (cancel appt for next week)

## 2016-12-22 ENCOUNTER — Other Ambulatory Visit: Payer: Self-pay | Admitting: Nurse Practitioner

## 2016-12-22 ENCOUNTER — Other Ambulatory Visit: Payer: Self-pay | Admitting: *Deleted

## 2016-12-22 LAB — CBC WITH DIFFERENTIAL/PLATELET
BASOS PCT: 0.4 %
Basophils Absolute: 18 cells/uL (ref 0–200)
Eosinophils Absolute: 138 cells/uL (ref 15–500)
Eosinophils Relative: 3 %
HEMATOCRIT: 32.6 % — AB (ref 35.0–45.0)
HEMOGLOBIN: 10.4 g/dL — AB (ref 11.7–15.5)
LYMPHS ABS: 1389 {cells}/uL (ref 850–3900)
MCH: 29.3 pg (ref 27.0–33.0)
MCHC: 31.9 g/dL — ABNORMAL LOW (ref 32.0–36.0)
MCV: 91.8 fL (ref 80.0–100.0)
MPV: 11.2 fL (ref 7.5–12.5)
Monocytes Relative: 7.5 %
Neutro Abs: 2709 cells/uL (ref 1500–7800)
Neutrophils Relative %: 58.9 %
PLATELETS: 206 10*3/uL (ref 140–400)
RBC: 3.55 10*6/uL — AB (ref 3.80–5.10)
RDW: 12.9 % (ref 11.0–15.0)
TOTAL LYMPHOCYTE: 30.2 %
WBC: 4.6 10*3/uL (ref 3.8–10.8)
WBCMIX: 345 {cells}/uL (ref 200–950)

## 2016-12-22 LAB — COMPLETE METABOLIC PANEL WITH GFR
AG RATIO: 1.4 (calc) (ref 1.0–2.5)
ALBUMIN MSPROF: 3.5 g/dL — AB (ref 3.6–5.1)
ALT: 6 U/L (ref 6–29)
AST: 16 U/L (ref 10–35)
Alkaline phosphatase (APISO): 50 U/L (ref 33–130)
BILIRUBIN TOTAL: 0.5 mg/dL (ref 0.2–1.2)
BUN / CREAT RATIO: 12 (calc) (ref 6–22)
BUN: 28 mg/dL — ABNORMAL HIGH (ref 7–25)
CALCIUM: 8.7 mg/dL (ref 8.6–10.4)
CO2: 22 mmol/L (ref 20–32)
Chloride: 112 mmol/L — ABNORMAL HIGH (ref 98–110)
Creat: 2.27 mg/dL — ABNORMAL HIGH (ref 0.60–0.88)
GFR, EST AFRICAN AMERICAN: 22 mL/min/{1.73_m2} — AB (ref >=60)
GFR, EST NON AFRICAN AMERICAN: 19 mL/min/{1.73_m2} — AB (ref >=60)
GLOBULIN: 2.5 g/dL (ref 1.9–3.7)
Glucose, Bld: 111 mg/dL (ref 65–139)
Potassium: 5.1 mmol/L (ref 3.5–5.3)
SODIUM: 143 mmol/L (ref 135–146)
TOTAL PROTEIN: 6 g/dL — AB (ref 6.1–8.1)

## 2016-12-22 MED ORDER — FUROSEMIDE 20 MG PO TABS: 20.0000 mg | ORAL_TABLET | Freq: Every day | ORAL | 3 refills | Status: DC

## 2016-12-22 NOTE — Patient Outreach (Signed)
[place] [facility]) Care Management  [facility] Social Work  12/22/2016  [name] 1927/08/10 [id number]  Subjective:  "She had a medicine changed and has not gotten it yet".  Objective: CSW to assist patient with commuity based resources to aide in her well-being, quality of life and overall safety/needs.    Current Medications:  Current Outpatient Prescriptions  Medication Sig Dispense Refill  . sitaGLIPtin (JANUVIA) 25 MG tablet Take 1 tablet (25 mg total) by mouth daily. 30 tablet 0  . aspirin EC 81 MG tablet Take 1 tablet (81 mg total) by mouth daily. 30 tablet 0  . cloNIDine (CATAPRES) 0.1 MG tablet Take 1 tablet (0.1 mg total) by mouth daily. For high blood pressure 30 tablet 3  . docusate sodium (COLACE) 100 MG capsule Take 1 capsule (100 mg total) by mouth 2 (two) times daily as needed for mild constipation. 60 capsule 3  . ezetimibe-simvastatin (VYTORIN) 10-20 MG tablet Take 1/2 tablet by mouth once nightly to control cholesterol 15 tablet 3  . feeding supplement, ENSURE ENLIVE, (ENSURE ENLIVE) LIQD Take 237 mLs by mouth 2 (two) times daily between meals. 237 mL 12  . furosemide (LASIX) 20 MG tablet Take 1 tablet (20 mg total) by mouth daily. For Hypertension 60 tablet 3  . Loteprednol Etabonate (LOTEMAX) 0.5 % GEL PLACE 1 DROP IN THE LEFT EYE TWICE DAILY 5 g 1  . Nebivolol HCl (BYSTOLIC) 20 MG TABS Take 1 tablet (20 mg total) by mouth daily. For Blood Pressure 30 tablet 3  . nitroGLYCERIN (NITROSTAT) 0.4 MG SL tablet Place one under tongue for chest pain, up to 3 tablets 30 tablet 3  . timolol (BETIMOL) 0.5 % ophthalmic solution INSTILL 1 DROP INTO THE LEFT EYE DAILY 10 mL 1   No current facility-administered medications for this visit.     Functional Status:  In your present state of health, do you have any difficulty performing the following activities: 12/09/2016 11/17/2016  Hearing? [place]? N -  Difficulty concentrating or making decisions?  Y -  Comment - -  Walking or climbing stairs? Y -  Dressing or bathing? Y -  Doing errands, shopping? [place] and eating ? - Y  Using the Toilet? - N  In the past six months, have you accidently leaked urine? - N  Comment - -  Do you have problems with loss of bowel control? - N  Comment - -  Managing your Medications? - N  Managing your Finances? - N  Housekeeping or managing your Housekeeping? - N  Some recent data might be hidden    Fall/Depression Screening:  Fall Risk  12/22/2016 12/21/2016 11/17/2016  Falls in the past year? No No No   PHQ 2/9 Scores 11/17/2016 11/03/2016 10/24/2016 04/20/2016 02/19/2016 06/24/2015 12/04/2014  PHQ - 2 Score 0 0 0 0 0 0 0    Assessment:  CSW spoke with patient's daughter, [name], who is with patient today. She reports she is doing fine but has not been able to find the RX for the Januvia (changed from 144m to 245m.  CSW helped daughter problem solve and daughter plans to call the mail order pharmacy and the PCP if needed. "I don't know if she lost the prescription or if it was called in somewhere".  CSW asked daughter to call back if it is not resolved today.   Daughter still planning to review resource material  mailed to them for community linking/services and then discuss further with CSW.  CSW will plan a f/u call to patient and daughter next week to review and discuss interest further.     Plan:  [facility] CM Care Plan Problem One     Most Recent Value  Care Plan Problem One  Patient can benefit from community resource support in the home due to cognition, physical and financial needs.  Role Documenting the Problem One  Clinical Social Worker  Care Plan for Problem One  Active  [facility] CM Short Term Goal #1   Patient and family will be linked with community resources to review and consider in the next 30 days.  [facility] CM Short Term Goal #1 Start Date  11/16/16  [facility] CM Short Term Goal #1 Met Date  12/16/16  Interventions for Short Term  Goal #1  CSW discussed and will provide resources to consider .  [facility] CM Short Term Goal #2   Patient and family will report progress in completion of Adv. DIirectives in the next 30 days.  [facility] CM Short Term Goal #2 Start Date  11/16/16  [facility] CM Short Term Goal #2 Met Date  12/16/16  Interventions for Short Term Goal #2  CSW educated and provided Advance Directive packet for completion.      Eduard Clos, MSW, [place] Worker  [place] [phone]

## 2016-12-23 ENCOUNTER — Other Ambulatory Visit: Payer: Self-pay | Admitting: Internal Medicine

## 2016-12-23 ENCOUNTER — Telehealth: Payer: Self-pay | Admitting: *Deleted

## 2016-12-23 ENCOUNTER — Ambulatory Visit: Payer: Self-pay | Admitting: *Deleted

## 2016-12-23 NOTE — Telephone Encounter (Signed)
Tresa Endo with [facility] called and wanted to confirm patient's Allergies. Stated that in her allergy list it states Tradjenta but they are sending out Januvia. Pharmacist wanted to know the side effects. Read her the discharge summary from the hospital. She stated she felt better about shipping the medications to patient.

## 2016-12-28 ENCOUNTER — Ambulatory Visit: Payer: Medicare Other | Admitting: Internal Medicine

## 2016-12-29 ENCOUNTER — Ambulatory Visit: Payer: Self-pay | Admitting: *Deleted

## 2017-01-05 ENCOUNTER — Ambulatory Visit: Payer: Self-pay | Admitting: *Deleted

## 2017-01-05 ENCOUNTER — Ambulatory Visit (INDEPENDENT_AMBULATORY_CARE_PROVIDER_SITE_OTHER): Payer: Medicare Other | Admitting: Nurse Practitioner

## 2017-01-05 ENCOUNTER — Encounter: Payer: Self-pay | Admitting: Nurse Practitioner

## 2017-01-05 VITALS — BP 146/68 | HR 63 | Temp 97.6°F | Resp 17 | Ht 60.0 in | Wt 121.8 lb

## 2017-01-05 DIAGNOSIS — N183 Chronic kidney disease, stage 3 unspecified: Secondary | ICD-10-CM

## 2017-01-05 DIAGNOSIS — I5032 Chronic diastolic (congestive) heart failure: Secondary | ICD-10-CM | POA: Diagnosis not present

## 2017-01-05 DIAGNOSIS — I10 Essential (primary) hypertension: Secondary | ICD-10-CM

## 2017-01-05 LAB — BASIC METABOLIC PANEL WITH GFR
BUN/Creatinine Ratio: 16 (calc) (ref 6–22)
BUN: 30 mg/dL — ABNORMAL HIGH (ref 7–25)
CO2: 29 mmol/L (ref 20–32)
CREATININE: 1.84 mg/dL — AB (ref 0.60–0.88)
Calcium: 9 mg/dL (ref 8.6–10.4)
Chloride: 107 mmol/L (ref 98–110)
GFR, EST NON AFRICAN AMERICAN: 24 mL/min/{1.73_m2} — AB (ref >=60)
GFR, Est African American: 28 mL/min/{1.73_m2} — ABNORMAL LOW (ref >=60)
GLUCOSE: 132 mg/dL (ref 65–139)
Potassium: 4.8 mmol/L (ref 3.5–5.3)
SODIUM: 143 mmol/L (ref 135–146)

## 2017-01-05 NOTE — Progress Notes (Signed)
Careteam: Patient Care Team: Gildardo Cranker, DO as PCP - General (Internal Medicine) Burtis Junes, NP as Nurse Practitioner (Cardiology) Hayden Pedro, MD as Consulting Physician (Ophthalmology) Gayland Curry, DO as Consulting Physician (Geriatric Medicine) Elam Dutch, MD as Consulting Physician (Vascular Surgery) Ronald Lobo, MD as Consulting Physician (Gastroenterology) Sherren Mocha, MD as Consulting Physician (Cardiology) Michael Boston, MD as Consulting Physician (General Surgery) Deirdre Peer, LCSW as [place] Management  Advanced Directive information Does Patient Have a Medical Advance Directive?: No  Allergies  Allergen Reactions  . Namzaric [Memantine Hcl-Donepezil Hcl] Nausea Only    confusion  . Percocet [Oxycodone-Acetaminophen] Other (See Comments)    loosy goosy  . Sulfa Antibiotics Other (See Comments)    Tongue swells  . Adhesive [Tape] Other (See Comments)    Tears skin.  Please use "paper" tape  . Metformin And Related Diarrhea    Abnormal Kidney Functions   . Tradjenta [Linagliptin]     Low back ache, resolved once medication stopped   . Penicillins Hives    Tolerates Ceftriaxone, Has patient had a PCN reaction causing immediate rash, facial/tongue/throat swelling, SOB or lightheadedness with hypotension: Unknown Has patient had a PCN reaction causing severe rash involving mucus membranes or skin necrosis: No Has patient had a PCN reaction that required hospitalization No Has patient had a PCN reaction occurring within the last 10 years: No If all of the above answers are "NO", then may proceed with Cephalosporin use.     Chief Complaint  Patient presents with  . Follow-up    Pt is being seen for a 2 week follow up on BP.      HPI: Patient is a 83 y.o. female seen in the office today for follow up BP. pt was seen for hospital follow up and labs revealed worsen renal function. her ACE inhibitor was  stopped and lasix reduced. Pt with hx of dementia, CHF, CAD, HTN, PAD, CKD, gout and others. Daughter not allowed in room today per pt but she states that she has checked mychart and keeps up to date on medications this way. Daughter reports medication was taken this morning. Pt feels like her legs are swollen today. No increase in shortness of breath, chest pains.  Review of Systems:  Review of Systems  Unable to perform ROS: Dementia    Past Medical History:  Diagnosis Date  . Anemia   . Anxiety disorder   . Carotid artery occlusion   . Carotid bruit   . Chronic kidney disease (CKD), stage III (moderate) (HCC)   . Coronary artery disease    Remote PCI. Had BMS to RCA and DES stent to Diagonal in Jan. 2012   . Diastolic heart failure    EF 55 to 60% per cath in Jan 2012  . DVT (deep venous thrombosis) ([place])   . Gout   . Hyperlipidemia   . Hypertension   . Ischemic cardiomyopathy    with prior EF of 35%  . NSTEMI (non-ST elevated myocardial infarction) [facility]) Jan 2012   with PCI to RCA and DX per Dr. Burt Knack  . Obesity   . Pneumonia 2016  . Proteinuria   . Stroke [facility]) 2013   ?  mini    . TIA (transient ischemic attack)   . Type II diabetes mellitus ([place])    long standing   Past Surgical History:  Procedure Laterality Date  . ABDOMINAL AORTAGRAM N/A 01/17/2014   Procedure: ABDOMINAL  Maxcine Ham;  Surgeon: Elam Dutch, MD;  Location: [facility] CATH LAB;  Service: Cardiovascular;  Laterality: N/A;  . ANKLE FUSION Bilateral   . APPENDECTOMY    . CATARACT EXTRACTION, BILATERAL Bilateral   . CORONARY ANGIOPLASTY WITH STENT PLACEMENT  04/05/2010   distal RCA  . FRACTURE SURGERY Right 2005   bilateral ankles   . TONSILLECTOMY     Social History:   reports that she has quit smoking. She has never used smokeless tobacco. She reports that she does not drink alcohol or use drugs.  History reviewed. No pertinent family history.  Medications: Patient's Medications  New  Prescriptions   No medications on file  Previous Medications   ASPIRIN EC 81 MG TABLET    Take 1 tablet (81 mg total) by mouth daily.   BYSTOLIC 20 MG TABS    TAKE 1 TABLET (20 MG TOTAL) BY MOUTH DAILY. FOR BLOOD PRESSURE   CLONIDINE (CATAPRES) 0.1 MG TABLET    Take 1 tablet (0.1 mg total) by mouth daily. For high blood pressure   DOCUSATE SODIUM (COLACE) 100 MG CAPSULE    Take 1 capsule (100 mg total) by mouth 2 (two) times daily as needed for mild constipation.   EZETIMIBE-SIMVASTATIN (VYTORIN) 10-20 MG TABLET    Take 1/2 tablet by mouth once nightly to control cholesterol   FEEDING SUPPLEMENT, ENSURE ENLIVE, (ENSURE ENLIVE) LIQD    Take 237 mLs by mouth 2 (two) times daily between meals.   FUROSEMIDE (LASIX) 20 MG TABLET    Take 1 tablet (20 mg total) by mouth daily. For Hypertension   LOTEPREDNOL ETABONATE (LOTEMAX) 0.5 % GEL    PLACE 1 DROP IN THE LEFT EYE TWICE DAILY   NITROGLYCERIN (NITROSTAT) 0.4 MG SL TABLET    Place one under tongue for chest pain, up to 3 tablets   SITAGLIPTIN (JANUVIA) 25 MG TABLET    Take 1 tablet (25 mg total) by mouth daily.   TIMOLOL (BETIMOL) 0.5 % OPHTHALMIC SOLUTION    INSTILL 1 DROP INTO THE LEFT EYE DAILY  Modified Medications   No medications on file  Discontinued Medications   No medications on file     Physical Exam:  Vitals:   01/05/17 1010  BP: (!) 146/88  Pulse: 63  Resp: 17  Temp: 97.6 F (36.4 C)  TempSrc: Oral  SpO2: 95%  Weight: 121 lb 12.8 oz (55.2 kg)  Height: 5' (1.524 m)   Body mass index is 23.79 kg/m.  Physical Exam  Constitutional: She appears well-developed and well-nourished. No distress.  Cardiovascular: Normal rate, regular rhythm, normal heart sounds and intact distal pulses.   Pulmonary/Chest: Effort normal and breath sounds normal. No respiratory distress. She has no wheezes.  Musculoskeletal: She exhibits edema (1+bilaterally).       Right ankle: She exhibits swelling (Mild pitting).       Left ankle: She  exhibits swelling (mild pitting).  Skin: Skin is warm and dry. Capillary refill takes 2 to 3 seconds. She is not diaphoretic. No erythema.  Psychiatric: She has a normal mood and affect. Cognition and memory are impaired.  Nursing note and vitals reviewed.   Labs reviewed: Basic Metabolic Panel:  Recent Labs  09/08/16 1523  12/10/16 0425 12/11/16 0433 12/21/16 1518  NA  --   < > 144 142 143  K  --   < > 3.3* 4.4 5.1  CL  --   < > 114* 116* 112*  CO2  --   < >  21* 19* 22  GLUCOSE  --   < > 105* 146* 111  BUN  --   < > 22* 25* 28*  CREATININE  --   < > 2.01* 1.82* 2.27*  CALCIUM  --   < > 8.1* 8.1* 8.7  MG  --   --  1.7  --   --   TSH 0.78  --   --   --   --   < > = values in this interval not displayed. Liver Function Tests:  Recent Labs  10/01/16 1523 11/26/16 1725 12/09/16 0056 12/21/16 1518  AST 14* _0 ALT 9* 9* 10* 6  ALKPHOS 49 70 52  --   BILITOT 0.5 0.6 0.4 0.5  PROT 5.7* 6.9 6.1* 6.0*  ALBUMIN 3.1* 3.6 3.4*  --     Recent Labs  04/29/16 1407 10/01/16 1523 11/26/16 1725  LIPASE 33 21 18   No results for input(s): AMMONIA in the last 8760 hours. CBC:  Recent Labs  11/26/16 1725 12/09/16 0056 12/09/16 0452 12/21/16 1518  WBC 5.0 4.3 4.6 4.6  NEUTROABS 3.0 2.4  --  2,709  HGB 12.0 11.0* 10.2* 10.4*  HCT 37.0 34.4* 31.3* 32.6*  MCV 92.5 93.2 92.9 91.8  PLT 226 218 195 206   Lipid Panel:  Recent Labs  04/20/16 1351 08/29/16 1051  CHOL 148 130  HDL 42* 39*  LDLCALC 71 52  TRIG 173* 196*  CHOLHDL 3.5 3.3   TSH:  Recent Labs  09/08/16 1523  TSH 0.78   A1C: Lab Results  Component Value Date   HGBA1C 7.1 (H) 08/29/2016     Assessment/Plan 1. Essential hypertension Stable, improved blood pressure on recheck to 694/50, Continue Bystolic 20 mg take one tablet by mouth daily and Clonidine 0.1 mg tablet  by mouth daily for hypertension. - BMP with eGFR  2. Chronic kidney disease, stage III (moderate) (HCC) -encouraged  proper hydration.  - BMP with eGFR  3. Chronic diastolic heart failure, NYHA class 1 (HCC) -stable, no significant fluid retention. Weight actually down from last OV.  - BMP with eGFR  Next appt: 1 month with Dr Thayer Headings K. Harle Battiest  [facility] [phone] 8 am - 5 pm) [phone] (after hours)

## 2017-01-05 NOTE — Patient Instructions (Signed)
Cont current medications, check blood pressure at home and notify us if it remains over 140/90 after you have taken medication.

## 2017-01-09 ENCOUNTER — Telehealth: Payer: Self-pay

## 2017-01-09 ENCOUNTER — Emergency Department (HOSPITAL_COMMUNITY)
Admission: EM | Admit: 2017-01-09 | Discharge: 2017-01-09 | Disposition: A | Discharge status: Home / Self Care | Payer: Medicare Other | Attending: Emergency Medicine | Admitting: Emergency Medicine

## 2017-01-09 ENCOUNTER — Encounter (HOSPITAL_COMMUNITY): Payer: Self-pay | Admitting: Nurse Practitioner

## 2017-01-09 DIAGNOSIS — K623 Rectal prolapse: Secondary | ICD-10-CM | POA: Insufficient documentation

## 2017-01-09 DIAGNOSIS — E1122 Type 2 diabetes mellitus with diabetic chronic kidney disease: Secondary | ICD-10-CM | POA: Insufficient documentation

## 2017-01-09 DIAGNOSIS — I130 Hypertensive heart and chronic kidney disease with heart failure and stage 1 through stage 4 chronic kidney disease, or unspecified chronic kidney disease: Secondary | ICD-10-CM | POA: Diagnosis not present

## 2017-01-09 DIAGNOSIS — Z87891 Personal history of nicotine dependence: Secondary | ICD-10-CM | POA: Insufficient documentation

## 2017-01-09 DIAGNOSIS — I509 Heart failure, unspecified: Secondary | ICD-10-CM | POA: Insufficient documentation

## 2017-01-09 DIAGNOSIS — Z79899 Other long term (current) drug therapy: Secondary | ICD-10-CM | POA: Diagnosis not present

## 2017-01-09 DIAGNOSIS — R195 Other fecal abnormalities: Secondary | ICD-10-CM | POA: Diagnosis present

## 2017-01-09 DIAGNOSIS — N183 Chronic kidney disease, stage 3 (moderate): Secondary | ICD-10-CM | POA: Diagnosis not present

## 2017-01-09 DIAGNOSIS — Z7984 Long term (current) use of oral hypoglycemic drugs: Secondary | ICD-10-CM | POA: Diagnosis not present

## 2017-01-09 DIAGNOSIS — Z7982 Long term (current) use of aspirin: Secondary | ICD-10-CM | POA: Diagnosis not present

## 2017-01-09 DIAGNOSIS — I2510 Atherosclerotic heart disease of native coronary artery without angina pectoris: Secondary | ICD-10-CM | POA: Diagnosis not present

## 2017-01-09 MED ORDER — DOCUSATE SODIUM 100 MG PO CAPS: 100.0000 mg | ORAL_CAPSULE | Freq: Two times a day (BID) | ORAL | 0 refills | Status: DC

## 2017-01-09 NOTE — ED Notes (Signed)
Bed: [id number] Expected date:  Expected time:  Means of arrival:  Comments: 83 yo F/Prolapsed Rectum

## 2017-01-09 NOTE — Telephone Encounter (Signed)
Rectal prolapse is managed by a surgeon- recommend general surgery referral

## 2017-01-09 NOTE — ED Provider Notes (Signed)
TIME SEEN: 1:04 AM  CHIEF COMPLAINT: Rectal prolapse  HPI: Patient is an 83 year old female with history of CAD, hypertension, hyperlipidemia, ischemic cardiomyopathy, diabetes, TIA who presents to the emergency department with a rectal prolapse. Reports she has been constipated over this past several days and had a bowel movement tonight and had rectal prolapse with a small amount of bright red blood. No abdominal pain. No fever. No vomiting. She has had the same many times before. Has been given outpatient surgical follow-up previously.  ROS: See HPI Constitutional: no fever  Eyes: no drainage  ENT: no runny nose   Cardiovascular:  no chest pain  Resp: no SOB  GI: no vomiting GU: no dysuria Integumentary: no rash  Allergy: no hives  Musculoskeletal: no leg swelling  Neurological: no slurred speech ROS otherwise negative  PAST MEDICAL HISTORY/PAST SURGICAL HISTORY:  Past Medical History:  Diagnosis Date  . Anemia   . Anxiety disorder   . Carotid artery occlusion   . Carotid bruit   . Chronic kidney disease (CKD), stage III (moderate) (HCC)   . Coronary artery disease    Remote PCI. Had BMS to RCA and DES stent to Diagonal in Jan. 2012   . Diastolic heart failure    EF 55 to 60% per cath in Jan 2012  . DVT (deep venous thrombosis) (HCC)   . Gout   . Hyperlipidemia   . Hypertension   . Ischemic cardiomyopathy    with prior EF of 35%  . NSTEMI (non-ST elevated myocardial infarction) [facility]) Jan 2012   with PCI to RCA and DX per Dr. Excell Seltzer  . Obesity   . Pneumonia 2016  . Proteinuria   . Stroke [facility]) 2013   ?  mini    . TIA (transient ischemic attack)   . Type II diabetes mellitus (HCC)    long standing    MEDICATIONS:  Prior to Admission medications   Medication Sig Start Date End Date Taking? Authorizing Provider  aspirin EC 81 MG tablet Take 1 tablet (81 mg total) by mouth daily. 12/11/16   Albertine Grates, MD  BYSTOLIC 20 MG TABS TAKE 1 TABLET (20 MG TOTAL) BY MOUTH  DAILY. FOR BLOOD PRESSURE 12/26/16   Sharon Seller, NP  cloNIDine (CATAPRES) 0.1 MG tablet Take 1 tablet (0.1 mg total) by mouth daily. For high blood pressure 09/28/16   Kirt Boys, DO  docusate sodium (COLACE) 100 MG capsule Take 1 capsule (100 mg total) by mouth 2 (two) times daily as needed for mild constipation. 08/24/16   Kirt Boys, DO  ezetimibe-simvastatin (VYTORIN) 10-20 MG tablet Take 1/2 tablet by mouth once nightly to control cholesterol 09/28/16   Kirt Boys, DO  feeding supplement, ENSURE ENLIVE, (ENSURE ENLIVE) LIQD Take 237 mLs by mouth 2 (two) times daily between meals. 12/11/16   Albertine Grates, MD  furosemide (LASIX) 20 MG tablet Take 1 tablet (20 mg total) by mouth daily. For Hypertension 12/22/16   Sharon Seller, NP  Loteprednol Etabonate (LOTEMAX) 0.5 % GEL PLACE 1 DROP IN THE LEFT EYE TWICE DAILY 09/28/16   Kirt Boys, DO  nitroGLYCERIN (NITROSTAT) 0.4 MG SL tablet Place one under tongue for chest pain, up to 3 tablets 09/28/16   Kirt Boys, DO  sitaGLIPtin (JANUVIA) 25 MG tablet Take 1 tablet (25 mg total) by mouth daily. 12/11/16   Albertine Grates, MD  timolol (BETIMOL) 0.5 % ophthalmic solution INSTILL 1 DROP INTO THE LEFT EYE DAILY 09/28/16   Kirt Boys, DO  ALLERGIES:  Allergies  Allergen Reactions  . Namzaric [Memantine Hcl-Donepezil Hcl] Nausea Only    confusion  . Percocet [Oxycodone-Acetaminophen] Other (See Comments)    loosy goosy  . Sulfa Antibiotics Other (See Comments)    Tongue swells  . Adhesive [Tape] Other (See Comments)    Tears skin.  Please use "paper" tape  . Metformin And Related Diarrhea    Abnormal Kidney Functions   . Tradjenta [Linagliptin]     Low back ache, resolved once medication stopped   . Penicillins Hives    Tolerates Ceftriaxone, Has patient had a PCN reaction causing immediate rash, facial/tongue/throat swelling, SOB or lightheadedness with hypotension: Unknown Has patient had a PCN reaction causing severe rash  involving mucus membranes or skin necrosis: No Has patient had a PCN reaction that required hospitalization No Has patient had a PCN reaction occurring within the last 10 years: No If all of the above answers are "NO", then may proceed with Cephalosporin use.     SOCIAL HISTORY:  Social History  Substance Use Topics  . Smoking status: Former Games developer  . Smokeless tobacco: Never Used     Comment: "smoked in my teens; quit after 1 year or so"  . Alcohol use No    FAMILY HISTORY: History reviewed. No pertinent family history.  EXAM: BP (!) 180/119   Pulse 72   Temp 98.3 F (36.8 C) (Oral)   Resp 20   SpO2 91%  CONSTITUTIONAL: Alert and oriented and responds appropriately to questions. Well-appearing; well-nourished HEAD: Normocephalic EYES: Conjunctivae clear, pupils appear equal, EOMI ENT: normal nose; moist mucous membranes NECK: Supple, no meningismus, no nuchal rigidity, no LAD  CARD: RRR; S1 and S2 appreciated; no murmurs, no clicks, no rubs, no gallops RESP: Normal chest excursion without splinting or tachypnea; breath sounds clear and equal bilaterally; no wheezes, no rhonchi, no rales, no hypoxia or respiratory distress, speaking full sentences ABD/GI: Normal bowel sounds; non-distended; soft, non-tender, no rebound, no guarding, no peritoneal signs, no hepatosplenomegaly RECTAL:  Patient has a moderate rectal prolapse that was easily reducible, and loose anal sphincter which has been documented previously, light brown loose stool on exam with very minimal amount of bright red blood, no melena, no hemorrhoids BACK:  The back appears normal and is non-tender to palpation, there is no CVA tenderness EXT: Normal ROM in all joints; non-tender to palpation; no edema; normal capillary refill; no cyanosis, no calf tenderness or swelling    SKIN: Normal color for age and race; warm; no rash NEURO: Moves all extremities equally PSYCH: The patient's mood and manner are appropriate.  Grooming and personal hygiene are appropriate.  MEDICAL DECISION MAKING: Patient here with rectal prolapse that was easily reducible. Patient reports feeling better. Otherwise exam benign. No active hemorrhage. Recommended stool softeners over the next several days and outpatient surgical follow-up. Recommended that she not strain when trying to have a bowel movement. Recommended increased water and fiber intake. Patient comfortable with this plan.   Reduction of rectal prolapse Date/Time: 1:42 AM Performed by: Raelyn Number Authorized by: Raelyn Number Consent: Verbal consent obtained. Risks and benefits: risks, benefits and alternatives were discussed Consent given by: patient Required items: required blood products, implants, devices, and special equipment available Time out: Immediately prior to procedure a "time out" was called to verify the correct patient, procedure, equipment, support staff and site/side marked as required.  Patient sedated: no  Vitals: Vital signs were monitored during sedation. Patient tolerance: Patient tolerated the  procedure well with no immediate complications.  Patient had a moderate rectal prolapse that was reduced using constant direct pressure. Prolapse successfully reduce completely. No complications.   At this time, I do not feel there is any life-threatening condition present. I have reviewed and discussed all results (EKG, imaging, lab, urine as appropriate) and exam findings with patient/family. I have reviewed nursing notes and appropriate previous records.  I feel the patient is safe to be discharged home without further emergent workup and can continue workup as an outpatient as needed. Discussed usual and customary return precautions. Patient/family verbalize understanding and are comfortable with this plan.  Outpatient follow-up has been provided if needed. All questions have been answered.      Caden Fatica, Layla MawKristen N, DO 01/09/17 [phone]

## 2017-01-09 NOTE — Telephone Encounter (Signed)
FYI: Arna Mediciora stated that patient was seen at ED last night due to rectal prolapse. Arna Mediciora would like to know if there is any advise that can be given on how to address rectal prolapse. Arna Mediciora is going to call gastro to see what can be done.    I tried calling patient's home number to speak with daughter that brings patient to appointments in order to schedule ED follow but there was no answer.

## 2017-01-09 NOTE — ED Triage Notes (Signed)
Pt is presented from home, c/o anal pain, reportedly while having a BM she "felt something was different" called daughter who confirmed the prolapse. Daughter and chart review collaborate that this occurred previously in may of this year. No reported bleeding.

## 2017-01-11 ENCOUNTER — Ambulatory Visit (INDEPENDENT_AMBULATORY_CARE_PROVIDER_SITE_OTHER): Payer: Medicare Other | Admitting: Ophthalmology

## 2017-01-11 DIAGNOSIS — E11311 Type 2 diabetes mellitus with unspecified diabetic retinopathy with macular edema: Secondary | ICD-10-CM | POA: Diagnosis not present

## 2017-01-11 DIAGNOSIS — H35033 Hypertensive retinopathy, bilateral: Secondary | ICD-10-CM

## 2017-01-11 DIAGNOSIS — H43813 Vitreous degeneration, bilateral: Secondary | ICD-10-CM

## 2017-01-11 DIAGNOSIS — E113313 Type 2 diabetes mellitus with moderate nonproliferative diabetic retinopathy with macular edema, bilateral: Secondary | ICD-10-CM

## 2017-01-11 DIAGNOSIS — I10 Essential (primary) hypertension: Secondary | ICD-10-CM | POA: Diagnosis not present

## 2017-01-12 ENCOUNTER — Ambulatory Visit: Payer: Self-pay | Admitting: *Deleted

## 2017-01-12 ENCOUNTER — Other Ambulatory Visit: Payer: Self-pay | Admitting: *Deleted

## 2017-01-13 ENCOUNTER — Telehealth: Payer: Self-pay | Admitting: *Deleted

## 2017-01-13 ENCOUNTER — Ambulatory Visit (INDEPENDENT_AMBULATORY_CARE_PROVIDER_SITE_OTHER): Payer: Medicare Other | Admitting: Internal Medicine

## 2017-01-13 ENCOUNTER — Encounter: Payer: Self-pay | Admitting: Internal Medicine

## 2017-01-13 VITALS — BP 160/62 | HR 59 | Temp 97.6°F | Ht 60.0 in | Wt 123.8 lb

## 2017-01-13 DIAGNOSIS — K623 Rectal prolapse: Secondary | ICD-10-CM

## 2017-01-13 DIAGNOSIS — K649 Unspecified hemorrhoids: Secondary | ICD-10-CM | POA: Diagnosis not present

## 2017-01-13 NOTE — Patient Instructions (Addendum)
Will call with surgery referral  Continue current medications as ordered  Follow up as scheduled in November or sooner if you need be

## 2017-01-13 NOTE — Telephone Encounter (Signed)
Spoke with patient's daughter to inform her of surgery appt. She said she had already talked with them and is considering cancelling the appt. She said the last time she went for a consult, her cardiologist said she wasn't strong enough to withstand a surgery of that magnitude. She asks that you review the cardiology notes and see what you think. She may go ahead and take her just to see if the surgeon thinks he can do anything at all possibly less invasive. She just asks that you look at notes and let her know what you think.

## 2017-01-13 NOTE — Patient Outreach (Signed)
[facility] Care Management  01/13/2017  [name] 02/16/1928 [id number]   CSW attempted to contact patient and daughter, [name], today without success. A HIPAA compliant message was left on voicemail. CSW will try again in the next 7-10 days if no return call is received.    Reece LevyJanet Tayona Sarnowski, MSW, LCSW Clinical Social Worker  [facility] [phone]

## 2017-01-13 NOTE — Progress Notes (Signed)
Patient ID: [name], female   DOB: 1928-02-06, 84 y.o.   MRN: [id number]    Location:  [facility] Place of Service: OFFICE  Chief Complaint  Patient presents with  . Follow-up    ED follow-up   . Medication Refill    No medication refills needed     HPI:  83 yo female seen today for ED f/u. She presented to the ED with rectal bleeding and was found to have rectal prolapse. It was reduced by ED provider without complication. No further bleeding. No constipation, diarrhea, BRBPR/melena, abdominal pain. No f/c. She is a poor historian due to cognitive impairment. Hx obtained from chart  DM - A1c 7.1%. No low BS reactions. She takes [place]  HTN - suboptimally controlled. Takes bystolic, clonidine, cozaar. SBP 150-160s. Suspect noncompliance  Dementia - she refuses to take med for cognition. last MMSE 25/30 in 2017  CKD - stage 4. Cr 1.84  Past Medical History:  Diagnosis Date  . Anemia   . Anxiety disorder   . Carotid artery occlusion   . Carotid bruit   . Chronic kidney disease (CKD), stage III (moderate) (HCC)   . Coronary artery disease    Remote PCI. Had BMS to RCA and DES stent to Diagonal in Jan. 2012   . Diastolic heart failure    EF 55 to 60% per cath in Jan 2012  . DVT (deep venous thrombosis) ([place])   . Gout   . Hyperlipidemia   . Hypertension   . Ischemic cardiomyopathy    with prior EF of 35%  . NSTEMI (non-ST elevated myocardial infarction) [facility]) Jan 2012   with PCI to RCA and DX per Dr. Burt Knack  . Obesity   . Pneumonia 2016  . Proteinuria   . Stroke [facility]) 2013   ?  mini    . TIA (transient ischemic attack)   . Type II diabetes mellitus ([place])    long standing    Past Surgical History:  Procedure Laterality Date  . ABDOMINAL AORTAGRAM N/A 01/17/2014   Procedure: ABDOMINAL Maxcine Ham;  Surgeon: Elam Dutch, MD;  Location: [facility] CATH LAB;  Service: Cardiovascular;  Laterality: N/A;  . ANKLE FUSION Bilateral   . APPENDECTOMY    . CATARACT EXTRACTION,  BILATERAL Bilateral   . CORONARY ANGIOPLASTY WITH STENT PLACEMENT  04/05/2010   distal RCA  . FRACTURE SURGERY Right 2005   bilateral ankles   . TONSILLECTOMY      Patient Care Team: Gildardo Cranker, DO as PCP - General (Internal Medicine) Burtis Junes, NP as Nurse Practitioner (Cardiology) Hayden Pedro, MD as Consulting Physician (Ophthalmology) Gayland Curry, DO as Consulting Physician (Geriatric Medicine) Elam Dutch, MD as Consulting Physician (Vascular Surgery) Ronald Lobo, MD as Consulting Physician (Gastroenterology) Sherren Mocha, MD as Consulting Physician (Cardiology) Michael Boston, MD as Consulting Physician (General Surgery) Deirdre Peer, LCSW as [facility] [employer]  Social History   Social History  . Marital status: Widowed    Spouse name: N/A  . Number of children: N/A  . Years of education: N/A   Occupational History  . retired OR Marine scientist    Social History Main Topics  . Smoking status: Former Research scientist (life sciences)  . Smokeless tobacco: Never Used     Comment: "smoked in my teens; quit after 1 year or so"  . Alcohol use No  . Drug use: No  . Sexual activity: No   Other Topics Concern  . Not on file  Social History Narrative   Widow   Former smoker   Alcohol none   No Advance Directive        reports that she has quit smoking. She has never used smokeless tobacco. She reports that she does not drink alcohol or use drugs.  No family history on file. Family Status  Relation Status  . Mother Deceased       murdered  . Father Deceased       Died while working  . MGM Deceased  . MGF Deceased  . PGM Deceased  . PGF Deceased  . Daughter Alive  . Daughter Alive  . Sister Deceased       Died at 76 years old     Allergies  Allergen Reactions  . Namzaric [Memantine Hcl-Donepezil Hcl] Nausea Only    confusion  . Percocet [Oxycodone-Acetaminophen] Other (See Comments)    loosy goosy  . Sulfa Antibiotics Other  (See Comments)    Tongue swells  . Adhesive [Tape] Other (See Comments)    Tears skin.  Please use "paper" tape  . Metformin And Related Diarrhea    Abnormal Kidney Functions   . Tradjenta [Linagliptin]     Low back ache, resolved once medication stopped   . Penicillins Hives    Tolerates Ceftriaxone, Has patient had a PCN reaction causing immediate rash, facial/tongue/throat swelling, SOB or lightheadedness with hypotension: Unknown Has patient had a PCN reaction causing severe rash involving mucus membranes or skin necrosis: No Has patient had a PCN reaction that required hospitalization No Has patient had a PCN reaction occurring within the last 10 years: No If all of the above answers are "NO", then may proceed with Cephalosporin use.     Medications: Patient's Medications  New Prescriptions   No medications on file  Previous Medications   ASPIRIN EC 81 MG TABLET    Take 1 tablet (81 mg total) by mouth daily.   BYSTOLIC 20 MG TABS    TAKE 1 TABLET (20 MG TOTAL) BY MOUTH DAILY. FOR BLOOD PRESSURE   CLONIDINE (CATAPRES) 0.1 MG TABLET    Take 1 tablet (0.1 mg total) by mouth daily. For high blood pressure   DOCUSATE SODIUM (COLACE) 100 MG CAPSULE    Take 1 capsule (100 mg total) by mouth every 12 (twelve) hours.   EZETIMIBE-SIMVASTATIN (VYTORIN) 10-20 MG TABLET    Take 1/2 tablet by mouth once nightly to control cholesterol   FEEDING SUPPLEMENT, ENSURE ENLIVE, (ENSURE ENLIVE) LIQD    Take 237 mLs by mouth 2 (two) times daily between meals.   FUROSEMIDE (LASIX) 20 MG TABLET    Take 1 tablet (20 mg total) by mouth daily. For Hypertension   LOTEPREDNOL ETABONATE (LOTEMAX) 0.5 % GEL    PLACE 1 DROP IN THE LEFT EYE TWICE DAILY   NITROGLYCERIN (NITROSTAT) 0.4 MG SL TABLET    Place one under tongue for chest pain, up to 3 tablets   SITAGLIPTIN (JANUVIA) 25 MG TABLET    Take 1 tablet (25 mg total) by mouth daily.   TIMOLOL (BETIMOL) 0.5 % OPHTHALMIC SOLUTION    INSTILL 1 DROP INTO THE LEFT  EYE DAILY  Modified Medications   No medications on file  Discontinued Medications   DOCUSATE SODIUM (COLACE) 100 MG CAPSULE    Take 1 capsule (100 mg total) by mouth 2 (two) times daily as needed for mild constipation.    Review of Systems  Unable to perform ROS: Other (cognitive impairment)  Vitals:   01/13/17 0909  BP: (!) 160/62  Pulse: (!) 59  Temp: 97.6 F (36.4 C)  TempSrc: Oral  SpO2: 92%  Weight: 123 lb 12.8 oz (56.2 kg)  Height: 5' (1.524 m)   Body mass index is 24.18 kg/m.  Physical Exam  Constitutional: She appears well-developed and well-nourished.  Frail appearing in NAD  HENT:  Mouth/Throat: Oropharynx is clear and moist. No oropharyngeal exudate.  MMM; no oral thrush  Eyes: Pupils are equal, round, and reactive to light. No scleral icterus.  Neck: Neck supple. Carotid bruit is present (b/l systolic). No tracheal deviation present. No thyromegaly present.  Cardiovascular: Normal rate, regular rhythm and intact distal pulses.  Exam reveals no gallop and no friction rub.   Murmur heard.  Systolic murmur is present with a grade of 2/6  SEM -->carotid b/l. No LE edema b/l. No calf TTP  Pulmonary/Chest: Effort normal and breath sounds normal. No stridor. No respiratory distress. She has no wheezes. She has no rales.  Reduced BS b/l  Abdominal: Soft. Normal appearance and bowel sounds are normal. She exhibits no distension and no mass. There is no hepatomegaly. There is no tenderness. There is no rigidity, no rebound and no guarding. No hernia.  Genitourinary: Rectal exam shows external hemorrhoid. Rectal exam shows no mass and no tenderness.  Genitourinary Comments: No obvious prolapse  Musculoskeletal: She exhibits edema and deformity (kyphosis).  Lymphadenopathy:    She has no cervical adenopathy.  Neurological: She is alert. She has normal reflexes. Gait (antalgic and unsteady) abnormal.  Skin: Skin is warm and dry. No rash noted.  Psychiatric: She has a  normal mood and affect. Her behavior is normal.     Labs reviewed: Office Visit on 01/05/2017  Component Date Value Ref Range Status  . Glucose, Bld 01/05/2017 132  65 - 139 mg/dL Final   Comment: .        Non-fasting reference interval .   . BUN 01/05/2017 30* 7 - 25 mg/dL Final  . Creat 01/05/2017 1.84* 0.60 - 0.88 mg/dL Final   Comment: For patients >80 years of age, the reference limit for Creatinine is approximately 13% higher for people identified as African-American. .   . GFR, Est Non African American 01/05/2017 24* > OR = 60 mL/min/1.51m Final  . GFR, Est African American 01/05/2017 28* > OR = 60 mL/min/1.778mFinal  . BUN/Creatinine Ratio 01/05/2017 16  6 - 22 (calc) Final  . Sodium 01/05/2017 143  135 - 146 mmol/L Final  . Potassium 01/05/2017 4.8  3.5 - 5.3 mmol/L Final  . Chloride 01/05/2017 107  98 - 110 mmol/L Final  . CO2 01/05/2017 29  20 - 32 mmol/L Final  . Calcium 01/05/2017 9.0  8.6 - 10.4 mg/dL Final  Office Visit on 12/21/2016  Component Date Value Ref Range Status  . WBC 12/21/2016 4.6  3.8 - 10.8 Thousand/uL Final  . RBC 12/21/2016 3.55* 3.80 - 5.10 Million/uL Final  . Hemoglobin 12/21/2016 10.4* 11.7 - 15.5 g/dL Final  . HCT 12/21/2016 32.6* 35.0 - 45.0 % Final  . MCV 12/21/2016 91.8  80.0 - 100.0 fL Final  . MCH 12/21/2016 29.3  27.0 - 33.0 pg Final  . MCHC 12/21/2016 31.9* 32.0 - 36.0 g/dL Final  . RDW 12/21/2016 12.9  11.0 - 15.0 % Final  . Platelets 12/21/2016 206  140 - 400 Thousand/uL Final  . MPV 12/21/2016 11.2  7.5 - 12.5 fL Final  . Neutro Abs 12/21/2016 2709  1,500 - 7,800 cells/uL Final  . Lymphs Abs 12/21/2016 1389  850 - 3,900 cells/uL Final  . WBC mixed population 12/21/2016 345  200 - 950 cells/uL Final  . Eosinophils Absolute 12/21/2016 138  15 - 500 cells/uL Final  . Basophils Absolute 12/21/2016 18  0 - 200 cells/uL Final  . Neutrophils Relative % 12/21/2016 58.9  % Final  . Total Lymphocyte 12/21/2016 30.2  % Final  .  Monocytes Relative 12/21/2016 7.5  % Final  . Eosinophils Relative 12/21/2016 3.0  % Final  . Basophils Relative 12/21/2016 0.4  % Final  . Glucose, Bld 12/21/2016 111  65 - 139 mg/dL Final   Comment: .        Non-fasting reference interval .   . BUN 12/21/2016 28* 7 - 25 mg/dL Final  . Creat 12/21/2016 2.27* 0.60 - 0.88 mg/dL Final   Comment: For patients >73 years of age, the reference limit for Creatinine is approximately 13% higher for people identified as African-American. .   . GFR, Est Non African American 12/21/2016 19* > OR = 60 mL/min/1.27m Final  . GFR, Est African American 12/21/2016 22* > OR = 60 mL/min/1.722mFinal  . BUN/Creatinine Ratio 12/21/2016 12  6 - 22 (calc) Final  . Sodium 12/21/2016 143  135 - 146 mmol/L Final  . Potassium 12/21/2016 5.1  3.5 - 5.3 mmol/L Final  . Chloride 12/21/2016 112* 98 - 110 mmol/L Final  . CO2 12/21/2016 22  20 - 32 mmol/L Final  . Calcium 12/21/2016 8.7  8.6 - 10.4 mg/dL Final  . Total Protein 12/21/2016 6.0* 6.1 - 8.1 g/dL Final  . Albumin 12/21/2016 3.5* 3.6 - 5.1 g/dL Final  . Globulin 12/21/2016 2.5  1.9 - 3.7 g/dL (calc) Final  . AG Ratio 12/21/2016 1.4  1.0 - 2.5 (calc) Final  . Total Bilirubin 12/21/2016 0.5  0.2 - 1.2 mg/dL Final  . Alkaline phosphatase (APISO) 12/21/2016 50  33 - 130 U/L Final  . AST 12/21/2016 16  10 - 35 U/L Final  . ALT 12/21/2016 6  6 - 29 U/L Final  Admission on 12/08/2016, Discharged on 12/11/2016  Component Date Value Ref Range Status  . WBC 12/09/2016 4.3  4.0 - 10.5 K/uL Final  . RBC 12/09/2016 3.69* 3.87 - 5.11 MIL/uL Final  . Hemoglobin 12/09/2016 11.0* 12.0 - 15.0 g/dL Final  . HCT 12/09/2016 34.4* 36.0 - 46.0 % Final  . MCV 12/09/2016 93.2  78.0 - 100.0 fL Final  . MCH 12/09/2016 29.8  26.0 - 34.0 pg Final  . MCHC 12/09/2016 32.0  30.0 - 36.0 g/dL Final  . RDW 12/09/2016 14.6  11.5 - 15.5 % Final  . Platelets 12/09/2016 218  150 - 400 K/uL Final  . Neutrophils Relative % 12/09/2016 55   % Final  . Neutro Abs 12/09/2016 2.4  1.7 - 7.7 K/uL Final  . Lymphocytes Relative 12/09/2016 32  % Final  . Lymphs Abs 12/09/2016 1.4  0.7 - 4.0 K/uL Final  . Monocytes Relative 12/09/2016 9  % Final  . Monocytes Absolute 12/09/2016 0.4  0.1 - 1.0 K/uL Final  . Eosinophils Relative 12/09/2016 3  % Final  . Eosinophils Absolute 12/09/2016 0.1  0.0 - 0.7 K/uL Final  . Basophils Relative 12/09/2016 1  % Final  . Basophils Absolute 12/09/2016 0.0  0.0 - 0.1 K/uL Final  . Sodium 12/09/2016 146* 135 - 145 mmol/L Final  . Potassium 12/09/2016 3.3* 3.5 - 5.1 mmol/L Final  .  Chloride 12/09/2016 113* 101 - 111 mmol/L Final  . CO2 12/09/2016 23  22 - 32 mmol/L Final  . Glucose, Bld 12/09/2016 98  65 - 99 mg/dL Final  . BUN 12/09/2016 28* 6 - 20 mg/dL Final  . Creatinine, Ser 12/09/2016 2.53* 0.44 - 1.00 mg/dL Final  . Calcium 12/09/2016 8.5* 8.9 - 10.3 mg/dL Final  . Total Protein 12/09/2016 6.1* 6.5 - 8.1 g/dL Final  . Albumin 12/09/2016 3.4* 3.5 - 5.0 g/dL Final  . AST 12/09/2016 20  15 - 41 U/L Final  . ALT 12/09/2016 10* 14 - 54 U/L Final  . Alkaline Phosphatase 12/09/2016 52  38 - 126 U/L Final  . Total Bilirubin 12/09/2016 0.4  0.3 - 1.2 mg/dL Final  . GFR calc non Af Amer 12/09/2016 16* >60 mL/min Final  . GFR calc Af Amer 12/09/2016 19* >60 mL/min Final   Comment: (NOTE) The eGFR has been calculated using the CKD EPI equation. This calculation has not been validated in all clinical situations. eGFR's persistently <60 mL/min signify possible Chronic Kidney Disease.   . Anion gap 12/09/2016 10  5 - 15 Final  . C Diff antigen 12/09/2016 NEGATIVE  NEGATIVE Final  . C Diff toxin 12/09/2016 NEGATIVE  NEGATIVE Final  . C Diff interpretation 12/09/2016 No C. difficile detected.   Final  . Campylobacter species 12/09/2016 NOT DETECTED  NOT DETECTED Final  . Plesimonas shigelloides 12/09/2016 NOT DETECTED  NOT DETECTED Final  . Salmonella species 12/09/2016 NOT DETECTED  NOT DETECTED Final    . Yersinia enterocolitica 12/09/2016 NOT DETECTED  NOT DETECTED Final  . Vibrio species 12/09/2016 NOT DETECTED  NOT DETECTED Final  . Vibrio cholerae 12/09/2016 NOT DETECTED  NOT DETECTED Final  . Enteroaggregative E coli (EAEC) 12/09/2016 NOT DETECTED  NOT DETECTED Final  . Enteropathogenic E coli (EPEC) 12/09/2016 NOT DETECTED  NOT DETECTED Final  . Enterotoxigenic E coli (ETEC) 12/09/2016 NOT DETECTED  NOT DETECTED Final  . Shiga like toxin producing E coli * 12/09/2016 NOT DETECTED  NOT DETECTED Final  . Shigella/Enteroinvasive E coli (EI* 12/09/2016 NOT DETECTED  NOT DETECTED Final  . Cryptosporidium 12/09/2016 NOT DETECTED  NOT DETECTED Final  . Cyclospora cayetanensis 12/09/2016 NOT DETECTED  NOT DETECTED Final  . Entamoeba histolytica 12/09/2016 NOT DETECTED  NOT DETECTED Final  . Giardia lamblia 12/09/2016 NOT DETECTED  NOT DETECTED Final  . Adenovirus F40/41 12/09/2016 NOT DETECTED  NOT DETECTED Final  . Astrovirus 12/09/2016 NOT DETECTED  NOT DETECTED Final  . Norovirus GI/GII 12/09/2016 NOT DETECTED  NOT DETECTED Final  . Rotavirus A 12/09/2016 NOT DETECTED  NOT DETECTED Final  . Sapovirus (I, II, IV, and V) 12/09/2016 NOT DETECTED  NOT DETECTED Final  . Sodium 12/09/2016 147* 135 - 145 mmol/L Final  . Potassium 12/09/2016 3.1* 3.5 - 5.1 mmol/L Final  . Chloride 12/09/2016 116* 101 - 111 mmol/L Final  . CO2 12/09/2016 22  22 - 32 mmol/L Final  . Glucose, Bld 12/09/2016 91  65 - 99 mg/dL Final  . BUN 12/09/2016 25* 6 - 20 mg/dL Final  . Creatinine, Ser 12/09/2016 2.29* 0.44 - 1.00 mg/dL Final  . Calcium 12/09/2016 8.0* 8.9 - 10.3 mg/dL Final  . GFR calc non Af Amer 12/09/2016 18* >60 mL/min Final  . GFR calc Af Amer 12/09/2016 21* >60 mL/min Final   Comment: (NOTE) The eGFR has been calculated using the CKD EPI equation. This calculation has not been validated in all clinical situations. eGFR's persistently <60  mL/min signify possible Chronic Kidney Disease.   . Anion  gap 12/09/2016 9  5 - 15 Final  . WBC 12/09/2016 4.6  4.0 - 10.5 K/uL Final  . RBC 12/09/2016 3.37* 3.87 - 5.11 MIL/uL Final  . Hemoglobin 12/09/2016 10.2* 12.0 - 15.0 g/dL Final  . HCT 12/09/2016 31.3* 36.0 - 46.0 % Final  . MCV 12/09/2016 92.9  78.0 - 100.0 fL Final  . MCH 12/09/2016 30.3  26.0 - 34.0 pg Final  . MCHC 12/09/2016 32.6  30.0 - 36.0 g/dL Final  . RDW 12/09/2016 14.6  11.5 - 15.5 % Final  . Platelets 12/09/2016 195  150 - 400 K/uL Final  . Glucose-Capillary 12/09/2016 101* 65 - 99 mg/dL Final  . Glucose-Capillary 12/09/2016 96  65 - 99 mg/dL Final  . Glucose-Capillary 12/09/2016 135* 65 - 99 mg/dL Final  . Glucose-Capillary 12/09/2016 131* 65 - 99 mg/dL Final  . Sodium 12/10/2016 144  135 - 145 mmol/L Final  . Potassium 12/10/2016 3.3* 3.5 - 5.1 mmol/L Final  . Chloride 12/10/2016 114* 101 - 111 mmol/L Final  . CO2 12/10/2016 21* 22 - 32 mmol/L Final  . Glucose, Bld 12/10/2016 105* 65 - 99 mg/dL Final  . BUN 12/10/2016 22* 6 - 20 mg/dL Final  . Creatinine, Ser 12/10/2016 2.01* 0.44 - 1.00 mg/dL Final  . Calcium 12/10/2016 8.1* 8.9 - 10.3 mg/dL Final  . GFR calc non Af Amer 12/10/2016 21* >60 mL/min Final  . GFR calc Af Amer 12/10/2016 25* >60 mL/min Final   Comment: (NOTE) The eGFR has been calculated using the CKD EPI equation. This calculation has not been validated in all clinical situations. eGFR's persistently <60 mL/min signify possible Chronic Kidney Disease.   . Anion gap 12/10/2016 9  5 - 15 Final  . Magnesium 12/10/2016 1.7  1.7 - 2.4 mg/dL Final  . Glucose-Capillary 12/10/2016 91  65 - 99 mg/dL Final  . Glucose-Capillary 12/10/2016 122* 65 - 99 mg/dL Final  . Sodium 12/11/2016 142  135 - 145 mmol/L Final  . Potassium 12/11/2016 4.4  3.5 - 5.1 mmol/L Final   DELTA CHECK NOTED  . Chloride 12/11/2016 116* 101 - 111 mmol/L Final  . CO2 12/11/2016 19* 22 - 32 mmol/L Final  . Glucose, Bld 12/11/2016 146* 65 - 99 mg/dL Final  . BUN 12/11/2016 25* 6 - 20  mg/dL Final  . Creatinine, Ser 12/11/2016 1.82* 0.44 - 1.00 mg/dL Final  . Calcium 12/11/2016 8.1* 8.9 - 10.3 mg/dL Final  . GFR calc non Af Amer 12/11/2016 24* >60 mL/min Final  . GFR calc Af Amer 12/11/2016 28* >60 mL/min Final   Comment: (NOTE) The eGFR has been calculated using the CKD EPI equation. This calculation has not been validated in all clinical situations. eGFR's persistently <60 mL/min signify possible Chronic Kidney Disease.   . Anion gap 12/11/2016 7  5 - 15 Final  . Glucose-Capillary 12/10/2016 200* 65 - 99 mg/dL Final  . Glucose-Capillary 12/10/2016 149* 65 - 99 mg/dL Final  . Glucose-Capillary 12/11/2016 118* 65 - 99 mg/dL Final  . Glucose-Capillary 12/11/2016 179* 65 - 99 mg/dL Final  Appointment on 12/06/2016  Component Date Value Ref Range Status  . Glucose, Bld 12/06/2016 123  65 - 139 mg/dL Final   Comment: .        Non-fasting reference interval .   . BUN 12/06/2016 33* 7 - 25 mg/dL Final  . Creat 12/06/2016 2.88* 0.60 - 0.88 mg/dL Final   Comment: For  patients >82 years of age, the reference limit for Creatinine is approximately 13% higher for people identified as African-American. .   . GFR, Est Non African American 12/06/2016 14* > OR = 60 mL/min/1.92m Final  . GFR, Est African American 12/06/2016 16* > OR = 60 mL/min/1.789mFinal  . BUN/Creatinine Ratio 12/06/2016 11  6 - 22 (calc) Final  . Sodium 12/06/2016 139  135 - 146 mmol/L Final  . Potassium 12/06/2016 5.4* 3.5 - 5.3 mmol/L Final  . Chloride 12/06/2016 108  98 - 110 mmol/L Final  . CO2 12/06/2016 22  20 - 32 mmol/L Final  . Calcium 12/06/2016 8.5* 8.6 - 10.4 mg/dL Final  Office Visit on 11/29/2016  Component Date Value Ref Range Status  . Glucose, Bld 11/30/2016 190* 65 - 139 mg/dL Final   Comment: .        Non-fasting reference interval .   . BUN 11/30/2016 27* 7 - 25 mg/dL Final  . Creat 11/30/2016 2.30* 0.60 - 0.88 mg/dL Final   Comment: For patients >4969ears of age, the  reference limit for Creatinine is approximately 13% higher for people identified as African-American. .   . GFR, Est Non African American 11/30/2016 18* > OR = 60 mL/min/1.737minal  . GFR, Est African American 11/30/2016 21* > OR = 60 mL/min/1.49m66mnal  . BUN/Creatinine Ratio 11/30/2016 12  6 - 22 (calc) Final  . Sodium 11/30/2016 141  135 - 146 mmol/L Final  . Potassium 11/30/2016 5.7* 3.5 - 5.3 mmol/L Final   Specimen slightly hemolyzed.  . Chloride 11/30/2016 109  98 - 110 mmol/L Final  . CO2 11/30/2016 24  20 - 32 mmol/L Final  . Calcium 11/30/2016 8.7  8.6 - 10.4 mg/dL Final  Admission on 11/26/2016, Discharged on 11/26/2016  Component Date Value Ref Range Status  . Sodium 11/26/2016 143  135 - 145 mmol/L Final  . Potassium 11/26/2016 4.6  3.5 - 5.1 mmol/L Final  . Chloride 11/26/2016 111  101 - 111 mmol/L Final  . CO2 11/26/2016 23  22 - 32 mmol/L Final  . Glucose, Bld 11/26/2016 124* 65 - 99 mg/dL Final  . BUN 11/26/2016 45* 6 - 20 mg/dL Final  . Creatinine, Ser 11/26/2016 2.14* 0.44 - 1.00 mg/dL Final  . Calcium 11/26/2016 9.0  8.9 - 10.3 mg/dL Final  . Total Protein 11/26/2016 6.9  6.5 - 8.1 g/dL Final  . Albumin 11/26/2016 3.6  3.5 - 5.0 g/dL Final  . AST 11/26/2016 16  15 - 41 U/L Final  . ALT 11/26/2016 9* 14 - 54 U/L Final  . Alkaline Phosphatase 11/26/2016 70  38 - 126 U/L Final  . Total Bilirubin 11/26/2016 0.6  0.3 - 1.2 mg/dL Final  . GFR calc non Af Amer 11/26/2016 20* >60 mL/min Final  . GFR calc Af Amer 11/26/2016 23* >60 mL/min Final   Comment: (NOTE) The eGFR has been calculated using the CKD EPI equation. This calculation has not been validated in all clinical situations. eGFR's persistently <60 mL/min signify possible Chronic Kidney Disease.   . Anion gap 11/26/2016 9  5 - 15 Final  . WBC 11/26/2016 5.0  4.0 - 10.5 K/uL Final  . RBC 11/26/2016 4.00  3.87 - 5.11 MIL/uL Final  . Hemoglobin 11/26/2016 12.0  12.0 - 15.0 g/dL Final  . HCT 11/26/2016  37.0  36.0 - 46.0 % Final  . MCV 11/26/2016 92.5  78.0 - 100.0 fL Final  . MCH 11/26/2016 30.0  26.0 -  34.0 pg Final  . MCHC 11/26/2016 32.4  30.0 - 36.0 g/dL Final  . RDW 11/26/2016 14.1  11.5 - 15.5 % Final  . Platelets 11/26/2016 226  150 - 400 K/uL Final  . Neutrophils Relative % 11/26/2016 60  % Final  . Neutro Abs 11/26/2016 3.0  1.7 - 7.7 K/uL Final  . Lymphocytes Relative 11/26/2016 28  % Final  . Lymphs Abs 11/26/2016 1.4  0.7 - 4.0 K/uL Final  . Monocytes Relative 11/26/2016 10  % Final  . Monocytes Absolute 11/26/2016 0.5  0.1 - 1.0 K/uL Final  . Eosinophils Relative 11/26/2016 2  % Final  . Eosinophils Absolute 11/26/2016 0.1  0.0 - 0.7 K/uL Final  . Basophils Relative 11/26/2016 0  % Final  . Basophils Absolute 11/26/2016 0.0  0.0 - 0.1 K/uL Final  . Color, Urine 11/26/2016 YELLOW  YELLOW Final  . APPearance 11/26/2016 CLOUDY* CLEAR Final  . Specific Gravity, Urine 11/26/2016 1.011  1.005 - 1.030 Final  . pH 11/26/2016 6.0  5.0 - 8.0 Final  . Glucose, UA 11/26/2016 NEGATIVE  NEGATIVE mg/dL Final  . Hgb urine dipstick 11/26/2016 SMALL* NEGATIVE Final  . Bilirubin Urine 11/26/2016 NEGATIVE  NEGATIVE Final  . Ketones, ur 11/26/2016 NEGATIVE  NEGATIVE mg/dL Final  . Protein, ur 11/26/2016 100* NEGATIVE mg/dL Final  . Nitrite 11/26/2016 NEGATIVE  NEGATIVE Final  . Leukocytes, UA 11/26/2016 LARGE* NEGATIVE Final  . RBC / HPF 11/26/2016 0-5  0 - 5 RBC/hpf Final  . WBC, UA 11/26/2016 TOO NUMEROUS TO COUNT  0 - 5 WBC/hpf Final  . Bacteria, UA 11/26/2016 MANY* NONE SEEN Final  . Squamous Epithelial / LPF 11/26/2016 0-5* NONE SEEN Final  . WBC Clumps 11/26/2016 PRESENT   Final  . Lipase 11/26/2016 18  11 - 51 U/L Final  . Specimen Description 11/26/2016 URINE, RANDOM   Final  . Special Requests 11/26/2016 NONE   Final  . Culture 11/26/2016 >=100,000 COLONIES/mL KLEBSIELLA PNEUMONIAE*  Final  . Report Status 11/26/2016 11/29/2016 FINAL   Final  . Organism ID, Bacteria 11/26/2016  KLEBSIELLA PNEUMONIAE*  Final    No results found.   Assessment/Plan   ICD-10-CM   1. Rectal prolapse K62.3 Ambulatory referral to General Surgery  2. Hemorrhoids, unspecified hemorrhoid type K64.9 Ambulatory referral to General Surgery   Will call with surgery referral  Continue current medications as ordered  Follow up as scheduled in November or sooner if you need be  Will check labs next OV (bmp, a1c, lipid panel)   Kimiyo Carmicheal S. Perlie Gold  [facility] [address] [place], [place] [address] [phone] Cell (Monday-Friday 8 AM - 5 PM) [phone] After 5 PM and follow prompts

## 2017-01-16 ENCOUNTER — Other Ambulatory Visit: Payer: Self-pay | Admitting: Internal Medicine

## 2017-01-16 NOTE — Telephone Encounter (Addendum)
Cardio said she is high risk for any procedure because of her multiple co-morbidities but did not say that they would not clear her. She really needs to f/u with sx and cardiology for further tx options, if any. Cardio recommended she f/u with her sx as well

## 2017-01-17 NOTE — Telephone Encounter (Signed)
Spoke with [name]. [name] verbalized understanding of Dr.Weissman's recommendations.   [name] states that she will take her mom to surgery appointment for further recommendations. [name] states her mother is hell bent on having surgery and she knows she is not a good candidate.  [name] will call back if needed

## 2017-01-17 NOTE — Telephone Encounter (Signed)
Chrae, could you please contact her daughter for me? Thanks!

## 2017-01-18 ENCOUNTER — Other Ambulatory Visit: Payer: Self-pay | Admitting: Internal Medicine

## 2017-01-19 ENCOUNTER — Other Ambulatory Visit: Payer: Self-pay | Admitting: *Deleted

## 2017-01-19 NOTE — Patient Outreach (Signed)
[facility]) Care Management  01/19/2017  Andria RheinDoris E Baetz [birth year] [id number]   CSW spoke with patient briefly by phone today. She states, "I'm deaf" and requests I call her daughter. CSW left HIPPA compliant voicemail for daughter, Arna Mediciora. CSW will try again in 5-7 days if no return call from family.   Reece LevyJanet Ife Vitelli, MSW, LCSW Clinical Social Worker  [facility] [phone]

## 2017-01-25 ENCOUNTER — Other Ambulatory Visit: Payer: Self-pay | Admitting: *Deleted

## 2017-01-26 NOTE — Patient Outreach (Signed)
[facility]) Care Management  [facility] Social Work  01/26/2017  [name] 1927-11-20 [id number]  Subjective:  "mom wants her friend Rush Landmark to leave so I am going to have to hire a lawyer". Objective: [facility] CSW to assist patient and family with community based resources to aide in their well-being, quality of life and overall safety and needs.    Current Medications:  Current Outpatient Medications  Medication Sig Dispense Refill  . aspirin EC 81 MG tablet Take 1 tablet (81 mg total) by mouth daily. 30 tablet 0  . BYSTOLIC 20 MG TABS TAKE 1 TABLET (20 MG TOTAL) BY MOUTH DAILY. FOR BLOOD PRESSURE 30 tablet 2  . cloNIDine (CATAPRES) 0.1 MG tablet Take 1 tablet (0.1 mg total) by mouth daily. For high blood pressure 30 tablet 3  . docusate sodium (COLACE) 100 MG capsule Take 1 capsule (100 mg total) by mouth every 12 (twelve) hours. 60 capsule 0  . ezetimibe-simvastatin (VYTORIN) 10-20 MG tablet TAKE 1/2 TABLET BY MOUTH ONCE NIGHTLY TO CONTROL CHOLESTEROL 15 tablet 2  . feeding supplement, ENSURE ENLIVE, (ENSURE ENLIVE) LIQD Take 237 mLs by mouth 2 (two) times daily between meals. 237 mL 12  . furosemide (LASIX) 20 MG tablet TAKE 1 TABLET BY MOUTH TWICE DAILY FOR HYPERTENSION 60 tablet 2  . JANUVIA 25 MG tablet TAKE ONE TABLET BY MOUTH DAILY 30 tablet 2  . Loteprednol Etabonate (LOTEMAX) 0.5 % GEL Place 1 drop into the left eye daily. PLACE 1 DROP IN THE LEFT EYE TWICE DAILY 5 g 0  . nitroGLYCERIN (NITROSTAT) 0.4 MG SL tablet Place one under tongue for chest pain, up to 3 tablets 30 tablet 3  . timolol (BETIMOL) 0.5 % ophthalmic solution INSTILL 1 DROP INTO THE LEFT EYE DAILY 10 mL 1   No current facility-administered medications for this visit.     Functional Status:  In your present state of health, do you have any difficulty performing the following activities: 12/09/2016 11/17/2016  Hearing? [place]? N -  Difficulty concentrating or making decisions? Y  -  Comment - -  Walking or climbing stairs? Y -  Dressing or bathing? Y -  Doing errands, shopping? [place] and eating ? - Y  Using the Toilet? - N  In the past six months, have you accidently leaked urine? - N  Comment - -  Do you have problems with loss of bowel control? - N  Comment - -  Managing your Medications? - N  Managing your Finances? - N  Housekeeping or managing your Housekeeping? - N  Some recent data might be hidden    Fall/Depression Screening:  Fall Risk  01/13/2017 01/05/2017 12/22/2016  Falls in the past year? No No No   PHQ 2/9 Scores 11/17/2016 11/03/2016 10/24/2016 04/20/2016 02/19/2016 06/24/2015 12/04/2014  PHQ - 2 Score 0 0 0 0 0 0 0    Assessment:  CSW was able to make contact with daughter at patient's home who reports her mothers friend/housemate, Rush Landmark, has moved back in but her mother wants him to leave and he won't.  CSW encouraged daughter to seek assistance through an Attorney with this dilemma as well as for HCPOA and Durable POA documents to be discussed for possible completion. CSW offered to check back in 1-2 weeks for update and to talk about case closure vs- goal setting.    Plan:   [facility] CM  Care Plan Problem One     Most Recent Value  Care Plan Problem One  Patient can benefit from community resource support in the home due to cognition, physical and financial needs.  Role Documenting the Problem One  Clinical Social Worker  Care Plan for Problem One  Active  [facility] CM Short Term Goal #1   Patient and family will be linked with community resources to review and consider in the next 30 days.  [facility] CM Short Term Goal #1 Start Date  11/16/16  [facility] CM Short Term Goal #1 Met Date  12/16/16  Interventions for Short Term Goal #1  CSW discussed and will provide resources to consider .  [facility] CM Short Term Goal #2   Patient and family will report progress in completion of Adv. DIirectives in the next 30 days.  [facility] CM Short Term Goal #2 Start  Date  11/16/16  [facility] CM Short Term Goal #2 Met Date  12/16/16  Interventions for Short Term Goal #2  CSW educated and provided Advance Directive packet for completion.     Eduard Clos, MSW, [place] Worker  [place] [phone]

## 2017-01-30 NOTE — Patient Outreach (Addendum)
[facility]) Care Management  [facility] Social Work  01/30/2017-  ENSLEY BLAS 04/18/1927 [id number]  Subjective:  "Rush Landmark is back and she wants him to leave".  Objective: [facility] CSW to assist patient and family with community based resources to aide in their well-being, quality of life and overall safety and needs.    Encounter Medications:  Outpatient Encounter Medications as of 01/25/2017  Medication Sig  . aspirin EC 81 MG tablet Take 1 tablet (81 mg total) by mouth daily.  Marland Kitchen BYSTOLIC 20 MG TABS TAKE 1 TABLET (20 MG TOTAL) BY MOUTH DAILY. FOR BLOOD PRESSURE  . cloNIDine (CATAPRES) 0.1 MG tablet Take 1 tablet (0.1 mg total) by mouth daily. For high blood pressure  . docusate sodium (COLACE) 100 MG capsule Take 1 capsule (100 mg total) by mouth every 12 (twelve) hours.  Marland Kitchen ezetimibe-simvastatin (VYTORIN) 10-20 MG tablet TAKE 1/2 TABLET BY MOUTH ONCE NIGHTLY TO CONTROL CHOLESTEROL  . feeding supplement, ENSURE ENLIVE, (ENSURE ENLIVE) LIQD Take 237 mLs by mouth 2 (two) times daily between meals.  . furosemide (LASIX) 20 MG tablet TAKE 1 TABLET BY MOUTH TWICE DAILY FOR HYPERTENSION  . JANUVIA 25 MG tablet TAKE ONE TABLET BY MOUTH DAILY  . Loteprednol Etabonate (LOTEMAX) 0.5 % GEL Place 1 drop into the left eye daily. PLACE 1 DROP IN THE LEFT EYE TWICE DAILY  . nitroGLYCERIN (NITROSTAT) 0.4 MG SL tablet Place one under tongue for chest pain, up to 3 tablets  . timolol (BETIMOL) 0.5 % ophthalmic solution INSTILL 1 DROP INTO THE LEFT EYE DAILY   No facility-administered encounter medications on file as of 01/25/2017.     Functional Status:  In your present state of health, do you have any difficulty performing the following activities: 12/09/2016 11/17/2016  Hearing? [place]? N -  Difficulty concentrating or making decisions? Y -  Comment - -  Walking or climbing stairs? Y -  Dressing or bathing? Y -  Doing errands, shopping? [place] and eating ? - Y  Using the Toilet? - N  In the past six months, have you accidently leaked urine? - N  Comment - -  Do you have problems with loss of bowel control? - N  Comment - -  Managing your Medications? - N  Managing your Finances? - N  Housekeeping or managing your Housekeeping? - N  Some recent data might be hidden    Fall/Depression Screening:  PHQ 2/9 Scores 11/17/2016 11/03/2016 10/24/2016 04/20/2016 02/19/2016 06/24/2015 12/04/2014  PHQ - 2 Score 0 0 0 0 0 0 0    Assessment: CSW spoke with patient's daughter today by phone for follow up regarding last weeks concerns of her reports her mother wants Rush Landmark who has been her housemate off and on for years, to leave and he won't. " I am going to have to get her an attorney", daughter [name] states he refuses to leave and has rights because of paying bills, etc.  Daughter reports she has the resources provided by CSW and will reach out as needed. CSW also discussed Elder Training and development officer support or DSS may be of help.  She remains supportive and appreciative of help offered for her mother and continues to deny any further needs from [place]. CSW discussed plans for case closure and she  is aware and reminded to reach out to [place] or PCP if further [facility] needs arise.  Plan:  CSW plans to close  CSW referral at this time and will advise PCP and [facility] team.   [facility] CM Care Plan Problem One     Most Recent Value  Care Plan Problem One  Patient can benefit from community resource support in the home due to cognition, physical and financial needs.  Role Documenting the Problem One  Clinical Social Worker  Care Plan for Problem One  Active  [facility] CM Short Term Goal #1   Patient and family will be linked with community resources to review and consider in the next 30 days.  [facility] CM Short Term Goal #1 Start Date  11/16/16  [facility] CM Short Term Goal #1 Met Date  12/16/16  Interventions for Short Term Goal #1  CSW discussed and will provide resources to consider .   [facility] CM Short Term Goal #2   Patient and family will report progress in completion of Adv. DIirectives in the next 30 days.  [facility] CM Short Term Goal #2 Start Date  11/16/16  [facility] CM Short Term Goal #2 Met Date  12/16/16  Interventions for Short Term Goal #2  CSW educated and provided Advance Directive packet for completion.        Eduard Clos, MSW, [place] Worker  [place] [phone]

## 2017-02-01 ENCOUNTER — Ambulatory Visit: Payer: Medicare Other | Admitting: Internal Medicine

## 2017-02-01 ENCOUNTER — Emergency Department (HOSPITAL_COMMUNITY)
Admission: EM | Admit: 2017-02-01 | Discharge: 2017-02-01 | Disposition: A | Discharge status: Home / Self Care | Payer: Medicare Other | Source: Home / Self Care | Attending: Emergency Medicine | Admitting: Emergency Medicine

## 2017-02-01 ENCOUNTER — Emergency Department (HOSPITAL_COMMUNITY): Payer: Medicare Other

## 2017-02-01 ENCOUNTER — Encounter (HOSPITAL_COMMUNITY): Payer: Self-pay | Admitting: Emergency Medicine

## 2017-02-01 DIAGNOSIS — I5032 Chronic diastolic (congestive) heart failure: Secondary | ICD-10-CM

## 2017-02-01 DIAGNOSIS — Z79899 Other long term (current) drug therapy: Secondary | ICD-10-CM

## 2017-02-01 DIAGNOSIS — Z87891 Personal history of nicotine dependence: Secondary | ICD-10-CM | POA: Insufficient documentation

## 2017-02-01 DIAGNOSIS — R339 Retention of urine, unspecified: Secondary | ICD-10-CM | POA: Insufficient documentation

## 2017-02-01 DIAGNOSIS — I2510 Atherosclerotic heart disease of native coronary artery without angina pectoris: Secondary | ICD-10-CM | POA: Insufficient documentation

## 2017-02-01 DIAGNOSIS — E1122 Type 2 diabetes mellitus with diabetic chronic kidney disease: Secondary | ICD-10-CM | POA: Insufficient documentation

## 2017-02-01 DIAGNOSIS — N183 Chronic kidney disease, stage 3 (moderate): Secondary | ICD-10-CM | POA: Insufficient documentation

## 2017-02-01 DIAGNOSIS — I130 Hypertensive heart and chronic kidney disease with heart failure and stage 1 through stage 4 chronic kidney disease, or unspecified chronic kidney disease: Secondary | ICD-10-CM | POA: Insufficient documentation

## 2017-02-01 DIAGNOSIS — Z7984 Long term (current) use of oral hypoglycemic drugs: Secondary | ICD-10-CM

## 2017-02-01 DIAGNOSIS — F0390 Unspecified dementia without behavioral disturbance: Secondary | ICD-10-CM

## 2017-02-01 DIAGNOSIS — Z955 Presence of coronary angioplasty implant and graft: Secondary | ICD-10-CM | POA: Insufficient documentation

## 2017-02-01 DIAGNOSIS — R338 Other retention of urine: Secondary | ICD-10-CM

## 2017-02-01 DIAGNOSIS — Z7982 Long term (current) use of aspirin: Secondary | ICD-10-CM

## 2017-02-01 LAB — CBC
HEMATOCRIT: 34.2 % — AB (ref 36.0–46.0)
HEMOGLOBIN: 10.8 g/dL — AB (ref 12.0–15.0)
MCH: 29.5 pg (ref 26.0–34.0)
MCHC: 31.6 g/dL (ref 30.0–36.0)
MCV: 93.4 fL (ref 78.0–100.0)
Platelets: 200 10*3/uL (ref 150–400)
RBC: 3.66 MIL/uL — ABNORMAL LOW (ref 3.87–5.11)
RDW: 14 % (ref 11.5–15.5)
WBC: 6 10*3/uL (ref 4.0–10.5)

## 2017-02-01 LAB — URINALYSIS, ROUTINE W REFLEX MICROSCOPIC
BACTERIA UA: NONE SEEN
BILIRUBIN URINE: NEGATIVE
Glucose, UA: NEGATIVE mg/dL
Hgb urine dipstick: NEGATIVE
Ketones, ur: NEGATIVE mg/dL
LEUKOCYTES UA: NEGATIVE
Nitrite: NEGATIVE
PH: 6 (ref 5.0–8.0)
Protein, ur: 100 mg/dL — AB
SPECIFIC GRAVITY, URINE: 1.011 (ref 1.005–1.030)

## 2017-02-01 LAB — COMPREHENSIVE METABOLIC PANEL
ALBUMIN: 3.3 g/dL — AB (ref 3.5–5.0)
ALK PHOS: 59 U/L (ref 38–126)
ALT: 9 U/L — ABNORMAL LOW (ref 14–54)
ANION GAP: 6 (ref 5–15)
AST: 15 U/L (ref 15–41)
BILIRUBIN TOTAL: 0.5 mg/dL (ref 0.3–1.2)
BUN: 28 mg/dL — ABNORMAL HIGH (ref 6–20)
CALCIUM: 8.6 mg/dL — AB (ref 8.9–10.3)
CO2: 26 mmol/L (ref 22–32)
Chloride: 109 mmol/L (ref 101–111)
Creatinine, Ser: 1.84 mg/dL — ABNORMAL HIGH (ref 0.44–1.00)
GFR calc Af Amer: 27 mL/min — ABNORMAL LOW (ref >60)
GFR calc non Af Amer: 24 mL/min — ABNORMAL LOW (ref >60)
GLUCOSE: 98 mg/dL (ref 65–99)
POTASSIUM: 3.9 mmol/L (ref 3.5–5.1)
SODIUM: 141 mmol/L (ref 135–145)
TOTAL PROTEIN: 6.1 g/dL — AB (ref 6.5–8.1)

## 2017-02-01 MED ORDER — CLONIDINE HCL 0.1 MG PO TABS
0.1000 mg | ORAL_TABLET | Freq: Once | ORAL | Status: AC
  Administered 2017-02-01: 0.1 mg via ORAL
  Filled 2017-02-01: qty 1

## 2017-02-01 MED ORDER — LIDOCAINE HCL 2 % EX GEL
1.0000 "application " | Freq: Once | CUTANEOUS | Status: AC
  Administered 2017-02-01: 1 via TOPICAL
  Filled 2017-02-01: qty 11

## 2017-02-01 MED ORDER — HYDROCORTISONE 2.5 % RE CREA: 1.0000 "application " | TOPICAL_CREAM | Freq: Two times a day (BID) | RECTAL | 0 refills | Status: DC

## 2017-02-01 NOTE — ED Triage Notes (Signed)
Per [facility] pt from home c/o rectum pain x 3 days. Patient has been seen for same. Denies bleeding. Not orientated to year, pt reports that President Trump is her best friend.

## 2017-02-01 NOTE — ED Provider Notes (Signed)
[name] Provider Note   CSN: [id number] Arrival date & time: 02/01/17  1739     History   Chief Complaint Chief Complaint  Patient presents with  . Rectal Pain    HPI [name] is a 83 y.o. female with a history of a rectal prolapse, constipation, and acute urinary retention associated with UTI who presents to the emergency department with a chief complaint of rectal pain that has worsened over the last 3 days.  She denies bright red blood per rectum, diarrhea, melena.  She also states that she has not voided since yesterday.  No treatment prior to arrival.  During her interview, she keeps repeating "I have to pee. I have to pee."  Patient's daughter reports that the patient's mental status is currently at her baseline.  She reports a history of delusional disorder that was progressing in a stepwise fashion until the last few months when it began progressing much more quickly.  She is followed by primary care for these concerns.  Patient's daughter voices no other medical complaints that the patient was expressed at this time.  Level 5 caveat due to delusional disorder.   The history is provided by the patient and a relative. The history is limited by the condition of the patient. No language interpreter was used.    Past Medical History:  Diagnosis Date  . Anemia   . Anxiety disorder   . Carotid artery occlusion   . Carotid bruit   . Chronic kidney disease (CKD), stage III (moderate) (HCC)   . Coronary artery disease    Remote PCI. Had BMS to RCA and DES stent to Diagonal in Jan. 2012   . Diastolic heart failure    EF 55 to 60% per cath in Jan 2012  . DVT (deep venous thrombosis) (HCC)   . Gout   . Hyperlipidemia   . Hypertension   . Ischemic cardiomyopathy    with prior EF of 35%  . NSTEMI (non-ST elevated myocardial infarction) [facility](HCC) Jan 2012   with PCI to RCA and DX per Dr. Excell Seltzerooper  . Obesity   . Pneumonia 2016  .  Proteinuria   . Stroke [facility](HCC) 2013   ?  mini    . TIA (transient ischemic attack)   . Type II diabetes mellitus (HCC)    long standing    Patient Active Problem List   Diagnosis Date Noted  . Diarrhea 12/09/2016  . Delusions (HCC) 09/28/2016  . Medication noncompliance due to cognitive impairment 09/28/2016  . Hemorrhoids 05/04/2016  . Pain in joint, lower leg 01/20/2016  . Ptosis 11/04/2015  . Glaucoma 08/25/2015  . Dementia without behavioral disturbance 06/10/2015  . Expressive aphasia 05/26/2015  . Anal prolapse 02/25/2015  . Chronic kidney disease, stage III (moderate) (HCC) 02/10/2014  . PAD (peripheral artery disease) (HCC) 01/17/2014  . Constipation 07/31/2013  . Atherosclerosis of native artery of extremity with intermittent claudication (HCC) 07/04/2013  . Anxiety disorder 02/21/2013  . Diabetes mellitus with nephropathy (HCC) 07/05/2012  . Occlusion and stenosis of carotid artery without mention of cerebral infarction 06/21/2012  . Coronary artery disease   . Chronic diastolic heart failure, NYHA class 1 (HCC)   . Hyperlipidemia   . Hypertension   . Carotid bruit   . Gout   . Obesity   . TIA (transient ischemic attack)     Past Surgical History:  Procedure Laterality Date  . ANKLE FUSION Bilateral   . APPENDECTOMY    .  CATARACT EXTRACTION, BILATERAL Bilateral   . CORONARY ANGIOPLASTY WITH STENT PLACEMENT  04/05/2010   distal RCA  . FRACTURE SURGERY Right 2005   bilateral ankles   . TONSILLECTOMY      OB History    No data available       Home Medications    Prior to Admission medications   Medication Sig Start Date End Date Taking? Authorizing Provider  aspirin EC 81 MG tablet Take 1 tablet (81 mg total) by mouth daily. 12/11/16  Yes Albertine Grates, MD  BYSTOLIC 20 MG TABS TAKE 1 TABLET (20 MG TOTAL) BY MOUTH DAILY. FOR BLOOD PRESSURE 12/26/16  Yes Sharon Seller, NP  cloNIDine (CATAPRES) 0.1 MG tablet Take 1 tablet (0.1 mg total) by mouth daily. For  high blood pressure 09/28/16  Yes Montez Morita, Monica, DO  docusate sodium (COLACE) 100 MG capsule Take 1 capsule (100 mg total) by mouth every 12 (twelve) hours. 01/09/17  Yes Ward, Layla Maw, DO  ezetimibe-simvastatin (VYTORIN) 10-20 MG tablet TAKE 1/2 TABLET BY MOUTH ONCE NIGHTLY TO CONTROL CHOLESTEROL 01/19/17  Yes Montez Morita, [place], DO  feeding supplement, ENSURE ENLIVE, (ENSURE ENLIVE) LIQD Take 237 mLs by mouth 2 (two) times daily between meals. 12/11/16  Yes Albertine Grates, MD  furosemide (LASIX) 20 MG tablet TAKE 1 TABLET BY MOUTH TWICE DAILY FOR HYPERTENSION 01/16/17  Yes Montez Morita, [place], DO  JANUVIA 25 MG tablet TAKE ONE TABLET BY MOUTH DAILY 01/16/17  Yes Montez Morita, Monica, DO  Loteprednol Etabonate (LOTEMAX) 0.5 % GEL Place 1 drop into the left eye daily. PLACE 1 DROP IN THE LEFT EYE TWICE DAILY 01/19/17  Yes Montez Morita, Monica, DO  timolol (BETIMOL) 0.5 % ophthalmic solution INSTILL 1 DROP INTO THE LEFT EYE DAILY 09/28/16  Yes Kirt Boys, DO  hydrocortisone (ANUSOL-HC) 2.5 % rectal cream Place 1 application 2 (two) times daily rectally. 02/01/17   McDonald, Pedro Earls A, PA-C  JANUVIA 100 MG tablet  11/15/16   [provider]  losartan (COZAAR) 50 MG tablet  11/15/16   [provider]  meloxicam (MOBIC) 15 MG tablet  01/16/17   [provider]  nitroGLYCERIN (NITROSTAT) 0.4 MG SL tablet Place one under tongue for chest pain, up to 3 tablets 09/28/16   Kirt Boys, DO  polyethylene glycol powder [facility]) powder  01/16/17   [provider]  PROCTOZONE-HC 2.5 % rectal cream  01/16/17   [provider]  SPS 15 GM/60ML suspension  12/08/16   [provider]    Family History No family history on file.  Social History Social History   Tobacco Use  . Smoking status: Former Games developer  . Smokeless tobacco: Never Used  . Tobacco comment: "smoked in my teens; quit after 1 year or so"  Substance Use Topics  . Alcohol use: No    Alcohol/week: 0.0 oz  .  Drug use: No     Allergies   Namzaric [memantine hcl-donepezil hcl]; Percocet [oxycodone-acetaminophen]; Sulfa antibiotics; Adhesive [tape]; Metformin and related; Tradjenta [linagliptin]; and Penicillins   Review of Systems Review of Systems  Constitutional: Negative for activity change.  Respiratory: Negative for shortness of breath.   Cardiovascular: Negative for chest pain.  Gastrointestinal: Positive for rectal pain. Negative for abdominal distention, abdominal pain, anal bleeding, blood in stool and diarrhea.  Genitourinary: Positive for difficulty urinating. Negative for dysuria.  Musculoskeletal: Negative for back pain.  Skin: Negative for rash.     Physical Exam Updated Vital Signs BP (!) 177/110 (BP Location: Left Arm)  Pulse 71   Temp 97.8 F (36.6 C) (Oral)   Resp 18   SpO2 100%   Physical Exam  Constitutional: No distress.  HENT:  Head: Normocephalic.  Eyes: Conjunctivae are normal.  Neck: Neck supple.  Cardiovascular: Normal rate and regular rhythm. Exam reveals no gallop and no friction rub.  No murmur heard. Pulmonary/Chest: Effort normal and breath sounds normal. No stridor. No respiratory distress. She has no wheezes. She has no rales.  Abdominal: Soft. Bowel sounds are normal. She exhibits no distension. There is no tenderness. There is no rebound and no guarding.  Genitourinary:  Genitourinary Comments: Erythema noted in the peri-rectal area.  No edema.  No skin tears, fistulas, or sinus tracts.  No rectal prolapse noted.  Musculoskeletal: She exhibits no edema or deformity.  Neurological: She is alert.  Skin: Skin is warm. No rash noted.  Psychiatric: Her behavior is normal.  Nursing note and vitals reviewed.    ED Treatments / Results  Labs (all labs ordered are listed, but only abnormal results are displayed) Labs Reviewed  URINALYSIS, ROUTINE W REFLEX MICROSCOPIC - Abnormal; Notable for the following components:      Result Value    Protein, ur 100 (*)    Squamous Epithelial / LPF 0-5 (*)    All other components within normal limits  COMPREHENSIVE METABOLIC PANEL - Abnormal; Notable for the following components:   BUN 28 (*)    Creatinine, Ser 1.84 (*)    Calcium 8.6 (*)    Total Protein 6.1 (*)    Albumin 3.3 (*)    ALT 9 (*)    GFR calc non Af Amer 24 (*)    GFR calc Af Amer 27 (*)    All other components within normal limits  CBC - Abnormal; Notable for the following components:   RBC 3.66 (*)    Hemoglobin 10.8 (*)    HCT 34.2 (*)    All other components within normal limits    EKG  EKG Interpretation None       Radiology Dg Abdomen 1 View  Result Date: 02/01/2017 CLINICAL DATA:  Rectal pain and abdominal pain EXAM: ABDOMEN - 1 VIEW COMPARISON:  12/09/2016, 04/02/2015, 11/26/2016 FINDINGS: Prominent right infrahilar pulmonary vessel. Borderline cardiomegaly. Streaky atelectasis or scar at the left base. Nonobstructed gas pattern. The iliac stents. Round calcification in the pelvis as before, corresponding to calcified fibroid on CT. IMPRESSION: Nonobstructed gas pattern Electronically Signed   By: Jasmine Pang M.D.   On: 02/01/2017 20:57    Procedures Procedures (including critical care time)  Medications Ordered in ED Medications  cloNIDine (CATAPRES) tablet 0.1 mg (0.1 mg Oral Given 02/01/17 2336)  lidocaine (XYLOCAINE) 2 % jelly 1 application (1 application Topical Given 02/01/17 2349)     Initial Impression / Assessment and Plan / ED Course  I have reviewed the triage vital signs and the nursing notes.  Pertinent labs & imaging results that were available during my care of the patient were reviewed by me and considered in my medical decision making (see chart for details).     83 year old female with a history of rectal prolapse, constipation, and acute urinary retention secondary to UTI presenting with rectal pain and urinary retention.  The patient was discussed with Dr. Rhunette Croft,  attending physician.  ~300 mLs on bladder scan.  On physical exam, the patient has erythema around the inferior portion of the buttocks, but no warmth or edema.  No rectal prolapse noted.  KUB  negative for constipation or bowel obstruction. UA is unremarkable except for mild proteinuria, which is the patient's baseline.  No concern for infection.  Foley catheter placed for urinary retention.  Review of the patient's medications revealed no medications concerning for acute urinary retention.  CBC and CMP are consistent with the patient's baseline, including creatinine of 1.84.  Doubt UTI, pyelonephritis, small or large bowel obstruction, or CHF exacerbation.  On the monitor, the patient's blood pressure is 177/110.  She reports she has not taken her dose of clonidine today.  We will give her a dose in the ED lidocaine jelly ordered to be applied topically over the perirectal area.  The patient's daughter reports that she has a follow-up appointment tomorrow morning with urology.  We will send the patient home with Foley catheter in place to be evaluated tomorrow by urology.  Strict return precautions given.  No acute distress.  Patient is safe for discharge at this time.  Final Clinical Impressions(s) / ED Diagnoses   Final diagnoses:  Acute urinary retention    ED Discharge Orders        Ordered    hydrocortisone (ANUSOL-HC) 2.5 % rectal cream  2 times daily     02/01/17 2321       McDonald, Mia A, PA-C 02/02/17 0125    Derwood KaplanNanavati, Ankit, MD 02/02/17 1643

## 2017-02-01 NOTE — Discharge Instructions (Signed)
Please keep the appointment tomorrow with your urologist.  Please do not remove the Foley catheter until you are seen at your appointment.  Please make sure to have your blood pressure rechecked at your appointment tomorrow.  It was elevated at night, but this is most likely due to the fact that you have not taken her daily dose of clonidine.  Anusol cream can be applied to areas that are red around the rectum up to 2 times daily.  If you develop new or worsening symptoms, including bright red blood in your stool, fever, chest pain, or burning with urination, please return to the emergency department for reevaluation.

## 2017-02-04 ENCOUNTER — Other Ambulatory Visit: Payer: Self-pay

## 2017-02-04 ENCOUNTER — Encounter (HOSPITAL_COMMUNITY): Payer: Self-pay

## 2017-02-04 ENCOUNTER — Emergency Department (HOSPITAL_COMMUNITY): Payer: Medicare Other

## 2017-02-04 ENCOUNTER — Inpatient Hospital Stay (HOSPITAL_COMMUNITY)
Admission: EM | Admit: 2017-02-04 | Discharge: 2017-02-09 | DRG: 291 | Disposition: A | Discharge status: Home / Self Care | Payer: Medicare Other | Attending: Internal Medicine | Admitting: Internal Medicine

## 2017-02-04 DIAGNOSIS — I5033 Acute on chronic diastolic (congestive) heart failure: Secondary | ICD-10-CM | POA: Diagnosis not present

## 2017-02-04 DIAGNOSIS — Z6824 Body mass index (BMI) 24.0-24.9, adult: Secondary | ICD-10-CM

## 2017-02-04 DIAGNOSIS — I340 Nonrheumatic mitral (valve) insufficiency: Secondary | ICD-10-CM | POA: Diagnosis not present

## 2017-02-04 DIAGNOSIS — I739 Peripheral vascular disease, unspecified: Secondary | ICD-10-CM

## 2017-02-04 DIAGNOSIS — I130 Hypertensive heart and chronic kidney disease with heart failure and stage 1 through stage 4 chronic kidney disease, or unspecified chronic kidney disease: Principal | ICD-10-CM | POA: Diagnosis present

## 2017-02-04 DIAGNOSIS — E1151 Type 2 diabetes mellitus with diabetic peripheral angiopathy without gangrene: Secondary | ICD-10-CM | POA: Diagnosis present

## 2017-02-04 DIAGNOSIS — I10 Essential (primary) hypertension: Secondary | ICD-10-CM | POA: Diagnosis not present

## 2017-02-04 DIAGNOSIS — Z8673 Personal history of transient ischemic attack (TIA), and cerebral infarction without residual deficits: Secondary | ICD-10-CM

## 2017-02-04 DIAGNOSIS — Z86718 Personal history of other venous thrombosis and embolism: Secondary | ICD-10-CM

## 2017-02-04 DIAGNOSIS — I2510 Atherosclerotic heart disease of native coronary artery without angina pectoris: Secondary | ICD-10-CM | POA: Diagnosis present

## 2017-02-04 DIAGNOSIS — E1122 Type 2 diabetes mellitus with diabetic chronic kidney disease: Secondary | ICD-10-CM | POA: Diagnosis present

## 2017-02-04 DIAGNOSIS — Z888 Allergy status to other drugs, medicaments and biological substances status: Secondary | ICD-10-CM

## 2017-02-04 DIAGNOSIS — I2583 Coronary atherosclerosis due to lipid rich plaque: Secondary | ICD-10-CM | POA: Diagnosis not present

## 2017-02-04 DIAGNOSIS — N390 Urinary tract infection, site not specified: Secondary | ICD-10-CM | POA: Diagnosis present

## 2017-02-04 DIAGNOSIS — N179 Acute kidney failure, unspecified: Secondary | ICD-10-CM | POA: Diagnosis present

## 2017-02-04 DIAGNOSIS — N184 Chronic kidney disease, stage 4 (severe): Secondary | ICD-10-CM | POA: Diagnosis present

## 2017-02-04 DIAGNOSIS — R197 Diarrhea, unspecified: Secondary | ICD-10-CM | POA: Diagnosis present

## 2017-02-04 DIAGNOSIS — R05 Cough: Secondary | ICD-10-CM | POA: Diagnosis present

## 2017-02-04 DIAGNOSIS — F0390 Unspecified dementia without behavioral disturbance: Secondary | ICD-10-CM | POA: Diagnosis present

## 2017-02-04 DIAGNOSIS — R9389 Abnormal findings on diagnostic imaging of other specified body structures: Secondary | ICD-10-CM | POA: Diagnosis not present

## 2017-02-04 DIAGNOSIS — E440 Moderate protein-calorie malnutrition: Secondary | ICD-10-CM | POA: Diagnosis present

## 2017-02-04 DIAGNOSIS — K649 Unspecified hemorrhoids: Secondary | ICD-10-CM | POA: Diagnosis present

## 2017-02-04 DIAGNOSIS — Z87891 Personal history of nicotine dependence: Secondary | ICD-10-CM | POA: Diagnosis not present

## 2017-02-04 DIAGNOSIS — E785 Hyperlipidemia, unspecified: Secondary | ICD-10-CM | POA: Diagnosis present

## 2017-02-04 DIAGNOSIS — R682 Dry mouth, unspecified: Secondary | ICD-10-CM | POA: Diagnosis present

## 2017-02-04 DIAGNOSIS — K5900 Constipation, unspecified: Secondary | ICD-10-CM | POA: Diagnosis present

## 2017-02-04 DIAGNOSIS — Z79899 Other long term (current) drug therapy: Secondary | ICD-10-CM

## 2017-02-04 DIAGNOSIS — Z7982 Long term (current) use of aspirin: Secondary | ICD-10-CM

## 2017-02-04 DIAGNOSIS — R339 Retention of urine, unspecified: Secondary | ICD-10-CM | POA: Diagnosis not present

## 2017-02-04 DIAGNOSIS — Z882 Allergy status to sulfonamides status: Secondary | ICD-10-CM

## 2017-02-04 DIAGNOSIS — R0902 Hypoxemia: Secondary | ICD-10-CM | POA: Diagnosis present

## 2017-02-04 DIAGNOSIS — K623 Rectal prolapse: Secondary | ICD-10-CM | POA: Diagnosis present

## 2017-02-04 DIAGNOSIS — N189 Chronic kidney disease, unspecified: Secondary | ICD-10-CM

## 2017-02-04 DIAGNOSIS — Z955 Presence of coronary angioplasty implant and graft: Secondary | ICD-10-CM

## 2017-02-04 DIAGNOSIS — E1121 Type 2 diabetes mellitus with diabetic nephropathy: Secondary | ICD-10-CM | POA: Diagnosis present

## 2017-02-04 DIAGNOSIS — I5043 Acute on chronic combined systolic (congestive) and diastolic (congestive) heart failure: Secondary | ICD-10-CM | POA: Diagnosis present

## 2017-02-04 DIAGNOSIS — I447 Left bundle-branch block, unspecified: Secondary | ICD-10-CM | POA: Diagnosis present

## 2017-02-04 DIAGNOSIS — Z91048 Other nonmedicinal substance allergy status: Secondary | ICD-10-CM

## 2017-02-04 LAB — CBC
HEMATOCRIT: 33.2 % — AB (ref 36.0–46.0)
Hemoglobin: 10.7 g/dL — ABNORMAL LOW (ref 12.0–15.0)
MCH: 30 pg (ref 26.0–34.0)
MCHC: 32.2 g/dL (ref 30.0–36.0)
MCV: 93 fL (ref 78.0–100.0)
Platelets: 208 10*3/uL (ref 150–400)
RBC: 3.57 MIL/uL — ABNORMAL LOW (ref 3.87–5.11)
RDW: 13.9 % (ref 11.5–15.5)
WBC: 7.9 10*3/uL (ref 4.0–10.5)

## 2017-02-04 LAB — URINALYSIS, ROUTINE W REFLEX MICROSCOPIC
Bilirubin Urine: NEGATIVE
GLUCOSE, UA: NEGATIVE mg/dL
KETONES UR: NEGATIVE mg/dL
Nitrite: NEGATIVE
PROTEIN: 100 mg/dL — AB
Specific Gravity, Urine: 1.008 (ref 1.005–1.030)
Squamous Epithelial / LPF: NONE SEEN
pH: 5 (ref 5.0–8.0)

## 2017-02-04 LAB — COMPREHENSIVE METABOLIC PANEL
ALT: 7 U/L — AB (ref 14–54)
AST: 13 U/L — AB (ref 15–41)
Albumin: 3.6 g/dL (ref 3.5–5.0)
Alkaline Phosphatase: 59 U/L (ref 38–126)
Anion gap: 5 (ref 5–15)
BUN: 32 mg/dL — AB (ref 6–20)
CHLORIDE: 111 mmol/L (ref 101–111)
CO2: 27 mmol/L (ref 22–32)
Calcium: 8.7 mg/dL — ABNORMAL LOW (ref 8.9–10.3)
Creatinine, Ser: 2.05 mg/dL — ABNORMAL HIGH (ref 0.44–1.00)
GFR calc non Af Amer: 21 mL/min — ABNORMAL LOW (ref >60)
GFR, EST AFRICAN AMERICAN: 24 mL/min — AB (ref >60)
GLUCOSE: 104 mg/dL — AB (ref 65–99)
POTASSIUM: 4.7 mmol/L (ref 3.5–5.1)
SODIUM: 143 mmol/L (ref 135–145)
Total Bilirubin: 0.5 mg/dL (ref 0.3–1.2)
Total Protein: 6.3 g/dL — ABNORMAL LOW (ref 6.5–8.1)

## 2017-02-04 LAB — BRAIN NATRIURETIC PEPTIDE: B Natriuretic Peptide: 1024.7 pg/mL — ABNORMAL HIGH (ref 0.0–100.0)

## 2017-02-04 MED ORDER — FUROSEMIDE 10 MG/ML IJ SOLN
40.0000 mg | INTRAMUSCULAR | Status: AC
  Administered 2017-02-04: 40 mg via INTRAVENOUS
  Filled 2017-02-04: qty 4

## 2017-02-04 MED ORDER — DEXTROSE 5 % IV SOLN
1.0000 g | Freq: Once | INTRAVENOUS | Status: AC
  Administered 2017-02-04: 1 g via INTRAVENOUS
  Filled 2017-02-04: qty 10

## 2017-02-04 NOTE — ED Triage Notes (Signed)
Pt presents screaming, "please help me." She reports that she has not urinated properly in days. She reports bladder and rectal pain. Husband also states that she is constipated. She reports that her mouth and body is also very dry. A&Ox3.

## 2017-02-04 NOTE — ED Notes (Signed)
This RN went into pt's room to draw labs and start IV. Pt requested to use bed pan. Will start IV and draw labs once pt is finished.

## 2017-02-04 NOTE — ED Notes (Signed)
ED Provider at bedside. 

## 2017-02-04 NOTE — ED Notes (Signed)
Pt has been placed on purewick catheter at this time.

## 2017-02-04 NOTE — ED Notes (Signed)
Patient transported to X-ray 

## 2017-02-04 NOTE — ED Notes (Addendum)
Pt was able to void a considerable amount in the bed. MD notified and pt cleaned and linens changed.

## 2017-02-04 NOTE — ED Provider Notes (Signed)
[facility]-EMERGENCY DEPT Provider Note   CSN: [id number] Arrival date & time: 02/04/17  1559     History   Chief Complaint Chief Complaint  Patient presents with  . Urinary Retention  . Rectal Pain  . Constipation  . Dry Mouth    HPI [name] is a 83 y.o. female.  83yo F w/ PMH including CAD, ischemic cardiomyopathy, CKD, dCHF, HTN, CVA, dementia, rectal prolapse who p/w urinary difficulty and rectal pain with constipation.  The patient has a long-standing history of rectal pain and constipation.  She states that today she was having rectal pain while trying to defecate.  She had a bowel movement this morning and last bowel movement prior to that was the day before yesterday.  She reports problems trying to urinate, feeling like she has to urinate but not able to get the urine out.  She was recently seen for urinary retention and had a Foley catheter placed that was taken out yesterday.  She has had difficulty voiding since then.  She denies any fevers, vomiting, abdominal pain, bloody stools, diarrhea.  She has a chronic cough that is not changed recently.  She denies any associated chest pain or shortness of breath.  LEVEL 5 CAVEAT DUE TO DEMENTIA   The history is provided by the patient.  Constipation      Past Medical History:  Diagnosis Date  . Anemia   . Anxiety disorder   . Carotid artery occlusion   . Carotid bruit   . Chronic kidney disease (CKD), stage III (moderate) (HCC)   . Coronary artery disease    Remote PCI. Had BMS to RCA and DES stent to Diagonal in Jan. 2012   . Diastolic heart failure    EF 55 to 60% per cath in Jan 2012  . DVT (deep venous thrombosis) (HCC)   . Gout   . Hyperlipidemia   . Hypertension   . Ischemic cardiomyopathy    with prior EF of 35%  . NSTEMI (non-ST elevated myocardial infarction) [facility]) Jan 2012   with PCI to RCA and DX per Dr. Excell Seltzer  . Obesity   . Pneumonia 2016  . Proteinuria   . Stroke  [facility]) 2013   ?  mini    . TIA (transient ischemic attack)   . Type II diabetes mellitus (HCC)    long standing    Patient Active Problem List   Diagnosis Date Noted  . Hypoxia 02/04/2017  . Abnormal CXR 02/04/2017  . Accelerated hypertension 02/04/2017  . LBBB (left bundle branch block) 02/04/2017  . Diarrhea 12/09/2016  . Delusions (HCC) 09/28/2016  . Medication noncompliance due to cognitive impairment 09/28/2016  . Hemorrhoids 05/04/2016  . Pain in joint, lower leg 01/20/2016  . Ptosis 11/04/2015  . Glaucoma 08/25/2015  . Dementia without behavioral disturbance 06/10/2015  . Expressive aphasia 05/26/2015  . Anal prolapse 02/25/2015  . Chronic kidney disease, stage III (moderate) (HCC) 02/10/2014  . PAD (peripheral artery disease) (HCC) 01/17/2014  . Constipation 07/31/2013  . Atherosclerosis of native artery of extremity with intermittent claudication (HCC) 07/04/2013  . Anxiety disorder 02/21/2013  . Diabetes mellitus with nephropathy (HCC) 07/05/2012  . Occlusion and stenosis of carotid artery without mention of cerebral infarction 06/21/2012  . Coronary artery disease   . Chronic diastolic heart failure, NYHA class 1 (HCC)   . Hyperlipidemia   . Hypertension   . Carotid bruit   . Gout   . Obesity   .  TIA (transient ischemic attack)     Past Surgical History:  Procedure Laterality Date  . ABDOMINAL AORTAGRAM N/A 01/17/2014   [facility] Sherren KernsFields[name][facility]  . ANGIOGRAM EXTREMITY BILATERAL Bilateral 01/17/2014   [facility]ernsFields[name]  . ANKLE FUSION Bilateral   . APPENDECTOMY    . CATARACT EXTRACTION, BILATERAL Bilateral   . CORONARY ANGIOPLASTY WITH STENT PLACEMENT  04/05/2010   distal RCA  . FRACTURE SURGERY Right 2005   bilateral ankles   . PERCUTANEOUS STENT INTERVENTION Bilateral 01/17/2014   [facility]ernsFields[name]  . TONSILLECTOMY      OB History    No data available        Home Medications    Prior to Admission medications   Medication Sig Start Date End Date Taking? Authorizing Provider  aspirin EC 81 MG tablet Take 1 tablet (81 mg total) by mouth daily. 12/11/16  Yes Albertine GratesXu, Fang, MD  BYSTOLIC 20 MG TABS TAKE 1 TABLET (20 MG TOTAL) BY MOUTH DAILY. FOR BLOOD PRESSURE 12/26/16  Yes Sharon SellerEubanks, Jessica K, NP  cloNIDine (CATAPRES) 0.1 MG tablet Take 1 tablet (0.1 mg total) by mouth daily. For high blood pressure 09/28/16  Yes Montez Moritaarter, Monica, DO  docusate sodium (COLACE) 100 MG capsule Take 1 capsule (100 mg total) by mouth every 12 (twelve) hours. 01/09/17  Yes Ward, Layla MawKristen N, DO  ezetimibe-simvastatin (VYTORIN) 10-20 MG tablet TAKE 1/2 TABLET BY MOUTH ONCE NIGHTLY TO CONTROL CHOLESTEROL 01/19/17  Yes Montez Moritaarter, Monica, DO  furosemide (LASIX) 20 MG tablet TAKE 1 TABLET BY MOUTH TWICE DAILY FOR HYPERTENSION 01/16/17  Yes Kirt Boysarter, Monica, DO  hydrocortisone (ANUSOL-HC) 2.5 % rectal cream Place 1 application 2 (two) times daily rectally. 02/01/17  Yes McDonald, Mia A, PA-C  JANUVIA 100 MG tablet Take 100 mg daily by mouth.  11/15/16  Yes [provider]  losartan (COZAAR) 50 MG tablet Take 50 mg daily by mouth.  11/15/16  Yes [provider]  Loteprednol Etabonate (LOTEMAX) 0.5 % GEL Place 1 drop into the left eye daily. PLACE 1 DROP IN THE LEFT EYE TWICE DAILY 01/19/17  Yes Kirt Boysarter, Monica, DO  meloxicam (MOBIC) 15 MG tablet Take 15 mg daily by mouth.  01/16/17  Yes [provider]  nitroGLYCERIN (NITROSTAT) 0.4 MG SL tablet Place one under tongue for chest pain, up to 3 tablets 09/28/16  Yes Montez Moritaarter, Monica, DO  polyethylene glycol powder (GLYCOLAX/MIRALAX) powder Take 17 g daily by mouth.  01/16/17  Yes [provider]  PROCTOZONE-HC 2.5 % rectal cream Place 1 application 2 (two) times daily rectally.  01/16/17  Yes [provider]  SPS 15 GM/60ML suspension Take 15 g once by mouth.  12/08/16  Yes [provider]  timolol  (BETIMOL) 0.5 % ophthalmic solution INSTILL 1 DROP INTO THE LEFT EYE DAILY 09/28/16  Yes Montez Moritaarter, Maxine GlennMonica, DO  feeding supplement, ENSURE ENLIVE, (ENSURE ENLIVE) LIQD Take 237 mLs by mouth 2 (two) times daily between meals. Patient not taking: Reported on 02/04/2017 12/11/16   Albertine GratesXu, Fang, MD  JANUVIA 25 MG tablet TAKE ONE TABLET BY MOUTH DAILY Patient not taking: Reported on 02/04/2017 01/16/17   Kirt Boysarter, Monica, DO    Family History Family History  Problem Relation Age of Onset  . Diabetes Daughter   . CAD Neg Hx     Social History Social History   Tobacco Use  . Smoking status:  Former Smoker  . Smokeless tobacco: Never Used  . Tobacco comment: "smoked in my teens; quit after 1 year or so"  Substance Use Topics  . Alcohol use: No    Alcohol/week: 0.0 oz  . Drug use: No     Allergies   Namzaric [memantine hcl-donepezil hcl]; Percocet [oxycodone-acetaminophen]; Sulfa antibiotics; Adhesive [tape]; Metformin and related; Tradjenta [linagliptin]; and Penicillins   Review of Systems Review of Systems  Unable to perform ROS: Dementia  Gastrointestinal: Positive for constipation.    Physical Exam Updated Vital Signs BP (!) 177/153   Pulse 67   Temp 99.5 F (37.5 C) (Rectal)   Resp 18   Ht 5' (1.524 m)   Wt 56.7 kg (125 lb)   SpO2 91%   BMI 24.41 kg/m   Physical Exam  Constitutional: She is oriented to person, place, and time. She appears well-developed and well-nourished. No distress.  HENT:  Head: Normocephalic and atraumatic.  Moist mucous membranes  Eyes: Conjunctivae are normal.  Neck: Neck supple.  Cardiovascular: Normal rate, regular rhythm and normal heart sounds.  No murmur heard. Pulmonary/Chest: Effort normal and breath sounds normal.  Abdominal: Soft. Bowel sounds are normal. She exhibits no distension. There is no tenderness.  Scar across suprapubic abdomen  Genitourinary:  Genitourinary Comments: No obvious prolapse of rectum  Musculoskeletal: She  exhibits no edema.  Neurological: She is alert and oriented to person, place, and time.  Fluent speech  Skin: Skin is warm and dry.  Psychiatric: She has a normal mood and affect.  Nursing note and vitals reviewed.  Chaperone was present during exam.   ED Treatments / Results  Labs (all labs ordered are listed, but only abnormal results are displayed) Labs Reviewed  URINALYSIS, ROUTINE W REFLEX MICROSCOPIC - Abnormal; Notable for the following components:      Result Value   APPearance   (*)    Value: QUESTIONABLE RESULTS, RECOMMEND RECOLLECT TO VERIFY   Glucose, UA   (*)    Value: QUESTIONABLE RESULTS, RECOMMEND RECOLLECT TO VERIFY   Hgb urine dipstick   (*)    Value: QUESTIONABLE RESULTS, RECOMMEND RECOLLECT TO VERIFY   Bilirubin Urine   (*)    Value: QUESTIONABLE RESULTS, RECOMMEND RECOLLECT TO VERIFY   Ketones, ur   (*)    Value: QUESTIONABLE RESULTS, RECOMMEND RECOLLECT TO VERIFY   Protein, ur   (*)    Value: QUESTIONABLE RESULTS, RECOMMEND RECOLLECT TO VERIFY   Nitrite   (*)    Value: QUESTIONABLE RESULTS, RECOMMEND RECOLLECT TO VERIFY   Leukocytes, UA   (*)    Value: QUESTIONABLE RESULTS, RECOMMEND RECOLLECT TO VERIFY   All other components within normal limits  COMPREHENSIVE METABOLIC PANEL - Abnormal; Notable for the following components:   Glucose, Bld 104 (*)    BUN 32 (*)    Creatinine, Ser 2.05 (*)    Calcium 8.7 (*)    Total Protein 6.3 (*)    AST 13 (*)    ALT 7 (*)    GFR calc non Af Amer 21 (*)    GFR calc Af Amer 24 (*)    All other components within normal limits  CBC - Abnormal; Notable for the following components:   RBC 3.57 (*)    Hemoglobin 10.7 (*)    HCT 33.2 (*)    All other components within normal limits  BRAIN NATRIURETIC PEPTIDE - Abnormal; Notable for the following components:   B Natriuretic Peptide 1,024.7 (*)    All  other components within normal limits  URINALYSIS, ROUTINE W REFLEX MICROSCOPIC - Abnormal; Notable for the  following components:   Hgb urine dipstick SMALL (*)    Protein, ur 100 (*)    Leukocytes, UA MODERATE (*)    Bacteria, UA MANY (*)    All other components within normal limits  URINE CULTURE  TROPONIN I  TROPONIN I  TROPONIN I    EKG  EKG Interpretation None       Radiology Dg Chest 2 View  Result Date: 02/04/2017 CLINICAL DATA:  Bladder and rectal pain. EXAM: CHEST  2 VIEW COMPARISON:  December 09, 2016 FINDINGS: Stable cardiomegaly. The hila and mediastinum are stable. Chronic interstitial opacities remain. No new focal infiltrate. No nodule or mass. No pneumothorax. No other acute abnormalities. IMPRESSION: 1. Chronic interstitial changes are stable.  No acute abnormalities. Electronically Signed   By: Gerome Samavid  Williams III M.D   On: 02/04/2017 18:05   Dg Abdomen 1 View  Result Date: 02/04/2017 CLINICAL DATA:  Bladder and rectal pain. EXAM: ABDOMEN - 1 VIEW COMPARISON:  February 01, 2017 FINDINGS: The lung bases are not well assessed. No evidence of bowel obstruction. A calcification the pelvis was noted to be uterine on previous CT imaging. No renal or ureteral stones. A vascular stent is identified. No other acute abnormalities. IMPRESSION: 1. No acute abnormalities to explain the patient's symptoms identified. The lung bases are not well visualized. Electronically Signed   By: Gerome Samavid  Williams III M.D   On: 02/04/2017 18:04    Procedures Procedures (including critical care time)  Medications Ordered in ED Medications  cefTRIAXone (ROCEPHIN) 1 g in dextrose 5 % 50 mL IVPB (0 g Intravenous Stopped 02/04/17 2056)  furosemide (LASIX) injection 40 mg (40 mg Intravenous Given 02/04/17 2226)     Initial Impression / Assessment and Plan / ED Course  I have reviewed the triage vital signs and the nursing notes.  Pertinent labs & imaging results that were available during my care of the patient were reviewed by me and considered in my medical decision making (see chart for  details).     Long standing h/o constipation, rectal prolapse, urinary problems p/w similar complaints.  Was here a few days ago and Foley was placed for urinary retention, Foley removed yesterday.  She had 300 mL's urine on bladder scan for which she had to be catheterized.  UA is suggestive of infection with moderate leukocytes, large amount of WBCs and bacteria.  Given recent Foley catheter, will treat with CTX after review of previous cultures. Her O2 sat was in low 90s, although she did not complain of SOB or CP. CXR negative acute. KUB negative for obstruction or severe constipation and her last BM was today.   Labs show creatinine 2.05 which is similar to previous.  Normal WBC count.  BNP elevated at 1024.  Catheterized UA suggestive of infection with many bacteria, white blood cells, and leukocytes.  Gave ceftriaxone.  She was later able to void spontaneously therefore we will leave Foley catheter out for now and observe this problem.  She has remained hypoxic with 2 L oxygen requirement although she does not complain of any symptoms.  With elevated BNP I suspect CHF exacerbation.  Gave IV Lasix.  Discussed admission with hospitalist, Dr. Adela Glimpseoutova, and pt admitted for further treatment and work up. Final Clinical Impressions(s) / ED Diagnoses   Final diagnoses:  None    ED Discharge Orders    None  Chandria Rookstool, Ambrose Finland, MD 02/05/17 [phone]

## 2017-02-04 NOTE — H&P (Signed)
[name] WUJ:[id number] DOB: Sep 27, 1927 DOA: 02/04/2017     PCP: Kirt Boysarter, Monica, DO   Outpatient Specialists: [facility],    Patient coming from:  home Lives With family   Chief Complaint: Severe rectal pain and constipation  HPI: [name] is a 83 y.o. female with medical history significant of DM2,  HTN, Dementia, CKD stage 4 baseline Cr 1.84, CAD, chronic diastolic CHF, history of DVT, TIA, PAD    Presented with severe rectal pain while trying no have a bowel movement today she feels like she is trying to urinate but nothing is coming out. She has history in the past to urinary retention had a 40 placed and was discontinued on a yesterday. Since then she has been unable to void. Otherwise 7 had any fevers or chills no nausea no vomiting no chest pain shortness of breath. Patient have had evaluations in ER for rectal prolapse. Reports she did have a bowel movement today although she had some discomfort with that. In emergency no evidence of rectal prolapse In ER was able to urinate  UA showed evidence of UTI  Initially hypoxic ER reports no hx of lung disease that she is aware of she is a former smoker. She is a retired Scientist, forensicR nurse.  She has been recently started on Lasix.  Denies leg edema.  Denies cough or shortness of breath. Denies any chest pain.   Regarding pertinent Chronic problems: Remote history of CAD status post DES in 2012 History of peripheral arterial disease with last ABIs done in September 2016 0.59 on the right is 0.61 on the left.  Last hospital admission in September 2018 for Klebsiella pneumonia and diarrhea Patient has history of diabetes type 2 last hemoglobin AIC around 7 maintained on Januvia and metformin her metformin was discontinued in September   IN ER:  Temp (24hrs), Avg:98.8 F (37.1 C), Min:98.1 F (36.7 C), Max:99.5 F (37.5 C)      on arrival  ED Triage Vitals  Enc Vitals Group     BP 02/04/17 1610 (!) 159/69     Pulse Rate  02/04/17 1610 71     Resp 02/04/17 1610 (!) 22     Temp 02/04/17 1610 98.1 F (36.7 C)     Temp Source 02/04/17 1610 Oral     SpO2 02/04/17 1610 93 %     Weight 02/04/17 1954 125 lb (56.7 kg)     Height 02/04/17 1954 5' (1.524 m)     Head Circumference --      Peak Flow --      Pain Score 02/04/17 1613 10     Pain Loc --      Pain Edu? --      Excl. in GC? --     Latest  RR 18 96%BP 196/110 Na 143 K 4.7BUN 32 Cr 2.05  WBC 7.9 Hg 10.7 PLT 208 BNP up from 246 up to 1024  CXR - interstitial lung disease Following Medications were ordered in ER: Medications  cefTRIAXone (ROCEPHIN) 1 g in dextrose 5 % 50 mL IVPB (0 g Intravenous Stopped 02/04/17 2056)  furosemide (LASIX) injection 40 mg (40 mg Intravenous Given 02/04/17 2226)    Hospitalist was called for admission for possible diastolic CHF exacerbation and hypoxia  Review of Systems:    Pertinent positives include: Urinary retention  Constitutional:  No weight loss, night sweats, Fevers, chills, fatigue, weight loss  HEENT:  No headaches, Difficulty swallowing,Tooth/dental problems,Sore throat,  No  sneezing, itching, ear ache, nasal congestion, post nasal drip,  Cardio-vascular:  No chest pain, Orthopnea, PND, anasarca, dizziness, palpitations.no Bilateral lower extremity swelling  GI:  No heartburn, indigestion, abdominal pain, nausea, vomiting, diarrhea, change in bowel habits, loss of appetite, melena, blood in stool, hematemesis Resp:  no shortness of breath at rest. No dyspnea on exertion, No excess mucus, no productive cough, No non-productive cough, No coughing up of blood.No change in color of mucus.No wheezing. Skin:  no rash or lesions. No jaundice GU:  no dysuria, change in color of urine, no urgency or frequency. No straining to urinate.  No flank pain.  Musculoskeletal:  No joint pain or no joint swelling. No decreased range of motion. No back pain.  Psych:  No change in mood or affect. No depression or  anxiety. No memory loss.  Neuro: no localizing neurological complaints, no tingling, no weakness, no double vision, no gait abnormality, no slurred speech, no confusion  As per HPI otherwise 10 point review of systems negative.   Past Medical History: Past Medical History:  Diagnosis Date  . Anemia   . Anxiety disorder   . Carotid artery occlusion   . Carotid bruit   . Chronic kidney disease (CKD), stage III (moderate) (HCC)   . Coronary artery disease    Remote PCI. Had BMS to RCA and DES stent to Diagonal in Jan. 2012   . Diastolic heart failure    EF 55 to 60% per cath in Jan 2012  . DVT (deep venous thrombosis) (HCC)   . Gout   . Hyperlipidemia   . Hypertension   . Ischemic cardiomyopathy    with prior EF of 35%  . NSTEMI (non-ST elevated myocardial infarction) [facility]) Jan 2012   with PCI to RCA and DX per Dr. Excell Seltzer  . Obesity   . Pneumonia 2016  . Proteinuria   . Stroke [facility]) 2013   ?  mini    . TIA (transient ischemic attack)   . Type II diabetes mellitus (HCC)    long standing   Past Surgical History:  Procedure Laterality Date  . ABDOMINAL AORTAGRAM N/A 01/17/2014   Performed by Sherren Kerns, MD at [facility] CATH LAB  . ANGIOGRAM EXTREMITY BILATERAL Bilateral 01/17/2014   Performed by Sherren Kerns, MD at [facility] CATH LAB  . ANKLE FUSION Bilateral   . APPENDECTOMY    . CATARACT EXTRACTION, BILATERAL Bilateral   . CORONARY ANGIOPLASTY WITH STENT PLACEMENT  04/05/2010   distal RCA  . FRACTURE SURGERY Right 2005   bilateral ankles   . PERCUTANEOUS STENT INTERVENTION Bilateral 01/17/2014   Performed by Sherren Kerns, MD at [facility] CATH LAB  . TONSILLECTOMY       Social History:  Ambulatory   Independently     reports that she has quit smoking. she has never used smokeless tobacco. She reports that she does not drink alcohol or use drugs.  Allergies:   Allergies  Allergen Reactions  . Namzaric [Memantine Hcl-Donepezil Hcl] Nausea Only    confusion  .  Percocet [Oxycodone-Acetaminophen] Other (See Comments)    loosy goosy  . Sulfa Antibiotics Other (See Comments)    Tongue swells  . Adhesive [Tape] Other (See Comments)    Tears skin.  Please use "paper" tape  . Metformin And Related Diarrhea    Abnormal Kidney Functions   . Tradjenta [Linagliptin]     Low back ache, resolved once medication stopped   . Penicillins Hives  Tolerates Ceftriaxone, Has patient had a PCN reaction causing immediate rash, facial/tongue/throat swelling, SOB or lightheadedness with hypotension: Unknown Has patient had a PCN reaction causing severe rash involving mucus membranes or skin necrosis: No Has patient had a PCN reaction that required hospitalization No Has patient had a PCN reaction occurring within the last 10 years: No If all of the above answers are "NO", then may proceed with Cephalosporin use.        Family History:   Family History  Problem Relation Age of Onset  . Diabetes Daughter   . CAD Neg Hx     Medications: Prior to Admission medications   Medication Sig Start Date End Date Taking? Authorizing Provider  aspirin EC 81 MG tablet Take 1 tablet (81 mg total) by mouth daily. 12/11/16  Yes Albertine Grates, MD  BYSTOLIC 20 MG TABS TAKE 1 TABLET (20 MG TOTAL) BY MOUTH DAILY. FOR BLOOD PRESSURE 12/26/16  Yes Sharon Seller, NP  cloNIDine (CATAPRES) 0.1 MG tablet Take 1 tablet (0.1 mg total) by mouth daily. For high blood pressure 09/28/16  Yes Montez Morita, Monica, DO  docusate sodium (COLACE) 100 MG capsule Take 1 capsule (100 mg total) by mouth every 12 (twelve) hours. 01/09/17  Yes Ward, Layla Maw, DO  ezetimibe-simvastatin (VYTORIN) 10-20 MG tablet TAKE 1/2 TABLET BY MOUTH ONCE NIGHTLY TO CONTROL CHOLESTEROL 01/19/17  Yes Montez Morita, Monica, DO  furosemide (LASIX) 20 MG tablet TAKE 1 TABLET BY MOUTH TWICE DAILY FOR HYPERTENSION 01/16/17  Yes Kirt Boys, DO  hydrocortisone (ANUSOL-HC) 2.5 % rectal cream Place 1 application 2 (two) times daily  rectally. 02/01/17  Yes McDonald, Mia A, PA-C  JANUVIA 100 MG tablet Take 100 mg daily by mouth.  11/15/16  Yes [provider]  losartan (COZAAR) 50 MG tablet Take 50 mg daily by mouth.  11/15/16  Yes [provider]  Loteprednol Etabonate (LOTEMAX) 0.5 % GEL Place 1 drop into the left eye daily. PLACE 1 DROP IN THE LEFT EYE TWICE DAILY 01/19/17  Yes Kirt Boys, DO  meloxicam (MOBIC) 15 MG tablet Take 15 mg daily by mouth.  01/16/17  Yes [provider]  nitroGLYCERIN (NITROSTAT) 0.4 MG SL tablet Place one under tongue for chest pain, up to 3 tablets 09/28/16  Yes Montez Morita, Monica, DO  polyethylene glycol powder (GLYCOLAX/MIRALAX) powder Take 17 g daily by mouth.  01/16/17  Yes [provider]  PROCTOZONE-HC 2.5 % rectal cream Place 1 application 2 (two) times daily rectally.  01/16/17  Yes [provider]  SPS 15 GM/60ML suspension Take 15 g once by mouth.  12/08/16  Yes [provider]  timolol (BETIMOL) 0.5 % ophthalmic solution INSTILL 1 DROP INTO THE LEFT EYE DAILY 09/28/16  Yes Montez Morita, Maxine Glenn, DO  feeding supplement, ENSURE ENLIVE, (ENSURE ENLIVE) LIQD Take 237 mLs by mouth 2 (two) times daily between meals. Patient not taking: Reported on 02/04/2017 12/11/16   Albertine Grates, MD  JANUVIA 25 MG tablet TAKE ONE TABLET BY MOUTH DAILY Patient not taking: Reported on 02/04/2017 01/16/17   Kirt Boys, DO    Physical Exam: Patient Vitals for the past 24 hrs:  BP Temp Temp src Pulse Resp SpO2 Height Weight  02/04/17 2245 (!) 196/110 - - 65 18 96 % - -  02/04/17 2156 (!) 193/62 - - 66 16 96 % - -  02/04/17 2057 (!) 158/98 - - 70 18 98 % - -  02/04/17 2021 (!) 158/98 - - 72 18 98 % - -  02/04/17 1954 - - - - - - 5' (1.524 m) 56.7 kg (125 lb)  02/04/17 1949 (!) 189/65 99.5 F (37.5 C) Rectal 72 18 94 % - -  02/04/17 1730 (!) 193/48 - - 66 16 98 % - -  02/04/17 1630 (!) 194/47 - - 67 18 96 % - -  02/04/17 1610 (!) 159/69 98.1 F (36.7 C) Oral 71  (!) 22 93 % - -    1. General:  in No Acute distress   Chronically ill  -appearing 2. Psychological: Alert and  Oriented to self and situation 3. Head/ENT:     Dry Mucous Membranes                          Head Non traumatic, neck supple                          Poor Dentition 4. SKIN:   decreased Skin turgor,  Skin clean Dry and intact no rash 5. Heart: Regular rate and rhythm no  Murmur, no Rub or gallop 6. Lungs: , no wheezes or crackles  slightly coarse breath sounds 7. Abdomen: Soft, non-tender, Non distended  bowel sounds present 8. Lower extremities: no clubbing, cyanosis, or edema 9. Neurologically Grossly intact, moving all 4 extremities equally   10. MSK: Normal range of motion   body mass index is 24.41 kg/m.  Labs on Admission:   Labs on Admission: I have personally reviewed following labs and imaging studies  CBC: Recent Labs  Lab 02/01/17 2146 02/04/17 1950  WBC 6.0 7.9  HGB 10.8* 10.7*  HCT 34.2* 33.2*  MCV 93.4 93.0  PLT 200 208   Basic Metabolic Panel: Recent Labs  Lab 02/01/17 2146 02/04/17 1950  NA 141 143  K 3.9 4.7  CL 109 111  CO2 26 27  GLUCOSE 98 104*  BUN 28* 32*  CREATININE 1.84* 2.05*  CALCIUM 8.6* 8.7*   GFR: Estimated Creatinine Clearance: 15.3 mL/min (A) (by C-G formula based on SCr of 2.05 mg/dL (H)). Liver Function Tests: Recent Labs  Lab 02/01/17 2146 02/04/17 1950  AST 15 13*  ALT 9* 7*  ALKPHOS 59 59  BILITOT 0.5 0.5  PROT 6.1* 6.3*  ALBUMIN 3.3* 3.6   No results for input(s): LIPASE, AMYLASE in the last 168 hours. No results for input(s): AMMONIA in the last 168 hours. Coagulation Profile: No results for input(s): INR, PROTIME in the last 168 hours. Cardiac Enzymes: No results for input(s): CKTOTAL, CKMB, CKMBINDEX, TROPONINI in the last 168 hours. BNP (last 3 results) No results for input(s): PROBNP in the last 8760 hours. HbA1C: No results for input(s): HGBA1C in the last 72 hours. CBG: No results for  input(s): GLUCAP in the last 168 hours. Lipid Profile: No results for input(s): CHOL, HDL, LDLCALC, TRIG, CHOLHDL, LDLDIRECT in the last 72 hours. Thyroid Function Tests: No results for input(s): TSH, T4TOTAL, FREET4, T3FREE, THYROIDAB in the last 72 hours. Anemia Panel: No results for input(s): VITAMINB12, FOLATE, FERRITIN, TIBC, IRON, RETICCTPCT in the last 72 hours. Urine analysis:    Component Value Date/Time   COLORURINE YELLOW 02/04/2017 1615   COLORURINE YELLOW 02/04/2017 1615   APPEARANCEUR (A) 02/04/2017 1615    QUESTIONABLE RESULTS, RECOMMEND RECOLLECT TO VERIFY   APPEARANCEUR CLEAR 02/04/2017 1615   APPEARANCEUR Clear 02/21/2013 1622   LABSPEC  02/04/2017 1615    QUESTIONABLE RESULTS, RECOMMEND RECOLLECT TO VERIFY  LABSPEC 1.008 02/04/2017 1615   PHURINE  02/04/2017 1615    QUESTIONABLE RESULTS, RECOMMEND RECOLLECT TO VERIFY   PHURINE 5.0 02/04/2017 1615   GLUCOSEU (A) 02/04/2017 1615    QUESTIONABLE RESULTS, RECOMMEND RECOLLECT TO VERIFY   GLUCOSEU NEGATIVE 02/04/2017 1615   HGBUR (A) 02/04/2017 1615    QUESTIONABLE RESULTS, RECOMMEND RECOLLECT TO VERIFY   HGBUR SMALL (A) 02/04/2017 1615   BILIRUBINUR (A) 02/04/2017 1615    QUESTIONABLE RESULTS, RECOMMEND RECOLLECT TO VERIFY   BILIRUBINUR NEGATIVE 02/04/2017 1615   BILIRUBINUR Negative 02/21/2013 1622   KETONESUR (A) 02/04/2017 1615    QUESTIONABLE RESULTS, RECOMMEND RECOLLECT TO VERIFY   KETONESUR NEGATIVE 02/04/2017 1615   PROTEINUR (A) 02/04/2017 1615    QUESTIONABLE RESULTS, RECOMMEND RECOLLECT TO VERIFY   PROTEINUR 100 (A) 02/04/2017 1615   UROBILINOGEN 0.2 12/23/2014 1513   NITRITE (A) 02/04/2017 1615    QUESTIONABLE RESULTS, RECOMMEND RECOLLECT TO VERIFY   NITRITE NEGATIVE 02/04/2017 1615   LEUKOCYTESUR (A) 02/04/2017 1615    QUESTIONABLE RESULTS, RECOMMEND RECOLLECT TO VERIFY   LEUKOCYTESUR MODERATE (A) 02/04/2017 1615   LEUKOCYTESUR Negative 02/21/2013 1622   Sepsis  Labs: @LABRCNTIP (procalcitonin:4,lacticidven:4) )No results found for this or any previous visit (from the past 240 hour(s)).    UA    evidence of UTI     Lab Results  Component Value Date   HGBA1C 7.1 (H) 08/29/2016    Estimated Creatinine Clearance: 15.3 mL/min (A) (by C-G formula based on SCr of 2.05 mg/dL (H)).  BNP (last 3 results) No results for input(s): PROBNP in the last 8760 hours.   ECG REPORT  Independently reviewed Rate:64   Rhythm: LBBB ST&T Change: NA QTC 445  Filed Weights   02/04/17 1954  Weight: 56.7 kg (125 lb)     Cultures:    Component Value Date/Time   SDES URINE, RANDOM 11/26/2016 1650   SPECREQUEST NONE 11/26/2016 1650   CULT >=100,000 COLONIES/mL KLEBSIELLA PNEUMONIAE (A) 11/26/2016 1650   REPTSTATUS 11/29/2016 FINAL 11/26/2016 1650     Radiological Exams on Admission: Dg Chest 2 View  Result Date: 02/04/2017 CLINICAL DATA:  Bladder and rectal pain. EXAM: CHEST  2 VIEW COMPARISON:  December 09, 2016 FINDINGS: Stable cardiomegaly. The hila and mediastinum are stable. Chronic interstitial opacities remain. No new focal infiltrate. No nodule or mass. No pneumothorax. No other acute abnormalities. IMPRESSION: 1. Chronic interstitial changes are stable.  No acute abnormalities. Electronically Signed   By: Gerome Samavid  Williams III M.D   On: 02/04/2017 18:05   Dg Abdomen 1 View  Result Date: 02/04/2017 CLINICAL DATA:  Bladder and rectal pain. EXAM: ABDOMEN - 1 VIEW COMPARISON:  February 01, 2017 FINDINGS: The lung bases are not well assessed. No evidence of bowel obstruction. A calcification the pelvis was noted to be uterine on previous CT imaging. No renal or ureteral stones. A vascular stent is identified. No other acute abnormalities. IMPRESSION: 1. No acute abnormalities to explain the patient's symptoms identified. The lung bases are not well visualized. Electronically Signed   By: Gerome Samavid  Williams III M.D   On: 02/04/2017 18:04    Chart has been  reviewed    Assessment/Plan   83 y.o. female with medical history significant of DM2,  HTN, Dementia, CKD stage 4 baseline Cr 1.84, CAD, chronic diastolic CHF, history of DVT, TIA, PAD  Admitted for hypoxia secondary to interstitial lung disease versus acute on chronic diastolic CHF  Present on Admission: . Hypoxia - etiology unclear could potentially secondary to interstitial  lung disease. We will obtain noncontrast CT of the chest given patient history of CKD she denies any cough or wheezing. The chest x-ray findings have been persistently showing interstitial pattern for many years.  Acute on chronic diastolic CHF exacerbation - patient does have elevated BNP abnormal chest x-ray fluid status is a bit difficult to determine. She denies any recent leg edema or shortness of breath. Will obtain echogram. If evidence of CHF would obtain cardiology consult and have patient followed up with cardiology. For now will continue Lasix IV. Marland Kitchen PAD (peripheral artery disease) (HCC) - stable continue home medications aspirin current asymptomatic  . Hyperlipidemia stable continue statins . Diabetes mellitus with nephropathy (HCC)  - Order Sensitive  SSI     -  check TSH and HgA1C  - Hold by mouth medications   UTI - patient have had urinary discomfort UA worrisome for UTI. For now treat with Rocephin await results of urine culture try to avoid foley catheter History of urinary retention at this point able to urinate spontaneously - she may have had sensation of incomplete emptying secondary to UTI. Continue when necessary in and out catheterizations if evidence of urinary retention . Coronary artery disease stable continue statin aspirin and beta blocker . Abnormal CXR- obtain CT of the chest to further evaluated if evidence of interstitial lung disease causing hypoxia patient will likely need on a long-term oxygen and follow-up with pulmonology . Accelerated hypertension -  restart home medications . LBBB  (left bundle branch block)  - chronic unchanged from prior EKG Stiff constipation currently resolved patient have had bowel movements repeatedly  Other plan as per orders.  DVT prophylaxis:   Lovenox     Code Status:  FULL CODE  as per patient   Family Communication:   Family not  at  Bedside  plan of care was discussed with  Her Domestic partner.  Disposition Plan:    To home once workup is complete and patient is stable                                                Consults called: NONE  Admission status:      obs   Level of care     tele           I have spent a total of 56 min on this admission   Kathrynne Kulinski 02/05/2017, 12:22 AM    [facility]  Pager [phone]   after 2 AM please page floor coverage PA If 7AM-7PM, please contact the day team taking care of the patient  [email or web address]  Password [email or web address]

## 2017-02-05 ENCOUNTER — Inpatient Hospital Stay (HOSPITAL_COMMUNITY): Payer: Medicare Other

## 2017-02-05 DIAGNOSIS — R197 Diarrhea, unspecified: Secondary | ICD-10-CM

## 2017-02-05 DIAGNOSIS — N390 Urinary tract infection, site not specified: Secondary | ICD-10-CM | POA: Clinically undetermined

## 2017-02-05 DIAGNOSIS — I340 Nonrheumatic mitral (valve) insufficiency: Secondary | ICD-10-CM

## 2017-02-05 DIAGNOSIS — I5043 Acute on chronic combined systolic (congestive) and diastolic (congestive) heart failure: Secondary | ICD-10-CM | POA: Diagnosis present

## 2017-02-05 DIAGNOSIS — I5033 Acute on chronic diastolic (congestive) heart failure: Secondary | ICD-10-CM

## 2017-02-05 LAB — GLUCOSE, CAPILLARY
GLUCOSE-CAPILLARY: 100 mg/dL — AB (ref 65–99)
GLUCOSE-CAPILLARY: 114 mg/dL — AB (ref 65–99)
Glucose-Capillary: 103 mg/dL — ABNORMAL HIGH (ref 65–99)
Glucose-Capillary: 110 mg/dL — ABNORMAL HIGH (ref 65–99)

## 2017-02-05 LAB — TSH: TSH: 2.485 u[IU]/mL (ref 0.350–4.500)

## 2017-02-05 LAB — CBC WITH DIFFERENTIAL/PLATELET
BASOS PCT: 0 %
Basophils Absolute: 0 10*3/uL (ref 0.0–0.1)
EOS ABS: 0.2 10*3/uL (ref 0.0–0.7)
EOS PCT: 3 %
HCT: 33.6 % — ABNORMAL LOW (ref 36.0–46.0)
HEMOGLOBIN: 10.8 g/dL — AB (ref 12.0–15.0)
Lymphocytes Relative: 14 %
Lymphs Abs: 1.1 10*3/uL (ref 0.7–4.0)
MCH: 29.9 pg (ref 26.0–34.0)
MCHC: 32.1 g/dL (ref 30.0–36.0)
MCV: 93.1 fL (ref 78.0–100.0)
Monocytes Absolute: 0.5 10*3/uL (ref 0.1–1.0)
Monocytes Relative: 7 %
NEUTROS PCT: 76 %
Neutro Abs: 5.8 10*3/uL (ref 1.7–7.7)
PLATELETS: 166 10*3/uL (ref 150–400)
RBC: 3.61 MIL/uL — AB (ref 3.87–5.11)
RDW: 14.1 % (ref 11.5–15.5)
WBC: 7.6 10*3/uL (ref 4.0–10.5)

## 2017-02-05 LAB — BASIC METABOLIC PANEL
Anion gap: 11 (ref 5–15)
BUN: 28 mg/dL — ABNORMAL HIGH (ref 6–20)
CALCIUM: 8.4 mg/dL — AB (ref 8.9–10.3)
CO2: 22 mmol/L (ref 22–32)
CREATININE: 1.94 mg/dL — AB (ref 0.44–1.00)
Chloride: 109 mmol/L (ref 101–111)
GFR calc non Af Amer: 22 mL/min — ABNORMAL LOW (ref >60)
GFR, EST AFRICAN AMERICAN: 26 mL/min — AB (ref >60)
Glucose, Bld: 103 mg/dL — ABNORMAL HIGH (ref 65–99)
Potassium: 3.5 mmol/L (ref 3.5–5.1)
SODIUM: 142 mmol/L (ref 135–145)

## 2017-02-05 LAB — HEMOGLOBIN A1C
HEMOGLOBIN A1C: 6.2 % — AB (ref 4.8–5.6)
MEAN PLASMA GLUCOSE: 131.24 mg/dL

## 2017-02-05 LAB — TROPONIN I
TROPONIN I: 0.03 ng/mL — AB (ref <0.03)
Troponin I: 0.03 ng/mL (ref <0.03)
Troponin I: 0.04 ng/mL (ref <0.03)

## 2017-02-05 LAB — ECHOCARDIOGRAM COMPLETE
Height: 60 in
Weight: 1936.52 oz

## 2017-02-05 LAB — MAGNESIUM: MAGNESIUM: 1.9 mg/dL (ref 1.7–2.4)

## 2017-02-05 MED ORDER — ENOXAPARIN SODIUM 30 MG/0.3ML ~~LOC~~ SOLN
30.0000 mg | SUBCUTANEOUS | Status: DC
  Administered 2017-02-05 – 2017-02-09 (×5): 30 mg via SUBCUTANEOUS
  Filled 2017-02-05 (×5): qty 0.3

## 2017-02-05 MED ORDER — HYDROCORTISONE 2.5 % RE CREA
TOPICAL_CREAM | Freq: Three times a day (TID) | RECTAL | Status: DC
  Administered 2017-02-05: 1 via RECTAL
  Administered 2017-02-06 – 2017-02-08 (×6): via RECTAL
  Filled 2017-02-05 (×2): qty 28.35

## 2017-02-05 MED ORDER — TRAMADOL HCL 50 MG PO TABS
50.0000 mg | ORAL_TABLET | Freq: Two times a day (BID) | ORAL | Status: DC | PRN
  Administered 2017-02-05 – 2017-02-08 (×2): 50 mg via ORAL
  Filled 2017-02-05 (×2): qty 1

## 2017-02-05 MED ORDER — SODIUM CHLORIDE 0.9% FLUSH
3.0000 mL | INTRAVENOUS | Status: DC | PRN
  Administered 2017-02-07: 3 mL via INTRAVENOUS
  Filled 2017-02-05: qty 3

## 2017-02-05 MED ORDER — FUROSEMIDE 10 MG/ML IJ SOLN
40.0000 mg | Freq: Every day | INTRAMUSCULAR | Status: DC
  Administered 2017-02-05: 40 mg via INTRAVENOUS
  Filled 2017-02-05: qty 4

## 2017-02-05 MED ORDER — SODIUM CHLORIDE 0.9% FLUSH
3.0000 mL | Freq: Two times a day (BID) | INTRAVENOUS | Status: DC
  Administered 2017-02-05 – 2017-02-09 (×8): 3 mL via INTRAVENOUS

## 2017-02-05 MED ORDER — POTASSIUM CHLORIDE CRYS ER 20 MEQ PO TBCR
40.0000 meq | EXTENDED_RELEASE_TABLET | Freq: Once | ORAL | Status: AC
  Administered 2017-02-05: 40 meq via ORAL
  Filled 2017-02-05: qty 2

## 2017-02-05 MED ORDER — ONDANSETRON HCL 4 MG/2ML IJ SOLN: 4.0000 mg | Freq: Four times a day (QID) | INTRAMUSCULAR | Status: DC | PRN

## 2017-02-05 MED ORDER — INSULIN ASPART 100 UNIT/ML ~~LOC~~ SOLN: 0.0000 [IU] | Freq: Every day | SUBCUTANEOUS | Status: DC

## 2017-02-05 MED ORDER — SENNOSIDES-DOCUSATE SODIUM 8.6-50 MG PO TABS
1.0000 | ORAL_TABLET | Freq: Every day | ORAL | Status: DC
  Administered 2017-02-06: 1 via ORAL
  Filled 2017-02-05 (×3): qty 1

## 2017-02-05 MED ORDER — TIMOLOL MALEATE 0.5 % OP SOLN
1.0000 [drp] | Freq: Every day | OPHTHALMIC | Status: DC
  Administered 2017-02-05 – 2017-02-09 (×5): 1 [drp] via OPHTHALMIC
  Filled 2017-02-05: qty 5

## 2017-02-05 MED ORDER — INSULIN ASPART 100 UNIT/ML ~~LOC~~ SOLN
0.0000 [IU] | Freq: Three times a day (TID) | SUBCUTANEOUS | Status: DC
  Administered 2017-02-06: 1 [IU] via SUBCUTANEOUS
  Administered 2017-02-07: 2 [IU] via SUBCUTANEOUS
  Administered 2017-02-08: 1 [IU] via SUBCUTANEOUS
  Administered 2017-02-08: 2 [IU] via SUBCUTANEOUS
  Administered 2017-02-08: 1 [IU] via SUBCUTANEOUS

## 2017-02-05 MED ORDER — MORPHINE SULFATE (PF) 4 MG/ML IV SOLN: 1.0000 mg | Freq: Four times a day (QID) | INTRAVENOUS | Status: DC | PRN

## 2017-02-05 MED ORDER — ACETAMINOPHEN 325 MG PO TABS
650.0000 mg | ORAL_TABLET | ORAL | Status: DC | PRN
  Administered 2017-02-05 – 2017-02-08 (×2): 650 mg via ORAL
  Filled 2017-02-05 (×2): qty 2

## 2017-02-05 MED ORDER — NEBIVOLOL HCL 10 MG PO TABS
20.0000 mg | ORAL_TABLET | Freq: Every day | ORAL | Status: DC
  Administered 2017-02-05 – 2017-02-09 (×5): 20 mg via ORAL
  Filled 2017-02-05 (×5): qty 2

## 2017-02-05 MED ORDER — EZETIMIBE-SIMVASTATIN 10-20 MG PO TABS
0.5000 | ORAL_TABLET | Freq: Every day | ORAL | Status: DC
Start: 2017-02-05 — End: 2017-02-09
  Administered 2017-02-05 – 2017-02-08 (×4): 0.5 via ORAL
  Filled 2017-02-05 (×5): qty 0.5

## 2017-02-05 MED ORDER — SODIUM CHLORIDE 0.9 % IV SOLN: 250.0000 mL | INTRAVENOUS | Status: DC | PRN

## 2017-02-05 MED ORDER — ASPIRIN EC 81 MG PO TBEC
81.0000 mg | DELAYED_RELEASE_TABLET | Freq: Every day | ORAL | Status: DC
  Administered 2017-02-05 – 2017-02-09 (×5): 81 mg via ORAL
  Filled 2017-02-05 (×5): qty 1

## 2017-02-05 MED ORDER — FUROSEMIDE 10 MG/ML IJ SOLN
40.0000 mg | Freq: Every day | INTRAMUSCULAR | Status: DC
  Administered 2017-02-06: 40 mg via INTRAVENOUS
  Filled 2017-02-05 (×2): qty 4

## 2017-02-05 MED ORDER — CLONIDINE HCL 0.1 MG PO TABS
0.1000 mg | ORAL_TABLET | Freq: Every day | ORAL | Status: DC
Start: 2017-02-05 — End: 2017-02-08
  Administered 2017-02-05 – 2017-02-07 (×3): 0.1 mg via ORAL
  Filled 2017-02-05 (×3): qty 1

## 2017-02-05 MED ORDER — DEXTROSE 5 % IV SOLN
1.0000 g | INTRAVENOUS | Status: DC
  Administered 2017-02-05 – 2017-02-06 (×2): 1 g via INTRAVENOUS
  Filled 2017-02-05 (×3): qty 10

## 2017-02-05 MED ORDER — FUROSEMIDE 10 MG/ML IJ SOLN: 40.0000 mg | Freq: Two times a day (BID) | INTRAMUSCULAR | Status: DC

## 2017-02-05 NOTE — Progress Notes (Signed)
Patient c/o pain in rectum.  Round, raw tissue approx 1 in in diameter and 2-3 inches long appeared to be prolapsed through patient's rectum when she stands.  Patient states she has a hx of this for over a year.  Dr. Janee Mornhompson aware.  Patient states she has relief from hydrocortisone cream.  Dr. Janee Mornhompson on floor to see patient and new orders have been placed. Patient appears more relaxed at this time; will carry out new orders and continue to monitor.

## 2017-02-05 NOTE — Progress Notes (Signed)
Patient has voided numerous times today however due to incontinence of both urine and stool unable to measure patient's output.  Due to this and patient's hx of urinary retention bladder scanned patient about 20 min after patient had an episode of urinary incontinence all over the floor.  Bladder scanner showed less than 250 in bladder.  Will continue to monitor.

## 2017-02-05 NOTE — ED Notes (Signed)
Please call floor nurse for report at 0030. Number [phone]

## 2017-02-05 NOTE — Progress Notes (Signed)
CRITICAL VALUE ALERT  Critical Value:  Troponin 0.03  Date & Time Notied:  02/05/17 @ 0314  Provider Notified: Linton FlemingsBlount, X  Orders Received/Actions taken:

## 2017-02-05 NOTE — Progress Notes (Signed)
PROGRESS NOTE    [name]  WUJ:[id number] DOB: 12-Mar-1928 DOA: 02/04/2017 PCP: Kirt Boys, DO    Brief Narrative:  Patient is a 83 year old female with history of type 2 diabetes, hypertension, chronic kidney disease stage IV, chronic diastolic heart failure, history of DVT, peripheral artery disease who had presented with some rectal plane after having a bowel movement with difficulty urinating.  Patient also noted to be hypoxic and admitted for probable acute on chronic CHF exacerbation.   Assessment & Plan:   Principal Problem:   Acute on chronic diastolic CHF (congestive heart failure) (HCC) Active Problems:   Coronary artery disease   Hyperlipidemia   Hypertension   Diabetes mellitus with nephropathy (HCC)   PAD (peripheral artery disease) (HCC)   Hypoxia   Abnormal CXR   Accelerated hypertension   LBBB (left bundle branch block)  #1 acute on chronic diastolic heart failure Patient presented with hypoxic concern for acute on chronic diastolic heart failure.  Chest x-ray and CT chest worrisome for volume overload.  Cardiac enzymes minimally elevated.  2D echo pending.  Change Lasix to 40 mg IV every 12 hours.  Strict I's and O's.  Daily weights.  2.  Diarrhea Patient noted to have multiple loose stools per nursing.  Patient on laxatives.  Check a C. difficile PCR.  Check a GI pathogen panel.  Enteric precautions.  Follow.  3.  HTN Continue clonidine, Lasix, bystolic  4.  Hypertension Continue statin.  5.  Diabetes mellitus with nephropathy Hemoglobin A1c 6.2.  CBGs ranging from 100-114.  Continue sliding scale insulin.  6.  Coronary artery disease/left bundle branch block Cardiac enzymes minimally elevated.  2D echo pending.  Continue aspirin, beta-blocker, statin, aspirin.  Follow.  #7 chronic kidney disease stage IV Baseline approximately creatinine of 1.84.  Stable.  Follow.  #8 UTI Urine cultures pending.  IV Rocephin.     DVT prophylaxis:  Lovenox Code Status: Full Family Communication: Updated patient and friend at bedtime. Disposition Plan: To be determined.   Consultants:   None  Procedures:   2D echo 02/05/2017 pending  CT chest without contrast 02/05/2017  Abdominal x-ray 02/04/2017  Chest x-ray 02/04/2017  Antimicrobials:   IV Rocephin 02/04/2017   Subjective: Patient laying down.  Patient states she does not feel well.  Denies any chest pain.  Patient states some improvement with shortness of breath.  Per nursing patient having multiple loose stools. Patient on bedpan.  Objective: Vitals:   02/04/17 2318 02/05/17 0051 02/05/17 0121 02/05/17 0509  BP: (!) 177/153 (!) 200/88 109/72 (!) 179/51  Pulse: 67 64 69 64  Resp: 18 15 20 20   Temp:   98.7 F (37.1 C) 99.6 F (37.6 C)  TempSrc:   Oral Oral  SpO2: 91% 98% 98% 94%  Weight:   54.9 kg (121 lb 0.5 oz)   Height:   5' (1.524 m)     Intake/Output Summary (Last 24 hours) at 02/05/2017 1246 Last data filed at 02/04/2017 2056 Gross per 24 hour  Intake 50 ml  Output 325 ml  Net -275 ml   Filed Weights   02/04/17 1954 02/05/17 0121  Weight: 56.7 kg (125 lb) 54.9 kg (121 lb 0.5 oz)    Examination:  General exam: NAD Respiratory system: Clear to auscultation anterior lung fields.  Respiratory effort normal. Cardiovascular system: S1 & S2 heard, RRR. No JVD, murmurs, rubs, gallops or clicks. No pedal edema. Gastrointestinal system: Abdomen is nondistended, soft and nontender. No organomegaly or  masses felt. Normal bowel sounds heard. Central nervous system: Alert and oriented. No focal neurological deficits. Extremities: Symmetric 5 x 5 power. Skin: No rashes, lesions or ulcers Psychiatry: Judgement and insight appear normal. Mood & affect appropriate.     Data Reviewed: I have personally reviewed following labs and imaging studies  CBC: Recent Labs  Lab 02/01/17 2146 02/04/17 1950 02/05/17 0749  WBC 6.0 7.9 7.6  NEUTROABS  --    --  5.8  HGB 10.8* 10.7* 10.8*  HCT 34.2* 33.2* 33.6*  MCV 93.4 93.0 93.1  PLT 200 208 166   Basic Metabolic Panel: Recent Labs  Lab 02/01/17 2146 02/04/17 1950 02/05/17 0749  NA 141 143 142  K 3.9 4.7 3.5  CL 109 111 109  CO2 26 27 22   GLUCOSE 98 104* 103*  BUN 28* 32* 28*  CREATININE 1.84* 2.05* 1.94*  CALCIUM 8.6* 8.7* 8.4*  MG  --   --  1.9   GFR: Estimated Creatinine Clearance: 15.9 mL/min (A) (by C-G formula based on SCr of 1.94 mg/dL (H)). Liver Function Tests: Recent Labs  Lab 02/01/17 2146 02/04/17 1950  AST 15 13*  ALT 9* 7*  ALKPHOS 59 59  BILITOT 0.5 0.5  PROT 6.1* 6.3*  ALBUMIN 3.3* 3.6   No results for input(s): LIPASE, AMYLASE in the last 168 hours. No results for input(s): AMMONIA in the last 168 hours. Coagulation Profile: No results for input(s): INR, PROTIME in the last 168 hours. Cardiac Enzymes: Recent Labs  Lab 02/05/17 0155 02/05/17 0749  TROPONINI 0.03* 0.03*   BNP (last 3 results) No results for input(s): PROBNP in the last 8760 hours. HbA1C: Recent Labs    02/05/17 0155  HGBA1C 6.2*   CBG: Recent Labs  Lab 02/05/17 0816 02/05/17 1134  GLUCAP 100* 114*   Lipid Profile: No results for input(s): CHOL, HDL, LDLCALC, TRIG, CHOLHDL, LDLDIRECT in the last 72 hours. Thyroid Function Tests: Recent Labs    02/05/17 0155  TSH 2.485   Anemia Panel: No results for input(s): VITAMINB12, FOLATE, FERRITIN, TIBC, IRON, RETICCTPCT in the last 72 hours. Sepsis Labs: No results for input(s): PROCALCITON, LATICACIDVEN in the last 168 hours.  No results found for this or any previous visit (from the past 240 hour(s)).       Radiology Studies: Dg Chest 2 View  Result Date: 02/04/2017 CLINICAL DATA:  Bladder and rectal pain. EXAM: CHEST  2 VIEW COMPARISON:  December 09, 2016 FINDINGS: Stable cardiomegaly. The hila and mediastinum are stable. Chronic interstitial opacities remain. No new focal infiltrate. No nodule or mass. No  pneumothorax. No other acute abnormalities. IMPRESSION: 1. Chronic interstitial changes are stable.  No acute abnormalities. Electronically Signed   By: Gerome Samavid  Williams III M.D   On: 02/04/2017 18:05   Dg Abdomen 1 View  Result Date: 02/04/2017 CLINICAL DATA:  Bladder and rectal pain. EXAM: ABDOMEN - 1 VIEW COMPARISON:  February 01, 2017 FINDINGS: The lung bases are not well assessed. No evidence of bowel obstruction. A calcification the pelvis was noted to be uterine on previous CT imaging. No renal or ureteral stones. A vascular stent is identified. No other acute abnormalities. IMPRESSION: 1. No acute abnormalities to explain the patient's symptoms identified. The lung bases are not well visualized. Electronically Signed   By: Gerome Samavid  Williams III M.D   On: 02/04/2017 18:04   Ct Chest Wo Contrast  Result Date: 02/05/2017 CLINICAL DATA:  Hypoxia EXAM: CT CHEST WITHOUT CONTRAST TECHNIQUE: Multidetector CT imaging of  the chest was performed following the standard protocol without IV contrast. COMPARISON:  Chest x-ray 02/04/2017 FINDINGS: Cardiovascular: There is cardiomegaly. Diffuse coronary artery and aortic calcifications. No evidence of aortic aneurysm. Mediastinum/Nodes: Mildly enlarged mediastinal lymph nodes. Precarinal lymph node has a short axis diameter 15 mm. Other small borderline scratched at other small and borderline sized mediastinal lymph nodes. No visible hilar adenopathy. No axillary adenopathy. Lungs/Pleura: Small bilateral pleural effusions. There is mild interstitial prominence best seen in the mid and lower lungs, question early interstitial edema. Bibasilar atelectasis. No confluent opacities. Upper Abdomen: Imaging into the upper abdomen shows no acute findings. Musculoskeletal: Chest wall soft tissues are unremarkable. No acute bony abnormality. IMPRESSION: Cardiomegaly, severe/diffuse coronary artery disease and aortic atherosclerosis. Small bilateral effusions with mild  interstitial prominence in the mid and lower lung zones, question early interstitial edema. Bibasilar atelectasis. Electronically Signed   By: Charlett NoseKevin  Dover M.D.   On: 02/05/2017 08:39        Scheduled Meds: . aspirin EC  81 mg Oral Daily  . cloNIDine  0.1 mg Oral Daily  . enoxaparin (LOVENOX) injection  30 mg Subcutaneous Q24H  . ezetimibe-simvastatin  0.5 tablet Oral q1800  . furosemide  40 mg Intravenous Q12H  . insulin aspart  0-5 Units Subcutaneous QHS  . insulin aspart  0-9 Units Subcutaneous TID WC  . nebivolol  20 mg Oral Daily  . potassium chloride  40 mEq Oral Once  . sodium chloride flush  3 mL Intravenous Q12H  . timolol  1 drop Left Eye Daily   Continuous Infusions: . sodium chloride    . cefTRIAXone (ROCEPHIN)  IV       LOS: 1 day    Time spent: 35 mins    Ramiro Harvestaniel Brnadon Eoff, MD [facility] Pager [phone]  If 7PM-7AM, please contact night-coverage [email or web address] Password [email or web address] 02/05/2017, 12:46 PM

## 2017-02-05 NOTE — Progress Notes (Signed)
  Echocardiogram 2D Echocardiogram has been performed.  Ashley SavoyCasey N Walters [name] 02/05/2017, 1:33 PM

## 2017-02-06 DIAGNOSIS — K623 Rectal prolapse: Secondary | ICD-10-CM

## 2017-02-06 LAB — GLUCOSE, CAPILLARY
GLUCOSE-CAPILLARY: 137 mg/dL — AB (ref 65–99)
Glucose-Capillary: 87 mg/dL (ref 65–99)
Glucose-Capillary: 76 mg/dL (ref 65–99)
Glucose-Capillary: 118 mg/dL — ABNORMAL HIGH (ref 65–99)

## 2017-02-06 LAB — BASIC METABOLIC PANEL
Anion gap: 9 (ref 5–15)
BUN: 29 mg/dL — AB (ref 6–20)
CO2: 23 mmol/L (ref 22–32)
Calcium: 8.2 mg/dL — ABNORMAL LOW (ref 8.9–10.3)
Chloride: 107 mmol/L (ref 101–111)
Creatinine, Ser: 2.1 mg/dL — ABNORMAL HIGH (ref 0.44–1.00)
GFR calc Af Amer: 23 mL/min — ABNORMAL LOW (ref >60)
GFR, EST NON AFRICAN AMERICAN: 20 mL/min — AB (ref >60)
GLUCOSE: 84 mg/dL (ref 65–99)
POTASSIUM: 3.7 mmol/L (ref 3.5–5.1)
Sodium: 139 mmol/L (ref 135–145)

## 2017-02-06 MED ORDER — ENSURE ENLIVE PO LIQD
237.0000 mL | Freq: Two times a day (BID) | ORAL | Status: DC
  Administered 2017-02-07 – 2017-02-09 (×3): 237 mL via ORAL

## 2017-02-06 MED ORDER — FUROSEMIDE 20 MG PO TABS: 20.0000 mg | ORAL_TABLET | Freq: Two times a day (BID) | ORAL | Status: DC

## 2017-02-06 NOTE — Consult Note (Signed)
[facility] Surgery Consult Note  [name] [birth year]  [id number].    Requesting MD: Irine Seal Chief Complaint/Reason for Consult: rectal prolapse  HPI:  [name] is an 83yo female with PMH DM, HTN, Dementia, CKD-IV, CAD/LBBB, chronic diastolic CHF, h/o DVT, PAD, who was admitted to [facility] 11/17 with acute on chronic CHF exacerbation. She presented to the ED complaining of severe rectal pain after trying to have a bowel movement. The patient has a long-standing history of rectal pain and constipation. She reports 7-8 episodes of rectal prolapse over the last year. States that she suffers with diarrhea and constipation, but cannot tell me how frequently she has a bowel movement. Currently rectum is not prolapsed and she is not having any pain. Tolerated breakfast this morning. Had a BM last night. Denies CP, SOB, cough, abdominal pain, nausea, vomiting. Lives at home with family. Former Haematologist.  ROS: Review of Systems  Constitutional: Negative.   HENT: Positive for hearing loss.   Eyes: Negative.   Respiratory: Negative.   Cardiovascular: Negative.   Gastrointestinal: Positive for constipation and diarrhea.  Genitourinary:       Rectal pain  Musculoskeletal: Negative.   Skin: Negative.   Neurological: Negative.    All systems reviewed and otherwise negative except for as above  Family History  Problem Relation Age of Onset  . Diabetes Daughter   . CAD Neg Hx     Past Medical History:  Diagnosis Date  . Anemia   . Anxiety disorder   . Carotid artery occlusion   . Carotid bruit   . Chronic kidney disease (CKD), stage III (moderate) (HCC)   . Coronary artery disease    Remote PCI. Had BMS to RCA and DES stent to Diagonal in Jan. 2012   . Diastolic heart failure    EF 55 to 60% per cath in Jan 2012  . DVT (deep venous thrombosis) ([place])   . Gout   . Hyperlipidemia   . Hypertension   . Ischemic cardiomyopathy    with prior EF of 35%  . NSTEMI  (non-ST elevated myocardial infarction) [facility]) Jan 2012   with PCI to RCA and DX per Dr. Burt Knack  . Obesity   . Pneumonia 2016  . Proteinuria   . Stroke [facility]) 2013   ?  mini    . TIA (transient ischemic attack)   . Type II diabetes mellitus ([place])    long standing    Past Surgical History:  Procedure Laterality Date  . ABDOMINAL AORTAGRAM N/A 01/17/2014   Performed by Elam Dutch, MD at [facility] CATH LAB  . ANGIOGRAM EXTREMITY BILATERAL Bilateral 01/17/2014   Performed by Elam Dutch, MD at [facility] CATH LAB  . ANKLE FUSION Bilateral   . APPENDECTOMY    . CATARACT EXTRACTION, BILATERAL Bilateral   . CORONARY ANGIOPLASTY WITH STENT PLACEMENT  04/05/2010   distal RCA  . FRACTURE SURGERY Right 2005   bilateral ankles   . PERCUTANEOUS STENT INTERVENTION Bilateral 01/17/2014   Performed by Elam Dutch, MD at [facility] CATH LAB  . TONSILLECTOMY      Social History:  reports that she has quit smoking. she has never used smokeless tobacco. She reports that she does not drink alcohol or use drugs.  Allergies:  Allergies  Allergen Reactions  . Namzaric [Memantine Hcl-Donepezil Hcl] Nausea Only    confusion  . Percocet [Oxycodone-Acetaminophen] Other (See Comments)    loosy goosy  . Sulfa Antibiotics Other (See  Comments)    Tongue swells  . Adhesive [Tape] Other (See Comments)    Tears skin.  Please use "paper" tape  . Metformin And Related Diarrhea    Abnormal Kidney Functions   . Tradjenta [Linagliptin]     Low back ache, resolved once medication stopped   . Penicillins Hives    Tolerates Ceftriaxone, Has patient had a PCN reaction causing immediate rash, facial/tongue/throat swelling, SOB or lightheadedness with hypotension: Unknown Has patient had a PCN reaction causing severe rash involving mucus membranes or skin necrosis: No Has patient had a PCN reaction that required hospitalization No Has patient had a PCN reaction occurring within the last 10 years: No If all of the  above answers are "NO", then may proceed with Cephalosporin use.     Medications Prior to Admission  Medication Sig Dispense Refill  . aspirin EC 81 MG tablet Take 1 tablet (81 mg total) by mouth daily. 30 tablet 0  . BYSTOLIC 20 MG TABS TAKE 1 TABLET (20 MG TOTAL) BY MOUTH DAILY. FOR BLOOD PRESSURE 30 tablet 2  . cloNIDine (CATAPRES) 0.1 MG tablet Take 1 tablet (0.1 mg total) by mouth daily. For high blood pressure 30 tablet 3  . docusate sodium (COLACE) 100 MG capsule Take 1 capsule (100 mg total) by mouth every 12 (twelve) hours. 60 capsule 0  . ezetimibe-simvastatin (VYTORIN) 10-20 MG tablet TAKE 1/2 TABLET BY MOUTH ONCE NIGHTLY TO CONTROL CHOLESTEROL 15 tablet 2  . furosemide (LASIX) 20 MG tablet TAKE 1 TABLET BY MOUTH TWICE DAILY FOR HYPERTENSION 60 tablet 2  . hydrocortisone (ANUSOL-HC) 2.5 % rectal cream Place 1 application 2 (two) times daily rectally. 30 g 0  . JANUVIA 100 MG tablet Take 100 mg daily by mouth.     . losartan (COZAAR) 50 MG tablet Take 50 mg daily by mouth.     . Loteprednol Etabonate (LOTEMAX) 0.5 % GEL Place 1 drop into the left eye daily. PLACE 1 DROP IN THE LEFT EYE TWICE DAILY 5 g 0  . meloxicam (MOBIC) 15 MG tablet Take 15 mg daily by mouth.     . nitroGLYCERIN (NITROSTAT) 0.4 MG SL tablet Place one under tongue for chest pain, up to 3 tablets 30 tablet 3  . polyethylene glycol powder (GLYCOLAX/MIRALAX) powder Take 17 g daily by mouth.     Marland Kitchen PROCTOZONE-HC 2.5 % rectal cream Place 1 application 2 (two) times daily rectally.     . SPS 15 GM/60ML suspension Take 15 g once by mouth.     . timolol (BETIMOL) 0.5 % ophthalmic solution INSTILL 1 DROP INTO THE LEFT EYE DAILY 10 mL 1  . feeding supplement, ENSURE ENLIVE, (ENSURE ENLIVE) LIQD Take 237 mLs by mouth 2 (two) times daily between meals. (Patient not taking: Reported on 02/04/2017) 237 mL 12  . JANUVIA 25 MG tablet TAKE ONE TABLET BY MOUTH DAILY (Patient not taking: Reported on 02/04/2017) 30 tablet 2     Prior to Admission medications   Medication Sig Start Date End Date Taking? Authorizing Provider  aspirin EC 81 MG tablet Take 1 tablet (81 mg total) by mouth daily. 12/11/16  Yes Florencia Reasons, MD  BYSTOLIC 20 MG TABS TAKE 1 TABLET (20 MG TOTAL) BY MOUTH DAILY. FOR BLOOD PRESSURE 12/26/16  Yes Lauree Chandler, NP  cloNIDine (CATAPRES) 0.1 MG tablet Take 1 tablet (0.1 mg total) by mouth daily. For high blood pressure 09/28/16  Yes Eulas Post, Monica, DO  docusate sodium (COLACE) 100 MG capsule  Take 1 capsule (100 mg total) by mouth every 12 (twelve) hours. 01/09/17  Yes Ward, Delice Bison, DO  ezetimibe-simvastatin (VYTORIN) 10-20 MG tablet TAKE 1/2 TABLET BY MOUTH ONCE NIGHTLY TO CONTROL CHOLESTEROL 01/19/17  Yes Eulas Post, Monica, DO  furosemide (LASIX) 20 MG tablet TAKE 1 TABLET BY MOUTH TWICE DAILY FOR HYPERTENSION 01/16/17  Yes Gildardo Cranker, DO  hydrocortisone (ANUSOL-HC) 2.5 % rectal cream Place 1 application 2 (two) times daily rectally. 02/01/17  Yes McDonald, Mia A, PA-C  JANUVIA 100 MG tablet Take 100 mg daily by mouth.  11/15/16  Yes [provider]  losartan (COZAAR) 50 MG tablet Take 50 mg daily by mouth.  11/15/16  Yes [provider]  Loteprednol Etabonate (LOTEMAX) 0.5 % GEL Place 1 drop into the left eye daily. PLACE 1 DROP IN THE LEFT EYE TWICE DAILY 01/19/17  Yes Gildardo Cranker, DO  meloxicam (MOBIC) 15 MG tablet Take 15 mg daily by mouth.  01/16/17  Yes [provider]  nitroGLYCERIN (NITROSTAT) 0.4 MG SL tablet Place one under tongue for chest pain, up to 3 tablets 09/28/16  Yes Eulas Post, Monica, DO  polyethylene glycol powder (GLYCOLAX/MIRALAX) powder Take 17 g daily by mouth.  01/16/17  Yes [provider]  PROCTOZONE-HC 2.5 % rectal cream Place 1 application 2 (two) times daily rectally.  01/16/17  Yes [provider]  SPS 15 GM/60ML suspension Take 15 g once by mouth.  12/08/16  Yes [provider]  timolol (BETIMOL) 0.5 % ophthalmic  solution INSTILL 1 DROP INTO THE LEFT EYE DAILY 09/28/16  Yes Eulas Post, Brayton Layman, DO  feeding supplement, ENSURE ENLIVE, (ENSURE ENLIVE) LIQD Take 237 mLs by mouth 2 (two) times daily between meals. Patient not taking: Reported on 02/04/2017 12/11/16   Florencia Reasons, MD  JANUVIA 25 MG tablet TAKE ONE TABLET BY MOUTH DAILY Patient not taking: Reported on 02/04/2017 01/16/17   Gildardo Cranker, DO    Blood pressure (!) 177/37, pulse 61, temperature 98.3 F (36.8 C), temperature source Oral, resp. rate 18, height 5' (1.524 m), weight 115 lb 15.4 oz (52.6 kg), SpO2 98 %. Physical Exam: General: pleasant, frail white female who is laying in bed in NAD HEENT: head is normocephalic, atraumatic.  Sclera are noninjected.  Pupils equal and round.  Ears and nose without any masses or lesions.  Mouth is pink and moist. Dentition fair Heart: regular, rate, and rhythm.  No obvious murmurs, gallops, or rubs noted.  Palpable pedal pulses bilaterally Lungs: CTAB but diminished sounds at bilateral bases, no wheezes, rhonchi, or rales noted. Respiratory effort nonlabored Abd: well healed RLQ incision, soft, NT/ND, +BS, no masses, hernias, or organomegaly MS: all 4 extremities are symmetrical with no cyanosis, clubbing, or edema. Skin: warm and dry with no masses, lesions, or rashes Psych: Alert and oriented to self and situation. Some dementia at baseline (asked repetitive questions) Neuro: cranial nerves grossly intact, extremity CSM intact bilaterally, normal speech GU: no rectal prolapse currently  Results for orders placed or performed during the hospital encounter of 02/04/17 (from the past 48 hour(s))  Urinalysis, Routine w reflex microscopic- may I&O cath if menses     Status: Abnormal   Collection Time: 02/04/17  4:15 PM  Result Value Ref Range   Color, Urine YELLOW YELLOW    Comment: QUESTIONABLE RESULTS, RECOMMEND RECOLLECT TO VERIFY   APPearance (A) CLEAR    QUESTIONABLE RESULTS, RECOMMEND RECOLLECT TO VERIFY     Comment: CORRECTED ON 11/17 AT 1756: PREVIOUSLY REPORTED AS HAZY  Specific Gravity, Urine  1.005 - 1.030    QUESTIONABLE RESULTS, RECOMMEND RECOLLECT TO VERIFY    Comment: CORRECTED ON 11/17 AT 1756: PREVIOUSLY REPORTED AS 1.008   pH  5.0 - 8.0    QUESTIONABLE RESULTS, RECOMMEND RECOLLECT TO VERIFY    Comment: CORRECTED ON 11/17 AT 1756: PREVIOUSLY REPORTED AS 5.0   Glucose, UA (A) NEGATIVE mg/dL    QUESTIONABLE RESULTS, RECOMMEND RECOLLECT TO VERIFY    Comment: CORRECTED ON 11/17 AT 1756: PREVIOUSLY REPORTED AS NEGATIVE   Hgb urine dipstick (A) NEGATIVE    QUESTIONABLE RESULTS, RECOMMEND RECOLLECT TO VERIFY    Comment: CORRECTED ON 11/17 AT 1756: PREVIOUSLY REPORTED AS SMALL   Bilirubin Urine (A) NEGATIVE    QUESTIONABLE RESULTS, RECOMMEND RECOLLECT TO VERIFY    Comment: CORRECTED ON 11/17 AT 1756: PREVIOUSLY REPORTED AS NEGATIVE   Ketones, ur (A) NEGATIVE mg/dL    QUESTIONABLE RESULTS, RECOMMEND RECOLLECT TO VERIFY    Comment: CORRECTED ON 11/17 AT 1756: PREVIOUSLY REPORTED AS NEGATIVE   Protein, ur (A) NEGATIVE mg/dL    QUESTIONABLE RESULTS, RECOMMEND RECOLLECT TO VERIFY    Comment: CORRECTED ON 11/17 AT 1756: PREVIOUSLY REPORTED AS 100   Nitrite (A) NEGATIVE    QUESTIONABLE RESULTS, RECOMMEND RECOLLECT TO VERIFY    Comment: CORRECTED ON 11/17 AT 1756: PREVIOUSLY REPORTED AS NEGATIVE   Leukocytes, UA (A) NEGATIVE    QUESTIONABLE RESULTS, RECOMMEND RECOLLECT TO VERIFY    Comment: CORRECTED ON 11/17 AT 1756: PREVIOUSLY REPORTED AS MODERATE   Urine-Other      QUESTIONABLE RESULTS, RECOMMEND RECOLLECT TO VERIFY  Urinalysis, Routine w reflex microscopic     Status: Abnormal   Collection Time: 02/04/17  4:15 PM  Result Value Ref Range   Color, Urine YELLOW YELLOW   APPearance CLEAR CLEAR   Specific Gravity, Urine 1.008 1.005 - 1.030   pH 5.0 5.0 - 8.0   Glucose, UA NEGATIVE NEGATIVE mg/dL   Hgb urine dipstick SMALL (A) NEGATIVE   Bilirubin Urine NEGATIVE NEGATIVE   Ketones,  ur NEGATIVE NEGATIVE mg/dL   Protein, ur 100 (A) NEGATIVE mg/dL   Nitrite NEGATIVE NEGATIVE   Leukocytes, UA MODERATE (A) NEGATIVE   RBC / HPF 0-5 0 - 5 RBC/hpf   WBC, UA 6-30 0 - 5 WBC/hpf   Bacteria, UA MANY (A) NONE SEEN   Squamous Epithelial / LPF NONE SEEN NONE SEEN   WBC Clumps PRESENT    Hyaline Casts, UA PRESENT   Comprehensive metabolic panel     Status: Abnormal   Collection Time: 02/04/17  7:50 PM  Result Value Ref Range   Sodium 143 135 - 145 mmol/L   Potassium 4.7 3.5 - 5.1 mmol/L   Chloride 111 101 - 111 mmol/L   CO2 27 22 - 32 mmol/L   Glucose, Bld 104 (H) 65 - 99 mg/dL   BUN 32 (H) 6 - 20 mg/dL   Creatinine, Ser 2.05 (H) 0.44 - 1.00 mg/dL   Calcium 8.7 (L) 8.9 - 10.3 mg/dL   Total Protein 6.3 (L) 6.5 - 8.1 g/dL   Albumin 3.6 3.5 - 5.0 g/dL   AST 13 (L) 15 - 41 U/L   ALT 7 (L) 14 - 54 U/L   Alkaline Phosphatase 59 38 - 126 U/L   Total Bilirubin 0.5 0.3 - 1.2 mg/dL   GFR calc non Af Amer 21 (L) >60 mL/min   GFR calc Af Amer 24 (L) >60 mL/min    Comment: (NOTE) The eGFR has been calculated  using the CKD EPI equation. This calculation has not been validated in all clinical situations. eGFR's persistently <60 mL/min signify possible Chronic Kidney Disease.    Anion gap 5 5 - 15  CBC     Status: Abnormal   Collection Time: 02/04/17  7:50 PM  Result Value Ref Range   WBC 7.9 4.0 - 10.5 K/uL   RBC 3.57 (L) 3.87 - 5.11 MIL/uL   Hemoglobin 10.7 (L) 12.0 - 15.0 g/dL   HCT 33.2 (L) 36.0 - 46.0 %   MCV 93.0 78.0 - 100.0 fL   MCH 30.0 26.0 - 34.0 pg   MCHC 32.2 30.0 - 36.0 g/dL   RDW 13.9 11.5 - 15.5 %   Platelets 208 150 - 400 K/uL  Brain natriuretic peptide     Status: Abnormal   Collection Time: 02/04/17  7:50 PM  Result Value Ref Range   B Natriuretic Peptide 1,024.7 (H) 0.0 - 100.0 pg/mL  Glucose, capillary     Status: None   Collection Time: 02/05/17  1:16 AM  Result Value Ref Range   Glucose-Capillary 87 65 - 99 mg/dL  Troponin I (q 6hr x 3)      Status: Abnormal   Collection Time: 02/05/17  1:55 AM  Result Value Ref Range   Troponin I 0.03 (HH) <0.03 ng/mL    Comment: CRITICAL RESULT CALLED TO, READ BACK BY AND VERIFIED WITH: C SEAY,RN '@[id number]'  02/05/17 MKELLY   TSH     Status: None   Collection Time: 02/05/17  1:55 AM  Result Value Ref Range   TSH 2.485 0.350 - 4.500 uIU/mL    Comment: Performed by a 3rd Generation assay with a functional sensitivity of <=0.01 uIU/mL.  Hemoglobin A1c     Status: Abnormal   Collection Time: 02/05/17  1:55 AM  Result Value Ref Range   Hgb A1c MFr Bld 6.2 (H) 4.8 - 5.6 %    Comment: (NOTE) Pre diabetes:          5.7%-6.4% Diabetes:              >6.4% Glycemic control for   <7.0% adults with diabetes    Mean Plasma Glucose 131.24 mg/dL    Comment: Performed at [facility] [address], [place], [place] [address]  Troponin I (q 6hr x 3)     Status: Abnormal   Collection Time: 02/05/17  7:49 AM  Result Value Ref Range   Troponin I 0.03 (HH) <0.03 ng/mL    Comment: CRITICAL VALUE NOTED.  VALUE IS CONSISTENT WITH PREVIOUSLY REPORTED AND CALLED VALUE.  Basic metabolic panel     Status: Abnormal   Collection Time: 02/05/17  7:49 AM  Result Value Ref Range   Sodium 142 135 - 145 mmol/L   Potassium 3.5 3.5 - 5.1 mmol/L    Comment: DELTA CHECK NOTED   Chloride 109 101 - 111 mmol/L   CO2 22 22 - 32 mmol/L   Glucose, Bld 103 (H) 65 - 99 mg/dL   BUN 28 (H) 6 - 20 mg/dL   Creatinine, Ser 1.94 (H) 0.44 - 1.00 mg/dL   Calcium 8.4 (L) 8.9 - 10.3 mg/dL   GFR calc non Af Amer 22 (L) >60 mL/min   GFR calc Af Amer 26 (L) >60 mL/min    Comment: (NOTE) The eGFR has been calculated using the CKD EPI equation. This calculation has not been validated in all clinical situations. eGFR's persistently <60 mL/min signify possible Chronic Kidney Disease.  Anion gap 11 5 - 15  CBC with Differential/Platelet     Status: Abnormal   Collection Time: 02/05/17  7:49 AM  Result Value Ref Range   WBC 7.6  4.0 - 10.5 K/uL   RBC 3.61 (L) 3.87 - 5.11 MIL/uL   Hemoglobin 10.8 (L) 12.0 - 15.0 g/dL   HCT 33.6 (L) 36.0 - 46.0 %   MCV 93.1 78.0 - 100.0 fL   MCH 29.9 26.0 - 34.0 pg   MCHC 32.1 30.0 - 36.0 g/dL   RDW 14.1 11.5 - 15.5 %   Platelets 166 150 - 400 K/uL   Neutrophils Relative % 76 %   Neutro Abs 5.8 1.7 - 7.7 K/uL   Lymphocytes Relative 14 %   Lymphs Abs 1.1 0.7 - 4.0 K/uL   Monocytes Relative 7 %   Monocytes Absolute 0.5 0.1 - 1.0 K/uL   Eosinophils Relative 3 %   Eosinophils Absolute 0.2 0.0 - 0.7 K/uL   Basophils Relative 0 %   Basophils Absolute 0.0 0.0 - 0.1 K/uL  Magnesium     Status: None   Collection Time: 02/05/17  7:49 AM  Result Value Ref Range   Magnesium 1.9 1.7 - 2.4 mg/dL  Glucose, capillary     Status: Abnormal   Collection Time: 02/05/17  8:16 AM  Result Value Ref Range   Glucose-Capillary 100 (H) 65 - 99 mg/dL  Glucose, capillary     Status: Abnormal   Collection Time: 02/05/17 11:34 AM  Result Value Ref Range   Glucose-Capillary 114 (H) 65 - 99 mg/dL  Troponin I (q 6hr x 3)     Status: Abnormal   Collection Time: 02/05/17  1:44 PM  Result Value Ref Range   Troponin I 0.04 (HH) <0.03 ng/mL    Comment: CRITICAL VALUE NOTED.  VALUE IS CONSISTENT WITH PREVIOUSLY REPORTED AND CALLED VALUE.  Glucose, capillary     Status: Abnormal   Collection Time: 02/05/17  4:32 PM  Result Value Ref Range   Glucose-Capillary 103 (H) 65 - 99 mg/dL  Glucose, capillary     Status: Abnormal   Collection Time: 02/05/17  9:59 PM  Result Value Ref Range   Glucose-Capillary 110 (H) 65 - 99 mg/dL  Basic metabolic panel     Status: Abnormal   Collection Time: 02/06/17  5:28 AM  Result Value Ref Range   Sodium 139 135 - 145 mmol/L   Potassium 3.7 3.5 - 5.1 mmol/L   Chloride 107 101 - 111 mmol/L   CO2 23 22 - 32 mmol/L   Glucose, Bld 84 65 - 99 mg/dL   BUN 29 (H) 6 - 20 mg/dL   Creatinine, Ser 2.10 (H) 0.44 - 1.00 mg/dL   Calcium 8.2 (L) 8.9 - 10.3 mg/dL   GFR calc non Af  Amer 20 (L) >60 mL/min   GFR calc Af Amer 23 (L) >60 mL/min    Comment: (NOTE) The eGFR has been calculated using the CKD EPI equation. This calculation has not been validated in all clinical situations. eGFR's persistently <60 mL/min signify possible Chronic Kidney Disease.    Anion gap 9 5 - 15  Glucose, capillary     Status: None   Collection Time: 02/06/17  7:55 AM  Result Value Ref Range   Glucose-Capillary 76 65 - 99 mg/dL   Dg Chest 2 View  Result Date: 02/04/2017 CLINICAL DATA:  Bladder and rectal pain. EXAM: CHEST  2 VIEW COMPARISON:  December 09, 2016 FINDINGS: Stable  cardiomegaly. The hila and mediastinum are stable. Chronic interstitial opacities remain. No new focal infiltrate. No nodule or mass. No pneumothorax. No other acute abnormalities. IMPRESSION: 1. Chronic interstitial changes are stable.  No acute abnormalities. Electronically Signed   By: Dorise Bullion III M.D   On: 02/04/2017 18:05   Dg Abdomen 1 View  Result Date: 02/04/2017 CLINICAL DATA:  Bladder and rectal pain. EXAM: ABDOMEN - 1 VIEW COMPARISON:  February 01, 2017 FINDINGS: The lung bases are not well assessed. No evidence of bowel obstruction. A calcification the pelvis was noted to be uterine on previous CT imaging. No renal or ureteral stones. A vascular stent is identified. No other acute abnormalities. IMPRESSION: 1. No acute abnormalities to explain the patient's symptoms identified. The lung bases are not well visualized. Electronically Signed   By: Dorise Bullion III M.D   On: 02/04/2017 18:04   Ct Chest Wo Contrast  Result Date: 02/05/2017 CLINICAL DATA:  Hypoxia EXAM: CT CHEST WITHOUT CONTRAST TECHNIQUE: Multidetector CT imaging of the chest was performed following the standard protocol without IV contrast. COMPARISON:  Chest x-ray 02/04/2017 FINDINGS: Cardiovascular: There is cardiomegaly. Diffuse coronary artery and aortic calcifications. No evidence of aortic aneurysm. Mediastinum/Nodes:  Mildly enlarged mediastinal lymph nodes. Precarinal lymph node has a short axis diameter 15 mm. Other small borderline scratched at other small and borderline sized mediastinal lymph nodes. No visible hilar adenopathy. No axillary adenopathy. Lungs/Pleura: Small bilateral pleural effusions. There is mild interstitial prominence best seen in the mid and lower lungs, question early interstitial edema. Bibasilar atelectasis. No confluent opacities. Upper Abdomen: Imaging into the upper abdomen shows no acute findings. Musculoskeletal: Chest wall soft tissues are unremarkable. No acute bony abnormality. IMPRESSION: Cardiomegaly, severe/diffuse coronary artery disease and aortic atherosclerosis. Small bilateral effusions with mild interstitial prominence in the mid and lower lung zones, question early interstitial edema. Bibasilar atelectasis. Electronically Signed   By: Rolm Baptise M.D.   On: 02/05/2017 08:39   Anti-infectives (From admission, onward)   Start     Dose/Rate Route Frequency Ordered Stop   02/05/17 2000  cefTRIAXone (ROCEPHIN) 1 g in dextrose 5 % 50 mL IVPB     1 g 100 mL/hr over 30 Minutes Intravenous Every 24 hours 02/05/17 0123     02/04/17 1845  cefTRIAXone (ROCEPHIN) 1 g in dextrose 5 % 50 mL IVPB     1 g 100 mL/hr over 30 Minutes Intravenous  Once 02/04/17 1844 02/04/17 2056        Assessment/Plan DM HTN HLD CKD-IV H/o DVT CAD, LBBB PAD Elevated cardiac enzymes Acute on chronic diastolic HF - EF 99/24% on ECHO 11/18 UTI  Rectal prolapse - patient reports 7-8 episodes over the last year - currently rectum is not prolapsed  ID - rocephin 11/17>> VTE - SCDs, lovenox FEN - carb modified diet  Plan - Patient would be very high risk for surgical intervention, would not recommend this elective surgery. Will await cardiology recommendations. I would recommend patient work with physical therapy specifically for pelvic floor strengthening. If/when the rectum prolapses  again, recommend ice, tucks pads, bed rest, and hydrocortisone cream.  Wellington Hampshire, [facility] 02/06/2017, 10:21 AM Pager: [phone] Consults: [phone] Mon-Fri 7:00 am-4:30 pm Sat-Sun 7:00 am-11:30 am

## 2017-02-06 NOTE — Consult Note (Signed)
Cardiology Consultation:   Patient ID: Ashley RheinDoris E Fannin; [id number]; 12/20/1927   Admit date: 02/04/2017 Date of Consult: 02/06/2017  Primary Care Provider: Kirt Boysarter, Monica, DO Primary Cardiologist: No primary care provider on file. Dr Excell Seltzerooper L. Tyrone SageGerhardt, NP Primary Electrophysiologist:  n/a   Patient Profile:   [name] is a 83 y.o. female with a hx of BMS RCA and DES Diag 03/2010. DM, HTN, HLD, D-CHF w/ EF 55-60% per cath 2012 (35% in 2000) , PVD (Dr. Darrick PennaFields) w/ bilateral CIA stenting 12/2013, gout, CKD III, chronic anemia, LBBB.   She was admitted 11/17 w/ rectal pain and constipation. She also had volume overload. Because of the rectal pain, she may need surgery if there is prolapse. She is being seen today for the evaluation of CHF and possible preop at the request of Dr Janee Mornhompson.  History of Present Illness:   [name] denies a past history of CHF and CAD. Denies SOB, CP, and/or peripheral edema. She remains asymptomatic at this time. Upon initial discussion, she admits to a cough prior to admssion, although in speaking further, this was then denied. History is limited due to pt's baseline dementia. Family is at bedside and is assisting with history recall.  [name]'s main complaint continues to be rectal and perirenal pain in which she is being followed by Dr. Janee Mornhompson with Internal Medicine and surgical consult.       Past Medical History:  Diagnosis Date  . Anemia   . Anxiety disorder   . Carotid artery occlusion   . Carotid bruit   . Chronic kidney disease (CKD), stage III (moderate) (HCC)   . Coronary artery disease    Remote PCI. Had BMS to RCA and DES stent to Diagonal in Jan. 2012   . Diastolic heart failure    EF 55 to 60% per cath in Jan 2012  . DVT (deep venous thrombosis) (HCC)   . Gout   . Hyperlipidemia   . Hypertension   . Ischemic cardiomyopathy    with prior EF of 35%  . NSTEMI (non-ST elevated myocardial infarction) [facility](HCC) Jan 2012   with PCI  to RCA and DX per Dr. Excell Seltzerooper  . Obesity   . Pneumonia 2016  . Proteinuria   . Stroke [facility](HCC) 2013   ?  mini    . TIA (transient ischemic attack)   . Type II diabetes mellitus (HCC)    long standing    Past Surgical History:  Procedure Laterality Date  . ABDOMINAL AORTAGRAM N/A 01/17/2014   Performed by Sherren KernsFields, [name], [facility]  . ANGIOGRAM EXTREMITY BILATERAL Bilateral 01/17/2014   [facility]ernsFields[facility] [name][facility]  . ANKLE FUSION Bilateral   . APPENDECTOMY    . CATARACT EXTRACTION, BILATERAL Bilateral   . CORONARY ANGIOPLASTY WITH STENT PLACEMENT  04/05/2010   distal RCA  . FRACTURE SURGERY Right 2005   bilateral ankles   . PERCUTANEOUS STENT INTERVENTION Bilateral 01/17/2014   Performed [facility] Sherren KernsFields, [name][facility]  . TONSILLECTOMY       Home Medications:  Prior to Admission medications   Medication Sig Start Date End Date Taking? Authorizing Provider  aspirin EC 81 MG tablet Take 1 tablet (81 mg total) by mouth daily. 12/11/16  Yes Albertine GratesXu, Fang, MD  BYSTOLIC 20 MG TABS TAKE 1 TABLET (20 MG TOTAL) BY MOUTH DAILY. FOR BLOOD PRESSURE 12/26/16  Yes Sharon SellerEubanks, Jessica K, NP  cloNIDine (CATAPRES)  0.1 MG tablet Take 1 tablet (0.1 mg total) by mouth daily. For high blood pressure 09/28/16  Yes Montez Morita, Monica, DO  docusate sodium (COLACE) 100 MG capsule Take 1 capsule (100 mg total) by mouth every 12 (twelve) hours. 01/09/17  Yes Ward, Layla Maw, DO  ezetimibe-simvastatin (VYTORIN) 10-20 MG tablet TAKE 1/2 TABLET BY MOUTH ONCE NIGHTLY TO CONTROL CHOLESTEROL 01/19/17  Yes Montez Morita, Monica, DO  furosemide (LASIX) 20 MG tablet TAKE 1 TABLET BY MOUTH TWICE DAILY FOR HYPERTENSION 01/16/17  Yes Kirt Boys, DO  hydrocortisone (ANUSOL-HC) 2.5 % rectal cream Place 1 application 2 (two) times daily rectally. 02/01/17  Yes McDonald, Mia A, PA-C  JANUVIA 100 MG tablet Take 100 mg daily by mouth.  11/15/16  Yes [provider]  losartan (COZAAR) 50 MG tablet  Take 50 mg daily by mouth.  11/15/16  Yes [provider]  Loteprednol Etabonate (LOTEMAX) 0.5 % GEL Place 1 drop into the left eye daily. PLACE 1 DROP IN THE LEFT EYE TWICE DAILY 01/19/17  Yes Kirt Boys, DO  meloxicam (MOBIC) 15 MG tablet Take 15 mg daily by mouth.  01/16/17  Yes [provider]  nitroGLYCERIN (NITROSTAT) 0.4 MG SL tablet Place one under tongue for chest pain, up to 3 tablets 09/28/16  Yes Montez Morita, Monica, DO  polyethylene glycol powder (GLYCOLAX/MIRALAX) powder Take 17 g daily by mouth.  01/16/17  Yes [provider]  PROCTOZONE-HC 2.5 % rectal cream Place 1 application 2 (two) times daily rectally.  01/16/17  Yes [provider]  SPS 15 GM/60ML suspension Take 15 g once by mouth.  12/08/16  Yes [provider]  timolol (BETIMOL) 0.5 % ophthalmic solution INSTILL 1 DROP INTO THE LEFT EYE DAILY 09/28/16  Yes Montez Morita, Maxine Glenn, DO  feeding supplement, ENSURE ENLIVE, (ENSURE ENLIVE) LIQD Take 237 mLs by mouth 2 (two) times daily between meals. Patient not taking: Reported on 02/04/2017 12/11/16   Albertine Grates, MD  JANUVIA 25 MG tablet TAKE ONE TABLET BY MOUTH DAILY Patient not taking: Reported on 02/04/2017 01/16/17   Kirt Boys, DO    Inpatient Medications: Scheduled Meds: . aspirin EC  81 mg Oral Daily  . cloNIDine  0.1 mg Oral Daily  . enoxaparin (LOVENOX) injection  30 mg Subcutaneous Q24H  . ezetimibe-simvastatin  0.5 tablet Oral q1800  . furosemide  40 mg Intravenous Daily  . hydrocortisone   Rectal TID  . insulin aspart  0-5 Units Subcutaneous QHS  . insulin aspart  0-9 Units Subcutaneous TID WC  . nebivolol  20 mg Oral Daily  . senna-docusate  1 tablet Oral QHS  . sodium chloride flush  3 mL Intravenous Q12H  . timolol  1 drop Left Eye Daily   Continuous Infusions: . sodium chloride    . cefTRIAXone (ROCEPHIN)  IV Stopped (02/05/17 2121)   PRN Meds: sodium chloride, acetaminophen, morphine injection, ondansetron  (ZOFRAN) IV, sodium chloride flush, traMADol  Allergies:    Allergies  Allergen Reactions  . Namzaric [Memantine Hcl-Donepezil Hcl] Nausea Only    confusion  . Percocet [Oxycodone-Acetaminophen] Other (See Comments)    loosy goosy  . Sulfa Antibiotics Other (See Comments)    Tongue swells  . Adhesive [Tape] Other (See Comments)    Tears skin.  Please use "paper" tape  . Metformin And Related Diarrhea    Abnormal Kidney Functions   . Tradjenta [Linagliptin]     Low back ache, resolved once medication stopped   . Penicillins Hives  Tolerates Ceftriaxone, Has patient had a PCN reaction causing immediate rash, facial/tongue/throat swelling, SOB or lightheadedness with hypotension: Unknown Has patient had a PCN reaction causing severe rash involving mucus membranes or skin necrosis: No Has patient had a PCN reaction that required hospitalization No Has patient had a PCN reaction occurring within the last 10 years: No If all of the above answers are "NO", then may proceed with Cephalosporin use.     Social History:   Social History   Socioeconomic History  . Marital status: Widowed    Spouse name: Not on file  . Number of children: Not on file  . Years of education: Not on file  . Highest education level: Not on file  Social Needs  . Financial resource strain: Not on file  . Food insecurity - worry: Not on file  . Food insecurity - inability: Not on file  . Transportation needs - medical: Not on file  . Transportation needs - non-medical: Not on file  Occupational History  . Occupation: retired OR Engineer, civil (consulting)  Tobacco Use  . Smoking status: Former Games developer  . Smokeless tobacco: Never Used  . Tobacco comment: "smoked in my teens; quit after 1 year or so"  Substance and Sexual Activity  . Alcohol use: No    Alcohol/week: 0.0 oz  . Drug use: No  . Sexual activity: No  Other Topics Concern  . Not on file  Social History Narrative   Widow   Former smoker   Alcohol none   No  Advance Directive    Family History:    Family History  Problem Relation Age of Onset  . Diabetes Daughter   . CAD Neg Hx      ROS:  Please see the history of present illness.  Review of Systems  Constitution: Negative for decreased appetite, weakness and weight gain.  Cardiovascular: Negative for chest pain, cyanosis, dyspnea on exertion, irregular heartbeat, leg swelling, orthopnea, palpitations and syncope.  Respiratory: Negative for cough, shortness of breath, sleep disturbances due to breathing, sputum production and wheezing.   Gastrointestinal: Positive for constipation. Negative for bloating and abdominal pain.  Genitourinary: Positive for genital sores.        Physical Exam/Data:   Vitals:   02/05/17 2054 02/05/17 2200 02/05/17 2202 02/06/17 0642  BP: (!) 191/53 (!) 145/32 (!) 115/44 (!) 177/37  Pulse: 66 62  61  Resp: 18     Temp: (!) 100.4 F (38 C)   98.3 F (36.8 C)  TempSrc: Oral   Oral  SpO2: 94%  93% 98%  Weight:    115 lb 15.4 oz (52.6 kg)  Height:        Intake/Output Summary (Last 24 hours) at 02/06/2017 0825 Last data filed at 02/06/2017 0600 Gross per 24 hour  Intake 50 ml  Output -  Net 50 ml   Filed Weights   02/04/17 1954 02/05/17 0121 02/06/17 0642  Weight: 125 lb (56.7 kg) 121 lb 0.5 oz (54.9 kg) 115 lb 15.4 oz (52.6 kg)   Body mass index is 22.65 kg/m.  General: Alert and Oriented to self and situation. Mild dementia noted. Well nourished, well developed, in no acute distress HEENT: Symmetric, non traumatic,  PERRLA Lymph: No adenopathy Neck: Neck supple, No JVD Endocrine:  No thryomegaly Vascular: No carotid bruits; Radial and DP pulses 2+ bilaterally, no edema Cardiac:  Normal S1, S2; RRR; no murmur Lungs: Positive rhonchi bilateral bases R>L. No wheezing, rhonchi or rales  Abd: Soft, non  distended, nontender, + BS x4  Skin: Warm, dry, intact. Bilateral ecchymosis to upper extremities   Neuro:  CNs 2-12 grossly intact, no focal  abnormalities noted, MAEx4 equally Psych:  Normal affect, mild dementia noted.    EKG:  The EKG was personally reviewed and demonstrates:  Sinus with LBBB Telemetry:  Telemetry was personally reviewed and demonstrates:  Sinus with 4 beats NSVT  Relevant CV Studies: Echo p[erformed 01/2017 Study Conclusions - Left ventricle: The cavity size was normal. Wall thickness was   normal. Systolic function was mildly reduced. The estimated   ejection fraction was in the range of 45% to 50%. Hypokinesis of   the basal-midinferior and inferoseptal myocardium; consistent   with ischemia in the distribution of the right coronary artery.   Features are consistent with a pseudonormal left ventricular   filling pattern, with concomitant abnormal relaxation and   increased filling pressure (grade 2 diastolic dysfunction). - Mitral valve: Calcified annulus. There was mild regurgitation. - Left atrium: The atrium was severely dilated. - Pulmonary arteries: PA peak pressure: 31 mm Hg (S).  Laboratory Data:  Chemistry Recent Labs  Lab 02/04/17 1950 02/05/17 0749 02/06/17 0528  NA 143 142 139  K 4.7 3.5 3.7  CL 111 109 107  CO2 27 22 23   GLUCOSE 104* 103* 84  BUN 32* 28* 29*  CREATININE 2.05* 1.94* 2.10*  CALCIUM 8.7* 8.4* 8.2*  GFRNONAA 21* 22* 20*  GFRAA 24* 26* 23*  ANIONGAP 5 11 9     Recent Labs  Lab 02/01/17 2146 02/04/17 1950  PROT 6.1* 6.3*  ALBUMIN 3.3* 3.6  AST 15 13*  ALT 9* 7*  ALKPHOS 59 59  BILITOT 0.5 0.5   Hematology Recent Labs  Lab 02/01/17 2146 02/04/17 1950 02/05/17 0749  WBC 6.0 7.9 7.6  RBC 3.66* 3.57* 3.61*  HGB 10.8* 10.7* 10.8*  HCT 34.2* 33.2* 33.6*  MCV 93.4 93.0 93.1  MCH 29.5 30.0 29.9  MCHC 31.6 32.2 32.1  RDW 14.0 13.9 14.1  PLT 200 208 166   Cardiac Enzymes Recent Labs  Lab 02/05/17 0155 02/05/17 0749 02/05/17 1344  TROPONINI 0.03* 0.03* 0.04*   No results for input(s): TROPIPOC in the last 168 hours.  BNP Recent Labs  Lab  02/04/17 1950  BNP 1,024.7*    DDimer No results for input(s): DDIMER in the last 168 hours.  Radiology/Studies:  Dg Chest 2 View  Result Date: 02/04/2017 CLINICAL DATA:  Bladder and rectal pain. EXAM: CHEST  2 VIEW COMPARISON:  December 09, 2016 FINDINGS: Stable cardiomegaly. The hila and mediastinum are stable. Chronic interstitial opacities remain. No new focal infiltrate. No nodule or mass. No pneumothorax. No other acute abnormalities. IMPRESSION: 1. Chronic interstitial changes are stable.  No acute abnormalities. Electronically Signed   By: Gerome Sam III M.D   On: 02/04/2017 18:05   Dg Abdomen 1 View  Result Date: 02/04/2017 CLINICAL DATA:  Bladder and rectal pain. EXAM: ABDOMEN - 1 VIEW COMPARISON:  February 01, 2017 FINDINGS: The lung bases are not well assessed. No evidence of bowel obstruction. A calcification the pelvis was noted to be uterine on previous CT imaging. No renal or ureteral stones. A vascular stent is identified. No other acute abnormalities. IMPRESSION: 1. No acute abnormalities to explain the patient's symptoms identified. The lung bases are not well visualized. Electronically Signed   By: Gerome Sam III M.D   On: 02/04/2017 18:04   Ct Chest Wo Contrast  Result Date: 02/05/2017 CLINICAL DATA:  Hypoxia EXAM: CT CHEST WITHOUT CONTRAST TECHNIQUE: Multidetector CT imaging of the chest was performed following the standard protocol without IV contrast. COMPARISON:  Chest x-ray 02/04/2017 FINDINGS: Cardiovascular: There is cardiomegaly. Diffuse coronary artery and aortic calcifications. No evidence of aortic aneurysm. Mediastinum/Nodes: Mildly enlarged mediastinal lymph nodes. Precarinal lymph node has a short axis diameter 15 mm. Other small borderline scratched at other small and borderline sized mediastinal lymph nodes. No visible hilar adenopathy. No axillary adenopathy. Lungs/Pleura: Small bilateral pleural effusions. There is mild interstitial prominence  best seen in the mid and lower lungs, question early interstitial edema. Bibasilar atelectasis. No confluent opacities. Upper Abdomen: Imaging into the upper abdomen shows no acute findings. Musculoskeletal: Chest wall soft tissues are unremarkable. No acute bony abnormality. IMPRESSION: Cardiomegaly, severe/diffuse coronary artery disease and aortic atherosclerosis. Small bilateral effusions with mild interstitial prominence in the mid and lower lung zones, question early interstitial edema. Bibasilar atelectasis. Electronically Signed   By: Charlett NoseKevin  Dover M.D.   On: 02/05/2017 08:39    Assessment and Plan:  1. Acute on Chronic Diastolic CHF -Repeat CXR 02/05/17 from admission stable, no need to repeat at this time.  -Recheck BNP for fluid status evaluation in AM -Continue Lasix 40mg  IV daily?? -Add fluid and Na+ restrict diet -Weight currently  down 10Lb from admission  -Echo completed 02/05/17- see report above  2. Hyperlipidemia -Continue vytorin  3. DM -SSI coverage  4. HTN -Continue clonidine and bystolic  2. Rectal pain/constipation -Surgery currently consulting for possible surgical evaluation.   5. Rectal pain/constipation -Surgery currently consulting for possible surgical evaluation.   For questions or updates, please contact [facility] Please consult [email or web address] for contact info under Cardiology/STEMI.   Signed, Theodore DemarkRhonda Barrett, PA-C  02/06/2017 8:25 AM   As above, patient seen and examined. Briefly she is an 83 year old female with past medical history of coronary artery disease, diabetes mellitus, hypertension, chronic diastolic congestive heart failure, chronic stage III kidney disease, dementia for evaluation of acute on chronic diastolic congestive heart failure. Patient presented to the emergency room with complaints of rectal pain and rectal prolapse. By report her oxygen saturation was in the low 90s. She was felt to have congestive heart failure and cardiology  asked to evaluate. Patient denies dyspnea, chest pain, palpitations or syncope. No productive cough. Chest CT showed small bilateral effusions and possible interstitial edema. Echo shows ejection fraction 45-50%, mild mitral regurgitation and severe left atrial enlargement. BNP 1024. Troponin is 03 and 0.04. Initial creatinine 1.84. 1 acute on chronic diastolic congestive heart failure-patient is essentially asymptomatic with no complaints of dyspnea. She is not monitoring overloaded on examination. Would resume home dose of Lasix at 20 mg twice a day at discharge.  2 mildly reduced LV function-patient is not having chest pain and has multiple comorbidities. Would not pursue further ischemia evaluation at this point. Would continue aspirin, beta blocker and statin.  3 minimally elevated troponin-no clear trend. No further ischemia evaluation. Not consistent with acute coronary syndrome.  4 coronary artery disease-continue aspirin and statin. No chest pain.  5 rectal prolapse-management per primary service.  We will sign off. Please call with questions.  Olga MillersBrian Tad Fancher, MD

## 2017-02-06 NOTE — Progress Notes (Signed)
PROGRESS NOTE    [name]  ZOX:[id number] DOB: June 22, 1927 DOA: 02/04/2017 PCP: Kirt Boys, DO    Brief Narrative:  Patient is a 83 year old female with history of type 2 diabetes, hypertension, chronic kidney disease stage IV, chronic diastolic heart failure, history of DVT, peripheral artery disease who had presented with some rectal plane after having a bowel movement with difficulty urinating.  Patient also noted to be hypoxic and admitted for probable acute on chronic CHF exacerbation.  Patient also noted to have a rectal prolapse.  2D echo was abnormal.  General surgery and cardiology consulted.   Assessment & Plan:   Principal Problem:   Acute on chronic diastolic CHF (congestive heart failure) (HCC) Active Problems:   Coronary artery disease   Hyperlipidemia   Hypertension   Diabetes mellitus with nephropathy (HCC)   PAD (peripheral artery disease) (HCC)   Rectal prolapse   Hemorrhoids   Hypoxia   Abnormal CXR   Accelerated hypertension   LBBB (left bundle branch block)   Urinary tract infection without hematuria  #1 acute on chronic diastolic heart failure Patient presented with hypoxic concern for acute on chronic diastolic heart failure.  Chest x-ray and CT chest worrisome for volume overload.  Cardiac enzymes minimally elevated.  2D echo with a EF of 45-50% with wall motion abnormalities.  Continue IV Lasix.  Due to wall motion abnormalities noted on 2D echo and CHF exacerbation we will consult with cardiology for further evaluation and management.  Strict I's and O's.  Daily weights.  2.  Diarrhea Patient noted to have multiple loose stools per nursing.  Rectal prolapse.   3.  HTN Continue clonidine, Lasix, bystolic  4.  Hypertension Continue statin.  5.  Diabetes mellitus with nephropathy Hemoglobin A1c 6.2.  CBGs ranging from 76-110.  Continue sliding scale insulin.  6.  Coronary artery disease/left bundle branch block Cardiac enzymes minimally  elevated.  2D echo with EF of 45-50%, hypokinesis of basal mid inferior and inferior septal myocardium consistent with ischemia in the distribution of the RCA.  Grade 2 diastolic dysfunction.  Continue aspirin, beta-blocker, statin, aspirin.  Due to abnormal 2D echo and volume overload cardiology has been consulted.  Follow.  #7 chronic kidney disease stage IV Baseline approximately creatinine of 1.84.  Creatinine currently at 2.10.   #8 UTI Urine cultures pending.  IV Rocephin.  #9 rectal prolapse hydrortisone cream.  Sitz baths.  Consult with general surgery for further evaluation and management.     DVT prophylaxis: Lovenox Code Status: Full Family Communication: Updated patient and friend at bedtime. Disposition Plan: To be determined.  Likely home in the next 24-48 hours pending surgical evaluation.   Consultants:   Cardiology: Dr. Jens Som 02/06/2017  General surgery pending 02/06/2017  Procedures:   2D echo 02/05/2017 pending  CT chest without contrast 02/05/2017  Abdominal x-ray 02/04/2017  Chest x-ray 02/04/2017  Antimicrobials:   IV Rocephin 02/04/2017   Subjective: Patient laying down.  She denies any shortness of breath.  Patient denies any chest pain.  States rectal pain with some improvement after hydrocortisone cream.    Objective: Vitals:   02/05/17 2054 02/05/17 2200 02/05/17 2202 02/06/17 0642  BP: (!) 191/53 (!) 145/32 (!) 115/44 (!) 177/37  Pulse: 66 62  61  Resp: 18     Temp: (!) 100.4 F (38 C)   98.3 F (36.8 C)  TempSrc: Oral   Oral  SpO2: 94%  93% 98%  Weight:    52.6 kg (  115 lb 15.4 oz)  Height:        Intake/Output Summary (Last 24 hours) at 02/06/2017 1136 Last data filed at 02/06/2017 0600 Gross per 24 hour  Intake 50 ml  Output -  Net 50 ml   Filed Weights   02/04/17 1954 02/05/17 0121 02/06/17 0642  Weight: 56.7 kg (125 lb) 54.9 kg (121 lb 0.5 oz) 52.6 kg (115 lb 15.4 oz)    Examination:  General exam:  NAD Respiratory system: Diffuse bibasilar crackles. No wheezing. Respiratory effort normal. Cardiovascular system: S1 & S2 heard, RRR. No JVD, murmurs, rubs, gallops or clicks. No pedal edema. Gastrointestinal system: Abdomen is nondistended, soft and nontender. No organomegaly or masses felt. Normal bowel sounds heard. Central nervous system: Alert and oriented. No focal neurological deficits. Extremities: Symmetric 5 x 5 power. Skin: No rashes, lesions or ulcers Psychiatry: Judgement and insight appear normal. Mood & affect appropriate.     Data Reviewed: I have personally reviewed following labs and imaging studies  CBC: Recent Labs  Lab 02/01/17 2146 02/04/17 1950 02/05/17 0749  WBC 6.0 7.9 7.6  NEUTROABS  --   --  5.8  HGB 10.8* 10.7* 10.8*  HCT 34.2* 33.2* 33.6*  MCV 93.4 93.0 93.1  PLT 200 208 166   Basic Metabolic Panel: Recent Labs  Lab 02/01/17 2146 02/04/17 1950 02/05/17 0749 02/06/17 0528  NA 141 143 142 139  K 3.9 4.7 3.5 3.7  CL 109 111 109 107  CO2 26 27 22 23   GLUCOSE 98 104* 103* 84  BUN 28* 32* 28* 29*  CREATININE 1.84* 2.05* 1.94* 2.10*  CALCIUM 8.6* 8.7* 8.4* 8.2*  MG  --   --  1.9  --    GFR: Estimated Creatinine Clearance: 13.6 mL/min (A) (by C-G formula based on SCr of 2.1 mg/dL (H)). Liver Function Tests: Recent Labs  Lab 02/01/17 2146 02/04/17 1950  AST 15 13*  ALT 9* 7*  ALKPHOS 59 59  BILITOT 0.5 0.5  PROT 6.1* 6.3*  ALBUMIN 3.3* 3.6   No results for input(s): LIPASE, AMYLASE in the last 168 hours. No results for input(s): AMMONIA in the last 168 hours. Coagulation Profile: No results for input(s): INR, PROTIME in the last 168 hours. Cardiac Enzymes: Recent Labs  Lab 02/05/17 0155 02/05/17 0749 02/05/17 1344  TROPONINI 0.03* 0.03* 0.04*   BNP (last 3 results) No results for input(s): PROBNP in the last 8760 hours. HbA1C: Recent Labs    02/05/17 0155  HGBA1C 6.2*   CBG: Recent Labs  Lab 02/05/17 0816  02/05/17 1134 02/05/17 1632 02/05/17 2159 02/06/17 0755  GLUCAP 100* 114* 103* 110* 76   Lipid Profile: No results for input(s): CHOL, HDL, LDLCALC, TRIG, CHOLHDL, LDLDIRECT in the last 72 hours. Thyroid Function Tests: Recent Labs    02/05/17 0155  TSH 2.485   Anemia Panel: No results for input(s): VITAMINB12, FOLATE, FERRITIN, TIBC, IRON, RETICCTPCT in the last 72 hours. Sepsis Labs: No results for input(s): PROCALCITON, LATICACIDVEN in the last 168 hours.  No results found for this or any previous visit (from the past 240 hour(s)).       Radiology Studies: Dg Chest 2 View  Result Date: 02/04/2017 CLINICAL DATA:  Bladder and rectal pain. EXAM: CHEST  2 VIEW COMPARISON:  December 09, 2016 FINDINGS: Stable cardiomegaly. The hila and mediastinum are stable. Chronic interstitial opacities remain. No new focal infiltrate. No nodule or mass. No pneumothorax. No other acute abnormalities. IMPRESSION: 1. Chronic interstitial changes are stable.  No acute abnormalities. Electronically Signed   By: Gerome Samavid  Williams III M.D   On: 02/04/2017 18:05   Dg Abdomen 1 View  Result Date: 02/04/2017 CLINICAL DATA:  Bladder and rectal pain. EXAM: ABDOMEN - 1 VIEW COMPARISON:  February 01, 2017 FINDINGS: The lung bases are not well assessed. No evidence of bowel obstruction. A calcification the pelvis was noted to be uterine on previous CT imaging. No renal or ureteral stones. A vascular stent is identified. No other acute abnormalities. IMPRESSION: 1. No acute abnormalities to explain the patient's symptoms identified. The lung bases are not well visualized. Electronically Signed   By: Gerome Samavid  Williams III M.D   On: 02/04/2017 18:04   Ct Chest Wo Contrast  Result Date: 02/05/2017 CLINICAL DATA:  Hypoxia EXAM: CT CHEST WITHOUT CONTRAST TECHNIQUE: Multidetector CT imaging of the chest was performed following the standard protocol without IV contrast. COMPARISON:  Chest x-ray 02/04/2017 FINDINGS:  Cardiovascular: There is cardiomegaly. Diffuse coronary artery and aortic calcifications. No evidence of aortic aneurysm. Mediastinum/Nodes: Mildly enlarged mediastinal lymph nodes. Precarinal lymph node has a short axis diameter 15 mm. Other small borderline scratched at other small and borderline sized mediastinal lymph nodes. No visible hilar adenopathy. No axillary adenopathy. Lungs/Pleura: Small bilateral pleural effusions. There is mild interstitial prominence best seen in the mid and lower lungs, question early interstitial edema. Bibasilar atelectasis. No confluent opacities. Upper Abdomen: Imaging into the upper abdomen shows no acute findings. Musculoskeletal: Chest wall soft tissues are unremarkable. No acute bony abnormality. IMPRESSION: Cardiomegaly, severe/diffuse coronary artery disease and aortic atherosclerosis. Small bilateral effusions with mild interstitial prominence in the mid and lower lung zones, question early interstitial edema. Bibasilar atelectasis. Electronically Signed   By: Charlett NoseKevin  Dover M.D.   On: 02/05/2017 08:39        Scheduled Meds: . aspirin EC  81 mg Oral Daily  . cloNIDine  0.1 mg Oral Daily  . enoxaparin (LOVENOX) injection  30 mg Subcutaneous Q24H  . ezetimibe-simvastatin  0.5 tablet Oral q1800  . [START ON 02/07/2017] furosemide  20 mg Oral BID  . hydrocortisone   Rectal TID  . insulin aspart  0-5 Units Subcutaneous QHS  . insulin aspart  0-9 Units Subcutaneous TID WC  . nebivolol  20 mg Oral Daily  . senna-docusate  1 tablet Oral QHS  . sodium chloride flush  3 mL Intravenous Q12H  . timolol  1 drop Left Eye Daily   Continuous Infusions: . sodium chloride    . cefTRIAXone (ROCEPHIN)  IV Stopped (02/05/17 2121)     LOS: 2 days    Time spent: 35 mins    Ramiro Harvestaniel Arline Ketter, MD [facility] Pager [phone]  If 7PM-7AM, please contact night-coverage [email or web address] Password [email or web address] 02/06/2017, 11:36 AM

## 2017-02-06 NOTE — Progress Notes (Signed)
Initial Nutrition Assessment  DOCUMENTATION CODES:   Non-severe (moderate) malnutrition in context of chronic illness  INTERVENTION:   Provide Ensure Enlive po BID, each supplement provides 350 kcal and 20 grams of protein RD will continue to monitor  NUTRITION DIAGNOSIS:   Moderate Malnutrition related to constipation, diarrhea(chronic) as evidenced by percent weight loss, mild muscle depletion.  GOAL:   Patient will meet greater than or equal to 90% of their needs  MONITOR:   PO intake, Supplement acceptance, Labs, Weight trends, I & O's  REASON FOR ASSESSMENT:   Malnutrition Screening Tool    ASSESSMENT:   83yo female with PMH DM, HTN, Dementia, CKD-IV, CAD/LBBB, chronic diastolic CHF, h/o DVT, PAD, who [facility] with acute on chronic CHF exacerbation. She presented to the ED complaining of severe rectal pain after trying to have a bowel movement. The patient has a long-standing history of rectal pain and constipation.   Patient previously seen by nutrition team in September. Pt has continued to experience constipation and diarrhea which has affected her appetite. Pt with possible rectal prolapse given history. Per surgery, pt is not showing signs of a prolapse today. Pt would benefit from nutritional supplements. Pt's UBW is 130 lb. Pt has lost 15 lb since 9/23 (12% wt loss x 2 months, significant for time frame).  Medications: Senokot-S tablet daily Labs reviewed: CBGs: 76-110  GFR: 20   NUTRITION - FOCUSED PHYSICAL EXAM:    Most Recent Value  Orbital Region  No depletion  Upper Arm Region  No depletion  Thoracic and Lumbar Region  Unable to assess  Buccal Region  No depletion  Temple Region  Mild depletion  Clavicle Bone Region  Mild depletion  Clavicle and Acromion Bone Region  No depletion  Scapular Bone Region  Unable to assess  Dorsal Hand  Mild depletion  Patellar Region  Unable to assess  Anterior Thigh Region  Unable to assess   Posterior Calf Region  Unable to assess  Edema (RD Assessment)  Unable to assess       Diet Order:  Diet Carb Modified Fluid consistency: Thin; Room service appropriate? Yes  EDUCATION NEEDS:   No education needs have been identified at this time  Skin:  Skin Assessment: Reviewed RN Assessment  Last BM:  11/18  Height:   Ht Readings from Last 1 Encounters:  02/05/17 5' (1.524 m)    Weight:   Wt Readings from Last 1 Encounters:  02/06/17 115 lb 15.4 oz (52.6 kg)    Ideal Body Weight:  45.5 kg  BMI:  Body mass index is 22.65 kg/m.  Estimated Nutritional Needs:   Kcal:  1300-1500  Protein:  60-70g  Fluid:  1.5L/day  Tilda FrancoLindsey Magally Vahle, MS, RD, LDN [facility] Clinical Dietitian Pager: [phone] After Hours Pager: [phone]

## 2017-02-06 NOTE — Progress Notes (Signed)
Pt had 4 beat run of VT, currently in NSR 70s. Asymptomatic. On-call MD paged to be made aware.. Will continue to monitor.

## 2017-02-07 ENCOUNTER — Ambulatory Visit: Payer: Medicare Other | Admitting: Family

## 2017-02-07 ENCOUNTER — Encounter (HOSPITAL_COMMUNITY): Payer: Medicare Other

## 2017-02-07 DIAGNOSIS — N189 Chronic kidney disease, unspecified: Secondary | ICD-10-CM

## 2017-02-07 DIAGNOSIS — N179 Acute kidney failure, unspecified: Secondary | ICD-10-CM | POA: Diagnosis not present

## 2017-02-07 LAB — BASIC METABOLIC PANEL
Anion gap: 9 (ref 5–15)
BUN: 32 mg/dL — AB (ref 6–20)
CHLORIDE: 104 mmol/L (ref 101–111)
CO2: 25 mmol/L (ref 22–32)
CREATININE: 2.66 mg/dL — AB (ref 0.44–1.00)
Calcium: 8.1 mg/dL — ABNORMAL LOW (ref 8.9–10.3)
GFR calc non Af Amer: 15 mL/min — ABNORMAL LOW (ref >60)
GFR, EST AFRICAN AMERICAN: 17 mL/min — AB (ref >60)
Glucose, Bld: 113 mg/dL — ABNORMAL HIGH (ref 65–99)
POTASSIUM: 3.6 mmol/L (ref 3.5–5.1)
SODIUM: 138 mmol/L (ref 135–145)

## 2017-02-07 LAB — GLUCOSE, CAPILLARY
GLUCOSE-CAPILLARY: 106 mg/dL — AB (ref 65–99)
GLUCOSE-CAPILLARY: 194 mg/dL — AB (ref 65–99)

## 2017-02-07 MED ORDER — FUROSEMIDE 20 MG PO TABS: 20.0000 mg | ORAL_TABLET | Freq: Two times a day (BID) | ORAL | Status: DC

## 2017-02-07 NOTE — Progress Notes (Signed)
Patient has voided multiple times today but unable to measure voided urine due to incontinence and patient removing hat from toilet when voiding.  Continually reinforce and reeducate that we need to measure patient's urine to patient and her significant other.  Will continue to monitor.

## 2017-02-07 NOTE — Progress Notes (Signed)
PROGRESS NOTE    [name]  ZOX:[id number] DOB: 01-27-1928 DOA: 02/04/2017 PCP: Kirt Boysarter, Monica, DO    Brief Narrative:  Patient is a 83 year old female with history of type 2 diabetes, hypertension, chronic kidney disease stage IV, chronic diastolic heart failure, history of DVT, peripheral artery disease who had presented with some rectal plane after having a bowel movement with difficulty urinating.  Patient also noted to be hypoxic and admitted for probable acute on chronic CHF exacerbation.  Patient also noted to have a rectal prolapse.  2D echo was abnormal.  General surgery and cardiology consulted.   Assessment & Plan:   Principal Problem:   Acute on chronic diastolic CHF (congestive heart failure) (HCC) Active Problems:   Coronary artery disease   Hyperlipidemia   Hypertension   Diabetes mellitus with nephropathy (HCC)   PAD (peripheral artery disease) (HCC)   Rectal prolapse   Hemorrhoids   Hypoxia   Abnormal CXR   Accelerated hypertension   LBBB (left bundle branch block)   Urinary tract infection without hematuria  #1 acute on chronic diastolic heart failure Patient presented with hypoxic concern for acute on chronic diastolic heart failure.  Chest x-ray and CT chest worrisome for volume overload.  Cardiac enzymes minimally elevated.  2D echo with a EF of 45-50% with wall motion abnormalities.  She received doses of IV Lasix.  Due to wall motion abnormalities noted on 2D echo and CHF exacerbation cardiology was consulted.  Cardiology recommended no further diuresis as they feel patient is not volume overloaded clinically at this time and due to increasing creatinine.  Cardiology no further workup needed at this time.  Strict I's and O's.  Daily weights.   2.  Diarrhea Patient noted to have multiple loose stools per nursing.  Secondary to rectal prolapse.   3.  HTN Continue clonidine, bystolic.  Lasix on hold secondary to worsening renal function.  4.   Hypertension Continue statin.  5.  Diabetes mellitus with nephropathy Hemoglobin A1c 6.2.  CBGs ranging from 106-137.  Continue sliding scale insulin.  6.  Coronary artery disease/left bundle branch block Cardiac enzymes minimally elevated.  2D echo with EF of 45-50%, hypokinesis of basal mid inferior and inferior septal myocardium consistent with ischemia in the distribution of the RCA.  Grade 2 diastolic dysfunction.  Continue aspirin, beta-blocker, statin, aspirin.  Due to abnormal 2D echo and volume overload cardiology has been consulted.  Patient has been seen in consultation by cardiology who feel patient is not volume overloaded clinically and recommend no further diuresis.  Cardiology recommending Lasix be resumed in the next 1-2 days once renal function improves.  Follow.  #7  Acute on chronic kidney disease stage III/IV Baseline approximately creatinine of 1.84.  Renal function trending up creatinine currently at 2.66.  Lasix on hold likely resume in the next 48 hours once renal function has improved.    #8 UTI Urine cultures pending.  Patient has received 3 days of IV Rocephin.  We will discontinue antibiotics.    #9 rectal prolapse hydrortisone cream.  Sitz baths.  Patient has been seen in consultation by general surgery consult with general surgery who feel patient is high risk for any urgent surgical intervention which is currently not likely indicated at this time.  General surgery recommended outpatient follow-up, PT for pelvic floor strengthening, ice, Tucks pads, bedrest and hydrocortisone cream as needed.      DVT prophylaxis: Lovenox Code Status: Full Family Communication: Updated patient and friend at  bedtime. Disposition Plan: To be determined.  Likely home versus SNIF pending PT evaluation and once renal function back to baseline.     Consultants:   Cardiology: Dr. Jens Somrenshaw 02/06/2017  General surgery Dr. Johna SheriffHoxworth 02/06/2017  Procedures:   2D echo 02/05/2017  pending  CT chest without contrast 02/05/2017  Abdominal x-ray 02/04/2017  Chest x-ray 02/04/2017  Antimicrobials:   IV Rocephin 02/04/2017>>>>>> 02/07/2017   Subjective: Patient denies any chest pain.  No shortness of breath.  Some improvement with rectal pain.    Objective: Vitals:   02/06/17 1313 02/06/17 2121 02/07/17 0428 02/07/17 1040  BP: (!) 115/41 (!) 144/41 (!) 144/46 (!) 160/38  Pulse: (!) 58 61 (!) 58 60  Resp: 18 18 18    Temp: 97.7 F (36.5 C) 99.4 F (37.4 C) 100 F (37.8 C)   TempSrc: Oral Oral Oral   SpO2: 92% 94% 95%   Weight:   53.5 kg (117 lb 15.1 oz)   Height:        Intake/Output Summary (Last 24 hours) at 02/07/2017 1135 Last data filed at 02/06/2017 2027 Gross per 24 hour  Intake 50 ml  Output -  Net 50 ml   Filed Weights   02/05/17 0121 02/06/17 0642 02/07/17 0428  Weight: 54.9 kg (121 lb 0.5 oz) 52.6 kg (115 lb 15.4 oz) 53.5 kg (117 lb 15.1 oz)    Examination:  General exam: NAD Respiratory system: Bibasilar crackles. No wheezing. Respiratory effort normal. Cardiovascular system: Regular rate and rhythm no murmurs rubs or gallops.  No lower extremity edema.  Gastrointestinal system: Abdomen is soft, nontender, nondistended, positive bowel sounds.  No hepatosplenomegaly.  Central nervous system: Alert and oriented. No focal neurological deficits. Extremities: Symmetric 5 x 5 power. Skin: No rashes, lesions or ulcers Psychiatry: Judgement and insight appear normal. Mood & affect appropriate.     Data Reviewed: I have personally reviewed following labs and imaging studies  CBC: Recent Labs  Lab 02/01/17 2146 02/04/17 1950 02/05/17 0749  WBC 6.0 7.9 7.6  NEUTROABS  --   --  5.8  HGB 10.8* 10.7* 10.8*  HCT 34.2* 33.2* 33.6*  MCV 93.4 93.0 93.1  PLT 200 208 166   Basic Metabolic Panel: Recent Labs  Lab 02/01/17 2146 02/04/17 1950 02/05/17 0749 02/06/17 0528 02/07/17 0526  NA 141 143 142 139 138  K 3.9 4.7 3.5 3.7 3.6   CL 109 111 109 107 104  CO2 26 27 22 23 25   GLUCOSE 98 104* 103* 84 113*  BUN 28* 32* 28* 29* 32*  CREATININE 1.84* 2.05* 1.94* 2.10* 2.66*  CALCIUM 8.6* 8.7* 8.4* 8.2* 8.1*  MG  --   --  1.9  --   --    GFR: Estimated Creatinine Clearance: 10.7 mL/min (A) (by C-G formula based on SCr of 2.66 mg/dL (H)). Liver Function Tests: Recent Labs  Lab 02/01/17 2146 02/04/17 1950  AST 15 13*  ALT 9* 7*  ALKPHOS 59 59  BILITOT 0.5 0.5  PROT 6.1* 6.3*  ALBUMIN 3.3* 3.6   No results for input(s): LIPASE, AMYLASE in the last 168 hours. No results for input(s): AMMONIA in the last 168 hours. Coagulation Profile: No results for input(s): INR, PROTIME in the last 168 hours. Cardiac Enzymes: Recent Labs  Lab 02/05/17 0155 02/05/17 0749 02/05/17 1344  TROPONINI 0.03* 0.03* 0.04*   BNP (last 3 results) No results for input(s): PROBNP in the last 8760 hours. HbA1C: Recent Labs    02/05/17 0155  HGBA1C  6.2*   CBG: Recent Labs  Lab 02/05/17 2159 02/06/17 0755 02/06/17 1725 02/06/17 2117 02/07/17 0727  GLUCAP 110* 76 118* 137* 106*   Lipid Profile: No results for input(s): CHOL, HDL, LDLCALC, TRIG, CHOLHDL, LDLDIRECT in the last 72 hours. Thyroid Function Tests: Recent Labs    02/05/17 0155  TSH 2.485   Anemia Panel: No results for input(s): VITAMINB12, FOLATE, FERRITIN, TIBC, IRON, RETICCTPCT in the last 72 hours. Sepsis Labs: No results for input(s): PROCALCITON, LATICACIDVEN in the last 168 hours.  No results found for this or any previous visit (from the past 240 hour(s)).       Radiology Studies: No results found.      Scheduled Meds: . aspirin EC  81 mg Oral Daily  . cloNIDine  0.1 mg Oral Daily  . enoxaparin (LOVENOX) injection  30 mg Subcutaneous Q24H  . ezetimibe-simvastatin  0.5 tablet Oral q1800  . feeding supplement (ENSURE ENLIVE)  237 mL Oral BID BM  . hydrocortisone   Rectal TID  . insulin aspart  0-5 Units Subcutaneous QHS  . insulin  aspart  0-9 Units Subcutaneous TID WC  . nebivolol  20 mg Oral Daily  . senna-docusate  1 tablet Oral QHS  . sodium chloride flush  3 mL Intravenous Q12H  . timolol  1 drop Left Eye Daily   Continuous Infusions: . sodium chloride    . cefTRIAXone (ROCEPHIN)  IV Stopped (02/06/17 2057)     LOS: 3 days    Time spent: 35 mins    Ramiro Harvest, MD [facility] Pager [phone]  If 7PM-7AM, please contact night-coverage [email or web address] Password [email or web address] 02/07/2017, 11:35 AM

## 2017-02-07 NOTE — Evaluation (Signed)
Physical Therapy Evaluation Patient Details Name: [name] MRN: [id number] DOB: [birth year] Today's Date: 02/07/2017   History of Present Illness  [name] is an 83yo female with PMH DM, HTN, Dementia, CKD-IV, CAD/LBBB, chronic diastolic CHF, h/o DVT, PAD, who was [facility] 11/17 with acute on chronic CHF exacerbation.  Clinical Impression  Patient evaluated by Physical Therapy with no further acute PT needs identified. All education has been completed and the patient has no further questions.  See below for any follow-up Physical Therapy or equipment needs. PT is signing off. Thank you for this referral. Pt doing well, pt and family feel she is at her baseline; encouraged pt to continue amb to bathroom and walk with nursing if she wishes; no  Further PT needs     Follow Up Recommendations No PT follow up    Equipment Recommendations  None recommended by PT    Recommendations for Other Services       Precautions / Restrictions Precautions Precautions: Fall Restrictions Weight Bearing Restrictions: No      Mobility  Bed Mobility Overal bed mobility: Modified Independent                Transfers Overall transfer level: Needs assistance   Transfers: Sit to/from Stand Sit to Stand: Supervision;Min guard         General transfer comment: for safety  Ambulation/Gait Ambulation/Gait assistance: Min guard;Supervision Ambulation Distance (Feet): 100 Feet Assistive device: None Gait Pattern/deviations: Wide base of support;Decreased stride length;Trunk flexed     General Gait Details: min/guard for safety and to steady initially in standing  Stairs            Wheelchair Mobility    Modified Rankin (Stroke Patients Only)       Balance Overall balance assessment: Needs assistance(denies falls)   Sitting balance-Leahy Scale: Good       Standing balance-Leahy Scale: Fair Standing balance comment: fair to good, tolerates min  perturbations with steppage response to recover             High level balance activites: Turns;Head turns High Level Balance Comments: no LOB, mildly unsteady at times but no overt LOB             Pertinent Vitals/Pain Pain Assessment: No/denies pain    Home Living Family/patient expects to be discharged to:: Private residence Living Arrangements: Spouse/significant other;Other relatives Available Help at Discharge: Available 24 hours/day;Family Type of Home: House Home Access: Stairs to enter Entrance Stairs-Rails: Can reach both Entrance Stairs-Number of Steps: 3-4 Home Layout: One level Home Equipment: Walker - 2 wheels;Bedside commode;Wheelchair - manual Additional Comments: pt is a retired Charity fundraiserN; 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 her at home and assist as needed    Prior Function Level of Independence: Independent               Hand Dominance        Extremity/Trunk Assessment   Upper Extremity Assessment Upper Extremity Assessment: Overall WFL for tasks assessed    Lower Extremity Assessment Lower Extremity Assessment: Generalized weakness    Cervical / Trunk Assessment Cervical / Trunk Assessment: Kyphotic  Communication   Communication: HOH  Cognition Arousal/Alertness: Awake/alert Behavior During Therapy: WFL for tasks assessed/performed Overall Cognitive Status: Within Functional Limits for tasks assessed                                 General  Comments: hx of dementia although appears WFL during PT eval      General Comments      Exercises     Assessment/Plan    PT Assessment Patent does not need any further PT services  PT Problem List         PT Treatment Interventions      PT Goals (Current goals can be found in the Care Plan section)  Acute Rehab PT Goals Patient Stated Goal: home asap PT Goal Formulation: All assessment and education complete, DC therapy    Frequency     Barriers to discharge         Co-evaluation               AM-PAC PT "6 Clicks" Daily Activity  Outcome Measure Difficulty turning over in bed (including adjusting bedclothes, sheets and blankets)?: None Difficulty moving from lying on back to sitting on the side of the bed? : None Difficulty sitting down on and standing up from a chair with arms (e.g., wheelchair, bedside commode, etc,.)?: A Little Help needed moving to and from a bed to chair (including a wheelchair)?: A Little Help needed walking in hospital room?: A Little Help needed climbing 3-5 steps with a railing? : A Little 6 Click Score: 20    End of Session   Activity Tolerance: Patient tolerated treatment well Patient left: in chair;with call bell/phone within reach;with family/visitor present(no alarm box in room) Nurse Communication: Mobility status PT Visit Diagnosis: Unsteadiness on feet (R26.81)    Time: 1610-96041145-1201 PT Time Calculation (min) (ACUTE ONLY): 16 min   Charges:   PT Evaluation $PT Eval Low Complexity: 1 Low     PT G CodesDrucilla Chalet:        Nikeya Maxim, PT Pager: [phone] 02/07/2017   Drucilla ChaletWILLIAMS,Aislin Onofre 02/07/2017, 12:21 PM

## 2017-02-07 NOTE — Progress Notes (Signed)
Progress Note  Patient Name: [name] Date of Encounter: 02/07/2017  Primary Cardiologist: Dr Excell Seltzerooper, L. Tyrone SageGerhardt, NP   Subjective   No chest pain or SOB. Says her rectum has moved over in her abdomen and she wants surgery for this. She says she is not like herself and would rather die like herself than live another way.  Inpatient Medications    Scheduled Meds: . aspirin EC  81 mg Oral Daily  . cloNIDine  0.1 mg Oral Daily  . enoxaparin (LOVENOX) injection  30 mg Subcutaneous Q24H  . ezetimibe-simvastatin  0.5 tablet Oral q1800  . feeding supplement (ENSURE ENLIVE)  237 mL Oral BID BM  . [START ON 02/08/2017] furosemide  20 mg Oral BID  . hydrocortisone   Rectal TID  . insulin aspart  0-5 Units Subcutaneous QHS  . insulin aspart  0-9 Units Subcutaneous TID WC  . nebivolol  20 mg Oral Daily  . senna-docusate  1 tablet Oral QHS  . sodium chloride flush  3 mL Intravenous Q12H  . timolol  1 drop Left Eye Daily   Continuous Infusions: . sodium chloride    . cefTRIAXone (ROCEPHIN)  IV Stopped (02/06/17 2057)   PRN Meds: sodium chloride, acetaminophen, morphine injection, ondansetron (ZOFRAN) IV, sodium chloride flush, traMADol   Vital Signs    Vitals:   02/06/17 0642 02/06/17 1313 02/06/17 2121 02/07/17 0428  BP: (!) 177/37 (!) 115/41 (!) 144/41 (!) 144/46  Pulse: 61 (!) 58 61 (!) 58  Resp:  18 18 18   Temp: 98.3 F (36.8 C) 97.7 F (36.5 C) 99.4 F (37.4 C) 100 F (37.8 C)  TempSrc: Oral Oral Oral Oral  SpO2: 98% 92% 94% 95%  Weight: 115 lb 15.4 oz (52.6 kg)   117 lb 15.1 oz (53.5 kg)  Height:        Intake/Output Summary (Last 24 hours) at 02/07/2017 [id number] Last data filed at 02/06/2017 2027 Gross per 24 hour  Intake 50 ml  Output -  Net 50 ml   Filed Weights   02/05/17 0121 02/06/17 0642 02/07/17 0428  Weight: 121 lb 0.5 oz (54.9 kg) 115 lb 15.4 oz (52.6 kg) 117 lb 15.1 oz (53.5 kg)    Telemetry    SR, 3 bt run NSVT seen, but not many PVCs -  Personally Reviewed  ECG    None today - Personally Reviewed  Physical Exam   GEN: No acute distress.   Neck: No JVD Cardiac: RRR, no murmurs, rubs, or gallops.  Respiratory: Clear to auscultation bilaterally. GI: Soft, nontender, non-distended  MS: No edema; No deformity. Neuro:  Nonfocal  Psych: Normal affect   Labs    Chemistry Recent Labs  Lab 02/01/17 2146 02/04/17 1950 02/05/17 0749 02/06/17 0528 02/07/17 0526  NA 141 143 142 139 138  K 3.9 4.7 3.5 3.7 3.6  CL 109 111 109 107 104  CO2 26 27 22 23 25   GLUCOSE 98 104* 103* 84 113*  BUN 28* 32* 28* 29* 32*  CREATININE 1.84* 2.05* 1.94* 2.10* 2.66*  CALCIUM 8.6* 8.7* 8.4* 8.2* 8.1*  PROT 6.1* 6.3*  --   --   --   ALBUMIN 3.3* 3.6  --   --   --   AST 15 13*  --   --   --   ALT 9* 7*  --   --   --   ALKPHOS 59 59  --   --   --   BILITOT  0.5 0.5  --   --   --   GFRNONAA 24* 21* 22* 20* 15*  GFRAA 27* 24* 26* 23* 17*  ANIONGAP 6 5 11 9 9      Hematology Recent Labs  Lab 02/01/17 2146 02/04/17 1950 02/05/17 0749  WBC 6.0 7.9 7.6  RBC 3.66* 3.57* 3.61*  HGB 10.8* 10.7* 10.8*  HCT 34.2* 33.2* 33.6*  MCV 93.4 93.0 93.1  MCH 29.5 30.0 29.9  MCHC 31.6 32.2 32.1  RDW 14.0 13.9 14.1  PLT 200 208 166    Cardiac Enzymes Recent Labs  Lab 02/05/17 0155 02/05/17 0749 02/05/17 1344  TROPONINI 0.03* 0.03* 0.04*      BNP Recent Labs  Lab 02/04/17 1950  BNP 1,024.7*     Radiology    No results found.  Cardiac Studies   ECHO: 02/05/2017 - Left ventricle: The cavity size was normal. Wall thickness was   normal. Systolic function was mildly reduced. The estimated   ejection fraction was in the range of 45% to 50%. Hypokinesis of   the basal-midinferior and inferoseptal myocardium; consistent   with ischemia in the distribution of the right coronary artery.   Features are consistent with a pseudonormal left ventricular   filling pattern, with concomitant abnormal relaxation and   increased filling  pressure (grade 2 diastolic dysfunction). - Mitral valve: Calcified annulus. There was mild regurgitation. - Left atrium: The atrium was severely dilated. - Pulmonary arteries: PA peak pressure: 31 mm Hg (S).  Patient Profile     83 y.o. female Ashley RheinDoris E Chism is a 83 y.o. female with a hx of BMS RCA and DES Diag 03/2010. DM, HTN, HLD, D-CHF w/ EF 55-60% per cath 2012 (35% in 2000) , PVD (Dr. Fields)w/bilateral CIA stenting 12/2013, gout, CKD III, chronic anemia, LBBB.   She was admitted 11/17 w/ rectal pain and constipation. She also had volume overload. CCS has seen, no surgery planned right now. Cards saw for CHF   Assessment & Plan    Principal Problem: 1.  Acute on chronic diastolic CHF (congestive heart failure) (HCC) - wt down at least 4 lbs since admit - resume home dose Lasix of 20 mg bid on 11/21, MD advise on delaying that another day - volume status is good right now - needs daily weights, 2000 mg Na diet after d/c - has f/u appt scheduled.  Active Problems: 2.  Coronary artery disease - on ASA - on home doses of Bystolic and Vytorin - Cozaar 50 mg qd is on hold, with worsening renal function, continue to hold.   Otherwise, per IM   Hyperlipidemia   Hypertension   Diabetes mellitus with nephropathy (HCC)   PAD (peripheral artery disease) (HCC)   Rectal prolapse   Hemorrhoids   Hypoxia   Abnormal CXR   Accelerated hypertension   LBBB (left bundle branch block)   Urinary tract infection without hematuria   For questions or updates, please contact [facility] Please consult [email or web address] for contact info under Cardiology/STEMI.      Signed, Theodore Demarkhonda Barrett, PA-C  02/07/2017, 9:23 AM   As above, patient seen and examined. Patient denies dyspnea or chest pain but continues to complain of rectal pain. She is not volume overloaded on examination. Would not diurese further. Her creatinine has increased. Would resume home dose of Lasix has renal function  improves (likely 24-48 hrs) and resume ARB. Other issues per primary care. Please call with questions.  Olga MillersBrian Kalliope Riesen, MD

## 2017-02-08 LAB — CREATININE, SERUM
Creatinine, Ser: 2.31 mg/dL — ABNORMAL HIGH (ref 0.44–1.00)
GFR calc Af Amer: 21 mL/min — ABNORMAL LOW (ref >60)
GFR, EST NON AFRICAN AMERICAN: 18 mL/min — AB (ref >60)

## 2017-02-08 LAB — BASIC METABOLIC PANEL
Anion gap: 9 (ref 5–15)
BUN: 38 mg/dL — AB (ref 6–20)
CHLORIDE: 103 mmol/L (ref 101–111)
CO2: 24 mmol/L (ref 22–32)
CREATININE: 2.36 mg/dL — AB (ref 0.44–1.00)
Calcium: 8 mg/dL — ABNORMAL LOW (ref 8.9–10.3)
GFR calc non Af Amer: 17 mL/min — ABNORMAL LOW (ref >60)
GFR, EST AFRICAN AMERICAN: 20 mL/min — AB (ref >60)
Glucose, Bld: 225 mg/dL — ABNORMAL HIGH (ref 65–99)
POTASSIUM: 3.8 mmol/L (ref 3.5–5.1)
Sodium: 136 mmol/L (ref 135–145)

## 2017-02-08 LAB — CBC
HEMATOCRIT: 29.4 % — AB (ref 36.0–46.0)
Hemoglobin: 9.5 g/dL — ABNORMAL LOW (ref 12.0–15.0)
MCH: 29.8 pg (ref 26.0–34.0)
MCHC: 32.3 g/dL (ref 30.0–36.0)
MCV: 92.2 fL (ref 78.0–100.0)
PLATELETS: 142 10*3/uL — AB (ref 150–400)
RBC: 3.19 MIL/uL — AB (ref 3.87–5.11)
RDW: 13.7 % (ref 11.5–15.5)
WBC: 4.1 10*3/uL (ref 4.0–10.5)

## 2017-02-08 LAB — GLUCOSE, CAPILLARY
GLUCOSE-CAPILLARY: 140 mg/dL — AB (ref 65–99)
GLUCOSE-CAPILLARY: 181 mg/dL — AB (ref 65–99)
GLUCOSE-CAPILLARY: 197 mg/dL — AB (ref 65–99)
GLUCOSE-CAPILLARY: 192 mg/dL — AB (ref 65–99)
Glucose-Capillary: 119 mg/dL — ABNORMAL HIGH (ref 65–99)
Glucose-Capillary: 178 mg/dL — ABNORMAL HIGH (ref 65–99)
Glucose-Capillary: 141 mg/dL — ABNORMAL HIGH (ref 65–99)
Glucose-Capillary: 143 mg/dL — ABNORMAL HIGH (ref 65–99)

## 2017-02-08 MED ORDER — CLONIDINE HCL 0.1 MG PO TABS
0.1000 mg | ORAL_TABLET | Freq: Two times a day (BID) | ORAL | Status: DC
  Administered 2017-02-08 – 2017-02-09 (×3): 0.1 mg via ORAL
  Filled 2017-02-08 (×3): qty 1

## 2017-02-08 MED ORDER — HYDRALAZINE HCL 25 MG PO TABS
25.0000 mg | ORAL_TABLET | Freq: Three times a day (TID) | ORAL | Status: DC
  Administered 2017-02-08 – 2017-02-09 (×3): 25 mg via ORAL
  Filled 2017-02-08 (×3): qty 1

## 2017-02-08 NOTE — Evaluation (Addendum)
Occupational Therapy Evaluation and Discharge Patient Details Name: [name] MRN: [id number] DOB: [birth year] Today's Date: 02/08/2017    History of Present Illness [name] is an 83yo female with PMH DM, HTN, Dementia, CKD-IV, CAD/LBBB, chronic diastolic CHF, h/o DVT, PAD, who was [facility]17 with acute on chronic CHF exacerbation.   Clinical Impression    Pt is functioning in ADL and ADL transfers at a supervision level. She has supervision at home of her daughter and significant other. No further OT needs.    Follow Up Recommendations  No OT follow up    Equipment Recommendations  None recommended by OT    Recommendations for Other Services       Precautions / Restrictions Precautions Precautions: Fall Restrictions Weight Bearing Restrictions: No      Mobility Bed Mobility Overal bed mobility: Modified Independent                Transfers Overall transfer level: Needs assistance Equipment used: None Transfers: Sit to/from Stand Sit to Stand: Supervision         General transfer comment: for safety    Balance     Sitting balance-Leahy Scale: Good       Standing balance-Leahy Scale: Fair Standing balance comment: pt in a hurry urinate, provided supervision for ambulation                           ADL either performed or assessed with clinical judgement   ADL Overall ADL's : At baseline                                       General ADL Comments: Pt demonstrated ability to perform dressing, toileting and standing grooming with supervision for safety.     Vision Baseline Vision/History: Wears glasses Wears Glasses: At all times Patient Visual Report: No change from baseline       Perception     Praxis      Pertinent Vitals/Pain Pain Assessment: Faces Faces Pain Scale: Hurts a little bit Pain Location: bottom Pain Descriptors / Indicators: Sore Pain Intervention(s): Monitored during  session     Hand Dominance Right   Extremity/Trunk Assessment Upper Extremity Assessment Upper Extremity Assessment: Overall WFL for tasks assessed(arthritic changes in hands)   Lower Extremity Assessment Lower Extremity Assessment: Defer to PT evaluation   Cervical / Trunk Assessment Cervical / Trunk Assessment: Kyphotic   Communication Communication Communication: HOH   Cognition Arousal/Alertness: Awake/alert Behavior During Therapy: WFL for tasks assessed/performed Overall Cognitive Status: Within Functional Limits for tasks assessed                                 General Comments: hx of dementia, WFL for tasks assessed   General Comments       Exercises     Shoulder Instructions      Home Living Family/patient expects to be discharged to:: Private residence Living Arrangements: Spouse/significant other;Other relatives Available Help at Discharge: Available 24 hours/day;Family Type of Home: House Home Access: Stairs to enter Entrance Stairs-Number of Steps: 3-4 Entrance Stairs-Rails: Can reach both Home Layout: One level     Bathroom Shower/Tub: Tub/shower unit;Walk-in shower   Bathroom Toilet: Standard     Home Equipment: Environmental consultantWalker - 2 wheels;Bedside commode;Wheelchair - Manufacturing systems engineermanual;Shower seat  Additional Comments: pt is a retired Charity fundraiserN; 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 and assist as needed      Prior Functioning/Environment Level of Independence: Independent        Comments: pt cooks, won't eat her daughter's food        OT Problem List:        OT Treatment/Interventions:      OT Goals(Current goals can be found in the care plan section) Acute Rehab OT Goals Patient Stated Goal: home asap  OT Frequency:     Barriers to D/C:            Co-evaluation              AM-PAC PT "6 Clicks" Daily Activity     Outcome Measure Help from another person eating meals?: None Help from another person taking care of personal  grooming?: A Little Help from another person toileting, which includes using toliet, bedpan, or urinal?: A Little Help from another person bathing (including washing, rinsing, drying)?: A Little Help from another person to put on and taking off regular upper body clothing?: None Help from another person to put on and taking off regular lower body clothing?: A Little 6 Click Score: 20   End of Session Nurse Communication: Other (comment)(ok to give ginger ale)  Activity Tolerance: Patient tolerated treatment well Patient left: in chair;with call bell/phone within reach;with family/visitor present  OT Visit Diagnosis: Pain                Time: 8295-62131114-1145 OT Time Calculation (min): 31 min Charges:  OT General Charges $OT Visit: 1 Visit OT Evaluation $OT Eval Low Complexity: 1 Low OT Treatments $Self Care/Home Management : 8-22 mins G-Codes:     02/08/2017 [name], OTR/L Pager: [phone]  [name], [name] 02/08/2017, 11:52 AM

## 2017-02-08 NOTE — Progress Notes (Signed)
PROGRESS NOTE    Ashley RheinDoris E Oquendo  [id number] DOB: Dec 31, 1927 DOA: 02/04/2017 PCP: Kirt Boysarter, Monica, DO    Brief Narrative: Patient is a 83 year old female with history of type 2 diabetes, hypertension, chronic kidney disease stage IV, chronic diastolic heart failure, history of DVT, peripheral artery disease who had presented with some rectal plane after having a bowel movement with difficulty urinating.  Patient also noted to be hypoxic and admitted for probable acute on chronic CHF exacerbation.  Patient also noted to have a rectal prolapse.  2D echo was abnormal.  General surgery and cardiology consulted.    Assessment & Plan:   Principal Problem:   Acute on chronic diastolic CHF (congestive heart failure) (HCC) Active Problems:   Coronary artery disease   Hyperlipidemia   Hypertension   Diabetes mellitus with nephropathy (HCC)   PAD (peripheral artery disease) (HCC)   Rectal prolapse   Hemorrhoids   Hypoxia   Abnormal CXR   Accelerated hypertension   LBBB (left bundle branch block)   Urinary tract infection without hematuria   Acute kidney injury superimposed on CKD (HCC)stage III   Mild acute on chronic diastolic heart failure; She underwent echocardiogram systolic function moderately reduced, left EF is 45% to 50%.  Hypokinesis of the basal mid inferior and inferoseptal myocardium, consistent with ischemia in the distribution of RCA, grade 2 diastolic dysfunction.  Third recommended stopping the Lasix in view of rising creatinine.  Continue to hold ACE inhibitors. Outpatient follow-up with cardiology.  Currently she appears to be euvolemic.  She denies any shortness of breath or chest pain.    Acute kidney injuries superimposed on stage III CKD Secondary to over diuresis.  Holding Lasix for now.  Watching for creatinine to get back to her baseline.   Accelerated hypertension Increase clonidine dose to twice daily, added  hydralazine.  Hyperlipidemia Simvastatin   Type 2 diabetes with nephropathy Hemoglobin A1c 6.2.  Resume sliding scale insulin.   Coronary artery disease/left bundle branch block Cardiology consulted recommendations given.   Abnormal UA Urine cultures not sent, patient received 3 days of IV Rocephin.  Patient is currently asymptomatic.  Rocephin discontinued.   Rectal prolapse Surgery consulted recommended patient is high risk for any kind of surgical intervention.   outpatient follow-up with surgery. Continue with hydrocortisone cream and sitz bath.  Physical therapy for pelvic floor exercises.     DVT prophylaxis: Lovenox Code Status: Full code Family Communication: Family at bedside Disposition Plan: Discharge home in the morning when creatinine is back to baseline   Consultants:   Cardiology   Procedures: None   Antimicrobials: None   Subjective: No new complaints Objective: Vitals:   02/08/17 0627 02/08/17 0705 02/08/17 1247 02/08/17 1542  BP:  (!) 194/47 (!) 135/47 (!) 174/48  Pulse:  61 (!) 55 (!) 51  Resp:  18  18  Temp:  97.9 F (36.6 C) 97.8 F (36.6 C) 98.2 F (36.8 C)  TempSrc:  Oral  Oral  SpO2:  95% 94% 99%  Weight: 53.5 kg (117 lb 15.1 oz)     Height:        Intake/Output Summary (Last 24 hours) at 02/08/2017 1604 Last data filed at 02/08/2017 1300 Gross per 24 hour  Intake 358 ml  Output -  Net 358 ml   Filed Weights   02/06/17 0642 02/07/17 0428 02/08/17 0627  Weight: 52.6 kg (115 lb 15.4 oz) 53.5 kg (117 lb 15.1 oz) 53.5 kg (117 lb 15.1 oz)  Examination:  General exam: Appears calm and comfortable  Respiratory system: Clear to auscultation. Respiratory effort normal. Cardiovascular system: S1 & S2 heard, RRR. No JVD, murmurs, rubs, gallops or clicks. No pedal edema. Gastrointestinal system: Abdomen is nondistended, soft and nontender. No organomegaly or masses felt. Normal bowel sounds heard. Central nervous system:  Alert and oriented. No focal neurological deficits. Extremities: Symmetric 5 x 5 power. Skin: No rashes, lesions or ulcers Psychiatry: Judgement and insight appear normal. Mood & affect appropriate.     Data Reviewed: I have personally reviewed following labs and imaging studies  CBC: Recent Labs  Lab 02/01/17 2146 02/04/17 1950 02/05/17 0749 02/08/17 0520  WBC 6.0 7.9 7.6 4.1  NEUTROABS  --   --  5.8  --   HGB 10.8* 10.7* 10.8* 9.5*  HCT 34.2* 33.2* 33.6* 29.4*  MCV 93.4 93.0 93.1 92.2  PLT 200 208 166 142*   Basic Metabolic Panel: Recent Labs  Lab 02/04/17 1950 02/05/17 0749 02/06/17 0528 02/07/17 0526 02/08/17 0520 02/08/17 1437  NA 143 142 139 138 136  --   K 4.7 3.5 3.7 3.6 3.8  --   CL 111 109 107 104 103  --   CO2 27 22 23 25 24   --   GLUCOSE 104* 103* 84 113* 225*  --   BUN 32* 28* 29* 32* 38*  --   CREATININE 2.05* 1.94* 2.10* 2.66* 2.36* 2.31*  CALCIUM 8.7* 8.4* 8.2* 8.1* 8.0*  --   MG  --  1.9  --   --   --   --    GFR: Estimated Creatinine Clearance: 12.3 mL/min (A) (by C-G formula based on SCr of 2.31 mg/dL (H)). Liver Function Tests: Recent Labs  Lab 02/01/17 2146 02/04/17 1950  AST 15 13*  ALT 9* 7*  ALKPHOS 59 59  BILITOT 0.5 0.5  PROT 6.1* 6.3*  ALBUMIN 3.3* 3.6   No results for input(s): LIPASE, AMYLASE in the last 168 hours. No results for input(s): AMMONIA in the last 168 hours. Coagulation Profile: No results for input(s): INR, PROTIME in the last 168 hours. Cardiac Enzymes: Recent Labs  Lab 02/05/17 0155 02/05/17 0749 02/05/17 1344  TROPONINI 0.03* 0.03* 0.04*   BNP (last 3 results) No results for input(s): PROBNP in the last 8760 hours. HbA1C: No results for input(s): HGBA1C in the last 72 hours. CBG: Recent Labs  Lab 02/07/17 0727 02/07/17 2138 02/08/17 0746 02/08/17 0918 02/08/17 1145  GLUCAP 106* 194* 178* 141* 197*   Lipid Profile: No results for input(s): CHOL, HDL, LDLCALC, TRIG, CHOLHDL, LDLDIRECT in the  last 72 hours. Thyroid Function Tests: No results for input(s): TSH, T4TOTAL, FREET4, T3FREE, THYROIDAB in the last 72 hours. Anemia Panel: No results for input(s): VITAMINB12, FOLATE, FERRITIN, TIBC, IRON, RETICCTPCT in the last 72 hours. Sepsis Labs: No results for input(s): PROCALCITON, LATICACIDVEN in the last 168 hours.  No results found for this or any previous visit (from the past 240 hour(s)).       Radiology Studies: No results found.      Scheduled Meds: . aspirin EC  81 mg Oral Daily  . cloNIDine  0.1 mg Oral BID  . enoxaparin (LOVENOX) injection  30 mg Subcutaneous Q24H  . ezetimibe-simvastatin  0.5 tablet Oral q1800  . feeding supplement (ENSURE ENLIVE)  237 mL Oral BID BM  . hydrALAZINE  25 mg Oral Q8H  . hydrocortisone   Rectal TID  . insulin aspart  0-5 Units Subcutaneous QHS  .  insulin aspart  0-9 Units Subcutaneous TID WC  . nebivolol  20 mg Oral Daily  . senna-docusate  1 tablet Oral QHS  . sodium chloride flush  3 mL Intravenous Q12H  . timolol  1 drop Left Eye Daily   Continuous Infusions: . sodium chloride       LOS: 4 days    Time spent: 35 minutes.     Kathlen ModyVijaya Casey Maxfield, MD [facility] Pager [phone]  If 7PM-7AM, please contact night-coverage [email or web address] Password [email or web address] 02/08/2017, 4:04 PM

## 2017-02-08 NOTE — Care Management Note (Signed)
Case Management Note  Patient Details  Name: [name] MRN: [id number] Date of Birth: Aug 02, 1927  Subjective/Objective: 83 y/o f admitted w/CHF. From home. PT-no f/u.                    Action/Plan:d/c plan home.   Expected Discharge Date:                  Expected Discharge Plan:  Home/Self Care  In-House Referral:     Discharge planning Services  CM Consult  Post Acute Care Choice:    Choice offered to:     DME Arranged:    DME Agency:     HH Arranged:    HH Agency:     Status of Service:  In process, will continue to follow  If discussed at Long Length of Stay Meetings, dates discussed:    Additional Comments:  Lanier ClamMahabir, Sierah Lacewell, RN 02/08/2017, 11:10 AM

## 2017-02-08 NOTE — Progress Notes (Signed)
Report received from A. Helsabeck,RN. No change in assessment. [name] 

## 2017-02-09 DIAGNOSIS — K649 Unspecified hemorrhoids: Secondary | ICD-10-CM

## 2017-02-09 LAB — GLUCOSE, CAPILLARY: Glucose-Capillary: 119 mg/dL — ABNORMAL HIGH (ref 65–99)

## 2017-02-09 LAB — BASIC METABOLIC PANEL
ANION GAP: 8 (ref 5–15)
BUN: 38 mg/dL — ABNORMAL HIGH (ref 6–20)
CHLORIDE: 105 mmol/L (ref 101–111)
CO2: 26 mmol/L (ref 22–32)
CREATININE: 1.97 mg/dL — AB (ref 0.44–1.00)
Calcium: 8.4 mg/dL — ABNORMAL LOW (ref 8.9–10.3)
GFR calc non Af Amer: 22 mL/min — ABNORMAL LOW (ref >60)
GFR, EST AFRICAN AMERICAN: 25 mL/min — AB (ref >60)
Glucose, Bld: 127 mg/dL — ABNORMAL HIGH (ref 65–99)
POTASSIUM: 4.3 mmol/L (ref 3.5–5.1)
Sodium: 139 mmol/L (ref 135–145)

## 2017-02-09 MED ORDER — CLONIDINE HCL 0.1 MG PO TABS: 0.1000 mg | ORAL_TABLET | Freq: Two times a day (BID) | ORAL | 11 refills | Status: DC

## 2017-02-09 MED ORDER — HYDROCORTISONE 2.5 % RE CREA: 1.0000 "application " | TOPICAL_CREAM | Freq: Three times a day (TID) | RECTAL | 0 refills | Status: DC

## 2017-02-09 NOTE — Progress Notes (Signed)
Patient had $353.00 locked up in security. RN returned the cash to Mrs. Barcenas. Counted money for verification with the patient,Montez Morita her female friend [place], and [place], [place] present at the bedside. Patient has dementia and is forgetful. Money handed over to the patient after verifying the count was correct at $353.00.  Arta BruceDeutsch, Rachard Isidro [employer] 02/09/2017 1:34 PM

## 2017-02-09 NOTE — Progress Notes (Signed)
Patient refuses to wear telemetry this shift. Telemetry is ordered but pt refuses to allow staff to place on. Tele was not on prior to this shift. On-call MD paged to be made aware.

## 2017-02-09 NOTE — Progress Notes (Signed)
Patient is stable for discharge. Discharge instructions and medications have been reviewed with the patient and her female friend and all questions answered. AVS and prescriptions given to patient.  Arta BruceDeutsch, Shirley Bolle [facility] 02/09/2017 12:38 PM

## 2017-02-10 ENCOUNTER — Ambulatory Visit: Payer: Self-pay | Admitting: *Deleted

## 2017-02-10 ENCOUNTER — Other Ambulatory Visit: Payer: Self-pay | Admitting: *Deleted

## 2017-02-12 NOTE — Discharge Summary (Signed)
Physician Discharge Summary  [name] ZOX:[id number] DOB: [birth year] DOA: 02/04/2017  PCP: Kirt Boysarter, Monica, DO  Admit date: 02/04/2017 Discharge date: 02/09/2017  Admitted From:Home.  Disposition:  Home.   Recommendations for Outpatient Follow-up:  1. Follow up with PCP in 1-2 weeks 2. Please obtain BMP/CBC in one week Please follow up with cardiology in 1 to 2 weeks.  Please follow up with Surgery as outpatient.   Discharge Condition:stable.  CODE STATUS: full code.  Diet recommendation: Heart Healthy   Brief/Interim Summary: Patient is a 83 year old female with history of type 2 diabetes, hypertension, chronic kidney disease stage IV, chronic diastolic heart failure, history of DVT, peripheral artery disease who had presented with some rectal plane after having a bowel movement with difficulty urinating. Patient also noted to be hypoxic and admitted for probable acute on chronic CHF exacerbation. Patient also noted to have a rectal prolapse. 2D echo was abnormal. General surgery and cardiology consulted.     Discharge Diagnoses:  Principal Problem:   Acute on chronic diastolic CHF (congestive heart failure) (HCC) Active Problems:   Coronary artery disease   Hyperlipidemia   Hypertension   Diabetes mellitus with nephropathy (HCC)   PAD (peripheral artery disease) (HCC)   Rectal prolapse   Hemorrhoids   Hypoxia   Abnormal CXR   Accelerated hypertension   LBBB (left bundle branch block)   Urinary tract infection without hematuria   Acute kidney injury superimposed on CKD (HCC)stage III   Mild acute on chronic diastolic heart failure; She underwent echocardiogram systolic function moderately reduced, left EF is 45% to 50%.  Hypokinesis of the basal mid inferior and inferoseptal myocardium, consistent with ischemia in the distribution of RCA, grade 2 diastolic dysfunction.  Cardiology recommended stopping the Lasix in view of rising creatinine.  Continue to hold ACE  inhibitors on discharge. Outpatient follow-up with cardiology.  Currently she appears to be euvolemic.  She denies any shortness of breath or chest pain.     Acute kidney injuries superimposed on stage III CKD Secondary to over diuresis.  Holding Lasix for now.  Watching for creatinine to get back to her baseline. And cardiology can restart her on lasix and ACE inhibitor.    Accelerated hypertension Increase clonidine dose to twice daily. bp better on discharge.   Hyperlipidemia Simvastatin  Type 2 diabetes with nephropathy Hemoglobin A1c 6.2.     Coronary artery disease/left bundle branch block Cardiology consulted recommendations given.   Abnormal UA Urine cultures not sent, patient received 3 days of IV Rocephin.  Patient is currently asymptomatic.  Rocephin discontinued.   Rectal prolapse Surgery consulted recommended patient is high risk for any kind of surgical intervention. outpatient follow-up with surgery. Continue with hydrocortisone cream and sitz bath.  Physical therapy for pelvic floor exercises.     Discharge Instructions  Discharge Instructions    Diet - low sodium heart healthy   Complete by:  As directed    Discharge instructions   Complete by:  As directed    Please follow up with cardiology in one week.     Allergies as of 02/09/2017      Reactions   Namzaric [memantine Hcl-donepezil Hcl] Nausea Only   confusion   Percocet [oxycodone-acetaminophen] Other (See Comments)   loosy goosy   Sulfa Antibiotics Other (See Comments)   Tongue swells   Adhesive [tape] Other (See Comments)   Tears skin.  Please use "paper" tape   Metformin And Related Diarrhea   Abnormal Kidney Functions  Tradjenta [linagliptin]    Low back ache, resolved once medication stopped    Penicillins Hives   Tolerates Ceftriaxone, Has patient had a PCN reaction causing immediate rash, facial/tongue/throat swelling, SOB or lightheadedness with hypotension:  Unknown Has patient had a PCN reaction causing severe rash involving mucus membranes or skin necrosis: No Has patient had a PCN reaction that required hospitalization No Has patient had a PCN reaction occurring within the last 10 years: No If all of the above answers are "NO", then may proceed with Cephalosporin use.      Medication List    STOP taking these medications   losartan 50 MG tablet Commonly known as:  COZAAR   meloxicam 15 MG tablet Commonly known as:  MOBIC   SPS 15 GM/60ML suspension Generic drug:  sodium polystyrene     TAKE these medications   aspirin EC 81 MG tablet Take 1 tablet (81 mg total) by mouth daily.   BYSTOLIC 20 MG Tabs Generic drug:  Nebivolol HCl TAKE 1 TABLET (20 MG TOTAL) BY MOUTH DAILY. FOR BLOOD PRESSURE   cloNIDine 0.1 MG tablet Commonly known as:  CATAPRES Take 1 tablet (0.1 mg total) by mouth 2 (two) times daily. What changed:    when to take this  additional instructions   docusate sodium 100 MG capsule Commonly known as:  COLACE Take 1 capsule (100 mg total) by mouth every 12 (twelve) hours.   ezetimibe-simvastatin 10-20 MG tablet Commonly known as:  VYTORIN TAKE 1/2 TABLET BY MOUTH ONCE NIGHTLY TO CONTROL CHOLESTEROL   feeding supplement (ENSURE ENLIVE) Liqd Take 237 mLs by mouth 2 (two) times daily between meals.   furosemide 20 MG tablet Commonly known as:  LASIX TAKE 1 TABLET BY MOUTH TWICE DAILY FOR HYPERTENSION   JANUVIA 100 MG tablet Generic drug:  sitaGLIPtin Take 100 mg daily by mouth. What changed:  Another medication with the same name was removed. Continue taking this medication, and follow the directions you see here.   Loteprednol Etabonate 0.5 % Gel Commonly known as:  LOTEMAX Place 1 drop into the left eye daily. PLACE 1 DROP IN THE LEFT EYE TWICE DAILY   nitroGLYCERIN 0.4 MG SL tablet Commonly known as:  NITROSTAT Place one under tongue for chest pain, up to 3 tablets   polyethylene glycol powder  powder Commonly known as:  GLYCOLAX/MIRALAX Take 17 g daily by mouth.   PROCTOZONE-HC 2.5 % rectal cream Generic drug:  hydrocortisone Place 1 application 2 (two) times daily rectally. What changed:  Another medication with the same name was changed. Make sure you understand how and when to take each.   hydrocortisone 2.5 % rectal cream Commonly known as:  ANUSOL-HC Place 1 application rectally 3 (three) times daily. What changed:  when to take this   timolol 0.5 % ophthalmic solution Commonly known as:  BETIMOL INSTILL 1 DROP INTO THE LEFT EYE DAILY      Follow-up Information    Rosalio Macadamia, NP Follow up on 03/07/2017.   Specialties:  Nurse Practitioner, Interventional Cardiology, Cardiology, Radiology Why:  Please arrive at 3:15pm for a 3:30 appointment Contact information: [address] [place] [address] [phone]        Ashley Meuse, MD. Call.   Specialty:  General Surgery Why:  Please call to make an appointment with colorectal surgeon to discuss surgery for rectal prolapse after your heart issues are evaluated Contact information: [address] [place] [address] [phone]  Allergies  Allergen Reactions  . Namzaric [Memantine Hcl-Donepezil Hcl] Nausea Only    confusion  . Percocet [Oxycodone-Acetaminophen] Other (See Comments)    loosy goosy  . Sulfa Antibiotics Other (See Comments)    Tongue swells  . Adhesive [Tape] Other (See Comments)    Tears skin.  Please use "paper" tape  . Metformin And Related Diarrhea    Abnormal Kidney Functions   . Tradjenta [Linagliptin]     Low back ache, resolved once medication stopped   . Penicillins Hives    Tolerates Ceftriaxone, Has patient had a PCN reaction causing immediate rash, facial/tongue/throat swelling, SOB or lightheadedness with hypotension: Unknown Has patient had a PCN reaction causing severe rash involving mucus membranes or skin necrosis: No Has  patient had a PCN reaction that required hospitalization No Has patient had a PCN reaction occurring within the last 10 years: No If all of the above answers are "NO", then may proceed with Cephalosporin use.     Consultations:  Surgery  Cardiology.    Procedures/Studies: Dg Chest 2 View  Result Date: 02/04/2017 CLINICAL DATA:  Bladder and rectal pain. EXAM: CHEST  2 VIEW COMPARISON:  December 09, 2016 FINDINGS: Stable cardiomegaly. The hila and mediastinum are stable. Chronic interstitial opacities remain. No new focal infiltrate. No nodule or mass. No pneumothorax. No other acute abnormalities. IMPRESSION: 1. Chronic interstitial changes are stable.  No acute abnormalities. Electronically Signed   By: Gerome Sam III M.D   On: 02/04/2017 18:05   Dg Abdomen 1 View  Result Date: 02/04/2017 CLINICAL DATA:  Bladder and rectal pain. EXAM: ABDOMEN - 1 VIEW COMPARISON:  February 01, 2017 FINDINGS: The lung bases are not well assessed. No evidence of bowel obstruction. A calcification the pelvis was noted to be uterine on previous CT imaging. No renal or ureteral stones. A vascular stent is identified. No other acute abnormalities. IMPRESSION: 1. No acute abnormalities to explain the patient's symptoms identified. The lung bases are not well visualized. Electronically Signed   By: Gerome Sam III M.D   On: 02/04/2017 18:04   Dg Abdomen 1 View  Result Date: 02/01/2017 CLINICAL DATA:  Rectal pain and abdominal pain EXAM: ABDOMEN - 1 VIEW COMPARISON:  12/09/2016, 04/02/2015, 11/26/2016 FINDINGS: Prominent right infrahilar pulmonary vessel. Borderline cardiomegaly. Streaky atelectasis or scar at the left base. Nonobstructed gas pattern. The iliac stents. Round calcification in the pelvis as before, corresponding to calcified fibroid on CT. IMPRESSION: Nonobstructed gas pattern Electronically Signed   By: Jasmine Pang M.D.   On: 02/01/2017 20:57   Ct Chest Wo Contrast  Result Date:  02/05/2017 CLINICAL DATA:  Hypoxia EXAM: CT CHEST WITHOUT CONTRAST TECHNIQUE: Multidetector CT imaging of the chest was performed following the standard protocol without IV contrast. COMPARISON:  Chest x-ray 02/04/2017 FINDINGS: Cardiovascular: There is cardiomegaly. Diffuse coronary artery and aortic calcifications. No evidence of aortic aneurysm. Mediastinum/Nodes: Mildly enlarged mediastinal lymph nodes. Precarinal lymph node has a short axis diameter 15 mm. Other small borderline scratched at other small and borderline sized mediastinal lymph nodes. No visible hilar adenopathy. No axillary adenopathy. Lungs/Pleura: Small bilateral pleural effusions. There is mild interstitial prominence best seen in the mid and lower lungs, question early interstitial edema. Bibasilar atelectasis. No confluent opacities. Upper Abdomen: Imaging into the upper abdomen shows no acute findings. Musculoskeletal: Chest wall soft tissues are unremarkable. No acute bony abnormality. IMPRESSION: Cardiomegaly, severe/diffuse coronary artery disease and aortic atherosclerosis. Small bilateral effusions with mild interstitial prominence in the mid and  lower lung zones, question early interstitial edema. Bibasilar atelectasis. Electronically Signed   By: Charlett Nose M.D.   On: 02/05/2017 08:39       Subjective: No new complaints.   Discharge Exam: Vitals:   02/08/17 2126 02/09/17 0558  BP: (!) 171/47 (!) 153/44  Pulse: (!) 51 (!) 50  Resp: 18 18  Temp: 97.6 F (36.4 C) 97.7 F (36.5 C)  SpO2: 100% 97%   Vitals:   02/08/17 1247 02/08/17 1542 02/08/17 2126 02/09/17 0558  BP: (!) 135/47 (!) 174/48 (!) 171/47 (!) 153/44  Pulse: (!) 55 (!) 51 (!) 51 (!) 50  Resp:  18 18 18   Temp: 97.8 F (36.6 C) 98.2 F (36.8 C) 97.6 F (36.4 C) 97.7 F (36.5 C)  TempSrc:  Oral Oral Oral  SpO2: 94% 99% 100% 97%  Weight:    56.5 kg (124 lb 9 oz)  Height:        General: Pt is alert, awake, not in acute  distress Cardiovascular: RRR, S1/S2 +, no rubs, no gallops Respiratory: CTA bilaterally, no wheezing, no rhonchi Abdominal: Soft, NT, ND, bowel sounds + Extremities: no edema, no cyanosis    The results of significant diagnostics from this hospitalization (including imaging, microbiology, ancillary and laboratory) are listed below for reference.     Microbiology: No results found for this or any previous visit (from the past 240 hour(s)).   Labs: BNP (last 3 results) Recent Labs    02/04/17 1950  BNP 1,024.7*   Basic Metabolic Panel: Recent Labs  Lab 02/06/17 0528 02/07/17 0526 02/08/17 0520 02/08/17 1437 02/09/17 0811  NA 139 138 136  --  139  K 3.7 3.6 3.8  --  4.3  CL 107 104 103  --  105  CO2 23 25 24   --  26  GLUCOSE 84 113* 225*  --  127*  BUN 29* 32* 38*  --  38*  CREATININE 2.10* 2.66* 2.36* 2.31* 1.97*  CALCIUM 8.2* 8.1* 8.0*  --  8.4*   Liver Function Tests: No results for input(s): AST, ALT, ALKPHOS, BILITOT, PROT, ALBUMIN in the last 168 hours. No results for input(s): LIPASE, AMYLASE in the last 168 hours. No results for input(s): AMMONIA in the last 168 hours. CBC: Recent Labs  Lab 02/08/17 0520  WBC 4.1  HGB 9.5*  HCT 29.4*  MCV 92.2  PLT 142*   Cardiac Enzymes: Recent Labs  Lab 02/05/17 1344  TROPONINI 0.04*   BNP: Invalid input(s): POCBNP CBG: Recent Labs  Lab 02/08/17 0918 02/08/17 1145 02/08/17 1648 02/08/17 2137 02/09/17 0732  GLUCAP 141* 197* 143* 192* 119*   D-Dimer No results for input(s): DDIMER in the last 72 hours. Hgb A1c No results for input(s): HGBA1C in the last 72 hours. Lipid Profile No results for input(s): CHOL, HDL, LDLCALC, TRIG, CHOLHDL, LDLDIRECT in the last 72 hours. Thyroid function studies No results for input(s): TSH, T4TOTAL, T3FREE, THYROIDAB in the last 72 hours.  Invalid input(s): FREET3 Anemia work up No results for input(s): VITAMINB12, FOLATE, FERRITIN, TIBC, IRON, RETICCTPCT in the last  72 hours. Urinalysis    Component Value Date/Time   COLORURINE YELLOW 02/04/2017 1615   COLORURINE YELLOW 02/04/2017 1615   APPEARANCEUR (A) 02/04/2017 1615    QUESTIONABLE RESULTS, RECOMMEND RECOLLECT TO VERIFY   APPEARANCEUR CLEAR 02/04/2017 1615   APPEARANCEUR Clear 02/21/2013 1622   LABSPEC  02/04/2017 1615    QUESTIONABLE RESULTS, RECOMMEND RECOLLECT TO VERIFY   LABSPEC 1.008 02/04/2017 1615  PHURINE  02/04/2017 1615    QUESTIONABLE RESULTS, RECOMMEND RECOLLECT TO VERIFY   PHURINE 5.0 02/04/2017 1615   GLUCOSEU (A) 02/04/2017 1615    QUESTIONABLE RESULTS, RECOMMEND RECOLLECT TO VERIFY   GLUCOSEU NEGATIVE 02/04/2017 1615   HGBUR (A) 02/04/2017 1615    QUESTIONABLE RESULTS, RECOMMEND RECOLLECT TO VERIFY   HGBUR SMALL (A) 02/04/2017 1615   BILIRUBINUR (A) 02/04/2017 1615    QUESTIONABLE RESULTS, RECOMMEND RECOLLECT TO VERIFY   BILIRUBINUR NEGATIVE 02/04/2017 1615   BILIRUBINUR Negative 02/21/2013 1622   KETONESUR (A) 02/04/2017 1615    QUESTIONABLE RESULTS, RECOMMEND RECOLLECT TO VERIFY   KETONESUR NEGATIVE 02/04/2017 1615   PROTEINUR (A) 02/04/2017 1615    QUESTIONABLE RESULTS, RECOMMEND RECOLLECT TO VERIFY   PROTEINUR 100 (A) 02/04/2017 1615   UROBILINOGEN 0.2 12/23/2014 1513   NITRITE (A) 02/04/2017 1615    QUESTIONABLE RESULTS, RECOMMEND RECOLLECT TO VERIFY   NITRITE NEGATIVE 02/04/2017 1615   LEUKOCYTESUR (A) 02/04/2017 1615    QUESTIONABLE RESULTS, RECOMMEND RECOLLECT TO VERIFY   LEUKOCYTESUR MODERATE (A) 02/04/2017 1615   LEUKOCYTESUR Negative 02/21/2013 1622   Sepsis Labs Invalid input(s): PROCALCITONIN,  WBC,  LACTICIDVEN Microbiology No results found for this or any previous visit (from the past 240 hour(s)).   Time coordinating discharge: Over 30 minutes  SIGNED:   Kathlen ModyVijaya Trevin Gartrell, MD  [facility] 02/12/2017, 10:38 AM Pager   If 7PM-7AM, please contact night-coverage [email or web address] Password [email or web address]

## 2017-02-13 NOTE — Patient Outreach (Signed)
CSW spoke with patient's daughter and willl close case at this time. They are equipped and aware of resources and are and encouraged to call  if needs arise.  CSW will notify PCP and [facility] of plans for case closure.    Reece LevyJanet Justis Dupas, MSW, LCSW Clinical Social Worker  [facility] [phone]

## 2017-02-14 ENCOUNTER — Other Ambulatory Visit: Payer: Self-pay | Admitting: Internal Medicine

## 2017-02-14 ENCOUNTER — Encounter: Payer: Self-pay | Admitting: Internal Medicine

## 2017-02-14 ENCOUNTER — Ambulatory Visit (INDEPENDENT_AMBULATORY_CARE_PROVIDER_SITE_OTHER): Payer: Medicare Other | Admitting: Internal Medicine

## 2017-02-14 VITALS — BP 170/74 | HR 61 | Temp 97.4°F | Ht 60.0 in | Wt 122.6 lb

## 2017-02-14 DIAGNOSIS — N183 Chronic kidney disease, stage 3 unspecified: Secondary | ICD-10-CM

## 2017-02-14 DIAGNOSIS — K623 Rectal prolapse: Secondary | ICD-10-CM | POA: Diagnosis not present

## 2017-02-14 DIAGNOSIS — F0390 Unspecified dementia without behavioral disturbance: Secondary | ICD-10-CM | POA: Diagnosis not present

## 2017-02-14 DIAGNOSIS — D631 Anemia in chronic kidney disease: Secondary | ICD-10-CM

## 2017-02-14 DIAGNOSIS — K622 Anal prolapse: Secondary | ICD-10-CM

## 2017-02-14 DIAGNOSIS — I5032 Chronic diastolic (congestive) heart failure: Secondary | ICD-10-CM

## 2017-02-14 DIAGNOSIS — E1121 Type 2 diabetes mellitus with diabetic nephropathy: Secondary | ICD-10-CM | POA: Diagnosis not present

## 2017-02-14 DIAGNOSIS — I10 Essential (primary) hypertension: Secondary | ICD-10-CM

## 2017-02-14 LAB — BASIC METABOLIC PANEL WITH GFR
BUN / CREAT RATIO: 22 (calc) (ref 6–22)
BUN: 43 mg/dL — AB (ref 7–25)
CALCIUM: 8.6 mg/dL (ref 8.6–10.4)
CHLORIDE: 109 mmol/L (ref 98–110)
CO2: 26 mmol/L (ref 20–32)
Creat: 1.96 mg/dL — ABNORMAL HIGH (ref 0.60–0.88)
GFR, Est African American: 26 mL/min/{1.73_m2} — ABNORMAL LOW (ref >=60)
GFR, Est Non African American: 22 mL/min/{1.73_m2} — ABNORMAL LOW (ref >=60)
GLUCOSE: 113 mg/dL — AB (ref 65–99)
Potassium: 4.8 mmol/L (ref 3.5–5.3)
Sodium: 142 mmol/L (ref 135–146)

## 2017-02-14 LAB — CBC
HCT: 33.6 % — ABNORMAL LOW (ref 35.0–45.0)
Hemoglobin: 10.8 g/dL — ABNORMAL LOW (ref 11.7–15.5)
MCH: 29.8 pg (ref 27.0–33.0)
MCHC: 32.1 g/dL (ref 32.0–36.0)
MCV: 92.6 fL (ref 80.0–100.0)
MPV: 10.6 fL (ref 7.5–12.5)
PLATELETS: 242 10*3/uL (ref 140–400)
RBC: 3.63 10*6/uL — AB (ref 3.80–5.10)
RDW: 12.5 % (ref 11.0–15.0)
WBC: 6.3 10*3/uL (ref 3.8–10.8)

## 2017-02-14 NOTE — Progress Notes (Signed)
Patient ID: [name], female   DOB: May 05, 1927, 83 y.o.   MRN: [id number]    Location:  [facility] Place of Service: OFFICE  Chief Complaint  Patient presents with  . Medical Management of Chronic Issues    6 Week follow up  . Follow-up    Hospital Follow up 02/04/2017-02/09/2017    HPI:  83 yo female seen today for hospital f/u. She was tx for acute/chronic dHF, rectal prolapse, UTI, A/CKD stage 3, hypoxia, DM, CAD/LBBB, accelerated HTN, abnormal CXR. EF 45-50% by 2D echo with hypokinesis c/w ischemia in RCA distribution, grade 2 DD. Lasix and ACEI held 2/2 rising Creatinine. Clonidine increased to BID. UTI tx with IV rocephin x 3 days. Sx evaluated rectal prolapse and deemed high risk for any kind of sx intervention. tx with sitz bath and pelvic floor exercises. A1c 6.2%; BNP 1024.7;Cr peaked 2.05-->1.97; albumin 3.3; Hgb 9.5 at d/c.   She has a cardio f/u appt 12/18th. Per d/c summary, she will need sx appt after cardiac issues improved.   She has not taken BP meds today yet as she just got OOB to come to her appt.  She is a poor historian due to dementia. Hx obtained from chart    Past Medical History:  Diagnosis Date  . Anemia   . Anxiety disorder   . Carotid artery occlusion   . Carotid bruit   . Chronic kidney disease (CKD), stage III (moderate) (HCC)   . Coronary artery disease    Remote PCI. Had BMS to RCA and DES stent to Diagonal in Jan. 2012   . Diastolic heart failure    EF 55 to 60% per cath in Jan 2012  . DVT (deep venous thrombosis) ([place])   . Gout   . Hyperlipidemia   . Hypertension   . Ischemic cardiomyopathy    with prior EF of 35%  . NSTEMI (non-ST elevated myocardial infarction) [facility]) Jan 2012   with PCI to RCA and DX per Dr. Burt Knack  . Obesity   . Pneumonia 2016  . Proteinuria   . Stroke [facility]) 2013   ?  mini    . TIA (transient ischemic attack)   . Type II diabetes mellitus ([place])    long standing    Past Surgical History:  Procedure Laterality  Date  . ABDOMINAL AORTAGRAM N/A 01/17/2014   Procedure: ABDOMINAL Maxcine Ham;  Surgeon: Elam Dutch, MD;  Location: [facility] CATH LAB;  Service: Cardiovascular;  Laterality: N/A;  . ANKLE FUSION Bilateral   . APPENDECTOMY    . CATARACT EXTRACTION, BILATERAL Bilateral   . CORONARY ANGIOPLASTY WITH STENT PLACEMENT  04/05/2010   distal RCA  . FRACTURE SURGERY Right 2005   bilateral ankles   . TONSILLECTOMY      Patient Care Team: Gildardo Cranker, DO as PCP - General (Internal Medicine) Burtis Junes, NP as Nurse Practitioner (Cardiology) Hayden Pedro, MD as Consulting Physician (Ophthalmology) Gayland Curry, DO as Consulting Physician (Geriatric Medicine) Elam Dutch, MD as Consulting Physician (Vascular Surgery) Ronald Lobo, MD as Consulting Physician (Gastroenterology) Sherren Mocha, MD as Consulting Physician (Cardiology) Michael Boston, MD as Consulting Physician (General Surgery)  Social History   Socioeconomic History  . Marital status: Widowed    Spouse name: Not on file  . Number of children: Not on file  . Years of education: Not on file  . Highest education level: Not on file  Social Needs  . Financial resource strain: Not on file  .  Food insecurity - worry: Not on file  . Food insecurity - inability: Not on file  . Transportation needs - medical: Not on file  . Transportation needs - non-medical: Not on file  Occupational History  . Occupation: retired OR Marine scientist  Tobacco Use  . Smoking status: Former Research scientist (life sciences)  . Smokeless tobacco: Never Used  . Tobacco comment: "smoked in my teens; quit after 1 year or so"  Substance and Sexual Activity  . Alcohol use: No    Alcohol/week: 0.0 oz  . Drug use: No  . Sexual activity: No  Other Topics Concern  . Not on file  Social History Narrative   Widow   Former smoker   Alcohol none   No Advance Directive     reports that she has quit smoking. she has never used smokeless tobacco. She reports that she  does not drink alcohol or use drugs.  Family History  Problem Relation Age of Onset  . Diabetes Daughter   . CAD Neg Hx    Family Status  Relation Name Status  . Mother  Deceased       murdered  . Father  Deceased       Died while working  . MGM  Deceased  . MGF  Deceased  . PGM  Deceased  . PGF  Deceased  . Daughter [employer]  . Daughter Shela Commons  . Sister Earlie Server Deceased       Died at 81 years old  . Neg Hx  (Not Specified)     Allergies  Allergen Reactions  . Namzaric [Memantine Hcl-Donepezil Hcl] Nausea Only    confusion  . Percocet [Oxycodone-Acetaminophen] Other (See Comments)    loosy goosy  . Sulfa Antibiotics Other (See Comments)    Tongue swells  . Adhesive [Tape] Other (See Comments)    Tears skin.  Please use "paper" tape  . Metformin And Related Diarrhea    Abnormal Kidney Functions   . Tradjenta [Linagliptin]     Low back ache, resolved once medication stopped   . Penicillins Hives    Tolerates Ceftriaxone, Has patient had a PCN reaction causing immediate rash, facial/tongue/throat swelling, SOB or lightheadedness with hypotension: Unknown Has patient had a PCN reaction causing severe rash involving mucus membranes or skin necrosis: No Has patient had a PCN reaction that required hospitalization No Has patient had a PCN reaction occurring within the last 10 years: No If all of the above answers are "NO", then may proceed with Cephalosporin use.     Medications:   Medication List        Accurate as of 02/14/17  3:39 PM. Always use your most recent med list.          aspirin EC 81 MG tablet Take 1 tablet (81 mg total) by mouth daily.   BYSTOLIC 20 MG Tabs Generic drug:  Nebivolol HCl TAKE 1 TABLET (20 MG TOTAL) BY MOUTH DAILY. FOR BLOOD PRESSURE   cloNIDine 0.1 MG tablet Commonly known as:  CATAPRES Take 1 tablet (0.1 mg total) by mouth 2 (two) times daily.   docusate sodium 100 MG capsule Commonly known as:  COLACE Take 1  capsule (100 mg total) by mouth every 12 (twelve) hours.   ezetimibe-simvastatin 10-20 MG tablet Commonly known as:  VYTORIN TAKE 1/2 TABLET BY MOUTH ONCE NIGHTLY TO CONTROL CHOLESTEROL   feeding supplement (ENSURE ENLIVE) Liqd Take 237 mLs by mouth 2 (two) times daily between meals.   furosemide 20 MG tablet  Commonly known as:  LASIX TAKE 1 TABLET BY MOUTH TWICE DAILY FOR HYPERTENSION   JANUVIA 100 MG tablet Generic drug:  sitaGLIPtin   Loteprednol Etabonate 0.5 % Gel Commonly known as:  LOTEMAX Place 1 drop into the left eye daily. PLACE 1 DROP IN THE LEFT EYE TWICE DAILY   nitroGLYCERIN 0.4 MG SL tablet Commonly known as:  NITROSTAT Place one under tongue for chest pain, up to 3 tablets   polyethylene glycol powder powder Commonly known as:  GLYCOLAX/MIRALAX   * PROCTOZONE-HC 2.5 % rectal cream Generic drug:  hydrocortisone   * hydrocortisone 2.5 % rectal cream Commonly known as:  ANUSOL-HC Place 1 application rectally 3 (three) times daily.   timolol 0.5 % ophthalmic solution Commonly known as:  BETIMOL INSTILL 1 DROP INTO THE LEFT EYE DAILY      * This list has 2 medication(s) that are the same as other medications prescribed for you. Read the directions carefully, and ask your doctor or other care provider to review them with you.          Review of Systems  Unable to perform ROS: Dementia    Vitals:   02/14/17 1519 02/14/17 1529  BP: (!) 184/70 (!) 182/68  Pulse: 61   Temp: (!) 97.4 F (36.3 C)   TempSrc: Oral   SpO2: 98%   Weight: 122 lb 9.6 oz (55.6 kg)   Height: 5' (1.524 m)    Body mass index is 23.94 kg/m.  Physical Exam  Constitutional: She appears well-developed and well-nourished.  Frail appearing in NAD  HENT:  Mouth/Throat: Oropharynx is clear and moist. No oropharyngeal exudate.  MMM; no oral thrush  Eyes: Pupils are equal, round, and reactive to light. No scleral icterus.  Neck: Neck supple. Carotid bruit is not present. No  tracheal deviation present. No thyromegaly present.  Cardiovascular: Normal rate, regular rhythm and intact distal pulses. Exam reveals gallop (S4). Exam reveals no friction rub.  Murmur (1/6 SEM-->carotid b/l) heard. No LE edema b/l. no calf TTP.   Pulmonary/Chest: Effort normal. No stridor. No respiratory distress. She has wheezes (min end expiratory). She has no rales.  Reduced BS at base b/l  Abdominal: Soft. Normal appearance and bowel sounds are normal. She exhibits no distension and no mass. There is no hepatomegaly. There is no tenderness. There is no rigidity, no rebound and no guarding. No hernia.  Musculoskeletal: She exhibits edema and deformity (kyphosis).  Lymphadenopathy:    She has no cervical adenopathy.  Neurological: She is alert.  Skin: Skin is warm and dry. No rash noted.  Contusion medial right lower lid at eyeglass contact point  Psychiatric: She has a normal mood and affect. Her behavior is normal.      Labs reviewed: Admission on 02/04/2017, Discharged on 02/09/2017  Component Date Value Ref Range Status  . Color, Urine 02/04/2017 YELLOW  YELLOW Final   QUESTIONABLE RESULTS, RECOMMEND RECOLLECT TO VERIFY  . APPearance 02/04/2017 QUESTIONABLE RESULTS, RECOMMEND RECOLLECT TO VERIFY* CLEAR Corrected   CORRECTED ON 11/17 AT 1756: PREVIOUSLY REPORTED AS HAZY  . Specific Gravity, Urine 02/04/2017 QUESTIONABLE RESULTS, RECOMMEND RECOLLECT TO VERIFY  1.005 - 1.030 Corrected   CORRECTED ON 11/17 AT 1756: PREVIOUSLY REPORTED AS 1.008  . pH 02/04/2017 QUESTIONABLE RESULTS, RECOMMEND RECOLLECT TO VERIFY  5.0 - 8.0 Corrected   CORRECTED ON 11/17 AT 1756: PREVIOUSLY REPORTED AS 5.0  . Glucose, UA 02/04/2017 QUESTIONABLE RESULTS, RECOMMEND RECOLLECT TO VERIFY* NEGATIVE mg/dL Corrected   CORRECTED ON 11/17  AT 1756: PREVIOUSLY REPORTED AS NEGATIVE  . Hgb urine dipstick 02/04/2017 QUESTIONABLE RESULTS, RECOMMEND RECOLLECT TO VERIFY* NEGATIVE Corrected   CORRECTED ON 11/17 AT  1756: PREVIOUSLY REPORTED AS SMALL  . Bilirubin Urine 02/04/2017 QUESTIONABLE RESULTS, RECOMMEND RECOLLECT TO VERIFY* NEGATIVE Corrected   CORRECTED ON 11/17 AT 1756: PREVIOUSLY REPORTED AS NEGATIVE  . Ketones, ur 02/04/2017 QUESTIONABLE RESULTS, RECOMMEND RECOLLECT TO VERIFY* NEGATIVE mg/dL Corrected   CORRECTED ON 11/17 AT 1756: PREVIOUSLY REPORTED AS NEGATIVE  . Protein, ur 02/04/2017 QUESTIONABLE RESULTS, RECOMMEND RECOLLECT TO VERIFY* NEGATIVE mg/dL Corrected   CORRECTED ON 11/17 AT 1756: PREVIOUSLY REPORTED AS 100  . Nitrite 02/04/2017 QUESTIONABLE RESULTS, RECOMMEND RECOLLECT TO VERIFY* NEGATIVE Corrected   CORRECTED ON 11/17 AT 1756: PREVIOUSLY REPORTED AS NEGATIVE  . Leukocytes, UA 02/04/2017 QUESTIONABLE RESULTS, RECOMMEND RECOLLECT TO VERIFY* NEGATIVE Corrected   CORRECTED ON 11/17 AT 1756: PREVIOUSLY REPORTED AS MODERATE  . Urine-Other 02/04/2017 QUESTIONABLE RESULTS, RECOMMEND RECOLLECT TO VERIFY   Corrected  . Sodium 02/04/2017 143  135 - 145 mmol/L Final  . Potassium 02/04/2017 4.7  3.5 - 5.1 mmol/L Final  . Chloride 02/04/2017 111  101 - 111 mmol/L Final  . CO2 02/04/2017 27  22 - 32 mmol/L Final  . Glucose, Bld 02/04/2017 104* 65 - 99 mg/dL Final  . BUN 02/04/2017 32* 6 - 20 mg/dL Final  . Creatinine, Ser 02/04/2017 2.05* 0.44 - 1.00 mg/dL Final  . Calcium 02/04/2017 8.7* 8.9 - 10.3 mg/dL Final  . Total Protein 02/04/2017 6.3* 6.5 - 8.1 g/dL Final  . Albumin 02/04/2017 3.6  3.5 - 5.0 g/dL Final  . AST 02/04/2017 13* 15 - 41 U/L Final  . ALT 02/04/2017 7* 14 - 54 U/L Final  . Alkaline Phosphatase 02/04/2017 59  38 - 126 U/L Final  . Total Bilirubin 02/04/2017 0.5  0.3 - 1.2 mg/dL Final  . GFR calc non Af Amer 02/04/2017 21* >60 mL/min Final  . GFR calc Af Amer 02/04/2017 24* >60 mL/min Final   Comment: (NOTE) The eGFR has been calculated using the CKD EPI equation. This calculation has not been validated in all clinical situations. eGFR's persistently <60 mL/min signify  possible Chronic Kidney Disease.   . Anion gap 02/04/2017 5  5 - 15 Final  . WBC 02/04/2017 7.9  4.0 - 10.5 K/uL Final  . RBC 02/04/2017 3.57* 3.87 - 5.11 MIL/uL Final  . Hemoglobin 02/04/2017 10.7* 12.0 - 15.0 g/dL Final  . HCT 02/04/2017 33.2* 36.0 - 46.0 % Final  . MCV 02/04/2017 93.0  78.0 - 100.0 fL Final  . MCH 02/04/2017 30.0  26.0 - 34.0 pg Final  . MCHC 02/04/2017 32.2  30.0 - 36.0 g/dL Final  . RDW 02/04/2017 13.9  11.5 - 15.5 % Final  . Platelets 02/04/2017 208  150 - 400 K/uL Final  . B Natriuretic Peptide 02/04/2017 1,024.7* 0.0 - 100.0 pg/mL Final  . Color, Urine 02/04/2017 YELLOW  YELLOW Final  . APPearance 02/04/2017 CLEAR  CLEAR Final  . Specific Gravity, Urine 02/04/2017 1.008  1.005 - 1.030 Final  . pH 02/04/2017 5.0  5.0 - 8.0 Final  . Glucose, UA 02/04/2017 NEGATIVE  NEGATIVE mg/dL Final  . Hgb urine dipstick 02/04/2017 SMALL* NEGATIVE Final  . Bilirubin Urine 02/04/2017 NEGATIVE  NEGATIVE Final  . Ketones, ur 02/04/2017 NEGATIVE  NEGATIVE mg/dL Final  . Protein, ur 02/04/2017 100* NEGATIVE mg/dL Final  . Nitrite 02/04/2017 NEGATIVE  NEGATIVE Final  . Leukocytes, UA 02/04/2017 MODERATE* NEGATIVE Final  . RBC /  HPF 02/04/2017 0-5  0 - 5 RBC/hpf Final  . WBC, UA 02/04/2017 6-30  0 - 5 WBC/hpf Final  . Bacteria, UA 02/04/2017 MANY* NONE SEEN Final  . Squamous Epithelial / LPF 02/04/2017 NONE SEEN  NONE SEEN Final  . WBC Clumps 02/04/2017 PRESENT   Final  . Hyaline Casts, UA 02/04/2017 PRESENT   Final  . Troponin I 02/05/2017 0.03* <0.03 ng/mL Final   Comment: CRITICAL RESULT CALLED TO, READ BACK BY AND VERIFIED WITH: C SEAY,RN _0  02/05/17 MKELLY   . Troponin I 02/05/2017 0.03* <0.03 ng/mL Final   CRITICAL VALUE NOTED.  VALUE IS CONSISTENT WITH PREVIOUSLY REPORTED AND CALLED VALUE.  . Troponin I 02/05/2017 0.04* <0.03 ng/mL Final   CRITICAL VALUE NOTED.  VALUE IS CONSISTENT WITH PREVIOUSLY REPORTED AND CALLED VALUE.  . TSH 02/05/2017 2.485  0.350 - 4.500  uIU/mL Final   Performed by a 3rd Generation assay with a functional sensitivity of <=0.01 uIU/mL.  Marland Kitchen Weight 02/05/2017 1,936.52  oz Final  . Height 02/05/2017 60  in Final  . BP 02/05/2017 179/51  mmHg Final  . Hgb A1c MFr Bld 02/05/2017 6.2* 4.8 - 5.6 % Final   Comment: (NOTE) Pre diabetes:          5.7%-6.4% Diabetes:              >6.4% Glycemic control for   <7.0% adults with diabetes   . Mean Plasma Glucose 02/05/2017 131.24  mg/dL Final   Performed at [facility], [place] [address] [place], [place] [address]  . Glucose-Capillary 02/05/2017 100* 65 - 99 mg/dL Final  . Sodium 02/05/2017 142  135 - 145 mmol/L Final  . Potassium 02/05/2017 3.5  3.5 - 5.1 mmol/L Final   DELTA CHECK NOTED  . Chloride 02/05/2017 109  101 - 111 mmol/L Final  . CO2 02/05/2017 22  22 - 32 mmol/L Final  . Glucose, Bld 02/05/2017 103* 65 - 99 mg/dL Final  . BUN 02/05/2017 28* 6 - 20 mg/dL Final  . Creatinine, Ser 02/05/2017 1.94* 0.44 - 1.00 mg/dL Final  . Calcium 02/05/2017 8.4* 8.9 - 10.3 mg/dL Final  . GFR calc non Af Amer 02/05/2017 22* >60 mL/min Final  . GFR calc Af Amer 02/05/2017 26* >60 mL/min Final   Comment: (NOTE) The eGFR has been calculated using the CKD EPI equation. This calculation has not been validated in all clinical situations. eGFR's persistently <60 mL/min signify possible Chronic Kidney Disease.   . Anion gap 02/05/2017 11  5 - 15 Final  . WBC 02/05/2017 7.6  4.0 - 10.5 K/uL Final  . RBC 02/05/2017 3.61* 3.87 - 5.11 MIL/uL Final  . Hemoglobin 02/05/2017 10.8* 12.0 - 15.0 g/dL Final  . HCT 02/05/2017 33.6* 36.0 - 46.0 % Final  . MCV 02/05/2017 93.1  78.0 - 100.0 fL Final  . MCH 02/05/2017 29.9  26.0 - 34.0 pg Final  . MCHC 02/05/2017 32.1  30.0 - 36.0 g/dL Final  . RDW 02/05/2017 14.1  11.5 - 15.5 % Final  . Platelets 02/05/2017 166  150 - 400 K/uL Final  . Neutrophils Relative % 02/05/2017 76  % Final  . Neutro Abs 02/05/2017 5.8  1.7 - 7.7 K/uL Final  . Lymphocytes  Relative 02/05/2017 14  % Final  . Lymphs Abs 02/05/2017 1.1  0.7 - 4.0 K/uL Final  . Monocytes Relative 02/05/2017 7  % Final  . Monocytes Absolute 02/05/2017 0.5  0.1 - 1.0 K/uL Final  .  Eosinophils Relative 02/05/2017 3  % Final  . Eosinophils Absolute 02/05/2017 0.2  0.0 - 0.7 K/uL Final  . Basophils Relative 02/05/2017 0  % Final  . Basophils Absolute 02/05/2017 0.0  0.0 - 0.1 K/uL Final  . Magnesium 02/05/2017 1.9  1.7 - 2.4 mg/dL Final  . Glucose-Capillary 02/05/2017 114* 65 - 99 mg/dL Final  . Glucose-Capillary 02/05/2017 103* 65 - 99 mg/dL Final  . Sodium 02/06/2017 139  135 - 145 mmol/L Final  . Potassium 02/06/2017 3.7  3.5 - 5.1 mmol/L Final  . Chloride 02/06/2017 107  101 - 111 mmol/L Final  . CO2 02/06/2017 23  22 - 32 mmol/L Final  . Glucose, Bld 02/06/2017 84  65 - 99 mg/dL Final  . BUN 02/06/2017 29* 6 - 20 mg/dL Final  . Creatinine, Ser 02/06/2017 2.10* 0.44 - 1.00 mg/dL Final  . Calcium 02/06/2017 8.2* 8.9 - 10.3 mg/dL Final  . GFR calc non Af Amer 02/06/2017 20* >60 mL/min Final  . GFR calc Af Amer 02/06/2017 23* >60 mL/min Final   Comment: (NOTE) The eGFR has been calculated using the CKD EPI equation. This calculation has not been validated in all clinical situations. eGFR's persistently <60 mL/min signify possible Chronic Kidney Disease.   . Anion gap 02/06/2017 9  5 - 15 Final  . Glucose-Capillary 02/05/2017 110* 65 - 99 mg/dL Final  . Glucose-Capillary 02/06/2017 76  65 - 99 mg/dL Final  . Glucose-Capillary 02/05/2017 87  65 - 99 mg/dL Final  . Sodium 02/07/2017 138  135 - 145 mmol/L Final  . Potassium 02/07/2017 3.6  3.5 - 5.1 mmol/L Final  . Chloride 02/07/2017 104  101 - 111 mmol/L Final  . CO2 02/07/2017 25  22 - 32 mmol/L Final  . Glucose, Bld 02/07/2017 113* 65 - 99 mg/dL Final  . BUN 02/07/2017 32* 6 - 20 mg/dL Final  . Creatinine, Ser 02/07/2017 2.66* 0.44 - 1.00 mg/dL Final  . Calcium 02/07/2017 8.1* 8.9 - 10.3 mg/dL Final  . GFR calc non Af  Amer 02/07/2017 15* >60 mL/min Final  . GFR calc Af Amer 02/07/2017 17* >60 mL/min Final   Comment: (NOTE) The eGFR has been calculated using the CKD EPI equation. This calculation has not been validated in all clinical situations. eGFR's persistently <60 mL/min signify possible Chronic Kidney Disease.   . Anion gap 02/07/2017 9  5 - 15 Final  . Glucose-Capillary 02/06/2017 118* 65 - 99 mg/dL Final  . Glucose-Capillary 02/06/2017 137* 65 - 99 mg/dL Final  . Glucose-Capillary 02/07/2017 106* 65 - 99 mg/dL Final  . Sodium 02/08/2017 136  135 - 145 mmol/L Final  . Potassium 02/08/2017 3.8  3.5 - 5.1 mmol/L Final  . Chloride 02/08/2017 103  101 - 111 mmol/L Final  . CO2 02/08/2017 24  22 - 32 mmol/L Final  . Glucose, Bld 02/08/2017 225* 65 - 99 mg/dL Final  . BUN 02/08/2017 38* 6 - 20 mg/dL Final  . Creatinine, Ser 02/08/2017 2.36* 0.44 - 1.00 mg/dL Final  . Calcium 02/08/2017 8.0* 8.9 - 10.3 mg/dL Final  . GFR calc non Af Amer 02/08/2017 17* >60 mL/min Final  . GFR calc Af Amer 02/08/2017 20* >60 mL/min Final   Comment: (NOTE) The eGFR has been calculated using the CKD EPI equation. This calculation has not been validated in all clinical situations. eGFR's persistently <60 mL/min signify possible Chronic Kidney Disease.   . Anion gap 02/08/2017 9  5 - 15 Final  . WBC  02/08/2017 4.1  4.0 - 10.5 K/uL Final  . RBC 02/08/2017 3.19* 3.87 - 5.11 MIL/uL Final  . Hemoglobin 02/08/2017 9.5* 12.0 - 15.0 g/dL Final  . HCT 02/08/2017 29.4* 36.0 - 46.0 % Final  . MCV 02/08/2017 92.2  78.0 - 100.0 fL Final  . MCH 02/08/2017 29.8  26.0 - 34.0 pg Final  . MCHC 02/08/2017 32.3  30.0 - 36.0 g/dL Final  . RDW 02/08/2017 13.7  11.5 - 15.5 % Final  . Platelets 02/08/2017 142* 150 - 400 K/uL Final  . Glucose-Capillary 02/07/2017 194* 65 - 99 mg/dL Final  . Glucose-Capillary 02/08/2017 178* 65 - 99 mg/dL Final  . Glucose-Capillary 02/08/2017 141* 65 - 99 mg/dL Final  . Creatinine, Ser 02/08/2017 2.31*  0.44 - 1.00 mg/dL Final  . GFR calc non Af Amer 02/08/2017 18* >60 mL/min Final  . GFR calc Af Amer 02/08/2017 21* >60 mL/min Final   Comment: (NOTE) The eGFR has been calculated using the CKD EPI equation. This calculation has not been validated in all clinical situations. eGFR's persistently <60 mL/min signify possible Chronic Kidney Disease.   . Glucose-Capillary 02/08/2017 197* 65 - 99 mg/dL Final  . Glucose-Capillary 02/06/2017 140* 65 - 99 mg/dL Final  . Glucose-Capillary 02/07/2017 181* 65 - 99 mg/dL Final  . Glucose-Capillary 02/07/2017 119* 65 - 99 mg/dL Final  . Glucose-Capillary 02/08/2017 143* 65 - 99 mg/dL Final  . Glucose-Capillary 02/08/2017 192* 65 - 99 mg/dL Final  . Glucose-Capillary 02/09/2017 119* 65 - 99 mg/dL Final  . Comment 1 02/09/2017 Notify RN   Final  . Sodium 02/09/2017 139  135 - 145 mmol/L Final  . Potassium 02/09/2017 4.3  3.5 - 5.1 mmol/L Final  . Chloride 02/09/2017 105  101 - 111 mmol/L Final  . CO2 02/09/2017 26  22 - 32 mmol/L Final  . Glucose, Bld 02/09/2017 127* 65 - 99 mg/dL Final  . BUN 02/09/2017 38* 6 - 20 mg/dL Final  . Creatinine, Ser 02/09/2017 1.97* 0.44 - 1.00 mg/dL Final  . Calcium 02/09/2017 8.4* 8.9 - 10.3 mg/dL Final  . GFR calc non Af Amer 02/09/2017 22* >60 mL/min Final  . GFR calc Af Amer 02/09/2017 25* >60 mL/min Final   Comment: (NOTE) The eGFR has been calculated using the CKD EPI equation. This calculation has not been validated in all clinical situations. eGFR's persistently <60 mL/min signify possible Chronic Kidney Disease.   . Anion gap 02/09/2017 8  5 - 15 Final  Admission on 02/01/2017, Discharged on 02/01/2017  Component Date Value Ref Range Status  . Color, Urine 02/01/2017 YELLOW  YELLOW Final  . APPearance 02/01/2017 CLEAR  CLEAR Final  . Specific Gravity, Urine 02/01/2017 1.011  1.005 - 1.030 Final  . pH 02/01/2017 6.0  5.0 - 8.0 Final  . Glucose, UA 02/01/2017 NEGATIVE  NEGATIVE mg/dL Final  . Hgb urine  dipstick 02/01/2017 NEGATIVE  NEGATIVE Final  . Bilirubin Urine 02/01/2017 NEGATIVE  NEGATIVE Final  . Ketones, ur 02/01/2017 NEGATIVE  NEGATIVE mg/dL Final  . Protein, ur 02/01/2017 100* NEGATIVE mg/dL Final  . Nitrite 02/01/2017 NEGATIVE  NEGATIVE Final  . Leukocytes, UA 02/01/2017 NEGATIVE  NEGATIVE Final  . RBC / HPF 02/01/2017 0-5  0 - 5 RBC/hpf Final  . WBC, UA 02/01/2017 0-5  0 - 5 WBC/hpf Final  . Bacteria, UA 02/01/2017 NONE SEEN  NONE SEEN Final  . Squamous Epithelial / LPF 02/01/2017 0-5* NONE SEEN Final  . Mucus 02/01/2017 PRESENT   Final  .  Sodium 02/01/2017 141  135 - 145 mmol/L Final  . Potassium 02/01/2017 3.9  3.5 - 5.1 mmol/L Final  . Chloride 02/01/2017 109  101 - 111 mmol/L Final  . CO2 02/01/2017 26  22 - 32 mmol/L Final  . Glucose, Bld 02/01/2017 98  65 - 99 mg/dL Final  . BUN 02/01/2017 28* 6 - 20 mg/dL Final  . Creatinine, Ser 02/01/2017 1.84* 0.44 - 1.00 mg/dL Final  . Calcium 02/01/2017 8.6* 8.9 - 10.3 mg/dL Final  . Total Protein 02/01/2017 6.1* 6.5 - 8.1 g/dL Final  . Albumin 02/01/2017 3.3* 3.5 - 5.0 g/dL Final  . AST 02/01/2017 15  15 - 41 U/L Final  . ALT 02/01/2017 9* 14 - 54 U/L Final  . Alkaline Phosphatase 02/01/2017 59  38 - 126 U/L Final  . Total Bilirubin 02/01/2017 0.5  0.3 - 1.2 mg/dL Final  . GFR calc non Af Amer 02/01/2017 24* >60 mL/min Final  . GFR calc Af Amer 02/01/2017 27* >60 mL/min Final   Comment: (NOTE) The eGFR has been calculated using the CKD EPI equation. This calculation has not been validated in all clinical situations. eGFR's persistently <60 mL/min signify possible Chronic Kidney Disease.   . Anion gap 02/01/2017 6  5 - 15 Final  . WBC 02/01/2017 6.0  4.0 - 10.5 K/uL Final  . RBC 02/01/2017 3.66* 3.87 - 5.11 MIL/uL Final  . Hemoglobin 02/01/2017 10.8* 12.0 - 15.0 g/dL Final  . HCT 02/01/2017 34.2* 36.0 - 46.0 % Final  . MCV 02/01/2017 93.4  78.0 - 100.0 fL Final  . MCH 02/01/2017 29.5  26.0 - 34.0 pg Final  . MCHC  02/01/2017 31.6  30.0 - 36.0 g/dL Final  . RDW 02/01/2017 14.0  11.5 - 15.5 % Final  . Platelets 02/01/2017 200  150 - 400 K/uL Final  Office Visit on 01/05/2017  Component Date Value Ref Range Status  . Glucose, Bld 01/05/2017 132  65 - 139 mg/dL Final   Comment: .        Non-fasting reference interval .   . BUN 01/05/2017 30* 7 - 25 mg/dL Final  . Creat 01/05/2017 1.84* 0.60 - 0.88 mg/dL Final   Comment: For patients >39 years of age, the reference limit for Creatinine is approximately 13% higher for people identified as African-American. .   . GFR, Est Non African American 01/05/2017 24* > OR = 60 mL/min/1.8m Final  . GFR, Est African American 01/05/2017 28* > OR = 60 mL/min/1.723mFinal  . BUN/Creatinine Ratio 01/05/2017 16  6 - 22 (calc) Final  . Sodium 01/05/2017 143  135 - 146 mmol/L Final  . Potassium 01/05/2017 4.8  3.5 - 5.3 mmol/L Final  . Chloride 01/05/2017 107  98 - 110 mmol/L Final  . CO2 01/05/2017 29  20 - 32 mmol/L Final  . Calcium 01/05/2017 9.0  8.6 - 10.4 mg/dL Final  Office Visit on 12/21/2016  Component Date Value Ref Range Status  . WBC 12/21/2016 4.6  3.8 - 10.8 Thousand/uL Final  . RBC 12/21/2016 3.55* 3.80 - 5.10 Million/uL Final  . Hemoglobin 12/21/2016 10.4* 11.7 - 15.5 g/dL Final  . HCT 12/21/2016 32.6* 35.0 - 45.0 % Final  . MCV 12/21/2016 91.8  80.0 - 100.0 fL Final  . MCH 12/21/2016 29.3  27.0 - 33.0 pg Final  . MCHC 12/21/2016 31.9* 32.0 - 36.0 g/dL Final  . RDW 12/21/2016 12.9  11.0 - 15.0 % Final  . Platelets 12/21/2016 206  140 -  400 Thousand/uL Final  . MPV 12/21/2016 11.2  7.5 - 12.5 fL Final  . Neutro Abs 12/21/2016 2,709  1,500 - 7,800 cells/uL Final  . Lymphs Abs 12/21/2016 1,389  850 - 3,900 cells/uL Final  . WBC mixed population 12/21/2016 345  200 - 950 cells/uL Final  . Eosinophils Absolute 12/21/2016 138  15 - 500 cells/uL Final  . Basophils Absolute 12/21/2016 18  0 - 200 cells/uL Final  . Neutrophils Relative % 12/21/2016  58.9  % Final  . Total Lymphocyte 12/21/2016 30.2  % Final  . Monocytes Relative 12/21/2016 7.5  % Final  . Eosinophils Relative 12/21/2016 3.0  % Final  . Basophils Relative 12/21/2016 0.4  % Final  . Glucose, Bld 12/21/2016 111  65 - 139 mg/dL Final   Comment: .        Non-fasting reference interval .   . BUN 12/21/2016 28* 7 - 25 mg/dL Final  . Creat 12/21/2016 2.27* 0.60 - 0.88 mg/dL Final   Comment: For patients >33 years of age, the reference limit for Creatinine is approximately 13% higher for people identified as African-American. .   . GFR, Est Non African American 12/21/2016 19* > OR = 60 mL/min/1.102m Final  . GFR, Est African American 12/21/2016 22* > OR = 60 mL/min/1.732mFinal  . BUN/Creatinine Ratio 12/21/2016 12  6 - 22 (calc) Final  . Sodium 12/21/2016 143  135 - 146 mmol/L Final  . Potassium 12/21/2016 5.1  3.5 - 5.3 mmol/L Final  . Chloride 12/21/2016 112* 98 - 110 mmol/L Final  . CO2 12/21/2016 22  20 - 32 mmol/L Final  . Calcium 12/21/2016 8.7  8.6 - 10.4 mg/dL Final  . Total Protein 12/21/2016 6.0* 6.1 - 8.1 g/dL Final  . Albumin 12/21/2016 3.5* 3.6 - 5.1 g/dL Final  . Globulin 12/21/2016 2.5  1.9 - 3.7 g/dL (calc) Final  . AG Ratio 12/21/2016 1.4  1.0 - 2.5 (calc) Final  . Total Bilirubin 12/21/2016 0.5  0.2 - 1.2 mg/dL Final  . Alkaline phosphatase (APISO) 12/21/2016 50  33 - 130 U/L Final  . AST 12/21/2016 16  10 - 35 U/L Final  . ALT 12/21/2016 6  6 - 29 U/L Final  Admission on 12/08/2016, Discharged on 12/11/2016  Component Date Value Ref Range Status  . WBC 12/09/2016 4.3  4.0 - 10.5 K/uL Final  . RBC 12/09/2016 3.69* 3.87 - 5.11 MIL/uL Final  . Hemoglobin 12/09/2016 11.0* 12.0 - 15.0 g/dL Final  . HCT 12/09/2016 34.4* 36.0 - 46.0 % Final  . MCV 12/09/2016 93.2  78.0 - 100.0 fL Final  . MCH 12/09/2016 29.8  26.0 - 34.0 pg Final  . MCHC 12/09/2016 32.0  30.0 - 36.0 g/dL Final  . RDW 12/09/2016 14.6  11.5 - 15.5 % Final  . Platelets 12/09/2016 218   150 - 400 K/uL Final  . Neutrophils Relative % 12/09/2016 55  % Final  . Neutro Abs 12/09/2016 2.4  1.7 - 7.7 K/uL Final  . Lymphocytes Relative 12/09/2016 32  % Final  . Lymphs Abs 12/09/2016 1.4  0.7 - 4.0 K/uL Final  . Monocytes Relative 12/09/2016 9  % Final  . Monocytes Absolute 12/09/2016 0.4  0.1 - 1.0 K/uL Final  . Eosinophils Relative 12/09/2016 3  % Final  . Eosinophils Absolute 12/09/2016 0.1  0.0 - 0.7 K/uL Final  . Basophils Relative 12/09/2016 1  % Final  . Basophils Absolute 12/09/2016 0.0  0.0 - 0.1 K/uL Final  .  Sodium 12/09/2016 146* 135 - 145 mmol/L Final  . Potassium 12/09/2016 3.3* 3.5 - 5.1 mmol/L Final  . Chloride 12/09/2016 113* 101 - 111 mmol/L Final  . CO2 12/09/2016 23  22 - 32 mmol/L Final  . Glucose, Bld 12/09/2016 98  65 - 99 mg/dL Final  . BUN 12/09/2016 28* 6 - 20 mg/dL Final  . Creatinine, Ser 12/09/2016 2.53* 0.44 - 1.00 mg/dL Final  . Calcium 12/09/2016 8.5* 8.9 - 10.3 mg/dL Final  . Total Protein 12/09/2016 6.1* 6.5 - 8.1 g/dL Final  . Albumin 12/09/2016 3.4* 3.5 - 5.0 g/dL Final  . AST 12/09/2016 20  15 - 41 U/L Final  . ALT 12/09/2016 10* 14 - 54 U/L Final  . Alkaline Phosphatase 12/09/2016 52  38 - 126 U/L Final  . Total Bilirubin 12/09/2016 0.4  0.3 - 1.2 mg/dL Final  . GFR calc non Af Amer 12/09/2016 16* >60 mL/min Final  . GFR calc Af Amer 12/09/2016 19* >60 mL/min Final   Comment: (NOTE) The eGFR has been calculated using the CKD EPI equation. This calculation has not been validated in all clinical situations. eGFR's persistently <60 mL/min signify possible Chronic Kidney Disease.   . Anion gap 12/09/2016 10  5 - 15 Final  . C Diff antigen 12/09/2016 NEGATIVE  NEGATIVE Final  . C Diff toxin 12/09/2016 NEGATIVE  NEGATIVE Final  . C Diff interpretation 12/09/2016 No C. difficile detected.   Final  . Campylobacter species 12/09/2016 NOT DETECTED  NOT DETECTED Final  . Plesimonas shigelloides 12/09/2016 NOT DETECTED  NOT DETECTED Final  .  Salmonella species 12/09/2016 NOT DETECTED  NOT DETECTED Final  . Yersinia enterocolitica 12/09/2016 NOT DETECTED  NOT DETECTED Final  . Vibrio species 12/09/2016 NOT DETECTED  NOT DETECTED Final  . Vibrio cholerae 12/09/2016 NOT DETECTED  NOT DETECTED Final  . Enteroaggregative E coli (EAEC) 12/09/2016 NOT DETECTED  NOT DETECTED Final  . Enteropathogenic E coli (EPEC) 12/09/2016 NOT DETECTED  NOT DETECTED Final  . Enterotoxigenic E coli (ETEC) 12/09/2016 NOT DETECTED  NOT DETECTED Final  . Shiga like toxin producing E coli * 12/09/2016 NOT DETECTED  NOT DETECTED Final  . Shigella/Enteroinvasive E coli (EI* 12/09/2016 NOT DETECTED  NOT DETECTED Final  . Cryptosporidium 12/09/2016 NOT DETECTED  NOT DETECTED Final  . Cyclospora cayetanensis 12/09/2016 NOT DETECTED  NOT DETECTED Final  . Entamoeba histolytica 12/09/2016 NOT DETECTED  NOT DETECTED Final  . Giardia lamblia 12/09/2016 NOT DETECTED  NOT DETECTED Final  . Adenovirus F40/41 12/09/2016 NOT DETECTED  NOT DETECTED Final  . Astrovirus 12/09/2016 NOT DETECTED  NOT DETECTED Final  . Norovirus GI/GII 12/09/2016 NOT DETECTED  NOT DETECTED Final  . Rotavirus A 12/09/2016 NOT DETECTED  NOT DETECTED Final  . Sapovirus (I, II, IV, and V) 12/09/2016 NOT DETECTED  NOT DETECTED Final  . Sodium 12/09/2016 147* 135 - 145 mmol/L Final  . Potassium 12/09/2016 3.1* 3.5 - 5.1 mmol/L Final  . Chloride 12/09/2016 116* 101 - 111 mmol/L Final  . CO2 12/09/2016 22  22 - 32 mmol/L Final  . Glucose, Bld 12/09/2016 91  65 - 99 mg/dL Final  . BUN 12/09/2016 25* 6 - 20 mg/dL Final  . Creatinine, Ser 12/09/2016 2.29* 0.44 - 1.00 mg/dL Final  . Calcium 12/09/2016 8.0* 8.9 - 10.3 mg/dL Final  . GFR calc non Af Amer 12/09/2016 18* >60 mL/min Final  . GFR calc Af Amer 12/09/2016 21* >60 mL/min Final   Comment: (NOTE) The eGFR has been  calculated using the CKD EPI equation. This calculation has not been validated in all clinical situations. eGFR's persistently <60  mL/min signify possible Chronic Kidney Disease.   . Anion gap 12/09/2016 9  5 - 15 Final  . WBC 12/09/2016 4.6  4.0 - 10.5 K/uL Final  . RBC 12/09/2016 3.37* 3.87 - 5.11 MIL/uL Final  . Hemoglobin 12/09/2016 10.2* 12.0 - 15.0 g/dL Final  . HCT 12/09/2016 31.3* 36.0 - 46.0 % Final  . MCV 12/09/2016 92.9  78.0 - 100.0 fL Final  . MCH 12/09/2016 30.3  26.0 - 34.0 pg Final  . MCHC 12/09/2016 32.6  30.0 - 36.0 g/dL Final  . RDW 12/09/2016 14.6  11.5 - 15.5 % Final  . Platelets 12/09/2016 195  150 - 400 K/uL Final  . Glucose-Capillary 12/09/2016 101* 65 - 99 mg/dL Final  . Glucose-Capillary 12/09/2016 96  65 - 99 mg/dL Final  . Glucose-Capillary 12/09/2016 135* 65 - 99 mg/dL Final  . Glucose-Capillary 12/09/2016 131* 65 - 99 mg/dL Final  . Sodium 12/10/2016 144  135 - 145 mmol/L Final  . Potassium 12/10/2016 3.3* 3.5 - 5.1 mmol/L Final  . Chloride 12/10/2016 114* 101 - 111 mmol/L Final  . CO2 12/10/2016 21* 22 - 32 mmol/L Final  . Glucose, Bld 12/10/2016 105* 65 - 99 mg/dL Final  . BUN 12/10/2016 22* 6 - 20 mg/dL Final  . Creatinine, Ser 12/10/2016 2.01* 0.44 - 1.00 mg/dL Final  . Calcium 12/10/2016 8.1* 8.9 - 10.3 mg/dL Final  . GFR calc non Af Amer 12/10/2016 21* >60 mL/min Final  . GFR calc Af Amer 12/10/2016 25* >60 mL/min Final   Comment: (NOTE) The eGFR has been calculated using the CKD EPI equation. This calculation has not been validated in all clinical situations. eGFR's persistently <60 mL/min signify possible Chronic Kidney Disease.   . Anion gap 12/10/2016 9  5 - 15 Final  . Magnesium 12/10/2016 1.7  1.7 - 2.4 mg/dL Final  . Glucose-Capillary 12/10/2016 91  65 - 99 mg/dL Final  . Glucose-Capillary 12/10/2016 122* 65 - 99 mg/dL Final  . Sodium 12/11/2016 142  135 - 145 mmol/L Final  . Potassium 12/11/2016 4.4  3.5 - 5.1 mmol/L Final   DELTA CHECK NOTED  . Chloride 12/11/2016 116* 101 - 111 mmol/L Final  . CO2 12/11/2016 19* 22 - 32 mmol/L Final  . Glucose, Bld  12/11/2016 146* 65 - 99 mg/dL Final  . BUN 12/11/2016 25* 6 - 20 mg/dL Final  . Creatinine, Ser 12/11/2016 1.82* 0.44 - 1.00 mg/dL Final  . Calcium 12/11/2016 8.1* 8.9 - 10.3 mg/dL Final  . GFR calc non Af Amer 12/11/2016 24* >60 mL/min Final  . GFR calc Af Amer 12/11/2016 28* >60 mL/min Final   Comment: (NOTE) The eGFR has been calculated using the CKD EPI equation. This calculation has not been validated in all clinical situations. eGFR's persistently <60 mL/min signify possible Chronic Kidney Disease.   . Anion gap 12/11/2016 7  5 - 15 Final  . Glucose-Capillary 12/10/2016 200* 65 - 99 mg/dL Final  . Glucose-Capillary 12/10/2016 149* 65 - 99 mg/dL Final  . Glucose-Capillary 12/11/2016 118* 65 - 99 mg/dL Final  . Glucose-Capillary 12/11/2016 179* 65 - 99 mg/dL Final  Appointment on 12/06/2016  Component Date Value Ref Range Status  . Glucose, Bld 12/06/2016 123  65 - 139 mg/dL Final   Comment: .        Non-fasting reference interval .   . BUN 12/06/2016 33*  7 - 25 mg/dL Final  . Creat 12/06/2016 2.88* 0.60 - 0.88 mg/dL Final   Comment: For patients >43 years of age, the reference limit for Creatinine is approximately 13% higher for people identified as African-American. .   . GFR, Est Non African American 12/06/2016 14* > OR = 60 mL/min/1.8m Final  . GFR, Est African American 12/06/2016 16* > OR = 60 mL/min/1.721mFinal  . BUN/Creatinine Ratio 12/06/2016 11  6 - 22 (calc) Final  . Sodium 12/06/2016 139  135 - 146 mmol/L Final  . Potassium 12/06/2016 5.4* 3.5 - 5.3 mmol/L Final  . Chloride 12/06/2016 108  98 - 110 mmol/L Final  . CO2 12/06/2016 22  20 - 32 mmol/L Final  . Calcium 12/06/2016 8.5* 8.6 - 10.4 mg/dL Final  Office Visit on 11/29/2016  Component Date Value Ref Range Status  . Glucose, Bld 11/30/2016 190* 65 - 139 mg/dL Final   Comment: .        Non-fasting reference interval .   . BUN 11/30/2016 27* 7 - 25 mg/dL Final  . Creat 11/30/2016 2.30* 0.60 - 0.88  mg/dL Final   Comment: For patients >4969ears of age, the reference limit for Creatinine is approximately 13% higher for people identified as African-American. .   . GFR, Est Non African American 11/30/2016 18* > OR = 60 mL/min/1.7363minal  . GFR, Est African American 11/30/2016 21* > OR = 60 mL/min/1.71m40mnal  . BUN/Creatinine Ratio 11/30/2016 12  6 - 22 (calc) Final  . Sodium 11/30/2016 141  135 - 146 mmol/L Final  . Potassium 11/30/2016 5.7* 3.5 - 5.3 mmol/L Final   Specimen slightly hemolyzed.  . Chloride 11/30/2016 109  98 - 110 mmol/L Final  . CO2 11/30/2016 24  20 - 32 mmol/L Final  . Calcium 11/30/2016 8.7  8.6 - 10.4 mg/dL Final  Admission on 11/26/2016, Discharged on 11/26/2016  Component Date Value Ref Range Status  . Sodium 11/26/2016 143  135 - 145 mmol/L Final  . Potassium 11/26/2016 4.6  3.5 - 5.1 mmol/L Final  . Chloride 11/26/2016 111  101 - 111 mmol/L Final  . CO2 11/26/2016 23  22 - 32 mmol/L Final  . Glucose, Bld 11/26/2016 124* 65 - 99 mg/dL Final  . BUN 11/26/2016 45* 6 - 20 mg/dL Final  . Creatinine, Ser 11/26/2016 2.14* 0.44 - 1.00 mg/dL Final  . Calcium 11/26/2016 9.0  8.9 - 10.3 mg/dL Final  . Total Protein 11/26/2016 6.9  6.5 - 8.1 g/dL Final  . Albumin 11/26/2016 3.6  3.5 - 5.0 g/dL Final  . AST 11/26/2016 16  15 - 41 U/L Final  . ALT 11/26/2016 9* 14 - 54 U/L Final  . Alkaline Phosphatase 11/26/2016 70  38 - 126 U/L Final  . Total Bilirubin 11/26/2016 0.6  0.3 - 1.2 mg/dL Final  . GFR calc non Af Amer 11/26/2016 20* >60 mL/min Final  . GFR calc Af Amer 11/26/2016 23* >60 mL/min Final   Comment: (NOTE) The eGFR has been calculated using the CKD EPI equation. This calculation has not been validated in all clinical situations. eGFR's persistently <60 mL/min signify possible Chronic Kidney Disease.   . Anion gap 11/26/2016 9  5 - 15 Final  . WBC 11/26/2016 5.0  4.0 - 10.5 K/uL Final  . RBC 11/26/2016 4.00  3.87 - 5.11 MIL/uL Final  . Hemoglobin  11/26/2016 12.0  12.0 - 15.0 g/dL Final  . HCT 11/26/2016 37.0  36.0 - 46.0 %  Final  . MCV 11/26/2016 92.5  78.0 - 100.0 fL Final  . MCH 11/26/2016 30.0  26.0 - 34.0 pg Final  . MCHC 11/26/2016 32.4  30.0 - 36.0 g/dL Final  . RDW 11/26/2016 14.1  11.5 - 15.5 % Final  . Platelets 11/26/2016 226  150 - 400 K/uL Final  . Neutrophils Relative % 11/26/2016 60  % Final  . Neutro Abs 11/26/2016 3.0  1.7 - 7.7 K/uL Final  . Lymphocytes Relative 11/26/2016 28  % Final  . Lymphs Abs 11/26/2016 1.4  0.7 - 4.0 K/uL Final  . Monocytes Relative 11/26/2016 10  % Final  . Monocytes Absolute 11/26/2016 0.5  0.1 - 1.0 K/uL Final  . Eosinophils Relative 11/26/2016 2  % Final  . Eosinophils Absolute 11/26/2016 0.1  0.0 - 0.7 K/uL Final  . Basophils Relative 11/26/2016 0  % Final  . Basophils Absolute 11/26/2016 0.0  0.0 - 0.1 K/uL Final  . Color, Urine 11/26/2016 YELLOW  YELLOW Final  . APPearance 11/26/2016 CLOUDY* CLEAR Final  . Specific Gravity, Urine 11/26/2016 1.011  1.005 - 1.030 Final  . pH 11/26/2016 6.0  5.0 - 8.0 Final  . Glucose, UA 11/26/2016 NEGATIVE  NEGATIVE mg/dL Final  . Hgb urine dipstick 11/26/2016 SMALL* NEGATIVE Final  . Bilirubin Urine 11/26/2016 NEGATIVE  NEGATIVE Final  . Ketones, ur 11/26/2016 NEGATIVE  NEGATIVE mg/dL Final  . Protein, ur 11/26/2016 100* NEGATIVE mg/dL Final  . Nitrite 11/26/2016 NEGATIVE  NEGATIVE Final  . Leukocytes, UA 11/26/2016 LARGE* NEGATIVE Final  . RBC / HPF 11/26/2016 0-5  0 - 5 RBC/hpf Final  . WBC, UA 11/26/2016 TOO NUMEROUS TO COUNT  0 - 5 WBC/hpf Final  . Bacteria, UA 11/26/2016 MANY* NONE SEEN Final  . Squamous Epithelial / LPF 11/26/2016 0-5* NONE SEEN Final  . WBC Clumps 11/26/2016 PRESENT   Final  . Lipase 11/26/2016 18  11 - 51 U/L Final  . Specimen Description 11/26/2016 URINE, RANDOM   Final  . Special Requests 11/26/2016 NONE   Final  . Culture 11/26/2016 >=100,000 COLONIES/mL KLEBSIELLA PNEUMONIAE*  Final  . Report Status 11/26/2016  11/29/2016 FINAL   Final  . Organism ID, Bacteria 11/26/2016 KLEBSIELLA PNEUMONIAE*  Final    Dg Chest 2 View  Result Date: 02/04/2017 CLINICAL DATA:  Bladder and rectal pain. EXAM: CHEST  2 VIEW COMPARISON:  December 09, 2016 FINDINGS: Stable cardiomegaly. The hila and mediastinum are stable. Chronic interstitial opacities remain. No new focal infiltrate. No nodule or mass. No pneumothorax. No other acute abnormalities. IMPRESSION: 1. Chronic interstitial changes are stable.  No acute abnormalities. Electronically Signed   By: Dorise Bullion III M.D   On: 02/04/2017 18:05   Dg Abdomen 1 View  Result Date: 02/04/2017 CLINICAL DATA:  Bladder and rectal pain. EXAM: ABDOMEN - 1 VIEW COMPARISON:  February 01, 2017 FINDINGS: The lung bases are not well assessed. No evidence of bowel obstruction. A calcification the pelvis was noted to be uterine on previous CT imaging. No renal or ureteral stones. A vascular stent is identified. No other acute abnormalities. IMPRESSION: 1. No acute abnormalities to explain the patient's symptoms identified. The lung bases are not well visualized. Electronically Signed   By: Dorise Bullion III M.D   On: 02/04/2017 18:04   Dg Abdomen 1 View  Result Date: 02/01/2017 CLINICAL DATA:  Rectal pain and abdominal pain EXAM: ABDOMEN - 1 VIEW COMPARISON:  12/09/2016, 04/02/2015, 11/26/2016 FINDINGS: Prominent right infrahilar pulmonary vessel. Borderline cardiomegaly. Streaky atelectasis  or scar at the left base. Nonobstructed gas pattern. The iliac stents. Round calcification in the pelvis as before, corresponding to calcified fibroid on CT. IMPRESSION: Nonobstructed gas pattern Electronically Signed   By: Donavan Foil M.D.   On: 02/01/2017 20:57   Ct Chest Wo Contrast  Result Date: 02/05/2017 CLINICAL DATA:  Hypoxia EXAM: CT CHEST WITHOUT CONTRAST TECHNIQUE: Multidetector CT imaging of the chest was performed following the standard protocol without IV contrast.  COMPARISON:  Chest x-ray 02/04/2017 FINDINGS: Cardiovascular: There is cardiomegaly. Diffuse coronary artery and aortic calcifications. No evidence of aortic aneurysm. Mediastinum/Nodes: Mildly enlarged mediastinal lymph nodes. Precarinal lymph node has a short axis diameter 15 mm. Other small borderline scratched at other small and borderline sized mediastinal lymph nodes. No visible hilar adenopathy. No axillary adenopathy. Lungs/Pleura: Small bilateral pleural effusions. There is mild interstitial prominence best seen in the mid and lower lungs, question early interstitial edema. Bibasilar atelectasis. No confluent opacities. Upper Abdomen: Imaging into the upper abdomen shows no acute findings. Musculoskeletal: Chest wall soft tissues are unremarkable. No acute bony abnormality. IMPRESSION: Cardiomegaly, severe/diffuse coronary artery disease and aortic atherosclerosis. Small bilateral effusions with mild interstitial prominence in the mid and lower lung zones, question early interstitial edema. Bibasilar atelectasis. Electronically Signed   By: Rolm Baptise M.D.   On: 02/05/2017 08:39     Assessment/Plan   ICD-10-CM   1. Essential hypertension I10    uncontrolled 2/2 medication noncompliance  2. Rectal prolapse K62.3   3. Chronic diastolic heart failure, NYHA class 1 (HCC) I50.32   4. Chronic kidney disease, stage III (moderate) (HCC) N18.3 BMP with eGFR  5. Anemia due to stage 3 chronic kidney disease (HCC) N18.3 CBC (no diff)   D63.1   6. Dementia without behavioral disturbance, unspecified dementia type F03.90   7. Diabetes mellitus with nephropathy (HCC) E11.21    Clonidine 0.63m tab x 1 given in the office today due to BP 170/120; repeat 170/74 15 minutes after dose  Continue ALL medications as ordered  Follow up with specialists as scheduled  Will call with lab results  Follow up in 2 mos for HTN, dementia, DM, HF   Tal Kempker S. CPerlie Gold PRiver Valley Behavioral [facility] and Adult Medicine [address] [phone] (Monday-Friday 8 AM - 5 PM) ([phone] 5 PM and follow prompts

## 2017-02-14 NOTE — Patient Instructions (Addendum)
Clonidine 0.1mg  tab x 1 given in the office today due to BP 170/100  Continue ALL medications as ordered  Follow up with specialists as scheduled  Will call with lab results  Follow up in 2 mos for HTN, dementia, DM, HF

## 2017-02-15 NOTE — Progress Notes (Signed)
Cardiology Office Note   Date:  02/16/2017   ID:  Ashley RheinDoris E Meadow, DOB 30-May-1927, MRN [id number]  PCP:  Kirt Boysarter, Monica, DO  Cardiologist:  Dr. Precious Bardooper/L. Tyrone SageGerhardt, NP    Chief Complaint  Patient presents with  . Hospitalization Follow-up      History of Present Illness: [name] is a 83 y.o. female who presents for post hospitalization 02/04/17 to 02/09/17 for rectal pain and constipation. She also had volume overload. CCS consulted and no surgery planned for now. Cards saw for CHF.  She has a hx of BMS RCA and DES Diag 03/2010.DM, HTN, HLD,D-CHF w/EF 55-60% per cath 2012(35%in 2000), PVD (Dr. Fields)w/bilateralCIAstenting 12/2013, gout, CKDIII,chronic anemia,LBBB.    Increased Cr with diuresing lasix held in hospital and then resumed at discharge lasix 20 BID. Her ARB was stopped due to elevated Cr and her clonidine was increased to BID.    Today she is more stressed that she is on so much medicine.  We discussed it may keep her out of the hospital.  Her rectum is still painful.  No chest pain No SOB  Mild lower ext edema.  Her BP is elevated today but she tells me she is only taking the clonidine and lasix once per day.  We discussed this.  Will try the BID does for both and if BP is still elevated she may need to resume the losartan.  Her Cr. Is back to baseline.  Hgb is 10.8 a little lower but has been this at times.    Her wt prior to discharge was 117 lbs and today 123 Lbs though she has winter clothes on today.    Past Medical History:  Diagnosis Date  . Anemia   . Anxiety disorder   . Carotid artery occlusion   . Carotid bruit   . Chronic kidney disease (CKD), stage III (moderate) (HCC)   . Coronary artery disease    Remote PCI. Had BMS to RCA and DES stent to Diagonal in Jan. 2012   . Diastolic heart failure    EF 55 to 60% per cath in Jan 2012  . DVT (deep venous thrombosis) (HCC)   . Gout   . Hyperlipidemia   . Hypertension   . Ischemic  cardiomyopathy    with prior EF of 35%  . NSTEMI (non-ST elevated myocardial infarction) [facility](HCC) Jan 2012   with PCI to RCA and DX per Dr. Excell Seltzerooper  . Obesity   . Pneumonia 2016  . Proteinuria   . Stroke [facility](HCC) 2013   ?  mini    . TIA (transient ischemic attack)   . Type II diabetes mellitus (HCC)    long standing    Past Surgical History:  Procedure Laterality Date  . ABDOMINAL AORTAGRAM N/A 01/17/2014   Procedure: ABDOMINAL Ronny FlurryAORTAGRAM;  Surgeon: Sherren Kernsharles E Fields, MD;  [facility];  Service: Cardiovascular;  Laterality: N/A;  . ANKLE FUSION Bilateral   . APPENDECTOMY    . CATARACT EXTRACTION, BILATERAL Bilateral   . CORONARY ANGIOPLASTY WITH STENT PLACEMENT  04/05/2010   distal RCA  . FRACTURE SURGERY Right 2005   bilateral ankles   . TONSILLECTOMY       Current Outpatient Medications  Medication Sig Dispense Refill  . aspirin EC 81 MG tablet Take 1 tablet (81 mg total) by mouth daily. 30 tablet 0  . BYSTOLIC 20 MG TABS TAKE 1 TABLET (20 MG TOTAL) BY MOUTH DAILY. FOR BLOOD PRESSURE 30 tablet 2  .  cloNIDine (CATAPRES) 0.1 MG tablet Take 1 tablet (0.1 mg total) by mouth 2 (two) times daily. 60 tablet 11  . docusate sodium (COLACE) 100 MG capsule Take 1 capsule (100 mg total) by mouth every 12 (twelve) hours. 60 capsule 0  . ezetimibe-simvastatin (VYTORIN) 10-20 MG tablet TAKE 1/2 TABLET BY MOUTH ONCE NIGHTLY TO CONTROL CHOLESTEROL 15 tablet 2  . feeding supplement, ENSURE ENLIVE, (ENSURE ENLIVE) LIQD Take 237 mLs by mouth 2 (two) times daily between meals. 237 mL 12  . furosemide (LASIX) 20 MG tablet TAKE 1 TABLET BY MOUTH TWICE DAILY FOR HYPERTENSION 60 tablet 2  . hydrocortisone (ANUSOL-HC) 2.5 % rectal cream Place 1 application rectally 3 (three) times daily. 30 g 0  . JANUVIA 100 MG tablet Take 100 mg daily by mouth.     . Loteprednol Etabonate (LOTEMAX) 0.5 % GEL Place 1 drop into the left eye daily. PLACE 1 DROP IN THE LEFT EYE TWICE DAILY 5 g 0  . nitroGLYCERIN  (NITROSTAT) 0.4 MG SL tablet Place one under tongue for chest pain, up to 3 tablets 30 tablet 3  . polyethylene glycol powder (GLYCOLAX/MIRALAX) powder Take 17 g daily by mouth.     Marland Kitchen. PROCTOZONE-HC 2.5 % rectal cream Place 1 application 2 (two) times daily rectally.     . timolol (BETIMOL) 0.5 % ophthalmic solution INSTILL 1 DROP INTO THE LEFT EYE DAILY 10 mL 1   No current facility-administered medications for this visit.     Allergies:   Namzaric [memantine hcl-donepezil hcl]; Percocet [oxycodone-acetaminophen]; Sulfa antibiotics; Adhesive [tape]; Metformin and related; Tradjenta [linagliptin]; and Penicillins    Social History:  The patient  reports that she has quit smoking. she has never used smokeless tobacco. She reports that she does not drink alcohol or use drugs.   Family History:  The patient's family history includes Diabetes in her daughter.    ROS:  General:no colds or fevers, mild weight increase. Skin:no rashes or ulcers HEENT:no blurred vision, no congestion CV:see HPI PUL:see HPI GI:no diarrhea constipation or melena, no indigestion GU:no hematuria, no dysuria MS:no joint pain, no claudication, + lower leg pain in both legs Neuro:no syncope, no lightheadedness Endo:+ diabetes per PCP, no thyroid disease  Wt Readings from Last 3 Encounters:  02/16/17 123 lb 6.4 oz (56 kg)  02/14/17 122 lb 9.6 oz (55.6 kg)  02/09/17 124 lb 9 oz (56.5 kg)     PHYSICAL EXAM: VS:  BP (!) 160/62   Pulse 68   Resp 16   Ht 5' (1.524 m)   Wt 123 lb 6.4 oz (56 kg)   SpO2 96%   BMI 24.10 kg/m  , BMI Body mass index is 24.1 kg/m. General:Pleasant affect, NAD Skin:Warm and dry, brisk capillary refill HEENT:normocephalic, sclera clear, mucus membranes moist Neck:supple, no JVD, no bruits  Heart:S1S2 RRR without murmur, gallup, rub or click Lungs:clear without rales, rhonchi, or wheezes ZOX:WRUEAbd:soft, non tender, + BS, do not palpate liver spleen or masses Ext:tr lower ext edema,  2+  radial pulses Neuro:alert and oriented though repeats herself at times, hx dementia, MAE, follows commands, + facial symmetry    EKG:  EKG is NOT ordered today.   Recent Labs: 02/04/2017: ALT 7; B Natriuretic Peptide 1,024.7 02/05/2017: Magnesium 1.9; TSH 2.485 02/14/2017: BUN 43; Creat 1.96; Hemoglobin 10.8; Platelets 242; Potassium 4.8; Sodium 142    Lipid Panel    Component Value Date/Time   CHOL 130 08/29/2016 1051   CHOL 143 03/09/2015 1129  TRIG 196 (H) 08/29/2016 1051   HDL 39 (L) 08/29/2016 1051   HDL 35 (L) 03/09/2015 1129   CHOLHDL 3.3 08/29/2016 1051   VLDL 39 (H) 08/29/2016 1051   LDLCALC 52 08/29/2016 1051   LDLCALC 74 03/09/2015 1129       Other studies Reviewed: Additional studies/ records that were reviewed today include:  Echo 02/05/17  Study Conclusions  - Left ventricle: The cavity size was normal. Wall thickness was   normal. Systolic function was mildly reduced. The estimated   ejection fraction was in the range of 45% to 50%. Hypokinesis of   the basal-midinferior and inferoseptal myocardium; consistent   with ischemia in the distribution of the right coronary artery.   Features are consistent with a pseudonormal left ventricular   filling pattern, with concomitant abnormal relaxation and   increased filling pressure (grade 2 diastolic dysfunction). - Mitral valve: Calcified annulus. There was mild regurgitation. - Left atrium: The atrium was severely dilated. - Pulmonary arteries: PA peak pressure: 31 mm Hg (S).   ASSESSMENT AND PLAN:  1.  Chronic diastolic HF post hospital, no SOB, tr edema, but tells me she has only been taking her lasix once a day.  She will increase to BID.  2.  HTN elevated today but only taking clonidine once a day.  She will increase to BID.  If she cannot remember to do this then we will resume losartan.  She has follow up 03/07/17 with Norma Fredrickson.   3.  CKD-3 cr back to 1.96.     4.   CAD no chest pain,  mild elevated troponin with diastolic HF of 0.03 which seems to be her baseline. With no angina will not plan nuc studies.   5.   PVD followed by Dr. Darrick Penna some ankle area pain   6.  Rectal prolapse has cream and sitz baths.  7. Dementia - we circled meds she should take BID on AVS.      Current medicines are reviewed with the patient today.  The patient Has no concerns regarding medicines.  The following changes have been made:  See above Labs/ tests ordered today include:see above  Disposition:   FU:  see above  Signed, Nada Boozer, NP  02/16/2017 11:41 AM    [facility] [address] [place], [place], [place]  [address]/ [address] [place], [place] Phone: [phone]; Fax: [phone]  [phone]

## 2017-02-16 ENCOUNTER — Other Ambulatory Visit: Payer: Self-pay

## 2017-02-16 ENCOUNTER — Ambulatory Visit (INDEPENDENT_AMBULATORY_CARE_PROVIDER_SITE_OTHER): Payer: Medicare Other | Admitting: Cardiology

## 2017-02-16 ENCOUNTER — Encounter: Payer: Self-pay | Admitting: Cardiology

## 2017-02-16 VITALS — BP 160/62 | HR 68 | Resp 16 | Ht 60.0 in | Wt 123.4 lb

## 2017-02-16 DIAGNOSIS — I5032 Chronic diastolic (congestive) heart failure: Secondary | ICD-10-CM

## 2017-02-16 DIAGNOSIS — I10 Essential (primary) hypertension: Secondary | ICD-10-CM

## 2017-02-16 DIAGNOSIS — I2510 Atherosclerotic heart disease of native coronary artery without angina pectoris: Secondary | ICD-10-CM | POA: Diagnosis not present

## 2017-02-16 DIAGNOSIS — F0390 Unspecified dementia without behavioral disturbance: Secondary | ICD-10-CM

## 2017-02-16 DIAGNOSIS — N183 Chronic kidney disease, stage 3 unspecified: Secondary | ICD-10-CM

## 2017-02-16 DIAGNOSIS — K623 Rectal prolapse: Secondary | ICD-10-CM

## 2017-02-16 DIAGNOSIS — I739 Peripheral vascular disease, unspecified: Secondary | ICD-10-CM

## 2017-02-16 MED ORDER — JANUVIA 100 MG PO TABS: 100.0000 mg | ORAL_TABLET | Freq: Every day | ORAL | 6 refills | Status: DC

## 2017-02-16 NOTE — Telephone Encounter (Signed)
A refill request was received for Januvia. Rx was sent to pharmacy electronically.

## 2017-02-16 NOTE — Patient Instructions (Signed)
Medication Instructions:  Your physician recommends that you continue on your current medications as directed. Please refer to the Current Medication list given to you today.   Labwork: None ordered  Testing/Procedures: None ordered  Follow-Up: Your physician recommends that you schedule a follow-up appointment in: 03/07/17 WITH [name] NP.  Any Other Special Instructions Will Be Listed Below (If Applicable).     If you need a refill on your cardiac medications before your next appointment, please call your pharmacy.

## 2017-02-21 ENCOUNTER — Encounter: Payer: Self-pay | Admitting: Nurse Practitioner

## 2017-02-21 ENCOUNTER — Ambulatory Visit (INDEPENDENT_AMBULATORY_CARE_PROVIDER_SITE_OTHER): Payer: Medicare Other | Admitting: Nurse Practitioner

## 2017-02-21 VITALS — BP 143/86 | HR 63 | Temp 98.5°F | Resp 16 | Ht 60.0 in | Wt 123.0 lb

## 2017-02-21 DIAGNOSIS — I872 Venous insufficiency (chronic) (peripheral): Secondary | ICD-10-CM | POA: Diagnosis not present

## 2017-02-21 DIAGNOSIS — R609 Edema, unspecified: Secondary | ICD-10-CM

## 2017-02-21 NOTE — Progress Notes (Signed)
Careteam: Patient Care Team: Kirt Boysarter, Monica, DO as PCP - General (Internal Medicine) Rosalio MacadamiaGerhardt, Lori C, NP as Nurse Practitioner (Cardiology) Sherrie GeorgeMatthews, John D, MD as Consulting Physician (Ophthalmology) Kermit Baloeed, Tiffany L, DO as Consulting Physician (Geriatric Medicine) Sherren KernsFields, [name], MD as Consulting Physician (Vascular Surgery) Bernette RedbirdBuccini, Robert, MD as Consulting Physician (Gastroenterology) Tonny Bollmanooper, Michael, MD as Consulting Physician (Cardiology) Karie SodaGross, Steven, MD as Consulting Physician (General Surgery)  Advanced Directive information Does Patient Have a Medical Advance Directive?: No  Allergies  Allergen Reactions  . Namzaric [Memantine Hcl-Donepezil Hcl] Nausea Only    confusion  . Percocet [Oxycodone-Acetaminophen] Other (See Comments)    loosy goosy  . Sulfa Antibiotics Other (See Comments)    Tongue swells  . Adhesive [Tape] Other (See Comments)    Tears skin.  Please use "paper" tape  . Metformin And Related Diarrhea    Abnormal Kidney Functions   . Tradjenta [Linagliptin]     Low back ache, resolved once medication stopped   . Penicillins Hives    Tolerates Ceftriaxone, Has patient had a PCN reaction causing immediate rash, facial/tongue/throat swelling, SOB or lightheadedness with hypotension: Unknown Has patient had a PCN reaction causing severe rash involving mucus membranes or skin necrosis: No Has patient had a PCN reaction that required hospitalization No Has patient had a PCN reaction occurring within the last 10 years: No If all of the above answers are "NO", then may proceed with Cephalosporin use.     Chief Complaint  Patient presents with  . Acute Visit    Pt is being seen due to swelling in both feet. Pt noticed swelling a few days ago. Pt states right foot is painful.      HPI: Patient is a 83 y.o. female seen in the office today due to swelling in her feet.  States she is taking medication correctly Does not eat much because she is not  hungry.  States legs have been swollen.  Prop her feet up at home but not above the level of her heart Sleeps in the chair.  Denies eating foods high in sodium, does not use salt. Does not use compression socks but has some  No shortness of breath or chest pains.     Review of Systems:  Review of Systems  Constitutional: Negative for chills and fever.  Respiratory: Negative for cough, sputum production and shortness of breath.   Cardiovascular: Positive for leg swelling. Negative for chest pain and orthopnea.  Neurological: Negative for weakness.    Past Medical History:  Diagnosis Date  . Anemia   . Anxiety disorder   . Carotid artery occlusion   . Carotid bruit   . Chronic kidney disease (CKD), stage III (moderate) (HCC)   . Coronary artery disease    Remote PCI. Had BMS to RCA and DES stent to Diagonal in Jan. 2012   . Diastolic heart failure    EF 55 to 60% per cath in Jan 2012  . DVT (deep venous thrombosis) (HCC)   . Gout   . Hyperlipidemia   . Hypertension   . Ischemic cardiomyopathy    with prior EF of 35%  . NSTEMI (non-ST elevated myocardial infarction) [facility](HCC) Jan 2012   with PCI to RCA and DX per Dr. Excell Seltzerooper  . Obesity   . Pneumonia 2016  . Proteinuria   . Stroke [facility](HCC) 2013   ?  mini    . TIA (transient ischemic attack)   . Type II diabetes mellitus (HCC)  long standing   Past Surgical History:  Procedure Laterality Date  . ABDOMINAL AORTAGRAM N/A 01/17/2014   Procedure: ABDOMINAL Ronny FlurryAORTAGRAM;  Surgeon: Sherren Kernsharles E Fields, MD;  [facility];  Service: Cardiovascular;  Laterality: N/A;  . ANKLE FUSION Bilateral   . APPENDECTOMY    . CATARACT EXTRACTION, BILATERAL Bilateral   . CORONARY ANGIOPLASTY WITH STENT PLACEMENT  04/05/2010   distal RCA  . FRACTURE SURGERY Right 2005   bilateral ankles   . TONSILLECTOMY     Social History:   reports that she has quit smoking. she has never used smokeless tobacco. She reports that she does not drink  alcohol or use drugs.  Family History  Problem Relation Age of Onset  . Diabetes Daughter   . CAD Neg Hx     Medications:   Medication List        Accurate as of 02/21/17 10:19 AM. Always use your most recent med list.          aspirin EC 81 MG tablet Take 1 tablet (81 mg total) by mouth daily.   BYSTOLIC 20 MG Tabs Generic drug:  Nebivolol HCl TAKE 1 TABLET (20 MG TOTAL) BY MOUTH DAILY. FOR BLOOD PRESSURE   cloNIDine 0.1 MG tablet Commonly known as:  CATAPRES Take 1 tablet (0.1 mg total) by mouth 2 (two) times daily.   docusate sodium 100 MG capsule Commonly known as:  COLACE Take 1 capsule (100 mg total) by mouth every 12 (twelve) hours.   ezetimibe-simvastatin 10-20 MG tablet Commonly known as:  VYTORIN TAKE 1/2 TABLET BY MOUTH ONCE NIGHTLY TO CONTROL CHOLESTEROL   feeding supplement (ENSURE ENLIVE) Liqd Take 237 mLs by mouth 2 (two) times daily between meals.   furosemide 20 MG tablet Commonly known as:  LASIX TAKE 1 TABLET BY MOUTH TWICE DAILY FOR HYPERTENSION   JANUVIA 100 MG tablet Generic drug:  sitaGLIPtin Take 1 tablet (100 mg total) by mouth daily.   Loteprednol Etabonate 0.5 % Gel Commonly known as:  LOTEMAX Place 1 drop into the left eye daily. PLACE 1 DROP IN THE LEFT EYE TWICE DAILY   nitroGLYCERIN 0.4 MG SL tablet Commonly known as:  NITROSTAT Place one under tongue for chest pain, up to 3 tablets   polyethylene glycol powder powder Commonly known as:  GLYCOLAX/MIRALAX   * PROCTOZONE-HC 2.5 % rectal cream Generic drug:  hydrocortisone   * hydrocortisone 2.5 % rectal cream Commonly known as:  ANUSOL-HC Place 1 application rectally 3 (three) times daily.   timolol 0.5 % ophthalmic solution Commonly known as:  BETIMOL INSTILL 1 DROP INTO THE LEFT EYE DAILY      * This list has 2 medication(s) that are the same as other medications prescribed for you. Read the directions carefully, and ask your doctor or other care provider to review  them with you.           Physical Exam:  Vitals:   02/21/17 1013  BP: (!) 143/86  Pulse: 63  Resp: 16  Temp: 98.5 F (36.9 C)  TempSrc: Oral  SpO2: 95%  Weight: 123 lb (55.8 kg)  Height: 5' (1.524 m)   Body mass index is 24.02 kg/m.  Physical Exam  Constitutional: She appears well-developed and well-nourished.  Frail appearing in NAD  HENT:  Mouth/Throat: Oropharynx is clear and moist. No oropharyngeal exudate.  Eyes: Pupils are equal, round, and reactive to light. No scleral icterus.  Neck: Neck supple. Carotid bruit is not present. No tracheal  deviation present. No thyromegaly present.  Cardiovascular: Normal rate, regular rhythm and intact distal pulses. Exam reveals no friction rub.  Murmur (1/6 SEM-->carotid b/l) heard. Pulmonary/Chest: Effort normal and breath sounds normal. No stridor. No respiratory distress. She has no wheezes. She has no rales.  Abdominal: Normal appearance. There is no hepatomegaly. There is no rigidity.  Musculoskeletal: She exhibits edema (2+ to right 1+ to left) and deformity (kyphosis).  Lymphadenopathy:    She has no cervical adenopathy.  Neurological: She is alert.  Skin: Skin is warm and dry. No rash noted.  Psychiatric: She has a normal mood and affect. Her behavior is normal.   Labs reviewed: Basic Metabolic Panel: Recent Labs    09/08/16 1523  12/10/16 0425  02/05/17 0155 02/05/17 0749  02/08/17 0520 02/08/17 1437 02/09/17 0811 02/14/17 1600  NA  --    < > 144   < >  --  142   < > 136  --  139 142  K  --    < > 3.3*   < >  --  3.5   < > 3.8  --  4.3 4.8  CL  --    < > 114*   < >  --  109   < > 103  --  105 109  CO2  --    < > 21*   < >  --  22   < > 24  --  26 26  GLUCOSE  --    < > 105*   < >  --  103*   < > 225*  --  127* 113*  BUN  --    < > 22*   < >  --  28*   < > 38*  --  38* 43*  CREATININE  --    < > 2.01*   < >  --  1.94*   < > 2.36* 2.31* 1.97* 1.96*  CALCIUM  --    < > 8.1*   < >  --  8.4*   < > 8.0*  --   8.4* 8.6  MG  --   --  1.7  --   --  1.9  --   --   --   --   --   TSH 0.78  --   --   --  2.485  --   --   --   --   --   --    < > = values in this interval not displayed.   Liver Function Tests: Recent Labs    12/09/16 0056 12/21/16 1518 02/01/17 2146 02/04/17 1950  AST 20 16 15  13*  ALT 10* 6 9* 7*  ALKPHOS 52  --  59 59  BILITOT 0.4 0.5 0.5 0.5  PROT 6.1* 6.0* 6.1* 6.3*  ALBUMIN 3.4*  --  3.3* 3.6   Recent Labs    04/29/16 1407 10/01/16 1523 11/26/16 1725  LIPASE 33 21 18   No results for input(s): AMMONIA in the last 8760 hours. CBC: Recent Labs    12/09/16 0056  12/21/16 1518  02/05/17 0749 02/08/17 0520 02/14/17 1600  WBC 4.3   < > 4.6   < > 7.6 4.1 6.3  NEUTROABS 2.4  --  2,709  --  5.8  --   --   HGB 11.0*   < > 10.4*   < > 10.8* 9.5* 10.8*  HCT 34.4*   < > 32.6*   < >  33.6* 29.4* 33.6*  MCV 93.2   < > 91.8   < > 93.1 92.2 92.6  PLT 218   < > 206   < > 166 142* 242   < > = values in this interval not displayed.   Lipid Panel: Recent Labs    04/20/16 1351 08/29/16 1051  CHOL 148 130  HDL 42* 39*  LDLCALC 71 52  TRIG 173* 196*  CHOLHDL 3.5 3.3   TSH: Recent Labs    09/08/16 1523 02/05/17 0155  TSH 0.78 2.485   A1C: Lab Results  Component Value Date   HGBA1C 6.2 (H) 02/05/2017     Assessment/Plan 1. Edema of both lower legs due to peripheral venous insufficiency  Need to sleep in the bed with feet elevated above the level of the heart- use pillows for this  Would also recommend having your feet elevated about the level of your heart during the day if possible   To wear compression hose during the day- off at night  To increase your protein- need to eat 3 meals a day  Low sodium diet- no added salt.   To cont lasix 20 mg BID   Fernando Stoiber K. Biagio Borg  [facility] [phone] 8 am - 5 pm) [phone] (after hours)

## 2017-02-21 NOTE — Patient Instructions (Addendum)
   Need to sleep in the bed with feet elevated above the level of the heart- use pillows for this  Would also recommend having your feet elevated about the level of your heart during the day if possible   To wear compression hose during the day- off at night  To increase your protein- need to eat 3 meals a day.  Low sodium diet- no added salt.

## 2017-03-07 ENCOUNTER — Ambulatory Visit (INDEPENDENT_AMBULATORY_CARE_PROVIDER_SITE_OTHER): Payer: Medicare Other | Admitting: Nurse Practitioner

## 2017-03-07 ENCOUNTER — Encounter: Payer: Self-pay | Admitting: Nurse Practitioner

## 2017-03-07 VITALS — BP 144/84 | HR 75 | Ht 60.0 in | Wt 123.4 lb

## 2017-03-07 DIAGNOSIS — I5032 Chronic diastolic (congestive) heart failure: Secondary | ICD-10-CM

## 2017-03-07 DIAGNOSIS — I2510 Atherosclerotic heart disease of native coronary artery without angina pectoris: Secondary | ICD-10-CM

## 2017-03-07 DIAGNOSIS — K623 Rectal prolapse: Secondary | ICD-10-CM | POA: Diagnosis not present

## 2017-03-07 DIAGNOSIS — I10 Essential (primary) hypertension: Secondary | ICD-10-CM

## 2017-03-07 NOTE — Progress Notes (Signed)
CARDIOLOGY OFFICE NOTE  Date:  03/07/2017    [name] Date of Birth: Sep 11, 1927 Medical Record #[id number]  PCP:  Ashley Boys, DO  Cardiologist:  [name]   Chief Complaint  Patient presents with  . Coronary Artery Disease    Follow up visit - seen for [name]    History of Present Illness: [name] is a 83 y.o. female who presents today for a follow up visit. Former patient of Dr. Ronnald Walters. Primarily sees me.   She has multiple issues which include CAD, remote PCI, BMS to the RCA and DES to the DX in January of 2012. She has had longstanding diabetes, HTN, HLD, diastolic heart failure with EF 55 to 60% per cath back in 2012 but has been as low as 35% (2000), PVD (followed by Dr. Darrick Walters) with bilateral common iliac stenting in October of2015, gout, CKD and chronic anemia. She has LBBB.   I have known [name] for over 20 years - there is always an issue with her social situation, her medicines or her doctors in some shape or fashion that she is not happy about.   She has gotten more confused over the past year - family very frustrated - they have considered having her declared incompetent but did not follow thru.   Last seen by me back in August - lots of rectal pain. Cardiac status felt to be ok.   She was admitted 11/17 w/ rectal pain and constipation. She also had volume overload. She was hypoxic. Seen by cardiology and general surgery. Echo updated. Medicines were adjusted - deemed too high risk for any kind of surgery. Noted by Dr. Johna Walters that surgical intervention likely not even indicated at this time but he did recommend repeat evaluation with one of the colorectal surgeons to see what other interventions may be appropriate. I believe she has seen Dr. Michaell Walters in the past.   Seen by Ashley Boozer, NP towards the end of November - she was not taking her medicines as recommended - this is chronic.   Comes in today. Here in the room alone. [name] her  daughter brought her today - I did speak with her in the lobby - [name] notes more confusion, even combative at times and continued rectal pain. Admits that they are all aware that she is high risk for surgical intervention but that surgery may be the only course of action in order to provide pain relief.    [name] tells me that she is ok, other than the rectal pain - she is wanting me to check her prolapse. Tells me that I'm the only one that she can trust. She is rambling again today. Telling me the story of the man that lived with her for many years - ?stole from her - then left - now back - very hard to follow. No chest pain.  Breathing is "good". She manages her own medicines - unclear what she is taking. She says she is adamant about seeing someone who can tell her if anything can be done for her prolapse - "even if it kills me".   Past Medical History:  Diagnosis Date  . Anemia   . Anxiety disorder   . Carotid artery occlusion   . Carotid bruit   . Chronic kidney disease (CKD), stage III (moderate) (HCC)   . Coronary artery disease    Remote PCI. Had BMS to RCA and DES stent to Diagonal in Jan. 2012   .  Diastolic heart failure    EF 55 to 60% per cath in Jan 2012  . DVT (deep venous thrombosis) (HCC)   . Gout   . Hyperlipidemia   . Hypertension   . Ischemic cardiomyopathy    with prior EF of 35%  . NSTEMI (non-ST elevated myocardial infarction) [facility](HCC) Jan 2012   with PCI to RCA and DX per Dr. Excell Seltzerooper  . Obesity   . Pneumonia 2016  . Proteinuria   . Stroke [facility](HCC) 2013   ?  mini    . TIA (transient ischemic attack)   . Type II diabetes mellitus (HCC)    long standing    Past Surgical History:  Procedure Laterality Date  . ABDOMINAL AORTAGRAM N/A 01/17/2014   Procedure: ABDOMINAL Ronny FlurryAORTAGRAM;  Surgeon: Sherren Kernsharles E Fields, MD;  [facility];  Service: Cardiovascular;  Laterality: N/A;  . ANKLE FUSION Bilateral   . APPENDECTOMY    . CATARACT EXTRACTION, BILATERAL Bilateral     . CORONARY ANGIOPLASTY WITH STENT PLACEMENT  04/05/2010   distal RCA  . FRACTURE SURGERY Right 2005   bilateral ankles   . TONSILLECTOMY       Medications: Current Meds  Medication Sig  . aspirin EC 81 MG tablet Take 1 tablet (81 mg total) by mouth daily.  Marland Kitchen. BYSTOLIC 20 MG TABS TAKE 1 TABLET (20 MG TOTAL) BY MOUTH DAILY. FOR BLOOD PRESSURE  . cloNIDine (CATAPRES) 0.1 MG tablet Take 1 tablet (0.1 mg total) by mouth 2 (two) times daily.  Marland Kitchen. docusate sodium (COLACE) 100 MG capsule Take 1 capsule (100 mg total) by mouth every 12 (twelve) hours.  Marland Kitchen. ezetimibe-simvastatin (VYTORIN) 10-20 MG tablet TAKE 1/2 TABLET BY MOUTH ONCE NIGHTLY TO CONTROL CHOLESTEROL  . feeding supplement, ENSURE ENLIVE, (ENSURE ENLIVE) LIQD Take 237 mLs by mouth 2 (two) times daily between meals.  . furosemide (LASIX) 20 MG tablet TAKE 1 TABLET BY MOUTH TWICE DAILY FOR HYPERTENSION  . hydrocortisone (ANUSOL-HC) 2.5 % rectal cream Place 1 application rectally 3 (three) times daily.  Marland Kitchen. JANUVIA 100 MG tablet Take 1 tablet (100 mg total) by mouth daily.  . Loteprednol Etabonate (LOTEMAX) 0.5 % GEL Place 1 drop into the left eye daily. PLACE 1 DROP IN THE LEFT EYE TWICE DAILY  . nitroGLYCERIN (NITROSTAT) 0.4 MG SL tablet Place one under tongue for chest pain, up to 3 tablets  . polyethylene glycol powder (GLYCOLAX/MIRALAX) powder Take 17 g by mouth daily as needed.   Marland Kitchen. PROCTOZONE-HC 2.5 % rectal cream Place 1 application 2 (two) times daily rectally.   . timolol (BETIMOL) 0.5 % ophthalmic solution INSTILL 1 DROP INTO THE LEFT EYE DAILY     Allergies: Allergies  Allergen Reactions  . Namzaric [Memantine Hcl-Donepezil Hcl] Nausea Only    confusion  . Percocet [Oxycodone-Acetaminophen] Other (See Comments)    loosy goosy  . Sulfa Antibiotics Other (See Comments)    Tongue swells  . Adhesive [Tape] Other (See Comments)    Tears skin.  Please use "paper" tape  . Metformin And Related Diarrhea    Abnormal Kidney  Functions   . Tradjenta [Linagliptin]     Low back ache, resolved once medication stopped   . Penicillins Hives    Tolerates Ceftriaxone, Has patient had a PCN reaction causing immediate rash, facial/tongue/throat swelling, SOB or lightheadedness with hypotension: Unknown Has patient had a PCN reaction causing severe rash involving mucus membranes or skin necrosis: No Has patient had a PCN reaction that required hospitalization  No Has patient had a PCN reaction occurring within the last 10 years: No If all of the above answers are "NO", then may proceed with Cephalosporin use.     Social History: The patient  reports that she has quit smoking. she has never used smokeless tobacco. She reports that she does not drink alcohol or use drugs.   Family History: The patient's family history includes Diabetes in her daughter.   Review of Systems: Please see the history of present illness.   Otherwise, the review of systems is positive for none.   All other systems are reviewed and negative.   Physical Exam: VS:  BP (!) 144/84   Pulse 75   Ht 5' (1.524 m)   Wt 123 lb 6.4 oz (56 kg)   BMI 24.10 kg/m  .  BMI Body mass index is 24.1 kg/m.  Wt Readings from Last 3 Encounters:  03/07/17 123 lb 6.4 oz (56 kg)  02/21/17 123 lb (55.8 kg)  02/16/17 123 lb 6.4 oz (56 kg)    General: Elderly. Chronically ill appearing but alert  and in no acute distress.   HEENT: Normal. No bottom teeth. Periorbital edema.  Neck: Supple, no JVD, carotid bruits, or masses noted.  Cardiac: Regular rate and rhythm.  No edema.  Respiratory:  Lungs are clear to auscultation bilaterally with normal work of breathing.  GI: Soft and nontender.  MS: No deformity or atrophy. Gait not tested. She is in a wheelchair.  Skin: Warm and dry. Color is normal.  Neuro:  Strength and sensation are intact and no gross focal deficits noted.  Psych: Alert, appropriate and with normal affect.   LABORATORY DATA:  EKG:  EKG is  not ordered today.  Lab Results  Component Value Date   WBC 6.3 02/14/2017   HGB 10.8 (L) 02/14/2017   HCT 33.6 (L) 02/14/2017   PLT 242 02/14/2017   GLUCOSE 113 (H) 02/14/2017   CHOL 130 08/29/2016   TRIG 196 (H) 08/29/2016   HDL 39 (L) 08/29/2016   LDLCALC 52 08/29/2016   ALT 7 (L) 02/04/2017   AST 13 (L) 02/04/2017   NA 142 02/14/2017   K 4.8 02/14/2017   CL 109 02/14/2017   CREATININE 1.96 (H) 02/14/2017   BUN 43 (H) 02/14/2017   CO2 26 02/14/2017   TSH 2.485 02/05/2017   INR 1.09 04/15/2010   HGBA1C 6.2 (H) 02/05/2017   MICROALBUR 47.5 08/24/2016     BNP (last 3 results) Recent Labs    02/04/17 1950  BNP 1,024.7*    ProBNP (last 3 results) No results for input(s): PROBNP in the last 8760 hours.   Other Studies Reviewed Today:  Echo 02/05/17  Study Conclusions  - Left ventricle: The cavity size was normal. Wall thickness was normal. Systolic function was mildly reduced. The estimated ejection fraction was in the range of 45% to 50%. Hypokinesis of the basal-midinferior and inferoseptal myocardium; consistent with ischemia in the distribution of the right coronary artery. Features are consistent with a pseudonormal left ventricular filling pattern, with concomitant abnormal relaxation and increased filling pressure (grade 2 diastolic dysfunction). - Mitral valve: Calcified annulus. There was mild regurgitation. - Left atrium: The atrium was severely dilated. - Pulmonary arteries: PA peak pressure: 31 mm Hg (S).  Assessment/Plan:  1. Rectal prolapse - I have called [facility] - spoke to staff their who have consulted with Dr. Michaell Walters who has seen her in the past - he is agreeable to her seeing Dr.  Maisie Fushomas as a second opinion - perhaps there is some non surgical option available - but this will be on the condition that a family member is with her in order for a plan of care to be explained/heard, etc.  At first when I told her she was adamant that no  one would be coming - says they want to "kill me and want my house. I am leaving my house to [employer]". She did finally agree to have one of children present. Appointment has been made with Dr. Maisie Fushomas - I am most appreciative. She would be at high risk for any surgical intervention - she has been told this and I have told her family this in the past as well. She continues to tell me that she "will take my chances".   2. Diastolic HF - Weight is stable. No ACE/ARB due to CKD. Would favor conservative management.   3. Dementia - clearly progressing - this is the most pressing issue and most concerning to me. She continues to dose her own medicines - not able to verify and quite resistant for any help. Very difficult situation.   4. HLD - on Vytoin  5. HTN - BP is ok on her current regimen.  Current medicines are reviewed with the patient today.  The patient does not have concerns regarding medicines other than what has been noted above.  The following changes have been made:  See above.  Labs/ tests ordered today include:   No orders of the defined types were placed in this encounter.    Disposition:   FU with me in about 4 months.   Patient is agreeable to this plan and will call if any problems develop in the interim.   SignedNorma Fredrickson: Amous Crewe, NP  03/07/2017 4:33 PM  [facility] [address] [place], [place]  [address] Phone: [phone] Fax: [phone]

## 2017-03-07 NOTE — Patient Instructions (Addendum)
We will be checking the following labs today - NONE   Medication Instructions:    Continue with your current medicines.     Testing/Procedures To Be Arranged:  N/A  Follow-Up:   See me in about 4 months  I am trying to get you a visit with [facility] to discuss options for your rectal prolapse. You HAVE TO HAVE A FAMILY MEMBER PRESENT FOR YOU AT THAT VISIT  Your visit with Dr. Maisie Fushomas Monday, January 7th at 4:30 pm - arrive by 4:15pm Address [address]    Other Special Instructions:   N/A    If you need a refill on your cardiac medications before your next appointment, please call your pharmacy.   Call the [facility] office at [phone] if you have any questions, problems or concerns.

## 2017-03-09 ENCOUNTER — Emergency Department (HOSPITAL_COMMUNITY): Payer: Medicare Other

## 2017-03-09 ENCOUNTER — Encounter (HOSPITAL_COMMUNITY): Payer: Self-pay

## 2017-03-09 ENCOUNTER — Inpatient Hospital Stay (HOSPITAL_COMMUNITY)
Admission: EM | Admit: 2017-03-09 | Discharge: 2017-03-13 | DRG: 291 | Disposition: A | Discharge status: Home / Self Care | Payer: Medicare Other | Attending: Nephrology | Admitting: Nephrology

## 2017-03-09 ENCOUNTER — Other Ambulatory Visit: Payer: Self-pay

## 2017-03-09 DIAGNOSIS — J9601 Acute respiratory failure with hypoxia: Secondary | ICD-10-CM | POA: Diagnosis present

## 2017-03-09 DIAGNOSIS — E785 Hyperlipidemia, unspecified: Secondary | ICD-10-CM | POA: Diagnosis present

## 2017-03-09 DIAGNOSIS — I5023 Acute on chronic systolic (congestive) heart failure: Secondary | ICD-10-CM | POA: Diagnosis present

## 2017-03-09 DIAGNOSIS — Z66 Do not resuscitate: Secondary | ICD-10-CM | POA: Diagnosis present

## 2017-03-09 DIAGNOSIS — Z880 Allergy status to penicillin: Secondary | ICD-10-CM

## 2017-03-09 DIAGNOSIS — I2510 Atherosclerotic heart disease of native coronary artery without angina pectoris: Secondary | ICD-10-CM | POA: Diagnosis present

## 2017-03-09 DIAGNOSIS — Z87891 Personal history of nicotine dependence: Secondary | ICD-10-CM

## 2017-03-09 DIAGNOSIS — I130 Hypertensive heart and chronic kidney disease with heart failure and stage 1 through stage 4 chronic kidney disease, or unspecified chronic kidney disease: Secondary | ICD-10-CM | POA: Diagnosis present

## 2017-03-09 DIAGNOSIS — N183 Chronic kidney disease, stage 3 unspecified: Secondary | ICD-10-CM

## 2017-03-09 DIAGNOSIS — R443 Hallucinations, unspecified: Secondary | ICD-10-CM | POA: Diagnosis present

## 2017-03-09 DIAGNOSIS — L89312 Pressure ulcer of right buttock, stage 2: Secondary | ICD-10-CM | POA: Diagnosis present

## 2017-03-09 DIAGNOSIS — I509 Heart failure, unspecified: Secondary | ICD-10-CM

## 2017-03-09 DIAGNOSIS — Z9842 Cataract extraction status, left eye: Secondary | ICD-10-CM

## 2017-03-09 DIAGNOSIS — I447 Left bundle-branch block, unspecified: Secondary | ICD-10-CM | POA: Diagnosis present

## 2017-03-09 DIAGNOSIS — F0390 Unspecified dementia without behavioral disturbance: Secondary | ICD-10-CM | POA: Diagnosis present

## 2017-03-09 DIAGNOSIS — Z9841 Cataract extraction status, right eye: Secondary | ICD-10-CM | POA: Diagnosis not present

## 2017-03-09 DIAGNOSIS — I5043 Acute on chronic combined systolic (congestive) and diastolic (congestive) heart failure: Secondary | ICD-10-CM | POA: Diagnosis present

## 2017-03-09 DIAGNOSIS — K623 Rectal prolapse: Secondary | ICD-10-CM | POA: Diagnosis not present

## 2017-03-09 DIAGNOSIS — Z955 Presence of coronary angioplasty implant and graft: Secondary | ICD-10-CM

## 2017-03-09 DIAGNOSIS — Z882 Allergy status to sulfonamides status: Secondary | ICD-10-CM

## 2017-03-09 DIAGNOSIS — D649 Anemia, unspecified: Secondary | ICD-10-CM | POA: Diagnosis present

## 2017-03-09 DIAGNOSIS — I6529 Occlusion and stenosis of unspecified carotid artery: Secondary | ICD-10-CM | POA: Diagnosis present

## 2017-03-09 DIAGNOSIS — Z79899 Other long term (current) drug therapy: Secondary | ICD-10-CM

## 2017-03-09 DIAGNOSIS — I255 Ischemic cardiomyopathy: Secondary | ICD-10-CM | POA: Diagnosis present

## 2017-03-09 DIAGNOSIS — I10 Essential (primary) hypertension: Secondary | ICD-10-CM | POA: Diagnosis not present

## 2017-03-09 DIAGNOSIS — E1122 Type 2 diabetes mellitus with diabetic chronic kidney disease: Secondary | ICD-10-CM | POA: Diagnosis present

## 2017-03-09 DIAGNOSIS — Z91048 Other nonmedicinal substance allergy status: Secondary | ICD-10-CM

## 2017-03-09 DIAGNOSIS — M109 Gout, unspecified: Secondary | ICD-10-CM | POA: Diagnosis present

## 2017-03-09 DIAGNOSIS — Z86718 Personal history of other venous thrombosis and embolism: Secondary | ICD-10-CM

## 2017-03-09 DIAGNOSIS — I252 Old myocardial infarction: Secondary | ICD-10-CM | POA: Diagnosis not present

## 2017-03-09 DIAGNOSIS — Z885 Allergy status to narcotic agent status: Secondary | ICD-10-CM

## 2017-03-09 DIAGNOSIS — E1165 Type 2 diabetes mellitus with hyperglycemia: Secondary | ICD-10-CM | POA: Diagnosis present

## 2017-03-09 DIAGNOSIS — Z888 Allergy status to other drugs, medicaments and biological substances status: Secondary | ICD-10-CM

## 2017-03-09 DIAGNOSIS — Z7189 Other specified counseling: Secondary | ICD-10-CM | POA: Diagnosis not present

## 2017-03-09 DIAGNOSIS — F419 Anxiety disorder, unspecified: Secondary | ICD-10-CM | POA: Diagnosis present

## 2017-03-09 DIAGNOSIS — Z515 Encounter for palliative care: Secondary | ICD-10-CM | POA: Diagnosis not present

## 2017-03-09 DIAGNOSIS — Z8673 Personal history of transient ischemic attack (TIA), and cerebral infarction without residual deficits: Secondary | ICD-10-CM | POA: Diagnosis not present

## 2017-03-09 DIAGNOSIS — Z7984 Long term (current) use of oral hypoglycemic drugs: Secondary | ICD-10-CM

## 2017-03-09 DIAGNOSIS — R252 Cramp and spasm: Secondary | ICD-10-CM | POA: Diagnosis present

## 2017-03-09 DIAGNOSIS — Z7982 Long term (current) use of aspirin: Secondary | ICD-10-CM

## 2017-03-09 DIAGNOSIS — Z833 Family history of diabetes mellitus: Secondary | ICD-10-CM

## 2017-03-09 LAB — CBC WITH DIFFERENTIAL/PLATELET
BASOS PCT: 0 %
Basophils Absolute: 0 10*3/uL (ref 0.0–0.1)
Eosinophils Absolute: 0 10*3/uL (ref 0.0–0.7)
Eosinophils Relative: 0 %
HEMATOCRIT: 31.5 % — AB (ref 36.0–46.0)
HEMOGLOBIN: 9.8 g/dL — AB (ref 12.0–15.0)
LYMPHS ABS: 0.6 10*3/uL — AB (ref 0.7–4.0)
LYMPHS PCT: 8 %
MCH: 29.1 pg (ref 26.0–34.0)
MCHC: 31.1 g/dL (ref 30.0–36.0)
MCV: 93.5 fL (ref 78.0–100.0)
MONO ABS: 0.4 10*3/uL (ref 0.1–1.0)
MONOS PCT: 5 %
NEUTROS ABS: 7 10*3/uL (ref 1.7–7.7)
NEUTROS PCT: 87 %
Platelets: 230 10*3/uL (ref 150–400)
RBC: 3.37 MIL/uL — ABNORMAL LOW (ref 3.87–5.11)
RDW: 15.3 % (ref 11.5–15.5)
WBC: 8.1 10*3/uL (ref 4.0–10.5)

## 2017-03-09 LAB — BASIC METABOLIC PANEL
ANION GAP: 7 (ref 5–15)
BUN: 33 mg/dL — ABNORMAL HIGH (ref 6–20)
CALCIUM: 8.6 mg/dL — AB (ref 8.9–10.3)
CHLORIDE: 113 mmol/L — AB (ref 101–111)
CO2: 22 mmol/L (ref 22–32)
Creatinine, Ser: 1.75 mg/dL — ABNORMAL HIGH (ref 0.44–1.00)
GFR calc Af Amer: 29 mL/min — ABNORMAL LOW (ref >60)
GFR calc non Af Amer: 25 mL/min — ABNORMAL LOW (ref >60)
GLUCOSE: 186 mg/dL — AB (ref 65–99)
Potassium: 4.4 mmol/L (ref 3.5–5.1)
Sodium: 142 mmol/L (ref 135–145)

## 2017-03-09 LAB — I-STAT TROPONIN, ED: Troponin i, poc: 0.04 ng/mL (ref 0.00–0.08)

## 2017-03-09 LAB — BRAIN NATRIURETIC PEPTIDE: B Natriuretic Peptide: 3635.9 pg/mL — ABNORMAL HIGH (ref 0.0–100.0)

## 2017-03-09 LAB — TROPONIN I: TROPONIN I: 0.05 ng/mL — AB (ref <0.03)

## 2017-03-09 MED ORDER — TIMOLOL MALEATE 0.5 % OP SOLN
1.0000 [drp] | Freq: Two times a day (BID) | OPHTHALMIC | Status: DC
  Administered 2017-03-09 – 2017-03-13 (×8): 1 [drp] via OPHTHALMIC
  Filled 2017-03-09: qty 5

## 2017-03-09 MED ORDER — FUROSEMIDE 10 MG/ML IJ SOLN
40.0000 mg | Freq: Once | INTRAMUSCULAR | Status: AC
  Administered 2017-03-09: 40 mg via INTRAVENOUS
  Filled 2017-03-09: qty 4

## 2017-03-09 MED ORDER — NEBIVOLOL HCL 10 MG PO TABS
20.0000 mg | ORAL_TABLET | Freq: Every day | ORAL | Status: DC
  Administered 2017-03-10 – 2017-03-13 (×4): 20 mg via ORAL
  Filled 2017-03-09 (×4): qty 2

## 2017-03-09 MED ORDER — TIMOLOL HEMIHYDRATE 0.5 % OP SOLN: 1.0000 [drp] | Freq: Two times a day (BID) | OPHTHALMIC | Status: DC

## 2017-03-09 MED ORDER — CLONIDINE HCL 0.1 MG PO TABS
0.1000 mg | ORAL_TABLET | Freq: Two times a day (BID) | ORAL | Status: DC
  Administered 2017-03-09 – 2017-03-13 (×8): 0.1 mg via ORAL
  Filled 2017-03-09 (×9): qty 1

## 2017-03-09 MED ORDER — SODIUM CHLORIDE 0.9% FLUSH: 3.0000 mL | INTRAVENOUS | Status: DC | PRN

## 2017-03-09 MED ORDER — ENOXAPARIN SODIUM 40 MG/0.4ML ~~LOC~~ SOLN
40.0000 mg | SUBCUTANEOUS | Status: DC
  Administered 2017-03-09 – 2017-03-11 (×3): 40 mg via SUBCUTANEOUS
  Filled 2017-03-09 (×5): qty 0.4

## 2017-03-09 MED ORDER — HYDROCORTISONE 2.5 % RE CREA
1.0000 "application " | TOPICAL_CREAM | Freq: Two times a day (BID) | RECTAL | Status: DC
  Administered 2017-03-09 – 2017-03-13 (×8): 1 via RECTAL
  Filled 2017-03-09: qty 28.35

## 2017-03-09 MED ORDER — POLYETHYLENE GLYCOL 3350 17 GM/SCOOP PO POWD: 17.0000 g | Freq: Every day | ORAL | Status: DC | PRN

## 2017-03-09 MED ORDER — ASPIRIN EC 81 MG PO TBEC
81.0000 mg | DELAYED_RELEASE_TABLET | Freq: Every day | ORAL | Status: DC
  Administered 2017-03-10 – 2017-03-13 (×4): 81 mg via ORAL
  Filled 2017-03-09 (×4): qty 1

## 2017-03-09 MED ORDER — DOCUSATE SODIUM 100 MG PO CAPS
100.0000 mg | ORAL_CAPSULE | Freq: Two times a day (BID) | ORAL | Status: DC
  Administered 2017-03-09 – 2017-03-13 (×8): 100 mg via ORAL
  Filled 2017-03-09 (×8): qty 1

## 2017-03-09 MED ORDER — NITROGLYCERIN 0.4 MG SL SUBL: 0.4000 mg | SUBLINGUAL_TABLET | SUBLINGUAL | Status: DC | PRN

## 2017-03-09 MED ORDER — SODIUM CHLORIDE 0.9% FLUSH
3.0000 mL | Freq: Two times a day (BID) | INTRAVENOUS | Status: DC
  Administered 2017-03-09 – 2017-03-13 (×4): 3 mL via INTRAVENOUS

## 2017-03-09 MED ORDER — ACETAMINOPHEN 325 MG PO TABS
650.0000 mg | ORAL_TABLET | ORAL | Status: DC | PRN
  Administered 2017-03-10: 650 mg via ORAL
  Filled 2017-03-09: qty 2

## 2017-03-09 MED ORDER — ONDANSETRON HCL 4 MG/2ML IJ SOLN: 4.0000 mg | Freq: Four times a day (QID) | INTRAMUSCULAR | Status: DC | PRN

## 2017-03-09 MED ORDER — NITROGLYCERIN IN D5W 200-5 MCG/ML-% IV SOLN
0.0000 ug/min | Freq: Once | INTRAVENOUS | Status: AC
  Administered 2017-03-09: 5 ug/min via INTRAVENOUS
  Filled 2017-03-09: qty 250

## 2017-03-09 MED ORDER — POLYETHYLENE GLYCOL 3350 17 G PO PACK
17.0000 g | PACK | Freq: Every day | ORAL | Status: DC | PRN
  Administered 2017-03-12: 17 g via ORAL
  Filled 2017-03-09: qty 1

## 2017-03-09 MED ORDER — FUROSEMIDE 10 MG/ML IJ SOLN
40.0000 mg | Freq: Two times a day (BID) | INTRAMUSCULAR | Status: DC
  Administered 2017-03-09 – 2017-03-11 (×4): 40 mg via INTRAVENOUS
  Filled 2017-03-09 (×4): qty 4

## 2017-03-09 MED ORDER — SODIUM CHLORIDE 0.9 % IV SOLN: 250.0000 mL | INTRAVENOUS | Status: DC | PRN

## 2017-03-09 NOTE — ED Notes (Signed)
RRT PRESENT TO RE EVALUATE PT

## 2017-03-09 NOTE — ED Notes (Signed)
ASSIGNED [id number] @ 17:00 CALL REPORT @ 17:20

## 2017-03-09 NOTE — ED Provider Notes (Signed)
[facility]-EMERGENCY DEPT Provider Note   CSN: [id number] Arrival date & time: 03/09/17  1035     History   Chief Complaint Chief Complaint  Patient presents with  . Shortness of Breath   Level 5 caveat severe dyspnea HPI [name] is a 83 y.o. female.  Complains of shortness of breath progressively worsening this morning.  No cough no fever.  No other associated symptoms.  She also complains of pain at her buttocks for several days.  She was discharged from here 02/09/2017 for exacerbation of CHF, acute on chronic.  Lasix was temporary due to worsening kidney function  HPI  Past Medical History:  Diagnosis Date  . Anemia   . Anxiety disorder   . Carotid artery occlusion   . Carotid bruit   . Chronic kidney disease (CKD), stage III (moderate) (HCC)   . Coronary artery disease    Remote PCI. Had BMS to RCA and DES stent to Diagonal in Jan. 2012   . Diastolic heart failure    EF 55 to 60% per cath in Jan 2012  . DVT (deep venous thrombosis) (HCC)   . Gout   . Hyperlipidemia   . Hypertension   . Ischemic cardiomyopathy    with prior EF of 35%  . NSTEMI (non-ST elevated myocardial infarction) [facility](HCC) Jan 2012   with PCI to RCA and DX per Dr. Excell Seltzerooper  . Obesity   . Pneumonia 2016  . Proteinuria   . Stroke [facility](HCC) 2013   ?  mini    . TIA (transient ischemic attack)   . Type II diabetes mellitus (HCC)    long standing    Patient Active Problem List   Diagnosis Date Noted  . Acute kidney injury superimposed on CKD (HCC)stage III 02/07/2017  . Acute on chronic diastolic CHF (congestive heart failure) (HCC) 02/05/2017  . Urinary tract infection without hematuria   . Hypoxia 02/04/2017  . Abnormal CXR 02/04/2017  . Accelerated hypertension 02/04/2017  . LBBB (left bundle branch block) 02/04/2017  . Diarrhea 12/09/2016  . Delusions (HCC) 09/28/2016  . Medication noncompliance due to cognitive impairment 09/28/2016  . Hemorrhoids 05/04/2016  .  Pain in joint, lower leg 01/20/2016  . Ptosis 11/04/2015  . Glaucoma 08/25/2015  . Dementia without behavioral disturbance 06/10/2015  . Expressive aphasia 05/26/2015  . Rectal prolapse 02/25/2015  . Chronic kidney disease, stage III (moderate) (HCC) 02/10/2014  . PAD (peripheral artery disease) (HCC) 01/17/2014  . Constipation 07/31/2013  . Atherosclerosis of native artery of extremity with intermittent claudication (HCC) 07/04/2013  . Anxiety disorder 02/21/2013  . Diabetes mellitus with nephropathy (HCC) 07/05/2012  . Occlusion and stenosis of carotid artery without mention of cerebral infarction 06/21/2012  . Coronary artery disease   . Chronic diastolic heart failure, NYHA class 1 (HCC)   . Hyperlipidemia   . Hypertension   . Carotid bruit   . Gout   . Obesity   . TIA (transient ischemic attack)     Past Surgical History:  Procedure Laterality Date  . ABDOMINAL AORTAGRAM N/A 01/17/2014   Procedure: ABDOMINAL Ronny FlurryAORTAGRAM;  Surgeon: Sherren Kernsharles E Fields, MD;  [facility];  Service: Cardiovascular;  Laterality: N/A;  . ANKLE FUSION Bilateral   . APPENDECTOMY    . CATARACT EXTRACTION, BILATERAL Bilateral   . CORONARY ANGIOPLASTY WITH STENT PLACEMENT  04/05/2010   distal RCA  . FRACTURE SURGERY Right 2005   bilateral ankles   . TONSILLECTOMY  OB History    No data available       Home Medications    Prior to Admission medications   Medication Sig Start Date End Date Taking? Authorizing Provider  aspirin EC 81 MG tablet Take 1 tablet (81 mg total) by mouth daily. 12/11/16   Albertine GratesXu, Fang, MD  BYSTOLIC 20 MG TABS TAKE 1 TABLET (20 MG TOTAL) BY MOUTH DAILY. FOR BLOOD PRESSURE 12/26/16   Sharon SellerEubanks, Jessica K, NP  cloNIDine (CATAPRES) 0.1 MG tablet Take 1 tablet (0.1 mg total) by mouth 2 (two) times daily. 02/09/17   Kathlen ModyAkula, Vijaya, MD  docusate sodium (COLACE) 100 MG capsule Take 1 capsule (100 mg total) by mouth every 12 (twelve) hours. 01/09/17   Ward, Layla MawKristen N, DO    ezetimibe-simvastatin (VYTORIN) 10-20 MG tablet TAKE 1/2 TABLET BY MOUTH ONCE NIGHTLY TO CONTROL CHOLESTEROL 01/19/17   Kirt Boysarter, Monica, DO  feeding supplement, ENSURE ENLIVE, (ENSURE ENLIVE) LIQD Take 237 mLs by mouth 2 (two) times daily between meals. 12/11/16   Albertine GratesXu, Fang, MD  furosemide (LASIX) 20 MG tablet TAKE 1 TABLET BY MOUTH TWICE DAILY FOR HYPERTENSION 01/16/17   Kirt Boysarter, Monica, DO  hydrocortisone (ANUSOL-HC) 2.5 % rectal cream Place 1 application rectally 3 (three) times daily. 02/09/17   Kathlen ModyAkula, Vijaya, MD  JANUVIA 100 MG tablet Take 1 tablet (100 mg total) by mouth daily. 02/16/17   Kirt Boysarter, Monica, DO  Loteprednol Etabonate (LOTEMAX) 0.5 % GEL Place 1 drop into the left eye daily. PLACE 1 DROP IN THE LEFT EYE TWICE DAILY 01/19/17   Kirt Boysarter, Monica, DO  nitroGLYCERIN (NITROSTAT) 0.4 MG SL tablet Place one under tongue for chest pain, up to 3 tablets 09/28/16   Kirt Boysarter, Monica, DO  polyethylene glycol powder (GLYCOLAX/MIRALAX) powder Take 17 g by mouth daily as needed.  01/16/17   [provider]  PROCTOZONE-HC 2.5 % rectal cream Place 1 application 2 (two) times daily rectally.  01/16/17   [provider]  timolol (BETIMOL) 0.5 % ophthalmic solution INSTILL 1 DROP INTO THE LEFT EYE DAILY 09/28/16   Kirt Boysarter, Monica, DO    Family History Family History  Problem Relation Age of Onset  . Diabetes Daughter   . CAD Neg Hx     Social History Social History   Tobacco Use  . Smoking status: Former Games developermoker  . Smokeless tobacco: Never Used  . Tobacco comment: "smoked in my teens; quit after 1 year or so"  Substance Use Topics  . Alcohol use: No    Alcohol/week: 0.0 oz  . Drug use: No     Allergies   Namzaric [memantine hcl-donepezil hcl]; Percocet [oxycodone-acetaminophen]; Sulfa antibiotics; Adhesive [tape]; Metformin and related; Tradjenta [linagliptin]; and Penicillins   Review of Systems Review of Systems  Unable to perform ROS: Other  Respiratory: Positive for  shortness of breath.   Cardiovascular: Positive for leg swelling.  Gastrointestinal: Positive for rectal pain.   Shortness of breath(severe)  Physical Exam Updated Vital Signs BP (!) 192/99 (BP Location: Left Arm)   Pulse 60   Temp 98.5 F (36.9 C) (Oral)   Resp 17   Ht 5\' 7"  (1.702 m)   Wt 131.1 kg (289 lb)   SpO2 96%   BMI 45.26 kg/m   Physical Exam  Constitutional:  Chronically and acutely ill-appearing  HENT:  Head: Normocephalic and atraumatic.  Eyes: Conjunctivae are normal. Pupils are equal, round, and reactive to light.  Neck: Neck supple. No tracheal deviation present. No thyromegaly present.  Cardiovascular: Normal rate,  regular rhythm and normal heart sounds.  No murmur heard. Pulmonary/Chest:  Appears dyspneic.  Diffuse Rales  Abdominal: Soft. Bowel sounds are normal. She exhibits no distension. There is no tenderness.  Musculoskeletal: Normal range of motion. She exhibits edema. She exhibits no tenderness.  2+ pretibial pitting edema bilaterally  Neurological: She is alert. Coordination normal.  Skin: Skin is warm and dry. No rash noted.  Dime-sized decubitus ulcer at right buttock stage II  Psychiatric: She has a normal mood and affect.  Nursing note and vitals reviewed.    ED Treatments / Results  Labs (all labs ordered are listed, but only abnormal results are displayed) Labs Reviewed  BASIC METABOLIC PANEL  CBC WITH DIFFERENTIAL/PLATELET  BRAIN NATRIURETIC PEPTIDE  I-STAT TROPONIN, ED    EKG  EKG Interpretation  Date/Time:  Thursday March 09 2017 10:47:26 EST Ventricular Rate:  79 PR Interval:    QRS Duration: 128 QT Interval:  403 QTC Calculation: 462 R Axis:   -49 Text Interpretation:  Sinus rhythm Left bundle branch block No significant change since last tracing Confirmed by Doug Sou [phone]) on 03/09/2017 11:04:33 AM      Results for orders placed or performed during the hospital encounter of 03/09/17  Basic metabolic  panel  Result Value Ref Range   Sodium 142 135 - 145 mmol/L   Potassium 4.4 3.5 - 5.1 mmol/L   Chloride 113 (H) 101 - 111 mmol/L   CO2 22 22 - 32 mmol/L   Glucose, Bld 186 (H) 65 - 99 mg/dL   BUN 33 (H) 6 - 20 mg/dL   Creatinine, Ser 6.04 (H) 0.44 - 1.00 mg/dL   Calcium 8.6 (L) 8.9 - 10.3 mg/dL   GFR calc non Af Amer 25 (L) >60 mL/min   GFR calc Af Amer 29 (L) >60 mL/min   Anion gap 7 5 - 15  CBC with Differential/Platelet  Result Value Ref Range   WBC 8.1 4.0 - 10.5 K/uL   RBC 3.37 (L) 3.87 - 5.11 MIL/uL   Hemoglobin 9.8 (L) 12.0 - 15.0 g/dL   HCT 54.0 (L) 98.1 - 19.1 %   MCV 93.5 78.0 - 100.0 fL   MCH 29.1 26.0 - 34.0 pg   MCHC 31.1 30.0 - 36.0 g/dL   RDW 47.8 29.5 - 62.1 %   Platelets 230 150 - 400 K/uL   Neutrophils Relative % 87 %   Neutro Abs 7.0 1.7 - 7.7 K/uL   Lymphocytes Relative 8 %   Lymphs Abs 0.6 (L) 0.7 - 4.0 K/uL   Monocytes Relative 5 %   Monocytes Absolute 0.4 0.1 - 1.0 K/uL   Eosinophils Relative 0 %   Eosinophils Absolute 0.0 0.0 - 0.7 K/uL   Basophils Relative 0 %   Basophils Absolute 0.0 0.0 - 0.1 K/uL   Dg Chest Port 1 View  Result Date: 03/09/2017 CLINICAL DATA:  Shortness of breath. History of congestive heart failure. EXAM: PORTABLE CHEST 1 VIEW COMPARISON:  Chest CT 02/05/2017 FINDINGS: Cardiomediastinal silhouette is enlarged. Calcific atherosclerotic disease of the aorta. There is no evidence of focal airspace consolidation or pneumothorax. Pulmonary edema with bilateral small to moderate pleural effusions. Osseous structures are without acute abnormality. Soft tissues are grossly normal. IMPRESSION: Findings of heart failure including enlarged cardiac silhouette, interstitial pulmonary edema and bilateral pleural effusions. Electronically Signed   By: Ted Mcalpine M.D.   On: 03/09/2017 12:58  Radiology No results found.  Procedures Procedures (including critical care time)  Medications Ordered  in ED Medications - No data to  display  Chest x-ray viewed by me Initial Impression / Assessment and Plan / ED Course  I have reviewed the triage vital signs and the nursing notes.  Pertinent labs & imaging results that were available during my care of the patient were reviewed by me and considered in my medical decision making (see chart for details).     Patient is DNR CODE STATUS  After discussion after discussion with patient's daughter.  Palliative care consult ordered.  2:15 PM patient breathing more comfortably after treatment with BiPAP, intravenous nitroglycerin drip and intravenous Lasix.  Of consulted hospitalist service will arrange for overnight stay.  Renal insufficiency is chronic.  Anemia is chronic Final Clinical Impressions(s) / ED Diagnoses  Diagnoses #1 acute on chronic congestive heart failure #2 chronic renal insufficiency #3 hyperglycemia #4 anemia CRITICAL CARE Performed by: Doug Sou Total critical care time: 40 minutes Critical care time was exclusive of separately billable procedures and treating other patients. Critical care was necessary to treat or prevent imminent or life-threatening deterioration. Critical care was time spent personally by me on the following activities: development of treatment plan with patient and/or surrogate as well as nursing, discussions with consultants, evaluation of patient's response to treatment, examination of patient, obtaining history from patient or surrogate, ordering and performing treatments and interventions, ordering and review of laboratory studies, ordering and review of radiographic studies, pulse oximetry and re-evaluation of patient's condition. Final diagnoses:  None    ED Discharge Orders    None       Doug Sou, MD 03/09/17 1432

## 2017-03-09 NOTE — H&P (Signed)
History and Physical    [name] ZOX:[id number] DOB: 1927/07/02 DOA: 03/09/2017  PCP: Alwyn RenMathews, Giordano Getman G, MD Patient coming from: home  I have personally briefly reviewed patient[facility]  Chief Complaint: cannot breathe  HPI: [name] is a 83 y.o. female with medical history significant of stable heart failure systolic, dementia, admitted with increasing shortness of breath.  Patient was discharged from here 02/09/2017 when she was admitted for acute on chronic CHF.  According to the daughter she was supposed to be on twice a day of Lasix she will not take the evening dose of Lasix at any cost.  However she would continue taking the morning dose.  She denied any chest pain fever chills cough at this time no nausea vomiting or diarrhea.  She has a history of chronic rectal prolapse which she is fixated with and wanted to have surgery done though she was told many times she is not a surgical candidate.  She does have hallucination.  Denies urinary incontinence frequency or dysuria.  ED Course: She was placed on BiPAP and was given Lasix and nebulizer treatments and nitroglycerine.  Review of Systems: As per HPI otherwise 10 point review of systems negative.    Past Medical History:  Diagnosis Date  . Anemia   . Anxiety disorder   . Carotid artery occlusion   . Carotid bruit   . Chronic kidney disease (CKD), stage III (moderate) (HCC)   . Coronary artery disease    Remote PCI. Had BMS to RCA and DES stent to Diagonal in Jan. 2012   . Diastolic heart failure    EF 55 to 60% per cath in Jan 2012  . DVT (deep venous thrombosis) (HCC)   . Gout   . Hyperlipidemia   . Hypertension   . Ischemic cardiomyopathy    with prior EF of 35%  . NSTEMI (non-ST elevated myocardial infarction) [facility](HCC) Jan 2012   with PCI to RCA and DX per Dr. Excell Seltzerooper  . Obesity   . Pneumonia 2016  . Proteinuria   . Stroke [facility](HCC) 2013   ?  mini    . TIA (transient ischemic  attack)   . Type II diabetes mellitus (HCC)    long standing    Past Surgical History:  Procedure Laterality Date  . ABDOMINAL AORTAGRAM N/A 01/17/2014   Procedure: ABDOMINAL Ronny FlurryAORTAGRAM;  Surgeon: Sherren Kernsharles E Fields, MD;  [facility];  Service: Cardiovascular;  Laterality: N/A;  . ANKLE FUSION Bilateral   . APPENDECTOMY    . CATARACT EXTRACTION, BILATERAL Bilateral   . CORONARY ANGIOPLASTY WITH STENT PLACEMENT  04/05/2010   distal RCA  . FRACTURE SURGERY Right 2005   bilateral ankles   . TONSILLECTOMY       reports that she has quit smoking. she has never used smokeless tobacco. She reports that she does not drink alcohol or use drugs.  Allergies  Allergen Reactions  . Namzaric [Memantine Hcl-Donepezil Hcl] Nausea Only    confusion  . Percocet [Oxycodone-Acetaminophen] Other (See Comments)    loosy goosy  . Sulfa Antibiotics Other (See Comments)    Tongue swells  . Adhesive [Tape] Other (See Comments)    Tears skin.  Please use "paper" tape  . Metformin And Related Diarrhea    Abnormal Kidney Functions   . Tradjenta [Linagliptin]     Low back ache, resolved once medication stopped   . Penicillins Hives    Tolerates Ceftriaxone, Has  patient had a PCN reaction causing immediate rash, facial/tongue/throat swelling, SOB or lightheadedness with hypotension: Unknown Has patient had a PCN reaction causing severe rash involving mucus membranes or skin necrosis: No Has patient had a PCN reaction that required hospitalization No Has patient had a PCN reaction occurring within the last 10 years: No If all of the above answers are "NO", then may proceed with Cephalosporin use.     Family History  Problem Relation Age of Onset  . Diabetes Daughter   . CAD Neg Hx      Prior to Admission medications   Medication Sig Start Date End Date Taking? Authorizing Provider  aspirin EC 81 MG tablet Take 1 tablet (81 mg total) by mouth daily. 12/11/16  Yes Albertine Grates, MD  BYSTOLIC 20  MG TABS TAKE 1 TABLET (20 MG TOTAL) BY MOUTH DAILY. FOR BLOOD PRESSURE 12/26/16  Yes Sharon Seller, NP  cloNIDine (CATAPRES) 0.1 MG tablet Take 1 tablet (0.1 mg total) by mouth 2 (two) times daily. 02/09/17  Yes Kathlen Mody, MD  docusate sodium (COLACE) 100 MG capsule Take 1 capsule (100 mg total) by mouth every 12 (twelve) hours. 01/09/17  Yes Ward, Layla Maw, DO  ezetimibe-simvastatin (VYTORIN) 10-20 MG tablet TAKE 1/2 TABLET BY MOUTH ONCE NIGHTLY TO CONTROL CHOLESTEROL 01/19/17  Yes Montez Morita, [place], DO  feeding supplement, ENSURE ENLIVE, (ENSURE ENLIVE) LIQD Take 237 mLs by mouth 2 (two) times daily between meals. 12/11/16  Yes Albertine Grates, MD  furosemide (LASIX) 20 MG tablet TAKE 1 TABLET BY MOUTH TWICE DAILY FOR HYPERTENSION 01/16/17  Yes Kirt Boys, DO  hydrocortisone (ANUSOL-HC) 2.5 % rectal cream Place 1 application rectally 3 (three) times daily. 02/09/17  Yes Kathlen Mody, MD  JANUVIA 100 MG tablet Take 1 tablet (100 mg total) by mouth daily. 02/16/17  Yes Montez Morita, Monica, DO  Loteprednol Etabonate (LOTEMAX) 0.5 % GEL Place 1 drop into the left eye daily. PLACE 1 DROP IN THE LEFT EYE TWICE DAILY 01/19/17  Yes Kirt Boys, DO  nitroGLYCERIN (NITROSTAT) 0.4 MG SL tablet Place one under tongue for chest pain, up to 3 tablets 09/28/16  Yes Montez Morita, Monica, DO  polyethylene glycol powder (GLYCOLAX/MIRALAX) powder Take 17 g by mouth daily as needed.  01/16/17  Yes [provider]  PROCTOZONE-HC 2.5 % rectal cream Place 1 application 2 (two) times daily rectally.  01/16/17  Yes [provider]  timolol (BETIMOL) 0.5 % ophthalmic solution INSTILL 1 DROP INTO THE LEFT EYE DAILY 09/28/16  Yes Kirt Boys, DO    Physical Exam: Vitals:   03/09/17 1400 03/09/17 1415 03/09/17 1430 03/09/17 1446  BP: (!) 176/91 (!) 209/68 (!) 192/78   Pulse: 70 63 65   Resp:  18 (!) 30   Temp:      TempSrc:      SpO2: 100% 98% 98% 96%  Weight:      Height:        Constitutional: NAD,  calm, comfortable Vitals:   03/09/17 1400 03/09/17 1415 03/09/17 1430 03/09/17 1446  BP: (!) 176/91 (!) 209/68 (!) 192/78   Pulse: 70 63 65   Resp:  18 (!) 30   Temp:      TempSrc:      SpO2: 100% 98% 98% 96%  Weight:      Height:       Eyes: PERRL, lids and conjunctivae normal ENMT: Mucous membranes are DRY. Posterior pharynx clear of any exudate or lesions.Normal dentition.  Neck: normal, supple, no masses,  no thyromegaly Respiratory: crackles bilaterally, no wheezing, no crackles. Normal respiratory effort. No accessory muscle use.  Cardiovascular: Regular rate and rhythm, no murmurs / rubs / gallops. No extremity edema. 2+ pedal pulses. No carotid bruits.  Abdomen: no tenderness, no masses palpated. No hepatosplenomegaly. Bowel sounds positive.  Musculoskeletal: no clubbing / cyanosis. No joint deformity upper and lower extremities. Good ROM, no contractures. Normal muscle tone.  Skin: no rashes, lesions, ulcers. No induration Neurologic: CN 2-12 grossly intact. Sensation intact, DTR normal. Strength 5/5 in all 4.  Psychiatric: Normal judgment and insight. Alert and oriented x 3. Normal mood.    Labs on Admission: I have personally reviewed following labs and imaging studies  CBC: Recent Labs  Lab 03/09/17 1100  WBC 8.1  NEUTROABS 7.0  HGB 9.8*  HCT 31.5*  MCV 93.5  PLT 230   Basic Metabolic Panel: Recent Labs  Lab 03/09/17 1100  NA 142  K 4.4  CL 113*  CO2 22  GLUCOSE 186*  BUN 33*  CREATININE 1.75*  CALCIUM 8.6*   GFR: Estimated Creatinine Clearance: 32 mL/min (A) (by C-G formula based on SCr of 1.75 mg/dL (H)). Liver Function Tests: No results for input(s): AST, ALT, ALKPHOS, BILITOT, PROT, ALBUMIN in the last 168 hours. No results for input(s): LIPASE, AMYLASE in the last 168 hours. No results for input(s): AMMONIA in the last 168 hours. Coagulation Profile: No results for input(s): INR, PROTIME in the last 168 hours. Cardiac Enzymes: No results for  input(s): CKTOTAL, CKMB, CKMBINDEX, TROPONINI in the last 168 hours. BNP (last 3 results) No results for input(s): PROBNP in the last 8760 hours. HbA1C: No results for input(s): HGBA1C in the last 72 hours. CBG: No results for input(s): GLUCAP in the last 168 hours. Lipid Profile: No results for input(s): CHOL, HDL, LDLCALC, TRIG, CHOLHDL, LDLDIRECT in the last 72 hours. Thyroid Function Tests: No results for input(s): TSH, T4TOTAL, FREET4, T3FREE, THYROIDAB in the last 72 hours. Anemia Panel: No results for input(s): VITAMINB12, FOLATE, FERRITIN, TIBC, IRON, RETICCTPCT in the last 72 hours. Urine analysis:    Component Value Date/Time   COLORURINE YELLOW 02/04/2017 1615   COLORURINE YELLOW 02/04/2017 1615   APPEARANCEUR (A) 02/04/2017 1615    QUESTIONABLE RESULTS, RECOMMEND RECOLLECT TO VERIFY   APPEARANCEUR CLEAR 02/04/2017 1615   APPEARANCEUR Clear 02/21/2013 1622   LABSPEC  02/04/2017 1615    QUESTIONABLE RESULTS, RECOMMEND RECOLLECT TO VERIFY   LABSPEC 1.008 02/04/2017 1615   PHURINE  02/04/2017 1615    QUESTIONABLE RESULTS, RECOMMEND RECOLLECT TO VERIFY   PHURINE 5.0 02/04/2017 1615   GLUCOSEU (A) 02/04/2017 1615    QUESTIONABLE RESULTS, RECOMMEND RECOLLECT TO VERIFY   GLUCOSEU NEGATIVE 02/04/2017 1615   HGBUR (A) 02/04/2017 1615    QUESTIONABLE RESULTS, RECOMMEND RECOLLECT TO VERIFY   HGBUR SMALL (A) 02/04/2017 1615   BILIRUBINUR (A) 02/04/2017 1615    QUESTIONABLE RESULTS, RECOMMEND RECOLLECT TO VERIFY   BILIRUBINUR NEGATIVE 02/04/2017 1615   BILIRUBINUR Negative 02/21/2013 1622   KETONESUR (A) 02/04/2017 1615    QUESTIONABLE RESULTS, RECOMMEND RECOLLECT TO VERIFY   KETONESUR NEGATIVE 02/04/2017 1615   PROTEINUR (A) 02/04/2017 1615    QUESTIONABLE RESULTS, RECOMMEND RECOLLECT TO VERIFY   PROTEINUR 100 (A) 02/04/2017 1615   UROBILINOGEN 0.2 12/23/2014 1513   NITRITE (A) 02/04/2017 1615    QUESTIONABLE RESULTS, RECOMMEND RECOLLECT TO VERIFY   NITRITE NEGATIVE  02/04/2017 1615   LEUKOCYTESUR (A) 02/04/2017 1615    QUESTIONABLE RESULTS, RECOMMEND RECOLLECT TO VERIFY  LEUKOCYTESUR MODERATE (A) 02/04/2017 1615   LEUKOCYTESUR Negative 02/21/2013 1622    Radiological Exams on Admission: Dg Chest Port 1 View  Result Date: 03/09/2017 CLINICAL DATA:  Shortness of breath. History of congestive heart failure. EXAM: PORTABLE CHEST 1 VIEW COMPARISON:  Chest CT 02/05/2017 FINDINGS: Cardiomediastinal silhouette is enlarged. Calcific atherosclerotic disease of the aorta. There is no evidence of focal airspace consolidation or pneumothorax. Pulmonary edema with bilateral small to moderate pleural effusions. Osseous structures are without acute abnormality. Soft tissues are grossly normal. IMPRESSION: Findings of heart failure including enlarged cardiac silhouette, interstitial pulmonary edema and bilateral pleural effusions. Electronically Signed   By: Ted Mcalpineobrinka  Dimitrova M.D.   On: 03/09/2017 12:58    EKG: Independently reviewed.   Assessment/Plan Active Problems:   CHF (congestive heart failure) (HCC)  Acute chf from non complaince. Acute on ckd 3 htn cmp 35 %  Plan-admit to tele.  Start Lasix 40 mg IV twice a day and taper the dose as needed hold off on ACE inhibitor at this time due to acute renal failure.  Palliative care has been consulted from emergency room.  Patient wants to be DNR.  Restart beta-blocker.     DVT prophylaxis:LOVENOX Code StatusDNR Family CommunicationDW DAUGHTER :Disposition Plan:TBD Consults called PALLIATIVE CARE CALLED FROM ED. :Admission status:  TELE   Alwyn RenElizabeth G Kathyrn Warmuth MD [facility]  If 7PM-7AM, please contact night-coverage [email or web address] Password [email or web address]  03/09/2017, 3:09 PM

## 2017-03-09 NOTE — ED Triage Notes (Addendum)
Pt is from home with CC of [facility]. Pt was seen by MD yesterday and pt was advised to have sx for rectal prolapse. Per EMS pt O2 sats were 89% on RA. Pt has hx CHF

## 2017-03-09 NOTE — Progress Notes (Addendum)
CRITICAL VALUE ALERT  Critical Value:  TROPONIN 0.05  Date & Time Notied:  03/09/17  Provider Notified: Toniann Failkakrakandy  Orders Received/Actions taken:

## 2017-03-09 NOTE — Progress Notes (Signed)
RT took BIPAP off of the Pt and placed the Pt on 3L [place].Marland Kitchen. Pt's vitals are stable. RT will continue to monitor

## 2017-03-09 NOTE — ED Notes (Signed)
Bed: [id number] Expected date:  Expected time:  Means of arrival:  Comments: EMS-rectal prolapse

## 2017-03-09 NOTE — Progress Notes (Signed)
Palliative Medicine consult noted. Due to high referral volume, there may be a delay seeing this patient. Please call the Palliative Medicine Team office at [phone] if recommendations are needed in the interim.  Thank you for inviting us to see this patient.  Margret ChanceMelanie G. Shaday Rayborn, RN, BSN, [facility] 03/09/2017 4:31 PM Office [phone]

## 2017-03-10 DIAGNOSIS — Z7189 Other specified counseling: Secondary | ICD-10-CM

## 2017-03-10 DIAGNOSIS — I509 Heart failure, unspecified: Secondary | ICD-10-CM

## 2017-03-10 DIAGNOSIS — I5023 Acute on chronic systolic (congestive) heart failure: Secondary | ICD-10-CM

## 2017-03-10 DIAGNOSIS — Z515 Encounter for palliative care: Secondary | ICD-10-CM

## 2017-03-10 LAB — BASIC METABOLIC PANEL
Anion gap: 5 (ref 5–15)
BUN: 33 mg/dL — ABNORMAL HIGH (ref 6–20)
CALCIUM: 8.1 mg/dL — AB (ref 8.9–10.3)
CO2: 25 mmol/L (ref 22–32)
CREATININE: 1.76 mg/dL — AB (ref 0.44–1.00)
Chloride: 111 mmol/L (ref 101–111)
GFR calc Af Amer: 29 mL/min — ABNORMAL LOW (ref >60)
GFR calc non Af Amer: 25 mL/min — ABNORMAL LOW (ref >60)
GLUCOSE: 146 mg/dL — AB (ref 65–99)
Potassium: 4 mmol/L (ref 3.5–5.1)
Sodium: 141 mmol/L (ref 135–145)

## 2017-03-10 LAB — TROPONIN I
TROPONIN I: 0.05 ng/mL — AB (ref <0.03)
Troponin I: 0.04 ng/mL (ref <0.03)

## 2017-03-10 LAB — GLUCOSE, CAPILLARY
GLUCOSE-CAPILLARY: 145 mg/dL — AB (ref 65–99)
GLUCOSE-CAPILLARY: 184 mg/dL — AB (ref 65–99)
Glucose-Capillary: 161 mg/dL — ABNORMAL HIGH (ref 65–99)

## 2017-03-10 MED ORDER — NITROGLYCERIN IN D5W 200-5 MCG/ML-% IV SOLN
0.0000 ug/min | INTRAVENOUS | Status: DC
  Administered 2017-03-10: 100 ug/min via INTRAVENOUS
  Filled 2017-03-10: qty 250

## 2017-03-10 MED ORDER — FENTANYL CITRATE (PF) 100 MCG/2ML IJ SOLN: 12.5000 ug | INTRAMUSCULAR | Status: DC | PRN

## 2017-03-10 MED ORDER — MAGNESIUM SULFATE 2 GM/50ML IV SOLN
2.0000 g | Freq: Once | INTRAVENOUS | Status: AC
  Administered 2017-03-10: 2 g via INTRAVENOUS
  Filled 2017-03-10: qty 50

## 2017-03-10 NOTE — Progress Notes (Signed)
Patient was very confuse last night. Getting out of bed in an attempt to go to the kitchen to kill the roaches herself, Redirected couple of times and told that she is in the hospital. Nitro drip conts, titrated once this shift. BP 130/50, HR 60 @ this time.

## 2017-03-10 NOTE — Progress Notes (Signed)
PROGRESS NOTE    [name]  [id number] DOB: July 05, 1927 DOA: 03/09/2017 PCP: [name] Brief Narrative:83 y.o. female with medical history significant of stable heart failure systolic, dementia, admitted with increasing shortness of breath.  Patient was discharged from here 02/09/2017 when she was admitted for acute on chronic CHF.  According to the daughter she was supposed to be on twice a day of Lasix she will not take the evening dose of Lasix at any cost.  However she would continue taking the morning dose.  She denied any chest pain fever chills cough at this time no nausea vomiting or diarrhea.  She has a history of chronic rectal prolapse which she is fixated with and wanted to have surgery done though she was told many times she is not a surgical candidate.  She does have hallucination.  Denies urinary incontinence frequency or dysuria.  ED Course: She was placed on BiPAP and was given Lasix and nebulizer treatments and nitroglycerine   Assessment & Plan:   Active Problems:   CHF (congestive heart failure) (HCC) acute on chronic diastolic chf ef 50 percent echo 11/18 due to non complaince.improved with lasix,nitro,beta blockers.dc nitro gtt.continue catapress and bystolic. Uncontrolled htn beter and stable  Pressure ulcer stage Dementia with hallucinations  Rectal prolapse.non surgical. Acute on chronic ckd stage 3  Plan would be to continue current cardiac meds,palliative care consult called from ed.she lives at home with daughter.  DVT prophylaxis: lovenox Code Status:dnr Family Communication: no family available today Disposition [facility] Consultants:  pallaitive Proceduresnone :Antimicrobials: none Subjective:feels better,denies sob,nausea,vomiting,diarrhea   Objective:resting in bed in nad Vitals:   03/10/17 0616 03/10/17 0830 03/10/17 1038 03/10/17 1100  BP: (!) 130/50 (!) 96/40 (!) 152/96   Pulse: 60 73 (!) 114   Resp: 17     Temp: 98.6 F  (37 C)     TempSrc: Oral     SpO2: 97%     Weight:    59.2 kg (130 lb 8.2 oz)  Height:        Intake/Output Summary (Last 24 hours) at 03/10/2017 1500 Last data filed at 03/10/2017 0818 Gross per 24 hour  Intake 437.5 ml  Output 900 ml  Net -462.5 ml   Filed Weights   03/09/17 1104 03/09/17 1154 03/10/17 1100  Weight: 59 kg (130 lb) 131.1 kg (289 lb) 59.2 kg (130 lb 8.2 oz)    Examination:  General exam: Appears calm and comfortable  Respiratory systemcrackles auscultation. Respiratory effort normal. Cardiovascular system: S1 & S2 heard, RRR. No JVD, murmurs, rubs, gallops or clicks. No pedal edema. Gastrointestinal system: Abdomen is nondistended, soft and nontender. No organomegaly or masses felt. Normal bowel sounds heard. Central nervous system: Alert and oriented. No focal neurological deficits. Extremities: Symmetric 5 x 5 power. Skin: No rashes, lesions or ulcers     Data Reviewed: I have personally reviewed following labs and imaging studies  CBC: Recent Labs  Lab 03/09/17 1100  WBC 8.1  NEUTROABS 7.0  HGB 9.8*  HCT 31.5*  MCV 93.5  PLT 230   Basic Metabolic Panel: Recent Labs  Lab 03/09/17 1100 03/10/17 0023  NA 142 141  K 4.4 4.0  CL 113* 111  CO2 22 25  GLUCOSE 186* 146*  BUN 33* 33*  CREATININE 1.75* 1.76*  CALCIUM 8.6* 8.1*   GFR: Estimated Creatinine Clearance: 21 mL/min (A) (by C-G formula based on SCr of 1.76 mg/dL (H)). Liver Function Tests: No results for input(s): AST, ALT,  ALKPHOS, BILITOT, PROT, ALBUMIN in the last 168 hours. No results for input(s): LIPASE, AMYLASE in the last 168 hours. No results for input(s): AMMONIA in the last 168 hours. Coagulation Profile: No results for input(s): INR, PROTIME in the last 168 hours. Cardiac Enzymes: Recent Labs  Lab 03/09/17 1820 03/10/17 0023 03/10/17 0729  TROPONINI 0.05* 0.05* 0.04*   BNP (last 3 results) No results for input(s): PROBNP in the last 8760 hours. HbA1C: No  results for input(s): HGBA1C in the last 72 hours. CBG: Recent Labs  Lab 03/10/17 1201  GLUCAP 161*   Lipid Profile: No results for input(s): CHOL, HDL, LDLCALC, TRIG, CHOLHDL, LDLDIRECT in the last 72 hours. Thyroid Function Tests: No results for input(s): TSH, T4TOTAL, FREET4, T3FREE, THYROIDAB in the last 72 hours. Anemia Panel: No results for input(s): VITAMINB12, FOLATE, FERRITIN, TIBC, IRON, RETICCTPCT in the last 72 hours. Sepsis Labs: No results for input(s): PROCALCITON, LATICACIDVEN in the last 168 hours.  No results found for this or any previous visit (from the past 240 hour(s)).       Radiology Studies: Dg Chest Port 1 View  Result Date: 03/09/2017 CLINICAL DATA:  Shortness of breath. History of congestive heart failure. EXAM: PORTABLE CHEST 1 VIEW COMPARISON:  Chest CT 02/05/2017 FINDINGS: Cardiomediastinal silhouette is enlarged. Calcific atherosclerotic disease of the aorta. There is no evidence of focal airspace consolidation or pneumothorax. Pulmonary edema with bilateral small to moderate pleural effusions. Osseous structures are without acute abnormality. Soft tissues are grossly normal. IMPRESSION: Findings of heart failure including enlarged cardiac silhouette, interstitial pulmonary edema and bilateral pleural effusions. Electronically Signed   By: Ted Mcalpineobrinka  Dimitrova M.D.   On: 03/09/2017 12:58        Scheduled Meds: . aspirin EC  81 mg Oral Daily  . cloNIDine  0.1 mg Oral BID  . docusate sodium  100 mg Oral Q12H  . enoxaparin (LOVENOX) injection  40 mg Subcutaneous Q24H  . furosemide  40 mg Intravenous BID  . hydrocortisone  1 application Rectal BID  . nebivolol  20 mg Oral Daily  . sodium chloride flush  3 mL Intravenous Q12H  . timolol  1 drop Both Eyes BID   Continuous Infusions: . sodium chloride       LOS: 1 day      [name] [facility]  If 7PM-7AM, please contact night-coverage [email or web address] Password  [email or web address] 03/10/2017, 3:00 PM

## 2017-03-10 NOTE — Progress Notes (Signed)
Pt had episode of wide complex tachycardia, 14 beats on monitor. Pt resting quietly, asymptomatic.  MD notified, will continue to monitor.

## 2017-03-10 NOTE — Consult Note (Addendum)
WOC Nurse wound consult note Reason for Consult: right buttock partial thickness and suspected DTI from shear and friction bilateral buttocks Wound type: MASD IAD, shear and friction pressure Pressure Injury POA: Yes Measurement: 2cm x 2cm x 0.1cm open wound Wound bed: 90% thin yellow loosely adhering slough with a 4cm x 4cm area dark purple suspected DTI, left medial buttock intact but deep purple area measuring 3cm x 3cm. Drainage (amount, consistency, odor) none Periwound: deep purple immediately surrounding and red area from IAD Dressing procedure/placement/frequency: I have provided nurses with orders for NS wet to dry dressing changes BID to wound bed with foam for protection of both buttocks. Other than while eating patient should have her head of bed no higher than 30 degrees. Also [employer] Barrier Cream after perineal cleanser BID. Patient has a foley but is still incontinent of stool. Do not see lab work to determine nutritional status but would assume it is not adequate. Supplements or nutritional consult recommended, please order if you agree. We will not follow, but will remain available to this patient, to nursing, and the medical and/or surgical teams.  Please re-consult if we need to assist further.   Barnett HatterMelinda Abriana Saltos, RN-C, WTA-C, [facility] Wound Treatment Associate Ostomy Care Associate

## 2017-03-10 NOTE — Progress Notes (Signed)
CRITICAL VALUE ALERT  Critical Value:  Troponin 0.05  Date & Time Notied:  03/10/17@0130   Provider Notified: yes  Orders Received/Actions taken: None

## 2017-03-11 LAB — COMPREHENSIVE METABOLIC PANEL
ALBUMIN: 2.6 g/dL — AB (ref 3.5–5.0)
ALK PHOS: 41 U/L (ref 38–126)
ALT: 7 U/L — AB (ref 14–54)
AST: 11 U/L — AB (ref 15–41)
Anion gap: 10 (ref 5–15)
BUN: 40 mg/dL — AB (ref 6–20)
CALCIUM: 8.2 mg/dL — AB (ref 8.9–10.3)
CO2: 23 mmol/L (ref 22–32)
CREATININE: 2.05 mg/dL — AB (ref 0.44–1.00)
Chloride: 107 mmol/L (ref 101–111)
GFR calc Af Amer: 24 mL/min — ABNORMAL LOW (ref >60)
GFR calc non Af Amer: 21 mL/min — ABNORMAL LOW (ref >60)
GLUCOSE: 124 mg/dL — AB (ref 65–99)
Potassium: 4.6 mmol/L (ref 3.5–5.1)
SODIUM: 140 mmol/L (ref 135–145)
Total Bilirubin: 0.5 mg/dL (ref 0.3–1.2)
Total Protein: 5 g/dL — ABNORMAL LOW (ref 6.5–8.1)

## 2017-03-11 LAB — GLUCOSE, CAPILLARY
GLUCOSE-CAPILLARY: 186 mg/dL — AB (ref 65–99)
Glucose-Capillary: 120 mg/dL — ABNORMAL HIGH (ref 65–99)
Glucose-Capillary: 178 mg/dL — ABNORMAL HIGH (ref 65–99)
Glucose-Capillary: 139 mg/dL — ABNORMAL HIGH (ref 65–99)

## 2017-03-11 LAB — PREALBUMIN: Prealbumin: 12.6 mg/dL — ABNORMAL LOW (ref 18–38)

## 2017-03-11 LAB — URINE CULTURE: Culture: NO GROWTH

## 2017-03-11 MED ORDER — FUROSEMIDE 40 MG PO TABS
40.0000 mg | ORAL_TABLET | Freq: Two times a day (BID) | ORAL | Status: DC
  Administered 2017-03-11 – 2017-03-13 (×4): 40 mg via ORAL
  Filled 2017-03-11 (×4): qty 1

## 2017-03-11 NOTE — Progress Notes (Signed)
PROGRESS NOTE    [name]  ZOX:[id number] DOB: 1927/10/02 DOA: 03/09/2017 PCP: Alwyn RenMathews, Nesta Kimple G, MDBrief Narrative: 83 y.o.femalewith medical history significant ofstable heart failure systolic, dementia, admitted with increasing shortness of breath. Patient was discharged from here 02/09/2017 when she was admitted for acute on chronic CHF. According to the daughter she was supposed to be on twice a day of Lasix she will not take the evening dose of Lasix at any cost. However she would continue taking the morning dose. She denied any chest pain fever chills cough at this time no nausea vomiting or diarrhea. She has a history of chronic rectal prolapse which she is fixated with and wanted to have surgery done though she was told many times she is not a surgical candidate. She does have hallucination. Denies urinary incontinence frequency or dysuria. ED Course:She was placed on BiPAP and was given Lasix and nebulizer treatmentsand nitroglycerine 03/11/2017 patient is resting in bed in no acute distress her roommate who is a man is also in the room sleeping .  Nursing staff reports that she had a very good night and she slept through the night.  She appears to be in deep sleep.  She is on 3 L of oxygen at this time.  Saturation is 98%.   Assessment & Plan:   Active Problems:   CHF (congestive heart failure) (HCC) 1] acute on chronic diastolic CHF exacerbation last echo in 01/2017 showed ejection fraction 50%.  Patient has history of dementia and she refuses her evening aches.  She was placed back on her home regimen IV Lasix twice a day.  Her symptoms improved with this treatment we will taper the oxygen to DC the oxygen.  Patient did not have oxygen at home.  This was discussed with the nursing staff.  She apparently had a few beats of wide-complex tachycardia.  She was given a dose of magnesium last night.  Her potassium is normal above 4.  She is also on Bystolic which is  continued. 2] hypertension patient was admitted with uncontrolled hypertension currently stable on beta-blocker Norvasc and Catapres patch. 3] pressure ulcer present since admission. 4] dementia with hallucination patient is back to baseline. 5] rectal prolapse chronic patient's family reports that she has an appointment to see a colorectal surgeon on January 7.  She has had seen many surgeons in the past and was deemed to be a nonsurgical candidate due to age and complex medical issues.  However she is fixated with the problem that it needs to be fixed.  Appreciate palliative care input.  Family waiting to see the surgeon on January 7 to further decide whether they should go with a hospice route.  Patient was made DO NOT RESUSCITATE at the time of admission which was so far we will.  This was discussed with the patient and the daughter together.  Patient has a Foley catheter at this time which we will try to DC today she did not have a catheter at home.  PT evaluation will be done.   DVT prophylaxis: Lovenox Code Status: DNR Family Communication: No family available today Disposition Plan Depending on what PT evaluation outcome is patient might needs skilled nursing care or home with home health as patient lives at home with her daughter and her friend.  Consultants: Palliative care  Procedures: None Antimicrobials: None Subjective: Patient sleeping when I questioned her she opened her eyes and went back to sleep.  Nursing staff reports that she had a good  night and she sat up in chair yesterday.   Objective: Resting in bed in no acute distress Vitals:   03/10/17 1100 03/10/17 1503 03/10/17 2108 03/11/17 0619  BP:  (!) 97/45 (!) 121/43 (!) 177/50  Pulse:  63 (!) 58 (!) 56  Resp:  18 16 16   Temp:  98.3 F (36.8 C) 97.7 F (36.5 C) 97.9 F (36.6 C)  TempSrc:  Oral Oral Oral  SpO2:  99% 100% 100%  Weight: 59.2 kg (130 lb 8.2 oz)     Height:        Intake/Output Summary (Last 24  hours) at 03/11/2017 1000 Last data filed at 03/11/2017 [id number] Gross per 24 hour  Intake 290 ml  Output 1301 ml  Net -1011 ml   Filed Weights   03/09/17 1104 03/09/17 1154 03/10/17 1100  Weight: 59 kg (130 lb) 131.1 kg (289 lb) 59.2 kg (130 lb 8.2 oz)    Examination:  General exam: Appears calm and comfortable  Respiratory system:decreased breath soundsto auscultation. Respiratory effort normal. Cardiovascular system: S1 & S2 heard, RRR. No JVD, murmurs, rubs, gallops or clicks. No pedal edema. Gastrointestinal system: Abdomen is nondistended, soft and nontender. No organomegaly or masses felt. Normal bowel sounds heard. Central nervous system: Alert and oriented. No focal neurological deficits. Extremities: Symmetric 5 x 5 power. Skin: No rashes, lesions or ulcers    Data Reviewed: I have personally reviewed following labs and imaging studies  CBC: Recent Labs  Lab 03/09/17 1100  WBC 8.1  NEUTROABS 7.0  HGB 9.8*  HCT 31.5*  MCV 93.5  PLT 230   Basic Metabolic Panel: Recent Labs  Lab 03/09/17 1100 03/10/17 0023  NA 142 141  K 4.4 4.0  CL 113* 111  CO2 22 25  GLUCOSE 186* 146*  BUN 33* 33*  CREATININE 1.75* 1.76*  CALCIUM 8.6* 8.1*   GFR: Estimated Creatinine Clearance: 21 mL/min (A) (by C-G formula based on SCr of 1.76 mg/dL (H)). Liver Function Tests: No results for input(s): AST, ALT, ALKPHOS, BILITOT, PROT, ALBUMIN in the last 168 hours. No results for input(s): LIPASE, AMYLASE in the last 168 hours. No results for input(s): AMMONIA in the last 168 hours. Coagulation Profile: No results for input(s): INR, PROTIME in the last 168 hours. Cardiac Enzymes: Recent Labs  Lab 03/09/17 1820 03/10/17 0023 03/10/17 0729  TROPONINI 0.05* 0.05* 0.04*   BNP (last 3 results) No results for input(s): PROBNP in the last 8760 hours. HbA1C: No results for input(s): HGBA1C in the last 72 hours. CBG: Recent Labs  Lab 03/10/17 1201 03/10/17 1740 03/10/17 2032  03/11/17 0729  GLUCAP 161* 145* 184* 120*   Lipid Profile: No results for input(s): CHOL, HDL, LDLCALC, TRIG, CHOLHDL, LDLDIRECT in the last 72 hours. Thyroid Function Tests: No results for input(s): TSH, T4TOTAL, FREET4, T3FREE, THYROIDAB in the last 72 hours. Anemia Panel: No results for input(s): VITAMINB12, FOLATE, FERRITIN, TIBC, IRON, RETICCTPCT in the last 72 hours. Sepsis Labs: No results for input(s): PROCALCITON, LATICACIDVEN in the last 168 hours.  Recent Results (from the past 240 hour(s))  Culture, Urine     Status: None   Collection Time: 03/10/17  8:25 AM  Result Value Ref Range Status   Specimen Description URINE, CATHETERIZED  Final   Special Requests NONE  Final   Culture   Final    NO GROWTH Performed at [facility], [address] [place], [place] [address]    Report Status 03/11/2017 FINAL  Final  Radiology Studies: Dg Chest Port 1 View  Result Date: 03/09/2017 CLINICAL DATA:  Shortness of breath. History of congestive heart failure. EXAM: PORTABLE CHEST 1 VIEW COMPARISON:  Chest CT 02/05/2017 FINDINGS: Cardiomediastinal silhouette is enlarged. Calcific atherosclerotic disease of the aorta. There is no evidence of focal airspace consolidation or pneumothorax. Pulmonary edema with bilateral small to moderate pleural effusions. Osseous structures are without acute abnormality. Soft tissues are grossly normal. IMPRESSION: Findings of heart failure including enlarged cardiac silhouette, interstitial pulmonary edema and bilateral pleural effusions. Electronically Signed   By: Ted Mcalpine M.D.   On: 03/09/2017 12:58        Scheduled Meds: . aspirin EC  81 mg Oral Daily  . cloNIDine  0.1 mg Oral BID  . docusate sodium  100 mg Oral Q12H  . enoxaparin (LOVENOX) injection  40 mg Subcutaneous Q24H  . furosemide  40 mg Intravenous BID  . hydrocortisone  1 application Rectal BID  . nebivolol  20 mg Oral Daily  . sodium chloride flush  3  mL Intravenous Q12H  . timolol  1 drop Both Eyes BID   Continuous Infusions: . sodium chloride       LOS: 2 days      Alwyn Ren, MD [facility]  If 7PM-7AM, please contact night-coverage [email or web address] Password [email or web address] 03/11/2017, 10:00 AM

## 2017-03-11 NOTE — Consult Note (Signed)
Consultation Note Date: 03/11/2017   Patient Name: [name]  DOB: 1928/01/15  MRN: [id number]  Age / Sex: 83 y.o., female  PCP: Alwyn Ren, MD Referring Physician: Alwyn Ren, MD  Reason for Consultation: Establishing goals of care  HPI/Patient Profile: 83 y.o. female  with past medical history of systolic heart failure, dementia, and rectal prolapse admitted on 03/09/2017 with increasing shortness of breath and found to have heart failure exacerbation.  Palliative consulted for goals of care  Clinical Assessment and Goals of Care: [name] was initially seen by Cindra Presume, RN from the palliative medicine team.  She discussed with patient's daughters.    I saw and examined [name] and then called and spoke with her daughter, [name].  While is on the floor, I heard [name] moaning from her room.  I saw and examined her and she was reporting that she was having cramps in her legs.  Staff reports this is been going on throughout the day and it seems to get better with rubbing the area and also with getting a teaspoon of mustard.  I stretched out her leg and staff worked to get her upright in chair where she was more comfortable.  I called and spoke with her daughter, [name].  [name] tells me that her mom does have episodes of cramping at home and it is usually improved by rubbing the area.  [name] and I then reviewed conversation they had with Geraldine Solar earlier today.  [name] has 1 daughter who lives with her in addition to another long-term friend who has lived with them for years.  [name] is very focused on a long-term chronic rectal prolapse that is been going on for years.  She is consistently been told that she is not a good surgical candidate.  Earlier this year in November she was admitted to the hospital and evaluated again by surgery.  She has a follow-up  appointment on January 7 with colorectal specialist in order to see if there are any procedures that may be of benefit to her.  SUMMARY OF RECOMMENDATIONS   -DNR/DNI -Patient and family are invested in plan to visit with colorectal surgeon to see if there is any option for better treatment of her chronic rectal prolapse.  This is a large factor in her poor quality of life per family's opinion.  Her daughter, [name], reports that her mother really wants to see if there are any options for this before they would make any other changes to long-term care plan.  They are clear that if the point comes where she dies a natural death at further aggressive interventions would not be her wish at that point in time. -Family would to continue with current scope of care with the hope that she is well enough to make it to appointment in early January to discuss chronic rectal prolapse with surgeon.  Not feel that they can make any decisions regarding long-term goals of care prior to having this conversation. -  Palliative will continue to follow peripherally.  Family to call if they would like to meet further this admission.  Please let us know if there are specific areas with which we can be of assistance in the care of [name].  Code Status/Advance Care Planning:  DNR   Symptom Management:   Pain: Her daughter reports that she does poorly with morphine and also tramadol.  I encouraged staff to continue to work with nonmedication options for her cramps.  I did place a dose for low-dose fentanyl for use as needed if pain becomes severe.  Palliative Prophylaxis:   Bowel Regimen and Frequent Pain Assessment  Psycho-social/Spiritual:   Desire for further Chaplaincy support:no  Additional Recommendations: Caregiving  Support/Resources  Prognosis:   Unable to determine  Discharge Planning: To Be Determined      Primary Diagnoses: Present on Admission: **None**   I have reviewed the medical  record, interviewed the patient and family, and examined the patient. The following aspects are pertinent.  Past Medical History:  Diagnosis Date  . Anemia   . Anxiety disorder   . Carotid artery occlusion   . Carotid bruit   . Chronic kidney disease (CKD), stage III (moderate) (HCC)   . Coronary artery disease    Remote PCI. Had BMS to RCA and DES stent to Diagonal in Jan. 2012   . Diastolic heart failure    EF 55 to 60% per cath in Jan 2012  . DVT (deep venous thrombosis) (HCC)   . Gout   . Hyperlipidemia   . Hypertension   . Ischemic cardiomyopathy    with prior EF of 35%  . NSTEMI (non-ST elevated myocardial infarction) [facility](HCC) Jan 2012   with PCI to RCA and DX per Dr. Excell Seltzerooper  . Obesity   . Pneumonia 2016  . Proteinuria   . Stroke [facility](HCC) 2013   ?  mini    . TIA (transient ischemic attack)   . Type II diabetes mellitus (HCC)    long standing   Social History   Socioeconomic History  . Marital status: Widowed    Spouse name: None  . Number of children: None  . Years of education: None  . Highest education level: None  Social Needs  . Financial resource strain: None  . Food insecurity - worry: None  . Food insecurity - inability: None  . Transportation needs - medical: None  . Transportation needs - non-medical: None  Occupational History  . Occupation: retired OR Engineer, civil (consulting)nurse  Tobacco Use  . Smoking status: Former Games developermoker  . Smokeless tobacco: Never Used  . Tobacco comment: "smoked in my teens; quit after 1 year or so"  Substance and Sexual Activity  . Alcohol use: No    Alcohol/week: 0.0 oz  . Drug use: No  . Sexual activity: No  Other Topics Concern  . None  Social History Narrative   Widow   Former smoker   Alcohol none   No Advance Directive   Family History  Problem Relation Age of Onset  . Diabetes Daughter   . CAD Neg Hx    Scheduled Meds: . aspirin EC  81 mg Oral Daily  . cloNIDine  0.1 mg Oral BID  . docusate sodium  100 mg Oral Q12H  .  enoxaparin (LOVENOX) injection  40 mg Subcutaneous Q24H  . furosemide  40 mg Intravenous BID  . hydrocortisone  1 application Rectal BID  . nebivolol  20 mg Oral Daily  . sodium chloride  flush  3 mL Intravenous Q12H  . timolol  1 drop Both Eyes BID   Continuous Infusions: . sodium chloride     PRN Meds:.sodium chloride, acetaminophen, fentaNYL (SUBLIMAZE) injection, nitroGLYCERIN, ondansetron (ZOFRAN) IV, polyethylene glycol, sodium chloride flush Medications Prior to Admission:  Prior to Admission medications   Medication Sig Start Date End Date Taking? Authorizing Provider  aspirin EC 81 MG tablet Take 1 tablet (81 mg total) by mouth daily. 12/11/16  Yes Albertine Grates, MD  BYSTOLIC 20 MG TABS TAKE 1 TABLET (20 MG TOTAL) BY MOUTH DAILY. FOR BLOOD PRESSURE 12/26/16  Yes Sharon Seller, NP  cloNIDine (CATAPRES) 0.1 MG tablet Take 1 tablet (0.1 mg total) by mouth 2 (two) times daily. 02/09/17  Yes Kathlen Mody, MD  docusate sodium (COLACE) 100 MG capsule Take 1 capsule (100 mg total) by mouth every 12 (twelve) hours. 01/09/17  Yes Ward, Layla Maw, DO  ezetimibe-simvastatin (VYTORIN) 10-20 MG tablet TAKE 1/2 TABLET BY MOUTH ONCE NIGHTLY TO CONTROL CHOLESTEROL 01/19/17  Yes Montez Morita, [place], DO  feeding supplement, ENSURE ENLIVE, (ENSURE ENLIVE) LIQD Take 237 mLs by mouth 2 (two) times daily between meals. 12/11/16  Yes Albertine Grates, MD  furosemide (LASIX) 20 MG tablet TAKE 1 TABLET BY MOUTH TWICE DAILY FOR HYPERTENSION 01/16/17  Yes Kirt Boys, DO  hydrocortisone (ANUSOL-HC) 2.5 % rectal cream Place 1 application rectally 3 (three) times daily. 02/09/17  Yes Kathlen Mody, MD  JANUVIA 100 MG tablet Take 1 tablet (100 mg total) by mouth daily. 02/16/17  Yes Montez Morita, Monica, DO  Loteprednol Etabonate (LOTEMAX) 0.5 % GEL Place 1 drop into the left eye daily. PLACE 1 DROP IN THE LEFT EYE TWICE DAILY 01/19/17  Yes Kirt Boys, DO  nitroGLYCERIN (NITROSTAT) 0.4 MG SL tablet Place one under tongue for chest  pain, up to 3 tablets 09/28/16  Yes Montez Morita, Monica, DO  polyethylene glycol powder (GLYCOLAX/MIRALAX) powder Take 17 g by mouth daily as needed.  01/16/17  Yes [provider]  PROCTOZONE-HC 2.5 % rectal cream Place 1 application 2 (two) times daily rectally.  01/16/17  Yes [provider]  timolol (BETIMOL) 0.5 % ophthalmic solution INSTILL 1 DROP INTO THE LEFT EYE DAILY 09/28/16  Yes Kirt Boys, DO   Allergies  Allergen Reactions  . Namzaric [Memantine Hcl-Donepezil Hcl] Nausea Only    confusion  . Percocet [Oxycodone-Acetaminophen] Other (See Comments)    loosy goosy  . Sulfa Antibiotics Other (See Comments)    Tongue swells  . Adhesive [Tape] Other (See Comments)    Tears skin.  Please use "paper" tape  . Metformin And Related Diarrhea    Abnormal Kidney Functions   . Tradjenta [Linagliptin]     Low back ache, resolved once medication stopped   . Penicillins Hives    Tolerates Ceftriaxone, Has patient had a PCN reaction causing immediate rash, facial/tongue/throat swelling, SOB or lightheadedness with hypotension: Unknown Has patient had a PCN reaction causing severe rash involving mucus membranes or skin necrosis: No Has patient had a PCN reaction that required hospitalization No Has patient had a PCN reaction occurring within the last 10 years: No If all of the above answers are "NO", then may proceed with Cephalosporin use.    Review of Systems Patient will and discuss pain in her left leg related to cramp in her calf.  Physical Exam  General: Alert, awake, in pain from leg cramps.  She is chronically ill-appearing.  Will not participate in further examination due to having a  cramp in her leg. Heart: Regular rate and rhythm. No murmur appreciated. Lungs: Good air movement, Crackles in bases Abdomen: Soft, nontender, nondistended, positive bowel sounds.  Ext: No significant edema Skin: Warm and dry Neuro: Grossly intact, nonfocal.   Vital Signs: BP  (!) 177/50 (BP Location: Left Arm)   Pulse (!) 56   Temp 97.9 F (36.6 C) (Oral)   Resp 16   Ht 5\' 7"  (1.702 m)   Wt 59.2 kg (130 lb 8.2 oz)   SpO2 100%   BMI 20.44 kg/m  Pain Assessment: No/denies pain   Pain Score: 0-No pain   SpO2: SpO2: 100 % O2 Device:SpO2: 100 % O2 Flow Rate: .O2 Flow Rate (L/min): 4 L/min  IO: Intake/output summary:   Intake/Output Summary (Last 24 hours) at 03/11/2017 0856 Last data filed at 03/11/2017 [id number] Gross per 24 hour  Intake 290 ml  Output 1301 ml  Net -1011 ml    LBM: Last BM Date: 03/09/17 Baseline Weight: Weight: 59 kg (130 lb) Most recent weight: Weight: 59.2 kg (130 lb 8.2 oz)     Palliative Assessment/Data:   Flowsheet Rows     Most Recent Value  Intake Tab  Referral Department  Hospitalist  Unit at Time of Referral  Med/Surg Unit  Palliative Care Primary Diagnosis  Cardiac  Date Notified  03/09/17  Palliative Care Type  New Palliative care  Reason for referral  Clarify Goals of Care  Date of Admission  03/09/17  Date first seen by Palliative Care  03/10/17  # of days Palliative referral response time  1 Day(s)  # of days IP prior to Palliative referral  0  Clinical Assessment  Palliative Performance Scale Score  50%  Pain Max last 24 hours  10  Pain Min Last 24 hours  0  Psychosocial & Spiritual Assessment  Palliative Care Outcomes  Patient/Family meeting held?  Yes  Who was at the meeting?  Daughter via phone      Time In: 1505 Time Out: 1555 Time Total: 50 Greater than 50%  of this time was spent counseling and coordinating care related to the above assessment and plan.  Signed by: Romie MinusGene Joshalyn Ancheta, MD   Please contact Palliative Medicine Team phone at [phone] for questions and concerns.  For individual provider: See Loretha StaplerAmion

## 2017-03-12 DIAGNOSIS — K623 Rectal prolapse: Secondary | ICD-10-CM

## 2017-03-12 DIAGNOSIS — N183 Chronic kidney disease, stage 3 (moderate): Secondary | ICD-10-CM

## 2017-03-12 LAB — CBC
HEMATOCRIT: 29.1 % — AB (ref 36.0–46.0)
Hemoglobin: 9.3 g/dL — ABNORMAL LOW (ref 12.0–15.0)
MCH: 29.2 pg (ref 26.0–34.0)
MCHC: 32 g/dL (ref 30.0–36.0)
MCV: 91.5 fL (ref 78.0–100.0)
Platelets: 211 10*3/uL (ref 150–400)
RBC: 3.18 MIL/uL — ABNORMAL LOW (ref 3.87–5.11)
RDW: 14.7 % (ref 11.5–15.5)
WBC: 4.8 10*3/uL (ref 4.0–10.5)

## 2017-03-12 LAB — BASIC METABOLIC PANEL
Anion gap: 6 (ref 5–15)
BUN: 51 mg/dL — AB (ref 6–20)
CALCIUM: 8.1 mg/dL — AB (ref 8.9–10.3)
CO2: 29 mmol/L (ref 22–32)
CREATININE: 1.99 mg/dL — AB (ref 0.44–1.00)
Chloride: 105 mmol/L (ref 101–111)
GFR calc Af Amer: 25 mL/min — ABNORMAL LOW (ref >60)
GFR, EST NON AFRICAN AMERICAN: 21 mL/min — AB (ref >60)
GLUCOSE: 152 mg/dL — AB (ref 65–99)
Potassium: 4.7 mmol/L (ref 3.5–5.1)
Sodium: 140 mmol/L (ref 135–145)

## 2017-03-12 LAB — GLUCOSE, CAPILLARY
GLUCOSE-CAPILLARY: 143 mg/dL — AB (ref 65–99)
Glucose-Capillary: 186 mg/dL — ABNORMAL HIGH (ref 65–99)
Glucose-Capillary: 147 mg/dL — ABNORMAL HIGH (ref 65–99)
Glucose-Capillary: 172 mg/dL — ABNORMAL HIGH (ref 65–99)

## 2017-03-12 LAB — MAGNESIUM: Magnesium: 2.3 mg/dL (ref 1.7–2.4)

## 2017-03-12 MED ORDER — POLYETHYLENE GLYCOL 3350 17 G PO PACK
17.0000 g | PACK | Freq: Every day | ORAL | Status: DC
  Administered 2017-03-13: 17 g via ORAL
  Filled 2017-03-12: qty 1

## 2017-03-12 MED ORDER — ENSURE ENLIVE PO LIQD
237.0000 mL | Freq: Two times a day (BID) | ORAL | Status: DC
  Administered 2017-03-12 – 2017-03-13 (×2): 237 mL via ORAL

## 2017-03-12 MED ORDER — ENOXAPARIN SODIUM 30 MG/0.3ML ~~LOC~~ SOLN
30.0000 mg | SUBCUTANEOUS | Status: DC
  Administered 2017-03-12: 30 mg via SUBCUTANEOUS
  Filled 2017-03-12: qty 0.3

## 2017-03-12 MED ORDER — ALPRAZOLAM 0.25 MG PO TABS
0.2500 mg | ORAL_TABLET | Freq: Once | ORAL | Status: AC
  Administered 2017-03-12: 0.25 mg via ORAL
  Filled 2017-03-12: qty 1

## 2017-03-12 NOTE — Progress Notes (Signed)
SATURATION QUALIFICATIONS: (This note is used to comply with regulatory documentation for home oxygen)  Patient Saturations on Room Air at Rest = 83%  Patient Saturations on Room Air while Ambulating = 83%  Patient Saturations on 2 Liters of oxygen while Ambulating = 97%  Please briefly explain why patient needs home oxygen: to maintain SaO2 greater than 90%.   Ralene BatheUhlenberg, Gavin Faivre Kistler PT 03/12/2017  [phone]

## 2017-03-12 NOTE — Progress Notes (Signed)
Initial Nutrition Assessment  INTERVENTION:   Provide Ensure Enlive po BID, each supplement provides 350 kcal and 20 grams of protein RD will continue to monitor  NUTRITION DIAGNOSIS:   Increased nutrient needs related to wound healing(chronic rectal prolapse) as evidenced by estimated needs.  GOAL:   Patient will meet greater than or equal to 90% of their needs  MONITOR:   PO intake, Supplement acceptance, Weight trends, Labs, I & O's, Skin  REASON FOR ASSESSMENT:   Consult Assessment of nutrition requirement/status  ASSESSMENT:   83 year old female with history of congestive heart failure, dementia, rectal prolapse, admitted with shortness of breath.  Patient was discharged from the hospital on 02/09/2017 when she was admitted for acute on chronic CHF.   Patient currently consumed 75-100% of meals. Per chart review, pt expected to discuss surgical options for rectal prolapse on 03/27/17. GOC will be decided following that consultation. Pt eating well but has still had some weight loss. Pt has lost 11 lb since 9/23 (8% wt loss x 3 months, significant for time frame).  Will order Ensure supplements given weight loss and stage 2 buttocks wound.  Medications: colace capsule every 12 hours, Lasix tablet BID Labs reviewed: CBGs: 143-186 GFR: 21 Mg WNL   NUTRITION - FOCUSED PHYSICAL EXAM:    Most Recent Value  Orbital Region  No depletion  Upper Arm Region  No depletion  Thoracic and Lumbar Region  Unable to assess  Buccal Region  No depletion  Temple Region  Mild depletion  Clavicle Bone Region  Mild depletion  Clavicle and Acromion Bone Region  No depletion  Scapular Bone Region  No depletion  Dorsal Hand  Mild depletion  Patellar Region  Unable to assess  Anterior Thigh Region  Unable to assess  Posterior Calf Region  Unable to assess  Edema (RD Assessment)  Mild       Diet Order:  Diet Heart Room service appropriate? Yes; Fluid consistency: Thin  EDUCATION  NEEDS:   No education needs have been identified at this time  Skin:  Skin Assessment: Skin Integrity Issues: Skin Integrity Issues:: Stage II Stage II: buttocks  Last BM:  12/23  Height:   Ht Readings from Last 1 Encounters:  03/09/17 5\' 7"  (1.702 m)    Weight:   Wt Readings from Last 1 Encounters:  03/12/17 119 lb 7.8 oz (54.2 kg)    Ideal Body Weight:  61.3 kg  BMI:  Body mass index is 18.71 kg/m.  Estimated Nutritional Needs:   Kcal:  1350-1550  Protein:  65-75g  Fluid:  1.5L/day  Tilda FrancoLindsey Terie Lear, MS, RD, LDN [facility] Clinical Dietitian Pager: [phone] After Hours Pager: [phone]

## 2017-03-12 NOTE — Progress Notes (Signed)
PROGRESS NOTE    Ashley RheinDoris E Balbi  ZOX:[id number] DOB: 06/06/1927 DOA: 03/09/2017 PCP: Alwyn RenMathews, Elizabeth G, MD   Brief Narrative: 83 year old female with history of congestive heart failure, dementia, rectal prolapse, admitted with shortness of breath.  Patient was discharged from the hospital on 02/09/2017 when she was admitted for acute on chronic CHF.  She has a history of chronic rectal prolapse which she is fixated with and wanted to have surgery done though she was told many times she is not a surgical candidate.  Assessment & Plan:   #Acute on chronic combined systolic and diastolic congestive heart failure: Continue Lasix, beta-blocker, aspirin.  Shortness of breath is better.  Currently on 2 L of oxygen.  Daily weight.  #Hypertension: Monitor blood pressure.  Continue current medication.  #Dementia with hallucination: Continue supportive care.  # Chronic rectal prolapse: As per patient and from prior notes it is chronic and patient has appointment with colorectal surgeon on January 7.  I recommended patient to keep outpatient follow-up.  Continue stool softener.  I have discussed with surgeon Dr.Wakefield, who recommended supportive care including stool softener and outpatient follow-up.  #Chronic kidney disease stage III: Serum creatinine level around baseline.  Monitor BMP.  Avoid for toxins.  #Acute respiratory failure with hypoxia: Patient failed ambulatory oxygen assessment.  Requiring 2 L of oxygen.  Patient is DNR.  Seen by palliative care.  DVT prophylaxis: Lovenox subcutaneous Code Status: DNR Family Communication: Patient's friend at the bedside Disposition Plan: Likely discharge home in 1-2 days    Consultants:   Palliative care  Procedures: None Antimicrobials: None  Subjective: Seen and examined at bedside.  Patient was concerned about rectal prolapse and discussing about her encounter with the surgeon in the past.  Denies shortness of breath, chest  pain, nausea or vomiting.  Objective: Vitals:   03/11/17 2035 03/12/17 0453 03/12/17 0500 03/12/17 0905  BP: (!) 179/51 (!) 181/57    Pulse: 60 61    Resp: 20 18    Temp: 98.3 F (36.8 C) 98.1 F (36.7 C)    TempSrc: Oral Oral    SpO2: 99% 92%  (!) 83%  Weight:   54.2 kg (119 lb 7.8 oz)   Height:       No intake or output data in the 24 hours ending 03/12/17 1343 Filed Weights   03/10/17 1100 03/11/17 1100 03/12/17 0500  Weight: 59.2 kg (130 lb 8.2 oz) 56.9 kg (125 lb 7.1 oz) 54.2 kg (119 lb 7.8 oz)    Examination:  General exam: Appears calm and comfortable  Respiratory system: Clear to auscultation. Respiratory effort normal. No wheezing or crackle Cardiovascular system: S1 & S2 heard, RRR.  No pedal edema. Gastrointestinal system: Abdomen is nondistended, soft and nontender. Normal bowel sounds heard. Central nervous system: Alert awake and following commands skin: No rashes, lesions or ulcers   Data Reviewed: I have personally reviewed following labs and imaging studies  CBC: Recent Labs  Lab 03/09/17 1100 03/12/17 0458  WBC 8.1 4.8  NEUTROABS 7.0  --   HGB 9.8* 9.3*  HCT 31.5* 29.1*  MCV 93.5 91.5  PLT 230 211   Basic Metabolic Panel: Recent Labs  Lab 03/09/17 1100 03/10/17 0023 03/11/17 0539 03/12/17 0458  NA 142 141 140 140  K 4.4 4.0 4.6 4.7  CL 113* 111 107 105  CO2 22 25 23 29   GLUCOSE 186* 146* 124* 152*  BUN 33* 33* 40* 51*  CREATININE 1.75* 1.76* 2.05* 1.99*  CALCIUM 8.6* 8.1* 8.2* 8.1*  MG  --   --   --  2.3   GFR: Estimated Creatinine Clearance: 17 mL/min (A) (by C-G formula based on SCr of 1.99 mg/dL (H)). Liver Function Tests: Recent Labs  Lab 03/11/17 0539  AST 11*  ALT 7*  ALKPHOS 41  BILITOT 0.5  PROT 5.0*  ALBUMIN 2.6*   No results for input(s): LIPASE, AMYLASE in the last 168 hours. No results for input(s): AMMONIA in the last 168 hours. Coagulation Profile: No results for input(s): INR, PROTIME in the last 168  hours. Cardiac Enzymes: Recent Labs  Lab 03/09/17 1820 03/10/17 0023 03/10/17 0729  TROPONINI 0.05* 0.05* 0.04*   BNP (last 3 results) No results for input(s): PROBNP in the last 8760 hours. HbA1C: No results for input(s): HGBA1C in the last 72 hours. CBG: Recent Labs  Lab 03/11/17 1207 03/11/17 1652 03/11/17 2032 03/12/17 0754 03/12/17 1158  GLUCAP 178* 139* 186* 143* 186*   Lipid Profile: No results for input(s): CHOL, HDL, LDLCALC, TRIG, CHOLHDL, LDLDIRECT in the last 72 hours. Thyroid Function Tests: No results for input(s): TSH, T4TOTAL, FREET4, T3FREE, THYROIDAB in the last 72 hours. Anemia Panel: No results for input(s): VITAMINB12, FOLATE, FERRITIN, TIBC, IRON, RETICCTPCT in the last 72 hours. Sepsis Labs: No results for input(s): PROCALCITON, LATICACIDVEN in the last 168 hours.  Recent Results (from the past 240 hour(s))  Culture, Urine     Status: None   Collection Time: 03/10/17  8:25 AM  Result Value Ref Range Status   Specimen Description URINE, CATHETERIZED  Final   Special Requests NONE  Final   Culture   Final    NO GROWTH Performed at [facility], [address] [place], [place] [address]    Report Status 03/11/2017 FINAL  Final         Radiology Studies: No results found.      Scheduled Meds: . aspirin EC  81 mg Oral Daily  . cloNIDine  0.1 mg Oral BID  . docusate sodium  100 mg Oral Q12H  . enoxaparin (LOVENOX) injection  40 mg Subcutaneous Q24H  . feeding supplement (ENSURE ENLIVE)  237 mL Oral BID BM  . furosemide  40 mg Oral BID  . hydrocortisone  1 application Rectal BID  . nebivolol  20 mg Oral Daily  . sodium chloride flush  3 mL Intravenous Q12H  . timolol  1 drop Both Eyes BID   Continuous Infusions: . sodium chloride       LOS: 3 days    Trinika Cortese Jaynie CollinsPrasad Trinton Prewitt, MD [facility] Pager [phone]  If 7PM-7AM, please contact night-coverage [email or web address] Password [email or web address] 03/12/2017, 1:43 PM

## 2017-03-12 NOTE — Evaluation (Addendum)
Physical Therapy Evaluation Patient Details Name: [name] Treat MRN: [id number] DOB: Jul 27, 1927 Today's Date: 03/12/2017   History of Present Illness  [name] Spiewak is an 83yo female with PMH DM, HTN, Dementia, CKD-IV, CAD/LBBB, chronic diastolic CHF, h/o DVT, PAD, who was [facility] with acute on chronic CHF exacerbation.  Clinical Impression  Pt admitted with above diagnosis. Pt currently with functional limitations due to the deficits listed below (see PT Problem List). Pt ambulated 140' with hand held assist of 1, SaO2 83% on room air at rest, 97% on 2L O2 walking.  Pt will benefit from skilled PT to increase their independence and safety with mobility to allow discharge to the venue listed below.       Follow Up Recommendations No PT follow up    Equipment Recommendations  Other (comment)(home O2)    Recommendations for Other Services       Precautions / Restrictions Precautions Precautions: None Precaution Comments: denies h/o falls in past 1 year Restrictions Weight Bearing Restrictions: No      Mobility  Bed Mobility Overal bed mobility: Independent                Transfers Overall transfer level: Independent Equipment used: None                Ambulation/Gait Ambulation/Gait assistance: Supervision Ambulation Distance (Feet): 140 Feet Assistive device: 1 person hand held assist Gait Pattern/deviations: WFL(Within Functional Limits)   Gait velocity interpretation: at or above normal speed for age/gender General Gait Details: SaO2 83% on room air at rest, 97% on 2L O2 walking, no LOB VCs for pursed lip breathing Stairs            Wheelchair Mobility    Modified Rankin (Stroke Patients Only)       Balance Overall balance assessment: Modified Independent                                           Pertinent Vitals/Pain Pain Assessment: No/denies pain    Home Living Family/patient expects to be discharged  to:: Private residence Living Arrangements: Children Available Help at Discharge: Available 24 hours/day;Family Type of Home: House Home Access: Stairs to enter Entrance Stairs-Rails: Can reach both Entrance Stairs-Number of Steps: 3 Home Layout: One level Home Equipment: Walker - 2 wheels;Bedside commode;Wheelchair - Lawyermanual;Shower seat Additional Comments: pt is a retired Charity fundraiserN; 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 and assist as needed    Prior Function Level of Independence: Independent               Hand Dominance   Dominant Hand: Right    Extremity/Trunk Assessment   Upper Extremity Assessment Upper Extremity Assessment: Overall WFL for tasks assessed    Lower Extremity Assessment Lower Extremity Assessment: Overall WFL for tasks assessed    Cervical / Trunk Assessment Cervical / Trunk Assessment: Normal  Communication   Communication: HOH  Cognition Arousal/Alertness: Awake/alert Behavior During Therapy: WFL for tasks assessed/performed Overall Cognitive Status: Within Functional Limits for tasks assessed                                        General Comments      Exercises     Assessment/Plan    PT Assessment Patient  needs continued PT services  PT Problem List Cardiopulmonary status limiting activity       PT Treatment Interventions Gait training    PT Goals (Current goals can be found in the Care Plan section)  Acute Rehab PT Goals Patient Stated Goal: return to independence PT Goal Formulation: With patient/family Time For Goal Achievement: 03/26/17 Potential to Achieve Goals: Good    Frequency Min 3X/week   Barriers to discharge        Co-evaluation               AM-PAC PT "6 Clicks" Daily Activity  Outcome Measure Difficulty turning over in bed (including adjusting bedclothes, sheets and blankets)?: None Difficulty moving from lying on back to sitting on the side of the bed? : None Difficulty sitting  down on and standing up from a chair with arms (Walters.g., wheelchair, bedside commode, etc,.)?: None Help needed moving to and from a bed to chair (including a wheelchair)?: None Help needed walking in hospital room?: None Help needed climbing 3-5 steps with a railing? : A Little 6 Click Score: 23    End of Session Equipment Utilized During Treatment: Gait belt;Oxygen Activity Tolerance: Patient tolerated treatment well Patient left: in chair;with call bell/phone within reach;with family/visitor present Nurse Communication: Mobility status PT Visit Diagnosis: Difficulty in walking, not elsewhere classified (R26.2)    Time: [id number] PT Time Calculation (min) (ACUTE ONLY): 28 min   Charges:   PT Evaluation $PT Eval Low Complexity: 1 Low PT Treatments $Gait Training: 8-22 mins   PT G Codes:        Tamala SerUhlenberg, Alanta Scobey Kistler 03/12/2017, 9:09 AM [phone]

## 2017-03-13 DIAGNOSIS — I10 Essential (primary) hypertension: Secondary | ICD-10-CM

## 2017-03-13 LAB — BASIC METABOLIC PANEL
ANION GAP: 9 (ref 5–15)
BUN: 62 mg/dL — ABNORMAL HIGH (ref 6–20)
CHLORIDE: 105 mmol/L (ref 101–111)
CO2: 25 mmol/L (ref 22–32)
Calcium: 8.5 mg/dL — ABNORMAL LOW (ref 8.9–10.3)
Creatinine, Ser: 1.95 mg/dL — ABNORMAL HIGH (ref 0.44–1.00)
GFR calc Af Amer: 25 mL/min — ABNORMAL LOW (ref >60)
GFR, EST NON AFRICAN AMERICAN: 22 mL/min — AB (ref >60)
GLUCOSE: 133 mg/dL — AB (ref 65–99)
POTASSIUM: 4.9 mmol/L (ref 3.5–5.1)
Sodium: 139 mmol/L (ref 135–145)

## 2017-03-13 MED ORDER — FUROSEMIDE 40 MG PO TABS: 40.0000 mg | ORAL_TABLET | Freq: Two times a day (BID) | ORAL | 0 refills | Status: DC

## 2017-03-13 NOTE — Evaluation (Signed)
Occupational Therapy Evaluation Patient Details Name: [name] MRN: [id number] DOB: 05/07/1927 Today's Date: 03/13/2017    History of Present Illness [name] is an 83yo female with PMH DM, HTN, Dementia, CKD-IV, CAD/LBBB, chronic diastolic CHF, h/o DVT, PAD, who [facility] with acute on chronic CHF exacerbation.   Clinical Impression   Pt overall S- I .  OT eval and education complete.    Follow Up Recommendations  No OT follow up;Supervision - Intermittent    Equipment Recommendations  None recommended by OT       Precautions / Restrictions Precautions Precautions: None Precaution Comments: denies h/o falls in past 1 year Restrictions Weight Bearing Restrictions: No      Mobility Bed Mobility Overal bed mobility: Independent                Transfers Overall transfer level: Independent Equipment used: None                  Balance Overall balance assessment: Modified Independent                                         ADL either performed or assessed with clinical judgement   ADL Overall ADL's : At baseline                                       General ADL Comments: pt at baseline per pt and friend.      Vision Patient Visual Report: No change from baseline              Pertinent Vitals/Pain Pain Assessment: No/denies pain     Hand Dominance Right   Extremity/Trunk Assessment         Cervical / Trunk Assessment Cervical / Trunk Assessment: Normal   Communication Communication Communication: HOH   Cognition Arousal/Alertness: Awake/alert Behavior During Therapy: WFL for tasks assessed/performed Overall Cognitive Status: Within Functional Limits for tasks assessed                                                Home Living Family/patient expects to be discharged to:: Private residence Living Arrangements: Children Available Help at Discharge: Available  24 hours/day;Family Type of Home: House Home Access: Stairs to enter Entergy CorporationEntrance Stairs-Number of Steps: 3 Entrance Stairs-Rails: Can reach both Home Layout: One level     Bathroom Shower/Tub: Tub/shower unit;Walk-in shower   Bathroom Toilet: Standard     Home Equipment: Environmental consultantWalker - 2 wheels;Bedside commode;Wheelchair - Sport and exercise psychologistmanual;Shower seat   Additional Comments: pt is a retired Charity fundraiserN; 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 and assist as needed      Prior Functioning/Environment Level of Independence: Independent                          OT Goals(Current goals can be found in the care plan section) Acute Rehab OT Goals Patient Stated Goal: return to independence OT Goal Formulation: With patient  OT Frequency:      AM-PAC PT "6 Clicks" Daily Activity     Outcome Measure Help from another person eating meals?: None Help  from another person taking care of personal grooming?: None Help from another person toileting, which includes using toliet, bedpan, or urinal?: None Help from another person bathing (including washing, rinsing, drying)?: None Help from another person to put on and taking off regular upper body clothing?: None Help from another person to put on and taking off regular lower body clothing?: None 6 Click Score: 24   End of Session    Activity Tolerance: Patient tolerated treatment well Patient left: in chair;with family/visitor present                   Time: [id number] OT Time Calculation (min): 11 min Charges:  OT General Charges $OT Visit: 1 Visit OT Evaluation $OT Eval Moderate Complexity: 1 Mod G-Codes:     Lise AuerLori Asmaa [place], [place] [phone]  Einar CrowEDDING, Adelene Polivka D 03/13/2017, 1:44 PM

## 2017-03-13 NOTE — Progress Notes (Signed)
Patient verbalized understanding of discharge instructions. AVS given to patient.

## 2017-03-13 NOTE — Discharge Summary (Signed)
Physician Discharge Summary  Ashley RheinDoris E Walters NWG:[id number] DOB: 1927-07-18 DOA: 03/09/2017  PCP: Alwyn RenMathews, Elizabeth G, MD  Admit date: 03/09/2017 Discharge date: 03/13/2017  Admitted From:home Disposition:home  Recommendations for Outpatient Follow-up:  1. Follow up with PCP in 1-2 weeks 2. Please obtain BMP/CBC in one week   Home Health:yes Equipment/Devices:none Discharge Condition:stable CODE STATUS:DNR Diet recommendation:heart healthy  Brief/Interim Summary: 83 year old female with history of congestive heart failure, dementia, rectal prolapse, admitted with shortness of breath.  Patient was discharged from the hospital on 02/09/2017 when she was admitted for acute on chronic CHF.  She has a history of chronic rectal prolapse which she is fixated with and wanted to have surgery done though she was told many times she is not a surgical candidate.  #Acute on chronic combined systolic and diastolic congestive heart failure:  -Treated with IV Lasix with clinical improvement.  Denies shortness of breath or chest pain.  Increase the dose of Lasix to 40 mg twice a day on discharge.  Recommended to follow-up with PCP.  Low-salt diet advised.  #Hypertension: Resume home medication.  Monitor blood pressure.  #Dementia with hallucination: Continue supportive care.  Home care services ordered on discharge.  # Chronic rectal prolapse: As per patient and prior notes it is chronic and patient has appointment with colorectal surgeon on January 7.  I recommended patient to keep outpatient follow-up.  Continue stool softener.  I have discussed with surgeon Dr.Wakefield on 12/23, who recommended supportive care including stool softener and outpatient follow-up.  #Chronic kidney disease stage III: Serum creatinine level around baseline.  Monitor BMP.    #Acute respiratory failure with hypoxia: Patient failed ambulatory oxygen assessment.  Requiring 2 L of oxygen.  Discharging with oxygen by  nasal cannula.  Recommend to follow-up with PCP.  Patient is clinically improved.  Recommended outpatient follow-up.  Patient was seen by palliative care.  She is DNR/DNI.   Discharge Diagnoses:  Principal problem: Acute on chronic combined systolic and diastolic congestive heart failure. Active Problems:   CKD (chronic kidney disease), stage III (HCC)   CHF (congestive heart failure) (HCC)    Discharge Instructions  Discharge Instructions    Call MD for:  difficulty breathing, headache or visual disturbances   Complete by:  As directed    Call MD for:  extreme fatigue   Complete by:  As directed    Call MD for:  hives   Complete by:  As directed    Call MD for:  persistant dizziness or light-headedness   Complete by:  As directed    Call MD for:  persistant nausea and vomiting   Complete by:  As directed    Call MD for:  severe uncontrolled pain   Complete by:  As directed    Call MD for:  temperature >100.4   Complete by:  As directed    Diet - low sodium heart healthy   Complete by:  As directed    Discharge instructions   Complete by:  As directed    Please follow-up with your surgeon as a scheduled.   Increase activity slowly   Complete by:  As directed      Allergies as of 03/13/2017      Reactions   Namzaric [memantine Hcl-donepezil Hcl] Nausea Only   confusion   Percocet [oxycodone-acetaminophen] Other (See Comments)   loosy goosy   Sulfa Antibiotics Other (See Comments)   Tongue swells   Adhesive [tape] Other (See Comments)   Tears skin.  Please use "paper" tape   Metformin And Related Diarrhea   Abnormal Kidney Functions    Tradjenta [linagliptin]    Low back ache, resolved once medication stopped    Penicillins Hives   Tolerates Ceftriaxone, Has patient had a PCN reaction causing immediate rash, facial/tongue/throat swelling, SOB or lightheadedness with hypotension: Unknown Has patient had a PCN reaction causing severe rash involving mucus membranes  or skin necrosis: No Has patient had a PCN reaction that required hospitalization No Has patient had a PCN reaction occurring within the last 10 years: No If all of the above answers are "NO", then may proceed with Cephalosporin use.      Medication List    TAKE these medications   aspirin EC 81 MG tablet Take 1 tablet (81 mg total) by mouth daily.   BYSTOLIC 20 MG Tabs Generic drug:  Nebivolol HCl TAKE 1 TABLET (20 MG TOTAL) BY MOUTH DAILY. FOR BLOOD PRESSURE   cloNIDine 0.1 MG tablet Commonly known as:  CATAPRES Take 1 tablet (0.1 mg total) by mouth 2 (two) times daily.   docusate sodium 100 MG capsule Commonly known as:  COLACE Take 1 capsule (100 mg total) by mouth every 12 (twelve) hours.   ezetimibe-simvastatin 10-20 MG tablet Commonly known as:  VYTORIN TAKE 1/2 TABLET BY MOUTH ONCE NIGHTLY TO CONTROL CHOLESTEROL   feeding supplement (ENSURE ENLIVE) Liqd Take 237 mLs by mouth 2 (two) times daily between meals.   furosemide 40 MG tablet Commonly known as:  LASIX Take 1 tablet (40 mg total) by mouth 2 (two) times daily. What changed:    medication strength  See the new instructions.   JANUVIA 100 MG tablet Generic drug:  sitaGLIPtin Take 1 tablet (100 mg total) by mouth daily.   Loteprednol Etabonate 0.5 % Gel Commonly known as:  LOTEMAX Place 1 drop into the left eye daily. PLACE 1 DROP IN THE LEFT EYE TWICE DAILY   nitroGLYCERIN 0.4 MG SL tablet Commonly known as:  NITROSTAT Place one under tongue for chest pain, up to 3 tablets   polyethylene glycol powder powder Commonly known as:  GLYCOLAX/MIRALAX Take 17 g by mouth daily as needed.   PROCTOZONE-HC 2.5 % rectal cream Generic drug:  hydrocortisone Place 1 application 2 (two) times daily rectally.   hydrocortisone 2.5 % rectal cream Commonly known as:  ANUSOL-HC Place 1 application rectally 3 (three) times daily.   timolol 0.5 % ophthalmic solution Commonly known as:  BETIMOL INSTILL 1 DROP  INTO THE LEFT EYE DAILY      Follow-up Information    Alwyn Ren, MD. Schedule an appointment as soon as possible for a visit in 1 week(s).   Specialty:  Internal Medicine Contact information: [address] [place] [address] [phone]          Allergies  Allergen Reactions  . Namzaric [Memantine Hcl-Donepezil Hcl] Nausea Only    confusion  . Percocet [Oxycodone-Acetaminophen] Other (See Comments)    loosy goosy  . Sulfa Antibiotics Other (See Comments)    Tongue swells  . Adhesive [Tape] Other (See Comments)    Tears skin.  Please use "paper" tape  . Metformin And Related Diarrhea    Abnormal Kidney Functions   . Tradjenta [Linagliptin]     Low back ache, resolved once medication stopped   . Penicillins Hives    Tolerates Ceftriaxone, Has patient had a PCN reaction causing immediate rash, facial/tongue/throat swelling, SOB or lightheadedness with hypotension: Unknown Has  patient had a PCN reaction causing severe rash involving mucus membranes or skin necrosis: No Has patient had a PCN reaction that required hospitalization No Has patient had a PCN reaction occurring within the last 10 years: No If all of the above answers are "NO", then may proceed with Cephalosporin use.     Consultations: Palliative care  Procedures/Studies: None  Subjective: Seen and examined at bedside.  Denies headache, dizziness, nausea vomiting chest pain shortness of breath.  Discharge Exam: Vitals:   03/12/17 2200 03/13/17 0504  BP: (!) 198/44 (!) 180/44  Pulse: 75 (!) 53  Resp: 18 20  Temp: 97.6 F (36.4 C) 97.6 F (36.4 C)  SpO2: 100% 100%   Vitals:   03/12/17 0905 03/12/17 1506 03/12/17 2200 03/13/17 0504  BP:  111/76 (!) 198/44 (!) 180/44  Pulse:  (!) 57 75 (!) 53  Resp:  18 18 20   Temp:  97.6 F (36.4 C) 97.6 F (36.4 C) 97.6 F (36.4 C)  TempSrc:  Oral Oral Oral  SpO2: (!) 83% 100% 100% 100%  Weight:    54.7 kg (120 lb 9.5 oz)  Height:         General: Pt is alert, awake, not in acute distress Cardiovascular: RRR, S1/S2 +, no rubs, no gallops Respiratory: CTA bilaterally, no wheezing, no rhonchi Abdominal: Soft, NT, ND, bowel sounds + Extremities: no edema, no cyanosis    The results of significant diagnostics from this hospitalization (including imaging, microbiology, ancillary and laboratory) are listed below for reference.     Microbiology: Recent Results (from the past 240 hour(s))  Culture, Urine     Status: None   Collection Time: 03/10/17  8:25 AM  Result Value Ref Range Status   Specimen Description URINE, CATHETERIZED  Final   Special Requests NONE  Final   Culture   Final    NO GROWTH Performed at [facility], [address] [place], [place] [address]    Report Status 03/11/2017 FINAL  Final     Labs: BNP (last 3 results) Recent Labs    02/04/17 1950 03/09/17 1100  BNP 1,024.7* 3,635.9*   Basic Metabolic Panel: Recent Labs  Lab 03/09/17 1100 03/10/17 0023 03/11/17 0539 03/12/17 0458 03/13/17 0529  NA 142 141 140 140 139  K 4.4 4.0 4.6 4.7 4.9  CL 113* 111 107 105 105  CO2 22 25 23 29 25   GLUCOSE 186* 146* 124* 152* 133*  BUN 33* 33* 40* 51* 62*  CREATININE 1.75* 1.76* 2.05* 1.99* 1.95*  CALCIUM 8.6* 8.1* 8.2* 8.1* 8.5*  MG  --   --   --  2.3  --    Liver Function Tests: Recent Labs  Lab 03/11/17 0539  AST 11*  ALT 7*  ALKPHOS 41  BILITOT 0.5  PROT 5.0*  ALBUMIN 2.6*   No results for input(s): LIPASE, AMYLASE in the last 168 hours. No results for input(s): AMMONIA in the last 168 hours. CBC: Recent Labs  Lab 03/09/17 1100 03/12/17 0458  WBC 8.1 4.8  NEUTROABS 7.0  --   HGB 9.8* 9.3*  HCT 31.5* 29.1*  MCV 93.5 91.5  PLT 230 211   Cardiac Enzymes: Recent Labs  Lab 03/09/17 1820 03/10/17 0023 03/10/17 0729  TROPONINI 0.05* 0.05* 0.04*   BNP: Invalid input(s): POCBNP CBG: Recent Labs  Lab 03/11/17 2032 03/12/17 0754 03/12/17 1158 03/12/17 1852  03/12/17 2158  GLUCAP 186* 143* 186* 147* 172*   D-Dimer No results for input(s): DDIMER in  the last 72 hours. Hgb A1c No results for input(s): HGBA1C in the last 72 hours. Lipid Profile No results for input(s): CHOL, HDL, LDLCALC, TRIG, CHOLHDL, LDLDIRECT in the last 72 hours. Thyroid function studies No results for input(s): TSH, T4TOTAL, T3FREE, THYROIDAB in the last 72 hours.  Invalid input(s): FREET3 Anemia work up No results for input(s): VITAMINB12, FOLATE, FERRITIN, TIBC, IRON, RETICCTPCT in the last 72 hours. Urinalysis    Component Value Date/Time   COLORURINE YELLOW 02/04/2017 1615   COLORURINE YELLOW 02/04/2017 1615   APPEARANCEUR (A) 02/04/2017 1615    QUESTIONABLE RESULTS, RECOMMEND RECOLLECT TO VERIFY   APPEARANCEUR CLEAR 02/04/2017 1615   APPEARANCEUR Clear 02/21/2013 1622   LABSPEC  02/04/2017 1615    QUESTIONABLE RESULTS, RECOMMEND RECOLLECT TO VERIFY   LABSPEC 1.008 02/04/2017 1615   PHURINE  02/04/2017 1615    QUESTIONABLE RESULTS, RECOMMEND RECOLLECT TO VERIFY   PHURINE 5.0 02/04/2017 1615   GLUCOSEU (A) 02/04/2017 1615    QUESTIONABLE RESULTS, RECOMMEND RECOLLECT TO VERIFY   GLUCOSEU NEGATIVE 02/04/2017 1615   HGBUR (A) 02/04/2017 1615    QUESTIONABLE RESULTS, RECOMMEND RECOLLECT TO VERIFY   HGBUR SMALL (A) 02/04/2017 1615   BILIRUBINUR (A) 02/04/2017 1615    QUESTIONABLE RESULTS, RECOMMEND RECOLLECT TO VERIFY   BILIRUBINUR NEGATIVE 02/04/2017 1615   BILIRUBINUR Negative 02/21/2013 1622   KETONESUR (A) 02/04/2017 1615    QUESTIONABLE RESULTS, RECOMMEND RECOLLECT TO VERIFY   KETONESUR NEGATIVE 02/04/2017 1615   PROTEINUR (A) 02/04/2017 1615    QUESTIONABLE RESULTS, RECOMMEND RECOLLECT TO VERIFY   PROTEINUR 100 (A) 02/04/2017 1615   UROBILINOGEN 0.2 12/23/2014 1513   NITRITE (A) 02/04/2017 1615    QUESTIONABLE RESULTS, RECOMMEND RECOLLECT TO VERIFY   NITRITE NEGATIVE 02/04/2017 1615   LEUKOCYTESUR (A) 02/04/2017 1615    QUESTIONABLE RESULTS,  RECOMMEND RECOLLECT TO VERIFY   LEUKOCYTESUR MODERATE (A) 02/04/2017 1615   LEUKOCYTESUR Negative 02/21/2013 1622   Sepsis Labs Invalid input(s): PROCALCITONIN,  WBC,  LACTICIDVEN Microbiology Recent Results (from the past 240 hour(s))  Culture, Urine     Status: None   Collection Time: 03/10/17  8:25 AM  Result Value Ref Range Status   Specimen Description URINE, CATHETERIZED  Final   Special Requests NONE  Final   Culture   Final    NO GROWTH Performed at [facility], [address], [place], [place] [address]    Report Status 03/11/2017 FINAL  Final     Time coordinating discharge: 28 minutes  SIGNED:   Maxie Barb, MD  [facility] 03/13/2017, 10:14 AM  If 7PM-7AM, please contact night-coverage [email or web address] Password [email or web address]

## 2017-03-15 ENCOUNTER — Telehealth: Payer: Self-pay

## 2017-03-15 NOTE — Telephone Encounter (Signed)
Transition Care Management Follow-Up Telephone Call   Date discharged and where:[facility] on 03/13/2017  How have you been since you were released from the hospital? Per pt daughter she looks good and is eating well.Seems very angry about physical state.   Any patient concerns? None  Items Reviewed:   Meds: Y  Allergies: Y  Dietary Changes Reviewed: Y  Functional Questionnaire:  Independent-I Dependent-D  ADLs:   Dressing- I    Eating-I   Maintaining continence- I   Transferring- Refuses   Transportation- D   Meal Prep-D   Managing Meds- I w/ assistance  Confirmed importance and Date/Time of follow-up visits scheduled: Yes, Dr. Montez Moritaarter 03/24/2017 @ 12pm   Confirmed with patient if condition worsens to call PCP or go to the Emergency Dept. Patient was given office number and encouraged to call back with questions or concerns: Yes

## 2017-03-19 ENCOUNTER — Encounter (HOSPITAL_COMMUNITY): Payer: Self-pay | Admitting: Emergency Medicine

## 2017-03-19 ENCOUNTER — Inpatient Hospital Stay (HOSPITAL_COMMUNITY)
Admission: EM | Admit: 2017-03-19 | Discharge: 2017-03-22 | DRG: 291 | Disposition: A | Discharge status: Home Health | Payer: Medicare Other | Attending: Internal Medicine | Admitting: Internal Medicine

## 2017-03-19 DIAGNOSIS — Z91148 Patient's other noncompliance with medication regimen for other reason: Secondary | ICD-10-CM | POA: Diagnosis present

## 2017-03-19 DIAGNOSIS — F0390 Unspecified dementia without behavioral disturbance: Secondary | ICD-10-CM | POA: Diagnosis present

## 2017-03-19 DIAGNOSIS — I130 Hypertensive heart and chronic kidney disease with heart failure and stage 1 through stage 4 chronic kidney disease, or unspecified chronic kidney disease: Secondary | ICD-10-CM | POA: Diagnosis not present

## 2017-03-19 DIAGNOSIS — R0602 Shortness of breath: Secondary | ICD-10-CM

## 2017-03-19 DIAGNOSIS — Z888 Allergy status to other drugs, medicaments and biological substances status: Secondary | ICD-10-CM

## 2017-03-19 DIAGNOSIS — Z9842 Cataract extraction status, left eye: Secondary | ICD-10-CM

## 2017-03-19 DIAGNOSIS — Z87891 Personal history of nicotine dependence: Secondary | ICD-10-CM

## 2017-03-19 DIAGNOSIS — I252 Old myocardial infarction: Secondary | ICD-10-CM

## 2017-03-19 DIAGNOSIS — E1122 Type 2 diabetes mellitus with diabetic chronic kidney disease: Secondary | ICD-10-CM | POA: Diagnosis present

## 2017-03-19 DIAGNOSIS — I447 Left bundle-branch block, unspecified: Secondary | ICD-10-CM | POA: Diagnosis present

## 2017-03-19 DIAGNOSIS — Z955 Presence of coronary angioplasty implant and graft: Secondary | ICD-10-CM

## 2017-03-19 DIAGNOSIS — Z9119 Patient's noncompliance with other medical treatment and regimen: Secondary | ICD-10-CM

## 2017-03-19 DIAGNOSIS — I10 Essential (primary) hypertension: Secondary | ICD-10-CM | POA: Diagnosis present

## 2017-03-19 DIAGNOSIS — Z9114 Patient's other noncompliance with medication regimen: Secondary | ICD-10-CM

## 2017-03-19 DIAGNOSIS — Z7982 Long term (current) use of aspirin: Secondary | ICD-10-CM

## 2017-03-19 DIAGNOSIS — Z8673 Personal history of transient ischemic attack (TIA), and cerebral infarction without residual deficits: Secondary | ICD-10-CM

## 2017-03-19 DIAGNOSIS — I509 Heart failure, unspecified: Secondary | ICD-10-CM

## 2017-03-19 DIAGNOSIS — R0902 Hypoxemia: Secondary | ICD-10-CM | POA: Diagnosis present

## 2017-03-19 DIAGNOSIS — E785 Hyperlipidemia, unspecified: Secondary | ICD-10-CM | POA: Diagnosis present

## 2017-03-19 DIAGNOSIS — H409 Unspecified glaucoma: Secondary | ICD-10-CM | POA: Diagnosis present

## 2017-03-19 DIAGNOSIS — Z885 Allergy status to narcotic agent status: Secondary | ICD-10-CM

## 2017-03-19 DIAGNOSIS — E1121 Type 2 diabetes mellitus with diabetic nephropathy: Secondary | ICD-10-CM | POA: Diagnosis present

## 2017-03-19 DIAGNOSIS — N183 Chronic kidney disease, stage 3 (moderate): Secondary | ICD-10-CM | POA: Diagnosis present

## 2017-03-19 DIAGNOSIS — R4189 Other symptoms and signs involving cognitive functions and awareness: Secondary | ICD-10-CM | POA: Diagnosis present

## 2017-03-19 DIAGNOSIS — I5043 Acute on chronic combined systolic (congestive) and diastolic (congestive) heart failure: Secondary | ICD-10-CM | POA: Diagnosis present

## 2017-03-19 DIAGNOSIS — I2510 Atherosclerotic heart disease of native coronary artery without angina pectoris: Secondary | ICD-10-CM | POA: Diagnosis present

## 2017-03-19 DIAGNOSIS — I255 Ischemic cardiomyopathy: Secondary | ICD-10-CM | POA: Diagnosis present

## 2017-03-19 DIAGNOSIS — M109 Gout, unspecified: Secondary | ICD-10-CM | POA: Diagnosis present

## 2017-03-19 DIAGNOSIS — L8990 Pressure ulcer of unspecified site, unspecified stage: Secondary | ICD-10-CM

## 2017-03-19 DIAGNOSIS — J9601 Acute respiratory failure with hypoxia: Secondary | ICD-10-CM | POA: Diagnosis present

## 2017-03-19 DIAGNOSIS — Z833 Family history of diabetes mellitus: Secondary | ICD-10-CM

## 2017-03-19 DIAGNOSIS — Z882 Allergy status to sulfonamides status: Secondary | ICD-10-CM

## 2017-03-19 DIAGNOSIS — Z9841 Cataract extraction status, right eye: Secondary | ICD-10-CM

## 2017-03-19 DIAGNOSIS — K623 Rectal prolapse: Secondary | ICD-10-CM | POA: Diagnosis present

## 2017-03-19 DIAGNOSIS — N179 Acute kidney failure, unspecified: Secondary | ICD-10-CM | POA: Diagnosis present

## 2017-03-19 DIAGNOSIS — K649 Unspecified hemorrhoids: Secondary | ICD-10-CM | POA: Diagnosis present

## 2017-03-19 LAB — I-STAT CHEM 8, ED
BUN: 58 mg/dL — AB (ref 6–20)
CALCIUM ION: 1.13 mmol/L — AB (ref 1.15–1.40)
CHLORIDE: 109 mmol/L (ref 101–111)
CREATININE: 2 mg/dL — AB (ref 0.44–1.00)
GLUCOSE: 182 mg/dL — AB (ref 65–99)
HCT: 29 % — ABNORMAL LOW (ref 36.0–46.0)
Hemoglobin: 9.9 g/dL — ABNORMAL LOW (ref 12.0–15.0)
POTASSIUM: 4.6 mmol/L (ref 3.5–5.1)
Sodium: 145 mmol/L (ref 135–145)
TCO2: 24 mmol/L (ref 22–32)

## 2017-03-19 NOTE — ED Notes (Signed)
Bed: WA20 Expected date:  Expected time:  Means of arrival:  Comments: EMS 

## 2017-03-19 NOTE — ED Triage Notes (Signed)
Per EMS, pt. From home with complaint of hypertension , initial BP 200/1364mmhg, also reported of bil. Leg swelling.denied pain , denied SOB, denied chest pain. Alert and oriented x3. Pt. Stated that she "just noticed the swelling on her legs this afternoon" . No pain .

## 2017-03-20 ENCOUNTER — Other Ambulatory Visit: Payer: Self-pay

## 2017-03-20 ENCOUNTER — Emergency Department (HOSPITAL_COMMUNITY): Payer: Medicare Other

## 2017-03-20 ENCOUNTER — Encounter (HOSPITAL_COMMUNITY): Payer: Self-pay | Admitting: Internal Medicine

## 2017-03-20 DIAGNOSIS — I10 Essential (primary) hypertension: Secondary | ICD-10-CM

## 2017-03-20 DIAGNOSIS — F0390 Unspecified dementia without behavioral disturbance: Secondary | ICD-10-CM | POA: Diagnosis present

## 2017-03-20 DIAGNOSIS — R0602 Shortness of breath: Secondary | ICD-10-CM | POA: Diagnosis present

## 2017-03-20 DIAGNOSIS — K623 Rectal prolapse: Secondary | ICD-10-CM

## 2017-03-20 DIAGNOSIS — E1121 Type 2 diabetes mellitus with diabetic nephropathy: Secondary | ICD-10-CM

## 2017-03-20 DIAGNOSIS — J9602 Acute respiratory failure with hypercapnia: Secondary | ICD-10-CM | POA: Diagnosis not present

## 2017-03-20 DIAGNOSIS — E785 Hyperlipidemia, unspecified: Secondary | ICD-10-CM

## 2017-03-20 DIAGNOSIS — I5043 Acute on chronic combined systolic (congestive) and diastolic (congestive) heart failure: Secondary | ICD-10-CM | POA: Diagnosis present

## 2017-03-20 DIAGNOSIS — R4189 Other symptoms and signs involving cognitive functions and awareness: Secondary | ICD-10-CM | POA: Diagnosis present

## 2017-03-20 DIAGNOSIS — M109 Gout, unspecified: Secondary | ICD-10-CM | POA: Diagnosis present

## 2017-03-20 DIAGNOSIS — I447 Left bundle-branch block, unspecified: Secondary | ICD-10-CM | POA: Diagnosis present

## 2017-03-20 DIAGNOSIS — N179 Acute kidney failure, unspecified: Secondary | ICD-10-CM | POA: Diagnosis present

## 2017-03-20 DIAGNOSIS — I130 Hypertensive heart and chronic kidney disease with heart failure and stage 1 through stage 4 chronic kidney disease, or unspecified chronic kidney disease: Secondary | ICD-10-CM | POA: Diagnosis present

## 2017-03-20 DIAGNOSIS — Z882 Allergy status to sulfonamides status: Secondary | ICD-10-CM | POA: Diagnosis not present

## 2017-03-20 DIAGNOSIS — Z8673 Personal history of transient ischemic attack (TIA), and cerebral infarction without residual deficits: Secondary | ICD-10-CM | POA: Diagnosis not present

## 2017-03-20 DIAGNOSIS — I252 Old myocardial infarction: Secondary | ICD-10-CM | POA: Diagnosis not present

## 2017-03-20 DIAGNOSIS — Z888 Allergy status to other drugs, medicaments and biological substances status: Secondary | ICD-10-CM | POA: Diagnosis not present

## 2017-03-20 DIAGNOSIS — N183 Chronic kidney disease, stage 3 (moderate): Secondary | ICD-10-CM | POA: Diagnosis present

## 2017-03-20 DIAGNOSIS — I2510 Atherosclerotic heart disease of native coronary artery without angina pectoris: Secondary | ICD-10-CM | POA: Diagnosis present

## 2017-03-20 DIAGNOSIS — Z833 Family history of diabetes mellitus: Secondary | ICD-10-CM | POA: Diagnosis not present

## 2017-03-20 DIAGNOSIS — E1122 Type 2 diabetes mellitus with diabetic chronic kidney disease: Secondary | ICD-10-CM | POA: Diagnosis present

## 2017-03-20 DIAGNOSIS — I255 Ischemic cardiomyopathy: Secondary | ICD-10-CM | POA: Diagnosis present

## 2017-03-20 DIAGNOSIS — Z87891 Personal history of nicotine dependence: Secondary | ICD-10-CM | POA: Diagnosis not present

## 2017-03-20 DIAGNOSIS — Z9841 Cataract extraction status, right eye: Secondary | ICD-10-CM | POA: Diagnosis not present

## 2017-03-20 DIAGNOSIS — Z7982 Long term (current) use of aspirin: Secondary | ICD-10-CM | POA: Diagnosis not present

## 2017-03-20 DIAGNOSIS — Z885 Allergy status to narcotic agent status: Secondary | ICD-10-CM | POA: Diagnosis not present

## 2017-03-20 DIAGNOSIS — Z9114 Patient's other noncompliance with medication regimen: Secondary | ICD-10-CM

## 2017-03-20 DIAGNOSIS — J9601 Acute respiratory failure with hypoxia: Secondary | ICD-10-CM | POA: Diagnosis present

## 2017-03-20 DIAGNOSIS — Z955 Presence of coronary angioplasty implant and graft: Secondary | ICD-10-CM | POA: Diagnosis not present

## 2017-03-20 DIAGNOSIS — Z9842 Cataract extraction status, left eye: Secondary | ICD-10-CM | POA: Diagnosis not present

## 2017-03-20 DIAGNOSIS — R0902 Hypoxemia: Secondary | ICD-10-CM

## 2017-03-20 DIAGNOSIS — H409 Unspecified glaucoma: Secondary | ICD-10-CM | POA: Diagnosis present

## 2017-03-20 LAB — URINALYSIS, ROUTINE W REFLEX MICROSCOPIC
Bilirubin Urine: NEGATIVE
Glucose, UA: 50 mg/dL — AB
HGB URINE DIPSTICK: NEGATIVE
Ketones, ur: NEGATIVE mg/dL
Leukocytes, UA: NEGATIVE
NITRITE: NEGATIVE
PH: 6 (ref 5.0–8.0)
Protein, ur: 100 mg/dL — AB
RBC / HPF: NONE SEEN RBC/hpf (ref 0–5)
SPECIFIC GRAVITY, URINE: 1.014 (ref 1.005–1.030)
Squamous Epithelial / LPF: NONE SEEN

## 2017-03-20 LAB — TSH: TSH: 3.95 u[IU]/mL (ref 0.350–4.500)

## 2017-03-20 LAB — COMPREHENSIVE METABOLIC PANEL
ALK PHOS: 52 U/L (ref 38–126)
ALT: 10 U/L — AB (ref 14–54)
AST: 17 U/L (ref 15–41)
Albumin: 3.2 g/dL — ABNORMAL LOW (ref 3.5–5.0)
Anion gap: 7 (ref 5–15)
BILIRUBIN TOTAL: 0.4 mg/dL (ref 0.3–1.2)
BUN: 65 mg/dL — ABNORMAL HIGH (ref 6–20)
CALCIUM: 8.2 mg/dL — AB (ref 8.9–10.3)
CO2: 25 mmol/L (ref 22–32)
CREATININE: 2.18 mg/dL — AB (ref 0.44–1.00)
Chloride: 110 mmol/L (ref 101–111)
GFR calc non Af Amer: 19 mL/min — ABNORMAL LOW (ref >60)
GFR, EST AFRICAN AMERICAN: 22 mL/min — AB (ref >60)
GLUCOSE: 194 mg/dL — AB (ref 65–99)
Potassium: 4.7 mmol/L (ref 3.5–5.1)
SODIUM: 142 mmol/L (ref 135–145)
Total Protein: 6.1 g/dL — ABNORMAL LOW (ref 6.5–8.1)

## 2017-03-20 LAB — CBC WITH DIFFERENTIAL/PLATELET
Basophils Absolute: 0 10*3/uL (ref 0.0–0.1)
Basophils Relative: 0 %
EOS ABS: 0.1 10*3/uL (ref 0.0–0.7)
EOS PCT: 3 %
HCT: 28.7 % — ABNORMAL LOW (ref 36.0–46.0)
Hemoglobin: 9.2 g/dL — ABNORMAL LOW (ref 12.0–15.0)
LYMPHS ABS: 0.8 10*3/uL (ref 0.7–4.0)
Lymphocytes Relative: 20 %
MCH: 30.4 pg (ref 26.0–34.0)
MCHC: 32.1 g/dL (ref 30.0–36.0)
MCV: 94.7 fL (ref 78.0–100.0)
MONOS PCT: 9 %
Monocytes Absolute: 0.4 10*3/uL (ref 0.1–1.0)
Neutro Abs: 2.8 10*3/uL (ref 1.7–7.7)
Neutrophils Relative %: 68 %
PLATELETS: 256 10*3/uL (ref 150–400)
RBC: 3.03 MIL/uL — ABNORMAL LOW (ref 3.87–5.11)
RDW: 16 % — AB (ref 11.5–15.5)
WBC: 4.1 10*3/uL (ref 4.0–10.5)

## 2017-03-20 LAB — CBC
HEMATOCRIT: 29 % — AB (ref 36.0–46.0)
HEMOGLOBIN: 9.3 g/dL — AB (ref 12.0–15.0)
MCH: 30.5 pg (ref 26.0–34.0)
MCHC: 32.1 g/dL (ref 30.0–36.0)
MCV: 95.1 fL (ref 78.0–100.0)
Platelets: 245 10*3/uL (ref 150–400)
RBC: 3.05 MIL/uL — AB (ref 3.87–5.11)
RDW: 15.9 % — ABNORMAL HIGH (ref 11.5–15.5)
WBC: 4.2 10*3/uL (ref 4.0–10.5)

## 2017-03-20 LAB — CREATININE, SERUM
CREATININE: 1.96 mg/dL — AB (ref 0.44–1.00)
GFR calc Af Amer: 25 mL/min — ABNORMAL LOW (ref >60)
GFR, EST NON AFRICAN AMERICAN: 22 mL/min — AB (ref >60)

## 2017-03-20 LAB — GLUCOSE, CAPILLARY
GLUCOSE-CAPILLARY: 157 mg/dL — AB (ref 65–99)
GLUCOSE-CAPILLARY: 176 mg/dL — AB (ref 65–99)
Glucose-Capillary: 139 mg/dL — ABNORMAL HIGH (ref 65–99)

## 2017-03-20 LAB — I-STAT TROPONIN, ED: TROPONIN I, POC: 0.02 ng/mL (ref 0.00–0.08)

## 2017-03-20 LAB — HEMOGLOBIN A1C
HEMOGLOBIN A1C: 6.5 % — AB (ref 4.8–5.6)
Mean Plasma Glucose: 139.85 mg/dL

## 2017-03-20 LAB — MAGNESIUM: MAGNESIUM: 2.3 mg/dL (ref 1.7–2.4)

## 2017-03-20 LAB — BRAIN NATRIURETIC PEPTIDE: B Natriuretic Peptide: 1636.5 pg/mL — ABNORMAL HIGH (ref 0.0–100.0)

## 2017-03-20 MED ORDER — ASPIRIN EC 81 MG PO TBEC
81.0000 mg | DELAYED_RELEASE_TABLET | Freq: Every day | ORAL | Status: DC
  Administered 2017-03-20 – 2017-03-22 (×3): 81 mg via ORAL
  Filled 2017-03-20 (×3): qty 1

## 2017-03-20 MED ORDER — ENSURE ENLIVE PO LIQD
237.0000 mL | Freq: Two times a day (BID) | ORAL | Status: DC
  Administered 2017-03-20 – 2017-03-21 (×2): 237 mL via ORAL

## 2017-03-20 MED ORDER — ACETAMINOPHEN 325 MG PO TABS: 650.0000 mg | ORAL_TABLET | ORAL | Status: DC | PRN

## 2017-03-20 MED ORDER — LOTEPREDNOL ETABONATE 0.5 % OP SUSP
1.0000 [drp] | Freq: Two times a day (BID) | OPHTHALMIC | Status: DC
  Administered 2017-03-20 – 2017-03-22 (×5): 1 [drp] via OPHTHALMIC
  Filled 2017-03-20: qty 5

## 2017-03-20 MED ORDER — NEBIVOLOL HCL 10 MG PO TABS
20.0000 mg | ORAL_TABLET | Freq: Every day | ORAL | Status: DC
  Administered 2017-03-20: 20 mg via ORAL
  Administered 2017-03-21: 10 mg via ORAL
  Administered 2017-03-22: 20 mg via ORAL
  Filled 2017-03-20 (×3): qty 2

## 2017-03-20 MED ORDER — EZETIMIBE-SIMVASTATIN 10-20 MG PO TABS: 1.0000 | ORAL_TABLET | Freq: Every day | ORAL | Status: DC

## 2017-03-20 MED ORDER — HYDROCORTISONE 2.5 % RE CREA
1.0000 "application " | TOPICAL_CREAM | Freq: Three times a day (TID) | RECTAL | Status: DC
  Administered 2017-03-20 – 2017-03-22 (×4): 1 via RECTAL
  Filled 2017-03-20: qty 28.35

## 2017-03-20 MED ORDER — TIMOLOL HEMIHYDRATE 0.5 % OP SOLN
1.0000 [drp] | Freq: Every day | OPHTHALMIC | Status: DC
Start: 2017-03-20 — End: 2017-03-20

## 2017-03-20 MED ORDER — HEPARIN SODIUM (PORCINE) 5000 UNIT/ML IJ SOLN
5000.0000 [IU] | Freq: Three times a day (TID) | INTRAMUSCULAR | Status: DC
  Administered 2017-03-20 – 2017-03-22 (×5): 5000 [IU] via SUBCUTANEOUS
  Filled 2017-03-20 (×5): qty 1

## 2017-03-20 MED ORDER — NEBIVOLOL HCL 20 MG PO TABS: 20.0000 mg | ORAL_TABLET | Freq: Every day | ORAL | Status: DC

## 2017-03-20 MED ORDER — POLYETHYLENE GLYCOL 3350 17 G PO PACK: 17.0000 g | PACK | Freq: Every day | ORAL | Status: DC | PRN

## 2017-03-20 MED ORDER — ONDANSETRON HCL 4 MG/2ML IJ SOLN: 4.0000 mg | Freq: Four times a day (QID) | INTRAMUSCULAR | Status: DC | PRN

## 2017-03-20 MED ORDER — LOTEPREDNOL ETABONATE 0.5 % OP GEL: 1.0000 [drp] | Freq: Every day | OPHTHALMIC | Status: DC

## 2017-03-20 MED ORDER — SODIUM CHLORIDE 0.9% FLUSH
3.0000 mL | Freq: Two times a day (BID) | INTRAVENOUS | Status: DC
  Administered 2017-03-20 – 2017-03-22 (×5): 3 mL via INTRAVENOUS

## 2017-03-20 MED ORDER — DOCUSATE SODIUM 100 MG PO CAPS
100.0000 mg | ORAL_CAPSULE | Freq: Two times a day (BID) | ORAL | Status: DC
  Administered 2017-03-20: 100 mg via ORAL
  Filled 2017-03-20: qty 1

## 2017-03-20 MED ORDER — MORPHINE SULFATE (PF) 2 MG/ML IV SOLN
2.0000 mg | Freq: Once | INTRAVENOUS | Status: AC
  Administered 2017-03-20: 2 mg via INTRAVENOUS
  Filled 2017-03-20: qty 1

## 2017-03-20 MED ORDER — INSULIN ASPART 100 UNIT/ML ~~LOC~~ SOLN
0.0000 [IU] | Freq: Three times a day (TID) | SUBCUTANEOUS | Status: DC
  Administered 2017-03-20: 2 [IU] via SUBCUTANEOUS
  Administered 2017-03-20: 1 [IU] via SUBCUTANEOUS
  Administered 2017-03-21 – 2017-03-22 (×3): 2 [IU] via SUBCUTANEOUS

## 2017-03-20 MED ORDER — INSULIN ASPART 100 UNIT/ML ~~LOC~~ SOLN: 0.0000 [IU] | Freq: Every day | SUBCUTANEOUS | Status: DC

## 2017-03-20 MED ORDER — TIMOLOL MALEATE 0.5 % OP SOLN
1.0000 [drp] | Freq: Every day | OPHTHALMIC | Status: DC
  Administered 2017-03-20 – 2017-03-22 (×3): 1 [drp] via OPHTHALMIC
  Filled 2017-03-20: qty 5

## 2017-03-20 MED ORDER — SODIUM CHLORIDE 0.9 % IV SOLN: 250.0000 mL | INTRAVENOUS | Status: DC | PRN

## 2017-03-20 MED ORDER — POLYETHYLENE GLYCOL 3350 17 GM/SCOOP PO POWD: 17.0000 g | Freq: Every day | ORAL | Status: DC | PRN

## 2017-03-20 MED ORDER — FUROSEMIDE 10 MG/ML IJ SOLN
40.0000 mg | Freq: Two times a day (BID) | INTRAMUSCULAR | Status: DC
  Administered 2017-03-20 – 2017-03-21 (×3): 40 mg via INTRAVENOUS
  Filled 2017-03-20 (×3): qty 4

## 2017-03-20 MED ORDER — HYDROCORTISONE 2.5 % RE CREA: 1.0000 "application " | TOPICAL_CREAM | Freq: Two times a day (BID) | RECTAL | Status: DC

## 2017-03-20 MED ORDER — SODIUM CHLORIDE 0.9% FLUSH: 3.0000 mL | INTRAVENOUS | Status: DC | PRN

## 2017-03-20 MED ORDER — NITROGLYCERIN 2 % TD OINT
1.0000 [in_us] | TOPICAL_OINTMENT | Freq: Once | TRANSDERMAL | Status: AC
  Administered 2017-03-20: 1 [in_us] via TOPICAL
  Filled 2017-03-20: qty 1

## 2017-03-20 MED ORDER — ISOSORB DINITRATE-HYDRALAZINE 20-37.5 MG PO TABS
1.0000 | ORAL_TABLET | Freq: Two times a day (BID) | ORAL | Status: DC
  Administered 2017-03-20 – 2017-03-22 (×5): 1 via ORAL
  Filled 2017-03-20 (×5): qty 1

## 2017-03-20 MED ORDER — EZETIMIBE 10 MG PO TABS
5.0000 mg | ORAL_TABLET | Freq: Every day | ORAL | Status: DC
  Administered 2017-03-20 – 2017-03-21 (×2): 5 mg via ORAL
  Filled 2017-03-20 (×2): qty 1

## 2017-03-20 MED ORDER — FUROSEMIDE 10 MG/ML IJ SOLN
40.0000 mg | Freq: Once | INTRAMUSCULAR | Status: AC
  Administered 2017-03-20: 40 mg via INTRAVENOUS
  Filled 2017-03-20: qty 4

## 2017-03-20 MED ORDER — NITROGLYCERIN 0.4 MG SL SUBL: 0.4000 mg | SUBLINGUAL_TABLET | SUBLINGUAL | Status: DC | PRN

## 2017-03-20 MED ORDER — SIMVASTATIN 10 MG PO TABS
10.0000 mg | ORAL_TABLET | Freq: Every day | ORAL | Status: DC
  Administered 2017-03-20 – 2017-03-21 (×2): 10 mg via ORAL
  Filled 2017-03-20 (×2): qty 1

## 2017-03-20 NOTE — H&P (Addendum)
History and Physical    [name] ZOX:[id number] DOB: Feb 17, 1928 DOA: 03/19/2017  PCP: Alwyn Ren, MD   I have briefly reviewed patients previous medical reports in [facility].  Patient coming from: Home  Chief Complaint: Shortness of breath, increased lower extremity swelling.  HPI: [name] is a 83 year old female with a past medical history significant for hypertension, chronic renal disease stage III, combined systolic heart failure (ejection fraction 45-50%; last echo November 2018), dementia and type II diabetes; who presented to the emergency department secondary to increased shortness of breath and swelling of her lower extremities.  Patient reports that her symptoms have been present for the last 2-2.5 days and worsening.  She denies chest pain currently (but expressed some chest tightness sensation along with SOB), fever, chills, cough, any sputum production, hematuria, dysuria, melena, hematochezia or any other complaints. Of note, patient was admitted to the hospital approximately 1 week ago secondary to CHF exacerbation as well.  Be concerns for diet indiscretion and medication compliance given underlying cognitive impairment and current living situation/social support.  ED Course: Patient with elevated BNP, chest x-ray demonstrating vascular congestion or mild pulmonary edema and requiring oxygen supplementation.  On physical exam she was also found with swelling in her lower extremities and crackles on auscultation.  IV Lasix provided oxygen supplementation initiated and [facility] called to admit the patient for further evaluation and treatment.  Review of Systems:  All other systems reviewed and apart from HPI, are negative.  Past Medical History:  Diagnosis Date  . Anemia   . Anxiety disorder   . Carotid artery occlusion   . Carotid bruit   . Chronic kidney disease (CKD), stage III (moderate) (HCC)   . Coronary artery disease    Remote  PCI. Had BMS to RCA and DES stent to Diagonal in Jan. 2012   . Diastolic heart failure    EF 55 to 60% per cath in Jan 2012  . DVT (deep venous thrombosis) (HCC)   . Gout   . Hyperlipidemia   . Hypertension   . Ischemic cardiomyopathy    with prior EF of 35%  . NSTEMI (non-ST elevated myocardial infarction) [facility]) Jan 2012   with PCI to RCA and DX per Dr. Excell Seltzer  . Obesity   . Pneumonia 2016  . Proteinuria   . Stroke [facility]) 2013   ?  mini    . TIA (transient ischemic attack)   . Type II diabetes mellitus (HCC)    long standing    Past Surgical History:  Procedure Laterality Date  . ABDOMINAL AORTAGRAM N/A 01/17/2014   Procedure: ABDOMINAL Ronny Flurry;  Surgeon: Sherren Kerns, MD;  Location: [facility] CATH LAB;  Service: Cardiovascular;  Laterality: N/A;  . ANKLE FUSION Bilateral   . APPENDECTOMY    . CATARACT EXTRACTION, BILATERAL Bilateral   . CORONARY ANGIOPLASTY WITH STENT PLACEMENT  04/05/2010   distal RCA  . FRACTURE SURGERY Right 2005   bilateral ankles   . TONSILLECTOMY      Social History  reports that she has quit smoking. she has never used smokeless tobacco. She reports that she does not drink alcohol or use drugs.  Allergies  Allergen Reactions  . Namzaric [Memantine Hcl-Donepezil Hcl] Nausea Only and Other (See Comments)    confusion  . Percocet [Oxycodone-Acetaminophen] Other (See Comments)    loosy goosy  . Sulfa Antibiotics Other (See Comments)    Tongue swells  . Adhesive [Tape] Other (See Comments)  Tears skin.  Please use "paper" tape  . Metformin And Related Diarrhea and Other (See Comments)    Abnormal Kidney Functions   . Tradjenta [Linagliptin] Other (See Comments)    Low back ache, resolved once medication stopped   . Penicillins Hives and Other (See Comments)    Tolerates Ceftriaxone, Has patient had a PCN reaction causing immediate rash, facial/tongue/throat swelling, SOB or lightheadedness with hypotension: Unknown Has patient had a PCN  reaction causing severe rash involving mucus membranes or skin necrosis: No Has patient had a PCN reaction that required hospitalization No Has patient had a PCN reaction occurring within the last 10 years: No If all of the above answers are "NO", then may proceed with Cephalosporin use.     Family History  Problem Relation Age of Onset  . Diabetes Daughter   . CAD Neg Hx      Prior to Admission medications   Medication Sig Start Date End Date Taking? Authorizing Provider  aspirin EC 81 MG tablet Take 1 tablet (81 mg total) by mouth daily. 12/11/16   Albertine GratesXu, Fang, MD  BYSTOLIC 20 MG TABS TAKE 1 TABLET (20 MG TOTAL) BY MOUTH DAILY. FOR BLOOD PRESSURE 12/26/16   Sharon SellerEubanks, Jessica K, NP  cloNIDine (CATAPRES) 0.1 MG tablet Take 1 tablet (0.1 mg total) by mouth 2 (two) times daily. 02/09/17   Kathlen ModyAkula, Vijaya, MD  docusate sodium (COLACE) 100 MG capsule Take 1 capsule (100 mg total) by mouth every 12 (twelve) hours. 01/09/17   Ward, Layla MawKristen N, DO  ezetimibe-simvastatin (VYTORIN) 10-20 MG tablet TAKE 1/2 TABLET BY MOUTH ONCE NIGHTLY TO CONTROL CHOLESTEROL 01/19/17   Kirt Boysarter, Monica, DO  feeding supplement, ENSURE ENLIVE, (ENSURE ENLIVE) LIQD Take 237 mLs by mouth 2 (two) times daily between meals. 12/11/16   Albertine GratesXu, Fang, MD  furosemide (LASIX) 40 MG tablet Take 1 tablet (40 mg total) by mouth 2 (two) times daily. 03/13/17   Maxie BarbBhandari, Dron Prasad, MD  hydrocortisone (ANUSOL-HC) 2.5 % rectal cream Place 1 application rectally 3 (three) times daily. 02/09/17   Kathlen ModyAkula, Vijaya, MD  JANUVIA 100 MG tablet Take 1 tablet (100 mg total) by mouth daily. 02/16/17   Kirt Boysarter, Monica, DO  Loteprednol Etabonate (LOTEMAX) 0.5 % GEL Place 1 drop into the left eye daily. PLACE 1 DROP IN THE LEFT EYE TWICE DAILY 01/19/17   Kirt Boysarter, Monica, DO  nitroGLYCERIN (NITROSTAT) 0.4 MG SL tablet Place one under tongue for chest pain, up to 3 tablets 09/28/16   Kirt Boysarter, Monica, DO  polyethylene glycol powder (GLYCOLAX/MIRALAX) powder Take 17 g by  mouth daily as needed.  01/16/17   [provider]  PROCTOZONE-HC 2.5 % rectal cream Place 1 application 2 (two) times daily rectally.  01/16/17   [provider]  timolol (BETIMOL) 0.5 % ophthalmic solution INSTILL 1 DROP INTO THE LEFT EYE DAILY 09/28/16   Kirt Boysarter, Monica, DO    Physical Exam: Vitals:   03/20/17 0500 03/20/17 0530 03/20/17 0715 03/20/17 0800  BP: (!) 237/76 138/82 (!) 167/87 (!) 199/62  Pulse: 72 72 67 64  Resp: (!) 26 (!) 24 16 14   Temp:      TempSrc:      SpO2: 100% 98% 100% 100%  Weight:      Height:        Constitutional: Denying chest pain, headaches, nausea, vomiting, abdominal pain.  Patient is slightly short of breath and with broken speech during examination.  Patient is wearing 4 L nasal cannula oxygen supplementation. Eyes: PERTLA,  lids and conjunctivae normal, no icterus, no nystagmus ENMT: Mucous membranes are moist. Posterior pharynx clear of any exudate or lesions.  Neck: supple, no masses, no thyromegaly, mild JVD appreciated on exam. Respiratory: Decreased air movement at the bases bilaterally, scattered rhonchi positive crackles appreciated on exam.  No wheezing.  Mild tachypnea no using accessory muscles.  Patient wearing 4 L of nasal cannula.   Cardiovascular: S1 & S2 heard, regular rate and rhythm, no murmurs / rubs / gallops.  No carotid bruits.  Abdomen: No distension, no tenderness, no masses palpated. No hepatosplenomegaly. Bowel sounds normal.  Musculoskeletal: no clubbing / cyanosis. Normal muscle tone.  1+ edema bilaterally extending up to above her knees. Skin: no rashes, lesions, ulcers. No induration Neurologic: CN 2-12 grossly intact.  No acute focal neurologic deficit appreciated on exam.  Moving 4 limbs spontaneously and able to follow commands.  Psychiatric: Patient very repetitive about her statements, with poor overall insight of her condition.  Oriented to person place; normal mood and no demonstrating suicidal  ideation or hallucinations currently.    Labs on Admission: I have personally reviewed following labs and imaging studies  CBC: Recent Labs  Lab 03/19/17 2304 03/19/17 2313  WBC 4.1  --   NEUTROABS 2.8  --   HGB 9.2* 9.9*  HCT 28.7* 29.0*  MCV 94.7  --   PLT 256  --    Basic Metabolic Panel: Recent Labs  Lab 03/19/17 2304 03/19/17 2313  NA 142 145  K 4.7 4.6  CL 110 109  CO2 25  --   GLUCOSE 194* 182*  BUN 65* 58*  CREATININE 2.18* 2.00*  CALCIUM 8.2*  --    Liver Function Tests: Recent Labs  Lab 03/19/17 2304  AST 17  ALT 10*  ALKPHOS 52  BILITOT 0.4  PROT 6.1*  ALBUMIN 3.2*   Urine analysis:    Component Value Date/Time   COLORURINE YELLOW 03/19/2017 2253   APPEARANCEUR CLEAR 03/19/2017 2253   APPEARANCEUR Clear 02/21/2013 1622   LABSPEC 1.014 03/19/2017 2253   PHURINE 6.0 03/19/2017 2253   GLUCOSEU 50 (A) 03/19/2017 2253   HGBUR NEGATIVE 03/19/2017 2253   BILIRUBINUR NEGATIVE 03/19/2017 2253   BILIRUBINUR Negative 02/21/2013 1622   KETONESUR NEGATIVE 03/19/2017 2253   PROTEINUR 100 (A) 03/19/2017 2253   UROBILINOGEN 0.2 12/23/2014 1513   NITRITE NEGATIVE 03/19/2017 2253   LEUKOCYTESUR NEGATIVE 03/19/2017 2253   LEUKOCYTESUR Negative 02/21/2013 1622    Radiological Exams on Admission: Dg Chest Port 1 View  Result Date: 03/20/2017 CLINICAL DATA:  Acute onset of shortness of breath and generalized chest pain. EXAM: PORTABLE CHEST 1 VIEW COMPARISON:  Chest radiograph performed 03/09/2017 FINDINGS: The lungs are well-aerated. Vascular congestion is noted. Bilateral central airspace opacities raise concern for pulmonary edema, with mild left midlung atelectasis. No significant pleural effusion or pneumothorax is seen. The cardiomediastinal silhouette is borderline normal in size. No acute osseous abnormalities are seen. IMPRESSION: Vascular congestion. Bilateral central airspace opacities raise concern for pulmonary edema, with mild left midlung  atelectasis. Electronically Signed   By: Roanna RaiderJeffery  Chang M.D.   On: 03/20/2017 06:04    EKG:  No acute ischemic changes appreciated, normal axis, sinus rhythm.  Assessment/Plan 1-acute respiratory failure with hypoxia secondary to acute on chronic combined CHF exacerbation: -Patient with recent admission (approximately 1 week ago) due to CHF exacerbation as well. -Most likely having difficulties with being compliant with medication and diet indiscretion as a reason for exacerbation. -Most recent echo done  in November 2018 demonstrating ejection fraction 45-50% -Patient with increase crackles at the bases, lower extremity swelling and a chest x-ray 8 demonstrating vascular congestion and mild pulmonary edema. -will admit to telemetry bed follow daily weights, strict intake and output and low-sodium diet -Patient would be started on IV Lasix 40 mg twice a day, continue Bystolic and initiate treatment with Bidil. -no ACE or ARB given renal failure -Continue oxygen supplementation and wean off as tolerated. -follow volume status and clinical response  -Troponin negative and no complaining of chest pain.  EKG without ischemic changes.  2-Hyperlipidemia -Continue statins  3-Hypertension -Elevated, suggesting most likely lack of medication compliance. -Will be treated with Lasix, BiDil and continuation of bystolic  -Follow vital signs and further adjust antihypertensive regimen as needed.  4-Diabetes mellitus with nephropathy (HCC) -We will check A1c -Holding oral hypoglycemic regimen while inpatient -will use SSI  5-acute on chronic kidney disease: Patient with stage III at baseline, with creatinine of 1.9 -Creatinine on admission 2.18 -Most likely secondary to decreased perfusion in the setting of acute heart failure exacerbation. -Close follow-up during acute diuresis -Minimize nephrotoxic agents. -Urinalysis no suggesting acute infection.  6-Dementia without behavioral  disturbance -Continue supportive care -Patient without behavioral disturbances at this moment -At high risk for sundowning and confusion.  7-Medication noncompliance due to cognitive impairment -Case Production designer, theatre/television/film and social worker consulted.  8-left eye Glaucoma -continue timolol and lotemax.  9-Rectal prolapse/hemorrhoids -Stable overall -Continue the use of Proctofoam and Anusol -Patient with schedule appointment to follow with colorectal  surgeon on January 09/2017.    Time: 70 minutes   DVT prophylaxis: Heparin Code Status: Patient changed her mind and wants to be a Full Code Family Communication: No family at bedside. Disposition Plan: To be determined; physical therapy and social worker has been consulted.  Patient expressed feeling neglected and no sure she can continue taking care of herself at home. Consults called: None Admission status: Inpatient, telemetry bed, length of stay anticipated to be more than 2 midnights.   Vassie Loll MD [facility] Pager [phone]  If 7PM-7AM, please contact night-coverage [email or web address] Password [facility]  03/20/2017, 9:31 AM

## 2017-03-20 NOTE — ED Provider Notes (Signed)
[facility]-EMERGENCY DEPT Provider Note   CSN: [id number] Arrival date & time: 03/19/17  2144     History   Chief Complaint Chief Complaint  Patient presents with  . Hypertension  . Leg Swelling    bil. legs/foot    HPI Ashley RheinDoris E Walters is a 83 y.o. female.  This patient is an [age] year old female with past mental history of hypertension, chronic renal insufficiency, cardiomyopathy, CHF. She presents today for evaluation of shortness of breath and chest tightness. She also reports swelling in her legs. This began yesterday and is worsening. She was recently admitted for an exacerbation of CHF with a similar presentation. She denies fevers or chills. She denies productive cough.   The history is provided by the patient.  Shortness of Breath  This is a new problem. The average episode lasts 2 days. The problem occurs continuously.The problem has been gradually worsening. Associated symptoms include chest pain. Pertinent negatives include no fever, no cough and no sputum production. She has tried nothing for the symptoms. The treatment provided no relief. She has had prior hospitalizations.    Past Medical History:  Diagnosis Date  . Anemia   . Anxiety disorder   . Carotid artery occlusion   . Carotid bruit   . Chronic kidney disease (CKD), stage III (moderate) (HCC)   . Coronary artery disease    Remote PCI. Had BMS to RCA and DES stent to Diagonal in Jan. 2012   . Diastolic heart failure    EF 55 to 60% per cath in Jan 2012  . DVT (deep venous thrombosis) (HCC)   . Gout   . Hyperlipidemia   . Hypertension   . Ischemic cardiomyopathy    with prior EF of 35%  . NSTEMI (non-ST elevated myocardial infarction) [facility] Jan 2012   with PCI to RCA and DX per Dr. Excell Seltzerooper  . Obesity   . Pneumonia 2016  . Proteinuria   . Stroke [facility](HCC) 2013   ?  mini    . TIA (transient ischemic attack)   . Type II diabetes mellitus (HCC)    long standing    Patient Active  Problem List   Diagnosis Date Noted  . CHF (congestive heart failure) (HCC) 03/09/2017  . Acute kidney injury superimposed on CKD (HCC)stage III 02/07/2017  . Acute on chronic diastolic CHF (congestive heart failure) (HCC) 02/05/2017  . Urinary tract infection without hematuria   . Hypoxia 02/04/2017  . Abnormal CXR 02/04/2017  . Accelerated hypertension 02/04/2017  . LBBB (left bundle branch block) 02/04/2017  . Diarrhea 12/09/2016  . Delusions (HCC) 09/28/2016  . Medication noncompliance due to cognitive impairment 09/28/2016  . Hemorrhoids 05/04/2016  . Pain in joint, lower leg 01/20/2016  . Ptosis 11/04/2015  . Glaucoma 08/25/2015  . Dementia without behavioral disturbance 06/10/2015  . Expressive aphasia 05/26/2015  . Rectal prolapse 02/25/2015  . CKD (chronic kidney disease), stage III (HCC) 02/10/2014  . PAD (peripheral artery disease) (HCC) 01/17/2014  . Constipation 07/31/2013  . Atherosclerosis of native artery of extremity with intermittent claudication (HCC) 07/04/2013  . Anxiety disorder 02/21/2013  . Diabetes mellitus with nephropathy (HCC) 07/05/2012  . Occlusion and stenosis of carotid artery without mention of cerebral infarction 06/21/2012  . Coronary artery disease   . Chronic diastolic heart failure, NYHA class 1 (HCC)   . Hyperlipidemia   . Hypertension   . Carotid bruit   . Gout   . Obesity   . TIA (transient ischemic attack)  Past Surgical History:  Procedure Laterality Date  . ABDOMINAL AORTAGRAM N/A 01/17/2014   Procedure: ABDOMINAL Ronny Flurry;  Surgeon: Sherren Kerns, MD;  Location: [facility] CATH LAB;  Service: Cardiovascular;  Laterality: N/A;  . ANKLE FUSION Bilateral   . APPENDECTOMY    . CATARACT EXTRACTION, BILATERAL Bilateral   . CORONARY ANGIOPLASTY WITH STENT PLACEMENT  04/05/2010   distal RCA  . FRACTURE SURGERY Right 2005   bilateral ankles   . TONSILLECTOMY      OB History    No data available       Home Medications     Prior to Admission medications   Medication Sig Start Date End Date Taking? Authorizing Provider  aspirin EC 81 MG tablet Take 1 tablet (81 mg total) by mouth daily. 12/11/16   Albertine Grates, MD  BYSTOLIC 20 MG TABS TAKE 1 TABLET (20 MG TOTAL) BY MOUTH DAILY. FOR BLOOD PRESSURE 12/26/16   Sharon Seller, NP  cloNIDine (CATAPRES) 0.1 MG tablet Take 1 tablet (0.1 mg total) by mouth 2 (two) times daily. 02/09/17   Kathlen Mody, MD  docusate sodium (COLACE) 100 MG capsule Take 1 capsule (100 mg total) by mouth every 12 (twelve) hours. 01/09/17   Ward, Layla Maw, DO  ezetimibe-simvastatin (VYTORIN) 10-20 MG tablet TAKE 1/2 TABLET BY MOUTH ONCE NIGHTLY TO CONTROL CHOLESTEROL 01/19/17   Kirt Boys, DO  feeding supplement, ENSURE ENLIVE, (ENSURE ENLIVE) LIQD Take 237 mLs by mouth 2 (two) times daily between meals. 12/11/16   Albertine Grates, MD  furosemide (LASIX) 40 MG tablet Take 1 tablet (40 mg total) by mouth 2 (two) times daily. 03/13/17   Maxie Barb, MD  hydrocortisone (ANUSOL-HC) 2.5 % rectal cream Place 1 application rectally 3 (three) times daily. 02/09/17   Kathlen Mody, MD  JANUVIA 100 MG tablet Take 1 tablet (100 mg total) by mouth daily. 02/16/17   Kirt Boys, DO  Loteprednol Etabonate (LOTEMAX) 0.5 % GEL Place 1 drop into the left eye daily. PLACE 1 DROP IN THE LEFT EYE TWICE DAILY 01/19/17   Kirt Boys, DO  nitroGLYCERIN (NITROSTAT) 0.4 MG SL tablet Place one under tongue for chest pain, up to 3 tablets 09/28/16   Kirt Boys, DO  polyethylene glycol powder (GLYCOLAX/MIRALAX) powder Take 17 g by mouth daily as needed.  01/16/17   [provider]  PROCTOZONE-HC 2.5 % rectal cream Place 1 application 2 (two) times daily rectally.  01/16/17   [provider]  timolol (BETIMOL) 0.5 % ophthalmic solution INSTILL 1 DROP INTO THE LEFT EYE DAILY 09/28/16   Kirt Boys, DO    Family History Family History  Problem Relation Age of Onset  . Diabetes Daughter    . CAD Neg Hx     Social History Social History   Tobacco Use  . Smoking status: Former Games developer  . Smokeless tobacco: Never Used  . Tobacco comment: "smoked in my teens; quit after 1 year or so"  Substance Use Topics  . Alcohol use: No    Alcohol/week: 0.0 oz  . Drug use: No     Allergies   Namzaric [memantine hcl-donepezil hcl]; Percocet [oxycodone-acetaminophen]; Sulfa antibiotics; Adhesive [tape]; Metformin and related; Tradjenta [linagliptin]; and Penicillins   Review of Systems Review of Systems  Constitutional: Negative for fever.  Respiratory: Positive for shortness of breath. Negative for cough and sputum production.   Cardiovascular: Positive for chest pain.  All other systems reviewed and are negative.    Physical Exam Updated Vital Signs  BP (!) 184/70   Pulse 69   Temp 98.2 F (36.8 C) (Oral)   Resp (!) 22   Ht 5' (1.524 m)   Wt 59 kg (130 lb)   SpO2 99%   BMI 25.39 kg/m   Physical Exam  Constitutional: She is oriented to person, place, and time. She appears well-developed and well-nourished. No distress.  HENT:  Head: Normocephalic and atraumatic.  Mouth/Throat: Oropharynx is clear and moist.  Neck: Normal range of motion. Neck supple.  Cardiovascular: Normal rate and regular rhythm. Exam reveals no gallop and no friction rub.  No murmur heard. Pulmonary/Chest: Effort normal. No respiratory distress. She has no wheezes. She has rales.  There are rales in both bases.  Abdominal: Soft. Bowel sounds are normal. She exhibits no distension. There is no tenderness.  Musculoskeletal: Normal range of motion. She exhibits edema.  There is 2+ pitting edema both lower extremities.  Neurological: She is alert and oriented to person, place, and time.  Skin: Skin is warm and dry. She is not diaphoretic.  Nursing note and vitals reviewed.    ED Treatments / Results  Labs (all labs ordered are listed, but only abnormal results are displayed) Labs Reviewed   I-STAT CHEM 8, ED - Abnormal; Notable for the following components:      Result Value   BUN 58 (*)    Creatinine, Ser 2.00 (*)    Glucose, Bld 182 (*)    Calcium, Ion 1.13 (*)    Hemoglobin 9.9 (*)    HCT 29.0 (*)    All other components within normal limits  URINALYSIS, ROUTINE W REFLEX MICROSCOPIC  COMPREHENSIVE METABOLIC PANEL  BRAIN NATRIURETIC PEPTIDE  CBC WITH DIFFERENTIAL/PLATELET  I-STAT TROPONIN, ED    EKG  EKG Interpretation  Date/Time:  Monday March 20 2017 05:48:25 EST Ventricular Rate:  78 PR Interval:    QRS Duration: 169 QT Interval:  472 QTC Calculation: 558 R Axis:   -57 Text Interpretation:  Sinus rhythm Atrial premature complex Left bundle branch block Baseline wander in lead(s) V6 Confirmed by Geoffery LyonseLo, Kashius Dominic ([id number]) on 03/20/2017 6:29:34 AM       Radiology No results found.  Procedures Procedures (including critical care time)  Medications Ordered in ED Medications - No data to display   Initial Impression / Assessment and Plan / ED Course  I have reviewed the triage vital signs and the nursing notes.  Pertinent labs & imaging results that were available during my care of the patient were reviewed by me and considered in my medical decision making (see chart for details).  Patient presents with shortness of breath which appears to be related to an exacerbation of CHF. She has an elevated BNP along with chest x-ray showing pulmonary edema. She was given IV Lasix. She was markedly hypertensive on arrival and was also medicated with nitroglycerin paste. Her blood pressure responded nicely.  She will be admitted to the hospitalist service for further workup and treatment. Dr. Julian ReilGardner agrees to admit.  Final Clinical Impressions(s) / ED Diagnoses   Final diagnoses:  None    ED Discharge Orders    None       Geoffery Lyonselo, Carlisle Enke, MD 03/20/17 0630

## 2017-03-20 NOTE — ED Notes (Signed)
Patient trying to give urine sample currently.

## 2017-03-20 NOTE — ED Notes (Signed)
Patient cannot urinate for urine sample.

## 2017-03-20 NOTE — Evaluation (Signed)
Physical Therapy Evaluation Patient Details Name: Ashley RheinDoris E Walters MRN: [id number] DOB: 1927/05/27 Today's Date: 03/20/2017   History of Present Illness  Pt is an 83 year old female with PMHx of DM, HTN, dementia, CKD IV, CAD, LBBB, chronic diastolic CHF, DVT, PAD and admitted for acute respiratory failure with hypoxia secondary to acute on chronic combined CHF exacerbation  Clinical Impression  Pt admitted with above diagnosis. Pt currently with functional limitations due to the deficits listed below (see PT Problem List).  Pt will benefit from skilled PT to increase their independence and safety with mobility to allow discharge to the venue listed below.   Pt assisted with ambulating in hallway and very happy to sit up in recliner end of session.  Pt requiring supplemental oxygen at this time.  Pt reports being very independent typically however recognizes needing more help lately although reluctant to ask for assistance.  Per chart, possible d/c to SNF.  If not SNF, would recommend supervision for mobility and HHPT.  SATURATION QUALIFICATIONS: (This note is used to comply with regulatory documentation for home oxygen)  Patient Saturations on Room Air at Rest = 95%  Patient Saturations on Room Air while Ambulating = 85%  Patient Saturations on 2 Liters of oxygen while Ambulating = 92%  Please briefly explain why patient needs home oxygen: to improve oxygen saturations with physical activity such as ambulation.     Follow Up Recommendations Supervision for mobility/OOB;SNF    Equipment Recommendations  None recommended by PT    Recommendations for Other Services       Precautions / Restrictions Precautions Precautions: Fall Precaution Comments: monitor sats Restrictions Weight Bearing Restrictions: No      Mobility  Bed Mobility Overal bed mobility: Modified Independent             General bed mobility comments: HOB elevated  Transfers Overall transfer level: Needs  assistance Equipment used: None Transfers: Sit to/from Stand Sit to Stand: Min guard         General transfer comment: min/guard for safety  Ambulation/Gait Ambulation/Gait assistance: Min guard Ambulation Distance (Feet): 60 Feet Assistive device: None(hand rail upon return to room) Gait Pattern/deviations: Step-through pattern;Decreased stride length;Trunk flexed     General Gait Details: pt reports she should use a RW however likes to challenge herself so doesn't normally use an assistive device, denies falls, mildly unsteady gait however no true LOB, attempted to push O2 tank however recommended she hold hand rail for more support; required supplemental oxygen  Stairs            Wheelchair Mobility    Modified Rankin (Stroke Patients Only)       Balance Overall balance assessment: Needs assistance         Standing balance support: No upper extremity supported Standing balance-Leahy Scale: Good                               Pertinent Vitals/Pain Pain Assessment: No/denies pain    Home Living Family/patient expects to be discharged to:: Private residence Living Arrangements: Children Available Help at Discharge: Available 24 hours/day;Family Type of Home: House Home Access: Stairs to enter Entrance Stairs-Rails: Can reach both Entrance Stairs-Number of Steps: 3 Home Layout: One level Home Equipment: Walker - 2 wheels;Bedside commode;Wheelchair - Lawyermanual;Shower seat Additional Comments: pt is a retired Charity fundraiserN; 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 and assist as needed    Prior  Function Level of Independence: Independent         Comments: pt reports she is very independent, does not like to ask for help; states daughter who lives with her is not very helpful anyway, denies using RW although states she probably should     Hand Dominance        Extremity/Trunk Assessment        Lower Extremity Assessment Lower Extremity  Assessment: Generalized weakness    Cervical / Trunk Assessment Cervical / Trunk Assessment: Kyphotic  Communication   Communication: HOH  Cognition Arousal/Alertness: Awake/alert Behavior During Therapy: WFL for tasks assessed/performed Overall Cognitive Status: Within Functional Limits for tasks assessed                                        General Comments      Exercises     Assessment/Plan    PT Assessment Patient needs continued PT services  PT Problem List Cardiopulmonary status limiting activity;Decreased mobility;Decreased activity tolerance;Decreased balance;Decreased knowledge of use of DME       PT Treatment Interventions Gait training;Therapeutic activities;DME instruction;Therapeutic exercise;Functional mobility training;Balance training;Patient/family education    PT Goals (Current goals can be found in the Care Plan section)  Acute Rehab PT Goals PT Goal Formulation: With patient/family Time For Goal Achievement: 04/03/17 Potential to Achieve Goals: Good    Frequency Min 3X/week   Barriers to discharge        Co-evaluation               AM-PAC PT "6 Clicks" Daily Activity  Outcome Measure Difficulty turning over in bed (including adjusting bedclothes, sheets and blankets)?: None Difficulty moving from lying on back to sitting on the side of the bed? : A Little Difficulty sitting down on and standing up from a chair with arms (e.g., wheelchair, bedside commode, etc,.)?: A Little Help needed moving to and from a bed to chair (including a wheelchair)?: A Little Help needed walking in hospital room?: A Little Help needed climbing 3-5 steps with a railing? : A Lot 6 Click Score: 18    End of Session Equipment Utilized During Treatment: Gait belt;Oxygen Activity Tolerance: Patient tolerated treatment well Patient left: in chair;with call bell/phone within reach;with chair alarm set Nurse Communication: Mobility status PT Visit  Diagnosis: Difficulty in walking, not elsewhere classified (R26.2)    Time: [id number] PT Time Calculation (min) (ACUTE ONLY): 20 min   Charges:   PT Evaluation $PT Eval Low Complexity: 1 Low     PT G CodesZenovia Jarred:        Kati Nehal Shives, PT, DPT 03/20/2017 Pager: [phone]   Maida SaleLEMYRE,KATHrine E 03/20/2017, 4:01 PM

## 2017-03-21 DIAGNOSIS — L8990 Pressure ulcer of unspecified site, unspecified stage: Secondary | ICD-10-CM

## 2017-03-21 LAB — BASIC METABOLIC PANEL
ANION GAP: 6 (ref 5–15)
BUN: 64 mg/dL — ABNORMAL HIGH (ref 6–20)
CALCIUM: 8.3 mg/dL — AB (ref 8.9–10.3)
CHLORIDE: 109 mmol/L (ref 101–111)
CO2: 26 mmol/L (ref 22–32)
Creatinine, Ser: 2.12 mg/dL — ABNORMAL HIGH (ref 0.44–1.00)
GFR calc non Af Amer: 20 mL/min — ABNORMAL LOW (ref >60)
GFR, EST AFRICAN AMERICAN: 23 mL/min — AB (ref >60)
GLUCOSE: 113 mg/dL — AB (ref 65–99)
POTASSIUM: 4.8 mmol/L (ref 3.5–5.1)
Sodium: 141 mmol/L (ref 135–145)

## 2017-03-21 LAB — GLUCOSE, CAPILLARY
GLUCOSE-CAPILLARY: 154 mg/dL — AB (ref 65–99)
GLUCOSE-CAPILLARY: 155 mg/dL — AB (ref 65–99)
GLUCOSE-CAPILLARY: 77 mg/dL (ref 65–99)
Glucose-Capillary: 117 mg/dL — ABNORMAL HIGH (ref 65–99)

## 2017-03-21 MED ORDER — FUROSEMIDE 20 MG PO TABS
20.0000 mg | ORAL_TABLET | Freq: Two times a day (BID) | ORAL | Status: DC
  Administered 2017-03-21 – 2017-03-22 (×2): 20 mg via ORAL
  Filled 2017-03-21 (×2): qty 1

## 2017-03-21 MED ORDER — HALOPERIDOL LACTATE 5 MG/ML IJ SOLN: 1.0000 mg | Freq: Four times a day (QID) | INTRAMUSCULAR | Status: DC | PRN

## 2017-03-21 NOTE — Clinical Social Work Note (Signed)
Clinical Social Work Assessment  Patient Details  Name: [name] MRN: [id number] Date of Birth: 01/13/1928  Date of referral:  03/21/17               Reason for consult:  ( feeling negleted at home and unsure if able to look after herself with current living situation.)                Permission sought to share information with:  Family Supports Permission granted to share information::     Name::        Agency::     Relationship::  husband, daughters  Contact Information:     Housing/Transportation Living arrangements for the past 2 months:  Single Family Home Source of Information:  Patient, Spouse Patient Interpreter Needed:  None Criminal Activity/Legal Involvement Pertinent to Current Situation/Hospitalization:  No - Comment as needed Significant Relationships:  Adult Children, Spouse, Friend Lives with:  Self, Spouse Do you feel safe going back to the place where you live?  Yes Need for family participation in patient care:  No (Coment)  Care giving concerns:  Pt from home where she resides with her husband and daughter. She states "I have all I need there," no caregiving concerns reported. She states at home she ambulates alone or with their help, has bedside commode and denies any other needs.   Social Worker assessment / plan:  CSW received consult for "feeling negleted at home and unsure if able to look after herself with current living situation." CSW met with pt at bedside today. She reports being well cared for at home and denies any CSW intervention needs.  CSW also noted that PT evaluation recommended supervision/SNF. Pt states her husband and daughter will be with her 24/7 at home. She states she has had HHPT in the past and would like to pursue that again. (Unsure which agency she used but states she was unhappy with the therapist due to "attitude." States she also used to have [facility] in community but felt she was well-set-up with resources and does not have [facility]  anymore. 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   Plan: Pt and husband (at bedside at end of assessment) request referral for [facility] and state they are planning to care for pt at home at [place].   Employment status:  Retired Nurse, adult PT Recommendations:  [place], [place] / Referral to community resources:     Patient/Family's Response to care:  Very engaged and appreciative  Patient/Family's Understanding of and Emotional Response to Diagnosis, Current Treatment, and Prognosis:  Pt demonstrates good understanding and reports familiarity with topics of assessment due to her having been Therapist, sports for her career. Husband participated at end of assessment and seemed emotionally well-adjusted and pleasant.   Emotional Assessment Appearance:  Appears stated age Attitude/Demeanor/Rapport:  (pleasant) Affect (typically observed):  Accepting, Calm, Hopeful Orientation:  Oriented to Self, Oriented to Place, Oriented to  Time, Oriented to Situation Alcohol / Substance use:  Not Applicable Psych involvement (Current and /or in the community):  No (Comment)  Discharge Needs  Concerns to be addressed:  Discharge Planning Concerns Readmission within the last 30 days:  Yes Current discharge risk:  None Barriers to Discharge:  Continued Medical Work up   [employer], LCSW 03/21/2017, 10:46  AM  [phone]

## 2017-03-21 NOTE — Progress Notes (Signed)
[facility] PROGRESS NOTE    Progress Note  [name]  ZOX:[id number] DOB: 31-Mar-1927 DOA: 03/19/2017 PCP: Ashley Ren, MD     Brief Narrative:   [name] is an 84 y.o. female past medical history significant for essential hypertension, chronic kidney disease stage III, systolic heart failure with an EF of 45%, dementia diabetes mellitus type 2 who presents with increased shortness of breath and lower extremity swelling, in the ED she was found to have an elevated BNP with a chest x-ray with showing pulmonary congestion  Assessment/Plan:   Acute respiratory failure with hypoxia secondary to acute decompensated systolic and diastolic heart failure: Patient recently discharged a week ago, for similar symptoms. Likely due to noncompliance. Continue beta-blockers and BiDil, as he is not a candidate for ACE inhibitor due to renal failure. Fluid restriction and daily weights.  Cardiac biomarkers negative he denies any chest pain. She seems to be close to estimated dry weight of 55 kg. Change Lasix to oral monitor overnight check a basic metabolic panel in the morning.  Hyperlipidemia: Continue statins.  Essential hypertension Elevated likely due to medication noncompliance, continue IV Lasix, BiDil and bystolic.  Chronic kidney disease stage III: Baseline creatinine of 2.1 avoid nephrotoxic drug. Continue IV diuresis.  Dementia without behavioral disturbances: Continue supportive care, at risk of sundowning, Haldol IV as needed for agitation.  Rectal prolapse/hemorrhoids: Continue current medication.  Diabetes mellitus with nephropathy (HCC) A1c is pending continue to hold oral hypoglycemic agents, blood glucose is fairly controlled continue sliding scale insulin.    DVT prophylaxis: lovenox Family Communication:Daughter Disposition Plan/Barrier to D/C: home in am Code Status:     Code Status Orders  (From admission, onward)        Start      Ordered   03/20/17 1206  Full code  Continuous     03/20/17 1205    Code Status History    Date Active Date Inactive Code Status Order ID Comments User Context   03/20/2017 09:27 03/20/2017 12:05 DNR [id number]  Vassie Loll, MD ED   03/09/2017 15:09 03/13/2017 17:30 DNR [id number]  Ashley Ren, MD ED   02/05/2017 01:23 02/09/2017 17:00 Full Code [id number]  Ashley Doyne, MD ED   12/09/2016 03:00 12/11/2016 16:13 DNR [id number]  Ashley Clos, MD ED   06/12/2015 17:41 06/15/2015 17:44 DNR [id number]  Ashley Pel, NP ED   04/02/2015 16:54 04/06/2015 20:21 Full Code [id number]  Russella Dar, NP Inpatient   06/03/2014 23:39 06/09/2014 15:00 Full Code [id number]  Hillary Bow, DO ED   01/17/2014 19:25 01/19/2014 16:35 Full Code [id number]  Lars Mage, PA-C Inpatient        IV Access:    Peripheral IV   Procedures and diagnostic studies:   Dg Chest Port 1 View  Result Date: 03/20/2017 CLINICAL DATA:  Acute onset of shortness of breath and generalized chest pain. EXAM: PORTABLE CHEST 1 VIEW COMPARISON:  Chest radiograph performed 03/09/2017 FINDINGS: The lungs are well-aerated. Vascular congestion is noted. Bilateral central airspace opacities raise concern for pulmonary edema, with mild left midlung atelectasis. No significant pleural effusion or pneumothorax is seen. The cardiomediastinal silhouette is borderline normal in size. No acute osseous abnormalities are seen. IMPRESSION: Vascular congestion. Bilateral central airspace opacities raise concern for pulmonary edema, with mild left midlung atelectasis. Electronically Signed   By: Roanna Raider M.D.   On: 03/20/2017 06:04     Medical Consultants:    None.  Anti-Infectives:  None  Subjective:    [name] she relates her breathing is better.  Objective:    Vitals:   03/20/17 1003 03/20/17 1100 03/20/17 2013 03/21/17 0451  BP: (!) 150/59 (!) 187/56 (!) 165/41 (!) 152/46  Pulse: (!)  58 61 62 (!) 57  Resp: 16 16 18 18   Temp:  98.4 F (36.9 C) 98.3 F (36.8 C) 97.7 F (36.5 C)  TempSrc:  Oral Oral Axillary  SpO2: 99% 99% 96% 94%  Weight:  56 kg (123 lb 7.3 oz)  55.6 kg (122 lb 9.2 oz)  Height:  5' (1.524 m)      Intake/Output Summary (Last 24 hours) at 03/21/2017 0834 Last data filed at 03/20/2017 2051 Gross per 24 hour  Intake 3 ml  Output 750 ml  Net -747 ml   Filed Weights   03/19/17 2200 03/20/17 1100 03/21/17 0451  Weight: 59 kg (130 lb) 56 kg (123 lb 7.3 oz) 55.6 kg (122 lb 9.2 oz)    Exam: General exam: In no acute distress. Respiratory system: Good air movement and clear to auscultation. Cardiovascular system: S1 & S2 heard, RRR.  Negative JVD. Gastrointestinal system: Abdomen is nondistended, soft and nontender.  Central nervous system: Alert and oriented. No focal neurological deficits. Extremities: No pedal edema. Skin: No rashes, lesions or ulcers   Data Reviewed:    Labs: Basic Metabolic Panel: Recent Labs  Lab 03/19/17 2304 03/19/17 2313 03/20/17 1410 03/21/17 0532  NA 142 145  --  141  K 4.7 4.6  --  4.8  CL 110 109  --  109  CO2 25  --   --  26  GLUCOSE 194* 182*  --  113*  BUN 65* 58*  --  64*  CREATININE 2.18* 2.00* 1.96* 2.12*  CALCIUM 8.2*  --   --  8.3*  MG  --   --  2.3  --    GFR Estimated Creatinine Clearance: 14.6 mL/min (A) (by C-G formula based on SCr of 2.12 mg/dL (H)). Liver Function Tests: Recent Labs  Lab 03/19/17 2304  AST 17  ALT 10*  ALKPHOS 52  BILITOT 0.4  PROT 6.1*  ALBUMIN 3.2*   No results for input(s): LIPASE, AMYLASE in the last 168 hours. No results for input(s): AMMONIA in the last 168 hours. Coagulation profile No results for input(s): INR, PROTIME in the last 168 hours.  CBC: Recent Labs  Lab 03/19/17 2304 03/19/17 2313 03/20/17 1410  WBC 4.1  --  4.2  NEUTROABS 2.8  --   --   HGB 9.2* 9.9* 9.3*  HCT 28.7* 29.0* 29.0*  MCV 94.7  --  95.1  PLT 256  --  245   Cardiac  Enzymes: No results for input(s): CKTOTAL, CKMB, CKMBINDEX, TROPONINI in the last 168 hours. BNP (last 3 results) No results for input(s): PROBNP in the last 8760 hours. CBG: Recent Labs  Lab 03/20/17 1141 03/20/17 1634 03/20/17 2041 03/21/17 0754  GLUCAP 139* 157* 176* 117*   D-Dimer: No results for input(s): DDIMER in the last 72 hours. Hgb A1c: Recent Labs    03/20/17 1410  HGBA1C 6.5*   Lipid Profile: No results for input(s): CHOL, HDL, LDLCALC, TRIG, CHOLHDL, LDLDIRECT in the last 72 hours. Thyroid function studies: Recent Labs    03/20/17 1410  TSH 3.950   Anemia work up: No results for input(s): VITAMINB12, FOLATE, FERRITIN, TIBC, IRON, RETICCTPCT in the last 72 hours. Sepsis Labs: Recent Labs  Lab 03/19/17 2304 03/20/17  1410  WBC 4.1 4.2   Microbiology No results found for this or any previous visit (from the past 240 hour(s)).   Medications:   . aspirin EC  81 mg Oral Daily  . docusate sodium  100 mg Oral Q12H  . ezetimibe  5 mg Oral QHS   And  . simvastatin  10 mg Oral QHS  . feeding supplement (ENSURE ENLIVE)  237 mL Oral BID BM  . furosemide  40 mg Intravenous BID  . heparin  5,000 Units Subcutaneous Q8H  . hydrocortisone  1 application Rectal TID  . insulin aspart  0-5 Units Subcutaneous QHS  . insulin aspart  0-9 Units Subcutaneous TID WC  . isosorbide-hydrALAZINE  1 tablet Oral BID  . loteprednol  1 drop Left Eye BID  . nebivolol  20 mg Oral Daily  . sodium chloride flush  3 mL Intravenous Q12H  . timolol  1 drop Left Eye Daily   Continuous Infusions: . sodium chloride        LOS: 1 day   Marinda Elk  [facility] Pager [phone]  *Please refer to [email or web address], password [email or web address] to get updated schedule on who will round on this patient, as hospitalists switch teams weekly. If 7PM-7AM, please contact night-coverage at [email or web address], password [email or web address] for any overnight needs.  03/21/2017, 8:34 AM

## 2017-03-22 DIAGNOSIS — J9601 Acute respiratory failure with hypoxia: Secondary | ICD-10-CM

## 2017-03-22 DIAGNOSIS — J9602 Acute respiratory failure with hypercapnia: Secondary | ICD-10-CM

## 2017-03-22 LAB — BASIC METABOLIC PANEL
ANION GAP: 10 (ref 5–15)
BUN: 70 mg/dL — AB (ref 6–20)
CO2: 27 mmol/L (ref 22–32)
Calcium: 8.5 mg/dL — ABNORMAL LOW (ref 8.9–10.3)
Chloride: 106 mmol/L (ref 101–111)
Creatinine, Ser: 2.29 mg/dL — ABNORMAL HIGH (ref 0.44–1.00)
GFR calc Af Amer: 21 mL/min — ABNORMAL LOW (ref >60)
GFR calc non Af Amer: 18 mL/min — ABNORMAL LOW (ref >60)
GLUCOSE: 177 mg/dL — AB (ref 65–99)
POTASSIUM: 4.4 mmol/L (ref 3.5–5.1)
Sodium: 143 mmol/L (ref 135–145)

## 2017-03-22 LAB — GLUCOSE, CAPILLARY: Glucose-Capillary: 160 mg/dL — ABNORMAL HIGH (ref 65–99)

## 2017-03-22 MED ORDER — NEBIVOLOL HCL 20 MG PO TABS: 20.0000 mg | ORAL_TABLET | Freq: Every day | ORAL | 2 refills | Status: DC

## 2017-03-22 MED ORDER — CLONIDINE HCL 0.1 MG PO TABS: 0.1000 mg | ORAL_TABLET | Freq: Two times a day (BID) | ORAL | 11 refills | Status: AC

## 2017-03-22 MED ORDER — ISOSORB DINITRATE-HYDRALAZINE 20-37.5 MG PO TABS: 1.0000 | ORAL_TABLET | Freq: Two times a day (BID) | ORAL | 0 refills | Status: DC

## 2017-03-22 MED ORDER — FUROSEMIDE 20 MG PO TABS: 20.0000 mg | ORAL_TABLET | Freq: Two times a day (BID) | ORAL | 0 refills | Status: DC

## 2017-03-22 NOTE — Progress Notes (Signed)
Physical Therapy Treatment Patient Details Name: [name] MRN: [id number] DOB: 02-02-1928 Today's Date: 03/22/2017    History of Present Illness Pt is an 84 year old female with PMHx of DM, HTN, dementia, CKD IV, CAD, LBBB, chronic diastolic CHF, DVT, PAD and admitted for acute respiratory failure with hypoxia secondary to acute on chronic combined CHF exacerbation    PT Comments    Pt reports fatigue and weakness today however wished to ambulate to/from bathroom.  SpO2 dropped to 86% on room air so reapplied 2L O2 [name].  Follow Up Recommendations  Supervision for mobility/OOB;SNF(pt declines SNF, prefers home)     Equipment Recommendations  None recommended by PT    Recommendations for Other Services       Precautions / Restrictions Precautions Precautions: Fall Precaution Comments: monitor sats    Mobility  Bed Mobility Overal bed mobility: Modified Independent             General bed mobility comments: HOB elevated  Transfers Overall transfer level: Needs assistance Equipment used: Rolling walker (2 wheeled) Transfers: Sit to/from Stand Sit to Stand: Min guard         General transfer comment: min/guard for safety  Ambulation/Gait Ambulation/Gait assistance: Min guard Ambulation Distance (Feet): 20 Feet Assistive device: Rolling walker (2 wheeled) Gait Pattern/deviations: Step-through pattern;Decreased stride length;Trunk flexed     General Gait Details: utilized RW as pt reports fatigue and weakness today, ambulated to/from bathroom per pt preference, SpO2 86% on room air in bathroom so reapplied O2 [name] upon return to [name]   Stairs            Wheelchair Mobility    Modified Rankin (Stroke Patients Only)       Balance                                            Cognition Arousal/Alertness: Awake/alert Behavior During Therapy: WFL for tasks assessed/performed Overall Cognitive Status: Within Functional Limits for  tasks assessed                                        Exercises      General Comments        Pertinent Vitals/Pain Pain Assessment: No/denies pain    Home Living                      Prior Function            PT Goals (current goals can now be found in the care plan section) Progress towards PT goals: Progressing toward goals    Frequency    Min 3X/week      PT Plan Current plan remains appropriate    Co-evaluation              AM-PAC PT "6 Clicks" Daily Activity  Outcome Measure  Difficulty turning over in bed (including adjusting bedclothes, sheets and blankets)?: None Difficulty moving from lying on back to sitting on the side of the bed? : A Little Difficulty sitting down on and standing up from a chair with arms (e.g., wheelchair, bedside commode, etc,.)?: A Little Help needed moving to and from a bed to chair (including a wheelchair)?: A Little Help needed walking in hospital room?: A Little Help needed  climbing 3-5 steps with a railing? : A Lot 6 Click Score: 18    End of Session Equipment Utilized During Treatment: Gait belt;Oxygen Activity Tolerance: Patient tolerated treatment well Patient left: in chair;with call bell/phone within reach;with chair alarm set Nurse Communication: Mobility status PT Visit Diagnosis: Difficulty in walking, not elsewhere classified (R26.2)     Time: 2725-3664 PT Time Calculation (min) (ACUTE ONLY): 25 min  Charges:  $Gait Training: 8-22 mins                    G Codes:       [name], PT, DPT 03/22/2017 Pager: [phone]  [name] E 03/22/2017, 3:22 PM

## 2017-03-22 NOTE — Discharge Summary (Signed)
Physician Discharge Summary  [name] ZOX:[id number] DOB: 10-02-1927 DOA: 03/19/2017  PCP: Alwyn Ren, MD  Admit date: 03/19/2017 Discharge date: 03/22/2017  Admitted From: Home Disposition:  Home  Recommendations for Outpatient Follow-up:  1. Follow up with PCP in 1-2 weeks 2. Please obtain BMP/CBC in one week 3. Please follow up on the following pending results:  Home Health:Yes Equipment/Devices:none  Discharge Condition:stbale CODE STATUS: Full Diet recommendation: Heart Healthy   Brief/Interim Summary: 84 y.o. female past medical history significant for essential hypertension, chronic kidney disease stage III, systolic heart failure with an EF of 45%, dementia diabetes mellitus type 2 who presents with increased shortness of breath and lower extremity swelling, in the ED she was found to have an elevated BNP with a chest x-ray with showing pulmonary congestion  Discharge Diagnoses:  Active Problems:   Hyperlipidemia   Hypertension   Diabetes mellitus with nephropathy (HCC)   Rectal prolapse   Dementia without behavioral disturbance   Medication noncompliance due to cognitive impairment   Hypoxia   Acute on chronic combined systolic and diastolic CHF (congestive heart failure) (HCC)   Acute renal failure superimposed on stage 3 chronic kidney disease (HCC)   Acute on chronic combined systolic (congestive) and diastolic (congestive) heart failure (HCC)   Pressure injury of skin  Acute respiratory failure with hypoxia secondary to acute decompensated systolic and diastolic heart failure: Patient recently discharged a week ago, for similar symptoms. Likely due to noncompliance. She was started on IV Lasix which she diuresed quickly, she was changed to her oral Lasix and she remained euvolemic should continue current dose of Lasix as an outpatient.  Follow-up with PCP in 1 week check basic metabolic panel.  Hyperlipidemia: Continue statins.  Essential  hypertension She will continue her current home regimen BiDil was added. Blood pressure remained stable follow-up with PCP.  Chronic kidney disease stage III: Baseline creatinine of 2.1 avoid nephrotoxic drug.  Dementia without behavioral disturbances: Continue supportive care, at risk of sundowning, Haldol IV as needed for agitation.  Rectal prolapse/hemorrhoids: Continue current medication.  Diabetes mellitus with nephropathy (HCC) With an A1c of 6.5 no changes made to her medication.    Discharge Instructions  Discharge Instructions    Diet - low sodium heart healthy   Complete by:  As directed    Increase activity slowly   Complete by:  As directed      Allergies as of 03/22/2017      Reactions   Namzaric [memantine Hcl-donepezil Hcl] Nausea Only, Other (See Comments)   confusion   Percocet [oxycodone-acetaminophen] Other (See Comments)   loosy goosy   Sulfa Antibiotics Other (See Comments)   Tongue swells   Adhesive [tape] Other (See Comments)   Tears skin.  Please use "paper" tape   Metformin And Related Diarrhea, Other (See Comments)   Abnormal Kidney Functions    Tradjenta [linagliptin] Other (See Comments)   Low back ache, resolved once medication stopped    Penicillins Hives, Other (See Comments)   Tolerates Ceftriaxone, Has patient had a PCN reaction causing immediate rash, facial/tongue/throat swelling, SOB or lightheadedness with hypotension: Unknown Has patient had a PCN reaction causing severe rash involving mucus membranes or skin necrosis: No Has patient had a PCN reaction that required hospitalization No Has patient had a PCN reaction occurring within the last 10 years: No If all of the above answers are "NO", then may proceed with Cephalosporin use.      Medication List  TAKE these medications   aspirin EC 81 MG tablet Take 1 tablet (81 mg total) by mouth daily.   BYSTOLIC 20 MG Tabs Generic drug:  Nebivolol HCl TAKE 1 TABLET (20 MG  TOTAL) BY MOUTH DAILY. FOR BLOOD PRESSURE   cloNIDine 0.1 MG tablet Commonly known as:  CATAPRES Take 1 tablet (0.1 mg total) by mouth 2 (two) times daily.   docusate sodium 100 MG capsule Commonly known as:  COLACE Take 1 capsule (100 mg total) by mouth every 12 (twelve) hours. What changed:    when to take this  reasons to take this   ezetimibe-simvastatin 10-20 MG tablet Commonly known as:  VYTORIN TAKE 1/2 TABLET BY MOUTH ONCE NIGHTLY TO CONTROL CHOLESTEROL   feeding supplement (ENSURE ENLIVE) Liqd Take 237 mLs by mouth 2 (two) times daily between meals. What changed:    when to take this  reasons to take this   furosemide 20 MG tablet Commonly known as:  LASIX Take 20 mg by mouth 2 (two) times daily.   hydrocortisone 2.5 % rectal cream Commonly known as:  ANUSOL-HC Place 1 application rectally 3 (three) times daily. What changed:    when to take this  reasons to take this   JANUVIA 100 MG tablet Generic drug:  sitaGLIPtin Take 1 tablet (100 mg total) by mouth daily.   Loteprednol Etabonate 0.5 % Gel Commonly known as:  LOTEMAX Place 1 drop into the left eye daily. PLACE 1 DROP IN THE LEFT EYE TWICE DAILY What changed:    when to take this  additional instructions   nitroGLYCERIN 0.4 MG SL tablet Commonly known as:  NITROSTAT Place one under tongue for chest pain, up to 3 tablets What changed:    how much to take  how to take this  when to take this  reasons to take this  additional instructions   polyethylene glycol powder powder Commonly known as:  GLYCOLAX/MIRALAX Take 17 g by mouth daily as needed for mild constipation.   timolol 0.5 % ophthalmic solution Commonly known as:  BETIMOL INSTILL 1 DROP INTO THE LEFT EYE DAILY What changed:    how much to take  how to take this  when to take this  additional instructions       Allergies  Allergen Reactions  . Namzaric [Memantine Hcl-Donepezil Hcl] Nausea Only and Other (See  Comments)    confusion  . Percocet [Oxycodone-Acetaminophen] Other (See Comments)    loosy goosy  . Sulfa Antibiotics Other (See Comments)    Tongue swells  . Adhesive [Tape] Other (See Comments)    Tears skin.  Please use "paper" tape  . Metformin And Related Diarrhea and Other (See Comments)    Abnormal Kidney Functions   . Tradjenta [Linagliptin] Other (See Comments)    Low back ache, resolved once medication stopped   . Penicillins Hives and Other (See Comments)    Tolerates Ceftriaxone, Has patient had a PCN reaction causing immediate rash, facial/tongue/throat swelling, SOB or lightheadedness with hypotension: Unknown Has patient had a PCN reaction causing severe rash involving mucus membranes or skin necrosis: No Has patient had a PCN reaction that required hospitalization No Has patient had a PCN reaction occurring within the last 10 years: No If all of the above answers are "NO", then may proceed with Cephalosporin use.     Consultations:  None   Procedures/Studies: Dg Chest Port 1 View  Result Date: 03/20/2017 CLINICAL DATA:  Acute onset of shortness of breath and  generalized chest pain. EXAM: PORTABLE CHEST 1 VIEW COMPARISON:  Chest radiograph performed 03/09/2017 FINDINGS: The lungs are well-aerated. Vascular congestion is noted. Bilateral central airspace opacities raise concern for pulmonary edema, with mild left midlung atelectasis. No significant pleural effusion or pneumothorax is seen. The cardiomediastinal silhouette is borderline normal in size. No acute osseous abnormalities are seen. IMPRESSION: Vascular congestion. Bilateral central airspace opacities raise concern for pulmonary edema, with mild left midlung atelectasis. Electronically Signed   By: Roanna Raider M.D.   On: 03/20/2017 06:04   Dg Chest Port 1 View  Result Date: 03/09/2017 CLINICAL DATA:  Shortness of breath. History of congestive heart failure. EXAM: PORTABLE CHEST 1 VIEW COMPARISON:  Chest CT  02/05/2017 FINDINGS: Cardiomediastinal silhouette is enlarged. Calcific atherosclerotic disease of the aorta. There is no evidence of focal airspace consolidation or pneumothorax. Pulmonary edema with bilateral small to moderate pleural effusions. Osseous structures are without acute abnormality. Soft tissues are grossly normal. IMPRESSION: Findings of heart failure including enlarged cardiac silhouette, interstitial pulmonary edema and bilateral pleural effusions. Electronically Signed   By: Ted Mcalpine M.D.   On: 03/09/2017 12:58      Subjective: No complains  Discharge Exam: Vitals:   03/21/17 2100 03/22/17 0500  BP: (!) 169/53 (!) 128/49  Pulse: 62 70  Resp: 18 18  Temp: 98.4 F (36.9 C) 99 F (37.2 C)  SpO2: 100% 95%   Vitals:   03/21/17 0451 03/21/17 1704 03/21/17 2100 03/22/17 0500  BP: (!) 152/46 (!) 141/50 (!) 169/53 (!) 128/49  Pulse: (!) 57 65 62 70  Resp: 18  18 18   Temp: 97.7 F (36.5 C) 98.4 F (36.9 C) 98.4 F (36.9 C) 99 F (37.2 C)  TempSrc: Axillary Oral Oral Oral  SpO2: 94%  100% 95%  Weight: 55.6 kg (122 lb 9.2 oz)   53.4 kg (117 lb 11.6 oz)  Height:        General: Pt is alert, awake, not in acute distress Cardiovascular: RRR, S1/S2 +, no rubs, no gallops Respiratory: CTA bilaterally, no wheezing, no rhonchi Abdominal: Soft, NT, ND, bowel sounds + Extremities: no edema, no cyanosis    The results of significant diagnostics from this hospitalization (including imaging, microbiology, ancillary and laboratory) are listed below for reference.     Microbiology: No results found for this or any previous visit (from the past 240 hour(s)).   Labs: BNP (last 3 results) Recent Labs    02/04/17 1950 03/09/17 1100 03/19/17 2304  BNP 1,024.7* 3,635.9* 1,636.5*   Basic Metabolic Panel: Recent Labs  Lab 03/19/17 2304 03/19/17 2313 03/20/17 1410 03/21/17 0532 03/22/17 0630  NA 142 145  --  141 143  K 4.7 4.6  --  4.8 4.4  CL 110 109  --   109 106  CO2 25  --   --  26 27  GLUCOSE 194* 182*  --  113* 177*  BUN 65* 58*  --  64* 70*  CREATININE 2.18* 2.00* 1.96* 2.12* 2.29*  CALCIUM 8.2*  --   --  8.3* 8.5*  MG  --   --  2.3  --   --    Liver Function Tests: Recent Labs  Lab 03/19/17 2304  AST 17  ALT 10*  ALKPHOS 52  BILITOT 0.4  PROT 6.1*  ALBUMIN 3.2*   No results for input(s): LIPASE, AMYLASE in the last 168 hours. No results for input(s): AMMONIA in the last 168 hours. CBC: Recent Labs  Lab 03/19/17 2304 03/19/17  2313 03/20/17 1410  WBC 4.1  --  4.2  NEUTROABS 2.8  --   --   HGB 9.2* 9.9* 9.3*  HCT 28.7* 29.0* 29.0*  MCV 94.7  --  95.1  PLT 256  --  245   Cardiac Enzymes: No results for input(s): CKTOTAL, CKMB, CKMBINDEX, TROPONINI in the last 168 hours. BNP: Invalid input(s): POCBNP CBG: Recent Labs  Lab 03/21/17 0754 03/21/17 1204 03/21/17 1707 03/21/17 2052 03/22/17 0742  GLUCAP 117* 154* 155* 77 160*   D-Dimer No results for input(s): DDIMER in the last 72 hours. Hgb A1c Recent Labs    03/20/17 1410  HGBA1C 6.5*   Lipid Profile No results for input(s): CHOL, HDL, LDLCALC, TRIG, CHOLHDL, LDLDIRECT in the last 72 hours. Thyroid function studies Recent Labs    03/20/17 1410  TSH 3.950   Anemia work up No results for input(s): VITAMINB12, FOLATE, FERRITIN, TIBC, IRON, RETICCTPCT in the last 72 hours. Urinalysis    Component Value Date/Time   COLORURINE YELLOW 03/19/2017 2253   APPEARANCEUR CLEAR 03/19/2017 2253   APPEARANCEUR Clear 02/21/2013 1622   LABSPEC 1.014 03/19/2017 2253   PHURINE 6.0 03/19/2017 2253   GLUCOSEU 50 (A) 03/19/2017 2253   HGBUR NEGATIVE 03/19/2017 2253   BILIRUBINUR NEGATIVE 03/19/2017 2253   BILIRUBINUR Negative 02/21/2013 1622   KETONESUR NEGATIVE 03/19/2017 2253   PROTEINUR 100 (A) 03/19/2017 2253   UROBILINOGEN 0.2 12/23/2014 1513   NITRITE NEGATIVE 03/19/2017 2253   LEUKOCYTESUR NEGATIVE 03/19/2017 2253   LEUKOCYTESUR Negative 02/21/2013  1622   Sepsis Labs Invalid input(s): PROCALCITONIN,  WBC,  LACTICIDVEN Microbiology No results found for this or any previous visit (from the past 240 hour(s)).   Time coordinating discharge: Over 30 minutes  SIGNED:   Marinda Elk, MD  [facility] 03/22/2017, 8:36 AM Pager   If 7PM-7AM, please contact night-coverage [email or web address] Password [email or web address]

## 2017-03-22 NOTE — Care Management Note (Signed)
Case Management Note  Patient Details  Name: Ashley RheinDoris E Walters MRN: [id number] Date of Birth: January 26, 1928  Subjective/Objective:     Pt admitted for rectal prolapse                Action/Plan: [facility] with [facility]   Expected Discharge Date:  03/22/17               Expected Discharge Plan:  Home w Home Health Services  In-House Referral:     Discharge planning Services  CM Consult  Post Acute Care Choice:    Choice offered to:  Patient  DME Arranged:    DME Agency:  [facility]  [facility]  Status of Service:  Completed, signed off  If discussed at Long Length of Stay Meetings, dates discussed:    Additional CommentsGeni Bers:  Kimmarie Pascale, RN 03/22/2017, 10:27 AM

## 2017-03-23 ENCOUNTER — Other Ambulatory Visit: Payer: Self-pay | Admitting: Internal Medicine

## 2017-03-23 DIAGNOSIS — K622 Anal prolapse: Secondary | ICD-10-CM

## 2017-03-24 ENCOUNTER — Telehealth: Payer: Self-pay

## 2017-03-24 ENCOUNTER — Ambulatory Visit (INDEPENDENT_AMBULATORY_CARE_PROVIDER_SITE_OTHER): Payer: Medicare Other | Admitting: Internal Medicine

## 2017-03-24 ENCOUNTER — Other Ambulatory Visit: Payer: Self-pay | Admitting: *Deleted

## 2017-03-24 ENCOUNTER — Encounter: Payer: Self-pay | Admitting: Internal Medicine

## 2017-03-24 VITALS — BP 100/60 | HR 68 | Temp 98.2°F | Wt 104.0 lb

## 2017-03-24 DIAGNOSIS — J9611 Chronic respiratory failure with hypoxia: Secondary | ICD-10-CM

## 2017-03-24 DIAGNOSIS — I952 Hypotension due to drugs: Secondary | ICD-10-CM | POA: Diagnosis not present

## 2017-03-24 DIAGNOSIS — R609 Edema, unspecified: Secondary | ICD-10-CM | POA: Diagnosis not present

## 2017-03-24 DIAGNOSIS — N184 Chronic kidney disease, stage 4 (severe): Secondary | ICD-10-CM | POA: Diagnosis not present

## 2017-03-24 DIAGNOSIS — I5032 Chronic diastolic (congestive) heart failure: Secondary | ICD-10-CM

## 2017-03-24 DIAGNOSIS — K623 Rectal prolapse: Secondary | ICD-10-CM | POA: Diagnosis not present

## 2017-03-24 DIAGNOSIS — D631 Anemia in chronic kidney disease: Secondary | ICD-10-CM

## 2017-03-24 DIAGNOSIS — Z9981 Dependence on supplemental oxygen: Secondary | ICD-10-CM

## 2017-03-24 DIAGNOSIS — I872 Venous insufficiency (chronic) (peripheral): Secondary | ICD-10-CM | POA: Diagnosis not present

## 2017-03-24 LAB — BASIC METABOLIC PANEL WITH GFR
BUN / CREAT RATIO: 27 (calc) — AB (ref 6–22)
BUN: 57 mg/dL — ABNORMAL HIGH (ref 7–25)
CO2: 22 mmol/L (ref 20–32)
CREATININE: 2.11 mg/dL — AB (ref 0.60–0.88)
Calcium: 8.6 mg/dL (ref 8.6–10.4)
Chloride: 108 mmol/L (ref 98–110)
GFR, EST AFRICAN AMERICAN: 24 mL/min/{1.73_m2} — AB (ref >=60)
GFR, Est Non African American: 21 mL/min/{1.73_m2} — ABNORMAL LOW (ref >=60)
GLUCOSE: 173 mg/dL — AB (ref 65–99)
Potassium: 5.2 mmol/L (ref 3.5–5.3)
SODIUM: 140 mmol/L (ref 135–146)

## 2017-03-24 MED ORDER — NITROGLYCERIN 0.4 MG SL SUBL: SUBLINGUAL_TABLET | SUBLINGUAL | 3 refills | Status: AC

## 2017-03-24 NOTE — Patient Instructions (Signed)
Continue current medications as ordered  Continue with home health PT/OT, nursing  Follow up with surgeon as scheduled for rectal prolapse  Follow up with cardiology to discuss fluid pill and heart failure  Will call with lab results  Follow up as scheduled with Dr Montez Moritaarter as scheduled at the end of January 2019.  Will call with referral to nephrology

## 2017-03-24 NOTE — Telephone Encounter (Signed)
Transition Care Management Follow-Up Telephone Call   Date discharged and where: [facility] on 03/22/2017  How have you been since you were released from the hospital? Per pt daughterReuben Walters- Shes still really weak  Any patient concerns? None  Items Reviewed:   Meds: Y  Allergies: Y  Dietary Changes Reviewed: Y  Functional Questionnaire:  Independent-I Dependent-D  ADLs:   Dressing- I    Eating- I   Maintaining continence- Some incontinence   Transferring-I   Transportation- D   Meal Prep- I   Managing Meds- I w/ assist   Confirmed importance and Date/Time of follow-up visits scheduled: Yes, Dr. Montez Moritaarter 03/24/2017 @ 12pm   Confirmed with patient if condition worsens to call PCP or go to the Emergency Dept. Patient was given office number and encouraged to call back with questions or concerns: Yes

## 2017-03-24 NOTE — Telephone Encounter (Signed)
[facility] 

## 2017-03-24 NOTE — Progress Notes (Addendum)
Patient ID: [name], female   DOB: 1928/03/21, 84 y.o.   MRN: [id number]   Location:  [place] Place of Service:  OFFICE Provider: DR Arletha Grippe  Code Status: DNR per hospital record Goals of Care:  Advanced Directives 03/20/2017  Does Patient Have a Medical Advance Directive? -  Type of Advance Directive -  Does patient want to make changes to medical advance directive? -  Copy of [place] in Chart? -  Would patient like information on creating a medical advance directive? No - Patient declined     Chief Complaint  Patient presents with  . Transitions Of Care    03/19/2017 - 03/22/2017 (3 days)     HPI: Patient is a 84 y.o. female seen today for hospital follow-up s/p admission from [facility] for A/C combined CHF and CKD stage 3, dementia, rectal prolapse. She was tx with IV lasix and dose po lasix increased to 31m BID. She was d/c'd with [place] O2 ATC @ 2L/min. Cr 2.29; Hgb 9.3; A1c 6.5% at d/c  TOC completed 03/24/17. She reports feeling better since d/c. she has O2 at home but was unable to locate the portable tank prior to her appt. She is scheduled to see colorectal sx next week for rectal prolapse. She saw cardio prior to her hospital stay. She has been noncompliant with medications/medical recommendation in the past.  She is a poor historian due to dementia. Hx obtained from chart. Daughter present  Past Medical History:  Diagnosis Date  . Anemia   . Anxiety disorder   . Carotid artery occlusion   . Carotid bruit   . Chronic kidney disease (CKD), stage III (moderate) (HCC)   . Coronary artery disease    Remote PCI. Had BMS to RCA and DES stent to Diagonal in Jan. 2012   . Diastolic heart failure    EF 55 to 60% per cath in Jan 2012  . DVT (deep venous thrombosis) ([place]   . Gout   . Hyperlipidemia   . Hypertension   . Ischemic cardiomyopathy    with prior EF of 35%  . NSTEMI (non-ST elevated myocardial infarction) ([facility] Jan 2012   with PCI to  RCA and DX per Dr. CBurt Knack . Obesity   . Pneumonia 2016  . Proteinuria   . Stroke ([facility] 2013   ?  mini    . TIA (transient ischemic attack)   . Type II diabetes mellitus ([place]    long standing    Past Surgical History:  Procedure Laterality Date  . ABDOMINAL AORTAGRAM N/A 01/17/2014   Procedure: ABDOMINAL AMaxcine Ham  Surgeon: CElam Dutch MD;  [facility];  Service: Cardiovascular;  Laterality: N/A;  . ANKLE FUSION Bilateral   . APPENDECTOMY    . CATARACT EXTRACTION, BILATERAL Bilateral   . CORONARY ANGIOPLASTY WITH STENT PLACEMENT  04/05/2010   distal RCA  . FRACTURE SURGERY Right 2005   bilateral ankles   . TONSILLECTOMY      Allergies  Allergen Reactions  . Namzaric [Memantine Hcl-Donepezil Hcl] Nausea Only and Other (See Comments)    confusion  . Percocet [Oxycodone-Acetaminophen] Other (See Comments)    loosy goosy  . Sulfa Antibiotics Other (See Comments)    Tongue swells  . Adhesive [Tape] Other (See Comments)    Tears skin.  Please use "paper" tape  . Metformin And Related Diarrhea and Other (See Comments)    Abnormal Kidney Functions   . Tradjenta [Linagliptin]  Other (See Comments)    Low back ache, resolved once medication stopped   . Penicillins Hives and Other (See Comments)    Tolerates Ceftriaxone, Has patient had a PCN reaction causing immediate rash, facial/tongue/throat swelling, SOB or lightheadedness with hypotension: Unknown Has patient had a PCN reaction causing severe rash involving mucus membranes or skin necrosis: No Has patient had a PCN reaction that required hospitalization No Has patient had a PCN reaction occurring within the last 10 years: No If all of the above answers are "NO", then may proceed with Cephalosporin use.     Outpatient Encounter Medications as of 03/24/2017  Medication Sig  . aspirin EC 81 MG tablet Take 1 tablet (81 mg total) by mouth daily.  . cloNIDine (CATAPRES) 0.1 MG tablet Take 1 tablet (0.1 mg total)  by mouth 2 (two) times daily.  Marland Kitchen docusate sodium (COLACE) 100 MG capsule Take 1 capsule (100 mg total) by mouth every 12 (twelve) hours.  Marland Kitchen ezetimibe-simvastatin (VYTORIN) 10-20 MG tablet TAKE 1/2 TABLET BY MOUTH ONCE NIGHTLY TO CONTROL CHOLESTEROL  . feeding supplement, ENSURE ENLIVE, (ENSURE ENLIVE) LIQD Take 237 mLs by mouth 2 (two) times daily between meals.  . furosemide (LASIX) 20 MG tablet TAKE ONE TABLET BY MOUTH TWICE DAILY FOR HYPERTENSION  . hydrocortisone (PROCTOZONE-HC) 2.5 % rectal cream APPLY TO RECTUM THREE TIMES A DAY FOR DISCOMFORT  . isosorbide-hydrALAZINE (BIDIL) 20-37.5 MG tablet Take 1 tablet by mouth 2 (two) times daily.  Marland Kitchen JANUVIA 100 MG tablet Take 1 tablet (100 mg total) by mouth daily.  Marland Kitchen LOTEMAX 0.5 % GEL PLACE 1 DROP IN THE LEFT EYE TWICE DAILY  . Nebivolol HCl (BYSTOLIC) 20 MG TABS Take 1 tablet (20 mg total) by mouth daily. For Blood Pressure  . nitroGLYCERIN (NITROSTAT) 0.4 MG SL tablet Place one under tongue for chest pain, up to 3 tablets  . polyethylene glycol powder (GLYCOLAX/MIRALAX) powder Take 17 g by mouth daily as needed for mild constipation.   . timolol (BETIMOL) 0.5 % ophthalmic solution INSTILL 1 DROP INTO THE LEFT EYE DAILY  . [DISCONTINUED] furosemide (LASIX) 20 MG tablet Take 1 tablet (20 mg total) by mouth 2 (two) times daily.  . [DISCONTINUED] hydrocortisone (ANUSOL-HC) 2.5 % rectal cream Place 1 application rectally 3 (three) times daily.   No facility-administered encounter medications on file as of 03/24/2017.     Review of Systems:  Review of Systems  Unable to perform ROS: Dementia    Health Maintenance  Topic Date Due  . OPHTHALMOLOGY EXAM  09/29/2016  . INFLUENZA VACCINE  10/19/2016  . PNA vac Low Risk Adult (2 of 2 - PCV13) 10/19/2023 (Originally 06/29/1995)  . TETANUS/TDAP  09/30/2026 (Originally 06/28/1948)  . FOOT EXAM  08/24/2017  . URINE MICROALBUMIN  08/24/2017  . HEMOGLOBIN A1C  09/17/2017  . DEXA SCAN  Completed     Physical Exam: Vitals:   03/24/17 1209  BP: 100/60  Pulse: 68  Temp: 98.2 F (36.8 C)  TempSrc: Oral  SpO2: (!) 88%  Weight: 104 lb (47.2 kg)   Body mass index is 20.31 kg/m. Physical Exam  Constitutional: She appears well-developed.  Frail appearing in NAD  HENT:  Mouth/Throat: Oropharynx is clear and moist. No oropharyngeal exudate.  MMM; no oral thrush  Eyes: Pupils are equal, round, and reactive to light. No scleral icterus.  Neck: Neck supple. Muscular tenderness present. Carotid bruit is not present. Decreased range of motion present. No tracheal deviation present. No thyromegaly present.  Cardiovascular:  Normal rate and intact distal pulses. An irregular rhythm present. Frequent extrasystoles are present. Exam reveals gallop and S4. Exam reveals no friction rub and no decreased pulses.  Murmur (1/6 SEM--> carotid b/l) heard. +1 pitting LE edema b/l. No calf TTP  Pulmonary/Chest: Effort normal. No stridor. No respiratory distress. She has no wheezes. She has rales.  Abdominal: Soft. Normal appearance and bowel sounds are normal. She exhibits no distension and no mass. There is no hepatomegaly. There is no tenderness. There is no rigidity, no rebound and no guarding. No hernia.  Musculoskeletal: She exhibits edema. She exhibits no tenderness.  Lymphadenopathy:    She has no cervical adenopathy.  Neurological: She is alert.  Skin: Skin is warm and dry. No rash noted.  Psychiatric: She has a normal mood and affect. Her behavior is normal. Thought content is delusional.    Labs reviewed: Basic Metabolic Panel: Recent Labs    09/08/16 1523  02/05/17 0155 02/05/17 0749  03/12/17 0458  03/19/17 2304 03/19/17 2313 03/20/17 1410 03/21/17 0532 03/22/17 0630  NA  --    < >  --  142   < > 140   < > 142 145  --  141 143  K  --    < >  --  3.5   < > 4.7   < > 4.7 4.6  --  4.8 4.4  CL  --    < >  --  109   < > 105   < > 110 109  --  109 106  CO2  --    < >  --  22   < >  29   < > 25  --   --  26 27  GLUCOSE  --    < >  --  103*   < > 152*   < > 194* 182*  --  113* 177*  BUN  --    < >  --  28*   < > 51*   < > 65* 58*  --  64* 70*  CREATININE  --    < >  --  1.94*   < > 1.99*   < > 2.18* 2.00* 1.96* 2.12* 2.29*  CALCIUM  --    < >  --  8.4*   < > 8.1*   < > 8.2*  --   --  8.3* 8.5*  MG  --    < >  --  1.9  --  2.3  --   --   --  2.3  --   --   TSH 0.78  --  2.485  --   --   --   --   --   --  3.950  --   --    < > = values in this interval not displayed.   Liver Function Tests: Recent Labs    02/04/17 1950 03/11/17 0539 03/19/17 2304  AST 13* 11* 17  ALT 7* 7* 10*  ALKPHOS 59 41 52  BILITOT 0.5 0.5 0.4  PROT 6.3* 5.0* 6.1*  ALBUMIN 3.6 2.6* 3.2*   Recent Labs    04/29/16 1407 10/01/16 1523 11/26/16 1725  LIPASE 33 21 18   No results for input(s): AMMONIA in the last 8760 hours. CBC: Recent Labs    02/05/17 0749  03/09/17 1100 03/12/17 0458 03/19/17 2304 03/19/17 2313 03/20/17 1410  WBC 7.6   < > 8.1 4.8 4.1  --  4.2  NEUTROABS 5.8  --  7.0  --  2.8  --   --   HGB 10.8*   < > 9.8* 9.3* 9.2* 9.9* 9.3*  HCT 33.6*   < > 31.5* 29.1* 28.7* 29.0* 29.0*  MCV 93.1   < > 93.5 91.5 94.7  --  95.1  PLT 166   < > 230 211 256  --  245   < > = values in this interval not displayed.   Lipid Panel: Recent Labs    04/20/16 1351 08/29/16 1051  CHOL 148 130  HDL 42* 39*  LDLCALC 71 52  TRIG 173* 196*  CHOLHDL 3.5 3.3   Lab Results  Component Value Date   HGBA1C 6.5 (H) 03/20/2017    Procedures since last visit: Dg Chest Port 1 View  Result Date: 03/20/2017 CLINICAL DATA:  Acute onset of shortness of breath and generalized chest pain. EXAM: PORTABLE CHEST 1 VIEW COMPARISON:  Chest radiograph performed 03/09/2017 FINDINGS: The lungs are well-aerated. Vascular congestion is noted. Bilateral central airspace opacities raise concern for pulmonary edema, with mild left midlung atelectasis. No significant pleural effusion or pneumothorax is  seen. The cardiomediastinal silhouette is borderline normal in size. No acute osseous abnormalities are seen. IMPRESSION: Vascular congestion. Bilateral central airspace opacities raise concern for pulmonary edema, with mild left midlung atelectasis. Electronically Signed   By: Garald Balding M.D.   On: 03/20/2017 06:04    Assessment/Plan   ICD-10-CM   1. CKD (chronic kidney disease) stage 4, GFR 15-29 ml/min (HCC)  - worsening N18.4 BMP with eGFR    Ambulatory referral to Nephrology  2. Hypotension due to drugs I95.2   3. Edema of both lower legs due to peripheral venous insufficiency I87.2 BMP with eGFR   R60.9   4. Chronic diastolic heart failure, NYHA class 1 (HCC) I50.32   5. Rectal prolapse K62.3   6. Anemia of chronic renal failure, stage 4 (severe) (HCC) N18.4    D63.1   7. Chronic respiratory failure with hypoxia, on home O2 therapy (HCC) J96.11    Z99.81    due to CHF NYHA class 1   Her BP will not tolerate higher doses of diuretic  Continue current medications as ordered  Continue with home health PT/OT, nursing  Follow up with surgeon as scheduled for rectal prolapse  Follow up with cardiology to discuss fluid pill and heart failure  Will call with lab results  Follow up as scheduled with Dr Eulas Post as scheduled at the end of January 2019.  Will call with referral to nephrology  ADDENDUM: Medical record from hospitalization reviewed as indicated in HPI. TIME SPENT >45 minutes   Omah Dewalt S. Perlie Gold  [facility] [address] [place], [place] [address] [phone] Cell (Monday-Friday 8 AM - 5 PM) [phone] After 5 PM and follow prompts

## 2017-03-31 ENCOUNTER — Encounter (HOSPITAL_COMMUNITY): Payer: Self-pay | Admitting: Emergency Medicine

## 2017-03-31 ENCOUNTER — Other Ambulatory Visit: Payer: Self-pay

## 2017-03-31 ENCOUNTER — Inpatient Hospital Stay (HOSPITAL_COMMUNITY): Payer: Medicare Other

## 2017-03-31 ENCOUNTER — Emergency Department (HOSPITAL_COMMUNITY): Payer: Medicare Other

## 2017-03-31 ENCOUNTER — Inpatient Hospital Stay (HOSPITAL_COMMUNITY)
Admission: EM | Admit: 2017-03-31 | Discharge: 2017-04-07 | DRG: 291 | Disposition: A | Discharge status: Hospice | Payer: Medicare Other | Attending: Internal Medicine | Admitting: Internal Medicine

## 2017-03-31 DIAGNOSIS — Z880 Allergy status to penicillin: Secondary | ICD-10-CM

## 2017-03-31 DIAGNOSIS — F03918 Unspecified dementia, unspecified severity, with other behavioral disturbance: Secondary | ICD-10-CM

## 2017-03-31 DIAGNOSIS — N179 Acute kidney failure, unspecified: Secondary | ICD-10-CM | POA: Diagnosis not present

## 2017-03-31 DIAGNOSIS — E1122 Type 2 diabetes mellitus with diabetic chronic kidney disease: Secondary | ICD-10-CM | POA: Diagnosis not present

## 2017-03-31 DIAGNOSIS — Z9911 Dependence on respirator [ventilator] status: Secondary | ICD-10-CM

## 2017-03-31 DIAGNOSIS — Z888 Allergy status to other drugs, medicaments and biological substances status: Secondary | ICD-10-CM | POA: Diagnosis not present

## 2017-03-31 DIAGNOSIS — Z7189 Other specified counseling: Secondary | ICD-10-CM | POA: Diagnosis not present

## 2017-03-31 DIAGNOSIS — Z79899 Other long term (current) drug therapy: Secondary | ICD-10-CM

## 2017-03-31 DIAGNOSIS — N183 Chronic kidney disease, stage 3 (moderate): Secondary | ICD-10-CM

## 2017-03-31 DIAGNOSIS — E875 Hyperkalemia: Secondary | ICD-10-CM | POA: Diagnosis present

## 2017-03-31 DIAGNOSIS — I5043 Acute on chronic combined systolic (congestive) and diastolic (congestive) heart failure: Secondary | ICD-10-CM | POA: Diagnosis present

## 2017-03-31 DIAGNOSIS — F0391 Unspecified dementia with behavioral disturbance: Secondary | ICD-10-CM | POA: Diagnosis present

## 2017-03-31 DIAGNOSIS — Z9981 Dependence on supplemental oxygen: Secondary | ICD-10-CM

## 2017-03-31 DIAGNOSIS — I509 Heart failure, unspecified: Secondary | ICD-10-CM

## 2017-03-31 DIAGNOSIS — Z87891 Personal history of nicotine dependence: Secondary | ICD-10-CM

## 2017-03-31 DIAGNOSIS — J9690 Respiratory failure, unspecified, unspecified whether with hypoxia or hypercapnia: Secondary | ICD-10-CM | POA: Diagnosis present

## 2017-03-31 DIAGNOSIS — Z515 Encounter for palliative care: Secondary | ICD-10-CM | POA: Diagnosis not present

## 2017-03-31 DIAGNOSIS — I2510 Atherosclerotic heart disease of native coronary artery without angina pectoris: Secondary | ICD-10-CM | POA: Diagnosis present

## 2017-03-31 DIAGNOSIS — E785 Hyperlipidemia, unspecified: Secondary | ICD-10-CM | POA: Diagnosis present

## 2017-03-31 DIAGNOSIS — G9340 Encephalopathy, unspecified: Secondary | ICD-10-CM | POA: Diagnosis not present

## 2017-03-31 DIAGNOSIS — I361 Nonrheumatic tricuspid (valve) insufficiency: Secondary | ICD-10-CM

## 2017-03-31 DIAGNOSIS — I130 Hypertensive heart and chronic kidney disease with heart failure and stage 1 through stage 4 chronic kidney disease, or unspecified chronic kidney disease: Principal | ICD-10-CM | POA: Diagnosis present

## 2017-03-31 DIAGNOSIS — J9601 Acute respiratory failure with hypoxia: Secondary | ICD-10-CM | POA: Diagnosis present

## 2017-03-31 DIAGNOSIS — Z8673 Personal history of transient ischemic attack (TIA), and cerebral infarction without residual deficits: Secondary | ICD-10-CM | POA: Diagnosis not present

## 2017-03-31 DIAGNOSIS — I160 Hypertensive urgency: Secondary | ICD-10-CM | POA: Diagnosis not present

## 2017-03-31 DIAGNOSIS — Z66 Do not resuscitate: Secondary | ICD-10-CM | POA: Diagnosis present

## 2017-03-31 DIAGNOSIS — Z4659 Encounter for fitting and adjustment of other gastrointestinal appliance and device: Secondary | ICD-10-CM | POA: Diagnosis not present

## 2017-03-31 DIAGNOSIS — I255 Ischemic cardiomyopathy: Secondary | ICD-10-CM | POA: Diagnosis present

## 2017-03-31 DIAGNOSIS — J810 Acute pulmonary edema: Secondary | ICD-10-CM | POA: Diagnosis not present

## 2017-03-31 DIAGNOSIS — Z9289 Personal history of other medical treatment: Secondary | ICD-10-CM

## 2017-03-31 DIAGNOSIS — Z9119 Patient's noncompliance with other medical treatment and regimen: Secondary | ICD-10-CM

## 2017-03-31 DIAGNOSIS — Z9111 Patient's noncompliance with dietary regimen: Secondary | ICD-10-CM

## 2017-03-31 DIAGNOSIS — Z882 Allergy status to sulfonamides status: Secondary | ICD-10-CM | POA: Diagnosis not present

## 2017-03-31 DIAGNOSIS — Z9114 Patient's other noncompliance with medication regimen: Secondary | ICD-10-CM

## 2017-03-31 DIAGNOSIS — L8990 Pressure ulcer of unspecified site, unspecified stage: Secondary | ICD-10-CM | POA: Diagnosis present

## 2017-03-31 DIAGNOSIS — R0902 Hypoxemia: Secondary | ICD-10-CM | POA: Diagnosis not present

## 2017-03-31 DIAGNOSIS — I480 Paroxysmal atrial fibrillation: Secondary | ICD-10-CM | POA: Diagnosis present

## 2017-03-31 DIAGNOSIS — I252 Old myocardial infarction: Secondary | ICD-10-CM | POA: Diagnosis not present

## 2017-03-31 DIAGNOSIS — Z955 Presence of coronary angioplasty implant and graft: Secondary | ICD-10-CM

## 2017-03-31 DIAGNOSIS — I4891 Unspecified atrial fibrillation: Secondary | ICD-10-CM | POA: Diagnosis not present

## 2017-03-31 DIAGNOSIS — E43 Unspecified severe protein-calorie malnutrition: Secondary | ICD-10-CM | POA: Diagnosis not present

## 2017-03-31 DIAGNOSIS — Z7982 Long term (current) use of aspirin: Secondary | ICD-10-CM | POA: Diagnosis not present

## 2017-03-31 DIAGNOSIS — D631 Anemia in chronic kidney disease: Secondary | ICD-10-CM | POA: Diagnosis present

## 2017-03-31 DIAGNOSIS — I10 Essential (primary) hypertension: Secondary | ICD-10-CM | POA: Diagnosis not present

## 2017-03-31 DIAGNOSIS — R0602 Shortness of breath: Secondary | ICD-10-CM | POA: Diagnosis present

## 2017-03-31 LAB — GLUCOSE, CAPILLARY
GLUCOSE-CAPILLARY: 136 mg/dL — AB (ref 65–99)
GLUCOSE-CAPILLARY: 161 mg/dL — AB (ref 65–99)
GLUCOSE-CAPILLARY: 166 mg/dL — AB (ref 65–99)
GLUCOSE-CAPILLARY: 48 mg/dL — AB (ref 65–99)
GLUCOSE-CAPILLARY: 83 mg/dL (ref 65–99)
Glucose-Capillary: 71 mg/dL (ref 65–99)
Glucose-Capillary: 52 mg/dL — ABNORMAL LOW (ref 65–99)
Glucose-Capillary: 108 mg/dL — ABNORMAL HIGH (ref 65–99)

## 2017-03-31 LAB — BASIC METABOLIC PANEL
Anion gap: 9 (ref 5–15)
Anion gap: 11 (ref 5–15)
BUN: 39 mg/dL — ABNORMAL HIGH (ref 6–20)
BUN: 38 mg/dL — ABNORMAL HIGH (ref 6–20)
CHLORIDE: 107 mmol/L (ref 101–111)
CHLORIDE: 109 mmol/L (ref 101–111)
CO2: 24 mmol/L (ref 22–32)
CO2: 20 mmol/L — AB (ref 22–32)
Calcium: 8.4 mg/dL — ABNORMAL LOW (ref 8.9–10.3)
Calcium: 8.4 mg/dL — ABNORMAL LOW (ref 8.9–10.3)
Creatinine, Ser: 1.9 mg/dL — ABNORMAL HIGH (ref 0.44–1.00)
Creatinine, Ser: 2.03 mg/dL — ABNORMAL HIGH (ref 0.44–1.00)
GFR calc Af Amer: 26 mL/min — ABNORMAL LOW (ref >60)
GFR calc non Af Amer: 23 mL/min — ABNORMAL LOW (ref >60)
GFR calc non Af Amer: 21 mL/min — ABNORMAL LOW (ref >60)
GFR, EST AFRICAN AMERICAN: 24 mL/min — AB (ref >60)
GLUCOSE: 153 mg/dL — AB (ref 65–99)
Glucose, Bld: 180 mg/dL — ABNORMAL HIGH (ref 65–99)
POTASSIUM: 5.6 mmol/L — AB (ref 3.5–5.1)
Potassium: 4.8 mmol/L (ref 3.5–5.1)
SODIUM: 140 mmol/L (ref 135–145)
Sodium: 140 mmol/L (ref 135–145)

## 2017-03-31 LAB — ECHOCARDIOGRAM COMPLETE
CHL CUP MV DEC (S): 289
CHL CUP REG VEL DIAS: 125 cm/s
E decel time: 289 msec
FS: 30 % (ref 28–44)
HEIGHTINCHES: 60 in
IV/PV OW: 0.75
LA ID, A-P, ES: 45 mm
LA vol index: 57.2 mL/m2
LA vol: 80.9 mL
LADIAMINDEX: 3.18 cm/m2
LAVOLA4C: 86.7 mL
LEFT ATRIUM END SYS DIAM: 45 mm
LV PW d: 13.7 mm — AB (ref 0.6–1.1)
LVOT area: 2.84 cm2
LVOT diameter: 19 mm
MV Peak grad: 5 mmHg
MV pk E vel: 114 m/s
MVPKAVEL: 121 m/s
PV Reg grad dias: 6 mmHg
Reg peak vel: 258 cm/s
TAPSE: 18.5 mm
TR max vel: 258 cm/s
WEIGHTICAEL: 1664 [oz_av]

## 2017-03-31 LAB — URINALYSIS, ROUTINE W REFLEX MICROSCOPIC
Bacteria, UA: NONE SEEN
Bilirubin Urine: NEGATIVE
GLUCOSE, UA: 50 mg/dL — AB
Hgb urine dipstick: NEGATIVE
Ketones, ur: NEGATIVE mg/dL
Leukocytes, UA: NEGATIVE
Nitrite: NEGATIVE
PH: 6 (ref 5.0–8.0)
Protein, ur: 100 mg/dL — AB
SPECIFIC GRAVITY, URINE: 1.01 (ref 1.005–1.030)
Squamous Epithelial / LPF: NONE SEEN

## 2017-03-31 LAB — I-STAT ARTERIAL BLOOD GAS, ED
Acid-base deficit: 3 mmol/L — ABNORMAL HIGH (ref 0.0–2.0)
Bicarbonate: 21.9 mmol/L (ref 20.0–28.0)
O2 Saturation: 99 %
PCO2 ART: 36.6 mmHg (ref 32.0–48.0)
PH ART: 7.384 (ref 7.350–7.450)
PO2 ART: 142 mmHg — AB (ref 83.0–108.0)
Patient temperature: 98.6
TCO2: 23 mmol/L (ref 22–32)

## 2017-03-31 LAB — I-STAT TROPONIN, ED: Troponin i, poc: 0.02 ng/mL (ref 0.00–0.08)

## 2017-03-31 LAB — BRAIN NATRIURETIC PEPTIDE: B Natriuretic Peptide: 1499.8 pg/mL — ABNORMAL HIGH (ref 0.0–100.0)

## 2017-03-31 LAB — TROPONIN I
TROPONIN I: 0.06 ng/mL — AB (ref <0.03)
TROPONIN I: 0.06 ng/mL — AB (ref <0.03)
Troponin I: 0.06 ng/mL (ref <0.03)

## 2017-03-31 LAB — CBC
HEMATOCRIT: 32.3 % — AB (ref 36.0–46.0)
HEMOGLOBIN: 10 g/dL — AB (ref 12.0–15.0)
MCH: 29.4 pg (ref 26.0–34.0)
MCHC: 31 g/dL (ref 30.0–36.0)
MCV: 95 fL (ref 78.0–100.0)
Platelets: 259 10*3/uL (ref 150–400)
RBC: 3.4 MIL/uL — ABNORMAL LOW (ref 3.87–5.11)
RDW: 15.7 % — ABNORMAL HIGH (ref 11.5–15.5)
WBC: 7.6 10*3/uL (ref 4.0–10.5)

## 2017-03-31 LAB — MRSA PCR SCREENING: MRSA by PCR: NEGATIVE

## 2017-03-31 LAB — TRIGLYCERIDES: TRIGLYCERIDES: 819 mg/dL — AB (ref <150)

## 2017-03-31 MED ORDER — SODIUM CHLORIDE 0.9 % IV SOLN: 250.0000 mL | INTRAVENOUS | Status: DC | PRN

## 2017-03-31 MED ORDER — LORAZEPAM 2 MG/ML IJ SOLN
0.5000 mg | Freq: Once | INTRAMUSCULAR | Status: AC
  Administered 2017-03-31: 0.5 mg via INTRAVENOUS
  Filled 2017-03-31: qty 1

## 2017-03-31 MED ORDER — NITROGLYCERIN 2 % TD OINT
1.0000 [in_us] | TOPICAL_OINTMENT | Freq: Once | TRANSDERMAL | Status: AC
  Administered 2017-03-31: 1 [in_us] via TOPICAL
  Filled 2017-03-31: qty 1

## 2017-03-31 MED ORDER — HYDRALAZINE HCL 20 MG/ML IJ SOLN
10.0000 mg | Freq: Four times a day (QID) | INTRAMUSCULAR | Status: DC
  Administered 2017-03-31: 10 mg via INTRAVENOUS
  Filled 2017-03-31: qty 1

## 2017-03-31 MED ORDER — FUROSEMIDE 10 MG/ML IJ SOLN: 20.0000 mg | Freq: Two times a day (BID) | INTRAMUSCULAR | Status: DC

## 2017-03-31 MED ORDER — FUROSEMIDE 10 MG/ML IJ SOLN
80.0000 mg | Freq: Once | INTRAMUSCULAR | Status: AC
  Administered 2017-03-31: 80 mg via INTRAVENOUS
  Filled 2017-03-31: qty 8

## 2017-03-31 MED ORDER — FUROSEMIDE 10 MG/ML IJ SOLN
40.0000 mg | Freq: Once | INTRAMUSCULAR | Status: AC
  Administered 2017-03-31: 40 mg via INTRAVENOUS
  Filled 2017-03-31: qty 4

## 2017-03-31 MED ORDER — DEXTROSE 50 % IV SOLN
25.0000 mL | INTRAVENOUS | Status: AC
  Administered 2017-03-31: 25 mL via INTRAVENOUS

## 2017-03-31 MED ORDER — PROPOFOL 1000 MG/100ML IV EMUL: 0.0000 ug/kg/min | INTRAVENOUS | Status: DC

## 2017-03-31 MED ORDER — CHLORHEXIDINE GLUCONATE 0.12% ORAL RINSE (MEDLINE KIT)
15.0000 mL | Freq: Two times a day (BID) | OROMUCOSAL | Status: DC
  Administered 2017-03-31 – 2017-04-01 (×3): 15 mL via OROMUCOSAL

## 2017-03-31 MED ORDER — PRO-STAT SUGAR FREE PO LIQD
30.0000 mL | Freq: Two times a day (BID) | ORAL | Status: DC
  Administered 2017-03-31 – 2017-04-01 (×3): 30 mL
  Filled 2017-03-31 (×5): qty 30

## 2017-03-31 MED ORDER — INSULIN ASPART 100 UNIT/ML ~~LOC~~ SOLN
0.0000 [IU] | SUBCUTANEOUS | Status: DC
  Administered 2017-03-31 – 2017-04-01 (×3): 2 [IU] via SUBCUTANEOUS
  Administered 2017-04-01 (×2): 1 [IU] via SUBCUTANEOUS
  Administered 2017-04-02: 2 [IU] via SUBCUTANEOUS
  Administered 2017-04-02: 5 [IU] via SUBCUTANEOUS
  Administered 2017-04-02: 3 [IU] via SUBCUTANEOUS
  Administered 2017-04-03: 1 [IU] via SUBCUTANEOUS

## 2017-03-31 MED ORDER — ETOMIDATE 2 MG/ML IV SOLN
10.0000 mg | Freq: Once | INTRAVENOUS | Status: AC
  Administered 2017-03-31: 10 mg via INTRAVENOUS

## 2017-03-31 MED ORDER — SODIUM CHLORIDE 0.9 % IV SOLN
INTRAVENOUS | Status: DC
  Administered 2017-03-31: 08:00:00 via INTRAVENOUS

## 2017-03-31 MED ORDER — TIMOLOL MALEATE 0.5 % OP SOLN
1.0000 [drp] | Freq: Every day | OPHTHALMIC | Status: DC
  Administered 2017-03-31 – 2017-04-07 (×8): 1 [drp] via OPHTHALMIC
  Filled 2017-03-31 (×3): qty 5

## 2017-03-31 MED ORDER — FENTANYL 2500MCG IN NS 250ML (10MCG/ML) PREMIX INFUSION
0.0000 ug/h | INTRAVENOUS | Status: DC
Start: 2017-03-31 — End: 2017-04-02
  Administered 2017-03-31: 10 ug/h via INTRAVENOUS
  Filled 2017-03-31: qty 250

## 2017-03-31 MED ORDER — ISOSORB DINITRATE-HYDRALAZINE 20-37.5 MG PO TABS
1.0000 | ORAL_TABLET | Freq: Two times a day (BID) | ORAL | Status: DC
  Administered 2017-03-31: 1 via ORAL
  Filled 2017-03-31: qty 1

## 2017-03-31 MED ORDER — METOPROLOL TARTRATE 5 MG/5ML IV SOLN
2.5000 mg | Freq: Four times a day (QID) | INTRAVENOUS | Status: DC | PRN
  Administered 2017-04-01: 5 mg via INTRAVENOUS
  Filled 2017-03-31: qty 5

## 2017-03-31 MED ORDER — FENTANYL CITRATE (PF) 100 MCG/2ML IJ SOLN: 50.0000 ug | INTRAMUSCULAR | Status: DC | PRN

## 2017-03-31 MED ORDER — VITAL AF 1.2 CAL PO LIQD
1000.0000 mL | ORAL | Status: DC
  Administered 2017-03-31: 1000 mL

## 2017-03-31 MED ORDER — NITROGLYCERIN IN D5W 200-5 MCG/ML-% IV SOLN
0.0000 ug/min | INTRAVENOUS | Status: DC
  Filled 2017-03-31: qty 250

## 2017-03-31 MED ORDER — ISOSORB DINITRATE-HYDRALAZINE 20-37.5 MG PO TABS
1.0000 | ORAL_TABLET | Freq: Two times a day (BID) | ORAL | Status: DC
Start: 2017-03-31 — End: 2017-04-02
  Administered 2017-03-31 – 2017-04-02 (×3): 1
  Filled 2017-03-31 (×4): qty 1

## 2017-03-31 MED ORDER — HEPARIN SODIUM (PORCINE) 5000 UNIT/ML IJ SOLN
5000.0000 [IU] | Freq: Three times a day (TID) | INTRAMUSCULAR | Status: DC
Start: 2017-03-31 — End: 2017-04-07
  Administered 2017-03-31 – 2017-04-07 (×22): 5000 [IU] via SUBCUTANEOUS
  Filled 2017-03-31 (×23): qty 1

## 2017-03-31 MED ORDER — DEXTROSE 50 % IV SOLN
INTRAVENOUS | Status: AC
  Administered 2017-03-31: 25 mL via INTRAVENOUS
  Filled 2017-03-31: qty 50

## 2017-03-31 MED ORDER — PANTOPRAZOLE SODIUM 40 MG IV SOLR
40.0000 mg | Freq: Every day | INTRAVENOUS | Status: DC
  Administered 2017-03-31 – 2017-04-02 (×3): 40 mg via INTRAVENOUS
  Filled 2017-03-31 (×4): qty 40

## 2017-03-31 MED ORDER — ORAL CARE MOUTH RINSE
15.0000 mL | Freq: Four times a day (QID) | OROMUCOSAL | Status: DC
  Administered 2017-03-31 – 2017-04-01 (×5): 15 mL via OROMUCOSAL

## 2017-03-31 MED ORDER — SUCCINYLCHOLINE CHLORIDE 20 MG/ML IJ SOLN
150.0000 mg | Freq: Once | INTRAMUSCULAR | Status: AC
  Administered 2017-03-31: 150 mg via INTRAVENOUS

## 2017-03-31 MED ORDER — PROPOFOL 1000 MG/100ML IV EMUL
INTRAVENOUS | Status: AC
  Administered 2017-03-31: 20 ug/kg/min
  Filled 2017-03-31: qty 100

## 2017-03-31 NOTE — ED Notes (Signed)
Returned from MRI 

## 2017-03-31 NOTE — H&P (Signed)
PULMONARY / CRITICAL CARE MEDICINE   Name: [name] MRN: [id number] DOB: 15-Apr-1927    ADMISSION DATE:  03/31/2017 CONSULTATION DATE:  03/31/17  REFERRING MD:  Judd Lienelo  CHIEF COMPLAINT:  SOB  HISTORY OF PRESENT ILLNESS:  Pt is encephelopathic; therefore, this HPI is obtained from chart review. [name] is a 84 y.o. female with PMH as outlined below. [facility] with SOB and chest pain.  In ED, she had SpO2 in 80s on room air and she was found to have signs of volume overload.  She also had hypertensive urgency with BP of 180 / 100.  CXR demonstrated interstitial edema and BNP was 1, 500. She was started on BiPAP for increased WOB; however, she failed and ultimately required intubation.  Daughter informed ED RN that symptoms started the day prior and progressed.  No fevers/chills/sweats, cough, N/V/D, abd pain, myalgias.  Has had increasing LE edema.  Of note, she had recent admission 03/19/17 through 03/22/17 for similar presentation; though, did not require intubation (CHF exacerbation, acute hypoxic respiratory failure).  PAST MEDICAL HISTORY :  She  has a past medical history of Anemia, Anxiety disorder, Carotid artery occlusion, Carotid bruit, Chronic kidney disease (CKD), stage III (moderate) (HCC), Coronary artery disease, Diastolic heart failure, DVT (deep venous thrombosis) (HCC), Gout, Hyperlipidemia, Hypertension, Ischemic cardiomyopathy, NSTEMI (non-ST elevated myocardial infarction) [facility](HCC) (Jan 2012), Obesity, Pneumonia (2016), Proteinuria, Stroke (HCC) (2013), TIA (transient ischemic attack), and Type II diabetes mellitus (HCC).  PAST SURGICAL HISTORY: She  has a past surgical history that includes Appendectomy; Tonsillectomy; Ankle Fusion (Bilateral); Coronary angioplasty with stent (04/05/2010); Fracture surgery (Right, 2005); abdominal aortagram (N/A, 01/17/2014); and Cataract extraction, bilateral (Bilateral).  Allergies  Allergen Reactions  .  Namzaric [Memantine Hcl-Donepezil Hcl] Nausea Only and Other (See Comments)    confusion  . Percocet [Oxycodone-Acetaminophen] Other (See Comments)    loosy goosy  . Sulfa Antibiotics Other (See Comments)    Tongue swells  . Adhesive [Tape] Other (See Comments)    Tears skin.  Please use "paper" tape  . Metformin And Related Diarrhea and Other (See Comments)    Abnormal Kidney Functions   . Tradjenta [Linagliptin] Other (See Comments)    Low back ache, resolved once medication stopped   . Penicillins Hives and Other (See Comments)    Tolerates Ceftriaxone, Has patient had a PCN reaction causing immediate rash, facial/tongue/throat swelling, SOB or lightheadedness with hypotension: Unknown Has patient had a PCN reaction causing severe rash involving mucus membranes or skin necrosis: No Has patient had a PCN reaction that required hospitalization No Has patient had a PCN reaction occurring within the last 10 years: No If all of the above answers are "NO", then may proceed with Cephalosporin use.     No current facility-administered medications on file prior to encounter.    Current Outpatient Medications on File Prior to Encounter  Medication Sig  . aspirin EC 81 MG tablet Take 1 tablet (81 mg total) by mouth daily.  . cloNIDine (CATAPRES) 0.1 MG tablet Take 1 tablet (0.1 mg total) by mouth 2 (two) times daily.  Marland Kitchen. docusate sodium (COLACE) 100 MG capsule Take 1 capsule (100 mg total) by mouth every 12 (twelve) hours.  Marland Kitchen. ezetimibe-simvastatin (VYTORIN) 10-20 MG tablet TAKE 1/2 TABLET BY MOUTH ONCE NIGHTLY TO CONTROL CHOLESTEROL  . feeding supplement, ENSURE ENLIVE, (ENSURE ENLIVE) LIQD Take 237 mLs by mouth 2 (two) times daily between meals.  . furosemide (LASIX) 20 MG tablet  TAKE ONE TABLET BY MOUTH TWICE DAILY FOR HYPERTENSION  . hydrocortisone (PROCTOZONE-HC) 2.5 % rectal cream APPLY TO RECTUM THREE TIMES A DAY FOR DISCOMFORT  . isosorbide-hydrALAZINE (BIDIL) 20-37.5 MG tablet Take 1  tablet by mouth 2 (two) times daily.  Marland Kitchen JANUVIA 100 MG tablet Take 1 tablet (100 mg total) by mouth daily.  Marland Kitchen LOTEMAX 0.5 % GEL PLACE 1 DROP IN THE LEFT EYE TWICE DAILY  . Nebivolol HCl (BYSTOLIC) 20 MG TABS Take 1 tablet (20 mg total) by mouth daily. For Blood Pressure  . nitroGLYCERIN (NITROSTAT) 0.4 MG SL tablet Place one under tongue for chest pain, up to 3 tablets  . polyethylene glycol powder (GLYCOLAX/MIRALAX) powder Take 17 g by mouth daily as needed for mild constipation.   . timolol (BETIMOL) 0.5 % ophthalmic solution INSTILL 1 DROP INTO THE LEFT EYE DAILY    FAMILY HISTORY:  Her indicated that her mother is deceased. She indicated that her father is deceased. She indicated that her sister is deceased. She indicated that her maternal grandmother is deceased. She indicated that her maternal grandfather is deceased. She indicated that her paternal grandmother is deceased. She indicated that her paternal grandfather is deceased. She indicated that both of her daughters are alive. She indicated that the status of her neg hx is unknown.   SOCIAL HISTORY: She  reports that she has quit smoking. she has never used smokeless tobacco. She reports that she does not drink alcohol or use drugs.  REVIEW OF SYSTEMS:   Unable to obtain as pt is encephalopathic.  SUBJECTIVE:  On vent, unresponsive.  VITAL SIGNS: BP (!) 143/38   Pulse 61   Temp 98 F (36.7 C) (Oral)   Resp 17   Ht 5' (1.524 m)   Wt 47.2 kg (104 lb)   SpO2 100%   BMI 20.31 kg/m   HEMODYNAMICS:    VENTILATOR SETTINGS: Vent Mode: PRVC FiO2 (%):  [60 %] 60 % Set Rate:  [14 bmp] 14 bmp Vt Set:  [450 mL] 450 mL PEEP:  [5 cmH20] 5 cmH20 Plateau Pressure:  [19 cmH20] 19 cmH20  INTAKE / OUTPUT: No intake/output data recorded.   PHYSICAL EXAMINATION: General: Elderly female, in NAD. Neuro: Sedated, non-responsive. HEENT: [place]/AT. PERRL, sclerae anicteric. Cardiovascular: RRR, no M/R/G.  Lungs: Respirations even and  unlabored.  CTA bilaterally, No W/R/R. Abdomen: BS x 4, soft, NT/ND. Blood tinged secretions from OGT. Musculoskeletal: No gross deformities, 1+ edema.  Skin: Intact, warm, no rashes.  LABS:  BMET Recent Labs  Lab 03/24/17 1352 03/31/17 0202  NA 140 140  K 5.2 5.6*  CL 108 107  CO2 22 24  BUN 57* 39*  CREATININE 2.11* 1.90*  GLUCOSE 173* 153*    Electrolytes Recent Labs  Lab 03/24/17 1352 03/31/17 0202  CALCIUM 8.6 8.4*    CBC Recent Labs  Lab 03/31/17 0202  WBC 7.6  HGB 10.0*  HCT 32.3*  PLT 259    Coag's No results for input(s): APTT, INR in the last 168 hours.  Sepsis Markers No results for input(s): LATICACIDVEN, PROCALCITON, O2SATVEN in the last 168 hours.  ABG No results for input(s): PHART, PCO2ART, PO2ART in the last 168 hours.  Liver Enzymes No results for input(s): AST, ALT, ALKPHOS, BILITOT, ALBUMIN in the last 168 hours.  Cardiac Enzymes No results for input(s): TROPONINI, PROBNP in the last 168 hours.  Glucose No results for input(s): GLUCAP in the last 168 hours.  Imaging Dg Chest Portable 1 View  Result Date: 03/31/2017 CLINICAL DATA:  Intubation EXAM: PORTABLE CHEST 1 VIEW COMPARISON:  Chest radiograph 03/31/2017 at 2:18 a.m. FINDINGS: Endotracheal tube tip is 2 cm above the inferior margin of the carina. Retraction by 3 cm would put at the level of the clavicular heads. Orogastric tube side port is below the diaphragm. Unchanged cardiomegaly and calcific aortic atherosclerosis. Pulmonary edema has worsened. Small left pleural effusion. IMPRESSION: 1. Endotracheal tube tip is 2 cm above the carina and could be retracted by 3 cm for optimal positioning. 2. Worsening pulmonary edema. Unchanged cardiomegaly and calcific aortic atherosclerosis (ICD10-I70.0). Electronically Signed   By: Deatra Robinson M.D.   On: 03/31/2017 04:46   Dg Chest Portable 1 View  Result Date: 03/31/2017 CLINICAL DATA:  84 year old female with dyspnea and chest pain  EXAM: PORTABLE CHEST 1 VIEW COMPARISON:  03/20/2017, 09/06/2016 FINDINGS: Borderline cardiomegaly with aortic atherosclerosis. Peribronchial thickening as well as central vascular congestion and interstitial edema is noted. Subpleural scarring is seen in the left mid lung. Probable tiny right effusion. IMPRESSION: Cardiomegaly with central vascular congestion and interstitial edema and aortic atherosclerosis. Probable trace right effusion. Electronically Signed   By: Tollie Eth M.D.   On: 03/31/2017 03:03     STUDIES:  CXR 1/11 > interstitial edema.  CULTURES: Blood 1/11 >   ANTIBIOTICS: None.  SIGNIFICANT EVENTS: 1/11 > admit.  LINES/TUBES: ETT 1/11 >   DISCUSSION: 84 y.o. female admitted 1/11 with acute pulmonary edema, CHF exacerbation, hypertensive urgency.  Required intubation after failing BiPAP.   ASSESSMENT / PLAN:  PULMONARY A: Acute hypoxic respiratory failure. Acute pulmonary edema - failed BiPAP and later intubated.  Received 40mg  lasix in ED. P:   Full vent support. Wean as able. VAP prevention measures. SBT in AM if able. Will start lasix 20mg  BID for now and assess response (received 40mg  in ED). CXR in AM.  CARDIOVASCULAR A:  Hypertensive urgency - resolved after nitro paste and intubation. CHF with acute exacerbation. Hx NSTEMI, ICM (Echo from 02/05/17 with EF 45 - 50%, G2DD), HTN, HLD, DVT, CAD. P:  Monitor hemodynamics. Will start lasix 20mg  BID for now and assess response (received 40mg  in ED), goal negative balance. Strict I/O's. Hold preadmission ASA, clonidine, vytorin, lasix, bidil, bystolic, nitro.  RENAL A:   Mild hyperkalemia. AoCKD. P:   NS @ KVO. Repeat BMP now.  GASTROINTESTINAL A:   ? UGIB - blood tinged sputum from OGT. GI prophylaxis. Nutrition. P:   If continues, might need GI consult.  Hold off for now. SUP: Pantoprazole. NPO.  HEMATOLOGIC A:   Anemia - chronic.  Does have blood tinged sputum from OGT on  admit. VTE Prophylaxis. P:  Transfuse for Hgb < 7. SCD's only. CBC in AM.  INFECTIOUS A:   No indication of infection. P:   Monitor clinically.  ENDOCRINE A:   DM II.   P:   SSI. Hold preadmission [place].  NEUROLOGIC A:   Sedation due to mechanical ventilation. Hx TIA, CVA. P:   Sedation:  Propofol gtt / Fentanyl PRN. RASS goal: 0 to -1. Daily WUA.  Family updated: None available, daughter had just left ED before my arrival.  Interdisciplinary Family Meeting v Palliative Care Meeting:  Due by: 04/06/17.  CC time: 35 min.   Rutherford Guys, [place] [facility] [place] [facility] Pager: [phone]  or [phone] 03/31/2017, 5:22 AM

## 2017-03-31 NOTE — ED Triage Notes (Signed)
Patient with shortness of breath and chest pain.  The pain is worse when she takes a deep breath.  Patient states that she is having a hard time breathing upon arrival to ED.  She drops to 80% on RA and she is at 100% on NRB.  Patient has history of NSTEMI and CHF.  Pedal edema noted to bilateral legs with tenderness when being touched.  Patient using accessory muscles.  Breath sounds clear in uppers and rales in bases.

## 2017-03-31 NOTE — ED Notes (Signed)
Attempted report 

## 2017-03-31 NOTE — Progress Notes (Signed)
RT pulled ETT to 20 cm from 22cm per CCM.

## 2017-03-31 NOTE — Progress Notes (Signed)
  Echocardiogram 2D Echocardiogram has been performed.  Gizel Riedlinger G Siraj Dermody 03/31/2017, 3:05 PM

## 2017-03-31 NOTE — Progress Notes (Signed)
[place] AM Rounding Note (see H&P from 0600)   S:  RN reports sedation changed from propofol to fentanyl.  Run of PVC's.  Afebrile.     O: Blood pressure (!) 185/58, pulse (!) 35, temperature 97.6 F (36.4 C), temperature source Oral, resp. rate 16, height 5' (1.524 m), weight 104 lb (47.2 kg), SpO2 100 %.  General: elderly female in NAD on vent  HEENT: MM pink/moist, ETT Neuro: sedate, pupils 3mm CV: s1s2 rrr, no m/r/g PULM: even/non-labored, lungs bilaterally coarse rhonchi  ZO:XWRUGI:soft, non-tender, bsx4 active  Extremities: warm/dry, 1+ BLE edema  Skin: no rashes or lesions  Recent Labs  Lab 03/31/17 0202  HGB 10.0*  HCT 32.3*  WBC 7.6  PLT 259    Recent Labs  Lab 03/24/17 1352 03/31/17 0202  NA 140 140  K 5.2 5.6*  CL 108 107  CO2 22 24  GLUCOSE 173* 153*  BUN 57* 39*  CREATININE 2.11* 1.90*  CALCIUM 8.6 8.4*    Discussion:  84 y/o F admitted with c/o SOB and LE swelling.  Work up concerning for acute hypoxic respiratory failure in the setting of pulmonary edema.  She recently had an admission for the same but did not require intubation.   A: Acute Hypoxic Respiratory Failure  Acute Pulmonary Edema  Hypertensive Urgency  CHF with Acute Exacerbation  Hx NSTEMI, ICM (LVEF 45% 01/2017), Grade II DD, HTN, HLD, DVT, CAD Hyperkalemia  Acute on Chronic Kidney Disease ? UGIB Anemia  DM II  Sedation Needs Hx TIA, CVA  P: Continue PRVC support  Wean PEEP / FiO2 for sats > 90% Follow up CXR in am  Lab for am - CMP, CBC, BNP Follow troponin  Lasix 20 mg IV BID  Control BP  Follow cultures See H&P from 0600 for further details   Canary BrimBrandi Ollis, NP-C [facility] Pgr: [phone] or if no answer [phone] 03/31/2017, 8:11 AM

## 2017-03-31 NOTE — ED Notes (Signed)
Patient with multiple bowel movements prior to foley placement.  Patient cleaned multiple times with perispray, soap and water.

## 2017-03-31 NOTE — ED Provider Notes (Signed)
[facility] EMERGENCY DEPARTMENT Provider Note   CSN: [id number] Arrival date & time: 03/31/17  0151     History   Chief Complaint Chief Complaint  Patient presents with  . Shortness of Breath  . Chest Pain    HPI [name] is a 84 y.o. female.  Patient is an 84 year old female with past medical history of congestive heart failure.  She presents today for evaluation of difficulty breathing.  This has progressed over the past 2 days.  She reports increased swelling of her legs.  She denies any fevers, chills, or productive cough.  She does report some pressure in her chest.   The history is provided by the patient.  Shortness of Breath  This is a new problem. The average episode lasts 2 days. The problem occurs continuously.The problem has been rapidly worsening. Associated symptoms include chest pain. Pertinent negatives include no fever and no cough. She has tried nothing for the symptoms. Associated medical issues include heart failure.  Chest Pain   Associated symptoms include shortness of breath. Pertinent negatives include no cough and no fever.    Past Medical History:  Diagnosis Date  . Anemia   . Anxiety disorder   . Carotid artery occlusion   . Carotid bruit   . Chronic kidney disease (CKD), stage III (moderate) (HCC)   . Coronary artery disease    Remote PCI. Had BMS to RCA and DES stent to Diagonal in Jan. 2012   . Diastolic heart failure    EF 55 to 60% per cath in Jan 2012  . DVT (deep venous thrombosis) (HCC)   . Gout   . Hyperlipidemia   . Hypertension   . Ischemic cardiomyopathy    with prior EF of 35%  . NSTEMI (non-ST elevated myocardial infarction) [facility]) Jan 2012   with PCI to RCA and DX per Dr. Excell Seltzer  . Obesity   . Pneumonia 2016  . Proteinuria   . Stroke [facility]) 2013   ?  mini    . TIA (transient ischemic attack)   . Type II diabetes mellitus (HCC)    long standing    Patient Active Problem List   Diagnosis Date  Noted  . Respiratory failure (HCC) 03/31/2017  . Pressure injury of skin 03/21/2017  . Acute renal failure superimposed on stage 3 chronic kidney disease (HCC) 03/20/2017  . Acute on chronic combined systolic (congestive) and diastolic (congestive) heart failure (HCC) 03/20/2017  . CHF (congestive heart failure) (HCC) 03/09/2017  . Acute kidney injury superimposed on CKD (HCC)stage III 02/07/2017  . Acute on chronic combined systolic and diastolic CHF (congestive heart failure) (HCC) 02/05/2017  . Urinary tract infection without hematuria   . Hypoxia 02/04/2017  . Abnormal CXR 02/04/2017  . Accelerated hypertension 02/04/2017  . LBBB (left bundle branch block) 02/04/2017  . Diarrhea 12/09/2016  . Delusions (HCC) 09/28/2016  . Medication noncompliance due to cognitive impairment 09/28/2016  . Hemorrhoids 05/04/2016  . Pain in joint, lower leg 01/20/2016  . Ptosis 11/04/2015  . Glaucoma 08/25/2015  . Dementia without behavioral disturbance 06/10/2015  . Expressive aphasia 05/26/2015  . Rectal prolapse 02/25/2015  . CKD (chronic kidney disease), stage III (HCC) 02/10/2014  . PAD (peripheral artery disease) (HCC) 01/17/2014  . Constipation 07/31/2013  . Atherosclerosis of native artery of extremity with intermittent claudication (HCC) 07/04/2013  . Anxiety disorder 02/21/2013  . Diabetes mellitus with nephropathy (HCC) 07/05/2012  . Occlusion and stenosis of carotid artery without mention  of cerebral infarction 06/21/2012  . Coronary artery disease   . Chronic diastolic heart failure, NYHA class 1 (HCC)   . Hyperlipidemia   . Hypertension   . Carotid bruit   . Gout   . Obesity   . TIA (transient ischemic attack)     Past Surgical History:  Procedure Laterality Date  . ABDOMINAL AORTAGRAM N/A 01/17/2014   Procedure: ABDOMINAL Ronny Flurry;  Surgeon: Sherren Kerns, MD;  Location: [facility] CATH LAB;  Service: Cardiovascular;  Laterality: N/A;  . ANKLE FUSION Bilateral   . APPENDECTOMY     . CATARACT EXTRACTION, BILATERAL Bilateral   . CORONARY ANGIOPLASTY WITH STENT PLACEMENT  04/05/2010   distal RCA  . FRACTURE SURGERY Right 2005   bilateral ankles   . TONSILLECTOMY      OB History    No data available       Home Medications    Prior to Admission medications   Medication Sig Start Date End Date Taking? Authorizing Provider  aspirin EC 81 MG tablet Take 1 tablet (81 mg total) by mouth daily. 12/11/16  Yes Albertine Grates, MD  cloNIDine (CATAPRES) 0.1 MG tablet Take 1 tablet (0.1 mg total) by mouth 2 (two) times daily. 03/22/17  Yes Marinda Elk, MD  docusate sodium (COLACE) 100 MG capsule Take 1 capsule (100 mg total) by mouth every 12 (twelve) hours. 01/09/17  Yes Ward, Layla Maw, DO  ezetimibe-simvastatin (VYTORIN) 10-20 MG tablet TAKE 1/2 TABLET BY MOUTH ONCE NIGHTLY TO CONTROL CHOLESTEROL 01/19/17  Yes Montez Morita, [place], DO  feeding supplement, ENSURE ENLIVE, (ENSURE ENLIVE) LIQD Take 237 mLs by mouth 2 (two) times daily between meals. 12/11/16  Yes Albertine Grates, MD  furosemide (LASIX) 20 MG tablet TAKE ONE TABLET BY MOUTH TWICE DAILY FOR HYPERTENSION 03/24/17  Yes Montez Morita, Monica, DO  hydrocortisone (PROCTOZONE-HC) 2.5 % rectal cream APPLY TO RECTUM THREE TIMES A DAY FOR DISCOMFORT 03/24/17  Yes Montez Morita, Monica, DO  isosorbide-hydrALAZINE (BIDIL) 20-37.5 MG tablet Take 1 tablet by mouth 2 (two) times daily. 03/22/17  Yes Marinda Elk, MD  JANUVIA 100 MG tablet Take 1 tablet (100 mg total) by mouth daily. 02/16/17  Yes Kruse, Monica, DO  LOTEMAX 0.5 % GEL PLACE 1 DROP IN THE LEFT EYE TWICE DAILY 03/24/17  Yes Kirt Boys, DO  Nebivolol HCl (BYSTOLIC) 20 MG TABS Take 1 tablet (20 mg total) by mouth daily. For Blood Pressure 03/22/17  Yes Marinda Elk, MD  nitroGLYCERIN (NITROSTAT) 0.4 MG SL tablet Place one under tongue for chest pain, up to 3 tablets 03/24/17  Yes Montez Morita, Monica, DO  polyethylene glycol powder (GLYCOLAX/MIRALAX) powder Take 17 g by mouth daily as  needed for mild constipation.  01/16/17  Yes [provider]  timolol (BETIMOL) 0.5 % ophthalmic solution INSTILL 1 DROP INTO THE LEFT EYE DAILY 03/24/17  Yes Kirt Boys, DO    Family History Family History  Problem Relation Age of Onset  . Diabetes Daughter   . CAD Neg Hx     Social History Social History   Tobacco Use  . Smoking status: Former Games developer  . Smokeless tobacco: Never Used  . Tobacco comment: "smoked in my teens; quit after 1 year or so"  Substance Use Topics  . Alcohol use: No    Alcohol/week: 0.0 oz  . Drug use: No     Allergies   Namzaric [memantine hcl-donepezil hcl]; Percocet [oxycodone-acetaminophen]; Sulfa antibiotics; Adhesive [tape]; Metformin and related; Tradjenta [linagliptin]; and Penicillins   Review  of Systems Review of Systems  Constitutional: Negative for fever.  Respiratory: Positive for shortness of breath. Negative for cough.   Cardiovascular: Positive for chest pain.  All other systems reviewed and are negative.    Physical Exam Updated Vital Signs BP (!) 143/38   Pulse 61   Temp 98 F (36.7 C) (Oral)   Resp 17   Ht 5' (1.524 m)   Wt 47.2 kg (104 lb)   SpO2 100%   BMI 20.31 kg/m   Physical Exam  Constitutional: She is oriented to person, place, and time. She appears well-developed and well-nourished. No distress.  HENT:  Head: Normocephalic and atraumatic.  Neck: Normal range of motion. Neck supple.  Cardiovascular: Normal rate and regular rhythm. Exam reveals no gallop and no friction rub.  No murmur heard. Pulmonary/Chest: Effort normal. No respiratory distress. She has no wheezes. She has rales in the right lower field and the left lower field.  Abdominal: Soft. Bowel sounds are normal. She exhibits no distension. There is no tenderness.  Musculoskeletal: Normal range of motion.       Right lower leg: She exhibits edema.       Left lower leg: She exhibits edema.  Neurological: She is alert and oriented to  person, place, and time.  Skin: Skin is warm and dry. She is not diaphoretic.  Nursing note and vitals reviewed.    ED Treatments / Results  Labs (all labs ordered are listed, but only abnormal results are displayed) Labs Reviewed  BASIC METABOLIC PANEL - Abnormal; Notable for the following components:      Result Value   Potassium 5.6 (*)    Glucose, Bld 153 (*)    BUN 39 (*)    Creatinine, Ser 1.90 (*)    Calcium 8.4 (*)    GFR calc non Af Amer 23 (*)    GFR calc Af Amer 26 (*)    All other components within normal limits  CBC - Abnormal; Notable for the following components:   RBC 3.40 (*)    Hemoglobin 10.0 (*)    HCT 32.3 (*)    RDW 15.7 (*)    All other components within normal limits  BRAIN NATRIURETIC PEPTIDE - Abnormal; Notable for the following components:   B Natriuretic Peptide 1,499.8 (*)    All other components within normal limits  CULTURE, BLOOD (ROUTINE X 2)  CULTURE, BLOOD (ROUTINE X 2)  URINE CULTURE  URINALYSIS, ROUTINE W REFLEX MICROSCOPIC  TRIGLYCERIDES  I-STAT TROPONIN, ED    EKG  EKG Interpretation  Date/Time:  Friday March 31 2017 01:59:26 EST Ventricular Rate:  77 PR Interval:    QRS Duration: 175 QT Interval:  458 QTC Calculation: 519 R Axis:   -122 Text Interpretation:  Sinus rhythm Nonspecific intraventricular conduction delay Confirmed by Geoffery Lyons ([id number]) on 03/31/2017 3:30:34 AM       Radiology Dg Chest Portable 1 View  Result Date: 03/31/2017 CLINICAL DATA:  Intubation EXAM: PORTABLE CHEST 1 VIEW COMPARISON:  Chest radiograph 03/31/2017 at 2:18 a.m. FINDINGS: Endotracheal tube tip is 2 cm above the inferior margin of the carina. Retraction by 3 cm would put at the level of the clavicular heads. Orogastric tube side port is below the diaphragm. Unchanged cardiomegaly and calcific aortic atherosclerosis. Pulmonary edema has worsened. Small left pleural effusion. IMPRESSION: 1. Endotracheal tube tip is 2 cm above the carina and  could be retracted by 3 cm for optimal positioning. 2. Worsening pulmonary edema. Unchanged cardiomegaly and  calcific aortic atherosclerosis (ICD10-I70.0). Electronically Signed   By: Deatra RobinsonKevin  Herman M.D.   On: 03/31/2017 04:46   Dg Chest Portable 1 View  Result Date: 03/31/2017 CLINICAL DATA:  84 year old female with dyspnea and chest pain EXAM: PORTABLE CHEST 1 VIEW COMPARISON:  03/20/2017, 09/06/2016 FINDINGS: Borderline cardiomegaly with aortic atherosclerosis. Peribronchial thickening as well as central vascular congestion and interstitial edema is noted. Subpleural scarring is seen in the left mid lung. Probable tiny right effusion. IMPRESSION: Cardiomegaly with central vascular congestion and interstitial edema and aortic atherosclerosis. Probable trace right effusion. Electronically Signed   By: Tollie Ethavid  Kwon M.D.   On: 03/31/2017 03:03    Procedures Procedures (including critical care time)  Medications Ordered in ED Medications  pantoprazole (PROTONIX) injection 40 mg (not administered)  0.9 %  sodium chloride infusion (not administered)  0.9 %  sodium chloride infusion (not administered)  propofol (DIPRIVAN) 1000 MG/100ML infusion (not administered)  fentaNYL (SUBLIMAZE) injection 50 mcg (not administered)  fentaNYL (SUBLIMAZE) injection 50 mcg (not administered)  insulin aspart (novoLOG) injection 0-9 Units (not administered)  furosemide (LASIX) injection 40 mg (40 mg Intravenous Given 03/31/17 0245)  nitroGLYCERIN (NITROGLYN) 2 % ointment 1 inch (1 inch Topical Given 03/31/17 0305)  LORazepam (ATIVAN) injection 0.5 mg (0.5 mg Intravenous Given 03/31/17 0309)  propofol (DIPRIVAN) 1000 MG/100ML infusion (30 mcg/kg/min  Rate/Dose Change 03/31/17 0455)  etomidate (AMIDATE) injection 10 mg (10 mg Intravenous Given 03/31/17 0417)  succinylcholine (ANECTINE) injection 150 mg (150 mg Intravenous Given 03/31/17 0418)     Initial Impression / Assessment and Plan / ED Course  I have reviewed the  triage vital signs and the nursing notes.  Pertinent labs & imaging results that were available during my care of the patient were reviewed by me and considered in my medical decision making (see chart for details).  Patient presents here with shortness of breath related to an exacerbation of congestive heart failure.  She was given Lasix, nitroglycerin, and eventually started on BiPAP.  This has not helped and she continues in respiratory distress.  She remains markedly hypertensive.  As she has not responded to the above treatments and her respiratory status has significantly worsened, I feel as though she will require intubation.  RSI was performed using etomidate and succinylcholine.  A 7.5 endotracheal tube was easily passed using the glide scope.  Tube placement was confirmed with direct visualization, end-tidal CO2, and auscultation over the chest and stomach.  I have spoken with Dr. Craige CottaSood from critical care who agrees to admit.  CRITICAL CARE Performed by: Geoffery Lyonsouglas Raphael Fitzpatrick Total critical care time: 45 minutes Critical care time was exclusive of separately billable procedures and treating other patients. Critical care was necessary to treat or prevent imminent or life-threatening deterioration. Critical care was time spent personally by me on the following activities: development of treatment plan with patient and/or surrogate as well as nursing, discussions with consultants, evaluation of patient's response to treatment, examination of patient, obtaining history from patient or surrogate, ordering and performing treatments and interventions, ordering and review of laboratory studies, ordering and review of radiographic studies, pulse oximetry and re-evaluation of patient's condition.   Final Clinical Impressions(s) / ED Diagnoses   Final diagnoses:  None    ED Discharge Orders    None       Geoffery Lyonselo, Rebbeca Sheperd, MD 03/31/17 [id number]

## 2017-03-31 NOTE — Progress Notes (Signed)
Initial Nutrition Assessment  DOCUMENTATION CODES:   Severe malnutrition in context of chronic illness  INTERVENTION:    Vital AF 1.2 at 30 ml/h (720 ml per day)  Pro-stat 30 ml BID  Provides 1064 kcal, 84 gm protein, 584 ml free water daily  NUTRITION DIAGNOSIS:   Severe Malnutrition related to chronic illness(rectal prolapse, CHF, CKD, dementia) as evidenced by energy intake < or equal to 75% for > or equal to 1 month, percent weight loss(15% weight loss in 1 month).  GOAL:   Patient will meet greater than or equal to 90% of their needs  MONITOR:   Vent status, TF tolerance, Labs, I & O's, Skin  REASON FOR ASSESSMENT:   Ventilator, Consult(verbal) Enteral/tube feeding initiation and management  ASSESSMENT:   84 yo female with PMH of DM, HTN, CHF, CKD, dementia, rectal prolapse who was admitted on 1/11 with hypoxic respiratory failure requiring intubation.   Discussed patient in ICU rounds and with RN today. Hopeful to extubate Saturday. RD to order TF to start today.    Patient's daughter at bedside reports that patient has been eating very poorly for the past 6-8 months. She is scared to eat due to recent GI issues (constipation, rectal prolapse). From discussion with daughter, patient's intake has been </= 75% of estimated energy requirement for >/= 1 month. 15% weight loss within the past month is significant.  Patient is currently intubated on ventilator support MV: 7 L/min Temp (24hrs), Avg:97.8 F (36.6 C), Min:97.2 F (36.2 C), Max:98.6 F (37 C)  Propofol: none   NUTRITION - FOCUSED PHYSICAL EXAM:    Most Recent Value  Orbital Region  No depletion  Upper Arm Region  No depletion  Thoracic and Lumbar Region  Unable to assess  Buccal Region  Unable to assess  Temple Region  Mild depletion  Clavicle Bone Region  Mild depletion  Clavicle and Acromion Bone Region  No depletion  Scapular Bone Region  Unable to assess  Dorsal Hand  Unable to assess   Patellar Region  Mild depletion  Anterior Thigh Region  Mild depletion  Posterior Calf Region  Mild depletion  Edema (RD Assessment)  Mild  Hair  Reviewed  Eyes  Unable to assess  Mouth  Unable to assess  Skin  Reviewed  Nails  Unable to assess       Diet Order:  Diet NPO time specified  EDUCATION NEEDS:   No education needs have been identified at this time  Skin:  Skin Assessment: Skin Integrity Issues: Skin Integrity Issues:: Stage II Stage II: buttocks & sacrum  Last BM:  1/11  Height:   Ht Readings from Last 1 Encounters:  03/31/17 5' (1.524 m)    Weight:   Wt Readings from Last 1 Encounters:  03/31/17 104 lb (47.2 kg)    Ideal Body Weight:  45.5 kg  BMI:  Body mass index is 20.31 kg/m.  Estimated Nutritional Needs:   Kcal:  1000  Protein:  70-80 gm  Fluid:  1.5 L   Joaquin CourtsKimberly Laqueisha Catalina, RD, LDN, CNSC Pager [phone] After Hours Pager [phone]

## 2017-03-31 NOTE — Procedures (Signed)
Intubation Procedure Note VERITY GILCREST [id number] [birth year]  Procedure: Intubation Indications: Respiratory insufficiency  Procedure Details Consent: Risks of procedure as well as the alternatives and risks of each were explained to the (patient/caregiver).  Consent for procedure obtained. Time Out: Verified patient identification, verified procedure, site/side was marked, verified correct patient position, special equipment/implants available, medications/allergies/relevent history reviewed, required imaging and test results available.  Performed  Maximum sterile technique was used including gloves and hand hygiene.  MAC and 3    Evaluation Hemodynamic Status: BP stable throughout; O2 sats: stable throughout Patient's Current Condition: stable Complications: No apparent complications Patient did tolerate procedure well. Chest X-ray ordered to verify placement.  CXR: tube position acceptable.   Vivien Rossetti 03/31/2017

## 2017-04-01 ENCOUNTER — Inpatient Hospital Stay (HOSPITAL_COMMUNITY): Payer: Medicare Other

## 2017-04-01 DIAGNOSIS — G9340 Encephalopathy, unspecified: Secondary | ICD-10-CM

## 2017-04-01 DIAGNOSIS — E43 Unspecified severe protein-calorie malnutrition: Secondary | ICD-10-CM

## 2017-04-01 DIAGNOSIS — J810 Acute pulmonary edema: Secondary | ICD-10-CM

## 2017-04-01 DIAGNOSIS — I10 Essential (primary) hypertension: Secondary | ICD-10-CM

## 2017-04-01 LAB — BLOOD GAS, ARTERIAL
Acid-Base Excess: 0.4 mmol/L (ref 0.0–2.0)
BICARBONATE: 23.8 mmol/L (ref 20.0–28.0)
DRAWN BY: 41422
FIO2: 21
O2 Saturation: 98.8 %
PATIENT TEMPERATURE: 98.6
PH ART: 7.463 — AB (ref 7.350–7.450)
pCO2 arterial: 33.7 mmHg (ref 32.0–48.0)
pO2, Arterial: 119 mmHg — ABNORMAL HIGH (ref 83.0–108.0)

## 2017-04-01 LAB — URINE CULTURE: CULTURE: NO GROWTH

## 2017-04-01 LAB — COMPREHENSIVE METABOLIC PANEL
ALK PHOS: 46 U/L (ref 38–126)
ALT: 8 U/L — ABNORMAL LOW (ref 14–54)
ANION GAP: 10 (ref 5–15)
AST: 15 U/L (ref 15–41)
Albumin: 2.7 g/dL — ABNORMAL LOW (ref 3.5–5.0)
BUN: 46 mg/dL — ABNORMAL HIGH (ref 6–20)
CALCIUM: 8.1 mg/dL — AB (ref 8.9–10.3)
CO2: 20 mmol/L — AB (ref 22–32)
Chloride: 110 mmol/L (ref 101–111)
Creatinine, Ser: 2.25 mg/dL — ABNORMAL HIGH (ref 0.44–1.00)
GFR calc non Af Amer: 18 mL/min — ABNORMAL LOW (ref >60)
GFR, EST AFRICAN AMERICAN: 21 mL/min — AB (ref >60)
Glucose, Bld: 138 mg/dL — ABNORMAL HIGH (ref 65–99)
POTASSIUM: 4 mmol/L (ref 3.5–5.1)
SODIUM: 140 mmol/L (ref 135–145)
Total Bilirubin: 0.6 mg/dL (ref 0.3–1.2)
Total Protein: 5.2 g/dL — ABNORMAL LOW (ref 6.5–8.1)

## 2017-04-01 LAB — GLUCOSE, CAPILLARY
GLUCOSE-CAPILLARY: 154 mg/dL — AB (ref 65–99)
GLUCOSE-CAPILLARY: 191 mg/dL — AB (ref 65–99)
GLUCOSE-CAPILLARY: 96 mg/dL (ref 65–99)
Glucose-Capillary: 135 mg/dL — ABNORMAL HIGH (ref 65–99)
Glucose-Capillary: 167 mg/dL — ABNORMAL HIGH (ref 65–99)
Glucose-Capillary: 133 mg/dL — ABNORMAL HIGH (ref 65–99)

## 2017-04-01 LAB — PHOSPHORUS: PHOSPHORUS: 4 mg/dL (ref 2.5–4.6)

## 2017-04-01 LAB — CBC
HEMATOCRIT: 27.9 % — AB (ref 36.0–46.0)
HEMOGLOBIN: 8.6 g/dL — AB (ref 12.0–15.0)
MCH: 29.1 pg (ref 26.0–34.0)
MCHC: 30.8 g/dL (ref 30.0–36.0)
MCV: 94.3 fL (ref 78.0–100.0)
Platelets: 180 10*3/uL (ref 150–400)
RBC: 2.96 MIL/uL — ABNORMAL LOW (ref 3.87–5.11)
RDW: 15.8 % — ABNORMAL HIGH (ref 11.5–15.5)
WBC: 7.2 10*3/uL (ref 4.0–10.5)

## 2017-04-01 LAB — MAGNESIUM: MAGNESIUM: 2.2 mg/dL (ref 1.7–2.4)

## 2017-04-01 LAB — BRAIN NATRIURETIC PEPTIDE: B NATRIURETIC PEPTIDE 5: 783.3 pg/mL — AB (ref 0.0–100.0)

## 2017-04-01 MED ORDER — SODIUM CHLORIDE 0.9 % IV SOLN
0.4000 ug/kg/h | INTRAVENOUS | Status: DC
  Administered 2017-04-01: 0.6 ug/kg/h via INTRAVENOUS
  Filled 2017-04-01: qty 2

## 2017-04-01 MED ORDER — DILTIAZEM HCL 100 MG IV SOLR
5.0000 mg/h | INTRAVENOUS | Status: DC
  Administered 2017-04-02: 5 mg/h via INTRAVENOUS
  Filled 2017-04-01: qty 100

## 2017-04-01 MED ORDER — ORAL CARE MOUTH RINSE
15.0000 mL | Freq: Two times a day (BID) | OROMUCOSAL | Status: DC
  Administered 2017-04-01 – 2017-04-02 (×2): 15 mL via OROMUCOSAL

## 2017-04-01 MED ORDER — FUROSEMIDE 10 MG/ML IJ SOLN
40.0000 mg | Freq: Three times a day (TID) | INTRAMUSCULAR | Status: AC
  Administered 2017-04-01 (×2): 40 mg via INTRAVENOUS
  Filled 2017-04-01 (×2): qty 4

## 2017-04-01 NOTE — Progress Notes (Signed)
Patient self extubated.  She was immediatly placed on 4L of O2 and appears to not be in any distress.  Will continue to monitor patient's vitals.  Dr. Jeannie FendY was notified and we were ordered to monitor patient's status

## 2017-04-01 NOTE — Progress Notes (Addendum)
Pt's HR 110's-140's. EKG shows Afib with RVR. Notified Dr. Arsenio LoaderSommer. Order for BMP, Mg, and Cardizem gtt.

## 2017-04-01 NOTE — Progress Notes (Signed)
eLink Physician-Brief Progress Note Patient Name: Ashley RheinDoris E Mcilrath DOB: [birth year] MRN: [id number]   Date of Service  04/01/2017  HPI/Events of Note  AFIB with RVR - Ventricular rate = 128. BP = 130/101. LVEF = 40% to 45%.  eICU Interventions  Will order: 1. BMP and Mg++ level STAT. 2. Cardizem IV infusion. Titrate to HR = 65-105.      Intervention Category Major Interventions: Arrhythmia - evaluation and management  Sommer,Steven Eugene 04/01/2017, 11:45 PM

## 2017-04-01 NOTE — Progress Notes (Signed)
[place] AM Rounding Note (see H&P from 0600)   S:  No events overnight, minimum sedation  O: Blood pressure (!) 185/58, pulse (!) 35, temperature 97.6 F (36.4 C), temperature source Oral, resp. rate 16, height 5' (1.524 m), weight 104 lb (47.2 kg), SpO2 100 %.  General: Elderly acutely ill female, NAD, arousable HEENT: [place]/AT, PERRL, EOM-I and MMM Neuro: Sedate but easily arousable, moving all ext to command CV: RRR, NL S1/S2 and -M/R/G. PULM: Bibasilar crackles GI: Soft, NT, ND and +BS Extremities: 1+ edema Skin: no rashes or lesions  Recent Labs  Lab 03/31/17 0202 04/01/17 0450  HGB 10.0* 8.6*  HCT 32.3* 27.9*  WBC 7.6 7.2  PLT 259 180    Recent Labs  Lab 03/31/17 0202 03/31/17 0741 04/01/17 0450  NA 140 140 140  K 5.6* 4.8 4.0  CL 107 109 110  CO2 24 20* 20*  GLUCOSE 153* 180* 138*  BUN 39* 38* 46*  CREATININE 1.90* 2.03* 2.25*  CALCIUM 8.4* 8.4* 8.1*  MG  --   --  2.2  PHOS  --   --  4.0   Discussion:  84 y/o F admitted with c/o SOB and LE swelling.  Work up concerning for acute hypoxic respiratory failure in the setting of pulmonary edema.  She recently had an admission for the same but did not require intubation.   A: Acute Hypoxic Respiratory Failure  Acute Pulmonary Edema  Hypertensive Urgency  CHF with Acute Exacerbation  Hx NSTEMI, ICM (LVEF 45% 01/2017), Grade II DD, HTN, HLD, DVT, CAD Hyperkalemia - resolved Acute on Chronic Kidney Disease ? UGIB Anemia  DM II  Sedation Needs Hx TIA, CVA  P: Begin PS trials, if does well will consider extubation pending mental status Will give 2 doses of 40 mg IV lasix Wean PEEP / FiO2 for sats > 90% Follow up CXR in am  Lab for am - CMP, CBC, BNP Control BP  Follow cultures Will attempt extubation today if patient wakes up more Minimize sedation for potential extubation   The patient is critically ill with multiple organ systems failure and requires high complexity decision making for assessment and  support, frequent evaluation and titration of therapies, application of advanced monitoring technologies and extensive interpretation of multiple databases.   Critical Care Time devoted to patient care services described in this note is  35  Minutes. This time reflects time of care of this signee Dr Koren BoundWesam Yacoub. This critical care time does not reflect procedure time, or teaching time or supervisory time of PA/NP/Med student/Med Resident etc but could involve care discussion time.  Alyson ReedyWesam G. Yacoub, M.D. [facility]. Pager: [phone]. After hours pager: [phone].  04/01/2017, 11:55 AM

## 2017-04-01 NOTE — Progress Notes (Signed)
Wasted 50 mL of fentanyl down sink.  [name] witnessed the waste.

## 2017-04-01 NOTE — Procedures (Signed)
Extubation Procedure Note  Patient Details:   Name: [name] DOB: December 25, 1927 MRN: [id number]     Pt self extubated at this time.  Pt placed on 4L [place], tolerating well.  No distress noted at this time.  Pt able to voice.   Cherylin MylarDoyle, Roye Gustafson 04/01/2017, 3:30 PM

## 2017-04-02 ENCOUNTER — Inpatient Hospital Stay (HOSPITAL_COMMUNITY): Payer: Medicare Other

## 2017-04-02 ENCOUNTER — Other Ambulatory Visit: Payer: Self-pay

## 2017-04-02 DIAGNOSIS — R0902 Hypoxemia: Secondary | ICD-10-CM

## 2017-04-02 DIAGNOSIS — I4891 Unspecified atrial fibrillation: Secondary | ICD-10-CM

## 2017-04-02 LAB — GLUCOSE, CAPILLARY
GLUCOSE-CAPILLARY: 185 mg/dL — AB (ref 65–99)
GLUCOSE-CAPILLARY: 234 mg/dL — AB (ref 65–99)
GLUCOSE-CAPILLARY: 235 mg/dL — AB (ref 65–99)
GLUCOSE-CAPILLARY: 94 mg/dL (ref 65–99)
Glucose-Capillary: 101 mg/dL — ABNORMAL HIGH (ref 65–99)
Glucose-Capillary: 262 mg/dL — ABNORMAL HIGH (ref 65–99)

## 2017-04-02 LAB — BASIC METABOLIC PANEL
ANION GAP: 14 (ref 5–15)
Anion gap: 13 (ref 5–15)
BUN: 52 mg/dL — AB (ref 6–20)
BUN: 56 mg/dL — ABNORMAL HIGH (ref 6–20)
CALCIUM: 8.8 mg/dL — AB (ref 8.9–10.3)
CO2: 20 mmol/L — ABNORMAL LOW (ref 22–32)
CO2: 21 mmol/L — AB (ref 22–32)
CREATININE: 2.32 mg/dL — AB (ref 0.44–1.00)
Calcium: 8.7 mg/dL — ABNORMAL LOW (ref 8.9–10.3)
Chloride: 108 mmol/L (ref 101–111)
Chloride: 109 mmol/L (ref 101–111)
Creatinine, Ser: 2.32 mg/dL — ABNORMAL HIGH (ref 0.44–1.00)
GFR calc Af Amer: 21 mL/min — ABNORMAL LOW (ref >60)
GFR calc Af Amer: 21 mL/min — ABNORMAL LOW (ref >60)
GFR calc non Af Amer: 18 mL/min — ABNORMAL LOW (ref >60)
GFR, EST NON AFRICAN AMERICAN: 18 mL/min — AB (ref >60)
Glucose, Bld: 154 mg/dL — ABNORMAL HIGH (ref 65–99)
Glucose, Bld: 87 mg/dL (ref 65–99)
Potassium: 3.9 mmol/L (ref 3.5–5.1)
Potassium: 3.9 mmol/L (ref 3.5–5.1)
SODIUM: 142 mmol/L (ref 135–145)
SODIUM: 143 mmol/L (ref 135–145)

## 2017-04-02 LAB — CBC
HEMATOCRIT: 29.1 % — AB (ref 36.0–46.0)
HEMOGLOBIN: 8.8 g/dL — AB (ref 12.0–15.0)
MCH: 29.1 pg (ref 26.0–34.0)
MCHC: 30.2 g/dL (ref 30.0–36.0)
MCV: 96.4 fL (ref 78.0–100.0)
PLATELETS: 170 10*3/uL (ref 150–400)
RBC: 3.02 MIL/uL — AB (ref 3.87–5.11)
RDW: 15.7 % — ABNORMAL HIGH (ref 11.5–15.5)
WBC: 6.1 10*3/uL (ref 4.0–10.5)

## 2017-04-02 LAB — MAGNESIUM
Magnesium: 2.4 mg/dL (ref 1.7–2.4)
Magnesium: 2.4 mg/dL (ref 1.7–2.4)

## 2017-04-02 LAB — PHOSPHORUS: Phosphorus: 5.5 mg/dL — ABNORMAL HIGH (ref 2.5–4.6)

## 2017-04-02 MED ORDER — DILTIAZEM HCL ER 60 MG PO CP12
60.0000 mg | ORAL_CAPSULE | Freq: Two times a day (BID) | ORAL | Status: DC
  Administered 2017-04-02: 60 mg via ORAL
  Filled 2017-04-02 (×3): qty 1

## 2017-04-02 MED ORDER — FUROSEMIDE 10 MG/ML IJ SOLN
120.0000 mg | Freq: Once | INTRAVENOUS | Status: AC
  Administered 2017-04-02: 120 mg via INTRAVENOUS
  Filled 2017-04-02: qty 12

## 2017-04-02 MED ORDER — ISOSORB DINITRATE-HYDRALAZINE 20-37.5 MG PO TABS
1.0000 | ORAL_TABLET | Freq: Two times a day (BID) | ORAL | Status: DC
  Administered 2017-04-02 – 2017-04-07 (×10): 1 via ORAL
  Filled 2017-04-02 (×11): qty 1

## 2017-04-02 MED ORDER — FUROSEMIDE 10 MG/ML IJ SOLN
80.0000 mg | Freq: Two times a day (BID) | INTRAMUSCULAR | Status: DC
  Administered 2017-04-02: 80 mg via INTRAVENOUS
  Filled 2017-04-02 (×2): qty 8

## 2017-04-02 MED ORDER — DOCUSATE SODIUM 100 MG PO CAPS
100.0000 mg | ORAL_CAPSULE | Freq: Two times a day (BID) | ORAL | Status: DC
  Administered 2017-04-02 – 2017-04-04 (×5): 100 mg via ORAL
  Filled 2017-04-02 (×7): qty 1

## 2017-04-02 MED ORDER — DILTIAZEM HCL ER 90 MG PO CP12
90.0000 mg | ORAL_CAPSULE | Freq: Two times a day (BID) | ORAL | Status: DC
  Filled 2017-04-02: qty 1

## 2017-04-02 MED ORDER — POTASSIUM CHLORIDE CRYS ER 20 MEQ PO TBCR: 40.0000 meq | EXTENDED_RELEASE_TABLET | Freq: Once | ORAL | Status: DC

## 2017-04-02 NOTE — Plan of Care (Signed)
  Clinical Measurements: Respiratory complications will improve 04/02/2017 2051 - Progressing by Luther Redourgott, Darlyne Schmiesing, RN   Clinical Measurements: Ability to maintain clinical measurements within normal limits will improve 04/02/2017 2051 - Progressing by Luther Redourgott, Aubrynn Katona, RN

## 2017-04-02 NOTE — Progress Notes (Signed)
Pt arrived on the unit from 49M in wheelchair accompanied by nurse from the unit. Placed in chair at bedside with chair alarm. Telemetry box #2 applied with cont. Pulse ox. Belongings put in closet. Pt oriented to room very confused stating someone was going to come and get her and to please not leave her. Speaks of her daughters Bonita QuinLinda and Arna Mediciora and her friend Annette StableBill. 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 Attempted to start IV lasix however pt did not want to "take any medicine" Night nurse will continue to try to get patient to accept this one time dose. Will continue to monitor pt as needed. Reported off to night nurse.

## 2017-04-02 NOTE — Progress Notes (Signed)
PULMONARY / CRITICAL CARE MEDICINE   Name: [name] MRN: [id number] DOB: 05/25/1927    ADMISSION DATE:  03/31/2017 CONSULTATION DATE:  03/31/17  REFERRING MD:  Judd Lien   CHIEF COMPLAINT:  SOB  HISTORY OF PRESENT ILLNESS:   [name] is a 84 y.o. female with PMH as outlined below. She presented to [facility] ED 03/31/17 with SOB and chest pain.  In ED, she had SpO2 in 80s on room air and she was found to have signs of volume overload.  She also had hypertensive urgency with BP of 180 / 100.  CXR demonstrated interstitial edema and BNP was 1,500.She was started on BiPAP for increased WOB; however, she failed and ultimately required intubation.  Daughter informed ED RN that symptoms started the day prior and progressed.  No fevers/chills/sweats, cough, N/V/D, abd pain, myalgias.  Has had increasing LE edema.  On chart review she has had two similar admissions in the last month or so, her most recent admission 03/19/17 through 03/22/17  did not require intubation (CHF exacerbation, acute hypoxic respiratory failure).    PAST MEDICAL HISTORY :  She  has a past medical history of Anemia, Anxiety disorder, Carotid artery occlusion, Carotid bruit, Chronic kidney disease (CKD), stage III (moderate) (HCC), Coronary artery disease, Diastolic heart failure, DVT (deep venous thrombosis) (HCC), Gout, Hyperlipidemia, Hypertension, Ischemic cardiomyopathy, NSTEMI (non-ST elevated myocardial infarction) [facility]) (Jan 2012), Obesity, Pneumonia (2016), Proteinuria, Stroke (HCC) (2013), TIA (transient ischemic attack), and Type II diabetes mellitus (HCC).  PAST SURGICAL HISTORY: She  has a past surgical history that includes Appendectomy; Tonsillectomy; Ankle Fusion (Bilateral); Coronary angioplasty with stent (04/05/2010); Fracture surgery (Right, 2005); abdominal aortagram (N/A, 01/17/2014); and Cataract extraction, bilateral (Bilateral).  Allergies  Allergen Reactions  . Namzaric [Memantine Hcl-Donepezil Hcl] Nausea  Only and Other (See Comments)    confusion  . Percocet [Oxycodone-Acetaminophen] Other (See Comments)    loosy goosy  . Sulfa Antibiotics Other (See Comments)    Tongue swells  . Adhesive [Tape] Other (See Comments)    Tears skin.  Please use "paper" tape  . Metformin And Related Diarrhea and Other (See Comments)    Abnormal Kidney Functions   . Tradjenta [Linagliptin] Other (See Comments)    Low back ache, resolved once medication stopped   . Penicillins Hives and Other (See Comments)    Tolerates Ceftriaxone, Has patient had a PCN reaction causing immediate rash, facial/tongue/throat swelling, SOB or lightheadedness with hypotension: Unknown Has patient had a PCN reaction causing severe rash involving mucus membranes or skin necrosis: No Has patient had a PCN reaction that required hospitalization No Has patient had a PCN reaction occurring within the last 10 years: No If all of the above answers are "NO", then may proceed with Cephalosporin use.     No current facility-administered medications on file prior to encounter.    Current Outpatient Medications on File Prior to Encounter  Medication Sig  . aspirin EC 81 MG tablet Take 1 tablet (81 mg total) by mouth daily.  . cloNIDine (CATAPRES) 0.1 MG tablet Take 1 tablet (0.1 mg total) by mouth 2 (two) times daily.  Marland Kitchen docusate sodium (COLACE) 100 MG capsule Take 1 capsule (100 mg total) by mouth every 12 (twelve) hours.  Marland Kitchen ezetimibe-simvastatin (VYTORIN) 10-20 MG tablet TAKE 1/2 TABLET BY MOUTH ONCE NIGHTLY TO CONTROL CHOLESTEROL  . feeding supplement, ENSURE ENLIVE, (ENSURE ENLIVE) LIQD Take 237 mLs by mouth 2 (two) times daily between meals.  . furosemide (LASIX) 20 MG  tablet TAKE ONE TABLET BY MOUTH TWICE DAILY FOR HYPERTENSION  . hydrocortisone (PROCTOZONE-HC) 2.5 % rectal cream APPLY TO RECTUM THREE TIMES A DAY FOR DISCOMFORT  . isosorbide-hydrALAZINE (BIDIL) 20-37.5 MG tablet Take 1 tablet by mouth 2 (two) times daily.  Marland Kitchen  JANUVIA 100 MG tablet Take 1 tablet (100 mg total) by mouth daily.  Marland Kitchen LOTEMAX 0.5 % GEL PLACE 1 DROP IN THE LEFT EYE TWICE DAILY  . Nebivolol HCl (BYSTOLIC) 20 MG TABS Take 1 tablet (20 mg total) by mouth daily. For Blood Pressure  . nitroGLYCERIN (NITROSTAT) 0.4 MG SL tablet Place one under tongue for chest pain, up to 3 tablets  . polyethylene glycol powder (GLYCOLAX/MIRALAX) powder Take 17 g by mouth daily as needed for mild constipation.   . timolol (BETIMOL) 0.5 % ophthalmic solution INSTILL 1 DROP INTO THE LEFT EYE DAILY    FAMILY HISTORY:  Her indicated that her mother is deceased. She indicated that her father is deceased. She indicated that her sister is deceased. She indicated that her maternal grandmother is deceased. She indicated that her maternal grandfather is deceased. She indicated that her paternal grandmother is deceased. She indicated that her paternal grandfather is deceased. She indicated that both of her daughters are alive. She indicated that the status of her neg hx is unknown.   SOCIAL HISTORY: She  reports that she has quit smoking. she has never used smokeless tobacco. She reports that she does not drink alcohol or use drugs.  REVIEW OF SYSTEMS:   Pulmonary: pt denies increased work of breathing, shortness of breath,  Cardiac: pt denies palpitations, chest pain,  Abdominal: pt denies abdominal pain, nausea, vomiting, or diarrhea  SUBJECTIVE:  Output about 1.3L yesterday, chest x-ray looks a little worse, had afib with RVR yesterday started on dilt drip  VITAL SIGNS: BP (!) 89/72   Pulse (!) 29   Temp (!) 97 F (36.1 C)   Resp 13   Ht 5' (1.524 m)   Wt 115 lb 4.8 oz (52.3 kg)   SpO2 100%   BMI 22.52 kg/m   HEMODYNAMICS:    VENTILATOR SETTINGS: Vent Mode: CPAP;PSV FiO2 (%):  [40 %] 40 % Set Rate:  [14 bmp] 14 bmp Vt Set:  [450 mL] 450 mL PEEP:  [5 cmH20] 5 cmH20 Pressure Support:  [5 cmH20] 5 cmH20 Plateau Pressure:  [20 cmH20] 20 cmH20  INTAKE  / OUTPUT: I/O last 3 completed shifts: In: 1081.9 [I.V.:371.9; NG/GT:710] Out: 1635 [Urine:1635]  PHYSICAL EXAMINATION: Physical Exam  Constitutional: She is oriented to person, place, and time. No distress.  Neck: JVD (10cmH2O) present.  Cardiovascular: Normal rate and regular rhythm. Exam reveals no gallop and no friction rub.  Murmur (2/6 systolic) heard. Pulmonary/Chest: Effort normal. No respiratory distress. She has wheezes (mild diffuse expiratory). She has no rales. She exhibits no tenderness.  Abdominal: Soft. Bowel sounds are normal. She exhibits no distension and no mass. There is no tenderness. There is no rebound and no guarding.  Neurological: She is alert and oriented to person, place, and time.  Skin: She is not diaphoretic.    LABS:  BMET Recent Labs  Lab 04/01/17 0450 04/02/17 0008 04/02/17 0413  NA 140 142 143  K 4.0 3.9 3.9  CL 110 108 109  CO2 20* 21* 20*  BUN 46* 52* 56*  CREATININE 2.25* 2.32* 2.32*  GLUCOSE 138* 154* 87    Electrolytes Recent Labs  Lab 04/01/17 0450 04/02/17 0008 04/02/17 0413  CALCIUM 8.1*  8.7* 8.8*  MG 2.2 2.4 2.4  PHOS 4.0  --  5.5*    CBC Recent Labs  Lab 03/31/17 0202 04/01/17 0450 04/02/17 0413  WBC 7.6 7.2 6.1  HGB 10.0* 8.6* 8.8*  HCT 32.3* 27.9* 29.1*  PLT 259 180 170    Coag's No results for input(s): APTT, INR in the last 168 hours.  Sepsis Markers No results for input(s): LATICACIDVEN, PROCALCITON, O2SATVEN in the last 168 hours.  ABG Recent Labs  Lab 03/31/17 0547 04/01/17 0505  PHART 7.384 7.463*  PCO2ART 36.6 33.7  PO2ART 142.0* 119*    Liver Enzymes Recent Labs  Lab 04/01/17 0450  AST 15  ALT 8*  ALKPHOS 46  BILITOT 0.6  ALBUMIN 2.7*    Cardiac Enzymes Recent Labs  Lab 03/31/17 0741 03/31/17 1525 03/31/17 2135  TROPONINI 0.06* 0.06* 0.06*    Glucose Recent Labs  Lab 04/01/17 0809 04/01/17 1209 04/01/17 1635 04/01/17 2026 04/01/17 2345 04/02/17 0412  GLUCAP 96  167* 191* 133* 154* 94    Imaging No results found.   STUDIES:  CXR 1/11 > interstitial edema.   CULTURES: Blood 1/11 >   ANTIBIOTICS: None.   SIGNIFICANT EVENTS: 1/11 > admit.   LINES/TUBES: ETT 1/11 >    DISCUSSION: 84 y.o. female admitted 1/11 with acute pulmonary edema, CHF exacerbation, hypertensive urgency. Required intubation after failing BiPAP.     ASSESSMENT / PLAN:   PULMONARY A: Acute hypoxic respiratory failure. Acute pulmonary edema - failed BiPAP and later intubated.   Self extubated now on [place] saturating well on 2L [place] P:   Currently on lasix 40mg  TID, will increase since poor output and worse chest x-ray Supplemental O2 as needed   CARDIOVASCULAR A:  Hypertensive urgency - resolved after nitro paste and intubation. CHF with acute exacerbation. Hx NSTEMI, ICM (Echo from 02/05/17 with EF 45 - 50%, G2DD), HTN, HLD, DVT, CAD. Afib w/RVR on 04/01/17 placed on dilt drip P:  BP swings last 24 hours dilt drip at 5 can transition to oral today Continue BIDIL as bp tolerates    RENAL A:   AoCKD. P:   Creatinine stable but still increased from admission Continue to monitor   GASTROINTESTINAL A:   UGIB - blood tinged sputum from OGT. GI prophylaxis. Nutrition. P:   If continues, might need GI consult.  Hold off for now. SUP: Pantoprazole. NPO.   HEMATOLOGIC A:   Anemia - chronic.  Does have blood tinged sputum from OGT on admit. VTE Prophylaxis. P:  Transfuse for Hgb < 7. SCD's only. CBC in AM.   INFECTIOUS A:   No indication of infection. P:   Monitor clinically.   ENDOCRINE A:   DM II.   P:   SSI. Hold preadmission [place].   NEUROLOGIC A:   Pleasant, following commands P:   NTD  Thornell Mule MD PGY-1 Internal Medicine Pager # [phone]  Pulmonary and Critical Care Medicine [facility] Pager: [phone]  04/02/2017, 7:12 AM  Attending Note:  84 year old female with PMH of CHF who developed  pulmonary edema and respiratory failure.  Patient was intubated but self extubated on 1/12.  On exam, bibasilar crackles.  I reviewed CXR myself, pulmonary edema noted.  Discussed with TRH-MD.  Acute respiratory failure:  - Monitor for airway protection  Hypoxemia:  - Titrate O2 for sat of 88-92%  Pulmonary edema:  - Increase lasix 80 mg IV BID  CHF:  - Bidil  A-fib with RVR  -  Transition from IV cardizem to PO  - Tele monitoring  [facility] with [facility]M off 1/14.  Patient seen and examined, agree with above note.  I dictated the care and orders written for this patient under my direction.  Alyson ReedyWesam G. Yacoub, M.D. [facility] Pulmonary/Critical Care Medicine. Pager: [phone]. After hours pager: [phone].

## 2017-04-02 NOTE — Progress Notes (Signed)
Pt transferred to 5W11. Receiving nurse at bedside. Pt placed in chair at bedside with chair alarm. Prior to transfer notified Daughter Arna Mediciora of pts transfer. 1 belonging bag with 1 black pair of slip on shoes, 1 yellow house coat in bag transported with patient. Pt confused at time of transfer, demanding no visitors be allowed except daughters. Pt reoriented for safety.

## 2017-04-03 LAB — GLUCOSE, CAPILLARY
GLUCOSE-CAPILLARY: 42 mg/dL — AB (ref 65–99)
GLUCOSE-CAPILLARY: 123 mg/dL — AB (ref 65–99)
GLUCOSE-CAPILLARY: 219 mg/dL — AB (ref 65–99)
GLUCOSE-CAPILLARY: 194 mg/dL — AB (ref 65–99)
GLUCOSE-CAPILLARY: 172 mg/dL — AB (ref 65–99)
Glucose-Capillary: 143 mg/dL — ABNORMAL HIGH (ref 65–99)

## 2017-04-03 LAB — CBC
HCT: 31 % — ABNORMAL LOW (ref 36.0–46.0)
HEMOGLOBIN: 9.6 g/dL — AB (ref 12.0–15.0)
MCH: 29.5 pg (ref 26.0–34.0)
MCHC: 31 g/dL (ref 30.0–36.0)
MCV: 95.4 fL (ref 78.0–100.0)
PLATELETS: 220 10*3/uL (ref 150–400)
RBC: 3.25 MIL/uL — AB (ref 3.87–5.11)
RDW: 15.6 % — ABNORMAL HIGH (ref 11.5–15.5)
WBC: 6.5 10*3/uL (ref 4.0–10.5)

## 2017-04-03 LAB — BASIC METABOLIC PANEL
Anion gap: 15 (ref 5–15)
BUN: 65 mg/dL — AB (ref 6–20)
CHLORIDE: 105 mmol/L (ref 101–111)
CO2: 19 mmol/L — ABNORMAL LOW (ref 22–32)
CREATININE: 2.84 mg/dL — AB (ref 0.44–1.00)
Calcium: 9 mg/dL (ref 8.9–10.3)
GFR, EST AFRICAN AMERICAN: 16 mL/min — AB (ref >60)
GFR, EST NON AFRICAN AMERICAN: 14 mL/min — AB (ref >60)
Glucose, Bld: 209 mg/dL — ABNORMAL HIGH (ref 65–99)
POTASSIUM: 3.9 mmol/L (ref 3.5–5.1)
SODIUM: 139 mmol/L (ref 135–145)

## 2017-04-03 MED ORDER — POLYETHYLENE GLYCOL 3350 17 GM/SCOOP PO POWD: 17.0000 g | Freq: Every day | ORAL | Status: DC | PRN

## 2017-04-03 MED ORDER — CLONIDINE HCL 0.1 MG PO TABS
0.1000 mg | ORAL_TABLET | Freq: Two times a day (BID) | ORAL | Status: DC
  Administered 2017-04-03 – 2017-04-07 (×9): 0.1 mg via ORAL
  Filled 2017-04-03 (×9): qty 1

## 2017-04-03 MED ORDER — HYDROCORTISONE 2.5 % RE CREA
TOPICAL_CREAM | Freq: Three times a day (TID) | RECTAL | Status: DC
  Administered 2017-04-03 – 2017-04-05 (×6): via RECTAL
  Filled 2017-04-03: qty 28.35

## 2017-04-03 MED ORDER — POLYETHYLENE GLYCOL 3350 17 G PO PACK
17.0000 g | PACK | Freq: Every day | ORAL | Status: DC
  Administered 2017-04-03 – 2017-04-05 (×3): 17 g via ORAL
  Filled 2017-04-03 (×4): qty 1

## 2017-04-03 MED ORDER — FUROSEMIDE 10 MG/ML IJ SOLN
80.0000 mg | Freq: Two times a day (BID) | INTRAMUSCULAR | Status: DC
  Administered 2017-04-03 (×2): 80 mg via INTRAVENOUS
  Filled 2017-04-03 (×2): qty 8

## 2017-04-03 MED ORDER — EZETIMIBE-SIMVASTATIN 10-20 MG PO TABS
1.0000 | ORAL_TABLET | Freq: Every day | ORAL | Status: DC
  Administered 2017-04-03 – 2017-04-06 (×4): 1 via ORAL
  Filled 2017-04-03 (×5): qty 1

## 2017-04-03 MED ORDER — INSULIN ASPART 100 UNIT/ML ~~LOC~~ SOLN
0.0000 [IU] | Freq: Three times a day (TID) | SUBCUTANEOUS | Status: DC
  Administered 2017-04-03: 3 [IU] via SUBCUTANEOUS
  Administered 2017-04-04: 2 [IU] via SUBCUTANEOUS
  Administered 2017-04-04: 1 [IU] via SUBCUTANEOUS
  Administered 2017-04-05 (×2): 2 [IU] via SUBCUTANEOUS
  Administered 2017-04-05: 1 [IU] via SUBCUTANEOUS
  Administered 2017-04-06: 2 [IU] via SUBCUTANEOUS
  Administered 2017-04-06: 1 [IU] via SUBCUTANEOUS
  Administered 2017-04-06: 5 [IU] via SUBCUTANEOUS
  Administered 2017-04-07 (×2): 2 [IU] via SUBCUTANEOUS

## 2017-04-03 MED ORDER — ASPIRIN EC 81 MG PO TBEC
81.0000 mg | DELAYED_RELEASE_TABLET | Freq: Every day | ORAL | Status: DC
  Administered 2017-04-03 – 2017-04-07 (×5): 81 mg via ORAL
  Filled 2017-04-03 (×5): qty 1

## 2017-04-03 MED ORDER — NEBIVOLOL HCL 10 MG PO TABS
20.0000 mg | ORAL_TABLET | Freq: Every day | ORAL | Status: DC
  Administered 2017-04-03 – 2017-04-07 (×5): 20 mg via ORAL
  Filled 2017-04-03 (×5): qty 2

## 2017-04-03 MED ORDER — TIMOLOL HEMIHYDRATE 0.5 % OP SOLN: 1.0000 [drp] | Freq: Every day | OPHTHALMIC | Status: DC

## 2017-04-03 MED ORDER — HYDRALAZINE HCL 20 MG/ML IJ SOLN
5.0000 mg | INTRAMUSCULAR | Status: DC | PRN
Start: 2017-04-03 — End: 2017-04-07
  Administered 2017-04-05: 5 mg via INTRAVENOUS
  Filled 2017-04-03: qty 1

## 2017-04-03 MED ORDER — PANTOPRAZOLE SODIUM 40 MG PO TBEC
40.0000 mg | DELAYED_RELEASE_TABLET | Freq: Every day | ORAL | Status: DC
  Administered 2017-04-03 – 2017-04-07 (×5): 40 mg via ORAL
  Filled 2017-04-03 (×5): qty 1

## 2017-04-03 MED ORDER — ENSURE ENLIVE PO LIQD
237.0000 mL | Freq: Two times a day (BID) | ORAL | Status: DC
  Administered 2017-04-03 – 2017-04-07 (×8): 237 mL via ORAL

## 2017-04-03 NOTE — Progress Notes (Signed)
Nutrition Follow-up  DOCUMENTATION CODES:   Severe malnutrition in context of chronic illness  INTERVENTION:  Ensure Enlive po BID, each supplement provides 350 kcal and 20 grams of protein  Monitor GOC  NUTRITION DIAGNOSIS:   Severe Malnutrition related to chronic illness(rectal prolapse, CHF, CKD, dementia) as evidenced by energy intake < or equal to 75% for > or equal to 1 month, percent weight loss(15% weight loss in 1 month). -ongoing  GOAL:   Patient will meet greater than or equal to 90% of their needs -progressing  MONITOR:   Skin, I & O's, Labs, Weight trends, PO intake, Supplement acceptance  REASON FOR ASSESSMENT:   Ventilator, Consult(verbal) Enteral/tube feeding initiation and management  ASSESSMENT:   [age] yo female with PMH of DM, HTN, CHF, CKD, dementia, rectal prolapse who was admitted on 1/11 with hypoxic respiratory failure requiring intubation.  Spoke with [name] at bedside. Seen for change of status, self extubated 1/12. She is eating well, had eggs and pancakes, along with other things she could not recall for breakfast. Repeatedly asking fo "[name]" and her daughter.   Labs reviewed:  CBGs 219, 143, 123 Phos 5.5 Medications reviewed and include:  Insulin, Miralax   Intake/Output Summary (Last 24 hours) at 04/03/2017 1606 Last data filed at 04/03/2017 0941 Gross per 24 hour  Intake 360 ml  Output 375 ml  Net -15 ml   Diet Order:  Diet Heart Room service appropriate? Yes; Fluid consistency: Thin; Fluid restriction: 2000 mL Fluid  EDUCATION NEEDS:   No education needs have been identified at this time  Skin:  Skin Assessment: Skin Integrity Issues: Skin Integrity Issues:: Stage II Stage II: buttocks & sacrum  Last BM:  1/11  Height:   Ht Readings from Last 1 Encounters:  03/31/17 5' (1.524 m)    Weight:   Wt Readings from Last 1 Encounters:  04/02/17 115 lb 4.8 oz (52.3 kg)    Ideal Body Weight:  45.5 kg  BMI:  Body mass  index is 22.52 kg/m.  Estimated Nutritional Needs:   Kcal:  1300-1570 calories  Protein:  70-80 gm  Fluid:  1.5 L   Ashley AnoWilliam M. Safaa Stingley, MS, RD LDN Inpatient Clinical Dietitian Pager [phone]

## 2017-04-03 NOTE — Progress Notes (Addendum)
PROGRESS NOTE    JOURNEY CASTONGUAY  ZOX:[id number] DOB: 26-Aug-1927 DOA: 03/31/2017 PCP: Kirt Boys, DO     Brief Narrative:  RHEA KAELIN is a 84 yo female with past medical history significant for essential hypertension, chronic kidney disease stage III, chronic combined systolic and diastolic heart failure, dementia, type 2 diabetes, rectal prolapse. She presented to [facility] ED 03/31/17 with SOB and chest pain. In ED, she had SpO2 in 80s on room air and she was found to have signs of volume overload. She also had hypertensive urgency with BP of 180 / 100. CXR demonstrated interstitial edema and BNP was 1,500. She was started on BiPAP for increased WOB; however, she failed and ultimately required intubation.  Patient self extubated on 1/12 and patient was transferred to [facility] service on 1/14.  Upon chart review, this is her fourth admission for same diagnosis of acute combined systolic/diastolic heart failure and respiratory failure in the past 2 months. Patient resides with daughter. Daughter initially states that patient is very compliant with medications, then later adds that patient will sometimes skip her lasix dose as she does not want to urinate so much.  Also when questioned regarding low-sodium diet, daughter states that patient is very compliant with low-sodium diet.  With further probing, daughter admits that patient's typical diet consists of soup, hamburger and mashed potatoes.  Assessment & Plan:   Active Problems:   Pressure injury of skin   Respiratory failure (HCC)   Protein-calorie malnutrition, severe  Acute hypoxemic respiratory failure secondary to acute exacerbation of systolic and diastolic heart failure -Patient continues to be noncompliant with medications and diet at home and now returns for her fourth hospitalization in the past 2 months -Intubated on admission 1/11, self extubated on 1/12  -BNP 1499.8 on admission -Echo EF 40%  -Dietitian consult for diet education for  family  -Continue IV Lasix, strict I's and O's, daily weight  AKI on chronic kidney disease stage III -Baseline creatinine 2.1 -Worsening in setting of lasix, continue to monitor BMP   Essential hypertension -Continue bidil, catapres, nebivolol   Paroxysmal A Fib -New onset this admission, required cardizem gtt. Now off. Continue telemetry   DM well controlled  -Ha1c 6.5 -SSI  Hyperlipidemia -Continue vytorin   Dementia -Continue supportive care  Rectal prolapse -Was deemed nonsurgical candidate during previous hospitalization  Goals of care -Patient with dementia, resides at home with daughter. Now returns for 4th hospitalization for same dx and medical noncompliance. Consulted palliative care medicine for goals of care discussion   DVT prophylaxis: subq hep  Code Status: Full Family Communication: Daughter at bedside Disposition Plan: Pending improvement   Consultants:   PCCM admission   Antimicrobials:  Anti-infectives (From admission, onward)   None        Subjective: Patient with dementia, sitting in chair at the nurses station. Tells me that someone was trying to meds in her mouth to kill her upstairs. She states she is ready to go home. Denies chest pain or shortness of breath.   Objective: Vitals:   04/02/17 1600 04/02/17 1806 04/02/17 2243 04/03/17 0654  BP: 135/76 (!) 172/55 (!) 183/46 (!) 147/44  Pulse: (!) 54 99 65 (!) 56  Resp: 15  17 18   Temp: 98.6 F (37 C) 98.6 F (37 C) 97.8 F (36.6 C) (!) 97.5 F (36.4 C)  TempSrc: Core (Comment) Oral Oral Oral  SpO2: 98%  96% 98%  Weight:      Height:  Intake/Output Summary (Last 24 hours) at 04/03/2017 1344 Last data filed at 04/03/2017 0941 Gross per 24 hour  Intake 380 ml  Output 420 ml  Net -40 ml   Filed Weights   03/31/17 0204 04/01/17 0436 04/02/17 0316  Weight: 47.2 kg (104 lb) 53.6 kg (118 lb 2.7 oz) 52.3 kg (115 lb 4.8 oz)    Examination:  General exam: Appears calm and  comfortable  Respiratory system: Respiratory effort normal. On [place] O2, crackles bilaterally  Cardiovascular system: S1 & S2 heard. No JVD, murmurs, rubs, gallops or clicks. No pedal edema. Gastrointestinal system: Abdomen is nondistended, soft and nontender. No organomegaly or masses felt. Normal bowel sounds heard. Central nervous system: Alert  Extremities: Symmetric  Skin: No rashes, lesions or ulcers Psychiatry: +Dementia   Data Reviewed: I have personally reviewed following labs and imaging studies  CBC: Recent Labs  Lab 03/31/17 0202 04/01/17 0450 04/02/17 0413 04/03/17 1001  WBC 7.6 7.2 6.1 6.5  HGB 10.0* 8.6* 8.8* 9.6*  HCT 32.3* 27.9* 29.1* 31.0*  MCV 95.0 94.3 96.4 95.4  PLT 259 180 170 220   Basic Metabolic Panel: Recent Labs  Lab 03/31/17 0741 04/01/17 0450 04/02/17 0008 04/02/17 0413 04/03/17 1001  NA 140 140 142 143 139  K 4.8 4.0 3.9 3.9 3.9  CL 109 110 108 109 105  CO2 20* 20* 21* 20* 19*  GLUCOSE 180* 138* 154* 87 209*  BUN 38* 46* 52* 56* 65*  CREATININE 2.03* 2.25* 2.32* 2.32* 2.84*  CALCIUM 8.4* 8.1* 8.7* 8.8* 9.0  MG  --  2.2 2.4 2.4  --   PHOS  --  4.0  --  5.5*  --    GFR: Estimated Creatinine Clearance: 10 mL/min (A) (by C-G formula based on SCr of 2.84 mg/dL (H)). Liver Function Tests: Recent Labs  Lab 04/01/17 0450  AST 15  ALT 8*  ALKPHOS 46  BILITOT 0.6  PROT 5.2*  ALBUMIN 2.7*   No results for input(s): LIPASE, AMYLASE in the last 168 hours. No results for input(s): AMMONIA in the last 168 hours. Coagulation Profile: No results for input(s): INR, PROTIME in the last 168 hours. Cardiac Enzymes: Recent Labs  Lab 03/31/17 0741 03/31/17 1525 03/31/17 2135  TROPONINI 0.06* 0.06* 0.06*   BNP (last 3 results) No results for input(s): PROBNP in the last 8760 hours. HbA1C: No results for input(s): HGBA1C in the last 72 hours. CBG: Recent Labs  Lab 04/02/17 2229 04/02/17 2343 04/03/17 0358 04/03/17 0809 04/03/17 1152    GLUCAP 235* 185* 123* 143* 219*   Lipid Profile: No results for input(s): CHOL, HDL, LDLCALC, TRIG, CHOLHDL, LDLDIRECT in the last 72 hours. Thyroid Function Tests: No results for input(s): TSH, T4TOTAL, FREET4, T3FREE, THYROIDAB in the last 72 hours. Anemia Panel: No results for input(s): VITAMINB12, FOLATE, FERRITIN, TIBC, IRON, RETICCTPCT in the last 72 hours. Sepsis Labs: No results for input(s): PROCALCITON, LATICACIDVEN in the last 168 hours.  Recent Results (from the past 240 hour(s))  Urine culture     Status: None   Collection Time: 03/31/17  5:20 AM  Result Value Ref Range Status   Specimen Description URINE, RANDOM  Final   Special Requests NONE  Final   Culture NO GROWTH  Final   Report Status 04/01/2017 FINAL  Final  Culture, blood (routine x 2)     Status: None (Preliminary result)   Collection Time: 03/31/17  7:37 AM  Result Value Ref Range Status   Specimen Description BLOOD RIGHT  HAND  Final   Special Requests IN PEDIATRIC BOTTLE Blood Culture adequate volume  Final   Culture NO GROWTH 2 DAYS  Final   Report Status PENDING  Incomplete  Culture, blood (routine x 2)     Status: None (Preliminary result)   Collection Time: 03/31/17  7:47 AM  Result Value Ref Range Status   Specimen Description BLOOD LEFT HAND  Final   Special Requests IN PEDIATRIC BOTTLE Blood Culture adequate volume  Final   Culture NO GROWTH 2 DAYS  Final   Report Status PENDING  Incomplete  MRSA PCR Screening     Status: None   Collection Time: 03/31/17  8:18 AM  Result Value Ref Range Status   MRSA by PCR NEGATIVE NEGATIVE Final    Comment:        The GeneXpert MRSA Assay (FDA approved for NASAL specimens only), is one component of a comprehensive MRSA colonization surveillance program. It is not intended to diagnose MRSA infection nor to guide or monitor treatment for MRSA infections.        Radiology Studies: Dg Chest Port 1 View  Result Date: 04/02/2017 CLINICAL DATA:   Acute hypoxic respiratory failure and pulmonary edema. EXAM: PORTABLE CHEST 1 VIEW COMPARISON:  One-view chest x-ray 04/01/2017. FINDINGS: Heart is enlarged. The patient has been extubated. Dense atherosclerotic calcifications are present. Mild edema has increased slightly. Bilateral pleural effusions are present. Bibasilar airspace disease likely reflects atelectasis. IMPRESSION: 1. Interval extubation. 2. Cardiomegaly with increased interstitial edema and bilateral effusions compatible with congestive heart failure. 3. Bibasilar airspace disease likely reflects atelectasis. Infection is not excluded. 4.  Aortic Atherosclerosis (ICD10-I70.0). Electronically Signed   By: Marin Robertshristopher  Mattern M.D.   On: 04/02/2017 07:30      Scheduled Meds: . diltiazem  60 mg Oral Q12H  . docusate sodium  100 mg Oral Q12H  . furosemide  80 mg Intravenous BID  . heparin injection (subcutaneous)  5,000 Units Subcutaneous Q8H  . insulin aspart  0-9 Units Subcutaneous Q4H  . isosorbide-hydrALAZINE  1 tablet Oral BID  . pantoprazole (PROTONIX) IV  40 mg Intravenous QHS  . potassium chloride  40 mEq Oral Once  . timolol  1 drop Left Eye Daily   Continuous Infusions: . sodium chloride Stopped (04/02/17 1600)     LOS: 3 days    Time spent: 40 minutes   Noralee StainJennifer Christopher Hink, DO [facility] [email or web address] Password [email or web address] 04/03/2017, 1:45 PM

## 2017-04-04 DIAGNOSIS — I509 Heart failure, unspecified: Secondary | ICD-10-CM

## 2017-04-04 DIAGNOSIS — E43 Unspecified severe protein-calorie malnutrition: Secondary | ICD-10-CM

## 2017-04-04 DIAGNOSIS — Z515 Encounter for palliative care: Secondary | ICD-10-CM

## 2017-04-04 DIAGNOSIS — Z7189 Other specified counseling: Secondary | ICD-10-CM

## 2017-04-04 LAB — CBC
HCT: 24.5 % — ABNORMAL LOW (ref 36.0–46.0)
HEMOGLOBIN: 7.8 g/dL — AB (ref 12.0–15.0)
MCH: 29.7 pg (ref 26.0–34.0)
MCHC: 31.8 g/dL (ref 30.0–36.0)
MCV: 93.2 fL (ref 78.0–100.0)
PLATELETS: 175 10*3/uL (ref 150–400)
RBC: 2.63 MIL/uL — AB (ref 3.87–5.11)
RDW: 15.4 % (ref 11.5–15.5)
WBC: 4.4 10*3/uL (ref 4.0–10.5)

## 2017-04-04 LAB — GLUCOSE, CAPILLARY
GLUCOSE-CAPILLARY: 175 mg/dL — AB (ref 65–99)
GLUCOSE-CAPILLARY: 143 mg/dL — AB (ref 65–99)
GLUCOSE-CAPILLARY: 106 mg/dL — AB (ref 65–99)
GLUCOSE-CAPILLARY: 159 mg/dL — AB (ref 65–99)

## 2017-04-04 LAB — BASIC METABOLIC PANEL
ANION GAP: 13 (ref 5–15)
BUN: 73 mg/dL — ABNORMAL HIGH (ref 6–20)
CALCIUM: 8.3 mg/dL — AB (ref 8.9–10.3)
CO2: 21 mmol/L — ABNORMAL LOW (ref 22–32)
CREATININE: 3 mg/dL — AB (ref 0.44–1.00)
Chloride: 105 mmol/L (ref 101–111)
GFR, EST AFRICAN AMERICAN: 15 mL/min — AB (ref >60)
GFR, EST NON AFRICAN AMERICAN: 13 mL/min — AB (ref >60)
Glucose, Bld: 165 mg/dL — ABNORMAL HIGH (ref 65–99)
Potassium: 4.2 mmol/L (ref 3.5–5.1)
SODIUM: 139 mmol/L (ref 135–145)

## 2017-04-04 MED ORDER — OLANZAPINE 5 MG PO TBDP
5.0000 mg | ORAL_TABLET | Freq: Every day | ORAL | Status: DC
  Administered 2017-04-04 – 2017-04-06 (×3): 5 mg via ORAL
  Filled 2017-04-04 (×3): qty 1

## 2017-04-04 NOTE — Progress Notes (Signed)
Nutrition Education Note  RD consulted for nutrition education regarding new onset CHF.  Discussed with daughter and patient, patient is adamant in saying she does not use any salt. Daughter tries to give patient low sodium diet, but patient eats very little so daughter has been compromising on foods. Discussed and answered questions.  RD provided "Low Sodium Nutrition Therapy" handout from the Academy of [employer] and [employer]etics. Reviewed patient's dietary recall. Provided examples on ways to decrease sodium intake in diet. Discouraged intake of processed foods and use of salt shaker. Encouraged fresh fruits and vegetables as well as whole grain sources of carbohydrates to maximize fiber intake.   RD discussed why it is important for patient to adhere to diet recommendations, and emphasized the role of fluids, foods to avoid, and importance of weighing self daily. Teach back method used.  Expect fair compliance.  Body mass index is 24.11 kg/m. Pt meets criteria for normal weight based on current BMI.  Current diet order is heart healthy, patient is consuming approximately 100% of meals at this time. Labs and medications reviewed. No further nutrition interventions warranted at this time. RD contact information provided. If additional nutrition issues arise, please re-consult RD.   Dionne AnoWilliam M. Jeneane Pieczynski, MS, RD LDN Inpatient Clinical Dietitian Pager [phone]

## 2017-04-04 NOTE — Consult Note (Signed)
Consultation Note Date: 04/04/2017   Patient Name: [name]  DOB: 06-19-1927  MRN: [id number]  Age / Sex: 84 y.o., female  PCP: Ashley Cranker, DO Referring Physician: Reyne Dumas, MD  Reason for Consultation: Establishing goals of care  HPI/Patient Profile: 84 y.o. female  with past medical history of Diastolic and systolic CHF (EF 71%), CKD, CAD, DM, HTN, hx CVA, dementia w/ behavior changes admitted on 03/31/2017 with respiratory failure r/t CHF exacerbation. She presented with SOB and chest pain, desated on NRB and bipap, and was eventually intubated. This is her fourth admission in the last two months for the same condition. Palliative medicine consulted for [place].    Clinical Assessment and Goals of Care:  I have reviewed medical records including EPIC notes, labs and imaging, assessed the patient and then met at the bedside along with patient's HCPOA- daughter, [name]  to discuss diagnosis prognosis, GOC, EOL wishes, disposition and options.  I introduced Palliative Medicine as specialized medical care for people living with serious illness. It focuses on providing relief from the symptoms and stress of a serious illness. The goal is to improve quality of life for both the patient and the family.  We discussed a brief life review of the patient. 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 Her independence was something she valued.    As far as functional and nutritional status- she lives with her daughter [name] and friend [name] (who has TBI) in the home. She bathes and dresses herself with supervision. She is on chronic O2 while ambulating and at rest. Per nutrition note she has had greater than 15% weight loss over the last few months. [name] tells me that she just doesn't eat much.  We discussed their current illness and what it means in the larger context of their on-going co-morbidities.  Natural  disease trajectory and expectations at EOL were discussed. The patient tells me that she does not have any heart problems. When I try to update her on her medical status, she tells me she doesn't feel that anything is wrong and she doesn't understand why she has been in the hospital. I discussed this separately with [name]- discussed overall trajectory of CHF and that increasing hospitalizations, decreased nutritional status, indicate patient may be on downward trajectory.   I attempted to elicit values and goals of care important to the patient. [name] states that main [place] would be to keep patient at home with her cats. Patient doesn't enjoy being in the hospital.   [name] notes patient has been extremely delusional and paranoid. This is baseline for patient. At home she is obsessed with Daisy Floro and believes that he calls her every day. She also frequently accuses her daughters of trying to kill her. She is often agitated and refuses to take her medication.  The difference between aggressive medical intervention and comfort care was considered in light of the patient's goals of care.   Advanced directives, concepts specific to code status, artifical feeding and hydration, and rehospitalization  were considered and discussed. Patient has expressed desire for DNR/DNI in the past. [name] reversed this in the ED when patient was having difficulty breathing and she was told by primary team that intubation was only option. [name] states patient has said this admission she would want resuscitation, but does not believe patient is clear minded- and I agree. [name] is considering changing patient's code status back to DNR.  Hospice and Palliative Care services outpatient were explained and offered.  Questions and concerns were addressed.  Hard Choices booklet left for review. The family was encouraged to call with questions or concerns.    Primary Decision Maker HCPOA - patient's daughter- [name]    SUMMARY OF  RECOMMENDATIONS  - Continue current level of care -PMT will follow up with [name] tomorrow at 2pm for continued GOC- [name] considering change code status and referral to Hospice -Will start Zyprexa 35m disentegrating tablet at bedtime for agitation and paranoia related to dementia  Code Status/Advance Care Planning:  Full code  Palliative Prophylaxis:   Delirium Protocol  Additional Recommendations (Limitations, Scope, Preferences):  Avoid Hospitalization  Prognosis:    < 6 months r/t CHF, frequent hospitalizations, weight loss >15% over the last 4 months  Discharge Planning: To Be Determined  Primary Diagnoses: Present on Admission: . Respiratory failure ([facility] [place] . Pressure injury of skin   I have reviewed the medical record, interviewed the patient and family, and examined the patient. The following aspects are pertinent.  Past Medical History:  Diagnosis Date  . Anemia   . Anxiety disorder   . Carotid artery occlusion   . Carotid bruit   . Chronic kidney disease (CKD), stage III (moderate) (HCC)   . Coronary artery disease    Remote PCI. Had BMS to RCA and DES stent to Diagonal in Jan. 2012   . Diastolic heart failure    EF 55 to 60% per cath in Jan 2012  . D[place] (deep venous thrombosis) ([facility]   . Gout   . Hyperlipidemia   . Hypertension   . Ischemic cardiomyopathy    with prior EF of 35%  . NSTEMI (non-ST elevated myocardial infarction) ([facility] Jan 2012   with PCI to RCA and DX per Dr. CBurt Knack . Obesity   . Pneumonia 2016  . Proteinuria   . Stroke ([facility] 2013   ?  mini    . TIA (transient ischemic attack)   . Type II diabetes mellitus ([facility]    long standing   Social History   Socioeconomic History  . Marital status: Widowed    Spouse name: None  . Number of children: None  . Years of education: None  . Highest education level: None  Social Needs  . Financial resource strain: None  . Food insecurity - worry: None  . Food insecurity - inability: None  .  Transportation needs - medical: None  . Transportation needs - non-medical: None  Occupational History  . Occupation: retired OR nMarine scientist Tobacco Use  . Smoking status: Former SResearch scientist (life sciences) . Smokeless tobacco: Never Used  . Tobacco comment: "smoked in my teens; quit after 1 year or so"  Substance and Sexual Activity  . Alcohol use: No    Alcohol/week: 0.0 oz  . Drug use: No  . Sexual activity: No  Other Topics Concern  . None  Social History Narrative   Widow   Former smoker   Alcohol none   No Advance Directive   Family History  Problem Relation Age of  Onset  . Diabetes Daughter   . CAD Neg Hx    Scheduled Meds: . aspirin EC  81 mg Oral Daily  . cloNIDine  0.1 mg Oral BID  . docusate sodium  100 mg Oral Q12H  . ezetimibe-simvastatin  1 tablet Oral QHS  . feeding supplement (ENSURE ENLIVE)  237 mL Oral BID BM  . heparin injection (subcutaneous)  5,000 Units Subcutaneous Q8H  . hydrocortisone   Rectal TID  . insulin aspart  0-9 Units Subcutaneous TID WC  . isosorbide-hydrALAZINE  1 tablet Oral BID  . nebivolol  20 mg Oral Daily  . OLANZapine zydis  5 mg Oral QHS  . pantoprazole  40 mg Oral Daily  . polyethylene glycol  17 g Oral Daily  . timolol  1 drop Left Eye Daily   Continuous Infusions: . sodium chloride Stopped (04/02/17 1600)   PRN Meds:.sodium chloride, hydrALAZINE Medications Prior to Admission:  Prior to Admission medications   Medication Sig Start Date End Date Taking? Authorizing Provider  aspirin EC 81 MG tablet Take 1 tablet (81 mg total) by mouth daily. 12/11/16  Yes Florencia Reasons, MD  cloNIDine (CATAPRES) 0.1 MG tablet Take 1 tablet (0.1 mg total) by mouth 2 (two) times daily. 03/22/17  Yes Charlynne Cousins, MD  docusate sodium (COLACE) 100 MG capsule Take 1 capsule (100 mg total) by mouth every 12 (twelve) hours. 01/09/17  Yes Ward, Delice Bison, DO  ezetimibe-simvastatin (VYTORIN) 10-20 MG tablet TAKE 1/2 TABLET BY MOUTH ONCE NIGHTLY TO CONTROL CHOLESTEROL  01/19/17  Yes Eulas Post, Chinchilla, DO  feeding supplement, ENSURE ENLIVE, (ENSURE ENLIVE) LIQD Take 237 mLs by mouth 2 (two) times daily between meals. 12/11/16  Yes Florencia Reasons, MD  furosemide (LASIX) 20 MG tablet TAKE ONE TABLET BY MOUTH TWICE DAILY FOR HYPERTENSION 03/24/17  Yes Eulas Post, Monica, DO  hydrocortisone (PROCTOZONE-HC) 2.5 % rectal cream APPLY TO RECTUM THREE TIMES A DAY FOR DISCOMFORT 03/24/17  Yes Eulas Post, Monica, DO  isosorbide-hydrALAZINE (BIDIL) 20-37.5 MG tablet Take 1 tablet by mouth 2 (two) times daily. 03/22/17  Yes Charlynne Cousins, MD  JANUVIA 100 MG tablet Take 1 tablet (100 mg total) by mouth daily. 02/16/17  Yes Filla, Monica, DO  LOTEMAX 0.5 % GEL PLACE 1 DROP IN THE LEFT EYE TWICE DAILY 03/24/17  Yes Ashley Cranker, DO  Nebivolol HCl (BYSTOLIC) 20 MG TABS Take 1 tablet (20 mg total) by mouth daily. For Blood Pressure 03/22/17  Yes Charlynne Cousins, MD  nitroGLYCERIN (NITROSTAT) 0.4 MG SL tablet Place one under tongue for chest pain, up to 3 tablets 03/24/17  Yes Eulas Post, Monica, DO  polyethylene glycol powder (GLYCOLAX/MIRALAX) powder Take 17 g by mouth daily as needed for mild constipation.  01/16/17  Yes [provider]  timolol (BETIMOL) 0.5 % ophthalmic solution INSTILL 1 DROP INTO THE LEFT EYE DAILY 03/24/17  Yes Ashley Cranker, DO   Allergies  Allergen Reactions  . Namzaric [Memantine Hcl-Donepezil Hcl] Nausea Only and Other (See Comments)    confusion  . Percocet [Oxycodone-Acetaminophen] Other (See Comments)    loosy goosy  . Sulfa Antibiotics Other (See Comments)    Tongue swells  . Adhesive [Tape] Other (See Comments)    Tears skin.  Please use "paper" tape  . Metformin And Related Diarrhea and Other (See Comments)    Abnormal Kidney Functions   . Tradjenta [Linagliptin] Other (See Comments)    Low back ache, resolved once medication stopped   . Penicillins Hives and Other (See  Comments)    Tolerates Ceftriaxone, Has patient had a PCN reaction causing  immediate rash, facial/tongue/throat swelling, SOB or lightheadedness with hypotension: Unknown Has patient had a PCN reaction causing severe rash involving mucus membranes or skin necrosis: No Has patient had a PCN reaction that required hospitalization No Has patient had a PCN reaction occurring within the last 10 years: No If all of the above answers are "NO", then may proceed with Cephalosporin use.    Review of Systems  Unable to perform ROS: Dementia    Physical Exam  Constitutional:  frail  Pulmonary/Chest:  wheezing  Neurological: She is alert. She is disoriented.  Skin: Skin is warm. There is pallor.  Psychiatric:  Judgement impaired, memory impaired  Nursing note and vitals reviewed.   Vital Signs: BP (!) 164/42 (BP Location: Right Arm)   Pulse (!) 55   Temp 97.6 F (36.4 C) (Oral)   Resp 16   Ht 5' (1.524 m)   Wt 56 kg (123 lb 7.3 oz)   SpO2 100%   BMI 24.11 kg/m  Pain Assessment: 0-10   Pain Score: 0-No pain   SpO2: SpO2: 100 % O2 Device:SpO2: 100 % O2 Flow Rate: .O2 Flow Rate (L/min): 2 L/min  IO: Intake/output summary:   Intake/Output Summary (Last 24 hours) at 04/04/2017 1406 Last data filed at 04/04/2017 1338 Gross per 24 hour  Intake 1790 ml  Output 300 ml  Net 1490 ml    LBM: Last BM Date: 03/31/17 Baseline Weight: Weight: 47.2 kg (104 lb) Most recent weight: Weight: 56 kg (123 lb 7.3 oz)     Palliative Assessment/Data: PPS: 30%     Thank you for this consult. Palliative medicine will continue to follow and assist as needed.   Time In: 1300 Time Out: 1415 Time Total: 75 minutes Greater than 50%  of this time was spent counseling and coordinating care related to the above assessment and plan.  Signed by: Mariana Kaufman, AGNP-C Palliative Medicine    Please contact Palliative Medicine Team phone at [phone] for questions and concerns.  For individual provider: See Shea Evans

## 2017-04-04 NOTE — Progress Notes (Addendum)
PROGRESS NOTE    [name]  JXB:[id number] DOB: 19-Feb-1928 DOA: 03/31/2017 PCP: Kirt Boysarter, Monica, DO     Brief Narrative:  [name] is a 84 yo female with past medical history significant for essential hypertension, chronic kidney disease stage III, chronic combined systolic and diastolic heart failure, dementia, type 2 diabetes, rectal prolapse. [facility] with SOB and chest pain. In ED, she had SpO2 in 80s on room air and she was found to have signs of volume overload. She also had hypertensive urgency with BP of 180 / 100. CXR demonstrated interstitial edema and BNP was 1,500. She was started on BiPAP for increased WOB; however, she failed and ultimately required intubation.  Patient self extubated on 1/12 and [facility]  Upon chart review, this is her fourth admission for same diagnosis of acute combined systolic/diastolic heart failure and respiratory failure in the past 2 months. Patient resides with daughter. Daughter initially states that patient is very compliant with medications, then later adds that patient will sometimes skip her lasix dose as she does not want to urinate so much.  Also when questioned regarding low-sodium diet, daughter states that patient is very compliant with low-sodium diet.  With further probing, daughter admits that patient's typical diet consists of soup, hamburger and mashed potatoes.  Assessment & Plan:   Active Problems:   Pressure injury of skin   Respiratory failure (HCC)   Protein-calorie malnutrition, severe  Acute hypoxemic respiratory failure secondary to acute exacerbation of systolic and diastolic heart failure -Patient continues to be noncompliant with medications and diet at home and now returns for her fourth hospitalization in the past 2 months -Intubated on admission 1/11, self extubated on 1/12  -BNP 1499.8 on admission -Echo EF 40%  -Dietitian consult for diet education for  family  -DC'd  IV Lasix, as cr now 3.0, weight up from 104 lbs >123lbs  in 4 days despite being on diuretics    AKI on chronic kidney disease stage III -Baseline creatinine 2.1 -Worsening in setting of lasix, continue to monitor BMP   Hold lasix for now If renal function increases , will consult nephrology      Essential hypertension -Continue bidil, catapres, nebivolol   Paroxysmal A Fib -New onset this admission, required cardizem gtt. Now off. Continue telemetry   DM well controlled  -Ha1c 6.5 -SSI  Hyperlipidemia -Continue vytorin   Dementia -Continue supportive care  Rectal prolapse -Was deemed nonsurgical candidate during previous hospitalization  Goals of care -Patient with dementia, resides at home with daughter. Now returns for 4th hospitalization for same dx and medical noncompliance. Consulted palliative care medicine for goals of care discussion   DVT prophylaxis: subq hep  Code Status: Full Family Communication: Daughter at bedside Disposition Plan: Pending improvement   Consultants:   PCCM admission   Antimicrobials:  Anti-infectives (From admission, onward)   None       Subjective: Awake and alert but confused, confabulating stories about how some tried to kill her   Objective: Vitals:   04/03/17 0654 04/03/17 1438 04/03/17 2154 04/04/17 0534  BP: (!) 147/44 (!) 188/46 (!) 153/46 (!) 164/42  Pulse: (!) 56 61 (!) 59 (!) 55  Resp: 18 17 18 16   Temp: (!) 97.5 F (36.4 C) 98 F (36.7 C) 98.4 F (36.9 C) 97.6 F (36.4 C)  TempSrc: Oral Oral Oral Oral  SpO2: 98% 99% 98% 100%  Weight:  56 kg (123 lb 7.3 oz)  Height:        Intake/Output Summary (Last 24 hours) at 04/04/2017 1337 Last data filed at 04/04/2017 0900 Gross per 24 hour  Intake 480 ml  Output 300 ml  Net 180 ml   Filed Weights   04/01/17 0436 04/02/17 0316 04/04/17 0534  Weight: 53.6 kg (118 lb 2.7 oz) 52.3 kg (115 lb 4.8 oz) 56 kg (123 lb 7.3 oz)    Examination:    General exam: Appears calm and comfortable  Respiratory system: Respiratory effort normal. On [place] O2, crackles bilaterally  Cardiovascular system: S1 & S2 heard. No JVD, murmurs, rubs, gallops or clicks. No pedal edema. Gastrointestinal system: Abdomen is nondistended, soft and nontender. No organomegaly or masses felt. Normal bowel sounds heard. Central nervous system: Alert  Extremities: Symmetric  Skin: No rashes, lesions or ulcers Psychiatry: +Dementia   Data Reviewed: I have personally reviewed following labs and imaging studies  CBC: Recent Labs  Lab 03/31/17 0202 04/01/17 0450 04/02/17 0413 04/03/17 1001 04/04/17 0258  WBC 7.6 7.2 6.1 6.5 4.4  HGB 10.0* 8.6* 8.8* 9.6* 7.8*  HCT 32.3* 27.9* 29.1* 31.0* 24.5*  MCV 95.0 94.3 96.4 95.4 93.2  PLT 259 180 170 220 175   Basic Metabolic Panel: Recent Labs  Lab 04/01/17 0450 04/02/17 0008 04/02/17 0413 04/03/17 1001 04/04/17 0258  NA 140 142 143 139 139  K 4.0 3.9 3.9 3.9 4.2  CL 110 108 109 105 105  CO2 20* 21* 20* 19* 21*  GLUCOSE 138* 154* 87 209* 165*  BUN 46* 52* 56* 65* 73*  CREATININE 2.25* 2.32* 2.32* 2.84* 3.00*  CALCIUM 8.1* 8.7* 8.8* 9.0 8.3*  MG 2.2 2.4 2.4  --   --   PHOS 4.0  --  5.5*  --   --    GFR: Estimated Creatinine Clearance: 10.4 mL/min (A) (by C-G formula based on SCr of 3 mg/dL (H)). Liver Function Tests: Recent Labs  Lab 04/01/17 0450  AST 15  ALT 8*  ALKPHOS 46  BILITOT 0.6  PROT 5.2*  ALBUMIN 2.7*   No results for input(s): LIPASE, AMYLASE in the last 168 hours. No results for input(s): AMMONIA in the last 168 hours. Coagulation Profile: No results for input(s): INR, PROTIME in the last 168 hours. Cardiac Enzymes: Recent Labs  Lab 03/31/17 0741 03/31/17 1525 03/31/17 2135  TROPONINI 0.06* 0.06* 0.06*   BNP (last 3 results) No results for input(s): PROBNP in the last 8760 hours. HbA1C: No results for input(s): HGBA1C in the last 72 hours. CBG: Recent Labs  Lab  04/03/17 1152 04/03/17 1630 04/03/17 2153 04/04/17 0953 04/04/17 1327  GLUCAP 219* 194* 172* 175* 143*   Lipid Profile: No results for input(s): CHOL, HDL, LDLCALC, TRIG, CHOLHDL, LDLDIRECT in the last 72 hours. Thyroid Function Tests: No results for input(s): TSH, T4TOTAL, FREET4, T3FREE, THYROIDAB in the last 72 hours. Anemia Panel: No results for input(s): VITAMINB12, FOLATE, FERRITIN, TIBC, IRON, RETICCTPCT in the last 72 hours. Sepsis Labs: No results for input(s): PROCALCITON, LATICACIDVEN in the last 168 hours.  Recent Results (from the past 240 hour(s))  Urine culture     Status: None   Collection Time: 03/31/17  5:20 AM  Result Value Ref Range Status   Specimen Description URINE, RANDOM  Final   Special Requests NONE  Final   Culture NO GROWTH  Final   Report Status 04/01/2017 FINAL  Final  Culture, blood (routine x 2)  Status: None (Preliminary result)   Collection Time: 03/31/17  7:37 AM  Result Value Ref Range Status   Specimen Description BLOOD RIGHT HAND  Final   Special Requests IN PEDIATRIC BOTTLE Blood Culture adequate volume  Final   Culture NO GROWTH 4 DAYS  Final   Report Status PENDING  Incomplete  Culture, blood (routine x 2)     Status: None (Preliminary result)   Collection Time: 03/31/17  7:47 AM  Result Value Ref Range Status   Specimen Description BLOOD LEFT HAND  Final   Special Requests IN PEDIATRIC BOTTLE Blood Culture adequate volume  Final   Culture NO GROWTH 4 DAYS  Final   Report Status PENDING  Incomplete  MRSA PCR Screening     Status: None   Collection Time: 03/31/17  8:18 AM  Result Value Ref Range Status   MRSA by PCR NEGATIVE NEGATIVE Final    Comment:        The GeneXpert MRSA Assay (FDA approved for NASAL specimens only), is one component of a comprehensive MRSA colonization surveillance program. It is not intended to diagnose MRSA infection nor to guide or monitor treatment for MRSA infections.        Radiology  Studies: No results found.    Scheduled Meds: . aspirin EC  81 mg Oral Daily  . cloNIDine  0.1 mg Oral BID  . docusate sodium  100 mg Oral Q12H  . ezetimibe-simvastatin  1 tablet Oral QHS  . feeding supplement (ENSURE ENLIVE)  237 mL Oral BID BM  . heparin injection (subcutaneous)  5,000 Units Subcutaneous Q8H  . hydrocortisone   Rectal TID  . insulin aspart  0-9 Units Subcutaneous TID WC  . isosorbide-hydrALAZINE  1 tablet Oral BID  . nebivolol  20 mg Oral Daily  . pantoprazole  40 mg Oral Daily  . polyethylene glycol  17 g Oral Daily  . timolol  1 drop Left Eye Daily   Continuous Infusions: . sodium chloride Stopped (04/02/17 1600)     LOS: 4 days    Time spent: 40 minutes   Richarda Overlie, MD [facility] [email or web address] Password [email or web address] 04/04/2017, 1:37 PM

## 2017-04-04 NOTE — Care Management Important Message (Signed)
Important Message  Patient Details  Name: [name] MRN: [id number] Date of Birth: 03/29/1927   Medicare Important Message Given:  Yes    Alexxia Stankiewicz Stefan ChurchBratton 04/04/2017, 1:02 PM

## 2017-04-05 DIAGNOSIS — Z7189 Other specified counseling: Secondary | ICD-10-CM

## 2017-04-05 LAB — GLUCOSE, CAPILLARY
GLUCOSE-CAPILLARY: 163 mg/dL — AB (ref 65–99)
Glucose-Capillary: 145 mg/dL — ABNORMAL HIGH (ref 65–99)
Glucose-Capillary: 185 mg/dL — ABNORMAL HIGH (ref 65–99)
Glucose-Capillary: 93 mg/dL (ref 65–99)

## 2017-04-05 LAB — CULTURE, BLOOD (ROUTINE X 2)
CULTURE: NO GROWTH
Culture: NO GROWTH
SPECIAL REQUESTS: ADEQUATE
Special Requests: ADEQUATE

## 2017-04-05 NOTE — Progress Notes (Addendum)
PROGRESS NOTE    [name]  ZOX:[id number] DOB: 08/22/1927 DOA: 03/31/2017 PCP: Kirt Boysarter, Monica, DO     Brief Narrative:  [name] is a 84 yo female with past medical history significant for essential hypertension, chronic kidney disease stage III, chronic combined systolic and diastolic heart failure, dementia, type 2 diabetes, rectal prolapse. She [facility] ED 03[facility]31/17 with SOB and chest pain. In ED, she had SpO2 in 80s on room air and she was found to have signs of volume overload. She also had hypertensive urgency with BP of 180 / 100. CXR demonstrated interstitial edema and BNP was 1,500. She was started on BiPAP for increased WOB; however, she failed and ultimately required intubation.  Patient self extubated on 1/12 and [facility]  Upon chart review, this is her fourth admission for same diagnosis of acute combined systolic/diastolic heart failure and respiratory failure in the past 2 months. Patient resides with daughter. Daughter initially states that patient is very compliant with medications, then later adds that patient will sometimes skip her lasix dose as she does not want to urinate so much.  Also when questioned regarding low-sodium diet, daughter states that patient is very compliant with low-sodium diet.  With further probing, daughter admits that patient's typical diet consists of soup, hamburger and mashed potatoes.  Assessment & Plan:   Active Problems:   Pressure injury of skin   Respiratory failure (HCC)   Protein-calorie malnutrition, severe   Palliative care by specialist   Advance care planning   Goals of care, counseling/discussion   Acute hypoxemic respiratory failure secondary to acute exacerbation of systolic and diastolic heart failure -Patient continues to be noncompliant with medications and diet at home and now returns for her fourth hospitalization in the past 2 months -Intubated on admission 1/11, self  extubated on 1/12  -BNP 1499.8 on admission -Echo EF 40%  -Dietitian consult for diet education for family  -DC'd  IV Lasix, as cr   3.0 on 1/15, weight up from 104 lbs >123lbs  in 4 days despite being on diuretics , diuretics now placed on hold She is documented to have about 1500 mL of urine output overnight Currently on room air and asymptomatic  AKI on chronic kidney disease stage III -Baseline creatinine 2.1, creatinine of close to 3.0 -Worsening in setting of lasix, continue to monitor BMP  Held lasix for now Given her lack of insight/paranoia she's not a candidate for dialysis, therefore no nephrology consult will be obtained. SHE HAS ALSO REFUSED  LABS TODAY   Essential hypertension -Continue bidil, catapres, nebivolol   Paroxysmal A Fib -New onset this admission, required cardizem gtt. Now off. Continue telemetry   DM well controlled  -Ha1c 6.5 -SSI  Hyperlipidemia -Continue vytorin   Dementia -Continue supportive care  Rectal prolapse -Was deemed nonsurgical candidate during previous hospitalization  Goals of care -Patient with dementia, resides at home with daughter. Now returns for 4th hospitalization for same dx and medical noncompliance. Consulted palliative care medicine for goals of care discussion. May transition to hospice   DVT prophylaxis: subq hep  Code Status: Full Family Communication: Daughter at bedside Disposition Plan: Pending decision as per palliative care recommendations   Consultants:   PCCM admission   Antimicrobials:  Anti-infectives (From admission, onward)   None       Subjective: Awake   but confused, still paranoid    Objective: Vitals:   04/04/17 1425 04/04/17 2157 04/05/17  0500 04/05/17 0828  BP: (!) 129/38 (!) 168/50 (!) 174/62 (!) 151/53  Pulse: 66 64 62   Resp: 15 16 18    Temp: 98.4 F (36.9 C) 98.3 F (36.8 C) 97.6 F (36.4 C)   TempSrc: Oral Oral Oral   SpO2: 100% 92% 97%   Weight:      Height:         Intake/Output Summary (Last 24 hours) at 04/05/2017 1003 Last data filed at 04/04/2017 1700 Gross per 24 hour  Intake 1430 ml  Output -  Net 1430 ml   Filed Weights   04/01/17 0436 04/02/17 0316 04/04/17 0534  Weight: 53.6 kg (118 lb 2.7 oz) 52.3 kg (115 lb 4.8 oz) 56 kg (123 lb 7.3 oz)    Examination:  General exam: confused  Respiratory system: Respiratory effort normal. On [place] O2, crackles bilaterally  Cardiovascular system: S1 & S2 heard. No JVD, murmurs, rubs, gallops or clicks. No pedal edema. Gastrointestinal system: Abdomen is nondistended, soft and nontender. No organomegaly or masses felt. Normal bowel sounds heard. Central nervous system: Alert  Extremities: Symmetric  Skin: No rashes, lesions or ulcers Psychiatry: +Dementia   Data Reviewed: I have personally reviewed following labs and imaging studies  CBC: Recent Labs  Lab 03/31/17 0202 04/01/17 0450 04/02/17 0413 04/03/17 1001 04/04/17 0258  WBC 7.6 7.2 6.1 6.5 4.4  HGB 10.0* 8.6* 8.8* 9.6* 7.8*  HCT 32.3* 27.9* 29.1* 31.0* 24.5*  MCV 95.0 94.3 96.4 95.4 93.2  PLT 259 180 170 220 175   Basic Metabolic Panel: Recent Labs  Lab 04/01/17 0450 04/02/17 0008 04/02/17 0413 04/03/17 1001 04/04/17 0258  NA 140 142 143 139 139  K 4.0 3.9 3.9 3.9 4.2  CL 110 108 109 105 105  CO2 20* 21* 20* 19* 21*  GLUCOSE 138* 154* 87 209* 165*  BUN 46* 52* 56* 65* 73*  CREATININE 2.25* 2.32* 2.32* 2.84* 3.00*  CALCIUM 8.1* 8.7* 8.8* 9.0 8.3*  MG 2.2 2.4 2.4  --   --   PHOS 4.0  --  5.5*  --   --    GFR: Estimated Creatinine Clearance: 10.4 mL/min (A) (by C-G formula based on SCr of 3 mg/dL (H)). Liver Function Tests: Recent Labs  Lab 04/01/17 0450  AST 15  ALT 8*  ALKPHOS 46  BILITOT 0.6  PROT 5.2*  ALBUMIN 2.7*   No results for input(s): LIPASE, AMYLASE in the last 168 hours. No results for input(s): AMMONIA in the last 168 hours. Coagulation Profile: No results for input(s): INR, PROTIME in the last  168 hours. Cardiac Enzymes: Recent Labs  Lab 03/31/17 0741 03/31/17 1525 03/31/17 2135  TROPONINI 0.06* 0.06* 0.06*   BNP (last 3 results) No results for input(s): PROBNP in the last 8760 hours. HbA1C: No results for input(s): HGBA1C in the last 72 hours. CBG: Recent Labs  Lab 04/04/17 0953 04/04/17 1327 04/04/17 1742 04/04/17 2315 04/05/17 0737  GLUCAP 175* 143* 106* 159* 145*   Lipid Profile: No results for input(s): CHOL, HDL, LDLCALC, TRIG, CHOLHDL, LDLDIRECT in the last 72 hours. Thyroid Function Tests: No results for input(s): TSH, T4TOTAL, FREET4, T3FREE, THYROIDAB in the last 72 hours. Anemia Panel: No results for input(s): VITAMINB12, FOLATE, FERRITIN, TIBC, IRON, RETICCTPCT in the last 72 hours. Sepsis Labs: No results for input(s): PROCALCITON, LATICACIDVEN in the last 168 hours.  Recent Results (from the past 240 hour(s))  Urine culture     Status: None   Collection Time: 03/31/17  5:20  AM  Result Value Ref Range Status   Specimen Description URINE, RANDOM  Final   Special Requests NONE  Final   Culture NO GROWTH  Final   Report Status 04/01/2017 FINAL  Final  Culture, blood (routine x 2)     Status: None (Preliminary result)   Collection Time: 03/31/17  7:37 AM  Result Value Ref Range Status   Specimen Description BLOOD RIGHT HAND  Final   Special Requests IN PEDIATRIC BOTTLE Blood Culture adequate volume  Final   Culture NO GROWTH 4 DAYS  Final   Report Status PENDING  Incomplete  Culture, blood (routine x 2)     Status: None (Preliminary result)   Collection Time: 03/31/17  7:47 AM  Result Value Ref Range Status   Specimen Description BLOOD LEFT HAND  Final   Special Requests IN PEDIATRIC BOTTLE Blood Culture adequate volume  Final   Culture NO GROWTH 4 DAYS  Final   Report Status PENDING  Incomplete  MRSA PCR Screening     Status: None   Collection Time: 03/31/17  8:18 AM  Result Value Ref Range Status   MRSA by PCR NEGATIVE NEGATIVE Final     Comment:        The GeneXpert MRSA Assay (FDA approved for NASAL specimens only), is one component of a comprehensive MRSA colonization surveillance program. It is not intended to diagnose MRSA infection nor to guide or monitor treatment for MRSA infections.        Radiology Studies: No results found.    Scheduled Meds: . aspirin EC  81 mg Oral Daily  . cloNIDine  0.1 mg Oral BID  . docusate sodium  100 mg Oral Q12H  . ezetimibe-simvastatin  1 tablet Oral QHS  . feeding supplement (ENSURE ENLIVE)  237 mL Oral BID BM  . heparin injection (subcutaneous)  5,000 Units Subcutaneous Q8H  . hydrocortisone   Rectal TID  . insulin aspart  0-9 Units Subcutaneous TID WC  . isosorbide-hydrALAZINE  1 tablet Oral BID  . nebivolol  20 mg Oral Daily  . OLANZapine zydis  5 mg Oral QHS  . pantoprazole  40 mg Oral Daily  . polyethylene glycol  17 g Oral Daily  . timolol  1 drop Left Eye Daily   Continuous Infusions: . sodium chloride Stopped (04/02/17 1600)     LOS: 5 days    Time spent: 40 minutes   Richarda Overlie, MD [facility] [email or web address] Password [email or web address] 04/05/2017, 10:03 AM

## 2017-04-05 NOTE — Progress Notes (Signed)
Lab was called and was asked to come back and try again to get lab work on the patient.

## 2017-04-05 NOTE — Progress Notes (Signed)
Patient refusing labs to be drawn. Coud not talk her in to it

## 2017-04-05 NOTE — Progress Notes (Signed)
Patient refused labs twice today and was educated but she still refused. MD will be notified.

## 2017-04-05 NOTE — Progress Notes (Signed)
MD wants to know patient's Cr level however results still are not posted, I will continue to monitor.

## 2017-04-05 NOTE — Progress Notes (Signed)
Patient had opportunity to do AD earlier in day and refused.  She requested AD again.  I visited patient and husband. They wanted to do AD.  Completed forms and had notary and volunteers to come and sign it. Phebe CollaDonna S Anjel Perfetti, Chaplain   04/05/17 1500  Clinical Encounter Type  Visited With Patient and family together  Visit Type Initial;Other (Comment) (requested AD)  Referral From Patient  Consult/Referral To Chaplain  Spiritual Encounters  Spiritual Needs Literature;Prayer

## 2017-04-05 NOTE — Progress Notes (Addendum)
MD notified of I was just given report that the patient refused lab work earlier today. Waiting on creatinine level. MD wants to keep the foley one more day.

## 2017-04-05 NOTE — Progress Notes (Signed)
[facility]  Patient Status: Active (receiving services up to time of hospitalization)  [facility] is providing the following services: RN and PT  If patient discharges after hours, please call [phone].   [name] 04/05/2017, 4:40 PM

## 2017-04-05 NOTE — Progress Notes (Signed)
Daily Progress Note   Patient Name: Ashley RheinDoris E Walters       Date: 04/05/2017 DOB: 09-15-1927  Age: 84 y.o. MRN#: [id number] Attending Physician: Richarda OverlieAbrol, Nayana, MD Primary Care Physician: Kirt Boysarter, Monica, DO Admit Date: 03/31/2017  Reason for Consultation/Follow-up: Establishing goals of care  Subjective: Pt in bed asleep. Arouses easily. States she is cold. Tells me she remembers talking to me yesterday. Cannot recount her heart condition. Attempted limited GOC discussion with her per her daughter's request. [name] says that if her lifetime was limited she would want to be at home, surrounded by her cats. She would want to die in her home, with her cats, and be buried with her cats. I asked her about resuscitation and she tells me that when God calls her home, she is ready to go. She then expresses a paranoid delusion about "someone upstairs coming downstairs and trying to put her down".  I was able to return her focus to her GOC of going home. I asked her how she would feel about having Hospice assistance at home and she said she would be accepting of that.  Spoke with daughter's [name] and [name]. Relayed conversation that I had with [name] to them.  They are reluctant to make decisions on her behalf without the patient expressing it as her idea to them. I pointed out my conversation with her today, and what she has expressed when she was clear minded. [name] and [name] did say they would not want patient to go to SNF, however, they are unsure they can care for her at home.  Discussed what assistance Hospice vs palliative could provide.   Review of Systems  Unable to perform ROS: Dementia    Length of Stay: 5  Current Medications: Scheduled Meds:  . aspirin EC  81 mg Oral Daily  . cloNIDine   0.1 mg Oral BID  . docusate sodium  100 mg Oral Q12H  . ezetimibe-simvastatin  1 tablet Oral QHS  . feeding supplement (ENSURE ENLIVE)  237 mL Oral BID BM  . heparin injection (subcutaneous)  5,000 Units Subcutaneous Q8H  . hydrocortisone   Rectal TID  . insulin aspart  0-9 Units Subcutaneous TID WC  . isosorbide-hydrALAZINE  1 tablet Oral BID  . nebivolol  20 mg Oral Daily  . OLANZapine zydis  5 mg Oral QHS  . pantoprazole  40 mg Oral Daily  . polyethylene glycol  17 g Oral Daily  . timolol  1 drop Left Eye Daily    Continuous Infusions: . sodium chloride Stopped (04/02/17 1600)    PRN Meds: sodium chloride, hydrALAZINE  Physical Exam  Constitutional:  frail  Pulmonary/Chest: Effort normal and breath sounds normal.  Abdominal: Soft. Bowel sounds are normal.  Neurological:  Arouses easily, not oriented to place  Skin: Skin is warm and dry.  Psychiatric:  Judgement and though content impaired, paranoid  Nursing note and vitals reviewed.           Vital Signs: BP (!) 151/53   Pulse 62   Temp 97.6 F (36.4 C) (Oral)   Resp 18   Ht 5' (1.524 m)   Wt 56 kg (123 lb 7.3 oz)   SpO2 97%   BMI 24.11 kg/m  SpO2: SpO2: 97 % O2 Device: O2 Device: Not Delivered O2 Flow Rate: O2 Flow Rate (L/min): 2 L/min  Intake/output summary:   Intake/Output Summary (Last 24 hours) at 04/05/2017 1406 Last data filed at 04/05/2017 1136 Gross per 24 hour  Intake 240 ml  Output 1100 ml  Net -860 ml   LBM: Last BM Date: 04/04/17 Baseline Weight: Weight: 47.2 kg (104 lb) Most recent weight: Weight: 56 kg (123 lb 7.3 oz)       Palliative Assessment/Data: PPS: 30%     Patient Active Problem List   Diagnosis Date Noted  . Palliative care by specialist   . Advance care planning   . Goals of care, counseling/discussion   . Protein-calorie malnutrition, severe 04/01/2017  . Respiratory failure (HCC) 03/31/2017  . Pressure injury of skin 03/21/2017  . Acute renal failure  superimposed on stage 3 chronic kidney disease (HCC) 03/20/2017  . Acute on chronic combined systolic (congestive) and diastolic (congestive) heart failure (HCC) 03/20/2017  . CHF (congestive heart failure) (HCC) 03/09/2017  . Acute kidney injury superimposed on CKD (HCC)stage III 02/07/2017  . Acute on chronic combined systolic and diastolic CHF (congestive heart failure) (HCC) 02/05/2017  . Urinary tract infection without hematuria   . Hypoxia 02/04/2017  . Abnormal CXR 02/04/2017  . Accelerated hypertension 02/04/2017  . LBBB (left bundle branch block) 02/04/2017  . Diarrhea 12/09/2016  . Delusions (HCC) 09/28/2016  . Medication noncompliance due to cognitive impairment 09/28/2016  . Hemorrhoids 05/04/2016  . Pain in joint, lower leg 01/20/2016  . Ptosis 11/04/2015  . Glaucoma 08/25/2015  . Dementia without behavioral disturbance 06/10/2015  . Expressive aphasia 05/26/2015  . Rectal prolapse 02/25/2015  . CKD (chronic kidney disease), stage III (HCC) 02/10/2014  . PAD (peripheral artery disease) (HCC) 01/17/2014  . Constipation 07/31/2013  . Atherosclerosis of native artery of extremity with intermittent claudication (HCC) 07/04/2013  . Anxiety disorder 02/21/2013  . Diabetes mellitus with nephropathy (HCC) 07/05/2012  . Occlusion and stenosis of carotid artery without mention of cerebral infarction 06/21/2012  . Coronary artery disease   . Chronic diastolic heart failure, NYHA class 1 (HCC)   . Hyperlipidemia   . Hypertension   . Carotid bruit   . Gout   . Obesity   . TIA (transient ischemic attack)     Palliative Care Assessment & Plan   Patient Profile: 84 y.o. female  with past medical history of Diastolic and systolic CHF (EF 16%), CKD, CAD, DM, HTN, hx CVA, dementia w/ behavior changes admitted on 03/31/2017 with respiratory failure r/t CHF exacerbation.  She presented with SOB and chest pain, desated on NRB and bipap, and was eventually intubated. This is her fourth  admission in the last two months for the same condition. Patient has been unable to follow recommendations to be compliant with plan of care for CHF exacerbations due to her dementia- this has resulted in the ongoing admissions. Palliative medicine consulted for GOC.  Assessment/Recommendations/Plan   Continue Zyprexa QHS  [name] plans to contact me in the morning of 1/17 re: Hospice referral after discussing with her Mother- I cautioned her and discussed with family that patient is unable to cognitively make best decisions for herself and that at some point the family is going to need to be decision maker, keeping in mind what her mother would have wanted when she was clear minded.  [name] requested contact with patient's Cardiologist NP- Annice Needy re: does [name] think Hospice is appropriate for this patient- I will contact [name] and ask her to call [name].   Goals of Care and Additional Recommendations:  Limitations on Scope of Treatment: Full Scope Treatment  Code Status:  Full code  Prognosis:   < 6 months  Discharge Planning:  To Be Determined  Care plan was discussed with patient's daughters- [name] and Ashley Bray.   Thank you for allowing the Palliative Medicine Team to assist in the care of this patient.   Time In: 1400 Time Out: 1500 Total Time 60 minutes Prolonged Time Billed Yes      Greater than 50%  of this time was spent counseling and coordinating care related to the above assessment and plan.  [name], AGNP-C Palliative Medicine   Please contact Palliative Medicine Team phone at [phone] for questions and concerns.

## 2017-04-05 NOTE — Progress Notes (Signed)
MD was notified of patient refusing labs twice and that she was educated.

## 2017-04-06 DIAGNOSIS — Z4659 Encounter for fitting and adjustment of other gastrointestinal appliance and device: Secondary | ICD-10-CM

## 2017-04-06 LAB — COMPREHENSIVE METABOLIC PANEL
ALT: 12 U/L — ABNORMAL LOW (ref 14–54)
ANION GAP: 12 (ref 5–15)
AST: 13 U/L — ABNORMAL LOW (ref 15–41)
Albumin: 2.6 g/dL — ABNORMAL LOW (ref 3.5–5.0)
Alkaline Phosphatase: 46 U/L (ref 38–126)
BILIRUBIN TOTAL: 0.7 mg/dL (ref 0.3–1.2)
BUN: 83 mg/dL — AB (ref 6–20)
CO2: 22 mmol/L (ref 22–32)
Calcium: 8.2 mg/dL — ABNORMAL LOW (ref 8.9–10.3)
Chloride: 107 mmol/L (ref 101–111)
Creatinine, Ser: 2.94 mg/dL — ABNORMAL HIGH (ref 0.44–1.00)
GFR calc Af Amer: 15 mL/min — ABNORMAL LOW (ref >60)
GFR calc non Af Amer: 13 mL/min — ABNORMAL LOW (ref >60)
Glucose, Bld: 152 mg/dL — ABNORMAL HIGH (ref 65–99)
POTASSIUM: 4.6 mmol/L (ref 3.5–5.1)
Sodium: 141 mmol/L (ref 135–145)
TOTAL PROTEIN: 5 g/dL — AB (ref 6.5–8.1)

## 2017-04-06 LAB — GLUCOSE, CAPILLARY
GLUCOSE-CAPILLARY: 148 mg/dL — AB (ref 65–99)
GLUCOSE-CAPILLARY: 160 mg/dL — AB (ref 65–99)
Glucose-Capillary: 245 mg/dL — ABNORMAL HIGH (ref 65–99)
Glucose-Capillary: 190 mg/dL — ABNORMAL HIGH (ref 65–99)

## 2017-04-06 NOTE — Progress Notes (Addendum)
Daily Progress Note   Patient Name: [name]       Date: 04/06/2017 DOB: 11/11/1927  Age: 84 y.o. MRN#: [id number] Attending Physician: Richarda Overlie, MD Primary Care Physician: Kirt Boys, DO Admit Date: 03/31/2017  Reason for Consultation/Follow-up: Establishing goals of care and Hospice Evaluation  Subjective: Patient awake, sitting up in chair. Audible wheezing. Loyola Mast at bedside. Patient does not recall our conversation from yesterday. She cannot elicit to me her heart condition- tells me she is in the hospital for problems with her bowels.  Then again proceeds with her delusion that people in the hospital are trying to kill her. Loyola Mast, facilitates this delusion and encourages it. Per [name] (patient's HCPOA and daughter) Annette Stable has history of TBI and likely has dementia as well.  No family at bedside. Attempted to make contact with [name]. Left message requesting return call.  ROS  Length of Stay: 6  Current Medications: Scheduled Meds:  . aspirin EC  81 mg Oral Daily  . cloNIDine  0.1 mg Oral BID  . docusate sodium  100 mg Oral Q12H  . ezetimibe-simvastatin  1 tablet Oral QHS  . feeding supplement (ENSURE ENLIVE)  237 mL Oral BID BM  . heparin injection (subcutaneous)  5,000 Units Subcutaneous Q8H  . hydrocortisone   Rectal TID  . insulin aspart  0-9 Units Subcutaneous TID WC  . isosorbide-hydrALAZINE  1 tablet Oral BID  . nebivolol  20 mg Oral Daily  . OLANZapine zydis  5 mg Oral QHS  . pantoprazole  40 mg Oral Daily  . polyethylene glycol  17 g Oral Daily  . timolol  1 drop Left Eye Daily    Continuous Infusions: . sodium chloride Stopped (04/02/17 1600)    PRN Meds: sodium chloride, hydrALAZINE  Physical Exam  Constitutional:  frail    Pulmonary/Chest: Effort normal.  Neurological: She is alert. She is disoriented.  Skin: There is pallor.  Psychiatric: Her mood appears anxious.  Paranoid, delusional, judgement impaired  Nursing note and vitals reviewed.           Vital Signs: BP (!) 153/54 (BP Location: Right Arm)   Pulse 64   Temp 97.7 F (36.5 C) (Oral)   Resp 18   Ht 5' (1.524 m)   Wt 59.6 kg (131 lb 6.3 oz)  SpO2 98%   BMI 25.66 kg/m  SpO2: SpO2: 98 % O2 Device: O2 Device: Nasal Cannula O2 Flow Rate: O2 Flow Rate (L/min): 2 L/min  Intake/output summary:   Intake/Output Summary (Last 24 hours) at 04/06/2017 1527 Last data filed at 04/06/2017 0646 Gross per 24 hour  Intake -  Output 1200 ml  Net -1200 ml   LBM: Last BM Date: 04/05/17 Baseline Weight: Weight: 47.2 kg (104 lb) Most recent weight: Weight: 59.6 kg (131 lb 6.3 oz)       Palliative Assessment/Data:    Flowsheet Rows     Most Recent Value  Intake Tab  Referral Department  Hospitalist  Unit at Time of Referral  Med/Surg Unit  Palliative Care Primary Diagnosis  Cardiac  Date Notified  04/03/17  Palliative Care Type  Return patient Palliative Care  Reason for referral  Clarify Goals of Care  Date of Admission  03/31/17  Date first seen by Palliative Care  04/04/17  # of days Palliative referral response time  1 Day(s)  # of days IP prior to Palliative referral  3  Clinical Assessment  Psychosocial & Spiritual Assessment  Palliative Care Outcomes      Patient Active Problem List   Diagnosis Date Noted  . Palliative care by specialist   . Advance care planning   . Goals of care, counseling/discussion   . Protein-calorie malnutrition, severe 04/01/2017  . Respiratory failure (HCC) 03/31/2017  . Pressure injury of skin 03/21/2017  . Acute renal failure superimposed on stage 3 chronic kidney disease (HCC) 03/20/2017  . Acute on chronic combined systolic (congestive) and diastolic (congestive) heart failure (HCC) 03/20/2017   . CHF (congestive heart failure) (HCC) 03/09/2017  . Acute kidney injury superimposed on CKD (HCC)stage III 02/07/2017  . Acute on chronic combined systolic and diastolic CHF (congestive heart failure) (HCC) 02/05/2017  . Urinary tract infection without hematuria   . Hypoxia 02/04/2017  . Abnormal CXR 02/04/2017  . Accelerated hypertension 02/04/2017  . LBBB (left bundle branch block) 02/04/2017  . Diarrhea 12/09/2016  . Delusions (HCC) 09/28/2016  . Medication noncompliance due to cognitive impairment 09/28/2016  . Hemorrhoids 05/04/2016  . Pain in joint, lower leg 01/20/2016  . Ptosis 11/04/2015  . Glaucoma 08/25/2015  . Dementia without behavioral disturbance 06/10/2015  . Expressive aphasia 05/26/2015  . Rectal prolapse 02/25/2015  . CKD (chronic kidney disease), stage III (HCC) 02/10/2014  . PAD (peripheral artery disease) (HCC) 01/17/2014  . Constipation 07/31/2013  . Atherosclerosis of native artery of extremity with intermittent claudication (HCC) 07/04/2013  . Anxiety disorder 02/21/2013  . Diabetes mellitus with nephropathy (HCC) 07/05/2012  . Occlusion and stenosis of carotid artery without mention of cerebral infarction 06/21/2012  . Coronary artery disease   . Chronic diastolic heart failure, NYHA class 1 (HCC)   . Hyperlipidemia   . Hypertension   . Carotid bruit   . Gout   . Obesity   . TIA (transient ischemic attack)     Palliative Care Assessment & Plan   Patient Profile:  84 y.o. female  with past medical history of Diastolic and systolic CHF (EF 14%45%), CKD, CAD, DM, HTN, hx CVA, dementia w/ behavior changes admitted on 03/31/2017 with respiratory failure r/t CHF exacerbation. She presented with SOB and chest pain, desated on NRB and bipap, and was eventually intubated. This is her fourth admission in the last two months for the same condition. Palliative medicine consulted for GOC.      Assessment/Recommendations/Plan  Patient clearly cannot make  medical decisions for herself  Recommendation for DNR and referral to Hospice for support at home has been discussed with [name]- she was to touch base with me today for further discussion, but has not returned my call  PMT will continue to follow and try to reach [name]  Addendum: Spoke with Massie Bougie requests DNR and referral for Hospice services at home. Additionally, [name] is concerned re: Advanced Directives that were created today by Chaplain- attempted to contact Chaplain- however, have not been in touch. Patient clearly does not have capacity to make decisions or sign Advanced Directives Paperwork- anypaperwork completed today is invalid. Additionally, [name] is concerned re: patient giving her checkbook to friend- Annette Stable who seems to be encouraging her delusions and paranoia. [name] is interested in pursuing full guardianship and having patient declared incompetent- I have placed social work consult to assist with this.   Goals of Care and Additional Recommendations:  Limitations on Scope of Treatment: Minimize Medications  Code Status:  DNR  Prognosis:   < 6 months  Discharge Planning:  To Be Determined  Thank you for allowing the Palliative Medicine Team to assist in the care of this patient.   Time In: 0900 1545 Time Out: 0930 1610 Total Time 50 mins Prolonged Time Billed Yes      Greater than 50%  of this time was spent counseling and coordinating care related to the above assessment and plan.  Ocie Bob, AGNP-C Palliative Medicine   Please contact Palliative Medicine Team phone at [phone] for questions and concerns.

## 2017-04-06 NOTE — Progress Notes (Signed)
Visited with this patient and her friend Annette StableBill who is at bedside.  Patient tells me the story of nurses on another floor trying to kill her.  Friend at bedside confirms the story and continues to encourage her to talk more about it.  Bill asked this Chaplain if I do weddings and shows paperwork that he says they are in process of completing for their marriage license.  Patient ask me if I can notarize financial documents so she can leave all of her things to him and not her daughters.  Patient and companion both informed of our not being able to get involved with financial manners.  Not willing to perform wedding. This Chaplain will refer this case to Senior staff chaplain to provide follow up if needed.   Spoke with nurse of this patient who confirms this patient is seriously confused and shares stories.     04/06/17 1943  Clinical Encounter Type  Visited With Patient;Family;Health care provider  Visit Type Initial;Psychological support;Spiritual support

## 2017-04-06 NOTE — Progress Notes (Signed)
PROGRESS NOTE    Ashley RheinDoris E Gao  ZOX:[id number] DOB: 29-Aug-1927 DOA: 03/31/2017 PCP: Kirt Boysarter, Monica, DO     Brief Narrative:  [name] is a 84 yo female with past medical history significant for essential hypertension, chronic kidney disease stage III, chronic combined systolic and diastolic heart failure, dementia, type 2 diabetes, rectal prolapse. [facility]17 with SOB and chest pain. In ED, she had SpO2 in 80s on room air and she was found to have signs of volume overload. She also had hypertensive urgency with BP of 180 / 100. CXR demonstrated interstitial edema and BNP was 1,500. She was started on BiPAP for increased WOB; however, she failed and ultimately required intubation.  Patient self extubated on 1/12 and [facility]  THIS IS  fourth admission for same diagnosis of acute combined systolic/diastolic heart failure and respiratory failure in the past 2 months. Patient resides with daughter. Daughter initially states that patient is very compliant with medications, then later adds that patient will sometimes skip her lasix dose as she does not want to urinate so much.  Also when questioned regarding low-sodium diet, daughter states that patient is very compliant with low-sodium diet.   Currently patient is extremely paranoid and does not have capacity to make medical decisions. Palliative care has been requested to determine if patient is hospice eligible from a CHF/COPD standpoint and also clarify goals of care.  Assessment & Plan:   Active Problems:   Pressure injury of skin   Respiratory failure (HCC)   Protein-calorie malnutrition, severe   Palliative care by specialist   Advance care planning   Goals of care, counseling/discussion   Acute hypoxemic respiratory failure secondary to acute exacerbation of systolic and diastolic heart failure  now returns for her fourth hospitalization in the past 2 months -Intubated on  admission 1/11, self extubated on 1/12  -BNP 1499.8 on admission -Echo EF 40%  -Dietitian consult for diet education for family  -DC'd  IV Lasix, as cr   3.0 on 1/15, weight up from 104 lbs >131lbs  in 4 days despite being on diuretics , diuretics now placed on hold She is documented to have about 1200 mL of urine output overnight  Renal function unchanged BUN/creatinine   83/2.94    AKI on chronic kidney disease stage III -Baseline creatinine 2.1, creatinine  close to 3.0 now -Worsening in setting of lasix, continue to monitor BMP  Hold lasix for now Given her lack of insight/paranoia she's not a candidate for dialysis, therefore no nephrology consult will be obtained. She has also been refusing labs intermittently Suspect that given her paranoia and noncompliance. She will not be a candidate for recurrent admissions for CHF exacerbation and may qualify for home hospice    Essential hypertension -Continue bidil, catapres, nebivolol   Paroxysmal A Fib -New onset this admission, required cardizem gtt. Now off. Continue telemetry   DM well controlled  -Ha1c 6.5 -SSI  Hyperlipidemia -Continue vytorin   Dementia -Continue supportive care  Rectal prolapse -Was deemed nonsurgical candidate during previous hospitalization  Goals of care -Patient with dementia, resides at home with daughter. Now returns for 4th hospitalization for same dx and medical noncompliance. Consulted palliative care medicine for goals of care discussion. May transition to hospice. Discussed with daughter is the POA and clarified that the patient does not have capacity to make her own medical decisions because of her noncompliance and paranoia.   DVT  prophylaxis: subq hep  Code Status: Full Family Communication: Daughter at bedside Disposition Plan: Pending decision as per palliative care recommendations   Consultants:   PCCM admission   Antimicrobials:  Anti-infectives (From admission, onward)    None       Subjective:  Stable from a pulmonary standpoint, no chest pain or shortness of breath  Objective: Vitals:   04/05/17 1419 04/05/17 2206 04/06/17 0529 04/06/17 1421  BP: (!) 117/57 (!) 175/81 (!) 169/49 (!) 153/54  Pulse: 61 (!) 58 (!) 59 64  Resp: 20 18 18 18   Temp: 98.4 F (36.9 C) 98.4 F (36.9 C) 98.4 F (36.9 C) 97.7 F (36.5 C)  TempSrc: Oral Oral Oral Oral  SpO2: 99% 100% 98% 98%  Weight:   59.6 kg (131 lb 6.3 oz)   Height:        Intake/Output Summary (Last 24 hours) at 04/06/2017 1518 Last data filed at 04/06/2017 0646 Gross per 24 hour  Intake -  Output 1200 ml  Net -1200 ml   Filed Weights   04/02/17 0316 04/04/17 0534 04/06/17 0529  Weight: 52.3 kg (115 lb 4.8 oz) 56 kg (123 lb 7.3 oz) 59.6 kg (131 lb 6.3 oz)    Examination:  General exam: confused  Respiratory system: Respiratory effort normal. On [place] O2, crackles bilaterally  Cardiovascular system: S1 & S2 heard. No JVD, murmurs, rubs, gallops or clicks. No pedal edema. Gastrointestinal system: Abdomen is nondistended, soft and nontender. No organomegaly or masses felt. Normal bowel sounds heard. Central nervous system: Alert  Extremities: Symmetric  Skin: No rashes, lesions or ulcers Psychiatry: +Dementia   Data Reviewed: I have personally reviewed following labs and imaging studies  CBC: Recent Labs  Lab 03/31/17 0202 04/01/17 0450 04/02/17 0413 04/03/17 1001 04/04/17 0258  WBC 7.6 7.2 6.1 6.5 4.4  HGB 10.0* 8.6* 8.8* 9.6* 7.8*  HCT 32.3* 27.9* 29.1* 31.0* 24.5*  MCV 95.0 94.3 96.4 95.4 93.2  PLT 259 180 170 220 175   Basic Metabolic Panel: Recent Labs  Lab 04/01/17 0450 04/02/17 0008 04/02/17 0413 04/03/17 1001 04/04/17 0258 04/06/17 0424  NA 140 142 143 139 139 141  K 4.0 3.9 3.9 3.9 4.2 4.6  CL 110 108 109 105 105 107  CO2 20* 21* 20* 19* 21* 22  GLUCOSE 138* 154* 87 209* 165* 152*  BUN 46* 52* 56* 65* 73* 83*  CREATININE 2.25* 2.32* 2.32* 2.84* 3.00* 2.94*    CALCIUM 8.1* 8.7* 8.8* 9.0 8.3* 8.2*  MG 2.2 2.4 2.4  --   --   --   PHOS 4.0  --  5.5*  --   --   --    GFR: Estimated Creatinine Clearance: 10.9 mL/min (A) (by C-G formula based on SCr of 2.94 mg/dL (H)). Liver Function Tests: Recent Labs  Lab 04/01/17 0450 04/06/17 0424  AST 15 13*  ALT 8* 12*  ALKPHOS 46 46  BILITOT 0.6 0.7  PROT 5.2* 5.0*  ALBUMIN 2.7* 2.6*   No results for input(s): LIPASE, AMYLASE in the last 168 hours. No results for input(s): AMMONIA in the last 168 hours. Coagulation Profile: No results for input(s): INR, PROTIME in the last 168 hours. Cardiac Enzymes: Recent Labs  Lab 03/31/17 0741 03/31/17 1525 03/31/17 2135  TROPONINI 0.06* 0.06* 0.06*   BNP (last 3 results) No results for input(s): PROBNP in the last 8760 hours. HbA1C: No results for input(s): HGBA1C in the last 72 hours. CBG: Recent Labs  Lab 04/05/17 1220 04/05/17  1615 04/05/17 2204 04/06/17 0746 04/06/17 1206  GLUCAP 163* 185* 93 148* 245*   Lipid Profile: No results for input(s): CHOL, HDL, LDLCALC, TRIG, CHOLHDL, LDLDIRECT in the last 72 hours. Thyroid Function Tests: No results for input(s): TSH, T4TOTAL, FREET4, T3FREE, THYROIDAB in the last 72 hours. Anemia Panel: No results for input(s): VITAMINB12, FOLATE, FERRITIN, TIBC, IRON, RETICCTPCT in the last 72 hours. Sepsis Labs: No results for input(s): PROCALCITON, LATICACIDVEN in the last 168 hours.  Recent Results (from the past 240 hour(s))  Urine culture     Status: None   Collection Time: 03/31/17  5:20 AM  Result Value Ref Range Status   Specimen Description URINE, RANDOM  Final   Special Requests NONE  Final   Culture NO GROWTH  Final   Report Status 04/01/2017 FINAL  Final  Culture, blood (routine x 2)     Status: None   Collection Time: 03/31/17  7:37 AM  Result Value Ref Range Status   Specimen Description BLOOD RIGHT HAND  Final   Special Requests IN PEDIATRIC BOTTLE Blood Culture adequate volume  Final    Culture NO GROWTH 5 DAYS  Final   Report Status 04/05/2017 FINAL  Final  Culture, blood (routine x 2)     Status: None   Collection Time: 03/31/17  7:47 AM  Result Value Ref Range Status   Specimen Description BLOOD LEFT HAND  Final   Special Requests IN PEDIATRIC BOTTLE Blood Culture adequate volume  Final   Culture NO GROWTH 5 DAYS  Final   Report Status 04/05/2017 FINAL  Final  MRSA PCR Screening     Status: None   Collection Time: 03/31/17  8:18 AM  Result Value Ref Range Status   MRSA by PCR NEGATIVE NEGATIVE Final    Comment:        The GeneXpert MRSA Assay (FDA approved for NASAL specimens only), is one component of a comprehensive MRSA colonization surveillance program. It is not intended to diagnose MRSA infection nor to guide or monitor treatment for MRSA infections.        Radiology Studies: No results found.    Scheduled Meds: . aspirin EC  81 mg Oral Daily  . cloNIDine  0.1 mg Oral BID  . docusate sodium  100 mg Oral Q12H  . ezetimibe-simvastatin  1 tablet Oral QHS  . feeding supplement (ENSURE ENLIVE)  237 mL Oral BID BM  . heparin injection (subcutaneous)  5,000 Units Subcutaneous Q8H  . hydrocortisone   Rectal TID  . insulin aspart  0-9 Units Subcutaneous TID WC  . isosorbide-hydrALAZINE  1 tablet Oral BID  . nebivolol  20 mg Oral Daily  . OLANZapine zydis  5 mg Oral QHS  . pantoprazole  40 mg Oral Daily  . polyethylene glycol  17 g Oral Daily  . timolol  1 drop Left Eye Daily   Continuous Infusions: . sodium chloride Stopped (04/02/17 1600)     LOS: 6 days    Time spent: 40 minutes   Richarda Overlie, MD [facility] [email or web address] Password [facility] 04/06/2017, 3:18 PM

## 2017-04-07 DIAGNOSIS — Z9911 Dependence on respirator [ventilator] status: Secondary | ICD-10-CM

## 2017-04-07 DIAGNOSIS — F0391 Unspecified dementia with behavioral disturbance: Secondary | ICD-10-CM

## 2017-04-07 DIAGNOSIS — F03918 Unspecified dementia, unspecified severity, with other behavioral disturbance: Secondary | ICD-10-CM

## 2017-04-07 LAB — GLUCOSE, CAPILLARY
GLUCOSE-CAPILLARY: 186 mg/dL — AB (ref 65–99)
Glucose-Capillary: 165 mg/dL — ABNORMAL HIGH (ref 65–99)
Glucose-Capillary: 148 mg/dL — ABNORMAL HIGH (ref 65–99)

## 2017-04-07 MED ORDER — OXYCODONE HCL 20 MG/ML PO CONC: 5.0000 mg | ORAL | Status: DC | PRN

## 2017-04-07 MED ORDER — LORAZEPAM 0.5 MG PO TABS: 0.5000 mg | ORAL_TABLET | Freq: Four times a day (QID) | ORAL | Status: DC | PRN

## 2017-04-07 MED ORDER — OLANZAPINE 5 MG PO TBDP: 5.0000 mg | ORAL_TABLET | Freq: Every day | ORAL | 0 refills | Status: DC

## 2017-04-07 NOTE — Progress Notes (Signed)
Pt discharged to home, with home hospice. PIV removed. AVS reviewed with pt's daughter, Arna Mediciora. Pt to follow up with PCP. Pt left unit via wheelchair, daughter Arna Mediciora to transport pt home.

## 2017-04-07 NOTE — Progress Notes (Signed)
SATURATION QUALIFICATIONS: (This note is used to comply with regulatory documentation for home oxygen)  Patient Saturations on Room Air at Rest = 96%  Patient Saturations on Room Air while Ambulating = 93-94%

## 2017-04-07 NOTE — Care Management Note (Signed)
Case Management Note  Patient Details  Name: [name] MRN: [id number] Date of Birth: 1927/11/01  Subjective/Objective:                 Consulted by Palliative Team to set up Home Hospice. Spoke with patient's daughter [name] [phone] who states she would like to use [facility]. States [facility] (ambulatory sats today do not indicate O2 requirement), and sleeps in a recliner, has been for years. Would like a hospital bed, it is not necessary to have delivered prior to patient's arrival home. [place] [address] [place] [id number]. [name] states that either her or her sister will be able to be with patient at home 24/7. Daughter voiced concern over [name], who she states lives with her mother, and she is in the process of getting alternative housing for him, and will continue to do this once patient is home as to not make a scene at the hospital prior to DC. Referral placed to Ashley Barthelmy Evans RN [facility] [phone]. [name] directed to speak with daughter [name] and updated on DME needs and social situation.  Patient's daughter [name] will provide transportation home.    Action/Plan:   Expected Discharge Date:                  Expected Discharge Plan:  Home w Hospice Care  In-House Referral:     Discharge planning Services  CM Consult  Post Acute Care Choice:    Choice offered to:  Adult Children  DME Arranged:    [facility]  HH Arranged:    HH Agency:     Status of Service:  Completed, signed off  If discussed at Long Length of Stay Meetings, dates discussed:    Additional Comments:  Lawerance SabalDebbie [name], RN 04/07/2017, 12:13 PM
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
## 2017-04-07 NOTE — Discharge Summary (Signed)
Discharge Summary  [name] ZOX:[id number] DOB: [birth year]  PCP: Kirt Boys, DO  Admit date: 03/31/2017 Discharge date: 04/07/2017  Time spent: 25 minutes  Recommendations for Outpatient Follow-up:  1. Follow up with [facility] 2. Fall precaution 3. Follow up with your PCP and repeat BMP within a week to assess creatinine 4. Patient has refused labs 04/07/17 5. 2D echo 03/31/17 LVEF 40-45%  Discharge Diagnoses:  Active Hospital Problems   Diagnosis Date Noted  . Dementia with behavioral disturbance   . Palliative care by specialist   . Advance care planning   . Goals of care, counseling/discussion   . Protein-calorie malnutrition, severe 04/01/2017  . Respiratory failure (HCC) 03/31/2017  . Pressure injury of skin 03/21/2017    Resolved Hospital Problems  No resolved problems to display.    Discharge Condition: Stable  Diet recommendation: Resume previous diet as tolerated  Vitals:   04/07/17 0938 04/07/17 1339  BP:  (!) 176/84  Pulse:  (!) 59  Resp:  20  Temp:  (!) 97.2 F (36.2 C)  SpO2: 96% 92%    History of present illness:  [name] is a 84 yo female with past medical history significant for essential hypertension, chronic kidney disease stage III, chronic combined systolic and diastolic heart failure, dementia, type 2 diabetes, rectal prolapse. She [facility] to [facility] ED 03/31/17 with SOB and chest pain. In ED, she had SpO2 in 80s on room air and she was found to have signs of volume overload. She also had hypertensive urgency with BP of 180 / 100. CXR demonstrated interstitial edema and BNP was 1,500. She was started on BiPAP for increased WOB; however, she failed and ultimately required intubation.  Patient self extubated on 1/12 and patient was transferred to [facility] service on 1/14.  THIS IS  fourth admission for same diagnosis of acute combined systolic/diastolic heart failure and respiratory failure in the past 2  months. Patient resides with daughter. Daughter initially states that patient is very compliant with medications, then later adds that patient will sometimes skip her lasix dose as she does not want to urinate so much.  Also when questioned regarding low-sodium diet, daughter states that patient is very compliant with low-sodium diet.   Currently patient is extremely paranoid and does not have capacity to make medical decisions. Palliative care has been requested to determine if patient is hospice eligible from a CHF/COPD standpoint and also clarify goals of care.   On the day of discharge the patient was hemodynamically stable. Denies dyspnea or chest pain. Was ambulating and maintaining adequate O2 saturation on ambient air. Adequate urine output with incontinence, no constipation with 1 stool today. Family requests for [facility] services at home after discharge.    Hospital Course:  Active Problems:   Pressure injury of skin   Respiratory failure (HCC)   Protein-calorie malnutrition, severe   Palliative care by specialist   Advance care planning   Goals of care, counseling/discussion   Dementia with behavioral disturbance  Acute hypoxemic respiratory failure secondary to acute exacerbation of systolic and diastolic heart failure  now returns for her fourth hospitalization in the past 2 months -Intubated on admission 1/11, self extubated on 1/12  -BNP 1499.8 on admission -Echo EF 40%  -Dietitian consult for diet education for family  -DC'd  IV Lasix, as cr 3.0 on 1/15, weight up from 104 lbs >131lbs  in 4 days despite being on diuretics , diuretics now placed on  hold -RN reports good urine output with incontinence of urine -Renal function unchanged BUN/creatinine 83/2.94 -Patient refuses labs; unable to follow creatinine trends (04/07/17)   AKI on chronic kidney disease stage III -Baseline creatinine 2.1, creatinine  close to 3.0 now -Worsening in setting of lasix, continue to monitor BMP    -Pt refuses labs (04/07/17) -Given her lack of insight/paranoia she's not a candidate for dialysis, therefore no nephrology consult will be obtained. She has also been refusing labs.   Essential hypertension -Continue bidil, catapres, nebivolol   Paroxysmal A Fib -New onset this admission, required cardizem gtt. Now off. Continue telemetry   DM well controlled  -Ha1c 6.5  Hyperlipidemia -Continue vytorin   Dementia -Continue supportive care  Rectal prolapse -Was deemed nonsurgical candidate during previous hospitalization  Medical non compliance/Goals of care -Patient with dementia, resides at home with daughter. Now returns for 4th hospitalization for same dx and medical noncompliance. Consulted palliative care medicine for goals of care discussion. May transition to hospice. Refuses labs (04/07/17)  Procedures:  none  Consultations:  Palliative care team  Discharge Exam: BP (!) 176/84 (BP Location: Right Arm)   Pulse (!) 59   Temp (!) 97.2 F (36.2 C) (Oral)   Resp 20   Ht 5' (1.524 m)   Wt 56 kg (123 lb 7.3 oz)   SpO2 92%   BMI 24.11 kg/m   General: 84 yo CF WD WN NAD  Cardiovascular: RRR no rubs or gallops  Respiratory: CTA no wheezes or rales   Discharge Instructions You were cared for by a hospitalist during your hospital stay. If you have any questions about your discharge medications or the care you received while you were in the hospital after you are discharged, you can call the unit and asked to speak with the hospitalist on call if the hospitalist that took care of you is not available. Once you are discharged, your primary care physician will handle any further medical issues. Please note that NO REFILLS for any discharge medications will be authorized once you are discharged, as it is imperative that you return to your primary care physician (or establish a relationship with a primary care physician if you do not have one) for your aftercare needs  so that they can reassess your need for medications and monitor your lab values.   Allergies as of 04/07/2017      Reactions   Namzaric [memantine Hcl-donepezil Hcl] Nausea Only, Other (See Comments)   confusion   Percocet [oxycodone-acetaminophen] Other (See Comments)   loosy goosy   Sulfa Antibiotics Other (See Comments)   Tongue swells   Adhesive [tape] Other (See Comments)   Tears skin.  Please use "paper" tape   Metformin And Related Diarrhea, Other (See Comments)   Abnormal Kidney Functions    Tradjenta [linagliptin] Other (See Comments)   Low back ache, resolved once medication stopped    Penicillins Hives, Other (See Comments)   Tolerates Ceftriaxone, Has patient had a PCN reaction causing immediate rash, facial/tongue/throat swelling, SOB or lightheadedness with hypotension: Unknown Has patient had a PCN reaction causing severe rash involving mucus membranes or skin necrosis: No Has patient had a PCN reaction that required hospitalization No Has patient had a PCN reaction occurring within the last 10 years: No If all of the above answers are "NO", then may proceed with Cephalosporin use.      Medication List    STOP taking these medications   JANUVIA 100 MG tablet Generic drug:  sitaGLIPtin     TAKE these medications   aspirin EC 81 MG tablet Take 1 tablet (81 mg total) by mouth daily.   cloNIDine 0.1 MG tablet Commonly known as:  CATAPRES Take 1 tablet (0.1 mg total) by mouth 2 (two) times daily.   docusate sodium 100 MG capsule Commonly known as:  COLACE Take 1 capsule (100 mg total) by mouth every 12 (twelve) hours.   ezetimibe-simvastatin 10-20 MG tablet Commonly known as:  VYTORIN TAKE 1/2 TABLET BY MOUTH ONCE NIGHTLY TO CONTROL CHOLESTEROL   feeding supplement (ENSURE ENLIVE) Liqd Take 237 mLs by mouth 2 (two) times daily between meals.   furosemide 20 MG tablet Commonly known as:  LASIX TAKE ONE TABLET BY MOUTH TWICE DAILY FOR HYPERTENSION     hydrocortisone 2.5 % rectal cream Commonly known as:  PROCTOZONE-HC APPLY TO RECTUM THREE TIMES A DAY FOR DISCOMFORT   isosorbide-hydrALAZINE 20-37.5 MG tablet Commonly known as:  BIDIL Take 1 tablet by mouth 2 (two) times daily.   LOTEMAX 0.5 % Gel Generic drug:  Loteprednol Etabonate PLACE 1 DROP IN THE LEFT EYE TWICE DAILY   Nebivolol HCl 20 MG Tabs Commonly known as:  BYSTOLIC Take 1 tablet (20 mg total) by mouth daily. For Blood Pressure   nitroGLYCERIN 0.4 MG SL tablet Commonly known as:  NITROSTAT Place one under tongue for chest pain, up to 3 tablets   OLANZapine zydis 5 MG disintegrating tablet Commonly known as:  ZYPREXA Take 1 tablet (5 mg total) by mouth at bedtime.   polyethylene glycol powder powder Commonly known as:  GLYCOLAX/MIRALAX Take 17 g by mouth daily as needed for mild constipation.   timolol 0.5 % ophthalmic solution Commonly known as:  BETIMOL INSTILL 1 DROP INTO THE LEFT EYE DAILY      Allergies  Allergen Reactions  . Namzaric [Memantine Hcl-Donepezil Hcl] Nausea Only and Other (See Comments)    confusion  . Percocet [Oxycodone-Acetaminophen] Other (See Comments)    loosy goosy  . Sulfa Antibiotics Other (See Comments)    Tongue swells  . Adhesive [Tape] Other (See Comments)    Tears skin.  Please use "paper" tape  . Metformin And Related Diarrhea and Other (See Comments)    Abnormal Kidney Functions   . Tradjenta [Linagliptin] Other (See Comments)    Low back ache, resolved once medication stopped   . Penicillins Hives and Other (See Comments)    Tolerates Ceftriaxone, Has patient had a PCN reaction causing immediate rash, facial/tongue/throat swelling, SOB or lightheadedness with hypotension: Unknown Has patient had a PCN reaction causing severe rash involving mucus membranes or skin necrosis: No Has patient had a PCN reaction that required hospitalization No Has patient had a PCN reaction occurring within the last 10 years: No If  all of the above answers are "NO", then may proceed with Cephalosporin use.    Follow-up Information    Tyashia, Morrisette, DO Follow up.   Specialty:  Internal Medicine Contact information: [address] [place] [address] [phone]            The results of significant diagnostics from this hospitalization (including imaging, microbiology, ancillary and laboratory) are listed below for reference.    Significant Diagnostic Studies: Dg Chest Port 1 View  Result Date: 04/02/2017 CLINICAL DATA:  Acute hypoxic respiratory failure and pulmonary edema. EXAM: PORTABLE CHEST 1 VIEW COMPARISON:  One-view chest x-ray 04/01/2017. FINDINGS: Heart is enlarged. The patient has been extubated. Dense atherosclerotic calcifications are present. Mild  edema has increased slightly. Bilateral pleural effusions are present. Bibasilar airspace disease likely reflects atelectasis. IMPRESSION: 1. Interval extubation. 2. Cardiomegaly with increased interstitial edema and bilateral effusions compatible with congestive heart failure. 3. Bibasilar airspace disease likely reflects atelectasis. Infection is not excluded. 4.  Aortic Atherosclerosis (ICD10-I70.0). Electronically Signed   By: Marin Roberts M.D.   On: 04/02/2017 07:30   Dg Chest Port 1 View  Result Date: 04/01/2017 CLINICAL DATA:  Evaluate ETT. EXAM: PORTABLE CHEST 1 VIEW COMPARISON:  March 31, 2017 FINDINGS: The ETT terminates 3 cm above the carina. The NG tube terminates below today's film. No pneumothorax. Small effusions identified. Left retrocardiac opacity is stable. Mild pulmonary venous congestion with no overt edema. Stable cardiomediastinal silhouette. IMPRESSION: 1. The ETT is in good position. 2. Small bilateral pleural effusions and left retrocardiac opacity are stable. Mild pulmonary venous congestion without overt edema. Electronically Signed   By: Gerome Sam III M.D   On: 04/01/2017 07:47   Dg Chest Port 1  View  Result Date: 03/31/2017 CLINICAL DATA:  Endotracheal tube positioning EXAM: PORTABLE CHEST 1 VIEW COMPARISON:  03/31/2017 at 4:22 a.m. FINDINGS: Endotracheal tube is only 7 mm above the carina. Retraction of 2.5 cm is recommended. Nasogastric or orogastric tube extends down into the stomach. Dense atherosclerotic calcification of the thoracic aorta. Perihilar airspace opacities are slightly improved but there is left lower lobe consolidation. Blunted right lateral costophrenic angle. Bony demineralization. IMPRESSION: 1. The endotracheal tube is 7 mm above the carina, and retraction of 2.5 cm is recommended. 2. Slightly improved perihilar densities although some of this may be due to redistribution of pleural effusions. Left lower lobe airspace opacity potentially from atelectasis or pneumonia. Blunted right costophrenic angle compatible with right pleural fluid. 3.  Aortic Atherosclerosis (ICD10-I70.0). Electronically Signed   By: Gaylyn Rong M.D.   On: 03/31/2017 20:44   Dg Chest Portable 1 View  Result Date: 03/31/2017 CLINICAL DATA:  Intubation EXAM: PORTABLE CHEST 1 VIEW COMPARISON:  Chest radiograph 03/31/2017 at 2:18 a.m. FINDINGS: Endotracheal tube tip is 2 cm above the inferior margin of the carina. Retraction by 3 cm would put at the level of the clavicular heads. Orogastric tube side port is below the diaphragm. Unchanged cardiomegaly and calcific aortic atherosclerosis. Pulmonary edema has worsened. Small left pleural effusion. IMPRESSION: 1. Endotracheal tube tip is 2 cm above the carina and could be retracted by 3 cm for optimal positioning. 2. Worsening pulmonary edema. Unchanged cardiomegaly and calcific aortic atherosclerosis (ICD10-I70.0). Electronically Signed   By: Deatra Robinson M.D.   On: 03/31/2017 04:46   Dg Chest Portable 1 View  Result Date: 03/31/2017 CLINICAL DATA:  84 year old female with dyspnea and chest pain EXAM: PORTABLE CHEST 1 VIEW COMPARISON:  03/20/2017,  09/06/2016 FINDINGS: Borderline cardiomegaly with aortic atherosclerosis. Peribronchial thickening as well as central vascular congestion and interstitial edema is noted. Subpleural scarring is seen in the left mid lung. Probable tiny right effusion. IMPRESSION: Cardiomegaly with central vascular congestion and interstitial edema and aortic atherosclerosis. Probable trace right effusion. Electronically Signed   By: Tollie Eth M.D.   On: 03/31/2017 03:03   Dg Chest Port 1 View  Result Date: 03/20/2017 CLINICAL DATA:  Acute onset of shortness of breath and generalized chest pain. EXAM: PORTABLE CHEST 1 VIEW COMPARISON:  Chest radiograph performed 03/09/2017 FINDINGS: The lungs are well-aerated. Vascular congestion is noted. Bilateral central airspace opacities raise concern for pulmonary edema, with mild left midlung atelectasis. No significant pleural effusion or  pneumothorax is seen. The cardiomediastinal silhouette is borderline normal in size. No acute osseous abnormalities are seen. IMPRESSION: Vascular congestion. Bilateral central airspace opacities raise concern for pulmonary edema, with mild left midlung atelectasis. Electronically Signed   By: Roanna Raider M.D.   On: 03/20/2017 06:04   Dg Chest Port 1 View  Result Date: 03/09/2017 CLINICAL DATA:  Shortness of breath. History of congestive heart failure. EXAM: PORTABLE CHEST 1 VIEW COMPARISON:  Chest CT 02/05/2017 FINDINGS: Cardiomediastinal silhouette is enlarged. Calcific atherosclerotic disease of the aorta. There is no evidence of focal airspace consolidation or pneumothorax. Pulmonary edema with bilateral small to moderate pleural effusions. Osseous structures are without acute abnormality. Soft tissues are grossly normal. IMPRESSION: Findings of heart failure including enlarged cardiac silhouette, interstitial pulmonary edema and bilateral pleural effusions. Electronically Signed   By: Ted Mcalpine M.D.   On: 03/09/2017 12:58   Dg  Abd Portable 1v  Result Date: 03/31/2017 CLINICAL DATA:  Orogastric tube placement EXAM: PORTABLE ABDOMEN - 1 VIEW COMPARISON:  February 04, 2017 FINDINGS: Orogastric tube tip and side port are in the stomach. There is no bowel dilatation or air-fluid level to suggest bowel obstruction. No free air. There are stents in each common iliac artery. There is a probable small calcified leiomyoma in the mid-pelvis. There is atelectatic change in the left lung base. IMPRESSION: Orogastric tube tip and side port in stomach. No bowel obstruction or free air evident. Probable small uterine leiomyoma in the pelvis. Left base atelectasis. Electronically Signed   By: Bretta Bang III M.D.   On: 03/31/2017 07:46    Microbiology: Recent Results (from the past 240 hour(s))  Urine culture     Status: None   Collection Time: 03/31/17  5:20 AM  Result Value Ref Range Status   Specimen Description URINE, RANDOM  Final   Special Requests NONE  Final   Culture NO GROWTH  Final   Report Status 04/01/2017 FINAL  Final  Culture, blood (routine x 2)     Status: None   Collection Time: 03/31/17  7:37 AM  Result Value Ref Range Status   Specimen Description BLOOD RIGHT HAND  Final   Special Requests IN PEDIATRIC BOTTLE Blood Culture adequate volume  Final   Culture NO GROWTH 5 DAYS  Final   Report Status 04/05/2017 FINAL  Final  Culture, blood (routine x 2)     Status: None   Collection Time: 03/31/17  7:47 AM  Result Value Ref Range Status   Specimen Description BLOOD LEFT HAND  Final   Special Requests IN PEDIATRIC BOTTLE Blood Culture adequate volume  Final   Culture NO GROWTH 5 DAYS  Final   Report Status 04/05/2017 FINAL  Final  MRSA PCR Screening     Status: None   Collection Time: 03/31/17  8:18 AM  Result Value Ref Range Status   MRSA by PCR NEGATIVE NEGATIVE Final    Comment:        The GeneXpert MRSA Assay (FDA approved for NASAL specimens only), is one component of a comprehensive MRSA  colonization surveillance program. It is not intended to diagnose MRSA infection nor to guide or monitor treatment for MRSA infections.      Labs: Basic Metabolic Panel: Recent Labs  Lab 04/01/17 0450 04/02/17 0008 04/02/17 0413 04/03/17 1001 04/04/17 0258 04/06/17 0424  NA 140 142 143 139 139 141  K 4.0 3.9 3.9 3.9 4.2 4.6  CL 110 108 109 105 105 107  CO2 20*  21* 20* 19* 21* 22  GLUCOSE 138* 154* 87 209* 165* 152*  BUN 46* 52* 56* 65* 73* 83*  CREATININE 2.25* 2.32* 2.32* 2.84* 3.00* 2.94*  CALCIUM 8.1* 8.7* 8.8* 9.0 8.3* 8.2*  MG 2.2 2.4 2.4  --   --   --   PHOS 4.0  --  5.5*  --   --   --    Liver Function Tests: Recent Labs  Lab 04/01/17 0450 04/06/17 0424  AST 15 13*  ALT 8* 12*  ALKPHOS 46 46  BILITOT 0.6 0.7  PROT 5.2* 5.0*  ALBUMIN 2.7* 2.6*   No results for input(s): LIPASE, AMYLASE in the last 168 hours. No results for input(s): AMMONIA in the last 168 hours. CBC: Recent Labs  Lab 04/01/17 0450 04/02/17 0413 04/03/17 1001 04/04/17 0258  WBC 7.2 6.1 6.5 4.4  HGB 8.6* 8.8* 9.6* 7.8*  HCT 27.9* 29.1* 31.0* 24.5*  MCV 94.3 96.4 95.4 93.2  PLT 180 170 220 175   Cardiac Enzymes: Recent Labs  Lab 03/31/17 2135  TROPONINI 0.06*   BNP: BNP (last 3 results) Recent Labs    03/19/17 2304 03/31/17 0208 04/01/17 0450  BNP 1,636.5* 1,499.8* 783.3*    ProBNP (last 3 results) No results for input(s): PROBNP in the last 8760 hours.  CBG: Recent Labs  Lab 04/06/17 1206 04/06/17 1641 04/06/17 2108 04/07/17 0733 04/07/17 1204  GLUCAP 245* 190* 160* 165* 186*       Signed:  Darlin Droparole N Diedra Sinor, MD [facility] 04/07/2017, 3:47 PM

## 2017-04-07 NOTE — Progress Notes (Addendum)
[facility] Hospital Liaison:  RN visit  Notified by Eunice Blaseebbie, [facility], of [facility] request [facility] after discharge.  Chart and patient information reviewed by [facility] physician.  Hospice eligibility approved.    Writer spoke with Arna Mediciora, daughter, over the phone to initiate education related to hospice philosophy, services and team approach to care.  Family verbalized understanding of information given.  Per discussion, plan is for discharge to home by private vehicle on 04/07/17.  Patient scheduled for AV visit with Renae FicklePaul, RN between 6 - 8 pm today.   Please send signed and completed DNR form home with family.  Patient will need prescriptions for discharge comfort medications.   DME needs have been discussed, patient currently has the following equipment in the home:  Walker, cane, BSC, shower seat and O2 concentrator with portable tanks.  Family requests the following DME for delivery to the home:  Hospital bed 1/2 rails.  Daughter declines other equipment.  [facility] equipment manager has been notified and will contact [facility] to arrange delivery to the home.  Home address has been verified and is correct in the chart.  Arna Mediciora, daughter, is the family member to contact to arrange time of delivery at [phone].   [facility] Referral Center aware of the above.  Completed discharge summary will need to be faxed to [facility] at [phone], when final.  Please notify [facility] when patient is ready to leave the unit at discharge.  Call [phone] or [phone] after 5pm.  [facility] information and contact numbers given to Arna Mediciora during this visit.  Above information shared with Eunice BlaseDebbie, [facility].  Please call with any hospice related questions.  Thank you for this referral.  Adele BarthelAmy [facility], RN, BSN [facility] Hospital Liaison [phone]  All hospital liaisons are now on AMION.

## 2017-04-07 NOTE — Progress Notes (Signed)
Updated Dr hall [facility]. Spoke w RN Shawna OrleansMelanie and instructed to call patient's daughter when ready to DC.

## 2017-04-07 NOTE — Plan of Care (Signed)
  Progressing Clinical Measurements: Diagnostic test results will improve 04/07/2017 0312 - Progressing by [name], [name], RN REFUSED LABS THIS AM explained to importance, lab will revisit next routine time Safety: Ability to remain free from injury will improve 04/07/2017 0312 - Progressing by [name], Gini Caputo, RN Encouraged patient to use call bell prior to exiting bed and has family at bedside.

## 2017-04-10 ENCOUNTER — Telehealth: Payer: Self-pay | Admitting: Nurse Practitioner

## 2017-04-10 ENCOUNTER — Other Ambulatory Visit: Payer: Self-pay | Admitting: Internal Medicine

## 2017-04-10 ENCOUNTER — Telehealth: Payer: Self-pay

## 2017-04-10 DIAGNOSIS — K622 Anal prolapse: Secondary | ICD-10-CM

## 2017-04-10 NOTE — Telephone Encounter (Signed)
I have called Arna Mediciora again today. We were able to speak on the phone.   Very sad and very difficult situation. Tyler AasDoris has refused Hospice. Has continued to exhibit paranoid behavior. The sheriff has been there apparently twice since discharge.   This "friend" of Abbygail's - Annette StableBill is apparently back in the picture - sounds like he is trying to get control of her assets.   Arna Mediciora and Bonita QuinLinda are trying to get to an attorney to start the process for guardian ship/POA. No one is open today. Also trying to get her records from the hospital to show support for getting her declared incompetent.    They were able to freeze her bank accounts.   They will keep me posted.   Rosalio MacadamiaLori C. Nereida Schepp, RN, ANP-C [facility] [address] [place], [place]  [id number] [phone]

## 2017-04-10 NOTE — Telephone Encounter (Signed)
Transition Care Management Follow-Up Telephone Call   [facility]  How have you been since you were released from the hospital? Doing well, no problems  Any patient concerns? Pt would like to change her health care power of attorney ASAP  Items Reviewed:   Meds: Y  Allergies: Y  Dietary Changes Reviewed: Y  Functional Questionnaire:  Independent-I Dependent-D  ADLs:   Dressing- I    Eating- I   Maintaining continence- D   Transferring-I   Transportation- D   Meal Prep- I   Managing Meds- I w/ assist  Confirmed importance and Date/Time of follow-up visits scheduled: 04/19/2017 @ 9:15am D.r Montez Moritaarter   Confirmed with patient if condition worsens to call PCP or go to the Emergency Dept. Patient was given office number and encouraged to call back with questions or concerns: Yes

## 2017-04-12 ENCOUNTER — Other Ambulatory Visit: Payer: Self-pay | Admitting: Internal Medicine

## 2017-04-12 DIAGNOSIS — K622 Anal prolapse: Secondary | ICD-10-CM

## 2017-04-12 NOTE — Telephone Encounter (Signed)
ERROR

## 2017-04-13 NOTE — Telephone Encounter (Signed)
Dr.Montz please advise if ok to approve Lotemax Gel for eye, I am not sure if this medication is continually filled by primary care or an eye doctor

## 2017-04-17 ENCOUNTER — Telehealth: Payer: Self-pay

## 2017-04-17 ENCOUNTER — Ambulatory Visit (INDEPENDENT_AMBULATORY_CARE_PROVIDER_SITE_OTHER): Payer: Medicare Other | Admitting: Nurse Practitioner

## 2017-04-17 ENCOUNTER — Encounter: Payer: Self-pay | Admitting: Nurse Practitioner

## 2017-04-17 VITALS — BP 160/80 | HR 60 | Temp 97.4°F | Wt 120.0 lb

## 2017-04-17 DIAGNOSIS — N184 Chronic kidney disease, stage 4 (severe): Secondary | ICD-10-CM | POA: Diagnosis not present

## 2017-04-17 DIAGNOSIS — I872 Venous insufficiency (chronic) (peripheral): Secondary | ICD-10-CM

## 2017-04-17 DIAGNOSIS — I10 Essential (primary) hypertension: Secondary | ICD-10-CM

## 2017-04-17 DIAGNOSIS — I5032 Chronic diastolic (congestive) heart failure: Secondary | ICD-10-CM

## 2017-04-17 DIAGNOSIS — R079 Chest pain, unspecified: Secondary | ICD-10-CM | POA: Diagnosis not present

## 2017-04-17 DIAGNOSIS — R609 Edema, unspecified: Secondary | ICD-10-CM | POA: Diagnosis not present

## 2017-04-17 NOTE — Progress Notes (Signed)
Careteam: Patient Care Team: Kirt Boys, DO as PCP - General (Internal Medicine) Rosalio Macadamia, NP as Nurse Practitioner (Cardiology) Sherrie George, MD as Consulting Physician (Ophthalmology) Sherren Kerns, MD as Consulting Physician (Vascular Surgery) Bernette Redbird, MD as Consulting Physician (Gastroenterology) Tonny Bollman, MD as Consulting Physician (Cardiology) Karie Soda, MD as Consulting Physician (General Surgery)  Advanced Directive information    Allergies  Allergen Reactions  . Namzaric [Memantine Hcl-Donepezil Hcl] Nausea Only and Other (See Comments)    confusion  . Percocet [Oxycodone-Acetaminophen] Other (See Comments)    loosy goosy  . Sulfa Antibiotics Other (See Comments)    Tongue swells  . Adhesive [Tape] Other (See Comments)    Tears skin.  Please use "paper" tape  . Metformin And Related Diarrhea and Other (See Comments)    Abnormal Kidney Functions   . Tradjenta [Linagliptin] Other (See Comments)    Low back ache, resolved once medication stopped   . Penicillins Hives and Other (See Comments)    Tolerates Ceftriaxone, Has patient had a PCN reaction causing immediate rash, facial/tongue/throat swelling, SOB or lightheadedness with hypotension: Unknown Has patient had a PCN reaction causing severe rash involving mucus membranes or skin necrosis: No Has patient had a PCN reaction that required hospitalization No Has patient had a PCN reaction occurring within the last 10 years: No If all of the above answers are "NO", then may proceed with Cephalosporin use.     Chief Complaint  Patient presents with  . Transitions Of Care    03/31/2017 - 04/07/2017 (7 days)     HPI: Patient is a 84 y.o. female seen in the office today to follow up hospitalization.  Woke up with this morning with chest pain, took 2 nitroglycerin (per her daughter) which has improved symptoms. Reports she has not had any other problems with her heart (pain,  palpitations) Reports pain was over 2 hours ago and has not recurred.  Declines any problems with breathing or increase in edemae.  Reports she is now married. Husband does not come with her to appts to "Protect her home" reports her daughters just want her things and home.  Feels like her daughters just "want to put her down" Wants to stay alive as long as possible but then states she wants to go naturally and does not want CPR, when "God wants to take me it is his decision, not the doctor" Pt is very paranoid and talks mostly about her daughters wanting her things.  Declined hospice- when they came out to her house she would not see them.   Declines MMSE today.   Daughter reports she has NOT been taking her medications but she has started to give them to her last night.  States her delusions in the hospital were much worse than at home. Trying to help her but she refuses care and help.    Review of Systems:  Very limited due to dementia with paranoia  Review of Systems  Constitutional: Negative for chills and fever.  Respiratory: Negative for cough, sputum production and shortness of breath.   Cardiovascular: Positive for chest pain and leg swelling. Negative for palpitations and orthopnea.  Neurological: Negative for weakness.  Psychiatric/Behavioral: Positive for memory loss.       Paranoia     Past Medical History:  Diagnosis Date  . Anemia   . Anxiety disorder   . Carotid artery occlusion   . Carotid bruit   . Chronic kidney disease (CKD),  stage III (moderate) (HCC)   . Coronary artery disease    Remote PCI. Had BMS to RCA and DES stent to Diagonal in Jan. 2012   . Diastolic heart failure    EF 55 to 60% per cath in Jan 2012  . DVT (deep venous thrombosis) (HCC)   . Gout   . Hyperlipidemia   . Hypertension   . Ischemic cardiomyopathy    with prior EF of 35%  . NSTEMI (non-ST elevated myocardial infarction) [facility](HCC) Jan 2012   with PCI to RCA and DX per Dr. Excell Seltzerooper  .  Obesity   . Pneumonia 2016  . Proteinuria   . Stroke [facility](HCC) 2013   ?  mini    . TIA (transient ischemic attack)   . Type II diabetes mellitus (HCC)    long standing   Past Surgical History:  Procedure Laterality Date  . ABDOMINAL AORTAGRAM N/A 01/17/2014   Procedure: ABDOMINAL Ronny FlurryAORTAGRAM;  Surgeon: Sherren Kernsharles E Fields, MD;  [facility];  Service: Cardiovascular;  Laterality: N/A;  . ANKLE FUSION Bilateral   . APPENDECTOMY    . CATARACT EXTRACTION, BILATERAL Bilateral   . CORONARY ANGIOPLASTY WITH STENT PLACEMENT  04/05/2010   distal RCA  . FRACTURE SURGERY Right 2005   bilateral ankles   . TONSILLECTOMY     Social History:   reports that she has quit smoking. she has never used smokeless tobacco. She reports that she does not drink alcohol or use drugs.  Family History  Problem Relation Age of Onset  . Diabetes Daughter   . CAD Neg Hx     Medications: Patient's Medications  New Prescriptions   No medications on file  Previous Medications   ASPIRIN EC 81 MG TABLET    Take 1 tablet (81 mg total) by mouth daily.   CLONIDINE (CATAPRES) 0.1 MG TABLET    Take 1 tablet (0.1 mg total) by mouth 2 (two) times daily.   DOCUSATE SODIUM (COLACE) 100 MG CAPSULE    Take 1 capsule (100 mg total) by mouth every 12 (twelve) hours.   EZETIMIBE-SIMVASTATIN (VYTORIN) 10-20 MG TABLET    TAKE 1/2 TABLET BY MOUTH ONCE NIGHTLY TO CONTROL CHOLESTEROL   FEEDING SUPPLEMENT, ENSURE ENLIVE, (ENSURE ENLIVE) LIQD    Take 237 mLs by mouth 2 (two) times daily between meals.   FUROSEMIDE (LASIX) 20 MG TABLET    TAKE ONE TABLET BY MOUTH TWICE DAILY FOR HYPERTENSION   HYDROCORTISONE (PROCTOZONE-HC) 2.5 % RECTAL CREAM    APPLY TO RECTUM THREE TIMES A DAY FOR DISCOMFORT   ISOSORBIDE-HYDRALAZINE (BIDIL) 20-37.5 MG TABLET    Take 1 tablet by mouth 2 (two) times daily.   LOTEMAX 0.5 % GEL    PLACE 1 DROP IN THE LEFT EYE TWICE DAILY   NEBIVOLOL HCL (BYSTOLIC) 20 MG TABS    Take 1 tablet (20 mg total) by mouth  daily. For Blood Pressure   NITROGLYCERIN (NITROSTAT) 0.4 MG SL TABLET    Place one under tongue for chest pain, up to 3 tablets   OLANZAPINE ZYDIS (ZYPREXA) 5 MG DISINTEGRATING TABLET    Take 1 tablet (5 mg total) by mouth at bedtime.   POLYETHYLENE GLYCOL POWDER (GLYCOLAX/MIRALAX) POWDER    Take 17 g by mouth daily as needed for mild constipation.    TIMOLOL (BETIMOL) 0.5 % OPHTHALMIC SOLUTION    INSTILL 1 DROP INTO THE LEFT EYE DAILY  Modified Medications   No medications on file  Discontinued Medications   No  medications on file     Physical Exam:  Vitals:   04/17/17 1330  BP: (!) 160/80  Pulse: 60  Temp: (!) 97.4 F (36.3 C)  TempSrc: Oral  SpO2: 97%  Weight: 120 lb (54.4 kg)   Body mass index is 23.44 kg/m.  Physical Exam  Constitutional: She appears well-developed.  Frail appearing in NAD  HENT:  Mouth/Throat: Oropharynx is clear and moist.  MMM; no oral thrush  Eyes: Pupils are equal, round, and reactive to light.  Cardiovascular: Normal rate. An irregular rhythm present. Frequent extrasystoles are present.  Murmur heard. Pulmonary/Chest: Effort normal. No stridor. No respiratory distress. She has no wheezes. She has rales.  Abdominal: Soft. Normal appearance and bowel sounds are normal. There is no hepatomegaly. There is no rigidity.  Musculoskeletal: She exhibits edema. She exhibits no tenderness.  1+ bilaterally   Neurological: She is alert.  Skin: Skin is warm and dry.  Psychiatric: She has a normal mood and affect. Her behavior is normal. Thought content is delusional.    Labs reviewed: Basic Metabolic Panel: Recent Labs    09/08/16 1523  02/05/17 0155  03/20/17 1410  04/01/17 0450 04/02/17 0008 04/02/17 0413 04/03/17 1001 04/04/17 0258 04/06/17 0424  NA  --    < >  --    < >  --    < > 140 142 143 139 139 141  K  --    < >  --    < >  --    < > 4.0 3.9 3.9 3.9 4.2 4.6  CL  --    < >  --    < >  --    < > 110 108 109 105 105 107  CO2  --    <  >  --    < >  --    < > 20* 21* 20* 19* 21* 22  GLUCOSE  --    < >  --    < >  --    < > 138* 154* 87 209* 165* 152*  BUN  --    < >  --    < >  --    < > 46* 52* 56* 65* 73* 83*  CREATININE  --    < >  --    < > 1.96*   < > 2.25* 2.32* 2.32* 2.84* 3.00* 2.94*  CALCIUM  --    < >  --    < >  --    < > 8.1* 8.7* 8.8* 9.0 8.3* 8.2*  MG  --    < >  --    < > 2.3  --  2.2 2.4 2.4  --   --   --   PHOS  --   --   --   --   --   --  4.0  --  5.5*  --   --   --   TSH 0.78  --  2.485  --  3.950  --   --   --   --   --   --   --    < > = values in this interval not displayed.   Liver Function Tests: Recent Labs    03/19/17 2304 04/01/17 0450 04/06/17 0424  AST 17 15 13*  ALT 10* 8* 12*  ALKPHOS 52 46 46  BILITOT 0.4 0.6 0.7  PROT 6.1* 5.2* 5.0*  ALBUMIN 3.2* 2.7* 2.6*   Recent Labs  04/29/16 1407 10/01/16 1523 11/26/16 1725  LIPASE 33 21 18   No results for input(s): AMMONIA in the last 8760 hours. CBC: Recent Labs    02/05/17 0749  03/09/17 1100  03/19/17 2304  04/02/17 0413 04/03/17 1001 04/04/17 0258  WBC 7.6   < > 8.1   < > 4.1   < > 6.1 6.5 4.4  NEUTROABS 5.8  --  7.0  --  2.8  --   --   --   --   HGB 10.8*   < > 9.8*   < > 9.2*   < > 8.8* 9.6* 7.8*  HCT 33.6*   < > 31.5*   < > 28.7*   < > 29.1* 31.0* 24.5*  MCV 93.1   < > 93.5   < > 94.7   < > 96.4 95.4 93.2  PLT 166   < > 230   < > 256   < > 170 220 175   < > = values in this interval not displayed.   Lipid Panel: Recent Labs    04/20/16 1351 08/29/16 1051 03/31/17 0741  CHOL 148 130  --   HDL 42* 39*  --   LDLCALC 71 52  --   TRIG 173* 196* 819*  CHOLHDL 3.5 3.3  --    TSH: Recent Labs    09/08/16 1523 02/05/17 0155 03/20/17 1410  TSH 0.78 2.485 3.950   A1C: Lab Results  Component Value Date   HGBA1C 6.5 (H) 03/20/2017     Assessment/Plan 1. CKD (chronic kidney disease) stage 4, GFR 15-29 ml/min (HCC) -will follow up lab work at this time. - COMPLETE METABOLIC PANEL WITH GFR  2. Chest  pain, unspecified type -relieved with nitro, no current chest pains.  - CBC with Differential/Platelets - EKG 12-Lead- consistent with previous, SR with PVCs and left bundle branch block. Rate 58  3. Edema of both lower legs due to peripheral venous insufficiency Stable, not significant increase in this, encouraged compliance with lasix, elevation and low sodium diet  4. Chronic diastolic heart failure, NYHA class 1 (HCC) -multiple hospitalization due to CHF exacerbation, does not take her medication correctly and refuses to let her daughters help her.  Hospice and palliative care have been consulted but she refused to see them at home.  5. Hypertension -elevated today, noncompliant with medication, she reports she is taking her medication but then daughters states that she is not. Daughter plans to help her with medication at this time to make sure she is taking regularly.     Next appt: to follow up in 6 weeks with Dr Corie Chiquito K. Biagio Borg  [facility] [phone] 8 am - 5 pm) [phone] (after hours)

## 2017-04-17 NOTE — Patient Instructions (Addendum)
Make sure to take medication as prescribed.   To follow up with Dr Montez Moritaarter in 6 weeks for CHF

## 2017-04-17 NOTE — Telephone Encounter (Signed)
I called patient's daughter, Arna Mediciora, so that Shanda BumpsJessica could speak with her about patient's care. There was no answer but I left a message asking that Arna Mediciora call the office.

## 2017-04-18 ENCOUNTER — Other Ambulatory Visit: Payer: Self-pay

## 2017-04-18 ENCOUNTER — Ambulatory Visit (HOSPITAL_COMMUNITY)
Admission: RE | Admit: 2017-04-18 | Discharge: 2017-04-18 | Disposition: A | Discharge status: Home / Self Care | Payer: Medicare Other | Source: Ambulatory Visit | Attending: Vascular Surgery | Admitting: Vascular Surgery

## 2017-04-18 ENCOUNTER — Encounter: Payer: Self-pay | Admitting: Internal Medicine

## 2017-04-18 ENCOUNTER — Ambulatory Visit (INDEPENDENT_AMBULATORY_CARE_PROVIDER_SITE_OTHER): Payer: Medicare Other | Admitting: Family

## 2017-04-18 ENCOUNTER — Encounter: Payer: Self-pay | Admitting: Family

## 2017-04-18 VITALS — BP 153/61 | HR 51 | Temp 97.1°F | Resp 14 | Ht 60.0 in | Wt 112.0 lb

## 2017-04-18 DIAGNOSIS — I872 Venous insufficiency (chronic) (peripheral): Secondary | ICD-10-CM | POA: Diagnosis not present

## 2017-04-18 DIAGNOSIS — I739 Peripheral vascular disease, unspecified: Secondary | ICD-10-CM | POA: Diagnosis not present

## 2017-04-18 DIAGNOSIS — I779 Disorder of arteries and arterioles, unspecified: Secondary | ICD-10-CM

## 2017-04-18 LAB — CBC WITH DIFFERENTIAL/PLATELET
BASOS PCT: 0.2 %
Basophils Absolute: 10 cells/uL (ref 0–200)
EOS PCT: 2.3 %
Eosinophils Absolute: 117 cells/uL (ref 15–500)
HCT: 29.1 % — ABNORMAL LOW (ref 35.0–45.0)
HEMOGLOBIN: 9.3 g/dL — AB (ref 11.7–15.5)
Lymphs Abs: 1076 cells/uL (ref 850–3900)
MCH: 29.9 pg (ref 27.0–33.0)
MCHC: 32 g/dL (ref 32.0–36.0)
MCV: 93.6 fL (ref 80.0–100.0)
MONOS PCT: 8.8 %
MPV: 11.1 fL (ref 7.5–12.5)
NEUTROS ABS: 3448 {cells}/uL (ref 1500–7800)
Neutrophils Relative %: 67.6 %
Platelets: 254 10*3/uL (ref 140–400)
RBC: 3.11 10*6/uL — AB (ref 3.80–5.10)
RDW: 13.7 % (ref 11.0–15.0)
Total Lymphocyte: 21.1 %
WBC mixed population: 449 cells/uL (ref 200–950)
WBC: 5.1 10*3/uL (ref 3.8–10.8)

## 2017-04-18 LAB — COMPLETE METABOLIC PANEL WITH GFR
AG Ratio: 1.4 (calc) (ref 1.0–2.5)
ALKALINE PHOSPHATASE (APISO): 56 U/L (ref 33–130)
ALT: 8 U/L (ref 6–29)
AST: 15 U/L (ref 10–35)
Albumin: 3.4 g/dL — ABNORMAL LOW (ref 3.6–5.1)
BILIRUBIN TOTAL: 0.3 mg/dL (ref 0.2–1.2)
BUN/Creatinine Ratio: 27 (calc) — ABNORMAL HIGH (ref 6–22)
BUN: 54 mg/dL — AB (ref 7–25)
CHLORIDE: 114 mmol/L — AB (ref 98–110)
CO2: 20 mmol/L (ref 20–32)
CREATININE: 2.02 mg/dL — AB (ref 0.60–0.88)
Calcium: 8.6 mg/dL (ref 8.6–10.4)
GFR, Est African American: 25 mL/min/{1.73_m2} — ABNORMAL LOW (ref >=60)
GFR, Est Non African American: 22 mL/min/{1.73_m2} — ABNORMAL LOW (ref >=60)
GLUCOSE: 110 mg/dL — AB (ref 65–99)
Globulin: 2.4 g/dL (calc) (ref 1.9–3.7)
Potassium: 5.4 mmol/L — ABNORMAL HIGH (ref 3.5–5.3)
Sodium: 144 mmol/L (ref 135–146)
TOTAL PROTEIN: 5.8 g/dL — AB (ref 6.1–8.1)

## 2017-04-18 NOTE — Telephone Encounter (Signed)
Late entry: Arna Mediciora return Jessica's phone call about 4:50 pm yesterday.  Shanda BumpsJessica was verbally informed

## 2017-04-18 NOTE — Telephone Encounter (Signed)
POA; Arna Mediciora was called and left voicemail for her to return call about letter concerning patient.  Advise to mail, fax; or pick-up

## 2017-04-18 NOTE — Progress Notes (Signed)
MMSE - Mini Mental State Exam 04/17/2017 09/08/2016 04/20/2016  Not completed: Refused - (No Data)  Orientation to time - 2 -  Orientation to Place - 3 -  Registration - 3 -  Attention/ Calculation - 0 -  Recall - 0 -  Language- name 2 objects - 2 -  Language- repeat - 1 -  Language- follow 3 step command - 2 -  Language- read & follow direction - 1 -  Write a sentence - 1 -  Copy design - 0 -  Total score - 15 -

## 2017-04-18 NOTE — Patient Instructions (Signed)
To decrease swelling in your feet and legs: Elevate feet above slightly bent knees, feet above heart, overnight and 3-4 times per day for 20 minutes.    Edema Edema is when you have too much fluid in your body or under your skin. Edema may make your legs, feet, and ankles swell up. Swelling is also common in looser tissues, like around your eyes. This is a common condition. It gets more common as you get older. There are many possible causes of edema. Eating too much salt (sodium) and being on your feet or sitting for a long time can cause edema in your legs, feet, and ankles. Hot weather may make edema worse. Edema is usually painless. Your skin may look swollen or shiny. Follow these instructions at home:  Keep the swollen body part raised (elevated) above the level of your heart when you are sitting or lying down.  Do not sit still or stand for a long time.  Do not wear tight clothes. Do not wear garters on your upper legs.  Exercise your legs. This can help the swelling go down.  Wear elastic bandages or support stockings as told by your doctor.  Eat a low-salt (low-sodium) diet to reduce fluid as told by your doctor.  Depending on the cause of your swelling, you may need to limit how much fluid you drink (fluid restriction).  Take over-the-counter and prescription medicines only as told by your doctor. Contact a doctor if:  Treatment is not working.  You have heart, liver, or kidney disease and have symptoms of edema.  You have sudden and unexplained weight gain. Get help right away if:  You have shortness of breath or chest pain.  You cannot breathe when you lie down.  You have pain, redness, or warmth in the swollen areas.  You have heart, liver, or kidney disease and get edema all of a sudden.  You have a fever and your symptoms get worse all of a sudden. Summary  Edema is when you have too much fluid in your body or under your skin.  Edema may make your legs,  feet, and ankles swell up. Swelling is also common in looser tissues, like around your eyes.  Raise (elevate) the swollen body part above the level of your heart when you are sitting or lying down.  Follow your doctor's instructions about diet and how much fluid you can drink (fluid restriction). This information is not intended to replace advice given to you by your health care provider. Make sure you discuss any questions you have with your health care provider. Document Released: 08/24/2007 Document Revised: 03/25/2016 Document Reviewed: 03/25/2016 Elsevier Interactive Patient Education  2017 [employer]     Peripheral Vascular Disease Peripheral vascular disease (PVD) is a disease of the blood vessels that are not part of your heart and brain. A simple term for PVD is poor circulation. In most cases, PVD narrows the blood vessels that carry blood from your heart to the rest of your body. This can result in a decreased supply of blood to your arms, legs, and internal organs, like your stomach or kidneys. However, it most often affects a person's lower legs and feet. There are two types of PVD.  Organic PVD. This is the more common type. It is caused by damage to the structure of blood vessels.  Functional PVD. This is caused by conditions that make blood vessels contract and tighten (spasm).  Without treatment, PVD tends to get worse over   time. PVD can also lead to acute ischemic limb. This is when an arm or limb suddenly has trouble getting enough blood. This is a medical emergency. Follow these instructions at home:  Take medicines only as told by your doctor.  Do not use any tobacco products, including cigarettes, chewing tobacco, or electronic cigarettes. If you need help quitting, ask your doctor.  Lose weight if you are overweight, and maintain a healthy weight as told by your doctor.  Eat a diet that is low in fat and cholesterol. If you need help, ask your  doctor.  Exercise regularly. Ask your doctor for some good activities for you.  Take good care of your feet. ? Wear comfortable shoes that fit well. ? Check your feet often for any cuts or sores. Contact a doctor if:  You have cramps in your legs while walking.  You have leg pain when you are at rest.  You have coldness in a leg or foot.  Your skin changes.  You are unable to get or have an erection (erectile dysfunction).  You have cuts or sores on your feet that are not healing. Get help right away if:  Your arm or leg turns cold and blue.  Your arms or legs become red, warm, swollen, painful, or numb.  You have chest pain or trouble breathing.  You suddenly have weakness in your face, arm, or leg.  You become very confused or you cannot speak.  You suddenly have a very bad headache.  You suddenly cannot see. This information is not intended to replace advice given to you by your health care provider. Make sure you discuss any questions you have with your health care provider. Document Released: 06/01/2009 Document Revised: 08/13/2015 Document Reviewed: 08/15/2013 Elsevier Interactive Patient Education  2017 [employer].  

## 2017-04-18 NOTE — Progress Notes (Signed)
CC: Follow up Chronic Venous Insufficiency and Peripheral Artery Occlusive Disease  History of Present Illness  [name] is a 84 y.o. (1927/06/08) female who was undergoing Unna boot therapy for a stasis ulcer on her right medial malleolus; this has healed. She has no new ulcers in her legs, but pt states she has intermittent pain at right medial malleolus, she denies pain elsewhere, denies neuropathy sx's.  She has had multiple chronic exacerbations and remissions of this ulcer over the years.    She is s/p bilateral common iliac stenting in October 2015. At the time of her arteriogram she had bilateral moderate superficial femoral artery stenoses. 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   Dr. Darrick PennaFields last evaluated pt on 02-04-16. At that time she had 2+ femoral pulses bilaterally, thickened toenails bilaterally, 2+ left  DP pulse, absent pedal pulses right foot no ulcers Right side ABI was 0.6, left 0.7  Stable peripheral arterial disease with no current wounds. Dr. Darrick PennaFields did not believe her foot pain was related to arterial or venous disease. Patient was to follow-up in one year with repeat ABIs with our nurse practitioner.  She denies chest pain or shortness of breath.  She walks daily but states she is a little unstable.   The patient's treatment regimen currently includes: maximal medical management.  She states she was married 2 weeks ago.   Pt Diabetic: Yes,  review of records indicates most recent A1C is 6.5 on 03-20-17 Pt smoker: former smoker, quit 1970   Pt meds include:  Statin :Yes  Betablocker: Yes  ASA: Yes  Other anticoagulants/antiplatelets: no    Past Medical History:  Diagnosis Date  . Anemia   . Anxiety disorder   . Carotid artery occlusion   . Carotid bruit   . Chronic kidney disease (CKD), stage III (moderate) (HCC)   . Coronary artery disease    Remote PCI. Had BMS to RCA and DES stent to Diagonal in Jan. 2012   .  Diastolic heart failure    EF 55 to 60% per cath in Jan 2012  . DVT (deep venous thrombosis) (HCC)   . Gout   . Hyperlipidemia   . Hypertension   . Ischemic cardiomyopathy    with prior EF of 35%  . NSTEMI (non-ST elevated myocardial infarction) [facility](HCC) Jan 2012   with PCI to RCA and DX per Dr. Excell Seltzerooper  . Obesity   . Pneumonia 2016  . Proteinuria   . Stroke [facility](HCC) 2013   ?  mini    . TIA (transient ischemic attack)   . Type II diabetes mellitus (HCC)    long standing    Social History Social History   Tobacco Use  . Smoking status: Former Games developermoker  . Smokeless tobacco: Never Used  . Tobacco comment: "smoked in my teens; quit after 1 year or so"  Substance Use Topics  . Alcohol use: No    Alcohol/week: 0.0 oz  . Drug use: No    Family History Family History  Problem Relation Age of Onset  . Diabetes Daughter   . CAD Neg Hx     Surgical History Past Surgical History:  Procedure Laterality Date  . ABDOMINAL AORTAGRAM N/A 01/17/2014   Procedure: ABDOMINAL Ronny FlurryAORTAGRAM;  Surgeon: Sherren Kernsharles E Fields, MD;  [facility];  Service: Cardiovascular;  Laterality: N/A;  . ANKLE FUSION Bilateral   . APPENDECTOMY    . CATARACT EXTRACTION, BILATERAL Bilateral   .  CORONARY ANGIOPLASTY WITH STENT PLACEMENT  04/05/2010   distal RCA  . FRACTURE SURGERY Right 2005   bilateral ankles   . TONSILLECTOMY      Allergies  Allergen Reactions  . Namzaric [Memantine Hcl-Donepezil Hcl] Nausea Only and Other (See Comments)    confusion  . Percocet [Oxycodone-Acetaminophen] Other (See Comments)    loosy goosy  . Sulfa Antibiotics Other (See Comments)    Tongue swells  . Adhesive [Tape] Other (See Comments)    Tears skin.  Please use "paper" tape  . Metformin And Related Diarrhea and Other (See Comments)    Abnormal Kidney Functions   . Tradjenta [Linagliptin] Other (See Comments)    Low back ache, resolved once medication stopped   . Penicillins Hives and Other (See Comments)     Tolerates Ceftriaxone, Has patient had a PCN reaction causing immediate rash, facial/tongue/throat swelling, SOB or lightheadedness with hypotension: Unknown Has patient had a PCN reaction causing severe rash involving mucus membranes or skin necrosis: No Has patient had a PCN reaction that required hospitalization No Has patient had a PCN reaction occurring within the last 10 years: No If all of the above answers are "NO", then may proceed with Cephalosporin use.     Current Outpatient Medications  Medication Sig Dispense Refill  . aspirin EC 81 MG tablet Take 1 tablet (81 mg total) by mouth daily. 30 tablet 0  . cloNIDine (CATAPRES) 0.1 MG tablet Take 1 tablet (0.1 mg total) by mouth 2 (two) times daily. 60 tablet 11  . docusate sodium (COLACE) 100 MG capsule Take 1 capsule (100 mg total) by mouth every 12 (twelve) hours. 60 capsule 0  . ezetimibe-simvastatin (VYTORIN) 10-20 MG tablet TAKE 1/2 TABLET BY MOUTH ONCE NIGHTLY TO CONTROL CHOLESTEROL 15 tablet 5  . feeding supplement, ENSURE ENLIVE, (ENSURE ENLIVE) LIQD Take 237 mLs by mouth 2 (two) times daily between meals. 237 mL 12  . furosemide (LASIX) 20 MG tablet TAKE ONE TABLET BY MOUTH TWICE DAILY FOR HYPERTENSION 60 tablet 0  . hydrocortisone (PROCTOZONE-HC) 2.5 % rectal cream APPLY TO RECTUM THREE TIMES A DAY FOR DISCOMFORT 30 g 0  . isosorbide-hydrALAZINE (BIDIL) 20-37.5 MG tablet Take 1 tablet by mouth 2 (two) times daily. 60 tablet 0  . LOTEMAX 0.5 % GEL PLACE 1 DROP IN THE LEFT EYE TWICE DAILY 5 g 0  . Nebivolol HCl (BYSTOLIC) 20 MG TABS Take 1 tablet (20 mg total) by mouth daily. For Blood Pressure 30 tablet 2  . nitroGLYCERIN (NITROSTAT) 0.4 MG SL tablet Place one under tongue for chest pain, up to 3 tablets 25 tablet 3  . OLANZapine zydis (ZYPREXA) 5 MG disintegrating tablet Take 1 tablet (5 mg total) by mouth at bedtime. 15 tablet 0  . polyethylene glycol powder (GLYCOLAX/MIRALAX) powder Take 17 g by mouth daily as needed for  mild constipation.     . timolol (BETIMOL) 0.5 % ophthalmic solution INSTILL 1 DROP INTO THE LEFT EYE DAILY 10 mL 0   No current facility-administered medications for this visit.     On ROS today: See HPI for pertinent positives and negatives.  Physical Examination  Vitals:   04/18/17 1206 04/18/17 1213  BP: (!) 143/58 (!) 153/61  Pulse: (!) 51 (!) 51  Resp: 14   Temp: (!) 97.1 F (36.2 C)   TempSrc: Oral   SpO2: 96%   Weight: 112 lb (50.8 kg)   Height: 5' (1.524 m)    Body mass index is 21.87 kg/m.  General: A&O x 2, elderly frail female seated in wheelchair HEENT: Grossly intact and WNL. PERRLA Pulmonary: Sym exp,respirations are non labored,  fair air movement in all fields, CTAB, no rales, rhonchi, or wheezing. Cardiac: Regular rhythm, bradycardic at 50/min (on a beta blocker), no detected murmur.  Vascular: Vessel Right Left  Radial 1+Palpable 1+Palpable  Carotid  without bruit  without bruit  Aorta Not palpable N/A  Femoral Palpable Palpable  Popliteal Not palpable Not palpable  PT Not Palpable Not Palpable  DP Not Palpable Not Palpable   Gastrointestinal: soft, NTND, -G/R, - HSM, - palpable masses, - CVAT B. Musculoskeletal: M/S 4/5 throughout, Extremities without ischemic changes. Significant cervical and thoracic kyphosis.  2+ pitting bilateral pretibial/ankle/feet edema. Thickened nails of both feet.  Skin: No rash, no cellulitis, no ulcers noted.  Neurologic: Pain and light touch intact in extremities, Motor exam as listed above. CN 2-12 intact except is hard of hearing.  Psychiatric: Thought content seems to consist mostly of concerns re her daughters, mood appropriate to clinical situation.     DATA  ABI (Date: 04/18/2017):  R:   ABI: 0.47 (was 0.61 on 04-12-16),   PT: waveform morphology not documented (was mono)  DP: waveform morphology not documented (was mono)  TBI:  0.34 (was 0.37)  L:   ABI: 0.67 (was 0.77),   PT: waveform morphology  not documented (was bi)  DP: waveform morphology not documented (was mono)  TBI: 0.42 (was 0.50)  Decline in bilateral ABI, moderate disease.    Medical Decision Making  CARINNA NEWHART is a 84 y.o. female who has a healed stasis ulcer on her right medial malleolus. She has intermittent pain at right medial malleolus, she denies pain elsewhere, denies neuropathy sx's.  She has had multiple chronic exacerbations and remissions of this ulcer over the years.    She walks at home, she may be unstable on her feet. It seems she does not walk enough to elicit claudication sx's.   She is s/p bilateral common iliac stenting in October 2015. At the time of her arteriogram she had bilateral moderate superficial femoral artery stenoses. 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   Continue daily seated leg exercises as discussed and demonstrated.  In regards to the dependent edema in her lower legs: I instructed her how to elevate her feet above her heart overnight and for 20 minutes 3-4x/day, see Patient Instructions. I gave her a pictorial brochure how to elevate feet and legs.    Based on the patient's vascular studies and examination, I have offered the patient: return in a year with ABI's.  Continue daily leg exercises.   Thank you for allowing [place] to participate in this patient's care.  Charisse March, RN, MSN, FNP-C [facility] Office: [phone]  Clinic MD: Early  04/18/2017, 3:42 PM

## 2017-04-18 NOTE — Telephone Encounter (Signed)
Laure KidneyNora Elkins, POA called and left message on Clinical intake wanting Ashley BumpsJessica to return her call regarding patient's appointment yesterday.  Please Call #[phone]

## 2017-04-18 NOTE — Telephone Encounter (Signed)
Spoke with daughter [name](nora) and she would like Dr Montez Moritaarter to write a letter to state that Ms [name] is unable to make her own decision and handle her finances and social security benefits due to her dementia and her mental capacity.  Apparently [name] called police on her daughter when she was trying to help her. She is having increase delusion and paranoia.

## 2017-04-19 ENCOUNTER — Ambulatory Visit: Payer: Self-pay | Admitting: Internal Medicine

## 2017-04-21 NOTE — Telephone Encounter (Signed)
Opened in error

## 2017-04-25 ENCOUNTER — Other Ambulatory Visit: Payer: Self-pay

## 2017-04-25 DIAGNOSIS — E875 Hyperkalemia: Secondary | ICD-10-CM

## 2017-05-02 ENCOUNTER — Other Ambulatory Visit: Payer: Medicare Other

## 2017-05-02 DIAGNOSIS — E875 Hyperkalemia: Secondary | ICD-10-CM

## 2017-05-02 LAB — BASIC METABOLIC PANEL
BUN/Creatinine Ratio: 22 (calc) (ref 6–22)
BUN: 42 mg/dL — ABNORMAL HIGH (ref 7–25)
CALCIUM: 8.8 mg/dL (ref 8.6–10.4)
CO2: 25 mmol/L (ref 20–32)
Chloride: 106 mmol/L (ref 98–110)
Creat: 1.94 mg/dL — ABNORMAL HIGH (ref 0.60–0.88)
GLUCOSE: 158 mg/dL — AB (ref 65–139)
Potassium: 4.9 mmol/L (ref 3.5–5.3)
Sodium: 141 mmol/L (ref 135–146)

## 2017-05-03 ENCOUNTER — Encounter (HOSPITAL_COMMUNITY): Payer: Self-pay | Admitting: Emergency Medicine

## 2017-05-03 ENCOUNTER — Other Ambulatory Visit: Payer: Self-pay | Admitting: Internal Medicine

## 2017-05-03 ENCOUNTER — Emergency Department (HOSPITAL_COMMUNITY)
Admission: EM | Admit: 2017-05-03 | Discharge: 2017-05-03 | Disposition: A | Discharge status: Home / Self Care | Payer: Medicare Other | Attending: Emergency Medicine | Admitting: Emergency Medicine

## 2017-05-03 ENCOUNTER — Telehealth: Payer: Self-pay

## 2017-05-03 ENCOUNTER — Other Ambulatory Visit: Payer: Self-pay

## 2017-05-03 DIAGNOSIS — K622 Anal prolapse: Secondary | ICD-10-CM

## 2017-05-03 DIAGNOSIS — F0390 Unspecified dementia without behavioral disturbance: Secondary | ICD-10-CM | POA: Insufficient documentation

## 2017-05-03 DIAGNOSIS — Z87891 Personal history of nicotine dependence: Secondary | ICD-10-CM | POA: Diagnosis not present

## 2017-05-03 DIAGNOSIS — N390 Urinary tract infection, site not specified: Secondary | ICD-10-CM | POA: Diagnosis not present

## 2017-05-03 DIAGNOSIS — Z79899 Other long term (current) drug therapy: Secondary | ICD-10-CM | POA: Diagnosis not present

## 2017-05-03 DIAGNOSIS — I2510 Atherosclerotic heart disease of native coronary artery without angina pectoris: Secondary | ICD-10-CM | POA: Diagnosis not present

## 2017-05-03 DIAGNOSIS — N183 Chronic kidney disease, stage 3 (moderate): Secondary | ICD-10-CM | POA: Insufficient documentation

## 2017-05-03 DIAGNOSIS — I252 Old myocardial infarction: Secondary | ICD-10-CM | POA: Insufficient documentation

## 2017-05-03 DIAGNOSIS — I129 Hypertensive chronic kidney disease with stage 1 through stage 4 chronic kidney disease, or unspecified chronic kidney disease: Secondary | ICD-10-CM | POA: Insufficient documentation

## 2017-05-03 DIAGNOSIS — R338 Other retention of urine: Secondary | ICD-10-CM

## 2017-05-03 DIAGNOSIS — R339 Retention of urine, unspecified: Secondary | ICD-10-CM | POA: Diagnosis not present

## 2017-05-03 DIAGNOSIS — E1122 Type 2 diabetes mellitus with diabetic chronic kidney disease: Secondary | ICD-10-CM | POA: Insufficient documentation

## 2017-05-03 DIAGNOSIS — R1030 Lower abdominal pain, unspecified: Secondary | ICD-10-CM | POA: Diagnosis present

## 2017-05-03 LAB — URINALYSIS, ROUTINE W REFLEX MICROSCOPIC
BILIRUBIN URINE: NEGATIVE
Glucose, UA: NEGATIVE mg/dL
Ketones, ur: NEGATIVE mg/dL
Nitrite: NEGATIVE
PH: 6 (ref 5.0–8.0)
Protein, ur: 100 mg/dL — AB
SPECIFIC GRAVITY, URINE: 1.011 (ref 1.005–1.030)

## 2017-05-03 MED ORDER — CEPHALEXIN 500 MG PO CAPS
500.0000 mg | ORAL_CAPSULE | Freq: Once | ORAL | Status: AC
  Administered 2017-05-03: 500 mg via ORAL
  Filled 2017-05-03: qty 1

## 2017-05-03 MED ORDER — CEPHALEXIN 500 MG PO CAPS: 500.0000 mg | ORAL_CAPSULE | Freq: Four times a day (QID) | ORAL | 0 refills | Status: DC

## 2017-05-03 NOTE — Telephone Encounter (Signed)
Please advise if this is considered a maintenance medication and ok if additional refills to be provided

## 2017-05-03 NOTE — ED Triage Notes (Signed)
[facility]. Family called EMS out due to chronic constipation and "acting herself today". Patient has dementia and EMS states patient able to stand and converse with EMS. EMS states initial O2 sat 85%-patient placed on 4l/[place]. Patient is comfortable on stretcher and in no acute distress.

## 2017-05-03 NOTE — ED Triage Notes (Signed)
Pt comes to ed, via ems, pt c/o of constipation chronic of last weeks. Pt feels de stressed.  Pt not acting right verbalized by family.. Pt alert x3 ( history of dementia however.  No sure what year it. V/s 112 /88 pulse 68, 95 spo2 on 2 liters. cbg 149. Tenderness in lower left and right abdominal area.  Pain 4 out 10. Hx. CHF, anemia, HTN,

## 2017-05-03 NOTE — ED Notes (Signed)
Bed: WA11 Expected date:  Expected time:  Means of arrival:  Comments: EMS abdominal pain 

## 2017-05-03 NOTE — ED Provider Notes (Signed)
[facility]-EMERGENCY DEPT Provider Note: Ashley Dell, MD, FACEP  CSN: [id number] MRN: [id number] ARRIVAL: 05/03/17 at 0135 ROOM: WA11/WA11   CHIEF COMPLAINT  Constipation  Level 5 caveat: Dementia HISTORY OF PRESENT ILLNESS  05/03/17 3:00 AM [name] is a 84 y.o. female with a history of chronic constipation for several weeks.  She was seen in because she was not "acting herself" yesterday.  She is complaining of abdominal pain which she rates as a 4 out of 10 and is located in the lower abdomen.  She had a bowel movement yesterday per her daughter.  The patient herself was complaining of urinary retention on arrival.  A bedside bladder scan showed over 400 mL of urine in her bladder.  Nursing staff placed a Foley catheter which relieved her retention and she is now significantly improved.  She and her daughter deny fever, nausea, vomiting or diarrhea.  She is complaining of feeling cold.   Past Medical History:  Diagnosis Date  . Anemia   . Anxiety disorder   . Carotid artery occlusion   . Carotid bruit   . Chronic kidney disease (CKD), stage III (moderate) (HCC)   . Coronary artery disease    Remote PCI. Had BMS to RCA and DES stent to Diagonal in Jan. 2012   . Diastolic heart failure    EF 55 to 60% per cath in Jan 2012  . DVT (deep venous thrombosis) (HCC)   . Gout   . Hyperlipidemia   . Hypertension   . Ischemic cardiomyopathy    with prior EF of 35%  . NSTEMI (non-ST elevated myocardial infarction) [facility]) Jan 2012   with PCI to RCA and DX per Dr. Excell Seltzer  . Obesity   . Pneumonia 2016  . Proteinuria   . Stroke [facility]) 2013   ?  mini    . TIA (transient ischemic attack)   . Type II diabetes mellitus (HCC)    long standing    Past Surgical History:  Procedure Laterality Date  . ABDOMINAL AORTAGRAM N/A 01/17/2014   Procedure: ABDOMINAL Ronny Flurry;  Surgeon: Sherren Kerns, MD;  Location: [facility] CATH LAB;  Service: Cardiovascular;  Laterality: N/A;  . ANKLE FUSION  Bilateral   . APPENDECTOMY    . CATARACT EXTRACTION, BILATERAL Bilateral   . CORONARY ANGIOPLASTY WITH STENT PLACEMENT  04/05/2010   distal RCA  . FRACTURE SURGERY Right 2005   bilateral ankles   . TONSILLECTOMY      Family History  Problem Relation Age of Onset  . Diabetes Daughter   . CAD Neg Hx     Social History   Tobacco Use  . Smoking status: Former Games developer  . Smokeless tobacco: Never Used  . Tobacco comment: "smoked in my teens; quit after 1 year or so"  Substance Use Topics  . Alcohol use: No    Alcohol/week: 0.0 oz  . Drug use: No    Prior to Admission medications   Medication Sig Start Date End Date Taking? Authorizing Provider  aspirin EC 81 MG tablet Take 1 tablet (81 mg total) by mouth daily. 12/11/16   Albertine Grates, MD  cloNIDine (CATAPRES) 0.1 MG tablet Take 1 tablet (0.1 mg total) by mouth 2 (two) times daily. 03/22/17   Marinda Elk, MD  docusate sodium (COLACE) 100 MG capsule Take 1 capsule (100 mg total) by mouth every 12 (twelve) hours. 01/09/17   Ward, Layla Maw, DO  ezetimibe-simvastatin (VYTORIN) 10-20 MG tablet TAKE 1/2 TABLET BY  MOUTH ONCE NIGHTLY TO CONTROL CHOLESTEROL 04/13/17   Kirt Boys, DO  feeding supplement, ENSURE ENLIVE, (ENSURE ENLIVE) LIQD Take 237 mLs by mouth 2 (two) times daily between meals. 12/11/16   Albertine Grates, MD  furosemide (LASIX) 20 MG tablet TAKE ONE TABLET BY MOUTH TWICE DAILY FOR HYPERTENSION 03/24/17   Kirt Boys, DO  hydrocortisone (PROCTOZONE-HC) 2.5 % rectal cream APPLY TO RECTUM THREE TIMES A DAY FOR DISCOMFORT 04/10/17   Kirt Boys, DO  isosorbide-hydrALAZINE (BIDIL) 20-37.5 MG tablet Take 1 tablet by mouth 2 (two) times daily. 03/22/17   Marinda Elk, MD  LOTEMAX 0.5 % GEL PLACE 1 DROP IN THE LEFT EYE TWICE DAILY 03/24/17   Kirt Boys, DO  Nebivolol HCl (BYSTOLIC) 20 MG TABS Take 1 tablet (20 mg total) by mouth daily. For Blood Pressure 03/22/17   Marinda Elk, MD  nitroGLYCERIN (NITROSTAT) 0.4 MG SL  tablet Place one under tongue for chest pain, up to 3 tablets 03/24/17   Kirt Boys, DO  OLANZapine zydis (ZYPREXA) 5 MG disintegrating tablet Take 1 tablet (5 mg total) by mouth at bedtime. 04/07/17   Darlin Drop, DO  polyethylene glycol powder (GLYCOLAX/MIRALAX) powder Take 17 g by mouth daily as needed for mild constipation.  01/16/17   [provider]  timolol (BETIMOL) 0.5 % ophthalmic solution INSTILL 1 DROP INTO THE LEFT EYE DAILY 03/24/17   Kirt Boys, DO    Allergies Namzaric [memantine hcl-donepezil hcl]; Percocet [oxycodone-acetaminophen]; Sulfa antibiotics; Adhesive [tape]; Metformin and related; Tradjenta [linagliptin]; and Penicillins   REVIEW OF SYSTEMS     PHYSICAL EXAMINATION  Initial Vital Signs Blood pressure (!) 188/87, pulse 64, temperature 97.8 F (36.6 C), temperature source Oral, resp. rate 20, SpO2 97 %.  Examination General: Well-developed, well-nourished female in no acute distress; appearance consistent with age of record HENT: normocephalic; atraumatic Eyes: pupils equal, round and reactive to light; extraocular muscles intact Neck: supple Heart: regular rate and rhythm Lungs: clear to auscultation bilaterally Abdomen: soft; nondistended; mild suprapubic tenderness status post Foley placement; no masses or hepatosplenomegaly; bowel sounds present Extremities: No deformity; full range of motion; pulses normal Neurologic: Awake, alert and oriented x 2; motor function intact in all extremities and symmetric; no facial droop Skin: Warm and dry Psychiatric: Normal mood and affect   RESULTS  Summary of this visit's results, reviewed by myself:   EKG Interpretation  Date/Time:    Ventricular Rate:    PR Interval:    QRS Duration:   QT Interval:    QTC Calculation:   R Axis:     Text Interpretation:        Laboratory Studies: Results for orders placed or performed during the hospital encounter of 05/03/17 (from the past 24 hour(s))   Urinalysis, Routine w reflex microscopic     Status: Abnormal   Collection Time: 05/03/17  2:09 AM  Result Value Ref Range   Color, Urine YELLOW YELLOW   APPearance CLOUDY (A) CLEAR   Specific Gravity, Urine 1.011 1.005 - 1.030   pH 6.0 5.0 - 8.0   Glucose, UA NEGATIVE NEGATIVE mg/dL   Hgb urine dipstick SMALL (A) NEGATIVE   Bilirubin Urine NEGATIVE NEGATIVE   Ketones, ur NEGATIVE NEGATIVE mg/dL   Protein, ur 161 (A) NEGATIVE mg/dL   Nitrite NEGATIVE NEGATIVE   Leukocytes, UA LARGE (A) NEGATIVE   RBC / HPF 0-5 0 - 5 RBC/hpf   WBC, UA TOO NUMEROUS TO COUNT 0 - 5 WBC/hpf   Bacteria, UA  FEW (A) NONE SEEN   Squamous Epithelial / LPF 0-5 (A) NONE SEEN   WBC Clumps PRESENT    Imaging Studies: No results found.  ED COURSE  Nursing notes and initial vitals signs, including pulse oximetry, reviewed.  Vitals:   05/03/17 0156 05/03/17 0220  BP:  (!) 188/87  Pulse:  64  Resp:  20  Temp:  97.8 F (36.6 C)  TempSrc:  Oral  SpO2: 95% 97%   Patient has acute urinary retention likely due to acute urinary tract infection.  We will treat with Keflex.  She has taken Keflex in the past without difficulty.  PROCEDURES    ED DIAGNOSES     ICD-10-CM   1. Acute urinary retention R33.8   2. Lower urinary tract infectious disease N39.0        Osvaldo Lamping, MD 05/03/17 [phone]

## 2017-05-03 NOTE — Telephone Encounter (Signed)
I spoke with patient's daughter, Stark BrayLynda, about setting an appointment for ED follow up. Stark BrayLynda had questions about when the foley cath should be removed because they were not given any instructions at discharge.   I called [facility] ED to get clarification on foley instructions. I was told that ED provider stated that patient should keep foley in place for 2 days and then be seen by PCP to determine if foley can be removed.   I spoke with lynda and she scheduled an appt for 05/05/17.

## 2017-05-05 ENCOUNTER — Ambulatory Visit (INDEPENDENT_AMBULATORY_CARE_PROVIDER_SITE_OTHER): Payer: Medicare Other | Admitting: Nurse Practitioner

## 2017-05-05 ENCOUNTER — Encounter: Payer: Self-pay | Admitting: Nurse Practitioner

## 2017-05-05 VITALS — BP 150/80 | HR 73 | Temp 98.1°F | Wt 121.0 lb

## 2017-05-05 DIAGNOSIS — N184 Chronic kidney disease, stage 4 (severe): Secondary | ICD-10-CM

## 2017-05-05 DIAGNOSIS — R339 Retention of urine, unspecified: Secondary | ICD-10-CM

## 2017-05-05 DIAGNOSIS — N390 Urinary tract infection, site not specified: Secondary | ICD-10-CM | POA: Diagnosis not present

## 2017-05-05 LAB — URINE CULTURE: Culture: >=100000 — AB

## 2017-05-05 MED ORDER — CIPROFLOXACIN HCL 250 MG PO TABS: 250.0000 mg | ORAL_TABLET | Freq: Two times a day (BID) | ORAL | 0 refills | Status: DC

## 2017-05-05 NOTE — Patient Instructions (Signed)
To stop keflex Start cipro twice daily for 7 days Increase hydration To use probiotic twice daily for 10 days   To make sure you void by 5:30 otherwise head to the ED for re-catheterization

## 2017-05-05 NOTE — Progress Notes (Signed)
Careteam: Patient Care Team: Kirt Boys, DO as PCP - General (Internal Medicine) Rosalio Macadamia, NP as Nurse Practitioner (Cardiology) Sherrie George, MD as Consulting Physician (Ophthalmology) Sherren Kerns, MD as Consulting Physician (Vascular Surgery) Bernette Redbird, MD as Consulting Physician (Gastroenterology) Tonny Bollman, MD as Consulting Physician (Cardiology) Karie Soda, MD as Consulting Physician (General Surgery)  Advanced Directive information    Allergies  Allergen Reactions  . Namzaric [Memantine Hcl-Donepezil Hcl] Nausea Only and Other (See Comments)    confusion  . Percocet [Oxycodone-Acetaminophen] Other (See Comments)    loosy goosy  . Sulfa Antibiotics Other (See Comments)    Tongue swells  . Adhesive [Tape] Other (See Comments)    Tears skin.  Please use "paper" tape  . Metformin And Related Diarrhea and Other (See Comments)    Abnormal Kidney Functions   . Tradjenta [Linagliptin] Other (See Comments)    Low back ache, resolved once medication stopped   . Penicillins Hives and Other (See Comments)    Tolerates Ceftriaxone, Has patient had a PCN reaction causing immediate rash, facial/tongue/throat swelling, SOB or lightheadedness with hypotension: Unknown Has patient had a PCN reaction causing severe rash involving mucus membranes or skin necrosis: No Has patient had a PCN reaction that required hospitalization No Has patient had a PCN reaction occurring within the last 10 years: No If all of the above answers are "NO", then may proceed with Cephalosporin use.     Chief Complaint  Patient presents with  . Follow-up    remove foley     HPI: Patient is a 84 y.o. female seen in the office today to follow up ED visit. She went to [facility] long ED complaining of urinary retention and "not acting herself" complained of lower abdominal pain. Foley was placed and 400 cc removed. She was treated with Keflex and here today to remove foley  catheter.  Reports she is doing well. Still feeling some pressure to lower abdomen. No fevers or chills.  Pt with dementia therefore HPI and ROM very limited.  daughter here today but pt refuses to let her in for appt or help her at home.  Has been taking keflex Review of Systems:  Review of Systems  Unable to perform ROS: Dementia    Past Medical History:  Diagnosis Date  . Anemia   . Anxiety disorder   . Carotid artery occlusion   . Carotid bruit   . Chronic kidney disease (CKD), stage III (moderate) (HCC)   . Coronary artery disease    Remote PCI. Had BMS to RCA and DES stent to Diagonal in Jan. 2012   . Diastolic heart failure    EF 55 to 60% per cath in Jan 2012  . DVT (deep venous thrombosis) (HCC)   . Gout   . Hyperlipidemia   . Hypertension   . Ischemic cardiomyopathy    with prior EF of 35%  . NSTEMI (non-ST elevated myocardial infarction) [facility]) Jan 2012   with PCI to RCA and DX per Dr. Excell Seltzer  . Obesity   . Pneumonia 2016  . Proteinuria   . Stroke [facility]) 2013   ?  mini    . TIA (transient ischemic attack)   . Type II diabetes mellitus (HCC)    long standing   Past Surgical History:  Procedure Laterality Date  . ABDOMINAL AORTAGRAM N/A 01/17/2014   Procedure: ABDOMINAL Ronny Flurry;  Surgeon: Sherren Kerns, MD;  Location: [facility] CATH LAB;  Service: Cardiovascular;  Laterality: N/A;  . ANKLE FUSION Bilateral   . APPENDECTOMY    . CATARACT EXTRACTION, BILATERAL Bilateral   . CORONARY ANGIOPLASTY WITH STENT PLACEMENT  04/05/2010   distal RCA  . FRACTURE SURGERY Right 2005   bilateral ankles   . TONSILLECTOMY     Social History:   reports that she has quit smoking. she has never used smokeless tobacco. She reports that she does not drink alcohol or use drugs.  Family History  Problem Relation Age of Onset  . Diabetes Daughter   . CAD Neg Hx     Medications: Patient's Medications  New Prescriptions   No medications on file  Previous Medications    ASPIRIN EC 81 MG TABLET    Take 1 tablet (81 mg total) by mouth daily.   BIDIL 20-37.5 MG TABLET    TAKE 1 TABLET BY MOUTH TWICE DAILY   CEPHALEXIN (KEFLEX) 500 MG CAPSULE    Take 1 capsule (500 mg total) by mouth 4 (four) times daily.   CLONIDINE (CATAPRES) 0.1 MG TABLET    Take 1 tablet (0.1 mg total) by mouth 2 (two) times daily.   DOCUSATE SODIUM (COLACE) 100 MG CAPSULE    Take 1 capsule (100 mg total) by mouth every 12 (twelve) hours.   EZETIMIBE-SIMVASTATIN (VYTORIN) 10-20 MG TABLET    TAKE 1/2 TABLET BY MOUTH ONCE NIGHTLY TO CONTROL CHOLESTEROL   FEEDING SUPPLEMENT, ENSURE ENLIVE, (ENSURE ENLIVE) LIQD    Take 237 mLs by mouth 2 (two) times daily between meals.   FUROSEMIDE (LASIX) 20 MG TABLET    TAKE ONE TABLET BY MOUTH TWICE DAILY FOR HYPERTENSION   HYDROCORTISONE (PROCTOZONE-HC) 2.5 % RECTAL CREAM    APPLY TO RECTUM THREE TIMES A DAY FOR DISCOMFORT   LOTEMAX 0.5 % GEL    PLACE 1 DROP IN THE LEFT EYE TWICE DAILY   NEBIVOLOL HCL (BYSTOLIC) 20 MG TABS    Take 1 tablet (20 mg total) by mouth daily. For Blood Pressure   NITROGLYCERIN (NITROSTAT) 0.4 MG SL TABLET    Place one under tongue for chest pain, up to 3 tablets   OLANZAPINE ZYDIS (ZYPREXA) 5 MG DISINTEGRATING TABLET    Take 1 tablet (5 mg total) by mouth at bedtime.   POLYETHYLENE GLYCOL POWDER (GLYCOLAX/MIRALAX) POWDER    Take 17 g by mouth daily as needed for mild constipation.    TIMOLOL (BETIMOL) 0.5 % OPHTHALMIC SOLUTION    INSTILL 1 DROP INTO THE LEFT EYE DAILY  Modified Medications   No medications on file  Discontinued Medications   No medications on file     Physical Exam:  Vitals:   05/05/17 1009  BP: (!) 150/80  Pulse: 73  Temp: 98.1 F (36.7 C)  TempSrc: Oral  SpO2: 94%  Weight: 121 lb (54.9 kg)   Body mass index is 23.63 kg/m.  Physical Exam  Constitutional: She appears well-developed.  Frail appearing in NAD  HENT:  Mouth/Throat: Oropharynx is clear and moist.  MMM; no oral thrush  Eyes: Pupils  are equal, round, and reactive to light.  Cardiovascular: Normal rate. An irregular rhythm present. Frequent extrasystoles are present.  Murmur heard. Pulmonary/Chest: Effort normal. No stridor. No respiratory distress. She has no wheezes. She has rales.  Abdominal: Soft. Normal appearance and bowel sounds are normal. She exhibits no distension and no mass. There is no hepatomegaly. There is no tenderness. There is no rigidity and no guarding.  Genitourinary:  Genitourinary Comments: Stool covering foley catheter and  leg strap    Musculoskeletal: She exhibits edema. She exhibits no tenderness.  1+ bilaterally   Neurological: She is alert.  Skin: Skin is warm and dry.  Psychiatric: She has a normal mood and affect. Her behavior is normal. Thought content is delusional.    Labs reviewed: Basic Metabolic Panel: Recent Labs    09/08/16 1523  02/05/17 0155  03/20/17 1410  04/01/17 0450 04/02/17 0008 04/02/17 0413  04/06/17 0424 04/17/17 1447 05/02/17 1026  NA  --    < >  --    < >  --    < > 140 142 143   < > 141 144 141  K  --    < >  --    < >  --    < > 4.0 3.9 3.9   < > 4.6 5.4* 4.9  CL  --    < >  --    < >  --    < > 110 108 109   < > 107 114* 106  CO2  --    < >  --    < >  --    < > 20* 21* 20*   < > 22 20 25   GLUCOSE  --    < >  --    < >  --    < > 138* 154* 87   < > 152* 110* 158*  BUN  --    < >  --    < >  --    < > 46* 52* 56*   < > 83* 54* 42*  CREATININE  --    < >  --    < > 1.96*   < > 2.25* 2.32* 2.32*   < > 2.94* 2.02* 1.94*  CALCIUM  --    < >  --    < >  --    < > 8.1* 8.7* 8.8*   < > 8.2* 8.6 8.8  MG  --    < >  --    < > 2.3  --  2.2 2.4 2.4  --   --   --   --   PHOS  --   --   --   --   --   --  4.0  --  5.5*  --   --   --   --   TSH 0.78  --  2.485  --  3.950  --   --   --   --   --   --   --   --    < > = values in this interval not displayed.   Estimated Creatinine Clearance: 15.9 mL/min (A) (by C-G formula based on SCr of 1.94 mg/dL (H)).  Liver  Function Tests: Recent Labs    03/19/17 2304 04/01/17 0450 04/06/17 0424 04/17/17 1447  AST 17 15 13* 15  ALT 10* 8* 12* 8  ALKPHOS 52 46 46  --   BILITOT 0.4 0.6 0.7 0.3  PROT 6.1* 5.2* 5.0* 5.8*  ALBUMIN 3.2* 2.7* 2.6*  --    Recent Labs    10/01/16 1523 11/26/16 1725  LIPASE 21 18   No results for input(s): AMMONIA in the last 8760 hours. CBC: Recent Labs    03/09/17 1100  03/19/17 2304  04/03/17 1001 04/04/17 0258 04/17/17 1447  WBC 8.1   < > 4.1   < > 6.5 4.4 5.1  NEUTROABS 7.0  --  2.8  --   --   --  3,448  HGB 9.8*   < > 9.2*   < > 9.6* 7.8* 9.3*  HCT 31.5*   < > 28.7*   < > 31.0* 24.5* 29.1*  MCV 93.5   < > 94.7   < > 95.4 93.2 93.6  PLT 230   < > 256   < > 220 175 254   < > = values in this interval not displayed.   Lipid Panel: Recent Labs    08/29/16 1051 03/31/17 0741  CHOL 130  --   HDL 39*  --   LDLCALC 52  --   TRIG 196* 819*  CHOLHDL 3.3  --    TSH: Recent Labs    09/08/16 1523 02/05/17 0155 03/20/17 1410  TSH 0.78 2.485 3.950   A1C: Lab Results  Component Value Date   HGBA1C 6.5 (H) 03/20/2017     Assessment/Plan 1. Urinary tract infection without hematuria, site unspecified -poor hygiene noted and stool covering foley catheter and vaginal area. Pt was cleaned with peri-wipes and discussed with daughter about going home and cleaning with soap and water.  -to stop keflex -high risk for catheter-associated UTI at this time -culture report reviewed from ED. - ciprofloxacin (CIPRO) 250 MG tablet; Take 1 tablet (250 mg total) by mouth 2 (two) times daily.  Dispense: 14 tablet; Refill: 0  2. CKD (chronic kidney disease) stage 4, GFR 15-29 ml/min (HCC) -stable, renal dosing of Cipro  3. Urinary retention -foley removed, pt was cleaned, discussed she needs to void within 8 hours or to go to the ED for further evaluation of urinary retention.   Next appt: as scheduled  Jessica K. Biagio Borg  [facility] [phone] 8 am - 5 pm) [phone] (after hours)

## 2017-05-06 ENCOUNTER — Telehealth: Payer: Self-pay

## 2017-05-06 NOTE — Telephone Encounter (Signed)
Post ED Visit - Positive Culture Follow-up  Culture report reviewed by antimicrobial stewardship pharmacist:  []  Enzo BiNathan Batchelder, Pharm.D. []  Celedonio MiyamotoJeremy Frens, Pharm.D., BCPS AQ-ID [x]  Garvin FilaMike Maccia, Pharm.D., BCPS []  Georgina PillionElizabeth Martin, Pharm.D., BCPS []  Bell CityMinh Pham, [address] Rainbow BoulevardPharm.D., BCPS, AAHIVP []  Estella HuskMichelle Turner, Pharm.D., BCPS, AAHIVP []  Lysle Pearlachel Rumbarger, PharmD, BCPS []  Blake DivineShannon Parkey, PharmD []  [name] SamplesAndy Johnston, PharmD, BCPS  Positive urine culture Treated with Cephalexin, organism sensitive to the same and no further patient follow-up is required at this time.  Jerry CarasCullom, Exa Bomba Burnett 05/06/2017, 10:50 AM

## 2017-05-09 ENCOUNTER — Other Ambulatory Visit: Payer: Self-pay | Admitting: Internal Medicine

## 2017-05-09 DIAGNOSIS — K622 Anal prolapse: Secondary | ICD-10-CM

## 2017-05-26 ENCOUNTER — Telehealth: Payer: Self-pay | Admitting: *Deleted

## 2017-05-26 NOTE — Telephone Encounter (Signed)
Yes

## 2017-05-26 NOTE — Telephone Encounter (Signed)
[facility] asking if you will be the attending, her daughter now has legal guardianship over patient.

## 2017-05-29 ENCOUNTER — Telehealth: Payer: Self-pay | Admitting: *Deleted

## 2017-05-29 NOTE — Telephone Encounter (Signed)
Ok to request 

## 2017-05-29 NOTE — Telephone Encounter (Signed)
Ashley MullerJane Walters with Hospice Notified and agreed.

## 2017-05-29 NOTE — Telephone Encounter (Signed)
Ashley ReiningNicole with Hospice called and stated that the family is requesting a DNR. Ashley Reiningicole wants a verbal order to have Hospice Dr. Caralyn GuileSign a DNR for patient and the nurse will take to them. Please Advise.

## 2017-06-20 ENCOUNTER — Other Ambulatory Visit: Payer: Self-pay | Admitting: Internal Medicine

## 2017-06-20 DIAGNOSIS — K622 Anal prolapse: Secondary | ICD-10-CM

## 2017-06-21 NOTE — Telephone Encounter (Signed)
1.) Dr.Reek please advise if ok to continue to refill proctozone 2.5 % rectal cream for in most situations this is a temporary medication    2.) Please advise if Lotemax will continued to be fill by us or if an eye doctor should prescribe

## 2017-06-23 ENCOUNTER — Telehealth: Payer: Self-pay | Admitting: Nurse Practitioner

## 2017-06-23 NOTE — Telephone Encounter (Signed)
New Message   Pt's daughter states that the court ordered a full disciplinary evaluation on the pt and told her that her dementia  was severe and have a hearing this month for her to get full guardianship of the pt

## 2017-06-24 ENCOUNTER — Emergency Department (HOSPITAL_COMMUNITY)
Admission: EM | Admit: 2017-06-24 | Discharge: 2017-06-24 | Disposition: A | Discharge status: Home / Self Care | Payer: Medicare Other | Attending: Emergency Medicine | Admitting: Emergency Medicine

## 2017-06-24 DIAGNOSIS — E1122 Type 2 diabetes mellitus with diabetic chronic kidney disease: Secondary | ICD-10-CM | POA: Diagnosis not present

## 2017-06-24 DIAGNOSIS — Z79899 Other long term (current) drug therapy: Secondary | ICD-10-CM | POA: Insufficient documentation

## 2017-06-24 DIAGNOSIS — I252 Old myocardial infarction: Secondary | ICD-10-CM | POA: Insufficient documentation

## 2017-06-24 DIAGNOSIS — Z86718 Personal history of other venous thrombosis and embolism: Secondary | ICD-10-CM | POA: Diagnosis not present

## 2017-06-24 DIAGNOSIS — I129 Hypertensive chronic kidney disease with stage 1 through stage 4 chronic kidney disease, or unspecified chronic kidney disease: Secondary | ICD-10-CM | POA: Insufficient documentation

## 2017-06-24 DIAGNOSIS — N183 Chronic kidney disease, stage 3 (moderate): Secondary | ICD-10-CM | POA: Diagnosis not present

## 2017-06-24 DIAGNOSIS — K623 Rectal prolapse: Secondary | ICD-10-CM | POA: Diagnosis present

## 2017-06-24 DIAGNOSIS — I2510 Atherosclerotic heart disease of native coronary artery without angina pectoris: Secondary | ICD-10-CM | POA: Insufficient documentation

## 2017-06-24 DIAGNOSIS — I5032 Chronic diastolic (congestive) heart failure: Secondary | ICD-10-CM | POA: Diagnosis not present

## 2017-06-24 NOTE — ED Triage Notes (Signed)
Brought in by EMS from home , Per EMS pt complain of prolapse rectum. Per pt she felt something hanging on her rectum after she had BM today. Pt denies pain.

## 2017-06-24 NOTE — Discharge Instructions (Signed)
Continue MiraLAX and Colace. Follow-up with general surgery for further evaluation. Follow attached handout.  If you develop worsening or new concerning symptoms you can return to the emergency department for re-evaluation.

## 2017-06-24 NOTE — ED Notes (Signed)
Bed: WA02 Expected date:  Expected time:  Means of arrival:  Comments: 84yo F/prolapsed rectum

## 2017-06-24 NOTE — ED Provider Notes (Signed)
[facility]-EMERGENCY DEPT Provider Note   CSN: [id number] Arrival date & time: 06/24/17  1922     History   Chief Complaint Chief Complaint  Patient presents with  . Prolapse Rectum    HPI [name] is a 84 y.o. female with a history of rectal prolapse and other extensive medical history as listed below who presents the emergency department today for prolapsed rectum.  Patient states that she was trying to go to the bathroom when he felt tissue bulge out of her rectum after she had a bowel movement today.  She notes that normally this is able to self reduce however was not today.  She was transferred by EMS where she feels like it self reduced.  She notes no complaints at this current time and feels that her symptoms have resolved.  She is not followed by anybody for this.  She denies any abdominal pain, nausea/vomiting, fever, diarrhea, hematochezia or melena.  HPI  Past Medical History:  Diagnosis Date  . Anemia   . Anxiety disorder   . Carotid artery occlusion   . Carotid bruit   . Chronic kidney disease (CKD), stage III (moderate) (HCC)   . Coronary artery disease    Remote PCI. Had BMS to RCA and DES stent to Diagonal in Jan. 2012   . Diastolic heart failure    EF 55 to 60% per cath in Jan 2012  . DVT (deep venous thrombosis) (HCC)   . Gout   . Hyperlipidemia   . Hypertension   . Ischemic cardiomyopathy    with prior EF of 35%  . NSTEMI (non-ST elevated myocardial infarction) [facility]) Jan 2012   with PCI to RCA and DX per Dr. Excell Seltzer  . Obesity   . Pneumonia 2016  . Proteinuria   . Stroke [facility]) 2013   ?  mini    . TIA (transient ischemic attack)   . Type II diabetes mellitus (HCC)    long standing    Patient Active Problem List   Diagnosis Date Noted  . Dementia with behavioral disturbance   . Palliative care by specialist   . Advance care planning   . Goals of care, counseling/discussion   . Protein-calorie malnutrition, severe  04/01/2017  . Respiratory failure (HCC) 03/31/2017  . Pressure injury of skin 03/21/2017  . Acute renal failure superimposed on stage 3 chronic kidney disease (HCC) 03/20/2017  . Acute on chronic combined systolic (congestive) and diastolic (congestive) heart failure (HCC) 03/20/2017  . CHF (congestive heart failure) (HCC) 03/09/2017  . Acute kidney injury superimposed on CKD (HCC)stage III 02/07/2017  . Acute on chronic combined systolic and diastolic CHF (congestive heart failure) (HCC) 02/05/2017  . Urinary tract infection without hematuria   . Hypoxia 02/04/2017  . Abnormal CXR 02/04/2017  . Accelerated hypertension 02/04/2017  . LBBB (left bundle branch block) 02/04/2017  . Diarrhea 12/09/2016  . Delusions (HCC) 09/28/2016  . Medication noncompliance due to cognitive impairment 09/28/2016  . Hemorrhoids 05/04/2016  . Pain in joint, lower leg 01/20/2016  . Ptosis 11/04/2015  . Glaucoma 08/25/2015  . Dementia without behavioral disturbance 06/10/2015  . Expressive aphasia 05/26/2015  . Rectal prolapse 02/25/2015  . CKD (chronic kidney disease), stage III (HCC) 02/10/2014  . PAD (peripheral artery disease) (HCC) 01/17/2014  . Constipation 07/31/2013  . Atherosclerosis of native artery of extremity with intermittent claudication (HCC) 07/04/2013  . Anxiety disorder 02/21/2013  . Diabetes mellitus with nephropathy (HCC) 07/05/2012  . Occlusion and  stenosis of carotid artery without mention of cerebral infarction 06/21/2012  . Coronary artery disease   . Chronic diastolic heart failure, NYHA class 1 (HCC)   . Hyperlipidemia   . Hypertension   . Carotid bruit   . Gout   . Obesity   . TIA (transient ischemic attack)     Past Surgical History:  Procedure Laterality Date  . ABDOMINAL AORTAGRAM N/A 01/17/2014   Procedure: ABDOMINAL Ronny Flurry;  Surgeon: Sherren Kerns, MD;  Location: [facility] CATH LAB;  Service: Cardiovascular;  Laterality: N/A;  . ANKLE FUSION Bilateral   .  APPENDECTOMY    . CATARACT EXTRACTION, BILATERAL Bilateral   . CORONARY ANGIOPLASTY WITH STENT PLACEMENT  04/05/2010   distal RCA  . FRACTURE SURGERY Right 2005   bilateral ankles   . TONSILLECTOMY       OB History   None      Home Medications    Prior to Admission medications   Medication Sig Start Date End Date Taking? Authorizing Provider  aspirin EC 81 MG tablet Take 1 tablet (81 mg total) by mouth daily. 12/11/16   Albertine Grates, MD  BIDIL 20-37.5 MG tablet TAKE 1 TABLET BY MOUTH TWICE DAILY 05/03/17   Kirt Boys, DO  cephALEXin (KEFLEX) 500 MG capsule Take 1 capsule (500 mg total) by mouth 4 (four) times daily. 05/03/17   Molpus, John, MD  ciprofloxacin (CIPRO) 250 MG tablet Take 1 tablet (250 mg total) by mouth 2 (two) times daily. 05/05/17   Sharon Seller, NP  cloNIDine (CATAPRES) 0.1 MG tablet Take 1 tablet (0.1 mg total) by mouth 2 (two) times daily. 03/22/17   Marinda Elk, MD  docusate sodium (COLACE) 100 MG capsule Take 1 capsule (100 mg total) by mouth every 12 (twelve) hours. 01/09/17   Ward, Layla Maw, DO  ezetimibe-simvastatin (VYTORIN) 10-20 MG tablet TAKE 1/2 TABLET BY MOUTH ONCE NIGHTLY TO CONTROL CHOLESTEROL 04/13/17   Kirt Boys, DO  feeding supplement, ENSURE ENLIVE, (ENSURE ENLIVE) LIQD Take 237 mLs by mouth 2 (two) times daily between meals. 12/11/16   Albertine Grates, MD  furosemide (LASIX) 20 MG tablet TAKE ONE TABLET BY MOUTH TWICE DAILY FOR HYPERTENSION 05/10/17   Kirt Boys, DO  hydrocortisone (PROCTOZONE-HC) 2.5 % rectal cream APPLY TO RECTUM 3 TIMES DAILY FOR DISCOMFORT 06/21/17   Kirt Boys, DO  LOTEMAX 0.5 % GEL PLACE 1 DROP IN THE LEFT EYE TWICE DAILY 03/24/17   Kirt Boys, DO  Nebivolol HCl (BYSTOLIC) 20 MG TABS Take 1 tablet (20 mg total) by mouth daily. For Blood Pressure 03/22/17   Marinda Elk, MD  nitroGLYCERIN (NITROSTAT) 0.4 MG SL tablet Place one under tongue for chest pain, up to 3 tablets 03/24/17   Kirt Boys, DO    OLANZapine zydis (ZYPREXA) 5 MG disintegrating tablet Take 1 tablet (5 mg total) by mouth at bedtime. 04/07/17   Darlin Drop, DO  polyethylene glycol powder (GLYCOLAX/MIRALAX) powder Take 17 g by mouth daily as needed for mild constipation.  01/16/17   [provider]  timolol (BETIMOL) 0.5 % ophthalmic solution INSTILL 1 DROP INTO THE LEFT EYE DAILY 03/24/17   Kirt Boys, DO    Family History Family History  Problem Relation Age of Onset  . Diabetes Daughter   . CAD Neg Hx     Social History Social History   Tobacco Use  . Smoking status: Former Games developer  . Smokeless tobacco: Never Used  . Tobacco comment: "smoked in my teens;  quit after 1 year or so"  Substance Use Topics  . Alcohol use: No    Alcohol/week: 0.0 oz  . Drug use: No     Allergies   Namzaric [memantine hcl-donepezil hcl]; Percocet [oxycodone-acetaminophen]; Sulfa antibiotics; Adhesive [tape]; Metformin and related; Tradjenta [linagliptin]; and Penicillins   Review of Systems Review of Systems  All other systems reviewed and are negative.    Physical Exam Updated Vital Signs BP (!) 125/109   Pulse (!) 56   Temp 97.9 F (36.6 C) (Oral)   SpO2 94%   Physical Exam  Constitutional: She appears well-developed and well-nourished.  HENT:  Head: Normocephalic and atraumatic.  Right Ear: External ear normal.  Left Ear: External ear normal.  Eyes: Conjunctivae are normal. Right eye exhibits no discharge. Left eye exhibits no discharge. No scleral icterus.  Pulmonary/Chest: Effort normal. No respiratory distress.  Abdominal: She exhibits no distension and no ascites. There is no tenderness. There is no rigidity, no rebound, no guarding and no CVA tenderness.  Genitourinary:  Genitourinary Comments: Chaperone was present.  Patient with without pain around the rectal area. Perianal sensory intact. No induration of the skin or swelling. No masses palpated. Stool color is brown with no overt blood or  melena.  No evidence of prolapsed rectum on exam.  No evidence of thrombosed hemorrhoid.  Neurological: She is alert.  Skin: No pallor.  Psychiatric: She has a normal mood and affect.  Nursing note and vitals reviewed.    ED Treatments / Results  Labs (all labs ordered are listed, but only abnormal results are displayed) Labs Reviewed - No data to display  EKG None  Radiology No results found.  Procedures Procedures (including critical care time)  Medications Ordered in ED Medications - No data to display   Initial Impression / Assessment and Plan / ED Course  I have reviewed the triage vital signs and the nursing notes.  Pertinent labs & imaging results that were available during my care of the patient were reviewed by me and considered in my medical decision making (see chart for details).     84 y.o. female with a history of rectal prolapse presenting for prolapsed rectum.  Patient reports that this self reduced by EMS.  No complaints of abdominal pain, diarrhea, melena or hematochezia.  No systemic symptoms of illness reported.  Patient is alert and oriented.  Abdominal exam without tenderness palpation.  No evidence of prolapsed rectum that would need reduction on exam.  There is no evidence of any further emergent workup at this time.  Will refer to general surgery.  Recommended patient continue MiraLAX and Colace. Specific return precautions discussed. Time was given for all questions to be answered. The patient verbalized understanding and agreement with plan. The patient appears safe for discharge home. Patient case discussed with Dr. Criss Alvine who is in agreement with plan.  Final Clinical Impressions(s) / ED Diagnoses   Final diagnoses:  Rectal prolapse    ED Discharge Orders    None       Princella Pellegrini 06/24/17 2043    Pricilla Loveless, MD 06/24/17 [phone]

## 2017-06-25 NOTE — Telephone Encounter (Signed)
Noted  

## 2017-06-27 ENCOUNTER — Telehealth: Payer: Self-pay | Admitting: *Deleted

## 2017-06-27 NOTE — Telephone Encounter (Signed)
[name] notified.

## 2017-06-27 NOTE — Telephone Encounter (Signed)
Pt has repeatedly refused to take her medications as ordered. If she is having an acute issue, she needs to go to the ED for eval and treatmt of acute psychosis; she is not supposed to be taking [place]. She probably had an old bottle and called for a RF. I do not know where she got that bottle

## 2017-06-27 NOTE — Telephone Encounter (Signed)
LMOM to return call.

## 2017-06-27 NOTE — Telephone Encounter (Signed)
[name], daughter called and stated that patient has recently had a court ordered multidisciplinary evaluation. Evaluated on cognitive impairment and has been determined that patient needs a Guardian. Daughter has some concerns:  1.)  Patient is so agitated and angry so much of the time. Daughter wonders if there is something you can give her for this to make her happier and help her mood.   2.)Daughter is concerned wether or not patient is suppose to be on Januvia or not. Medication is not in current medication list but daughter stated that mail order has shipped her some and daughter didn't know if she should be on 25mg , 100mg  or none. Please Advise.

## 2017-07-10 NOTE — Progress Notes (Deleted)
CARDIOLOGY OFFICE NOTE  Date:  07/11/2017    [name] Date of Birth: Aug 06, 1927 Medical Record #[id number]  PCP:  Kirt Boysarter, Monica, DO  Cardiologist:  Kirt BoysGerhardt & Cooper    No chief complaint on file.   History of Present Illness: [name] is a 84 y.o. female who presents today for a follow up visit. This is a 4 month check. Seen for Dr. Excell Seltzerooper. She is a former patient of Dr. Ronnald Nianennant's. Primarily sees me.   She has multiple issues which include CAD, remote PCI, BMS to the RCA and DES to the DX in January of 2012. She has had longstanding diabetes, HTN, HLD, diastolic heart failure with EF 55 to 60% per cath back in 2012 but has been as low as 35% (2000), PVD (followed by Dr. Karle PlumberFields)withbilateral common iliac stentingin October of2015, gout, CKD and chronic anemia. She has LBBB.   I have known [name] forover20 years - there has always been an issue with her social situation, her daughters, her medicines or her doctors in some shape or fashionthat she was not happy about.  She has had more issues over the past 6 months with a rectal prolapse. Deemed high risk for surgery. Has gotten more and more confused over the past year. Family now obtaining full guardianship due to the dementia. It has been a very sad situation.   Comes in today. Here    Past Medical History:  Diagnosis Date  . Anemia   . Anxiety disorder   . Carotid artery occlusion   . Carotid bruit   . Chronic kidney disease (CKD), stage III (moderate) (HCC)   . Coronary artery disease    Remote PCI. Had BMS to RCA and DES stent to Diagonal in Jan. 2012   . Diastolic heart failure    EF 55 to 60% per cath in Jan 2012  . DVT (deep venous thrombosis) (HCC)   . Gout   . Hyperlipidemia   . Hypertension   . Ischemic cardiomyopathy    with prior EF of 35%  . NSTEMI (non-ST elevated myocardial infarction) [facility](HCC) Jan 2012   with PCI to RCA and DX per Dr. Excell Seltzerooper  . Obesity   . Pneumonia 2016  .  Proteinuria   . Stroke [facility](HCC) 2013   ?  mini    . TIA (transient ischemic attack)   . Type II diabetes mellitus (HCC)    long standing    Past Surgical History:  Procedure Laterality Date  . ABDOMINAL AORTAGRAM N/A 01/17/2014   Procedure: ABDOMINAL Ronny FlurryAORTAGRAM;  Surgeon: Sherren Kernsharles E Fields, MD;  [facility];  Service: Cardiovascular;  Laterality: N/A;  . ANKLE FUSION Bilateral   . APPENDECTOMY    . CATARACT EXTRACTION, BILATERAL Bilateral   . CORONARY ANGIOPLASTY WITH STENT PLACEMENT  04/05/2010   distal RCA  . FRACTURE SURGERY Right 2005   bilateral ankles   . TONSILLECTOMY       Medications: No outpatient medications have been marked as taking for the 07/11/17 encounter (Appointment) with Rosalio MacadamiaGerhardt, Otisha Spickler C, NP.     Allergies: Allergies  Allergen Reactions  . Namzaric [Memantine Hcl-Donepezil Hcl] Nausea Only and Other (See Comments)    confusion  . Percocet [Oxycodone-Acetaminophen] Other (See Comments)    loosy goosy  . Sulfa Antibiotics Other (See Comments)    Tongue swells  . Adhesive [Tape] Other (See Comments)    Tears skin.  Please use "paper" tape  . Metformin And Related  Diarrhea and Other (See Comments)    Abnormal Kidney Functions   . Tradjenta [Linagliptin] Other (See Comments)    Low back ache, resolved once medication stopped   . Penicillins Hives and Other (See Comments)    Tolerates Ceftriaxone, Has patient had a PCN reaction causing immediate rash, facial/tongue/throat swelling, SOB or lightheadedness with hypotension: Unknown Has patient had a PCN reaction causing severe rash involving mucus membranes or skin necrosis: No Has patient had a PCN reaction that required hospitalization No Has patient had a PCN reaction occurring within the last 10 years: No If all of the above answers are "NO", then may proceed with Cephalosporin use.     Social History: The patient  reports that she has quit smoking. She has never used smokeless tobacco. She  reports that she does not drink alcohol or use drugs.   Family History: The patient's family history includes Diabetes in her daughter.   Review of Systems: Please see the history of present illness.   Otherwise, the review of systems is positive for none.   All other systems are reviewed and negative.   Physical Exam: VS:  There were no vitals taken for this visit. Marland Kitchen  BMI There is no height or weight on file to calculate BMI.  Wt Readings from Last 3 Encounters:  05/05/17 121 lb (54.9 kg)  04/18/17 112 lb (50.8 kg)  04/17/17 120 lb (54.4 kg)    General: Pleasant. Well developed, well nourished and in no acute distress.   HEENT: Normal.  Neck: Supple, no JVD, carotid bruits, or masses noted.  Cardiac: Regular rate and rhythm. No murmurs, rubs, or gallops. No edema.  Respiratory:  Lungs are clear to auscultation bilaterally with normal work of breathing.  GI: Soft and nontender.  MS: No deformity or atrophy. Gait and ROM intact.  Skin: Warm and dry. Color is normal.  Neuro:  Strength and sensation are intact and no gross focal deficits noted.  Psych: Alert, appropriate and with normal affect.   LABORATORY DATA:  EKG:  EKG is not ordered today.  Lab Results  Component Value Date   WBC 5.1 04/17/2017   HGB 9.3 (L) 04/17/2017   HCT 29.1 (L) 04/17/2017   PLT 254 04/17/2017   GLUCOSE 158 (H) 05/02/2017   CHOL 130 08/29/2016   TRIG 819 (H) 03/31/2017   HDL 39 (L) 08/29/2016   LDLCALC 52 08/29/2016   ALT 8 04/17/2017   AST 15 04/17/2017   NA 141 05/02/2017   K 4.9 05/02/2017   CL 106 05/02/2017   CREATININE 1.94 (H) 05/02/2017   BUN 42 (H) 05/02/2017   CO2 25 05/02/2017   TSH 3.950 03/20/2017   INR 1.09 04/15/2010   HGBA1C 6.5 (H) 03/20/2017   MICROALBUR 47.5 08/24/2016     BNP (last 3 results) Recent Labs    03/19/17 2304 03/31/17 0208 04/01/17 0450  BNP 1,636.5* 1,499.8* 783.3*    ProBNP (last 3 results) No results for input(s): PROBNP in the last 8760  hours.   Other Studies Reviewed Today:  Echo 02/05/17 Study Conclusions  - Left ventricle: The cavity size was normal. Wall thickness was normal. Systolic function was mildly reduced. The estimated ejection fraction was in the range of 45% to 50%. Hypokinesis of the basal-midinferior and inferoseptal myocardium; consistent with ischemia in the distribution of the right coronary artery. Features are consistent with a pseudonormal left ventricular filling pattern, with concomitant abnormal relaxation and increased filling pressure (grade 2 diastolic dysfunction). -  Mitral valve: Calcified annulus. There was mild regurgitation. - Left atrium: The atrium was severely dilated. - Pulmonary arteries: PA peak pressure: 31 mm Hg (S).  Assessment/Plan:  1. Rectal prolapse - I have called [facility] - spoke to staff their who have consulted with Dr. Michaell Cowing who has seen her in the past - he is agreeable to her seeing Dr. Maisie Fus as a second opinion - perhaps there is some non surgical option available - but this will be on the condition that a family member is with her in order for a plan of care to be explained/heard, etc.  At first when I told her she was adamant that no one would be coming - says they want to "kill me and want my house. I am leaving my house to [employer] She did finally agree to have one of children present. Appointment has been made with Dr. Maisie Fus - I am most appreciative. She would be at high risk for any surgical intervention - she has been told this and I have told her family this in the past as well. She continues to tell me that she "will take my chances".   2. Diastolic HF - Weight is stable. No ACE/ARB due to CKD. Would favor conservative management.   3. Dementia - clearly progressing - this is the most pressing issue and most concerning to me. She continues to dose her own medicines - not able to verify and quite resistant for any help. Very difficult  situation.   4. HLD - on Vytoin  5. HTN - BP is ok on her current regimen.   Current medicines are reviewed with the patient today.  The patient does not have concerns regarding medicines other than what has been noted above.  The following changes have been made:  See above.  Labs/ tests ordered today include:   No orders of the defined types were placed in this encounter.    Disposition:   FU with me as needed. Would now defer her care to PCP and would favor hospice services.    Patient is agreeable to this plan and will call if any problems develop in the interim.   SignedNorma Fredrickson, NP  07/11/2017 1:40 PM  [facility] [address] [place], [place]  [address] Phone: [phone] Fax: [phone]

## 2017-07-11 ENCOUNTER — Ambulatory Visit: Payer: Medicare Other | Admitting: Nurse Practitioner

## 2017-07-17 ENCOUNTER — Telehealth: Payer: Self-pay | Admitting: Internal Medicine

## 2017-07-17 NOTE — Telephone Encounter (Signed)
I spoke with the patient's daughter, [name], about scheduling AWV/OV.  She stated that she now has guardianship over her mother and is trying to place her in a facility.  Her mom is being followed by hospice, but [name] will call us back if she needs FL2 form completed. VDM (DD)

## 2017-07-19 ENCOUNTER — Encounter (INDEPENDENT_AMBULATORY_CARE_PROVIDER_SITE_OTHER): Payer: Medicare Other | Admitting: Ophthalmology

## 2017-07-25 ENCOUNTER — Ambulatory Visit (INDEPENDENT_AMBULATORY_CARE_PROVIDER_SITE_OTHER): Payer: Medicare Other | Admitting: Sports Medicine

## 2017-07-25 ENCOUNTER — Encounter: Payer: Self-pay | Admitting: Sports Medicine

## 2017-07-25 DIAGNOSIS — M79676 Pain in unspecified toe(s): Secondary | ICD-10-CM

## 2017-07-25 DIAGNOSIS — B351 Tinea unguium: Secondary | ICD-10-CM | POA: Diagnosis not present

## 2017-07-25 DIAGNOSIS — E0851 Diabetes mellitus due to underlying condition with diabetic peripheral angiopathy without gangrene: Secondary | ICD-10-CM

## 2017-07-25 NOTE — Progress Notes (Signed)
Subjective: [name] is a 84 y.o. female patient with history of diabetes who presents to office today complaining of long,mildly painful nails  while in shoes; unable to trim. Patient is hard of hearing and is in wheelchair with difficulty caring for self.  Patient Active Problem List   Diagnosis Date Noted  . Dementia with behavioral disturbance   . Palliative care by specialist   . Advance care planning   . Goals of care, counseling/discussion   . Protein-calorie malnutrition, severe 04/01/2017  . Respiratory failure (HCC) 03/31/2017  . Pressure injury of skin 03/21/2017  . Acute renal failure superimposed on stage 3 chronic kidney disease (HCC) 03/20/2017  . Acute on chronic combined systolic (congestive) and diastolic (congestive) heart failure (HCC) 03/20/2017  . CHF (congestive heart failure) (HCC) 03/09/2017  . Acute kidney injury superimposed on CKD (HCC)stage III 02/07/2017  . Acute on chronic combined systolic and diastolic CHF (congestive heart failure) (HCC) 02/05/2017  . Urinary tract infection without hematuria   . Hypoxia 02/04/2017  . Abnormal CXR 02/04/2017  . Accelerated hypertension 02/04/2017  . LBBB (left bundle branch block) 02/04/2017  . Diarrhea 12/09/2016  . Delusions (HCC) 09/28/2016  . Medication noncompliance due to cognitive impairment 09/28/2016  . Hemorrhoids 05/04/2016  . Pain in joint, lower leg 01/20/2016  . Ptosis 11/04/2015  . Glaucoma 08/25/2015  . Dementia without behavioral disturbance 06/10/2015  . Expressive aphasia 05/26/2015  . Rectal prolapse 02/25/2015  . CKD (chronic kidney disease), stage III (HCC) 02/10/2014  . PAD (peripheral artery disease) (HCC) 01/17/2014  . Constipation 07/31/2013  . Atherosclerosis of native artery of extremity with intermittent claudication (HCC) 07/04/2013  . Anxiety disorder 02/21/2013  . Diabetes mellitus with nephropathy (HCC) 07/05/2012  . Occlusion and stenosis of carotid artery without mention  of cerebral infarction 06/21/2012  . Coronary artery disease   . Chronic diastolic heart failure, NYHA class 1 (HCC)   . Hyperlipidemia   . Hypertension   . Carotid bruit   . Gout   . Obesity   . TIA (transient ischemic attack)    Current Outpatient Medications on File Prior to Visit  Medication Sig Dispense Refill  . aspirin EC 81 MG tablet Take 1 tablet (81 mg total) by mouth daily. 30 tablet 0  . BIDIL 20-37.5 MG tablet TAKE 1 TABLET BY MOUTH TWICE DAILY 60 tablet 5  . cephALEXin (KEFLEX) 500 MG capsule Take 1 capsule (500 mg total) by mouth 4 (four) times daily. 28 capsule 0  . ciprofloxacin (CIPRO) 250 MG tablet Take 1 tablet (250 mg total) by mouth 2 (two) times daily. 14 tablet 0  . cloNIDine (CATAPRES) 0.1 MG tablet Take 1 tablet (0.1 mg total) by mouth 2 (two) times daily. 60 tablet 11  . docusate sodium (COLACE) 100 MG capsule Take 1 capsule (100 mg total) by mouth every 12 (twelve) hours. 60 capsule 0  . ezetimibe-simvastatin (VYTORIN) 10-20 MG tablet TAKE 1/2 TABLET BY MOUTH ONCE NIGHTLY TO CONTROL CHOLESTEROL 15 tablet 5  . feeding supplement, ENSURE ENLIVE, (ENSURE ENLIVE) LIQD Take 237 mLs by mouth 2 (two) times daily between meals. 237 mL 12  . furosemide (LASIX) 20 MG tablet TAKE ONE TABLET BY MOUTH TWICE DAILY FOR HYPERTENSION 60 tablet 0  . hydrocortisone (PROCTOZONE-HC) 2.5 % rectal cream APPLY TO RECTUM 3 TIMES DAILY FOR DISCOMFORT 30 g 0  . LOTEMAX 0.5 % GEL PLACE 1 DROP IN THE LEFT EYE TWICE DAILY 5 g 0  . Nebivolol HCl (BYSTOLIC)  20 MG TABS Take 1 tablet (20 mg total) by mouth daily. For Blood Pressure 30 tablet 2  . nitroGLYCERIN (NITROSTAT) 0.4 MG SL tablet Place one under tongue for chest pain, up to 3 tablets 25 tablet 3  . OLANZapine zydis (ZYPREXA) 5 MG disintegrating tablet Take 1 tablet (5 mg total) by mouth at bedtime. 15 tablet 0  . polyethylene glycol powder (GLYCOLAX/MIRALAX) powder Take 17 g by mouth daily as needed for mild constipation.     . timolol  (BETIMOL) 0.5 % ophthalmic solution INSTILL 1 DROP INTO THE LEFT EYE DAILY 10 mL 0   No current facility-administered medications on file prior to visit.    Allergies  Allergen Reactions  . Namzaric [Memantine Hcl-Donepezil Hcl] Nausea Only and Other (See Comments)    confusion  . Percocet [Oxycodone-Acetaminophen] Other (See Comments)    loosy goosy  . Sulfa Antibiotics Other (See Comments)    Tongue swells  . Adhesive [Tape] Other (See Comments)    Tears skin.  Please use "paper" tape  . Metformin And Related Diarrhea and Other (See Comments)    Abnormal Kidney Functions   . Tradjenta [Linagliptin] Other (See Comments)    Low back ache, resolved once medication stopped   . Penicillins Hives and Other (See Comments)    Tolerates Ceftriaxone, Has patient had a PCN reaction causing immediate rash, facial/tongue/throat swelling, SOB or lightheadedness with hypotension: Unknown Has patient had a PCN reaction causing severe rash involving mucus membranes or skin necrosis: No Has patient had a PCN reaction that required hospitalization No Has patient had a PCN reaction occurring within the last 10 years: No If all of the above answers are "NO", then may proceed with Cephalosporin use.     Recent Results (from the past 2160 hour(s))  Basic metabolic panel     Status: Abnormal   Collection Time: 05/02/17 10:26 AM  Result Value Ref Range   Glucose, Bld 158 (H) 65 - 139 mg/dL    Comment: .        Non-fasting reference interval .    BUN 42 (H) 7 - 25 mg/dL   Creat 1.61 (H) 0.96 - 0.88 mg/dL    Comment: For patients >67 years of age, the reference limit for Creatinine is approximately 13% higher for people identified as African-American. .    BUN/Creatinine Ratio 22 6 - 22 (calc)   Sodium 141 135 - 146 mmol/L   Potassium 4.9 3.5 - 5.3 mmol/L   Chloride 106 98 - 110 mmol/L   CO2 25 20 - 32 mmol/L   Calcium 8.8 8.6 - 10.4 mg/dL  Urinalysis, Routine w reflex microscopic     Status:  Abnormal   Collection Time: 05/03/17  2:09 AM  Result Value Ref Range   Color, Urine YELLOW YELLOW   APPearance CLOUDY (A) CLEAR   Specific Gravity, Urine 1.011 1.005 - 1.030   pH 6.0 5.0 - 8.0   Glucose, UA NEGATIVE NEGATIVE mg/dL   Hgb urine dipstick SMALL (A) NEGATIVE   Bilirubin Urine NEGATIVE NEGATIVE   Ketones, ur NEGATIVE NEGATIVE mg/dL   Protein, ur 045 (A) NEGATIVE mg/dL   Nitrite NEGATIVE NEGATIVE   Leukocytes, UA LARGE (A) NEGATIVE   RBC / HPF 0-5 0 - 5 RBC/hpf   WBC, UA TOO NUMEROUS TO COUNT 0 - 5 WBC/hpf   Bacteria, UA FEW (A) NONE SEEN   Squamous Epithelial / LPF 0-5 (A) NONE SEEN   WBC Clumps PRESENT  Comment: Performed at [facility], [address], [place], [place] [address]  Urine culture     Status: Abnormal   Collection Time: 05/03/17  2:09 AM  Result Value Ref Range   Specimen Description      URINE, CLEAN CATCH Performed at [facility], [address] [place], [place] [address]    Special Requests      NONE Performed at [facility], [address] [place], [place] [address]    Culture >=100,000 COLONIES/mL CITROBACTER FARMERI (A)    Report Status 05/05/2017 FINAL    Organism ID, Bacteria CITROBACTER FARMERI (A)       Susceptibility   Citrobacter farmeri - MIC*    CEFAZOLIN >=64 RESISTANT Resistant     CEFTRIAXONE <=1 SENSITIVE Sensitive     CIPROFLOXACIN <=0.25 SENSITIVE Sensitive     GENTAMICIN <=1 SENSITIVE Sensitive     IMIPENEM <=0.25 SENSITIVE Sensitive     NITROFURANTOIN 32 SENSITIVE Sensitive     TRIMETH/SULFA <=20 SENSITIVE Sensitive     PIP/TAZO <=4 SENSITIVE Sensitive     * >=100,000 COLONIES/mL CITROBACTER FARMERI    Objective: General: Patient is awake, alert, and oriented x 3 and in no acute distress.  Integument: Skin is warm, dry and supple bilateral. Nails are tender, long, thickened and  dystrophic with subungual debris, consistent with onychomycosis, 1-5  bilateral. No signs of infection. No open lesions or preulcerative lesions present bilateral. Remaining integument unremarkable.  Vasculature:  Dorsalis Pedis pulse 1/4 bilateral. Posterior Tibial pulse  0/4 bilateral.  Capillary fill time <5sec 1-5 bilateral.No hair growth to the level of the digits. Temperature gradient within normal limits. No varicosities present bilateral. No edema present bilateral.   Neurology: The patient has absent bilateral and difficult to discern due to hard of hearing   Musculoskeletal: Asymptomatic planus pedal deformities noted bilateral. Muscular strength 4/5 in all lower extremity muscular groups bilateral without pain on range of motion wheelchair gait. No tenderness with calf compression bilateral.  Assessment and Plan: Problem List Items Addressed This Visit    None    Visit Diagnoses    Pain due to onychomycosis of toenail    -  Primary   Diabetes mellitus due to underlying condition, controlled, with diabetic peripheral angiopathy without gangrene, without long-term current use of insulin (HCC)         -Examined patient. -Discussed and educated patient on diabetic foot care, especially with  regards to the vascular, neurological and musculoskeletal systems.  -Stressed the importance of good glycemic control and the detriment of not  controlling glucose levels in relation to the foot. -Mechanically debrided all nails 1-5 bilateral using sterile nail nipper and filed with dremel without incident  -Patient to return  in 3 months for at risk foot care -Patient advised to call the office if any problems or questions arise in the meantime.  Asencion Islam, DPM

## 2017-07-27 ENCOUNTER — Telehealth: Payer: Self-pay | Admitting: *Deleted

## 2017-07-27 NOTE — Telephone Encounter (Signed)
[name] with Hospice called requesting a verbal order to Recert patient for Hospice. Verbal order given.

## 2017-07-28 ENCOUNTER — Other Ambulatory Visit: Payer: Self-pay | Admitting: Internal Medicine

## 2017-08-01 ENCOUNTER — Encounter (INDEPENDENT_AMBULATORY_CARE_PROVIDER_SITE_OTHER): Payer: Medicare Other | Admitting: Ophthalmology

## 2017-08-01 ENCOUNTER — Other Ambulatory Visit: Payer: Self-pay | Admitting: Internal Medicine

## 2017-08-01 DIAGNOSIS — K622 Anal prolapse: Secondary | ICD-10-CM

## 2017-08-23 NOTE — Addendum Note (Signed)
Addended by: Kirt BoysARTER, Tauri Ethington on: 08/23/2017 09:27 AM   Modules accepted: Level of Service

## 2017-08-24 ENCOUNTER — Emergency Department (HOSPITAL_COMMUNITY)
Admission: EM | Admit: 2017-08-24 | Discharge: 2017-08-24 | Disposition: A | Discharge status: Home / Self Care | Attending: Emergency Medicine | Admitting: Emergency Medicine

## 2017-08-24 ENCOUNTER — Emergency Department (HOSPITAL_COMMUNITY)

## 2017-08-24 DIAGNOSIS — Z86718 Personal history of other venous thrombosis and embolism: Secondary | ICD-10-CM | POA: Diagnosis not present

## 2017-08-24 DIAGNOSIS — F0391 Unspecified dementia with behavioral disturbance: Secondary | ICD-10-CM | POA: Diagnosis not present

## 2017-08-24 DIAGNOSIS — Z79899 Other long term (current) drug therapy: Secondary | ICD-10-CM | POA: Insufficient documentation

## 2017-08-24 DIAGNOSIS — I2510 Atherosclerotic heart disease of native coronary artery without angina pectoris: Secondary | ICD-10-CM | POA: Diagnosis not present

## 2017-08-24 DIAGNOSIS — E785 Hyperlipidemia, unspecified: Secondary | ICD-10-CM | POA: Diagnosis not present

## 2017-08-24 DIAGNOSIS — R627 Adult failure to thrive: Secondary | ICD-10-CM | POA: Insufficient documentation

## 2017-08-24 DIAGNOSIS — I5032 Chronic diastolic (congestive) heart failure: Secondary | ICD-10-CM | POA: Insufficient documentation

## 2017-08-24 DIAGNOSIS — I130 Hypertensive heart and chronic kidney disease with heart failure and stage 1 through stage 4 chronic kidney disease, or unspecified chronic kidney disease: Secondary | ICD-10-CM | POA: Insufficient documentation

## 2017-08-24 DIAGNOSIS — Z8673 Personal history of transient ischemic attack (TIA), and cerebral infarction without residual deficits: Secondary | ICD-10-CM | POA: Insufficient documentation

## 2017-08-24 DIAGNOSIS — I255 Ischemic cardiomyopathy: Secondary | ICD-10-CM | POA: Insufficient documentation

## 2017-08-24 DIAGNOSIS — Z87891 Personal history of nicotine dependence: Secondary | ICD-10-CM | POA: Insufficient documentation

## 2017-08-24 DIAGNOSIS — Z7982 Long term (current) use of aspirin: Secondary | ICD-10-CM | POA: Diagnosis not present

## 2017-08-24 DIAGNOSIS — E1122 Type 2 diabetes mellitus with diabetic chronic kidney disease: Secondary | ICD-10-CM | POA: Insufficient documentation

## 2017-08-24 DIAGNOSIS — I252 Old myocardial infarction: Secondary | ICD-10-CM | POA: Insufficient documentation

## 2017-08-24 DIAGNOSIS — N183 Chronic kidney disease, stage 3 (moderate): Secondary | ICD-10-CM | POA: Insufficient documentation

## 2017-08-24 LAB — CBC WITH DIFFERENTIAL/PLATELET
Basophils Absolute: 0 10*3/uL (ref 0.0–0.1)
Basophils Relative: 0 %
Eosinophils Absolute: 0.1 10*3/uL (ref 0.0–0.7)
Eosinophils Relative: 1 %
HEMATOCRIT: 41.5 % (ref 36.0–46.0)
HEMOGLOBIN: 13.3 g/dL (ref 12.0–15.0)
LYMPHS ABS: 1.1 10*3/uL (ref 0.7–4.0)
LYMPHS PCT: 24 %
MCH: 29.7 pg (ref 26.0–34.0)
MCHC: 32 g/dL (ref 30.0–36.0)
MCV: 92.6 fL (ref 78.0–100.0)
MONOS PCT: 6 %
Monocytes Absolute: 0.3 10*3/uL (ref 0.1–1.0)
NEUTROS ABS: 3.3 10*3/uL (ref 1.7–7.7)
NEUTROS PCT: 69 %
Platelets: 145 10*3/uL — ABNORMAL LOW (ref 150–400)
RBC: 4.48 MIL/uL (ref 3.87–5.11)
RDW: 13.7 % (ref 11.5–15.5)
WBC: 4.7 10*3/uL (ref 4.0–10.5)

## 2017-08-24 LAB — COMPREHENSIVE METABOLIC PANEL
ALK PHOS: 67 U/L (ref 38–126)
ALT: 15 U/L (ref 14–54)
ANION GAP: 11 (ref 5–15)
AST: 24 U/L (ref 15–41)
Albumin: 4 g/dL (ref 3.5–5.0)
BUN: 50 mg/dL — ABNORMAL HIGH (ref 6–20)
CALCIUM: 9.2 mg/dL (ref 8.9–10.3)
CO2: 30 mmol/L (ref 22–32)
Chloride: 101 mmol/L (ref 101–111)
Creatinine, Ser: 2.49 mg/dL — ABNORMAL HIGH (ref 0.44–1.00)
GFR calc non Af Amer: 16 mL/min — ABNORMAL LOW (ref >60)
GFR, EST AFRICAN AMERICAN: 19 mL/min — AB (ref >60)
Glucose, Bld: 203 mg/dL — ABNORMAL HIGH (ref 65–99)
POTASSIUM: 4.4 mmol/L (ref 3.5–5.1)
SODIUM: 142 mmol/L (ref 135–145)
TOTAL PROTEIN: 6.8 g/dL (ref 6.5–8.1)
Total Bilirubin: 0.6 mg/dL (ref 0.3–1.2)

## 2017-08-24 LAB — CK: Total CK: 67 U/L (ref 38–234)

## 2017-08-24 LAB — URINALYSIS, ROUTINE W REFLEX MICROSCOPIC
BILIRUBIN URINE: NEGATIVE
GLUCOSE, UA: NEGATIVE mg/dL
Hgb urine dipstick: NEGATIVE
KETONES UR: NEGATIVE mg/dL
LEUKOCYTES UA: NEGATIVE
NITRITE: NEGATIVE
PH: 6 (ref 5.0–8.0)
Protein, ur: 100 mg/dL — AB
Specific Gravity, Urine: 1.008 (ref 1.005–1.030)

## 2017-08-24 LAB — I-STAT CG4 LACTIC ACID, ED: Lactic Acid, Venous: 1.63 mmol/L (ref 0.5–1.9)

## 2017-08-24 MED ORDER — ISOSORB DINITRATE-HYDRALAZINE 20-37.5 MG PO TABS
1.0000 | ORAL_TABLET | Freq: Once | ORAL | Status: AC
  Administered 2017-08-24: 1 via ORAL
  Filled 2017-08-24: qty 1

## 2017-08-24 MED ORDER — NEBIVOLOL HCL 10 MG PO TABS
20.0000 mg | ORAL_TABLET | Freq: Every day | ORAL | Status: DC
  Administered 2017-08-24: 20 mg via ORAL
  Filled 2017-08-24: qty 2

## 2017-08-24 MED ORDER — CLONIDINE HCL 0.1 MG PO TABS
0.1000 mg | ORAL_TABLET | Freq: Once | ORAL | Status: AC
  Administered 2017-08-24: 0.1 mg via ORAL
  Filled 2017-08-24: qty 1

## 2017-08-24 MED ORDER — SODIUM CHLORIDE 0.9 % IV BOLUS
1000.0000 mL | Freq: Once | INTRAVENOUS | Status: AC
  Administered 2017-08-24: 1000 mL via INTRAVENOUS

## 2017-08-24 NOTE — ED Triage Notes (Signed)
Pt BIB [facility] from home. A friend came by to check on the pt and found her soiled and unable to take care of herself. Pt has hx of dementia and lives with her daughter who is currently in the hospital. Pt is unable to live by herself at this time.

## 2017-08-24 NOTE — ED Notes (Signed)
Bed: WA06 Expected date:  Expected time:  Means of arrival:  Comments: EMS FTT

## 2017-08-24 NOTE — ED Provider Notes (Signed)
[facility]-EMERGENCY DEPT Provider Note   CSN: [id number] Arrival date & time: 08/24/17  1807     History   Chief Complaint Chief Complaint  Patient presents with  . Failure To Thrive    HPI [name] is a 84 y.o. female.  84 yo F with a chief complaint of being unable to take care of herself at home.  The patient is normally cared for by her daughter.  Unfortunately her daughter became ill and was admitted into the hospital.  For the past 2 days the patient has been stuck in the same position at home.  She has been unable to eat or drink.   The history is provided by the patient.  Illness  This is a new problem. The current episode started yesterday. The problem occurs constantly. The problem has not changed since onset.Pertinent negatives include no chest pain, no headaches and no shortness of breath. Nothing aggravates the symptoms. Nothing relieves the symptoms. She has tried nothing for the symptoms.    Past Medical History:  Diagnosis Date  . Anemia   . Anxiety disorder   . Carotid artery occlusion   . Carotid bruit   . Chronic kidney disease (CKD), stage III (moderate) (HCC)   . Coronary artery disease    Remote PCI. Had BMS to RCA and DES stent to Diagonal in Jan. 2012   . Diastolic heart failure    EF 55 to 60% per cath in Jan 2012  . DVT (deep venous thrombosis) (HCC)   . Gout   . Hyperlipidemia   . Hypertension   . Ischemic cardiomyopathy    with prior EF of 35%  . NSTEMI (non-ST elevated myocardial infarction) [facility]) Jan 2012   with PCI to RCA and DX per Dr. Excell Seltzer  . Obesity   . Pneumonia 2016  . Proteinuria   . Stroke [facility]) 2013   ?  mini    . TIA (transient ischemic attack)   . Type II diabetes mellitus (HCC)    long standing    Patient Active Problem List   Diagnosis Date Noted  . Dementia with behavioral disturbance   . Palliative care by specialist   . Advance care planning   . Goals of care, counseling/discussion     . Protein-calorie malnutrition, severe 04/01/2017  . Respiratory failure (HCC) 03/31/2017  . Pressure injury of skin 03/21/2017  . Acute renal failure superimposed on stage 3 chronic kidney disease (HCC) 03/20/2017  . Acute on chronic combined systolic (congestive) and diastolic (congestive) heart failure (HCC) 03/20/2017  . CHF (congestive heart failure) (HCC) 03/09/2017  . Acute kidney injury superimposed on CKD (HCC)stage III 02/07/2017  . Acute on chronic combined systolic and diastolic CHF (congestive heart failure) (HCC) 02/05/2017  . Urinary tract infection without hematuria   . Hypoxia 02/04/2017  . Abnormal CXR 02/04/2017  . Accelerated hypertension 02/04/2017  . LBBB (left bundle branch block) 02/04/2017  . Diarrhea 12/09/2016  . Delusions (HCC) 09/28/2016  . Medication noncompliance due to cognitive impairment 09/28/2016  . Hemorrhoids 05/04/2016  . Pain in joint, lower leg 01/20/2016  . Ptosis 11/04/2015  . Glaucoma 08/25/2015  . Dementia without behavioral disturbance 06/10/2015  . Expressive aphasia 05/26/2015  . Rectal prolapse 02/25/2015  . CKD (chronic kidney disease), stage III (HCC) 02/10/2014  . PAD (peripheral artery disease) (HCC) 01/17/2014  . Constipation 07/31/2013  . Atherosclerosis of native artery of extremity with intermittent claudication (HCC) 07/04/2013  . Anxiety disorder 02/21/2013  .  Diabetes mellitus with nephropathy (HCC) 07/05/2012  . Occlusion and stenosis of carotid artery without mention of cerebral infarction 06/21/2012  . Coronary artery disease   . Chronic diastolic heart failure, NYHA class 1 (HCC)   . Hyperlipidemia   . Hypertension   . Carotid bruit   . Gout   . Obesity   . TIA (transient ischemic attack)     Past Surgical History:  Procedure Laterality Date  . ABDOMINAL AORTAGRAM N/A 01/17/2014   Procedure: ABDOMINAL Ronny FlurryAORTAGRAM;  Surgeon: Sherren Kernsharles E Fields, MD;  [facility];  Service: Cardiovascular;  Laterality:  N/A;  . ANKLE FUSION Bilateral   . APPENDECTOMY    . CATARACT EXTRACTION, BILATERAL Bilateral   . CORONARY ANGIOPLASTY WITH STENT PLACEMENT  04/05/2010   distal RCA  . FRACTURE SURGERY Right 2005   bilateral ankles   . TONSILLECTOMY       OB History   None      Home Medications    Prior to Admission medications   Medication Sig Start Date End Date Taking? Authorizing Provider  aspirin EC 81 MG tablet Take 1 tablet (81 mg total) by mouth daily. 12/11/16  Yes Albertine GratesXu, Fang, MD  BIDIL 20-37.5 MG tablet TAKE 1 TABLET BY MOUTH TWICE DAILY 05/03/17  Yes Montez Moritaarter, Monica, DO  BYSTOLIC 20 MG TABS TAKE 1 TABLET BY MOUTH EVERY DAY FOR BLOOD PRESSURE 07/28/17  Yes Kirt Boysarter, Monica, DO  cloNIDine (CATAPRES) 0.1 MG tablet Take 1 tablet (0.1 mg total) by mouth 2 (two) times daily. 03/22/17  Yes Marinda ElkFeliz Ortiz, Abraham, MD  docusate sodium (COLACE) 100 MG capsule Take 1 capsule (100 mg total) by mouth every 12 (twelve) hours. 01/09/17  Yes Ward, Layla MawKristen N, DO  ezetimibe-simvastatin (VYTORIN) 10-20 MG tablet TAKE 1/2 TABLET BY MOUTH ONCE NIGHTLY TO CONTROL CHOLESTEROL 04/13/17  Yes Montez Moritaarter, HulettMonica, DO  feeding supplement, ENSURE ENLIVE, (ENSURE ENLIVE) LIQD Take 237 mLs by mouth 2 (two) times daily between meals. 12/11/16  Yes Albertine GratesXu, Fang, MD  furosemide (LASIX) 20 MG tablet TAKE ONE TABLET BY MOUTH TWICE DAILY FOR HYPERTENSION 05/10/17  Yes Montez Moritaarter, Monica, DO  hydrocortisone (PROCTOZONE-HC) 2.5 % rectal cream APPLY TO RECTUM 3 TIMES DAILY FOR DISCOMFORT 08/02/17  Yes Montez Moritaarter, Monica, DO  LOTEMAX 0.5 % GEL PLACE 1 DROP IN THE LEFT EYE TWICE DAILY 03/24/17  Yes Kirt Boysarter, Monica, DO  nitroGLYCERIN (NITROSTAT) 0.4 MG SL tablet Place one under tongue for chest pain, up to 3 tablets 03/24/17  Yes Montez Moritaarter, Monica, DO  polyethylene glycol powder (GLYCOLAX/MIRALAX) powder Take 17 g by mouth daily as needed for mild constipation.  01/16/17  Yes [provider]  timolol (BETIMOL) 0.5 % ophthalmic solution INSTILL 1 DROP INTO THE  LEFT EYE DAILY 03/24/17  Yes Montez Moritaarter, Monica, DO  cephALEXin (KEFLEX) 500 MG capsule Take 1 capsule (500 mg total) by mouth 4 (four) times daily. Patient not taking: Reported on 08/24/2017 05/03/17   Molpus, Jonny RuizJohn, MD  ciprofloxacin (CIPRO) 250 MG tablet Take 1 tablet (250 mg total) by mouth 2 (two) times daily. Patient not taking: Reported on 08/24/2017 05/05/17   Sharon SellerEubanks, Jessica K, NP  OLANZapine zydis (ZYPREXA) 5 MG disintegrating tablet Take 1 tablet (5 mg total) by mouth at bedtime. Patient not taking: Reported on 08/24/2017 04/07/17   Darlin DropHall, Carole N, DO    Family History Family History  Problem Relation Age of Onset  . Diabetes Daughter   . CAD Neg Hx     Social History Social History  Tobacco Use  . Smoking status: Former Games developer  . Smokeless tobacco: Never Used  . Tobacco comment: "smoked in my teens; quit after 1 year or so"  Substance Use Topics  . Alcohol use: No    Alcohol/week: 0.0 oz  . Drug use: No     Allergies   Namzaric [memantine hcl-donepezil hcl]; Percocet [oxycodone-acetaminophen]; Sulfa antibiotics; Adhesive [tape]; Metformin and related; Tradjenta [linagliptin]; and Penicillins   Review of Systems Review of Systems  Constitutional: Negative for chills and fever.  HENT: Negative for congestion and rhinorrhea.   Eyes: Negative for redness and visual disturbance.  Respiratory: Negative for shortness of breath and wheezing.   Cardiovascular: Negative for chest pain and palpitations.  Gastrointestinal: Negative for nausea and vomiting.  Genitourinary: Negative for dysuria and urgency.  Musculoskeletal: Negative for arthralgias and myalgias.  Skin: Negative for pallor and wound.  Neurological: Negative for dizziness and headaches.     Physical Exam Updated Vital Signs BP (!) 130/50 (BP Location: Right Arm)   Pulse 60   Temp 97.8 F (36.6 C) (Oral)   Resp 16   SpO2 (!) 65%   Physical Exam  Constitutional: She is oriented to person, place, and time. She  appears well-developed and well-nourished. No distress.  Disheveled. filthy  HENT:  Head: Normocephalic and atraumatic.  Eyes: Pupils are equal, round, and reactive to light. EOM are normal.  Neck: Normal range of motion. Neck supple.  Cardiovascular: Normal rate and regular rhythm. Exam reveals no gallop and no friction rub.  No murmur heard. Pulmonary/Chest: Effort normal. She has no wheezes. She has no rales.  Abdominal: Soft. She exhibits no distension. There is no tenderness.  Musculoskeletal: She exhibits no edema or tenderness.  Neurological: She is alert and oriented to person, place, and time.  Skin: Skin is warm and dry. She is not diaphoretic.  Psychiatric: She has a normal mood and affect. Her behavior is normal.  Nursing note and vitals reviewed.    ED Treatments / Results  Labs (all labs ordered are listed, but only abnormal results are displayed) Labs Reviewed  CBC WITH DIFFERENTIAL/PLATELET - Abnormal; Notable for the following components:      Result Value   Platelets 145 (*)    All other components within normal limits  COMPREHENSIVE METABOLIC PANEL - Abnormal; Notable for the following components:   Glucose, Bld 203 (*)    BUN 50 (*)    Creatinine, Ser 2.49 (*)    GFR calc non Af Amer 16 (*)    GFR calc Af Amer 19 (*)    All other components within normal limits  URINALYSIS, ROUTINE W REFLEX MICROSCOPIC - Abnormal; Notable for the following components:   Protein, ur 100 (*)    Bacteria, UA RARE (*)    All other components within normal limits  CK  I-STAT CG4 LACTIC ACID, ED  I-STAT CG4 LACTIC ACID, ED    EKG None  Radiology Dg Chest 2 View  Result Date: 08/24/2017 CLINICAL DATA:  Confusion and weakness. EXAM: CHEST - 2 VIEW COMPARISON:  Chest x-ray dated April 02, 2017. FINDINGS: Stable cardiomegaly. Resolved pulmonary edema. No focal consolidation, pleural effusion, or pneumothorax. Stable scarring in the peripheral left lung. No acute osseous  abnormality. IMPRESSION: No active cardiopulmonary disease. Resolved pulmonary edema and small bilateral pleural effusions. Electronically Signed   By: Obie Dredge M.D.   On: 08/24/2017 19:24    Procedures Procedures (including critical care time)  Medications Ordered in ED Medications  nebivolol (BYSTOLIC) tablet 20 mg (20 mg Oral Given 08/24/17 1945)  sodium chloride 0.9 % bolus 1,000 mL (0 mLs Intravenous Stopped 08/24/17 2228)  cloNIDine (CATAPRES) tablet 0.1 mg (0.1 mg Oral Given 08/24/17 1944)  isosorbide-hydrALAZINE (BIDIL) 20-37.5 MG per tablet 1 tablet (1 tablet Oral Given 08/24/17 1944)     Initial Impression / Assessment and Plan / ED Course  I have reviewed the triage vital signs and the nursing notes.  Pertinent labs & imaging results that were available during my care of the patient were reviewed by me and considered in my medical decision making (see chart for details).     84 yo F with a chief complaint of failure to thrive.  The patient has been having difficulty staying care of herself at home.  Her primary caregiver is currently ill and in the hospital.  I found no laboratory reason to admit her into the hospital.  Social work was consulted to evaluate the patient for placement.  The patient's daughter showed up and is willing to take her home.  This is a separate daughter than the typical caretaker.  I had discussed the case with with [facility].  They had noted that the patient had been being taken care of by friends, there is apparently a restraining order filed against him.  They have also been trying to seek placement as an outpatient for some time.  UA without infection.  Lactate normal.  Chest x-ray negative for pneumonia as viewed by me.  She is not anemic, creatinine is mildly worse than baseline. CK normal.   10:46 PM:  I have discussed the diagnosis/risks/treatment options with the patient and family and believe the pt to be eligible for discharge home to  follow-up with PCP. We also discussed returning to the ED immediately if new or worsening sx occur. We discussed the sx which are most concerning (e.g., sudden worsening pain, fever, inability to tolerate by mouth) that necessitate immediate return. Medications administered to the patient during their visit and any new prescriptions provided to the patient are listed below.  Medications given during this visit Medications  nebivolol (BYSTOLIC) tablet 20 mg (20 mg Oral Given 08/24/17 1945)  sodium chloride 0.9 % bolus 1,000 mL (0 mLs Intravenous Stopped 08/24/17 2228)  cloNIDine (CATAPRES) tablet 0.1 mg (0.1 mg Oral Given 08/24/17 1944)  isosorbide-hydrALAZINE (BIDIL) 20-37.5 MG per tablet 1 tablet (1 tablet Oral Given 08/24/17 1944)      The patient appears reasonably screen and/or stabilized for discharge and I doubt any other medical condition or other [facility] requiring further screening, evaluation, or treatment in the ED at this time prior to discharge.    Final Clinical Impressions(s) / ED Diagnoses   Final diagnoses:  Failure to thrive in adult    ED Discharge Orders    None       Melene Plan, DO 08/24/17 2246

## 2017-08-24 NOTE — Progress Notes (Signed)
Consult request has been received. CSW attempting to follow up at present time.  CSW met with EDP who stated pt is likely to return home.  CSW met with pt and pt's daughter [name] who stated she is working to place pt at [facility] as a Medicaid patient.  Pt also has [facility] Medicare.  Per the pt's daughter the pt's daughter is the pt's legal guardian.  Per pt's daughter, pt is "supposed" to be cared for by a female boarder who whom the pt's daughter is in the process of evicting.  Per pt's daughter, the pt's boarder is not caring for the pt sufficiently as the pt was found at home on the floor by EMS, covered in fecal matter.  Per pt's daughter her sister Kermit Balo is the pt's permanent caregiver but Kermit Balo has been admitted into the hospital and is not likely to D/C for at least 2 days.  Per pt's daughter she is worried for her mother's safety and would prefer that her mother go directly to [facility] rather than back to her home and CSW asked the pt's daughter if she had considered taking the pt back home to the pt's daughter's home and then seek admission from the pt's daughters home to the ALF on 08/24/17?Marland Kitchen  Pt's daughter stated that she had already told her husband that this would probably be the plan.  CSW then confirmed with the pt that the pt wanted to D/C ASAP and the EDP was updated.  EDP met with family and the CSW updated the pt's RN.  CSW provided pt's daughter with the contact information for [facility]'s after-hours DSS hotline if the pt's daughter felt that the pt was being taken advantage of or if the pt's daughter ever felt that the pt was in danger and pt's daughter [name] was appreciative and thanked the CSW.   Please reconsult if future social work needs arise.  CSW signing off, as social work intervention is no longer needed.  Alphonse Guild. Jazaria Jarecki, LCSW, LCAS, CSI Clinical Social Worker Ph: [phone]

## 2017-08-28 ENCOUNTER — Encounter: Payer: Self-pay | Admitting: Nurse Practitioner

## 2017-08-30 ENCOUNTER — Ambulatory Visit (INDEPENDENT_AMBULATORY_CARE_PROVIDER_SITE_OTHER): Payer: Medicare Other

## 2017-08-30 ENCOUNTER — Other Ambulatory Visit: Payer: Medicare Other

## 2017-08-30 DIAGNOSIS — Z111 Encounter for screening for respiratory tuberculosis: Secondary | ICD-10-CM | POA: Diagnosis not present

## 2017-09-01 LAB — TB SKIN TEST
Induration: 0 mm
TB Skin Test: NEGATIVE

## 2017-11-08 ENCOUNTER — Encounter: Payer: Self-pay | Admitting: Internal Medicine

## 2018-01-03 ENCOUNTER — Emergency Department (HOSPITAL_COMMUNITY)

## 2018-01-03 ENCOUNTER — Other Ambulatory Visit: Payer: Self-pay

## 2018-01-03 ENCOUNTER — Inpatient Hospital Stay (HOSPITAL_COMMUNITY)
Admission: EM | Admit: 2018-01-03 | Discharge: 2018-01-05 | DRG: 291 | Disposition: A | Discharge status: Other Acute Inpatient Hospital | Source: Skilled Nursing Facility | Attending: Internal Medicine | Admitting: Internal Medicine

## 2018-01-03 ENCOUNTER — Encounter (HOSPITAL_COMMUNITY): Payer: Self-pay | Admitting: Emergency Medicine

## 2018-01-03 DIAGNOSIS — S0990XA Unspecified injury of head, initial encounter: Secondary | ICD-10-CM

## 2018-01-03 DIAGNOSIS — Z8701 Personal history of pneumonia (recurrent): Secondary | ICD-10-CM

## 2018-01-03 DIAGNOSIS — Z91048 Other nonmedicinal substance allergy status: Secondary | ICD-10-CM

## 2018-01-03 DIAGNOSIS — I5043 Acute on chronic combined systolic (congestive) and diastolic (congestive) heart failure: Secondary | ICD-10-CM | POA: Diagnosis present

## 2018-01-03 DIAGNOSIS — I509 Heart failure, unspecified: Secondary | ICD-10-CM

## 2018-01-03 DIAGNOSIS — Z86718 Personal history of other venous thrombosis and embolism: Secondary | ICD-10-CM

## 2018-01-03 DIAGNOSIS — Z955 Presence of coronary angioplasty implant and graft: Secondary | ICD-10-CM

## 2018-01-03 DIAGNOSIS — J189 Pneumonia, unspecified organism: Secondary | ICD-10-CM | POA: Diagnosis present

## 2018-01-03 DIAGNOSIS — N184 Chronic kidney disease, stage 4 (severe): Secondary | ICD-10-CM | POA: Diagnosis present

## 2018-01-03 DIAGNOSIS — I5023 Acute on chronic systolic (congestive) heart failure: Secondary | ICD-10-CM | POA: Diagnosis not present

## 2018-01-03 DIAGNOSIS — Z66 Do not resuscitate: Secondary | ICD-10-CM | POA: Diagnosis present

## 2018-01-03 DIAGNOSIS — J181 Lobar pneumonia, unspecified organism: Secondary | ICD-10-CM | POA: Diagnosis present

## 2018-01-03 DIAGNOSIS — Z882 Allergy status to sulfonamides status: Secondary | ICD-10-CM

## 2018-01-03 DIAGNOSIS — R630 Anorexia: Secondary | ICD-10-CM | POA: Diagnosis present

## 2018-01-03 DIAGNOSIS — I252 Old myocardial infarction: Secondary | ICD-10-CM

## 2018-01-03 DIAGNOSIS — Z79899 Other long term (current) drug therapy: Secondary | ICD-10-CM

## 2018-01-03 DIAGNOSIS — Z7982 Long term (current) use of aspirin: Secondary | ICD-10-CM

## 2018-01-03 DIAGNOSIS — I130 Hypertensive heart and chronic kidney disease with heart failure and stage 1 through stage 4 chronic kidney disease, or unspecified chronic kidney disease: Secondary | ICD-10-CM | POA: Diagnosis not present

## 2018-01-03 DIAGNOSIS — R680 Hypothermia, not associated with low environmental temperature: Secondary | ICD-10-CM | POA: Diagnosis not present

## 2018-01-03 DIAGNOSIS — S51011A Laceration without foreign body of right elbow, initial encounter: Secondary | ICD-10-CM

## 2018-01-03 DIAGNOSIS — N183 Chronic kidney disease, stage 3 unspecified: Secondary | ICD-10-CM | POA: Diagnosis present

## 2018-01-03 DIAGNOSIS — I493 Ventricular premature depolarization: Secondary | ICD-10-CM | POA: Diagnosis present

## 2018-01-03 DIAGNOSIS — I272 Pulmonary hypertension, unspecified: Secondary | ICD-10-CM | POA: Diagnosis present

## 2018-01-03 DIAGNOSIS — Z87891 Personal history of nicotine dependence: Secondary | ICD-10-CM

## 2018-01-03 DIAGNOSIS — Z880 Allergy status to penicillin: Secondary | ICD-10-CM

## 2018-01-03 DIAGNOSIS — R4701 Aphasia: Secondary | ICD-10-CM | POA: Diagnosis present

## 2018-01-03 DIAGNOSIS — I447 Left bundle-branch block, unspecified: Secondary | ICD-10-CM | POA: Diagnosis present

## 2018-01-03 DIAGNOSIS — I255 Ischemic cardiomyopathy: Secondary | ICD-10-CM | POA: Diagnosis present

## 2018-01-03 DIAGNOSIS — F0390 Unspecified dementia without behavioral disturbance: Secondary | ICD-10-CM | POA: Diagnosis present

## 2018-01-03 DIAGNOSIS — Z515 Encounter for palliative care: Secondary | ICD-10-CM | POA: Diagnosis present

## 2018-01-03 DIAGNOSIS — S00211A Abrasion of right eyelid and periocular area, initial encounter: Secondary | ICD-10-CM | POA: Diagnosis present

## 2018-01-03 DIAGNOSIS — Z8673 Personal history of transient ischemic attack (TIA), and cerebral infarction without residual deficits: Secondary | ICD-10-CM

## 2018-01-03 DIAGNOSIS — Z888 Allergy status to other drugs, medicaments and biological substances status: Secondary | ICD-10-CM

## 2018-01-03 DIAGNOSIS — Y92129 Unspecified place in nursing home as the place of occurrence of the external cause: Secondary | ICD-10-CM

## 2018-01-03 DIAGNOSIS — W19XXXA Unspecified fall, initial encounter: Secondary | ICD-10-CM | POA: Diagnosis present

## 2018-01-03 DIAGNOSIS — Z885 Allergy status to narcotic agent status: Secondary | ICD-10-CM

## 2018-01-03 DIAGNOSIS — I2510 Atherosclerotic heart disease of native coronary artery without angina pectoris: Secondary | ICD-10-CM | POA: Diagnosis present

## 2018-01-03 DIAGNOSIS — Z833 Family history of diabetes mellitus: Secondary | ICD-10-CM

## 2018-01-03 DIAGNOSIS — Z23 Encounter for immunization: Secondary | ICD-10-CM

## 2018-01-03 DIAGNOSIS — E1122 Type 2 diabetes mellitus with diabetic chronic kidney disease: Secondary | ICD-10-CM | POA: Diagnosis present

## 2018-01-03 DIAGNOSIS — Z681 Body mass index (BMI) 19 or less, adult: Secondary | ICD-10-CM

## 2018-01-03 DIAGNOSIS — E785 Hyperlipidemia, unspecified: Secondary | ICD-10-CM | POA: Diagnosis present

## 2018-01-03 DIAGNOSIS — J9601 Acute respiratory failure with hypoxia: Secondary | ICD-10-CM | POA: Diagnosis present

## 2018-01-03 LAB — CBC WITH DIFFERENTIAL/PLATELET
Abs Immature Granulocytes: 0.06 10*3/uL (ref 0.00–0.07)
Basophils Absolute: 0 10*3/uL (ref 0.0–0.1)
Basophils Relative: 0 %
EOS ABS: 0.1 10*3/uL (ref 0.0–0.5)
EOS PCT: 1 %
HEMATOCRIT: 32 % — AB (ref 36.0–46.0)
HEMOGLOBIN: 9.9 g/dL — AB (ref 12.0–15.0)
Immature Granulocytes: 1 %
LYMPHS ABS: 1 10*3/uL (ref 0.7–4.0)
LYMPHS PCT: 11 %
MCH: 29.9 pg (ref 26.0–34.0)
MCHC: 30.9 g/dL (ref 30.0–36.0)
MCV: 96.7 fL (ref 80.0–100.0)
MONO ABS: 0.6 10*3/uL (ref 0.1–1.0)
Monocytes Relative: 6 %
Neutro Abs: 7.6 10*3/uL (ref 1.7–7.7)
Neutrophils Relative %: 81 %
Platelets: 221 10*3/uL (ref 150–400)
RBC: 3.31 MIL/uL — ABNORMAL LOW (ref 3.87–5.11)
RDW: 13.3 % (ref 11.5–15.5)
WBC: 9.4 10*3/uL (ref 4.0–10.5)
nRBC: 0 % (ref 0.0–0.2)

## 2018-01-03 LAB — GLUCOSE, CAPILLARY
Glucose-Capillary: 197 mg/dL — ABNORMAL HIGH (ref 70–99)
Glucose-Capillary: 193 mg/dL — ABNORMAL HIGH (ref 70–99)
Glucose-Capillary: 79 mg/dL (ref 70–99)
Glucose-Capillary: 126 mg/dL — ABNORMAL HIGH (ref 70–99)

## 2018-01-03 LAB — BASIC METABOLIC PANEL
Anion gap: 11 (ref 5–15)
BUN: 47 mg/dL — ABNORMAL HIGH (ref 8–23)
CALCIUM: 9 mg/dL (ref 8.9–10.3)
CHLORIDE: 109 mmol/L (ref 98–111)
CO2: 23 mmol/L (ref 22–32)
CREATININE: 1.9 mg/dL — AB (ref 0.44–1.00)
GFR calc non Af Amer: 22 mL/min — ABNORMAL LOW (ref >60)
GFR, EST AFRICAN AMERICAN: 26 mL/min — AB (ref >60)
GLUCOSE: 165 mg/dL — AB (ref 70–99)
Potassium: 4 mmol/L (ref 3.5–5.1)
Sodium: 143 mmol/L (ref 135–145)

## 2018-01-03 LAB — TROPONIN I
TROPONIN I: 0.05 ng/mL — AB (ref <0.03)
Troponin I: 0.04 ng/mL (ref <0.03)
Troponin I: 0.04 ng/mL (ref <0.03)

## 2018-01-03 LAB — BRAIN NATRIURETIC PEPTIDE: B Natriuretic Peptide: 1942.9 pg/mL — ABNORMAL HIGH (ref 0.0–100.0)

## 2018-01-03 LAB — MRSA PCR SCREENING: MRSA by PCR: NEGATIVE

## 2018-01-03 LAB — STREP PNEUMONIAE URINARY ANTIGEN: STREP PNEUMO URINARY ANTIGEN: NEGATIVE

## 2018-01-03 MED ORDER — FUROSEMIDE 10 MG/ML IJ SOLN
40.0000 mg | Freq: Two times a day (BID) | INTRAMUSCULAR | Status: DC
  Administered 2018-01-03 – 2018-01-05 (×4): 40 mg via INTRAVENOUS
  Filled 2018-01-03 (×4): qty 4

## 2018-01-03 MED ORDER — SODIUM CHLORIDE 0.9 % IV SOLN
100.0000 mg | Freq: Two times a day (BID) | INTRAVENOUS | Status: DC
  Administered 2018-01-03 – 2018-01-05 (×5): 100 mg via INTRAVENOUS
  Filled 2018-01-03 (×6): qty 100

## 2018-01-03 MED ORDER — QUETIAPINE FUMARATE 25 MG PO TABS
25.0000 mg | ORAL_TABLET | Freq: Every day | ORAL | Status: DC
  Administered 2018-01-03 – 2018-01-04 (×2): 25 mg via ORAL
  Filled 2018-01-03 (×2): qty 1

## 2018-01-03 MED ORDER — LORAZEPAM 2 MG/ML IJ SOLN
0.5000 mg | INTRAMUSCULAR | Status: DC | PRN
  Administered 2018-01-03 – 2018-01-04 (×2): 0.5 mg via INTRAVENOUS
  Filled 2018-01-03 (×2): qty 1

## 2018-01-03 MED ORDER — CLONIDINE HCL 0.1 MG PO TABS
0.1000 mg | ORAL_TABLET | Freq: Two times a day (BID) | ORAL | Status: DC
  Administered 2018-01-03 – 2018-01-05 (×4): 0.1 mg via ORAL
  Filled 2018-01-03 (×5): qty 1

## 2018-01-03 MED ORDER — HALOPERIDOL LACTATE 2 MG/ML PO CONC
5.0000 mg | Freq: Every day | ORAL | Status: DC | PRN
  Administered 2018-01-03: 5 mg via ORAL
  Filled 2018-01-03 (×3): qty 2.5

## 2018-01-03 MED ORDER — TETANUS-DIPHTH-ACELL PERTUSSIS 5-2.5-18.5 LF-MCG/0.5 IM SUSP
0.5000 mL | Freq: Once | INTRAMUSCULAR | Status: AC
  Administered 2018-01-03: 0.5 mL via INTRAMUSCULAR
  Filled 2018-01-03 (×2): qty 0.5

## 2018-01-03 MED ORDER — FUROSEMIDE 10 MG/ML IJ SOLN
40.0000 mg | Freq: Once | INTRAMUSCULAR | Status: AC
  Administered 2018-01-03: 40 mg via INTRAVENOUS
  Filled 2018-01-03: qty 4

## 2018-01-03 MED ORDER — OXYCODONE HCL 5 MG PO TABS
5.0000 mg | ORAL_TABLET | ORAL | Status: DC | PRN
  Administered 2018-01-04: 5 mg via ORAL
  Filled 2018-01-03: qty 1

## 2018-01-03 MED ORDER — INSULIN ASPART 100 UNIT/ML ~~LOC~~ SOLN
0.0000 [IU] | Freq: Three times a day (TID) | SUBCUTANEOUS | Status: DC
  Administered 2018-01-03 (×2): 2 [IU] via SUBCUTANEOUS
  Administered 2018-01-04: 1 [IU] via SUBCUTANEOUS
  Administered 2018-01-04: 3 [IU] via SUBCUTANEOUS
  Administered 2018-01-05: 1 [IU] via SUBCUTANEOUS
  Administered 2018-01-05: 2 [IU] via SUBCUTANEOUS

## 2018-01-03 MED ORDER — POLYETHYLENE GLYCOL 3350 17 GM/SCOOP PO POWD: 17.0000 g | Freq: Every day | ORAL | Status: DC

## 2018-01-03 MED ORDER — NEBIVOLOL HCL 10 MG PO TABS
10.0000 mg | ORAL_TABLET | Freq: Every day | ORAL | Status: DC
  Administered 2018-01-03 – 2018-01-05 (×2): 10 mg via ORAL
  Filled 2018-01-03 (×3): qty 1

## 2018-01-03 MED ORDER — HEPARIN SODIUM (PORCINE) 5000 UNIT/ML IJ SOLN
5000.0000 [IU] | Freq: Three times a day (TID) | INTRAMUSCULAR | Status: DC
  Administered 2018-01-03 – 2018-01-05 (×7): 5000 [IU] via SUBCUTANEOUS
  Filled 2018-01-03 (×7): qty 1

## 2018-01-03 MED ORDER — POLYETHYLENE GLYCOL 3350 17 G PO PACK
17.0000 g | PACK | Freq: Every day | ORAL | Status: DC
  Administered 2018-01-04 – 2018-01-05 (×2): 17 g via ORAL
  Filled 2018-01-03 (×2): qty 1

## 2018-01-03 MED ORDER — HYDRALAZINE HCL 20 MG/ML IJ SOLN: 10.0000 mg | Freq: Four times a day (QID) | INTRAMUSCULAR | Status: DC | PRN

## 2018-01-03 MED ORDER — ISOSORB DINITRATE-HYDRALAZINE 20-37.5 MG PO TABS
1.0000 | ORAL_TABLET | Freq: Two times a day (BID) | ORAL | Status: DC
  Administered 2018-01-03 – 2018-01-05 (×5): 1 via ORAL
  Filled 2018-01-03 (×5): qty 1

## 2018-01-03 NOTE — Progress Notes (Signed)
Pt. Had 6 beat run of v tach.

## 2018-01-03 NOTE — ED Notes (Signed)
Patient transported to X-ray 

## 2018-01-03 NOTE — ED Notes (Signed)
Bed: [facility] Expected date:  Expected time:  Means of arrival:  Comments: EMS 84 yo female from [facility] unwitnessed face-abrasion head-skin tear arm/dementia patient

## 2018-01-03 NOTE — H&P (Signed)
History and Physical    [name] ZOX:[id number] DOB: Nov 20, 1927 DOA: 01/03/2018  I have briefly reviewed the patient's prior medical records in [facility]  PCP: Kirt Boys, DO  Patient coming from: Memory care unit  Chief Complaint: Fall  HPI: [name] is a 84 y.o. female with medical history significant of dementia, chronic kidney disease stage IV, coronary artery disease, chronic diastolic CHF, hypertension, hyperlipidemia, who comes to the hospital with a chief complaint of fall.  Patient has underlying dementia and cannot contribute to the story so HPI is per EDP as well as talking to the patient's daughter over the phone.  She lives in [place] nursing home and staff found patient on the floor with pants around her ankles.  She was found to be hypoxic and was brought to the ED.  Daughter tells me that last she saw her mom was yesterday, and her breathing seemed a little bit labored but patient herself did not have any complaints.  No reported fever or chills.  ED Course: In the emergency room blood work is remarkable for creatinine of 1.9, which is at her baseline, BNP is elevated to 1900, and a chest x-ray showed patchy consolidation of the right lung base possible pneumonia versus atelectasis, as well as interstitial edema and cardiomegaly.  She was given a dose of IV Lasix and we were asked to admit for hypoxia  Review of Systems: Unable to obtain review of systems due to underlying dementia  Past Medical History:  Diagnosis Date  . Anemia   . Anxiety disorder   . Carotid artery occlusion   . Carotid bruit   . Chronic kidney disease (CKD), stage III (moderate) (HCC)   . Coronary artery disease    Remote PCI. Had BMS to RCA and DES stent to Diagonal in Jan. 2012   . Diastolic heart failure    EF 55 to 60% per cath in Jan 2012  . DVT (deep venous thrombosis) (HCC)   . Gout   . Hyperlipidemia   . Hypertension   . Ischemic cardiomyopathy    with prior  EF of 35%  . NSTEMI (non-ST elevated myocardial infarction) [facility]) Jan 2012   with PCI to RCA and DX per Dr. Excell Seltzer  . Obesity   . Pneumonia 2016  . Proteinuria   . Stroke [facility]) 2013   ?  mini    . TIA (transient ischemic attack)   . Type II diabetes mellitus (HCC)    long standing    Past Surgical History:  Procedure Laterality Date  . ABDOMINAL AORTAGRAM N/A 01/17/2014   Procedure: ABDOMINAL Ronny Flurry;  Surgeon: Sherren Kerns, MD;  Location: [facility] CATH LAB;  Service: Cardiovascular;  Laterality: N/A;  . ANKLE FUSION Bilateral   . APPENDECTOMY    . CATARACT EXTRACTION, BILATERAL Bilateral   . CORONARY ANGIOPLASTY WITH STENT PLACEMENT  04/05/2010   distal RCA  . FRACTURE SURGERY Right 2005   bilateral ankles   . TONSILLECTOMY       reports that she has quit smoking. She has never used smokeless tobacco. She reports that she does not drink alcohol or use drugs.  Allergies  Allergen Reactions  . Namzaric [Memantine Hcl-Donepezil Hcl] Nausea Only and Other (See Comments)    confusion  . Percocet [Oxycodone-Acetaminophen] Other (See Comments)    loosy goosy  . Sulfa Antibiotics Other (See Comments)    Tongue swells  . Adhesive [Tape] Other (See Comments)    Tears  skin.  Please use "paper" tape  . Metformin And Related Diarrhea and Other (See Comments)    Abnormal Kidney Functions   . Tradjenta [Linagliptin] Other (See Comments)    Low back ache, resolved once medication stopped   . Penicillins Hives and Other (See Comments)    Tolerates Ceftriaxone, Has patient had a PCN reaction causing immediate rash, facial/tongue/throat swelling, SOB or lightheadedness with hypotension: Unknown Has patient had a PCN reaction causing severe rash involving mucus membranes or skin necrosis: No Has patient had a PCN reaction that required hospitalization No Has patient had a PCN reaction occurring within the last 10 years: No If all of the above answers are "NO", then may proceed with  Cephalosporin use.     Family History  Problem Relation Age of Onset  . Diabetes Daughter   . CAD Neg Hx     Prior to Admission medications   Medication Sig Start Date End Date Taking? Authorizing Provider  BIDIL 20-37.5 MG tablet TAKE 1 TABLET BY MOUTH TWICE DAILY 05/03/17  Yes Kirt Boys, DO  cloNIDine (CATAPRES) 0.1 MG tablet Take 1 tablet (0.1 mg total) by mouth 2 (two) times daily. 03/22/17  Yes Marinda Elk, MD  furosemide (LASIX) 40 MG tablet Take 40 mg by mouth daily.   Yes [provider]  haloperidol (HALDOL) 2 MG/ML solution Take 5 mg by mouth daily.   Yes [provider]  hydrocortisone (PROCTOZONE-HC) 2.5 % rectal cream APPLY TO RECTUM 3 TIMES DAILY FOR DISCOMFORT 08/02/17  Yes Deiter, Monica, DO  LOTEMAX 0.5 % GEL PLACE 1 DROP IN THE LEFT EYE TWICE DAILY Patient taking differently: Place 1 drop into the left eye 2 (two) times daily.  03/24/17  Yes Montez Morita, Monica, DO  nebivolol (BYSTOLIC) 10 MG tablet Take 10 mg by mouth daily.   Yes [provider]  nitroGLYCERIN (NITROSTAT) 0.4 MG SL tablet Place one under tongue for chest pain, up to 3 tablets 03/24/17  Yes Kirt Boys, DO  oxyCODONE (OXY IR/ROXICODONE) 5 MG immediate release tablet Take 5 mg by mouth every 4 (four) hours as needed for severe pain.   Yes [provider]  polyethylene glycol powder (GLYCOLAX/MIRALAX) powder Take 17 g by mouth daily.  01/16/17  Yes [provider]  timolol (BETIMOL) 0.5 % ophthalmic solution INSTILL 1 DROP INTO THE LEFT EYE DAILY 03/24/17  Yes Kirt Boys, DO  aspirin EC 81 MG tablet Take 1 tablet (81 mg total) by mouth daily. Patient not taking: Reported on 01/03/2018 12/11/16   Albertine Grates, MD  BYSTOLIC 20 MG TABS TAKE 1 TABLET BY MOUTH EVERY DAY FOR BLOOD PRESSURE Patient not taking: Reported on 01/03/2018 07/28/17   Kirt Boys, DO  cephALEXin (KEFLEX) 500 MG capsule Take 1 capsule (500 mg total) by mouth 4 (four) times daily. Patient  not taking: Reported on 08/24/2017 05/03/17   Molpus, Jonny Ruiz, MD  ciprofloxacin (CIPRO) 250 MG tablet Take 1 tablet (250 mg total) by mouth 2 (two) times daily. Patient not taking: Reported on 08/24/2017 05/05/17   Sharon Seller, NP  docusate sodium (COLACE) 100 MG capsule Take 1 capsule (100 mg total) by mouth every 12 (twelve) hours. Patient not taking: Reported on 01/03/2018 01/09/17   Ward, Layla Maw, DO  ezetimibe-simvastatin (VYTORIN) 10-20 MG tablet TAKE 1/2 TABLET BY MOUTH ONCE NIGHTLY TO CONTROL CHOLESTEROL Patient not taking: Reported on 01/03/2018 04/13/17   Kirt Boys, DO  feeding supplement, ENSURE ENLIVE, (ENSURE ENLIVE) LIQD Take 237 mLs by  mouth 2 (two) times daily between meals. Patient not taking: Reported on 01/03/2018 12/11/16   Albertine Grates, MD  furosemide (LASIX) 20 MG tablet TAKE ONE TABLET BY MOUTH TWICE DAILY FOR HYPERTENSION Patient not taking: Reported on 01/03/2018 05/10/17   Kirt Boys, DO  OLANZapine zydis (ZYPREXA) 5 MG disintegrating tablet Take 1 tablet (5 mg total) by mouth at bedtime. Patient not taking: Reported on 08/24/2017 04/07/17   Darlin Drop, DO    Physical Exam: Vitals:   01/03/18 0239 01/03/18 0253 01/03/18 0530  BP:  (!) 165/90 (!) 195/84  Pulse:  69 67  Resp:  18 (!) 34  Temp:  98.4 F (36.9 C)   TempSrc:  Oral   SpO2: 98% 92% 93%    Constitutional: NAD, calm, comfortable Eyes: PERRL, lids and conjunctivae normal ENMT: Mucous membranes are moist.  Neck: normal, supple Respiratory: Faint bibasilar crackles without wheezing.  Normal respiratory effort. Cardiovascular: Regular rate and rhythm, soft SEM.  Trace extremity edema. 2+ pedal pulses.  Abdomen: no tenderness, no masses palpated. Bowel sounds positive.  Musculoskeletal: no clubbing / cyanosis. Normal muscle tone.  Skin: no rashes, lesions, ulcers. No induration Neurologic: CN 2-12 grossly intact. Strength 5/5 in all 4.  Psychiatric:  Alert to self only.  Labs on Admission: I  have personally reviewed following labs and imaging studies  CBC: Recent Labs  Lab 01/03/18 0353  WBC 9.4  NEUTROABS 7.6  HGB 9.9*  HCT 32.0*  MCV 96.7  PLT 221   Basic Metabolic Panel: Recent Labs  Lab 01/03/18 0353  NA 143  K 4.0  CL 109  CO2 23  GLUCOSE 165*  BUN 47*  CREATININE 1.90*  CALCIUM 9.0   GFR: CrCl cannot be calculated (Unknown ideal weight.). Liver Function Tests: No results for input(s): AST, ALT, ALKPHOS, BILITOT, PROT, ALBUMIN in the last 168 hours. No results for input(s): LIPASE, AMYLASE in the last 168 hours. No results for input(s): AMMONIA in the last 168 hours. Coagulation Profile: No results for input(s): INR, PROTIME in the last 168 hours. Cardiac Enzymes: Recent Labs  Lab 01/03/18 0353  TROPONINI 0.05*   BNP (last 3 results) No results for input(s): PROBNP in the last 8760 hours. HbA1C: No results for input(s): HGBA1C in the last 72 hours. CBG: No results for input(s): GLUCAP in the last 168 hours. Lipid Profile: No results for input(s): CHOL, HDL, LDLCALC, TRIG, CHOLHDL, LDLDIRECT in the last 72 hours. Thyroid Function Tests: No results for input(s): TSH, T4TOTAL, FREET4, T3FREE, THYROIDAB in the last 72 hours. Anemia Panel: No results for input(s): VITAMINB12, FOLATE, FERRITIN, TIBC, IRON, RETICCTPCT in the last 72 hours. Urine analysis:    Component Value Date/Time   COLORURINE YELLOW 08/24/2017 2026   APPEARANCEUR CLEAR 08/24/2017 2026   APPEARANCEUR Clear 02/21/2013 1622   LABSPEC 1.008 08/24/2017 2026   PHURINE 6.0 08/24/2017 2026   GLUCOSEU NEGATIVE 08/24/2017 2026   HGBUR NEGATIVE 08/24/2017 2026   BILIRUBINUR NEGATIVE 08/24/2017 2026   BILIRUBINUR Negative 02/21/2013 1622   KETONESUR NEGATIVE 08/24/2017 2026   PROTEINUR 100 (A) 08/24/2017 2026   UROBILINOGEN 0.2 12/23/2014 1513   NITRITE NEGATIVE 08/24/2017 2026   LEUKOCYTESUR NEGATIVE 08/24/2017 2026   LEUKOCYTESUR Negative 02/21/2013 1622     Radiological  Exams on Admission: Dg Chest 2 View  Result Date: 01/03/2018 CLINICAL DATA:  84 y/o F; fall at nursing home this a.m., shortness of breath, abrasion to olecranon area of the elbow. EXAM: CHEST - 2 VIEW COMPARISON:  08/24/2017  chest radiograph FINDINGS: Stable cardiomegaly given projection and technique. Aortic atherosclerosis with calcification. Reticular opacities of the lungs. Patchy consolidation in the right lung base. No pleural effusion or pneumothorax. Chronic appearing right anterolateral rib fractures. No acute osseous abnormality is evident. IMPRESSION: 1. Patchy consolidation in the right lung base may represent pneumonia or atelectasis. 2. Reticular opacities of the lungs probably representing interstitial edema. 3. Stable cardiomegaly. 4.  Aortic Atherosclerosis (ICD10-I70.0). Electronically Signed   By: Mitzi Hansen M.D.   On: 01/03/2018 03:47   Dg Elbow Complete Right  Result Date: 01/03/2018 CLINICAL DATA:  Patient fell in nursing home this morning. Shortness of breath. Abrasion to the olecranon area. EXAM: RIGHT ELBOW - COMPLETE 3+ VIEW COMPARISON:  None. FINDINGS: Diffuse bone demineralization. No significant effusion. No evidence of acute fracture or subluxation. No focal bone lesion or bone destruction. Bone cortex and trabecular architecture appear intact. No radiopaque soft tissue foreign bodies. IMPRESSION: No acute bony abnormalities. Electronically Signed   By: Burman Nieves M.D.   On: 01/03/2018 03:37   Ct Head Wo Contrast  Result Date: 01/03/2018 CLINICAL DATA:  Fall with laceration to the right eyebrow EXAM: CT HEAD WITHOUT CONTRAST CT CERVICAL SPINE WITHOUT CONTRAST TECHNIQUE: Multidetector CT imaging of the head and cervical spine was performed following the standard protocol without intravenous contrast. Multiplanar CT image reconstructions of the cervical spine were also generated. COMPARISON:  CT brain 06/12/2015, MRI brain 09/05/2009 FINDINGS: CT HEAD  FINDINGS Brain: No acute territorial infarction, hemorrhage or intracranial mass is visualized. Atrophy and small vessel ischemic changes of the white matter. Stable prominent ventricles. Vascular: No hyperdense vessels. Vertebral and carotid vascular calcification Skull: Normal. Negative for fracture or focal lesion. Sinuses/Orbits: No acute finding. Postsurgical changes of the anterior maxillary sinuses. Other: None CT CERVICAL SPINE FINDINGS Alignment: No subluxation.  Facet alignment within normal limits. Skull base and vertebrae: No acute fracture. No primary bone lesion or focal pathologic process. Soft tissues and spinal canal: No prevertebral fluid or swelling. No visible canal hematoma. Disc levels: Mild degenerative changes at C4-C5, C5-C6, C6-C7. Normal facet degenerative change. Upper chest: Apical emphysema. Other: Plan IMPRESSION: 1. No CT evidence for acute intracranial abnormality. Atrophy and small vessel ischemic changes the matter 2. No acute osseous abnormality of the cervical spine Evidence for acute intracranial abnormality. Atrophy and small vessel ischemic changes of the white matter Electronically Signed   By: Jasmine Pang M.D.   On: 01/03/2018 03:54   Ct Cervical Spine Wo Contrast  Result Date: 01/03/2018 CLINICAL DATA:  Fall with laceration to the right eyebrow EXAM: CT HEAD WITHOUT CONTRAST CT CERVICAL SPINE WITHOUT CONTRAST TECHNIQUE: Multidetector CT imaging of the head and cervical spine was performed following the standard protocol without intravenous contrast. Multiplanar CT image reconstructions of the cervical spine were also generated. COMPARISON:  CT brain 06/12/2015, MRI brain 09/05/2009 FINDINGS: CT HEAD FINDINGS Brain: No acute territorial infarction, hemorrhage or intracranial mass is visualized. Atrophy and small vessel ischemic changes of the white matter. Stable prominent ventricles. Vascular: No hyperdense vessels. Vertebral and carotid vascular calcification Skull:  Normal. Negative for fracture or focal lesion. Sinuses/Orbits: No acute finding. Postsurgical changes of the anterior maxillary sinuses. Other: None CT CERVICAL SPINE FINDINGS Alignment: No subluxation.  Facet alignment within normal limits. Skull base and vertebrae: No acute fracture. No primary bone lesion or focal pathologic process. Soft tissues and spinal canal: No prevertebral fluid or swelling. No visible canal hematoma. Disc levels: Mild degenerative changes at C4-C5, C5-C6,  C6-C7. Normal facet degenerative change. Upper chest: Apical emphysema. Other: Plan IMPRESSION: 1. No CT evidence for acute intracranial abnormality. Atrophy and small vessel ischemic changes the matter 2. No acute osseous abnormality of the cervical spine Evidence for acute intracranial abnormality. Atrophy and small vessel ischemic changes of the white matter Electronically Signed   By: Jasmine Pang M.D.   On: 01/03/2018 03:54    EKG: Independently reviewed. LBBB  Assessment/Plan Active Problems:   Pneumonia   Acute hypoxic respiratory failure in the setting of acute on chronic combined CHF versus possible pneumonia -Patient with evidence of fluid overload, will admit to the hospital for IV diuresis, most recent 2D echo in January 2019 showed an EF of 40 to 45%, as well as mild pulmonary hypertension.  Will not repeat a 2D echo -Also I will satrt on IV antibiotics although pneumonia is less likely, monitor cultures and consider de-escalating rapidly, she is unreliable in terms of her symptoms and cannot really rule out at this point  Chronic kidney disease stage IV -Creatinine seems to be at baseline (baseline around 2, 1.9 on admission), closely monitor while getting IV Lasix  Hypertension -Continue home medications  Hyperlipidemia -Continue statin  Dementia -Continue home medications, given possible pneumonia will obtain speech eval  Diabetes mellitus -Most recent A1c 6.5, placed on sliding scale   DVT  prophylaxis: heparin  Code Status: DNR  Family Communication: daughter [name] over the phone Disposition Plan: Back to memory care unit 24-48 hours Consults called: None    Admission status: Observation  At the point of initial evaluation, it is my clinical opinion that admission for OBSERVATION is reasonable and necessary because the patient's presenting complaints in the context of their chronic conditions represent sufficient risk of deterioration or significant morbidity to constitute reasonable grounds for close observation in the hospital setting, but that the patient may be medically stable for discharge from the hospital within 24 to 48 hours.    Pamella Pert, MD [facility] Pager [phone]  If 7PM-7AM, please contact night-coverage [email or web address] Password [email or web address]  01/03/2018, 7:14 AM

## 2018-01-03 NOTE — Progress Notes (Signed)
Pt. Actively trying to get out of bed, she believes she has to get back home to her daughters. She is screaming "help." Haldol given without relief. Not able to redirect. MD made aware. Due to high risk of falling and harming self. This RN at bedside and Recruitment consultant ordered. AC and Charge RN made aware.

## 2018-01-03 NOTE — ED Triage Notes (Signed)
Ptt from [facility] ECF sent to ED after fall from standing to tile floor causing abrasion right elbow and small laceration to right eyebrow bleeding controled w/o drsg.

## 2018-01-03 NOTE — Evaluation (Signed)
SLP Cancellation Note  Patient Details Name: [name] MRN: [id number] DOB: 12/28/1927   Cancelled treatment:       Reason Eval/Treat Not Completed: Other (comment);Fatigue/lethargy limiting ability to participate(pt lethargic and unable to have po at this time)   [name] 01/03/2018, 12:22 PM   Donavan Burnet, MS [facility] SLP Acute Rehab Services Pager [phone] Office [phone]

## 2018-01-03 NOTE — Evaluation (Signed)
Clinical/Bedside Swallow Evaluation Patient Details  Name: ROCQUEL Walters MRN: [id number] Date of Birth: [birth year]  Today's Date: 01/03/2018 Time: SLP Start Time (ACUTE ONLY): 1630 SLP Stop Time (ACUTE ONLY): 1645 SLP Time Calculation (min) (ACUTE ONLY): 15 min  Past Medical History:  Past Medical History:  Diagnosis Date  . Anemia   . Anxiety disorder   . Carotid artery occlusion   . Carotid bruit   . Chronic kidney disease (CKD), stage III (moderate) (HCC)   . Coronary artery disease    Remote PCI. Had BMS to RCA and DES stent to Diagonal in Jan. 2012   . Diastolic heart failure    EF 55 to 60% per cath in Jan 2012  . DVT (deep venous thrombosis) (HCC)   . Gout   . Hyperlipidemia   . Hypertension   . Ischemic cardiomyopathy    with prior EF of 35%  . NSTEMI (non-ST elevated myocardial infarction) [facility]) Jan 2012   with PCI to RCA and DX per Dr. Excell Seltzer  . Obesity   . Pneumonia 2016  . Proteinuria   . Stroke [facility]) 2013   ?  mini    . TIA (transient ischemic attack)   . Type II diabetes mellitus (HCC)    long standing   Past Surgical History:  Past Surgical History:  Procedure Laterality Date  . ABDOMINAL AORTAGRAM N/A 01/17/2014   Procedure: ABDOMINAL Ronny Flurry;  Surgeon: Sherren Kerns, MD;  Location: [facility] CATH LAB;  Service: Cardiovascular;  Laterality: N/A;  . ANKLE FUSION Bilateral   . APPENDECTOMY    . CATARACT EXTRACTION, BILATERAL Bilateral   . CORONARY ANGIOPLASTY WITH STENT PLACEMENT  04/05/2010   distal RCA  . FRACTURE SURGERY Right 2005   bilateral ankles   . TONSILLECTOMY     HPI:  84 yo female who stays at [facility] admit to [facility] after fall.  Pt with negative CT head, + facial laceration.  CXR showed concerns for CHF, pt was given Lasix and ABX.  Swallow evaluation ordered.  PMH + for dementia, HTN, HLD, DM2, mini stroke.  Family reports pt does not have dysphagia but they do "chop up her food".  Swallow evaluation ordered.  CXR  indicating patchy right lower lobe opacity - infiltrate vs atelectasis.  Pt was lethargic earlier and unable to eat.   Nurse tech reports pt fed herself without difficulties this am for breakfast.  Daughter states pt has oxgyen to use prn at her facility but she does not use it.  Pt is seen by hospice workers once a week and oxygen saturations are adequate per daughter.  Daughter also states pt sleeps much of the day in the last few weeks.     Assessment / Plan / Recommendation Clinical Impression  Pt presents with mild oral dysphagia likely due to her cognitive deficits and lack of upper dentures.  Oral pocketing mild on right sulci noted that cleared with further intake.  Pt is impulsive and takes large straw boluses of liquids causing her to cough x1/3 large sequential boluses - possible airway infiltration.  Small boluses tolerated well without indication of airway compromise.   No indication of severe pharyngeal residuals present and thus clinically pt presents with largely intact swallow.    No family present during evaluation - thus will follow up to educate them to risk factors for aspiration pnas and mitigation strategies. Recommend pt continue dys3/thin diet with precautions.   SLP Visit Diagnosis: Dysphagia, oral phase (R13.11)  Aspiration Risk  Mild aspiration risk    Diet Recommendation Dysphagia 3 (Mech soft)   Liquid Administration via: Cup;Straw Medication Administration: (as tolerated) Supervision: Staff to assist with self feeding Compensations: Slow rate;Small sips/bites Postural Changes: Seated upright at 90 degrees;Remain upright for at least 30 minutes after po intake    Other  Recommendations Oral Care Recommendations: Oral care BID   Follow up Recommendations        Frequency and Duration min 1 x/week  1 week       Prognosis Prognosis for Safe Diet Advancement: Good Barriers to Reach Goals: Cognitive deficits      Swallow Study   General HPI: 84 yo female  who stays at [facility] admit to [facility] after fall.  Pt with negative CT head, + facial laceration.  CXR showed concerns for CHF, pt was given Lasix and ABX.  Swallow evaluation ordered.  PMH + for dementia, HTN, HLD, DM2, mini stroke.  Family reports pt does not have dysphagia but they do "chop up her food".  Swallow evaluation ordered.  CXR indicating patchy right lower lobe opacity - infiltrate vs atelectasis.  Pt was lethargic earlier and unable to eat.   Nurse tech reports pt fed herself without difficulties this am for breakfast.  Daughter states pt has oxgyen to use prn at her facility but she does not use it.  Pt is seen by hospice workers once a week and oxygen saturations are adequate per daughter.  Daughter also states pt sleeps much of the day in the last few weeks.      Oral/Motor/Sensory Function Overall Oral Motor/Sensory Function: Other (comment)(pt able to seal lips on spoon and straw)   Ice Chips Ice chips: Not tested   Thin Liquid Presentation: Cup;Straw Pharyngeal  Phase Impairments: Cough - Delayed Other Comments: pt takes large sequential boluses    Nectar Thick Nectar Thick Liquid: Not tested   Honey Thick Honey Thick Liquid: Not tested   Puree Puree: Within functional limits Presentation: Spoon   Solid     Solid: Impaired Oral Phase Impairments: Impaired mastication Oral Phase Functional Implications: Prolonged oral transit(? mild oral pocketing on the right) Other Comments: pt with only lower denture, unable to bite off graham cracker bolus      [name] 01/03/2018,6:40 PM  Donavan Burnet, MS [facility] SLP Acute Rehab Services Pager [phone] Office [phone]

## 2018-01-03 NOTE — ED Provider Notes (Addendum)
TIME SEEN: 2:57 AM  CHIEF COMPLAINT: Fall  HPI: Patient is an 84 year old female with history of CAD, CHF, hypertension, hyperlipidemia, chronic kidney disease, dementia who lives at [facility] nursing home who had an unwitnessed fall today.  Staff states that they found patient on the floor with her pants around her ankles.  She was not using her walker.  Found to be hypoxic here with sats of 86% on room air.  Staff reports that she does not wear oxygen chronically.  No recent hypoxia that they are aware of, fever or cough.  Patient is a DNR.  Patient is on hospice.  ROS: Level 5 caveat for dementia  PAST MEDICAL HISTORY/PAST SURGICAL HISTORY:  Past Medical History:  Diagnosis Date  . Anemia   . Anxiety disorder   . Carotid artery occlusion   . Carotid bruit   . Chronic kidney disease (CKD), stage III (moderate) (HCC)   . Coronary artery disease    Remote PCI. Had BMS to RCA and DES stent to Diagonal in Jan. 2012   . Diastolic heart failure    EF 55 to 60% per cath in Jan 2012  . DVT (deep venous thrombosis) (HCC)   . Gout   . Hyperlipidemia   . Hypertension   . Ischemic cardiomyopathy    with prior EF of 35%  . NSTEMI (non-ST elevated myocardial infarction) [facility]) Jan 2012   with PCI to RCA and DX per Dr. Excell Seltzer  . Obesity   . Pneumonia 2016  . Proteinuria   . Stroke [facility]) 2013   ?  mini    . TIA (transient ischemic attack)   . Type II diabetes mellitus (HCC)    long standing    MEDICATIONS:  Prior to Admission medications   Medication Sig Start Date End Date Taking? Authorizing Provider  aspirin EC 81 MG tablet Take 1 tablet (81 mg total) by mouth daily. 12/11/16   Albertine Grates, MD  BIDIL 20-37.5 MG tablet TAKE 1 TABLET BY MOUTH TWICE DAILY 05/03/17   Kirt Boys, DO  BYSTOLIC 20 MG TABS TAKE 1 TABLET BY MOUTH EVERY DAY FOR BLOOD PRESSURE 07/28/17   Kirt Boys, DO  cephALEXin (KEFLEX) 500 MG capsule Take 1 capsule (500 mg total) by mouth 4 (four) times  daily. Patient not taking: Reported on 08/24/2017 05/03/17   Molpus, Jonny Ruiz, MD  ciprofloxacin (CIPRO) 250 MG tablet Take 1 tablet (250 mg total) by mouth 2 (two) times daily. Patient not taking: Reported on 08/24/2017 05/05/17   Sharon Seller, NP  cloNIDine (CATAPRES) 0.1 MG tablet Take 1 tablet (0.1 mg total) by mouth 2 (two) times daily. 03/22/17   Marinda Elk, MD  docusate sodium (COLACE) 100 MG capsule Take 1 capsule (100 mg total) by mouth every 12 (twelve) hours. 01/09/17   Aspasia Rude, Layla Maw, DO  ezetimibe-simvastatin (VYTORIN) 10-20 MG tablet TAKE 1/2 TABLET BY MOUTH ONCE NIGHTLY TO CONTROL CHOLESTEROL 04/13/17   Kirt Boys, DO  feeding supplement, ENSURE ENLIVE, (ENSURE ENLIVE) LIQD Take 237 mLs by mouth 2 (two) times daily between meals. 12/11/16   Albertine Grates, MD  furosemide (LASIX) 20 MG tablet TAKE ONE TABLET BY MOUTH TWICE DAILY FOR HYPERTENSION 05/10/17   Kirt Boys, DO  hydrocortisone (PROCTOZONE-HC) 2.5 % rectal cream APPLY TO RECTUM 3 TIMES DAILY FOR DISCOMFORT 08/02/17   Kirt Boys, DO  LOTEMAX 0.5 % GEL PLACE 1 DROP IN THE LEFT EYE TWICE DAILY 03/24/17   Kirt Boys, DO  nitroGLYCERIN (NITROSTAT) 0.4  MG SL tablet Place one under tongue for chest pain, up to 3 tablets 03/24/17   Kirt Boys, DO  OLANZapine zydis (ZYPREXA) 5 MG disintegrating tablet Take 1 tablet (5 mg total) by mouth at bedtime. Patient not taking: Reported on 08/24/2017 04/07/17   Darlin Drop, DO  polyethylene glycol powder (GLYCOLAX/MIRALAX) powder Take 17 g by mouth daily as needed for mild constipation.  01/16/17   [provider]  timolol (BETIMOL) 0.5 % ophthalmic solution INSTILL 1 DROP INTO THE LEFT EYE DAILY 03/24/17   Kirt Boys, DO    ALLERGIES:  Allergies  Allergen Reactions  . Namzaric [Memantine Hcl-Donepezil Hcl] Nausea Only and Other (See Comments)    confusion  . Percocet [Oxycodone-Acetaminophen] Other (See Comments)    loosy goosy  . Sulfa Antibiotics Other (See  Comments)    Tongue swells  . Adhesive [Tape] Other (See Comments)    Tears skin.  Please use "paper" tape  . Metformin And Related Diarrhea and Other (See Comments)    Abnormal Kidney Functions   . Tradjenta [Linagliptin] Other (See Comments)    Low back ache, resolved once medication stopped   . Penicillins Hives and Other (See Comments)    Tolerates Ceftriaxone, Has patient had a PCN reaction causing immediate rash, facial/tongue/throat swelling, SOB or lightheadedness with hypotension: Unknown Has patient had a PCN reaction causing severe rash involving mucus membranes or skin necrosis: No Has patient had a PCN reaction that required hospitalization No Has patient had a PCN reaction occurring within the last 10 years: No If all of the above answers are "NO", then may proceed with Cephalosporin use.     SOCIAL HISTORY:  Social History   Tobacco Use  . Smoking status: Former Games developer  . Smokeless tobacco: Never Used  . Tobacco comment: "smoked in my teens; quit after 1 year or so"  Substance Use Topics  . Alcohol use: No    Alcohol/week: 0.0 standard drinks    FAMILY HISTORY: Family History  Problem Relation Age of Onset  . Diabetes Daughter   . CAD Neg Hx     EXAM: BP (!) 165/90   Pulse 69   Temp 98.4 F (36.9 C) (Oral)   Resp 18   SpO2 92%  CONSTITUTIONAL: Alert but demented, does not answer questions or follow commands HEAD: Normocephalic; small abrasion lateral to the right eyebrow EYES: Conjunctivae clear, PERRL, EOMI ENT: normal nose; no rhinorrhea; moist mucous membranes; pharynx without lesions noted; no dental injury; no septal hematoma NECK: Supple, no meningismus, no LAD; no midline spinal tenderness, step-off or deformity; trachea midline CARD: RRR; S1 and S2 appreciated; no murmurs, no clicks, no rubs, no gallops RESP: Normal chest excursion without splinting or tachypnea; breath sounds clear and equal bilaterally; no wheezes, no rhonchi, no rales;  patient is hypoxic on room air 86%.  No respiratory distress.  Doing well on 2 L nasal cannula. CHEST:  chest wall stable, no crepitus or ecchymosis or deformity, nontender to palpation; no flail chest ABD/GI: Normal bowel sounds; non-distended; soft, non-tender, no rebound, no guarding; no ecchymosis or other lesions noted PELVIS:  stable, nontender to palpation BACK:  The back appears normal and is non-tender to palpation, there is no CVA tenderness; no midline spinal tenderness, step-off or deformity EXT: Skin tear to the posterior right elbow with bony tenderness but no deformity.  Normal ROM in all joints; otherwise extremities are non-tender to palpation; no edema; normal capillary refill; no cyanosis, no  bony deformity  of patient's extremities, no joint effusion, compartments are soft, extremities are warm and well-perfused, no ecchymosis SKIN: Normal color for age and race; warm NEURO: Moves all extremities equally PSYCH: The patient's mood and manner are appropriate. Grooming and personal hygiene are appropriate.  MEDICAL DECISION MAKING: Patient here with unwitnessed fall.  Will obtain CT of her head, cervical spine and x-ray of the right elbow.  We will update her tetanus vaccination given the skin tear to her right elbow.  She does not need sutures at this time.  She was found to be hypoxic and has a new oxygen requirement.  Has history of CHF.  Will obtain labs, EKG, chest x-ray.  No fever or cough.  ED PROGRESS: CT of the head and cervical spine show no acute injury.  X-ray of the right elbow also shows no acute injury.  Chest x-ray concerning for pulmonary edema and she appears to have atelectasis in the right lower lobe.  Doubt pneumonia she has no cough, fever or leukocytosis.  Patient is on hospice.  I have attempted to get in touch with her daughter to discuss goals of care.  I did talk to the nursing home staff and unfortunately they were not very helpful.  Patient's BNP is  elevated at 1900.  Troponin mildly elevated at 0.05 which appears to be baseline.  Patient will receive Lasix for diuresis.  At this time I have not heard back from her family members.  Will discuss with hospitalist for admission for IV diuresis.  Patient has new oxygen requirement.   5:19 AM Discussed patient's case with hospitalist, Dr. Julian Reil.  I have recommended admission and patient (and family if present) agree with this plan. Admitting physician will place admission orders.   I reviewed all nursing notes, vitals, pertinent previous records, EKGs, lab and urine results, imaging (as available).  7:10 AM  D/w pt's daughter who agrees with plan for admission.    EKG Interpretation  Date/Time:  Wednesday January 03 2018 04:44:38 EDT Ventricular Rate:  79 PR Interval:    QRS Duration: 142 QT Interval:  413 QTC Calculation: 474 R Axis:   -178 Text Interpretation:  Sinus rhythm Ventricular premature complex Left bundle branch block Baseline wander in lead(s) V4 atrial fibrillation has resolved Confirmed by Rochele Raring [phone]) on 01/03/2018 4:50:11 AM         Nyjae Hodge, Layla Maw, DO 01/03/18 0519    Rosann Gorum, Layla Maw, DO 01/03/18 6045

## 2018-01-03 NOTE — ED Notes (Signed)
ED TO INPATIENT HANDOFF REPORT  Name/Age/Gender [name] 84 y.o. female  Code Status Code Status History    Date Active Date Inactive Code Status Order ID Comments User Context   04/06/2017 1559 04/07/2017 1954 DNR [id number]  [name], NP Inpatient   03/31/2017 0520 04/06/2017 1559 Full Code [id number]  [name], PA-C ED   03/20/2017 1205 03/22/2017 1529 Full Code [id number]  [name], MD Inpatient   03/20/2017 0927 03/20/2017 1205 DNR [id number]  [name], MD ED   03/09/2017 1509 03/13/2017 1730 DNR [id number]  [name], MD ED   02/05/2017 0123 02/09/2017 1700 Full Code [id number]  [name], MD ED   12/09/2016 0300 12/11/2016 1613 DNR [id number]  [name], MD ED   06/12/2015 1741 06/15/2015 1744 DNR [id number]  [name], NP ED   04/02/2015 1654 04/06/2015 2021 Full Code [id number]  [name], NP Inpatient   06/03/2014 2339 06/09/2014 1500 Full Code [id number]  [name], DO ED   01/17/2014 1925 01/19/2014 1635 Full Code [id number]  [name], PA-C Inpatient    Questions for Most Recent Historical Code Status (Order [id number])    Question Answer Comment   In the event of cardiac or respiratory ARREST Do not call a "code blue"    In the event of cardiac or respiratory ARREST Do not perform Intubation, CPR, defibrillation or ACLS    In the event of cardiac or respiratory ARREST Use medication by any route, position, wound care, and other measures to relive pain and suffering. May use oxygen, suction and manual treatment of airway obstruction as needed for comfort.         Advance Directive Documentation     Most Recent Value  Type of Advance Directive  Out of facility DNR (pink MOST or yellow form)  Pre-existing out of facility DNR order (yellow form or pink MOST form)  Pink MOST form placed in chart (order not valid for inpatient use)  "MOST" Form in Place?  -      Home/SNF/Other Nursing Home  Chief  Complaint Fall  Level of Care/Admitting Diagnosis ED Disposition    ED Disposition Condition [place]: [place] [[id number]]  Level of Care: Telemetry [5]  Admit to tele based on following criteria: Acute CHF  Diagnosis: CHF (congestive heart failure) [facility]) [[id number]]  Admitting Physician: Caren Griffins [phone]  Attending Physician: Caren Griffins [[id number]]  PT Class (Do Not Modify): Observation [104]  PT Acc Code (Do Not Modify): Observation [10022]       Medical History Past Medical History:  Diagnosis Date  . Anemia   . Anxiety disorder   . Carotid artery occlusion   . Carotid bruit   . Chronic kidney disease (CKD), stage III (moderate) (HCC)   . Coronary artery disease    Remote PCI. Had BMS to RCA and DES stent to Diagonal in Jan. 2012   . Diastolic heart failure    EF 55 to 60% per cath in Jan 2012  . DVT (deep venous thrombosis) ([place])   . Gout   . Hyperlipidemia   . Hypertension   . Ischemic cardiomyopathy    with prior EF of 35%  . NSTEMI (non-ST elevated myocardial infarction) [facility]) Jan 2012   with PCI to RCA and DX per Dr. Burt Knack  . Obesity   . Pneumonia 2016  . Proteinuria   . Stroke [facility]) 2013   ?  mini    . TIA (transient ischemic attack)   . Type II diabetes mellitus (HCC)    long standing    Allergies Allergies  Allergen Reactions  . Namzaric [Memantine Hcl-Donepezil Hcl] Nausea Only and Other (See Comments)    confusion  . Percocet [Oxycodone-Acetaminophen] Other (See Comments)    loosy goosy  . Sulfa Antibiotics Other (See Comments)    Tongue swells  . Adhesive [Tape] Other (See Comments)    Tears skin.  Please use "paper" tape  . Metformin And Related Diarrhea and Other (See Comments)    Abnormal Kidney Functions   . Tradjenta [Linagliptin] Other (See Comments)    Low back ache, resolved once medication stopped   . Penicillins Hives and Other (See Comments)    Tolerates Ceftriaxone, Has patient  had a PCN reaction causing immediate rash, facial/tongue/throat swelling, SOB or lightheadedness with hypotension: Unknown Has patient had a PCN reaction causing severe rash involving mucus membranes or skin necrosis: No Has patient had a PCN reaction that required hospitalization No Has patient had a PCN reaction occurring within the last 10 years: No If all of the above answers are "NO", then may proceed with Cephalosporin use.     IV Location/Drains/Wounds Patient Lines/Drains/Airways Status   Active Line/Drains/Airways    Name:   Placement date:   Placement time:   Site:   Days:   Peripheral IV 01/03/18 Left;Lateral Forearm   01/03/18    0515    Forearm   less than 1   Urethral Catheter Arsenio Katz Double-lumen 14 Fr.   05/03/17    0221    Double-lumen   245   Pressure Injury 03/31/17 Stage II -  Partial thickness loss of dermis presenting as a shallow open ulcer with a red, pink wound bed without slough.   03/31/17    0703     278          Labs/Imaging Results for orders placed or performed during the hospital encounter of 01/03/18 (from the past 48 hour(s))  CBC with Differential     Status: Abnormal   Collection Time: 01/03/18  3:53 AM  Result Value Ref Range   WBC 9.4 4.0 - 10.5 K/uL   RBC 3.31 (L) 3.87 - 5.11 MIL/uL   Hemoglobin 9.9 (L) 12.0 - 15.0 g/dL   HCT 32.0 (L) 36.0 - 46.0 %   MCV 96.7 80.0 - 100.0 fL   MCH 29.9 26.0 - 34.0 pg   MCHC 30.9 30.0 - 36.0 g/dL   RDW 13.3 11.5 - 15.5 %   Platelets 221 150 - 400 K/uL   nRBC 0.0 0.0 - 0.2 %   Neutrophils Relative % 81 %   Neutro Abs 7.6 1.7 - 7.7 K/uL   Lymphocytes Relative 11 %   Lymphs Abs 1.0 0.7 - 4.0 K/uL   Monocytes Relative 6 %   Monocytes Absolute 0.6 0.1 - 1.0 K/uL   Eosinophils Relative 1 %   Eosinophils Absolute 0.1 0.0 - 0.5 K/uL   Basophils Relative 0 %   Basophils Absolute 0.0 0.0 - 0.1 K/uL   Immature Granulocytes 1 %   Abs Immature Granulocytes 0.06 0.00 - 0.07 K/uL    Comment: Performed at  [facility], [address], [place], [place] [address]  Basic metabolic panel     Status: Abnormal   Collection Time: 01/03/18  3:53 AM  Result Value Ref Range   Sodium 143 135 - 145 mmol/L   Potassium  4.0 3.5 - 5.1 mmol/L   Chloride 109 98 - 111 mmol/L   CO2 23 22 - 32 mmol/L   Glucose, Bld 165 (H) 70 - 99 mg/dL   BUN 47 (H) 8 - 23 mg/dL   Creatinine, Ser 1.90 (H) 0.44 - 1.00 mg/dL   Calcium 9.0 8.9 - 10.3 mg/dL   GFR calc non Af Amer 22 (L) >60 mL/min   GFR calc Af Amer 26 (L) >60 mL/min    Comment: (NOTE) The eGFR has been calculated using the CKD EPI equation. This calculation has not been validated in all clinical situations. eGFR's persistently <60 mL/min signify possible Chronic Kidney Disease.    Anion gap 11 5 - 15    Comment: Performed at [facility], [place] [address], [place], [place] [address]  Brain natriuretic peptide     Status: Abnormal   Collection Time: 01/03/18  3:53 AM  Result Value Ref Range   B Natriuretic Peptide 1,942.9 (H) 0.0 - 100.0 pg/mL    Comment: Performed at [facility], [place] [address] [place], [place] [address]  Troponin I     Status: Abnormal   Collection Time: 01/03/18  3:53 AM  Result Value Ref Range   Troponin I 0.05 (HH) <0.03 ng/mL    Comment: CRITICAL RESULT CALLED TO, READ BACK BY AND VERIFIED WITH: DEBBIE RN AT 0510 01/03/18 BY TIBBITTS,K Performed at [facility], [place] [address], [place], [place] [address]    Dg Chest 2 View  Result Date: 01/03/2018 CLINICAL DATA:  84 y/o F; fall at nursing home this a.m., shortness of breath, abrasion to olecranon area of the elbow. EXAM: CHEST - 2 VIEW COMPARISON:  08/24/2017 chest radiograph FINDINGS: Stable cardiomegaly given projection and technique. Aortic atherosclerosis with calcification. Reticular opacities of the lungs. Patchy consolidation in the right lung base. No pleural effusion or pneumothorax. Chronic  appearing right anterolateral rib fractures. No acute osseous abnormality is evident. IMPRESSION: 1. Patchy consolidation in the right lung base may represent pneumonia or atelectasis. 2. Reticular opacities of the lungs probably representing interstitial edema. 3. Stable cardiomegaly. 4.  Aortic Atherosclerosis (ICD10-I70.0). Electronically Signed   By: Kristine Garbe M.D.   On: 01/03/2018 03:47   Dg Elbow Complete Right  Result Date: 01/03/2018 CLINICAL DATA:  Patient fell in nursing home this morning. Shortness of breath. Abrasion to the olecranon area. EXAM: RIGHT ELBOW - COMPLETE 3+ VIEW COMPARISON:  None. FINDINGS: Diffuse bone demineralization. No significant effusion. No evidence of acute fracture or subluxation. No focal bone lesion or bone destruction. Bone cortex and trabecular architecture appear intact. No radiopaque soft tissue foreign bodies. IMPRESSION: No acute bony abnormalities. Electronically Signed   By: Lucienne Capers M.D.   On: 01/03/2018 03:37   Ct Head Wo Contrast  Result Date: 01/03/2018 CLINICAL DATA:  Fall with laceration to the right eyebrow EXAM: CT HEAD WITHOUT CONTRAST CT CERVICAL SPINE WITHOUT CONTRAST TECHNIQUE: Multidetector CT imaging of the head and cervical spine was performed following the standard protocol without intravenous contrast. Multiplanar CT image reconstructions of the cervical spine were also generated. COMPARISON:  CT brain 06/12/2015, MRI brain 09/05/2009 FINDINGS: CT HEAD FINDINGS Brain: No acute territorial infarction, hemorrhage or intracranial mass is visualized. Atrophy and small vessel ischemic changes of the white matter. Stable prominent ventricles. Vascular: No hyperdense vessels. Vertebral and carotid vascular calcification Skull: Normal. Negative for fracture or focal lesion. Sinuses/Orbits: No acute finding. Postsurgical changes of the anterior maxillary sinuses. Other:  None CT CERVICAL SPINE FINDINGS Alignment: No subluxation.   Facet alignment within normal limits. Skull base and vertebrae: No acute fracture. No primary bone lesion or focal pathologic process. Soft tissues and spinal canal: No prevertebral fluid or swelling. No visible canal hematoma. Disc levels: Mild degenerative changes at C4-C5, C5-C6, C6-C7. Normal facet degenerative change. Upper chest: Apical emphysema. Other: Plan IMPRESSION: 1. No CT evidence for acute intracranial abnormality. Atrophy and small vessel ischemic changes the matter 2. No acute osseous abnormality of the cervical spine Evidence for acute intracranial abnormality. Atrophy and small vessel ischemic changes of the white matter Electronically Signed   By: Donavan Foil M.D.   On: 01/03/2018 03:54   Ct Cervical Spine Wo Contrast  Result Date: 01/03/2018 CLINICAL DATA:  Fall with laceration to the right eyebrow EXAM: CT HEAD WITHOUT CONTRAST CT CERVICAL SPINE WITHOUT CONTRAST TECHNIQUE: Multidetector CT imaging of the head and cervical spine was performed following the standard protocol without intravenous contrast. Multiplanar CT image reconstructions of the cervical spine were also generated. COMPARISON:  CT brain 06/12/2015, MRI brain 09/05/2009 FINDINGS: CT HEAD FINDINGS Brain: No acute territorial infarction, hemorrhage or intracranial mass is visualized. Atrophy and small vessel ischemic changes of the white matter. Stable prominent ventricles. Vascular: No hyperdense vessels. Vertebral and carotid vascular calcification Skull: Normal. Negative for fracture or focal lesion. Sinuses/Orbits: No acute finding. Postsurgical changes of the anterior maxillary sinuses. Other: None CT CERVICAL SPINE FINDINGS Alignment: No subluxation.  Facet alignment within normal limits. Skull base and vertebrae: No acute fracture. No primary bone lesion or focal pathologic process. Soft tissues and spinal canal: No prevertebral fluid or swelling. No visible canal hematoma. Disc levels: Mild degenerative changes at  C4-C5, C5-C6, C6-C7. Normal facet degenerative change. Upper chest: Apical emphysema. Other: Plan IMPRESSION: 1. No CT evidence for acute intracranial abnormality. Atrophy and small vessel ischemic changes the matter 2. No acute osseous abnormality of the cervical spine Evidence for acute intracranial abnormality. Atrophy and small vessel ischemic changes of the white matter Electronically Signed   By: Donavan Foil M.D.   On: 01/03/2018 03:54    Pending Labs [employer] (From admission, onward)    Start     Ordered   Signed and Held  Comprehensive metabolic panel  Tomorrow morning,   R     Signed and Held   Signed and Held  CBC  Tomorrow morning,   R     Signed and Held   Signed and Held  HIV antibody (Routine Screening)  Once,   R     Signed and Held   Signed and Held  Culture, blood (routine x 2) Call MD if unable to obtain prior to antibiotics being given  BLOOD CULTURE X 2,   R    Comments:  If blood cultures drawn in Emergency Department - Do not draw and cancel order    Signed and Held   Signed and Held  Culture, sputum-assessment  Once,   R     Signed and Held   Signed and Held  Gram stain  Once,   R     Signed and Held   Signed and Held  Strep pneumoniae urinary antigen  Once,   R     Signed and Held          Vitals/Pain Today's Vitals   01/03/18 0253 01/03/18 0530 01/03/18 0725 01/03/18 0730  BP: (!) 165/90 (!) 195/84 (!) 192/64 (!) 199/79  Pulse: 69 67  Resp: 18 (!) 34 19 14  Temp: 98.4 F (36.9 C)     TempSrc: Oral     SpO2: 92% 93%      Isolation Precautions No active isolations  Medications Medications  Tdap (BOOSTRIX) injection 0.5 mL (0.5 mLs Intramuscular Given 01/03/18 0532)  furosemide (LASIX) injection 40 mg (40 mg Intravenous Given 01/03/18 0521)    Mobility walks with device

## 2018-01-03 NOTE — Progress Notes (Signed)
[facility] GIP RN Visit  This is a related and covered GIP admission of 10/16 with [facility] diagnosis of cerebral atherosclerosis per Dr. Jamie Brookes.  Pt has and OOF DNR.  Long term care facility activated EMS after the pt was found on the floor with her pants around her ankle, hypoxic.  Pt was transported to [facility] ED for evaluation.  [facility] was not notified prior to transport, and not until around 3pm today.  Pt was admitted for hypoxia, r/o PNA (chest xray was not conclusive initially) but probably heart failure exacerbation.  This is GIP day 1  Pt is on O2 via [place] @ 1 lpm 97% saturation, VS 129/56 HR 58 RR 22.  Pt alert, does not speak when spoken to.  Noted abrasion and bruise to right side of her forehead.  Denies any pain and appears to be comfortable.  She has received haldol PRN  IV fluids are at 10/hr, doxycycline 100mg  BID infusing (for CAP), gentle diureses lasix 40mg  BID (due to creat of 1.9)  Discharge planning: return to LTC facility once medically stable, with hospice services at facility  Updated [name] (daughter) by phone  Updated IDT   Thank you, Wallis Bamberg BSN, RN [facility] Liaison (listed in [place]) [phone]

## 2018-01-03 NOTE — Progress Notes (Signed)
Pt. Had 4 beat run of V tach. Asymptomatic. MD notified.

## 2018-01-03 NOTE — Evaluation (Signed)
SLP Cancellation Note  Patient Details Name: [name] MRN: [id number] DOB: 1927/06/10   Cancelled treatment:       Reason Eval/Treat Not Completed: Other (comment);Fatigue/lethargy limiting ability to participate(pt lethargic and unable to have po at this time, daughter report pt consume chopped foods prior to admit, deny pt having dysphagia symptoms)  Pt snoring and daughter [name] stated pt will not wake when she is snoring.  Will continue efforts.   Donavan Burnet, MS [facility] SLP Acute Rehab Services Pager [phone] Office [phone]  [name] 01/03/2018, 12:39 PM

## 2018-01-04 DIAGNOSIS — I272 Pulmonary hypertension, unspecified: Secondary | ICD-10-CM | POA: Diagnosis present

## 2018-01-04 DIAGNOSIS — J181 Lobar pneumonia, unspecified organism: Secondary | ICD-10-CM | POA: Diagnosis not present

## 2018-01-04 DIAGNOSIS — F0390 Unspecified dementia without behavioral disturbance: Secondary | ICD-10-CM | POA: Diagnosis present

## 2018-01-04 DIAGNOSIS — Z955 Presence of coronary angioplasty implant and graft: Secondary | ICD-10-CM | POA: Diagnosis not present

## 2018-01-04 DIAGNOSIS — I5043 Acute on chronic combined systolic (congestive) and diastolic (congestive) heart failure: Secondary | ICD-10-CM

## 2018-01-04 DIAGNOSIS — I5023 Acute on chronic systolic (congestive) heart failure: Secondary | ICD-10-CM | POA: Diagnosis present

## 2018-01-04 DIAGNOSIS — R680 Hypothermia, not associated with low environmental temperature: Secondary | ICD-10-CM | POA: Diagnosis not present

## 2018-01-04 DIAGNOSIS — I447 Left bundle-branch block, unspecified: Secondary | ICD-10-CM | POA: Diagnosis present

## 2018-01-04 DIAGNOSIS — J9601 Acute respiratory failure with hypoxia: Secondary | ICD-10-CM

## 2018-01-04 DIAGNOSIS — R630 Anorexia: Secondary | ICD-10-CM | POA: Diagnosis present

## 2018-01-04 DIAGNOSIS — S51011A Laceration without foreign body of right elbow, initial encounter: Secondary | ICD-10-CM | POA: Diagnosis present

## 2018-01-04 DIAGNOSIS — I252 Old myocardial infarction: Secondary | ICD-10-CM | POA: Diagnosis not present

## 2018-01-04 DIAGNOSIS — Z66 Do not resuscitate: Secondary | ICD-10-CM | POA: Diagnosis present

## 2018-01-04 DIAGNOSIS — W19XXXA Unspecified fall, initial encounter: Secondary | ICD-10-CM | POA: Diagnosis present

## 2018-01-04 DIAGNOSIS — Z515 Encounter for palliative care: Secondary | ICD-10-CM | POA: Diagnosis present

## 2018-01-04 DIAGNOSIS — R4701 Aphasia: Secondary | ICD-10-CM | POA: Diagnosis present

## 2018-01-04 DIAGNOSIS — I130 Hypertensive heart and chronic kidney disease with heart failure and stage 1 through stage 4 chronic kidney disease, or unspecified chronic kidney disease: Secondary | ICD-10-CM | POA: Diagnosis present

## 2018-01-04 DIAGNOSIS — I493 Ventricular premature depolarization: Secondary | ICD-10-CM | POA: Diagnosis present

## 2018-01-04 DIAGNOSIS — N184 Chronic kidney disease, stage 4 (severe): Secondary | ICD-10-CM | POA: Diagnosis present

## 2018-01-04 DIAGNOSIS — Z681 Body mass index (BMI) 19 or less, adult: Secondary | ICD-10-CM | POA: Diagnosis not present

## 2018-01-04 DIAGNOSIS — I2510 Atherosclerotic heart disease of native coronary artery without angina pectoris: Secondary | ICD-10-CM | POA: Diagnosis present

## 2018-01-04 DIAGNOSIS — E1122 Type 2 diabetes mellitus with diabetic chronic kidney disease: Secondary | ICD-10-CM | POA: Diagnosis present

## 2018-01-04 DIAGNOSIS — N183 Chronic kidney disease, stage 3 (moderate): Secondary | ICD-10-CM | POA: Diagnosis not present

## 2018-01-04 DIAGNOSIS — E785 Hyperlipidemia, unspecified: Secondary | ICD-10-CM | POA: Diagnosis present

## 2018-01-04 DIAGNOSIS — Z23 Encounter for immunization: Secondary | ICD-10-CM | POA: Diagnosis not present

## 2018-01-04 DIAGNOSIS — Y92129 Unspecified place in nursing home as the place of occurrence of the external cause: Secondary | ICD-10-CM | POA: Diagnosis not present

## 2018-01-04 DIAGNOSIS — I255 Ischemic cardiomyopathy: Secondary | ICD-10-CM | POA: Diagnosis present

## 2018-01-04 DIAGNOSIS — S00211A Abrasion of right eyelid and periocular area, initial encounter: Secondary | ICD-10-CM | POA: Diagnosis present

## 2018-01-04 LAB — COMPREHENSIVE METABOLIC PANEL
ALBUMIN: 3.1 g/dL — AB (ref 3.5–5.0)
ALK PHOS: 61 U/L (ref 38–126)
ALT: 9 U/L (ref 0–44)
AST: 17 U/L (ref 15–41)
Anion gap: 9 (ref 5–15)
BUN: 36 mg/dL — AB (ref 8–23)
CALCIUM: 8.7 mg/dL — AB (ref 8.9–10.3)
CHLORIDE: 109 mmol/L (ref 98–111)
CO2: 25 mmol/L (ref 22–32)
CREATININE: 1.53 mg/dL — AB (ref 0.44–1.00)
GFR calc non Af Amer: 29 mL/min — ABNORMAL LOW (ref >60)
GFR, EST AFRICAN AMERICAN: 34 mL/min — AB (ref >60)
GLUCOSE: 124 mg/dL — AB (ref 70–99)
Potassium: 4.1 mmol/L (ref 3.5–5.1)
SODIUM: 143 mmol/L (ref 135–145)
Total Bilirubin: 0.8 mg/dL (ref 0.3–1.2)
Total Protein: 5.9 g/dL — ABNORMAL LOW (ref 6.5–8.1)

## 2018-01-04 LAB — CBC
HEMATOCRIT: 30.9 % — AB (ref 36.0–46.0)
Hemoglobin: 9.3 g/dL — ABNORMAL LOW (ref 12.0–15.0)
MCH: 29.4 pg (ref 26.0–34.0)
MCHC: 30.1 g/dL (ref 30.0–36.0)
MCV: 97.8 fL (ref 80.0–100.0)
NRBC: 0 % (ref 0.0–0.2)
PLATELETS: 204 10*3/uL (ref 150–400)
RBC: 3.16 MIL/uL — ABNORMAL LOW (ref 3.87–5.11)
RDW: 13.2 % (ref 11.5–15.5)
WBC: 6.1 10*3/uL (ref 4.0–10.5)

## 2018-01-04 LAB — GLUCOSE, CAPILLARY
GLUCOSE-CAPILLARY: 85 mg/dL (ref 70–99)
GLUCOSE-CAPILLARY: 212 mg/dL — AB (ref 70–99)
GLUCOSE-CAPILLARY: 219 mg/dL — AB (ref 70–99)
Glucose-Capillary: 125 mg/dL — ABNORMAL HIGH (ref 70–99)
Glucose-Capillary: 122 mg/dL — ABNORMAL HIGH (ref 70–99)

## 2018-01-04 LAB — TROPONIN I: TROPONIN I: 0.04 ng/mL — AB (ref <0.03)

## 2018-01-04 LAB — HIV ANTIBODY (ROUTINE TESTING W REFLEX): HIV Screen 4th Generation wRfx: NONREACTIVE

## 2018-01-04 MED ORDER — TIMOLOL MALEATE 0.5 % OP SOLN
1.0000 [drp] | Freq: Every day | OPHTHALMIC | Status: DC
  Administered 2018-01-04 – 2018-01-05 (×2): 1 [drp] via OPHTHALMIC
  Filled 2018-01-04: qty 5

## 2018-01-04 NOTE — Progress Notes (Signed)
PROGRESS NOTE                                                                                                                                                                                                             Patient Demographics:    [name], is a 84 y.o. female, DOB - 1928/03/18, ZOX:[id number]  Admit date - 01/03/2018   Admitting Physician Costin Otelia Sergeant, MD  Outpatient Primary MD for the patient is Kirt Boys, DO  LOS - 0  Outpatient Specialists: none  Chief Complaint  Patient presents with  . Fall       Brief Narrative   84 year old female with history of severe dementia, CKD stage IV, CAD, chronic systolic and diastolic CHF, hypertension, hyperlipidemia presented from SNF, memory care unit with fall.  Staff at the facility found her on the floor with a pants around the ankles.  She was found to be hypoxic and brought to the ED.  Blood work showed creatinine of 1.9, BNP of 1900 and chest x-ray showed patchy consolidation in the right lung base possible for pneumonia versus atelectasis and interstitial edema.  Admitted for acute on chronic diastolic CHF with acute hypoxic respiratory failure+/-pneumonia.   Subjective:   Patient is sleepy morning to physical stimuli.  Remains nonverbal and noncommunicative.   Assessment  & Plan :   Principal problem Acute respiratory failure with hypoxia Secondary to acute on chronic combined systolic and diastolic CHF+/-lobar pneumonia. Continue IV Lasix 40 mg every 12 hours.  Strict I/O and daily weight.  Wean oxygen as tolerated. Empiric antibiotic coverage with doxycycline for pneumonia.  Chronic kidney disease stage IV Creatinine at baseline.  Monitor while on diuresis.  Essential hypertension Stable.  Continue home meds  Severe dementia Continue home meds.  Dysphagia level 3 diet per swallow evaluation.  Diabetes mellitus type 2, controlled A1c of  6.5.  Not on medication.  Monitor on SSI  Code Status : DNR  Family Communication  : None at bedside  Disposition Plan  : Return to facility tomorrow if diuresed well and off oxygen.  Barriers For Discharge : Improving symptoms  Consults  : None  Procedures  : None  DVT Prophylaxis  :  Lovenox -   Lab Results  Component Value Date   PLT 204 01/04/2018    Antibiotics  :  Anti-infectives (From admission, onward)   Start     Dose/Rate Route Frequency Ordered Stop   01/03/18 1000  doxycycline (VIBRAMYCIN) 100 mg in sodium chloride 0.9 % 250 mL IVPB     100 mg 125 mL/hr over 120 Minutes Intravenous Every 12 hours 01/03/18 0845 01/06/18 0959        Objective:   Vitals:   01/03/18 2124 01/04/18 0522 01/04/18 0846 01/04/18 1145  BP: (!) 138/59 (!) 153/57  (!) 180/50  Pulse: 67 (!) 49  (!) 46  Resp: 20 20  (!) 22  Temp: (!) 97.5 F (36.4 C)     TempSrc: Oral     SpO2: 96% 96% 96% 100%  Weight:      Height:        Wt Readings from Last 3 Encounters:  01/03/18 48.2 kg  05/05/17 54.9 kg  04/18/17 50.8 kg     Intake/Output Summary (Last 24 hours) at 01/04/2018 1321 Last data filed at 01/04/2018 1233 Gross per 24 hour  Intake 595 ml  Output 400 ml  Net 195 ml     Physical exam Somnolent, moaning to physical stimuli HEENT:  moist mucosa, supple neck Chest: Diminished breath sounds over lung bases CVS: N S1&S2, no murmurs, GI: soft, NT, ND, BS+ Musculoskeletal: warm, no edema     Data Review:    CBC Recent Labs  Lab 01/03/18 0353 01/04/18 0422  WBC 9.4 6.1  HGB 9.9* 9.3*  HCT 32.0* 30.9*  PLT 221 204  MCV 96.7 97.8  MCH 29.9 29.4  MCHC 30.9 30.1  RDW 13.3 13.2  LYMPHSABS 1.0  --   MONOABS 0.6  --   EOSABS 0.1  --   BASOSABS 0.0  --     Chemistries  Recent Labs  Lab 01/03/18 0353 01/04/18 0422  NA 143 143  K 4.0 4.1  CL 109 109  CO2 23 25  GLUCOSE 165* 124*  BUN 47* 36*  CREATININE 1.90* 1.53*  CALCIUM 9.0 8.7*  AST  --  17    ALT  --  9  ALKPHOS  --  61  BILITOT  --  0.8   ------------------------------------------------------------------------------------------------------------------ No results for input(s): CHOL, HDL, LDLCALC, TRIG, CHOLHDL, LDLDIRECT in the last 72 hours.  Lab Results  Component Value Date   HGBA1C 6.5 (H) 03/20/2017   ------------------------------------------------------------------------------------------------------------------ No results for input(s): TSH, T4TOTAL, T3FREE, THYROIDAB in the last 72 hours.  Invalid input(s): FREET3 ------------------------------------------------------------------------------------------------------------------ No results for input(s): VITAMINB12, FOLATE, FERRITIN, TIBC, IRON, RETICCTPCT in the last 72 hours.  Coagulation profile No results for input(s): INR, PROTIME in the last 168 hours.  No results for input(s): DDIMER in the last 72 hours.  Cardiac Enzymes Recent Labs  Lab 01/03/18 1616 01/03/18 2137 01/04/18 0422  TROPONINI 0.04* 0.04* 0.04*   ------------------------------------------------------------------------------------------------------------------    Component Value Date/Time   BNP 1,942.9 (H) 01/03/2018 0353    Inpatient Medications  Scheduled Meds: . cloNIDine  0.1 mg Oral BID  . furosemide  40 mg Intravenous Q12H  . heparin  5,000 Units Subcutaneous Q8H  . insulin aspart  0-9 Units Subcutaneous TID WC  . isosorbide-hydrALAZINE  1 tablet Oral BID  . nebivolol  10 mg Oral Daily  . polyethylene glycol  17 g Oral Daily  . QUEtiapine  25 mg Oral QHS  . timolol  1 drop Left Eye Daily   Continuous Infusions: . doxycycline (VIBRAMYCIN) IV 100 mg (01/04/18 1107)   PRN Meds:.haloperidol, hydrALAZINE, oxyCODONE  Micro Results Recent  Results (from the past 240 hour(s))  Culture, blood (routine x 2) Call MD if unable to obtain prior to antibiotics being given     Status: None (Preliminary result)   Collection Time:  01/03/18  9:25 AM  Result Value Ref Range Status   Specimen Description   Final    BLOOD LEFT HAND Performed at [facility], [address], [place], [place] [address]    Special Requests   Final    BOTTLES DRAWN AEROBIC ONLY Blood Culture results may not be optimal due to an inadequate volume of blood received in culture bottles Performed at [facility], [address], [place], [place] [address]    Culture   Final    NO GROWTH < 24 HOURS Performed at [facility], [address] [place], [place] [address]    Report Status PENDING  Incomplete  Culture, blood (routine x 2) Call MD if unable to obtain prior to antibiotics being given     Status: None (Preliminary result)   Collection Time: 01/03/18  9:25 AM  Result Value Ref Range Status   Specimen Description   Final    BLOOD RIGHT HAND Performed at [facility], [address] [place], [place] [address]    Special Requests   Final    BOTTLES DRAWN AEROBIC ONLY Blood Culture results may not be optimal due to an inadequate volume of blood received in culture bottles Performed at [facility], [address], [place], [place] [address]    Culture   Final    NO GROWTH < 24 HOURS Performed at [facility], [address], [place], [place] [address]    Report Status PENDING  Incomplete  MRSA PCR Screening     Status: None   Collection Time: 01/03/18  5:56 PM  Result Value Ref Range Status   MRSA by PCR NEGATIVE NEGATIVE Final    Comment:        The GeneXpert MRSA Assay (FDA approved for NASAL specimens only), is one component of a comprehensive MRSA colonization surveillance program. It is not intended to diagnose MRSA infection nor to guide or monitor treatment for MRSA infections. Performed at [facility], [address] [place], [place] [address]     Radiology Reports Dg Chest 2 View  Result Date:  01/03/2018 CLINICAL DATA:  84 y/o F; fall at nursing home this a.m., shortness of breath, abrasion to olecranon area of the elbow. EXAM: CHEST - 2 VIEW COMPARISON:  08/24/2017 chest radiograph FINDINGS: Stable cardiomegaly given projection and technique. Aortic atherosclerosis with calcification. Reticular opacities of the lungs. Patchy consolidation in the right lung base. No pleural effusion or pneumothorax. Chronic appearing right anterolateral rib fractures. No acute osseous abnormality is evident. IMPRESSION: 1. Patchy consolidation in the right lung base may represent pneumonia or atelectasis. 2. Reticular opacities of the lungs probably representing interstitial edema. 3. Stable cardiomegaly. 4.  Aortic Atherosclerosis (ICD10-I70.0). Electronically Signed   By: Mitzi Hansen M.D.   On: 01/03/2018 03:47   Dg Elbow Complete Right  Result Date: 01/03/2018 CLINICAL DATA:  Patient fell in nursing home this morning. Shortness of breath. Abrasion to the olecranon area. EXAM: RIGHT ELBOW - COMPLETE 3+ VIEW COMPARISON:  None. FINDINGS: Diffuse bone demineralization. No significant effusion. No evidence of acute fracture or subluxation. No focal bone lesion or bone destruction. Bone cortex and trabecular architecture appear intact. No radiopaque soft tissue foreign bodies. IMPRESSION:  No acute bony abnormalities. Electronically Signed   By: Burman Nieves M.D.   On: 01/03/2018 03:37   Ct Head Wo Contrast  Result Date: 01/03/2018 CLINICAL DATA:  Fall with laceration to the right eyebrow EXAM: CT HEAD WITHOUT CONTRAST CT CERVICAL SPINE WITHOUT CONTRAST TECHNIQUE: Multidetector CT imaging of the head and cervical spine was performed following the standard protocol without intravenous contrast. Multiplanar CT image reconstructions of the cervical spine were also generated. COMPARISON:  CT brain 06/12/2015, MRI brain 09/05/2009 FINDINGS: CT HEAD FINDINGS Brain: No acute territorial infarction,  hemorrhage or intracranial mass is visualized. Atrophy and small vessel ischemic changes of the white matter. Stable prominent ventricles. Vascular: No hyperdense vessels. Vertebral and carotid vascular calcification Skull: Normal. Negative for fracture or focal lesion. Sinuses/Orbits: No acute finding. Postsurgical changes of the anterior maxillary sinuses. Other: None CT CERVICAL SPINE FINDINGS Alignment: No subluxation.  Facet alignment within normal limits. Skull base and vertebrae: No acute fracture. No primary bone lesion or focal pathologic process. Soft tissues and spinal canal: No prevertebral fluid or swelling. No visible canal hematoma. Disc levels: Mild degenerative changes at C4-C5, C5-C6, C6-C7. Normal facet degenerative change. Upper chest: Apical emphysema. Other: Plan IMPRESSION: 1. No CT evidence for acute intracranial abnormality. Atrophy and small vessel ischemic changes the matter 2. No acute osseous abnormality of the cervical spine Evidence for acute intracranial abnormality. Atrophy and small vessel ischemic changes of the white matter Electronically Signed   By: Jasmine Pang M.D.   On: 01/03/2018 03:54   Ct Cervical Spine Wo Contrast  Result Date: 01/03/2018 CLINICAL DATA:  Fall with laceration to the right eyebrow EXAM: CT HEAD WITHOUT CONTRAST CT CERVICAL SPINE WITHOUT CONTRAST TECHNIQUE: Multidetector CT imaging of the head and cervical spine was performed following the standard protocol without intravenous contrast. Multiplanar CT image reconstructions of the cervical spine were also generated. COMPARISON:  CT brain 06/12/2015, MRI brain 09/05/2009 FINDINGS: CT HEAD FINDINGS Brain: No acute territorial infarction, hemorrhage or intracranial mass is visualized. Atrophy and small vessel ischemic changes of the white matter. Stable prominent ventricles. Vascular: No hyperdense vessels. Vertebral and carotid vascular calcification Skull: Normal. Negative for fracture or focal lesion.  Sinuses/Orbits: No acute finding. Postsurgical changes of the anterior maxillary sinuses. Other: None CT CERVICAL SPINE FINDINGS Alignment: No subluxation.  Facet alignment within normal limits. Skull base and vertebrae: No acute fracture. No primary bone lesion or focal pathologic process. Soft tissues and spinal canal: No prevertebral fluid or swelling. No visible canal hematoma. Disc levels: Mild degenerative changes at C4-C5, C5-C6, C6-C7. Normal facet degenerative change. Upper chest: Apical emphysema. Other: Plan IMPRESSION: 1. No CT evidence for acute intracranial abnormality. Atrophy and small vessel ischemic changes the matter 2. No acute osseous abnormality of the cervical spine Evidence for acute intracranial abnormality. Atrophy and small vessel ischemic changes of the white matter Electronically Signed   By: Jasmine Pang M.D.   On: 01/03/2018 03:54    Time Spent in minutes  25   Rosella Crandell M.D on 01/04/2018 at 1:21 PM  Between 7am to 7pm - Pager - [phone]  After 7pm go to [email or web address] - password [facility]  [facility] -  Office  [phone]

## 2018-01-04 NOTE — Progress Notes (Signed)
Dr. Gonzella Lex notified regarding patient's latest vital signs including rectal temp of 95.5. Patient's skin cool to touch, thermostat in patient's room was on 55, turned to 75, and provided patient with warm blankets.

## 2018-01-04 NOTE — Plan of Care (Signed)
Plan of care reviewed. 

## 2018-01-04 NOTE — Progress Notes (Signed)
SLP Cancellation Note  Patient Details Name: KESA BIRKY MRN: [id number] DOB: March 31, 1927   Cancelled treatment:       Reason Eval/Treat Not Completed: Other (comment);Fatigue/lethargy limiting ability to participate(pt sleepy, note agitation from last pm)   [name] 01/04/2018, 3:22 PM  Donavan Burnet, MS [facility] SLP Acute Rehab Services Pager [phone] Office [phone]

## 2018-01-04 NOTE — Progress Notes (Signed)
[facility] GIP RN Visit  This is a related and covered GIP admission of 10/16 with [facility] diagnosis of cerebral atherosclerosis per Dr. Jamie Brookes.  Pt has and OOF DNR.  Long term care facility activated EMS after the pt was found on the floor with her pants around her ankle, hypoxic.  Pt was transported to [facility] ED for evaluation.  [facility] was not notified prior to transport, and not until around 3pm today.  Pt was admitted for hypoxia, r/o PNA (chest xray was not conclusive initially) but probably heart failure exacerbation.  This is GIP day 2.  Visited patient at bedside, daughter, [name] is present along with a sitter. Patient sleeping and in no apparent distress.  A sitter had to be arrange as patient became agitated and repeatedly tried to get up yesterday. VVS and SpO2 is 96% on 2Lnc.   Medications: Doxycycline 100mg  IVPB BID, Ativan 0.5mg  IV PRN given at 0551. She received Haldol 2mg  PO yesterday for agitation.   No new progress notes or labs recorded today. Daughter states she expects patient to stay at least a couple more days.   Discharge planning: return to LTC facility once medically stable, with hospice services at facility. Please use [facility] for ambulance transport.  Updated IDT.  Please call with any hospice related questions or concerns.  Thank you, Elsie Saas, RN, [facility] Liaison (listed in [place]) [phone]

## 2018-01-04 NOTE — Progress Notes (Signed)
Report received from [place], [place]. No change from initial pm report. Will continue to monitor and follow the POC.

## 2018-01-05 DIAGNOSIS — N183 Chronic kidney disease, stage 3 (moderate): Secondary | ICD-10-CM

## 2018-01-05 DIAGNOSIS — J181 Lobar pneumonia, unspecified organism: Secondary | ICD-10-CM | POA: Diagnosis present

## 2018-01-05 DIAGNOSIS — J9601 Acute respiratory failure with hypoxia: Secondary | ICD-10-CM | POA: Diagnosis present

## 2018-01-05 LAB — BASIC METABOLIC PANEL
Anion gap: 11 (ref 5–15)
BUN: 60 mg/dL — ABNORMAL HIGH (ref 8–23)
CHLORIDE: 107 mmol/L (ref 98–111)
CO2: 25 mmol/L (ref 22–32)
Calcium: 8.5 mg/dL — ABNORMAL LOW (ref 8.9–10.3)
Creatinine, Ser: 2.01 mg/dL — ABNORMAL HIGH (ref 0.44–1.00)
GFR calc Af Amer: 24 mL/min — ABNORMAL LOW (ref >60)
GFR calc non Af Amer: 21 mL/min — ABNORMAL LOW (ref >60)
Glucose, Bld: 150 mg/dL — ABNORMAL HIGH (ref 70–99)
Potassium: 5.1 mmol/L (ref 3.5–5.1)
Sodium: 143 mmol/L (ref 135–145)

## 2018-01-05 LAB — TSH: TSH: 6.254 u[IU]/mL — ABNORMAL HIGH (ref 0.350–4.500)

## 2018-01-05 LAB — GLUCOSE, CAPILLARY
GLUCOSE-CAPILLARY: 126 mg/dL — AB (ref 70–99)
GLUCOSE-CAPILLARY: 178 mg/dL — AB (ref 70–99)

## 2018-01-05 MED ORDER — DOXYCYCLINE HYCLATE 100 MG PO TABS: 100.0000 mg | ORAL_TABLET | Freq: Two times a day (BID) | ORAL | 0 refills | Status: AC

## 2018-01-05 NOTE — Progress Notes (Signed)
[facility] GIP RN Visit @ 0830   This is a related and covered GIP admission of 10/16 with [facility] diagnosis of cerebral atherosclerosis per Dr. Jamie Brookes. Pt has and OOF DNR. Long term care facility activated EMS after the pt was found on the floor with her pants around her ankle, hypoxic. Pt was transported to [facility] ED for evaluation. [facility] was not notified prior to transport, and not until around 3pm today. Pt was admitted for hypoxia, r/o PNA (chest xray was not conclusive initially) but probably heart failure exacerbation.  This is GIP day 3  Visited patient at bedside, pt asleep and I did not wake her.  She had needed a safety sitter yesterday for confusion, however appears to be more restful. Through the night, RN staff reports hypothermia with 95 per rectum.   Labs from today reveal rise in creatinine and potassium.  Pt has been diuresed, no wt recorded since 10/16.  Pt on O2 at 1 lpm.    Medications: lasix 40 BID, doxycycline 100 mg IV BID.  No PRNs administered throughout the evening.     Discharge planning: Return to LTC facility once medically stable, with hospice services at facility.  Once she is ready for discharge, she does not need any additional DME.  She has O2 already at [facility] (normally on 2lpm).  Please use [facility] for ambulance transport as they contract to provide this service for [facility].  Updated IDT and family.  Please call with any hospice related questions or concerns.  Wallis Bamberg BSN, RN [facility] Liaison (listed in [place]

## 2018-01-05 NOTE — Progress Notes (Signed)
[facility] Note:  Attempted to contact both daughters listed to advise that their mother will be discharged later today.  No option to leave VM for either on cell phones.  Advised [facility] RN that will see her in facility of the same.  Thank you, Wallis Bamberg BSN, RN [facility] Liaison (listed in [place]) [phone]

## 2018-01-05 NOTE — Discharge Summary (Signed)
Physician Discharge Summary  [name] ZOX:[id number] DOB: November 21, 1927 DOA: 01/03/2018  PCP: Kirt Boys, DO  Admit date: 01/03/2018 Discharge date: 01/05/2018  Admitted From: Assisted living Disposition: Assisted living  Recommendations for Outpatient Follow-up:  1. Follow-up with MD/hospice at facility.  Patient will complete 5-day course of antibiotic on 10/21.  Home Health: None Equipment/Devices: Per facility  Discharge Condition: Fair CODE STATUS: DNR Diet recommendation: Soft with thin liquid    Discharge Diagnoses:  Principal problem Acute respiratory failure with hypoxia  Active Problems:   CKD (chronic kidney disease), stage III (HCC)   Expressive aphasia   Dementia without behavioral disturbance (HCC)   Acute on chronic combined systolic (congestive) and diastolic (congestive) heart failure (HCC)   Lobar pneumonia, unspecified organism [facility])  Brief narrative/HPI  84 year old female with history of severe dementia, CKD stage IV, CAD, chronic systolic and diastolic CHF, hypertension, hyperlipidemia presented from SNF, memory care unit with fall.  Staff at the facility found her on the floor with a pants around the ankles.  She was found to be hypoxic and brought to the ED.  Blood work showed creatinine of 1.9, BNP of 1900 and chest x-ray showed patchy consolidation in the right lung base possible for pneumonia versus atelectasis and interstitial edema.  Admitted for acute on chronic diastolic CHF with acute hypoxic respiratory failure+/-pneumonia.   Hospital course  Principal problem Acute respiratory failure with hypoxia (HCC) Secondary to acute on chronic combined systolic and diastolic CHF+/-lobar pneumonia. Received IV Lasix 40 mg every 12 hours.  Urine output not properly documented.  Oxygen weaned off. Empiric antibiotic coverage with doxycycline for pneumonia.  Blood cultures negative. Patient will be discharged back to the facility on her home dose  of Lasix.  Chronic kidney disease stage IV Creatinine at baseline.    (1.9-2.1)  Essential hypertension Stable.  Continue home meds  Severe dementia Continue home meds.  Soft diet.  Diabetes mellitus type 2, controlled A1c of 6.5.  Not on medication.    CBG stable.  Hypothermia on 10/17. Temperature dropped to 95 F.  No other symptoms.  Blood culture was negative.  Other vitals remained stable.  Improved with one blanket.  Inadequate p.o. intake  Added supplement.  Family Communication  : None at bedside  Disposition Plan  : Return to assisted living facility Consults  : None  Procedures  : None   Discharge Instructions   Allergies as of 01/05/2018      Reactions   Namzaric [memantine Hcl-donepezil Hcl] Nausea Only, Other (See Comments)   confusion   Percocet [oxycodone-acetaminophen] Other (See Comments)   loosy goosy   Sulfa Antibiotics Other (See Comments)   Tongue swells   Adhesive [tape] Other (See Comments)   Tears skin.  Please use "paper" tape   Metformin And Related Diarrhea, Other (See Comments)   Abnormal Kidney Functions    Tradjenta [linagliptin] Other (See Comments)   Low back ache, resolved once medication stopped    Penicillins Hives, Other (See Comments)   Tolerates Ceftriaxone, Has patient had a PCN reaction causing immediate rash, facial/tongue/throat swelling, SOB or lightheadedness with hypotension: Unknown Has patient had a PCN reaction causing severe rash involving mucus membranes or skin necrosis: No Has patient had a PCN reaction that required hospitalization No Has patient had a PCN reaction occurring within the last 10 years: No If all of the above answers are "NO", then may proceed with Cephalosporin use.      Medication List  STOP taking these medications   aspirin EC 81 MG tablet   cephALEXin 500 MG capsule Commonly known as:  KEFLEX   ciprofloxacin 250 MG tablet Commonly known as:  CIPRO   docusate sodium 100 MG  capsule Commonly known as:  COLACE   ezetimibe-simvastatin 10-20 MG tablet Commonly known as:  VYTORIN   OLANZapine zydis 5 MG disintegrating tablet Commonly known as:  ZYPREXA     TAKE these medications   BIDIL 20-37.5 MG tablet Generic drug:  isosorbide-hydrALAZINE TAKE 1 TABLET BY MOUTH TWICE DAILY   cloNIDine 0.1 MG tablet Commonly known as:  CATAPRES Take 1 tablet (0.1 mg total) by mouth 2 (two) times daily.   doxycycline 100 MG tablet Commonly known as:  VIBRA-TABS Take 1 tablet (100 mg total) by mouth 2 (two) times daily for 3 days.   feeding supplement (ENSURE ENLIVE) Liqd Take 237 mLs by mouth 2 (two) times daily between meals.   furosemide 40 MG tablet Commonly known as:  LASIX Take 40 mg by mouth daily. What changed:  Another medication with the same name was removed. Continue taking this medication, and follow the directions you see here.   haloperidol 2 MG/ML solution Commonly known as:  HALDOL Take 5 mg by mouth daily.   hydrocortisone 2.5 % rectal cream Commonly known as:  ANUSOL-HC APPLY TO RECTUM 3 TIMES DAILY FOR DISCOMFORT   LOTEMAX 0.5 % Gel Generic drug:  Loteprednol Etabonate PLACE 1 DROP IN THE LEFT EYE TWICE DAILY What changed:  See the new instructions.   nebivolol 10 MG tablet Commonly known as:  BYSTOLIC Take 10 mg by mouth daily. What changed:  Another medication with the same name was removed. Continue taking this medication, and follow the directions you see here.   nitroGLYCERIN 0.4 MG SL tablet Commonly known as:  NITROSTAT Place one under tongue for chest pain, up to 3 tablets   oxyCODONE 5 MG immediate release tablet Commonly known as:  Oxy IR/ROXICODONE Take 5 mg by mouth every 4 (four) hours as needed for severe pain.   polyethylene glycol powder powder Commonly known as:  GLYCOLAX/MIRALAX Take 17 g by mouth daily.   timolol 0.5 % ophthalmic solution Commonly known as:  BETIMOL INSTILL 1 DROP INTO THE LEFT EYE DAILY        Allergies  Allergen Reactions  . Namzaric [Memantine Hcl-Donepezil Hcl] Nausea Only and Other (See Comments)    confusion  . Percocet [Oxycodone-Acetaminophen] Other (See Comments)    loosy goosy  . Sulfa Antibiotics Other (See Comments)    Tongue swells  . Adhesive [Tape] Other (See Comments)    Tears skin.  Please use "paper" tape  . Metformin And Related Diarrhea and Other (See Comments)    Abnormal Kidney Functions   . Tradjenta [Linagliptin] Other (See Comments)    Low back ache, resolved once medication stopped   . Penicillins Hives and Other (See Comments)    Tolerates Ceftriaxone, Has patient had a PCN reaction causing immediate rash, facial/tongue/throat swelling, SOB or lightheadedness with hypotension: Unknown Has patient had a PCN reaction causing severe rash involving mucus membranes or skin necrosis: No Has patient had a PCN reaction that required hospitalization No Has patient had a PCN reaction occurring within the last 10 years: No If all of the above answers are "NO", then may proceed with Cephalosporin use.        Procedures/Studies: Dg Chest 2 View  Result Date: 01/03/2018 CLINICAL DATA:  84 y/o F; fall  at nursing home this a.m., shortness of breath, abrasion to olecranon area of the elbow. EXAM: CHEST - 2 VIEW COMPARISON:  08/24/2017 chest radiograph FINDINGS: Stable cardiomegaly given projection and technique. Aortic atherosclerosis with calcification. Reticular opacities of the lungs. Patchy consolidation in the right lung base. No pleural effusion or pneumothorax. Chronic appearing right anterolateral rib fractures. No acute osseous abnormality is evident. IMPRESSION: 1. Patchy consolidation in the right lung base may represent pneumonia or atelectasis. 2. Reticular opacities of the lungs probably representing interstitial edema. 3. Stable cardiomegaly. 4.  Aortic Atherosclerosis (ICD10-I70.0). Electronically Signed   By: Mitzi Hansen M.D.    On: 01/03/2018 03:47   Dg Elbow Complete Right  Result Date: 01/03/2018 CLINICAL DATA:  Patient fell in nursing home this morning. Shortness of breath. Abrasion to the olecranon area. EXAM: RIGHT ELBOW - COMPLETE 3+ VIEW COMPARISON:  None. FINDINGS: Diffuse bone demineralization. No significant effusion. No evidence of acute fracture or subluxation. No focal bone lesion or bone destruction. Bone cortex and trabecular architecture appear intact. No radiopaque soft tissue foreign bodies. IMPRESSION: No acute bony abnormalities. Electronically Signed   By: Burman Nieves M.D.   On: 01/03/2018 03:37   Ct Head Wo Contrast  Result Date: 01/03/2018 CLINICAL DATA:  Fall with laceration to the right eyebrow EXAM: CT HEAD WITHOUT CONTRAST CT CERVICAL SPINE WITHOUT CONTRAST TECHNIQUE: Multidetector CT imaging of the head and cervical spine was performed following the standard protocol without intravenous contrast. Multiplanar CT image reconstructions of the cervical spine were also generated. COMPARISON:  CT brain 06/12/2015, MRI brain 09/05/2009 FINDINGS: CT HEAD FINDINGS Brain: No acute territorial infarction, hemorrhage or intracranial mass is visualized. Atrophy and small vessel ischemic changes of the white matter. Stable prominent ventricles. Vascular: No hyperdense vessels. Vertebral and carotid vascular calcification Skull: Normal. Negative for fracture or focal lesion. Sinuses/Orbits: No acute finding. Postsurgical changes of the anterior maxillary sinuses. Other: None CT CERVICAL SPINE FINDINGS Alignment: No subluxation.  Facet alignment within normal limits. Skull base and vertebrae: No acute fracture. No primary bone lesion or focal pathologic process. Soft tissues and spinal canal: No prevertebral fluid or swelling. No visible canal hematoma. Disc levels: Mild degenerative changes at C4-C5, C5-C6, C6-C7. Normal facet degenerative change. Upper chest: Apical emphysema. Other: Plan IMPRESSION: 1. No CT  evidence for acute intracranial abnormality. Atrophy and small vessel ischemic changes the matter 2. No acute osseous abnormality of the cervical spine Evidence for acute intracranial abnormality. Atrophy and small vessel ischemic changes of the white matter Electronically Signed   By: Jasmine Pang M.D.   On: 01/03/2018 03:54   Ct Cervical Spine Wo Contrast  Result Date: 01/03/2018 CLINICAL DATA:  Fall with laceration to the right eyebrow EXAM: CT HEAD WITHOUT CONTRAST CT CERVICAL SPINE WITHOUT CONTRAST TECHNIQUE: Multidetector CT imaging of the head and cervical spine was performed following the standard protocol without intravenous contrast. Multiplanar CT image reconstructions of the cervical spine were also generated. COMPARISON:  CT brain 06/12/2015, MRI brain 09/05/2009 FINDINGS: CT HEAD FINDINGS Brain: No acute territorial infarction, hemorrhage or intracranial mass is visualized. Atrophy and small vessel ischemic changes of the white matter. Stable prominent ventricles. Vascular: No hyperdense vessels. Vertebral and carotid vascular calcification Skull: Normal. Negative for fracture or focal lesion. Sinuses/Orbits: No acute finding. Postsurgical changes of the anterior maxillary sinuses. Other: None CT CERVICAL SPINE FINDINGS Alignment: No subluxation.  Facet alignment within normal limits. Skull base and vertebrae: No acute fracture. No primary bone lesion or focal pathologic  process. Soft tissues and spinal canal: No prevertebral fluid or swelling. No visible canal hematoma. Disc levels: Mild degenerative changes at C4-C5, C5-C6, C6-C7. Normal facet degenerative change. Upper chest: Apical emphysema. Other: Plan IMPRESSION: 1. No CT evidence for acute intracranial abnormality. Atrophy and small vessel ischemic changes the matter 2. No acute osseous abnormality of the cervical spine Evidence for acute intracranial abnormality. Atrophy and small vessel ischemic changes of the white matter  Electronically Signed   By: Jasmine Pang M.D.   On: 01/03/2018 03:54      Subjective: Patient is more alert today.  Appears to be at baseline  Discharge Exam: Vitals:   01/04/18 2046 01/05/18 0518  BP: (!) 142/53 (!) 181/66  Pulse: 70 71  Resp: 18 18  Temp: 98.3 F (36.8 C) 98.3 F (36.8 C)  SpO2: 95% 99%   Vitals:   01/04/18 1519 01/04/18 1630 01/04/18 2046 01/05/18 0518  BP:   (!) 142/53 (!) 181/66  Pulse:   70 71  Resp:   18 18  Temp: (!) 95.1 F (35.1 C) (!) 95.5 F (35.3 C) 98.3 F (36.8 C) 98.3 F (36.8 C)  TempSrc: Rectal Rectal    SpO2:   95% 99%  Weight:      Height:        General: Awake to commands, minimally communicative (baseline) HEENT:  moist mucosa, supple neck Chest:  Improved breath sounds over lung bases bilaterally CVS: N S1&S2, no murmurs, GI: soft, NT, ND, BS+ Musculoskeletal: warm, no edema CNS: AAO x0.   The results of significant diagnostics from this hospitalization (including imaging, microbiology, ancillary and laboratory) are listed below for reference.     Microbiology: Recent Results (from the past 240 hour(s))  Culture, blood (routine x 2) Call MD if unable to obtain prior to antibiotics being given     Status: None (Preliminary result)   Collection Time: 01/03/18  9:25 AM  Result Value Ref Range Status   Specimen Description   Final    BLOOD LEFT HAND Performed at [facility], [address] [place], [place] [address]    Special Requests   Final    BOTTLES DRAWN AEROBIC ONLY Blood Culture results may not be optimal due to an inadequate volume of blood received in culture bottles Performed at [facility], [address] [place], [place] [address]    Culture   Final    NO GROWTH 2 DAYS Performed at [facility], [address] [place], [place] [address]    Report Status PENDING  Incomplete  Culture, blood (routine x 2) Call MD if unable to obtain prior to antibiotics  being given     Status: None (Preliminary result)   Collection Time: 01/03/18  9:25 AM  Result Value Ref Range Status   Specimen Description   Final    BLOOD RIGHT HAND Performed at [facility], [address] [place], [place] [address]    Special Requests   Final    BOTTLES DRAWN AEROBIC ONLY Blood Culture results may not be optimal due to an inadequate volume of blood received in culture bottles Performed at [facility], [address], [place], [place] [address]    Culture   Final    NO GROWTH 2 DAYS Performed at [facility], [address] [place], [place] [address]    Report Status PENDING  Incomplete  MRSA PCR Screening     Status: None  Collection Time: 01/03/18  5:56 PM  Result Value Ref Range Status   MRSA by PCR NEGATIVE NEGATIVE Final    Comment:        The GeneXpert MRSA Assay (FDA approved for NASAL specimens only), is one component of a comprehensive MRSA colonization surveillance program. It is not intended to diagnose MRSA infection nor to guide or monitor treatment for MRSA infections. Performed at [facility], [address] [place], [place] [address]      Labs: BNP (last 3 results) Recent Labs    03/31/17 0208 04/01/17 0450 01/03/18 0353  BNP 1,499.8* 783.3* 1,942.9*   Basic Metabolic Panel: Recent Labs  Lab 01/03/18 0353 01/04/18 0422 01/05/18 0349  NA 143 143 143  K 4.0 4.1 5.1  CL 109 109 107  CO2 23 25 25   GLUCOSE 165* 124* 150*  BUN 47* 36* 60*  CREATININE 1.90* 1.53* 2.01*  CALCIUM 9.0 8.7* 8.5*   Liver Function Tests: Recent Labs  Lab 01/04/18 0422  AST 17  ALT 9  ALKPHOS 61  BILITOT 0.8  PROT 5.9*  ALBUMIN 3.1*   No results for input(s): LIPASE, AMYLASE in the last 168 hours. No results for input(s): AMMONIA in the last 168 hours. CBC: Recent Labs  Lab 01/03/18 0353 01/04/18 0422  WBC 9.4 6.1  NEUTROABS 7.6  --   HGB 9.9* 9.3*  HCT 32.0*  30.9*  MCV 96.7 97.8  PLT 221 204   Cardiac Enzymes: Recent Labs  Lab 01/03/18 0353 01/03/18 1616 01/03/18 2137 01/04/18 0422  TROPONINI 0.05* 0.04* 0.04* 0.04*   BNP: Invalid input(s): POCBNP CBG: Recent Labs  Lab 01/04/18 1214 01/04/18 1641 01/04/18 1644 01/04/18 2202 01/05/18 0750  GLUCAP 85 219* 212* 122* 126*   D-Dimer No results for input(s): DDIMER in the last 72 hours. Hgb A1c No results for input(s): HGBA1C in the last 72 hours. Lipid Profile No results for input(s): CHOL, HDL, LDLCALC, TRIG, CHOLHDL, LDLDIRECT in the last 72 hours. Thyroid function studies No results for input(s): TSH, T4TOTAL, T3FREE, THYROIDAB in the last 72 hours.  Invalid input(s): FREET3 Anemia work up No results for input(s): VITAMINB12, FOLATE, FERRITIN, TIBC, IRON, RETICCTPCT in the last 72 hours. Urinalysis    Component Value Date/Time   COLORURINE YELLOW 08/24/2017 2026   APPEARANCEUR CLEAR 08/24/2017 2026   APPEARANCEUR Clear 02/21/2013 1622   LABSPEC 1.008 08/24/2017 2026   PHURINE 6.0 08/24/2017 2026   GLUCOSEU NEGATIVE 08/24/2017 2026   HGBUR NEGATIVE 08/24/2017 2026   BILIRUBINUR NEGATIVE 08/24/2017 2026   BILIRUBINUR Negative 02/21/2013 1622   KETONESUR NEGATIVE 08/24/2017 2026   PROTEINUR 100 (A) 08/24/2017 2026   UROBILINOGEN 0.2 12/23/2014 1513   NITRITE NEGATIVE 08/24/2017 2026   LEUKOCYTESUR NEGATIVE 08/24/2017 2026   LEUKOCYTESUR Negative 02/21/2013 1622   Sepsis Labs Invalid input(s): PROCALCITONIN,  WBC,  LACTICIDVEN Microbiology Recent Results (from the past 240 hour(s))  Culture, blood (routine x 2) Call MD if unable to obtain prior to antibiotics being given     Status: None (Preliminary result)   Collection Time: 01/03/18  9:25 AM  Result Value Ref Range Status   Specimen Description   Final    BLOOD LEFT HAND Performed at [facility], [address], [place], [place] [address]    Special Requests   Final    BOTTLES DRAWN  AEROBIC ONLY Blood Culture results may not be optimal due to an inadequate volume of blood received in culture bottles Performed at [facility], [address], [place], [place] [address]    Culture   Final    NO GROWTH 2 DAYS Performed at [facility], [address], [place], [place] [address]    Report Status PENDING  Incomplete  Culture, blood (routine x 2) Call MD if unable to obtain prior to antibiotics being given     Status: None (Preliminary result)   Collection Time: 01/03/18  9:25 AM  Result Value Ref Range Status   Specimen Description   Final    BLOOD RIGHT HAND Performed at [facility], [address], [place], [place] [address]    Special Requests   Final    BOTTLES DRAWN AEROBIC ONLY Blood Culture results may not be optimal due to an inadequate volume of blood received in culture bottles Performed at [facility], [address], [place], [place] [address]    Culture   Final    NO GROWTH 2 DAYS Performed at [facility], [address] [place], [place] [address]    Report Status PENDING  Incomplete  MRSA PCR Screening     Status: None   Collection Time: 01/03/18  5:56 PM  Result Value Ref Range Status   MRSA by PCR NEGATIVE NEGATIVE Final    Comment:        The GeneXpert MRSA Assay (FDA approved for NASAL specimens only), is one component of a comprehensive MRSA colonization surveillance program. It is not intended to diagnose MRSA infection nor to guide or monitor treatment for MRSA infections. Performed at [facility], [address], [place], [place] [address]      Time coordinating discharge: 35 minutes  SIGNED:   Eddie North, MD  [facility] 01/05/2018, 10:51 AM Pager   If 7PM-7AM, please contact night-coverage [email or web address] Password [email or web address]

## 2018-01-05 NOTE — Progress Notes (Signed)
CSW following to assist with discharge planning back to [facility].  CSW contacted [facility] and spoke with staff member Elease Hashimoto. Staff agreed to review patient's clinical information and call CSW back.  CSW contacted by [facility] staff member Elease Hashimoto who confirmed receipt of requested clinical documents and patient's ability to return.  CSW contacted patient's daughter [name] [phone]), no answer. CSW left voicemail requesting return phone call.  CSW contacted patient's daughter Ruthe Mannan [phone]), no answer nor option to leave voicemail.  CSW contacted patient's daughter's husband (Mr. Jeannetta Nap [phone]) and informed him that patient is ready for discharge back to [facility]. Patient's daughter's husband reported that patient will need ambulance to transport back to ALF. Patient's daughter's husband reported that he will notify his wife [name]) if he talks to her.   CSW contact [facility] RN Wallis Bamberg) to see if she was able to reach patient's daughters. [facility] RN reported that she was unable to reach patient's daughters today and left a voicemail regarding patient's discharge.   Patient ready for discharge. Facility aware of patient's discharge and confirmed patient's ability to return. CSW was unable to reach patient's daughters but was able to reach patient's daughter's husband (Mr. Jeannetta Nap) who confirmed patient's return to ALF via ambulance. [facility] contacted, patient's daughter's husband notified. Patient's RN can call report to [phone], packet complete. CSW signing off, no other needs identified at this time.  Celso Sickle, [place] Clinical Social Worker [facility] Cell#: [phone]

## 2018-01-05 NOTE — Progress Notes (Signed)
CSW consulted for assistance with discharge planning, patient admitted from [facility] ([facility] following). Patient oriented x person and unable to participate in assessment. CSW contacted patient's daughter [name] [phone]), no answer. CSW left voicemail requesting return phone call. CSW contacted patient's daughter Ruthe Mannan [phone]), no answer nor option to leave a voicemail. Per chart review, patient to return back to [facility] for LTC when medically stable.  CSW awaiting return call from patient's daughter.  Celso Sickle, [place] Clinical Social Worker [facility] Cell#: [phone]

## 2018-01-05 NOTE — [place] FL2 (Addendum)
[place] MEDICAID FL2 LEVEL OF CARE SCREENING TOOL     IDENTIFICATION  Patient Name: [name] Birthdate: Jul 23, 1927 Sex: female Admission Date (Current Location): 01/03/2018  [facility] and [place] Number:  Producer, television/film/video and Address:  [facility],  [address], [place] [address]      Provider Number: [id number]  Attending Physician Name and Address:  Eddie North, MD  Relative Name and Phone Number:       Current Level of Care: Hospital Recommended Level of Care: Assisted Living Facility Prior Approval Number:    Date Approved/Denied:   PASRR Number:    Discharge Plan: Other (Comment)(Assisted living facility)    Current Diagnoses: Patient Active Problem List   Diagnosis Date Noted  . Lobar pneumonia, unspecified organism (HCC) 01/05/2018  . Acute respiratory failure with hypoxia (HCC) 01/05/2018  . Pneumonia 01/03/2018  . Dementia with behavioral disturbance (HCC)   . Palliative care by specialist   . Advance care planning   . Goals of care, counseling/discussion   . Protein-calorie malnutrition, severe 04/01/2017  . Respiratory failure (HCC) 03/31/2017  . Pressure injury of skin 03/21/2017  . Acute renal failure superimposed on stage 3 chronic kidney disease (HCC) 03/20/2017  . Acute on chronic combined systolic (congestive) and diastolic (congestive) heart failure (HCC) 03/20/2017  . CHF (congestive heart failure) (HCC) 03/09/2017  . Acute kidney injury superimposed on CKD (HCC)stage III 02/07/2017  . Acute on chronic combined systolic and diastolic CHF (congestive heart failure) (HCC) 02/05/2017  . Urinary tract infection without hematuria   . Hypoxia 02/04/2017  . Abnormal CXR 02/04/2017  . Accelerated hypertension 02/04/2017  . LBBB (left bundle branch block) 02/04/2017  . Diarrhea 12/09/2016  . Delusions (HCC) 09/28/2016  . Medication noncompliance due to cognitive impairment 09/28/2016  . Hemorrhoids 05/04/2016  . Pain  in joint, lower leg 01/20/2016  . Ptosis 11/04/2015  . Glaucoma 08/25/2015  . Dementia without behavioral disturbance (HCC) 06/10/2015  . Expressive aphasia 05/26/2015  . Rectal prolapse 02/25/2015  . CKD (chronic kidney disease), stage III (HCC) 02/10/2014  . PAD (peripheral artery disease) (HCC) 01/17/2014  . Constipation 07/31/2013  . Atherosclerosis of native artery of extremity with intermittent claudication (HCC) 07/04/2013  . Anxiety disorder 02/21/2013  . Diabetes mellitus with nephropathy (HCC) 07/05/2012  . Occlusion and stenosis of carotid artery without mention of cerebral infarction 06/21/2012  . Coronary artery disease   . Chronic diastolic heart failure, NYHA class 1 (HCC)   . Hyperlipidemia   . Hypertension   . Carotid bruit   . Gout   . Obesity   . TIA (transient ischemic attack)     Orientation RESPIRATION BLADDER Height & Weight     Self  O2 Incontinent Weight: 106 lb 4.2 oz (48.2 kg) Height:  5\' 2"  (157.5 cm)  BEHAVIORAL SYMPTOMS/MOOD NEUROLOGICAL BOWEL NUTRITION STATUS        Diet(Soft)  AMBULATORY STATUS COMMUNICATION OF NEEDS Skin   Total Care Verbally Other (Comment)( Wound/Incision(OpenorDehisced)10/16/19Other(Comment)ElbowRightskintear Foam Dressing)                       Personal Care Assistance Level of Assistance  Bathing, Feeding, Dressing Bathing Assistance: Maximum assistance Feeding assistance: Maximum assistance Dressing Assistance: Maximum assistance     Functional Limitations Info  Sight, Hearing, Speech Sight Info: Impaired Hearing Info: Adequate Speech Info: Adequate    SPECIAL CARE FACTORS FREQUENCY  Contractures      Additional Factors Info  Code Status, Allergies Code Status Info: DNR Allergies Info: Percocet Oxycodone-acetaminophen;Sulfa Antibiotics;Adhesive Tape;Metformin And Related;Penicillins;Namzaric Memantine Hcl-donepezil Hcl;Tradjenta Linagliptin               Discharge Medications: Medication List    STOP taking these medications   aspirin EC 81 MG tablet   cephALEXin 500 MG capsule Commonly known as:  KEFLEX   ciprofloxacin 250 MG tablet Commonly known as:  CIPRO   docusate sodium 100 MG capsule Commonly known as:  COLACE   ezetimibe-simvastatin 10-20 MG tablet Commonly known as:  VYTORIN   OLANZapine zydis 5 MG disintegrating tablet Commonly known as:  ZYPREXA     TAKE these medications   BIDIL 20-37.5 MG tablet Generic drug:  isosorbide-hydrALAZINE TAKE 1 TABLET BY MOUTH TWICE DAILY   cloNIDine 0.1 MG tablet Commonly known as:  CATAPRES Take 1 tablet (0.1 mg total) by mouth 2 (two) times daily.   doxycycline 100 MG tablet Commonly known as:  VIBRA-TABS Take 1 tablet (100 mg total) by mouth 2 (two) times daily for 3 days.   feeding supplement (ENSURE ENLIVE) Liqd Take 237 mLs by mouth 2 (two) times daily between meals.   furosemide 40 MG tablet Commonly known as:  LASIX Take 40 mg by mouth daily. What changed:  Another medication with the same name was removed. Continue taking this medication, and follow the directions you see here.   haloperidol 2 MG/ML solution Commonly known as:  HALDOL Take 5 mg by mouth daily.   hydrocortisone 2.5 % rectal cream Commonly known as:  ANUSOL-HC APPLY TO RECTUM 3 TIMES DAILY FOR DISCOMFORT   LOTEMAX 0.5 % Gel Generic drug:  Loteprednol Etabonate PLACE 1 DROP IN THE LEFT EYE TWICE DAILY What changed:  See the new instructions.   nebivolol 10 MG tablet Commonly known as:  BYSTOLIC Take 10 mg by mouth daily. What changed:  Another medication with the same name was removed. Continue taking this medication, and follow the directions you see here.   nitroGLYCERIN 0.4 MG SL tablet Commonly known as:  NITROSTAT Place one under tongue for chest pain, up to 3 tablets   oxyCODONE 5 MG immediate release tablet Commonly known as:  Oxy IR/ROXICODONE Take  5 mg by mouth every 4 (four) hours as needed for severe pain.   polyethylene glycol powder powder Commonly known as:  GLYCOLAX/MIRALAX Take 17 g by mouth daily.   timolol 0.5 % ophthalmic solution Commonly known as:  BETIMOL INSTILL 1 DROP INTO THE LEFT EYE DAILY     Relevant Imaging Results:  Relevant Lab Results:   Additional Information SSN [id number]  Hospice Services Following  Antionette Poles, [place]

## 2018-01-06 ENCOUNTER — Emergency Department (HOSPITAL_COMMUNITY)

## 2018-01-06 ENCOUNTER — Encounter (HOSPITAL_COMMUNITY): Payer: Self-pay | Admitting: Emergency Medicine

## 2018-01-06 ENCOUNTER — Emergency Department (HOSPITAL_COMMUNITY)
Admission: EM | Admit: 2018-01-06 | Discharge: 2018-01-06 | Disposition: A | Discharge status: Home / Self Care | Attending: Emergency Medicine | Admitting: Emergency Medicine

## 2018-01-06 DIAGNOSIS — S51012A Laceration without foreign body of left elbow, initial encounter: Secondary | ICD-10-CM | POA: Insufficient documentation

## 2018-01-06 DIAGNOSIS — Z79899 Other long term (current) drug therapy: Secondary | ICD-10-CM | POA: Insufficient documentation

## 2018-01-06 DIAGNOSIS — W19XXXA Unspecified fall, initial encounter: Secondary | ICD-10-CM | POA: Diagnosis not present

## 2018-01-06 DIAGNOSIS — Y92129 Unspecified place in nursing home as the place of occurrence of the external cause: Secondary | ICD-10-CM | POA: Diagnosis not present

## 2018-01-06 DIAGNOSIS — I2510 Atherosclerotic heart disease of native coronary artery without angina pectoris: Secondary | ICD-10-CM | POA: Diagnosis not present

## 2018-01-06 DIAGNOSIS — Y939 Activity, unspecified: Secondary | ICD-10-CM | POA: Insufficient documentation

## 2018-01-06 DIAGNOSIS — S0081XA Abrasion of other part of head, initial encounter: Secondary | ICD-10-CM | POA: Diagnosis not present

## 2018-01-06 DIAGNOSIS — E1122 Type 2 diabetes mellitus with diabetic chronic kidney disease: Secondary | ICD-10-CM | POA: Diagnosis not present

## 2018-01-06 DIAGNOSIS — I130 Hypertensive heart and chronic kidney disease with heart failure and stage 1 through stage 4 chronic kidney disease, or unspecified chronic kidney disease: Secondary | ICD-10-CM | POA: Insufficient documentation

## 2018-01-06 DIAGNOSIS — N183 Chronic kidney disease, stage 3 (moderate): Secondary | ICD-10-CM | POA: Diagnosis not present

## 2018-01-06 DIAGNOSIS — S0990XA Unspecified injury of head, initial encounter: Secondary | ICD-10-CM | POA: Diagnosis present

## 2018-01-06 DIAGNOSIS — F0390 Unspecified dementia without behavioral disturbance: Secondary | ICD-10-CM | POA: Diagnosis not present

## 2018-01-06 DIAGNOSIS — Y999 Unspecified external cause status: Secondary | ICD-10-CM | POA: Insufficient documentation

## 2018-01-06 DIAGNOSIS — I5043 Acute on chronic combined systolic (congestive) and diastolic (congestive) heart failure: Secondary | ICD-10-CM | POA: Diagnosis not present

## 2018-01-06 DIAGNOSIS — Z87891 Personal history of nicotine dependence: Secondary | ICD-10-CM | POA: Diagnosis not present

## 2018-01-06 NOTE — ED Notes (Signed)
Pt transported to CT ?

## 2018-01-06 NOTE — ED Notes (Signed)
[facility] arrived at bedside.  

## 2018-01-06 NOTE — ED Triage Notes (Signed)
Pt BIB [facility] from [facility] after a fall tonight. Unknown when pt fell, abrasion and hematoma to forehead, and skin tear to left elbow. Hx dementia, unknown baseline per SNF staff, alert to self and situation at this time.

## 2018-01-06 NOTE — ED Notes (Signed)
Attempted report to [facility] x2, unable to obtain contact with nurse to give report

## 2018-01-06 NOTE — ED Provider Notes (Signed)
[facility] EMERGENCY DEPARTMENT Provider Note   CSN: [id number] Arrival date & time: 01/06/18  0136     History   Chief Complaint Chief Complaint  Patient presents with  . Fall    HPI DENNIS KILLILEA is a 84 y.o. female.  The history is provided by the EMS personnel. The history is limited by the condition of the patient (Dementia).  She has history of dementia, coronary artery disease, chronic kidney disease, diastolic heart failure, hypertension, hyperlipidemia, diabetes, stroke who comes in following an unwitnessed fall.  She was observed to have an abrasion to her forehead and skin tear to her left elbow.  She is not able to give any relevant history.  Past Medical History:  Diagnosis Date  . Anemia   . Anxiety disorder   . Carotid artery occlusion   . Carotid bruit   . Chronic kidney disease (CKD), stage III (moderate) (HCC)   . Coronary artery disease    Remote PCI. Had BMS to RCA and DES stent to Diagonal in Jan. 2012   . Diastolic heart failure    EF 55 to 60% per cath in Jan 2012  . DVT (deep venous thrombosis) (HCC)   . Gout   . Hyperlipidemia   . Hypertension   . Ischemic cardiomyopathy    with prior EF of 35%  . NSTEMI (non-ST elevated myocardial infarction) [facility]) Jan 2012   with PCI to RCA and DX per Dr. Excell Seltzer  . Obesity   . Pneumonia 2016  . Proteinuria   . Stroke [facility]) 2013   ?  mini    . TIA (transient ischemic attack)   . Type II diabetes mellitus (HCC)    long standing    Patient Active Problem List   Diagnosis Date Noted  . Lobar pneumonia, unspecified organism (HCC) 01/05/2018  . Acute respiratory failure with hypoxia (HCC) 01/05/2018  . Pneumonia 01/03/2018  . Dementia with behavioral disturbance (HCC)   . Palliative care by specialist   . Advance care planning   . Goals of care, counseling/discussion   . Protein-calorie malnutrition, severe 04/01/2017  . Respiratory failure (HCC) 03/31/2017  . Pressure injury of  skin 03/21/2017  . Acute renal failure superimposed on stage 3 chronic kidney disease (HCC) 03/20/2017  . Acute on chronic combined systolic (congestive) and diastolic (congestive) heart failure (HCC) 03/20/2017  . CHF (congestive heart failure) (HCC) 03/09/2017  . Acute kidney injury superimposed on CKD (HCC)stage III 02/07/2017  . Acute on chronic combined systolic and diastolic CHF (congestive heart failure) (HCC) 02/05/2017  . Urinary tract infection without hematuria   . Hypoxia 02/04/2017  . Abnormal CXR 02/04/2017  . Accelerated hypertension 02/04/2017  . LBBB (left bundle branch block) 02/04/2017  . Diarrhea 12/09/2016  . Delusions (HCC) 09/28/2016  . Medication noncompliance due to cognitive impairment 09/28/2016  . Hemorrhoids 05/04/2016  . Pain in joint, lower leg 01/20/2016  . Ptosis 11/04/2015  . Glaucoma 08/25/2015  . Dementia without behavioral disturbance (HCC) 06/10/2015  . Expressive aphasia 05/26/2015  . Rectal prolapse 02/25/2015  . CKD (chronic kidney disease), stage III (HCC) 02/10/2014  . PAD (peripheral artery disease) (HCC) 01/17/2014  . Constipation 07/31/2013  . Atherosclerosis of native artery of extremity with intermittent claudication (HCC) 07/04/2013  . Anxiety disorder 02/21/2013  . Diabetes mellitus with nephropathy (HCC) 07/05/2012  . Occlusion and stenosis of carotid artery without mention of cerebral infarction 06/21/2012  . Coronary artery disease   . Chronic diastolic heart  failure, NYHA class 1 (HCC)   . Hyperlipidemia   . Hypertension   . Carotid bruit   . Gout   . Obesity   . TIA (transient ischemic attack)     Past Surgical History:  Procedure Laterality Date  . ABDOMINAL AORTAGRAM N/A 01/17/2014   Procedure: ABDOMINAL Ronny Flurry;  Surgeon: Sherren Kerns, MD;  Location: [facility] CATH LAB;  Service: Cardiovascular;  Laterality: N/A;  . ANKLE FUSION Bilateral   . APPENDECTOMY    . CATARACT EXTRACTION, BILATERAL Bilateral   . CORONARY  ANGIOPLASTY WITH STENT PLACEMENT  04/05/2010   distal RCA  . FRACTURE SURGERY Right 2005   bilateral ankles   . TONSILLECTOMY       OB History   None      Home Medications    Prior to Admission medications   Medication Sig Start Date End Date Taking? Authorizing Provider  BIDIL 20-37.5 MG tablet TAKE 1 TABLET BY MOUTH TWICE DAILY 05/03/17   Kirt Boys, DO  cloNIDine (CATAPRES) 0.1 MG tablet Take 1 tablet (0.1 mg total) by mouth 2 (two) times daily. 03/22/17   Marinda Elk, MD  doxycycline (VIBRA-TABS) 100 MG tablet Take 1 tablet (100 mg total) by mouth 2 (two) times daily for 3 days. 01/05/18 01/08/18  Dhungel, Theda Belfast, MD  feeding supplement, ENSURE ENLIVE, (ENSURE ENLIVE) LIQD Take 237 mLs by mouth 2 (two) times daily between meals. Patient not taking: Reported on 01/03/2018 12/11/16   Albertine Grates, MD  furosemide (LASIX) 40 MG tablet Take 40 mg by mouth daily.    [provider]  haloperidol (HALDOL) 2 MG/ML solution Take 5 mg by mouth daily.    [provider]  hydrocortisone (PROCTOZONE-HC) 2.5 % rectal cream APPLY TO RECTUM 3 TIMES DAILY FOR DISCOMFORT 08/02/17   Kirt Boys, DO  LOTEMAX 0.5 % GEL PLACE 1 DROP IN THE LEFT EYE TWICE DAILY Patient taking differently: Place 1 drop into the left eye 2 (two) times daily.  03/24/17   Kirt Boys, DO  nebivolol (BYSTOLIC) 10 MG tablet Take 10 mg by mouth daily.    [provider]  nitroGLYCERIN (NITROSTAT) 0.4 MG SL tablet Place one under tongue for chest pain, up to 3 tablets 03/24/17   Kirt Boys, DO  oxyCODONE (OXY IR/ROXICODONE) 5 MG immediate release tablet Take 5 mg by mouth every 4 (four) hours as needed for severe pain.    [provider]  polyethylene glycol powder (GLYCOLAX/MIRALAX) powder Take 17 g by mouth daily.  01/16/17   [provider]  timolol (BETIMOL) 0.5 % ophthalmic solution INSTILL 1 DROP INTO THE LEFT EYE DAILY 03/24/17   Kirt Boys, DO    Family  History Family History  Problem Relation Age of Onset  . Diabetes Daughter   . CAD Neg Hx     Social History Social History   Tobacco Use  . Smoking status: Former Games developer  . Smokeless tobacco: Never Used  . Tobacco comment: "smoked in my teens; quit after 1 year or so"  Substance Use Topics  . Alcohol use: No    Alcohol/week: 0.0 standard drinks  . Drug use: No     Allergies   Namzaric [memantine hcl-donepezil hcl]; Percocet [oxycodone-acetaminophen]; Sulfa antibiotics; Adhesive [tape]; Metformin and related; Tradjenta [linagliptin]; and Penicillins   Review of Systems Review of Systems  Unable to perform ROS: Dementia     Physical Exam Updated Vital Signs BP (!) 161/75 (BP Location: Left Wrist)   Pulse 75  Temp 98.2 F (36.8 C) (Oral)   Resp 18   SpO2 95%   Physical Exam  Nursing note and vitals reviewed.  84 year old female, resting comfortably and in no acute distress. Vital signs are significant for elevated systolic blood pressure. Oxygen saturation is 95%, which is normal. Head is normocephalic.  Abrasion present on the forehead. PERRLA, EOMI. Oropharynx is clear. Neck is immobilized in a towel roll, and is nontender without adenopathy or JVD. Back is nontender and there is no CVA tenderness. Lungs are clear without rales, wheezes, or rhonchi. Chest is nontender. Heart has regular rate and rhythm without murmur. Abdomen is soft, flat, nontender without masses or hepatosplenomegaly and peristalsis is normoactive. Extremities have no cyanosis or edema, full range of motion is present.  Skin tear of left elbow noted.  No other extremity injuries seen. Skin is warm and dry without rash. Neurologic: Awake and alert and oriented to person and place but not time, cranial nerves are intact, there are no motor or sensory deficits.  ED Treatments / Results   Radiology Ct Head Wo Contrast  Result Date: 01/06/2018 CLINICAL DATA:  Fall tonight.  Forehead  hematoma. EXAM: CT HEAD WITHOUT CONTRAST CT CERVICAL SPINE WITHOUT CONTRAST TECHNIQUE: Multidetector CT imaging of the head and cervical spine was performed following the standard protocol without intravenous contrast. Multiplanar CT image reconstructions of the cervical spine were also generated. COMPARISON:  Head and cervical spine CT 3 days prior 01/03/2018 FINDINGS: CT HEAD FINDINGS Mild/moderate motion artifact. Brain: Again seen atrophy and chronic small vessel ischemia. No intracranial hemorrhage, mass effect, or midline shift. No hydrocephalus. The basilar cisterns are patent. No evidence of territorial infarct or acute ischemia. No extra-axial or intracranial fluid collection. Vascular: Atherosclerosis of skull base vasculature without hyperdense vessel. Skull: No fracture or focal lesion. Sinuses/Orbits: Left frontal scalp hematoma. No visualized fracture. Paranasal sinuses are clear. Other: None. CT CERVICAL SPINE FINDINGS Alignment: Unchanged minimal anterolisthesis of C6 on C7. No traumatic subluxation. Posterior elements are well aligned. Skull base and vertebrae: No acute fracture. Vertebral body heights are maintained. The dens and skull base are intact. Soft tissues and spinal canal: No prevertebral fluid or swelling. No visible canal hematoma. Disc levels: Mild endplate spurring and facet arthropathy, unchanged from prior exam. Upper chest: No acute finding. Other: Carotid calcifications. IMPRESSION: 1. Left frontal scalp hematoma. No acute intracranial abnormality. No skull fracture. 2. Degenerative change in the cervical spine without acute fracture or subluxation. 3. Stable brain atrophy and chronic small vessel ischemia. Electronically Signed   By: Narda Rutherford M.D.   On: 01/06/2018 03:07   Ct Cervical Spine Wo Contrast  Result Date: 01/06/2018 CLINICAL DATA:  Fall tonight.  Forehead hematoma. EXAM: CT HEAD WITHOUT CONTRAST CT CERVICAL SPINE WITHOUT CONTRAST TECHNIQUE: Multidetector CT  imaging of the head and cervical spine was performed following the standard protocol without intravenous contrast. Multiplanar CT image reconstructions of the cervical spine were also generated. COMPARISON:  Head and cervical spine CT 3 days prior 01/03/2018 FINDINGS: CT HEAD FINDINGS Mild/moderate motion artifact. Brain: Again seen atrophy and chronic small vessel ischemia. No intracranial hemorrhage, mass effect, or midline shift. No hydrocephalus. The basilar cisterns are patent. No evidence of territorial infarct or acute ischemia. No extra-axial or intracranial fluid collection. Vascular: Atherosclerosis of skull base vasculature without hyperdense vessel. Skull: No fracture or focal lesion. Sinuses/Orbits: Left frontal scalp hematoma. No visualized fracture. Paranasal sinuses are clear. Other: None. CT CERVICAL SPINE FINDINGS Alignment: Unchanged minimal  anterolisthesis of C6 on C7. No traumatic subluxation. Posterior elements are well aligned. Skull base and vertebrae: No acute fracture. Vertebral body heights are maintained. The dens and skull base are intact. Soft tissues and spinal canal: No prevertebral fluid or swelling. No visible canal hematoma. Disc levels: Mild endplate spurring and facet arthropathy, unchanged from prior exam. Upper chest: No acute finding. Other: Carotid calcifications. IMPRESSION: 1. Left frontal scalp hematoma. No acute intracranial abnormality. No skull fracture. 2. Degenerative change in the cervical spine without acute fracture or subluxation. 3. Stable brain atrophy and chronic small vessel ischemia. Electronically Signed   By: Narda Rutherford M.D.   On: 01/06/2018 03:07    Procedures Procedures   Medications Ordered in ED Medications - No data to display   Initial Impression / Assessment and Plan / ED Course  I have reviewed the triage vital signs and the nursing notes.  Pertinent imaging results that were available during my care of the patient were reviewed  by me and considered in my medical decision making (see chart for details).  Fall with abrasion to forehead and skin tear left elbow.  She will be sent for CT of head and cervical spine.  Old records are reviewed, and she has no relevant past visits.  She received Tdap immunization this year.  CT scans showed no intracranial injury, no C-spine injury.  She is discharged back to her skilled nursing facility with a contusion, abrasion, skin tear instructions.  Final Clinical Impressions(s) / ED Diagnoses   Final diagnoses:  Fall at nursing home, initial encounter  Abrasion of forehead, initial encounter  Skin tear of left elbow without complication, initial encounter    ED Discharge Orders    None       Dione Booze, MD 01/06/18 [phone]

## 2018-01-06 NOTE — ED Notes (Signed)
[facility] called for transport.  

## 2018-01-06 NOTE — ED Notes (Signed)
Discharge and transport paperwork provided to [facility] upon patient's discharge.

## 2018-01-07 NOTE — Progress Notes (Signed)
I got a call from microbiology this morning that patient's blood culture (1/2) from 10/16 grew gram-positive rods.  Was not notified to me earlier during her hospital stay. Patient was discharged on 5 days of oral doxycycline for possible pneumonia on 10/18. I called the assisted living and spoke with patient's nurse.  On reviewing the chart patient was sent to the ED yesterday for a fall at the facility and has sustained some laceration in her forehead and left arm.  Imaging were unremarkable. As per the nurse patient's vitals have been stable, remained afebrile.  She was evaluated by hospice nurse yesterday and no issues. The patient will complete her antibiotics after tomorrow.  No further intervention needed.

## 2018-01-08 LAB — CULTURE, BLOOD (ROUTINE X 2): Culture: NO GROWTH

## 2018-01-30 ENCOUNTER — Emergency Department (HOSPITAL_COMMUNITY)

## 2018-01-30 ENCOUNTER — Encounter (HOSPITAL_COMMUNITY): Payer: Self-pay

## 2018-01-30 ENCOUNTER — Emergency Department (HOSPITAL_COMMUNITY)
Admission: EM | Admit: 2018-01-30 | Discharge: 2018-01-30 | Disposition: A | Discharge status: Home / Self Care | Attending: Emergency Medicine | Admitting: Emergency Medicine

## 2018-01-30 DIAGNOSIS — N183 Chronic kidney disease, stage 3 (moderate): Secondary | ICD-10-CM | POA: Diagnosis not present

## 2018-01-30 DIAGNOSIS — Y92129 Unspecified place in nursing home as the place of occurrence of the external cause: Secondary | ICD-10-CM | POA: Diagnosis not present

## 2018-01-30 DIAGNOSIS — I130 Hypertensive heart and chronic kidney disease with heart failure and stage 1 through stage 4 chronic kidney disease, or unspecified chronic kidney disease: Secondary | ICD-10-CM | POA: Insufficient documentation

## 2018-01-30 DIAGNOSIS — S51011A Laceration without foreign body of right elbow, initial encounter: Secondary | ICD-10-CM | POA: Diagnosis present

## 2018-01-30 DIAGNOSIS — E1122 Type 2 diabetes mellitus with diabetic chronic kidney disease: Secondary | ICD-10-CM | POA: Diagnosis not present

## 2018-01-30 DIAGNOSIS — I252 Old myocardial infarction: Secondary | ICD-10-CM | POA: Insufficient documentation

## 2018-01-30 DIAGNOSIS — S0990XA Unspecified injury of head, initial encounter: Secondary | ICD-10-CM | POA: Insufficient documentation

## 2018-01-30 DIAGNOSIS — Z8673 Personal history of transient ischemic attack (TIA), and cerebral infarction without residual deficits: Secondary | ICD-10-CM | POA: Diagnosis not present

## 2018-01-30 DIAGNOSIS — E785 Hyperlipidemia, unspecified: Secondary | ICD-10-CM | POA: Diagnosis not present

## 2018-01-30 DIAGNOSIS — I2510 Atherosclerotic heart disease of native coronary artery without angina pectoris: Secondary | ICD-10-CM | POA: Insufficient documentation

## 2018-01-30 DIAGNOSIS — Y998 Other external cause status: Secondary | ICD-10-CM | POA: Diagnosis not present

## 2018-01-30 DIAGNOSIS — I5032 Chronic diastolic (congestive) heart failure: Secondary | ICD-10-CM | POA: Diagnosis not present

## 2018-01-30 DIAGNOSIS — Y939 Activity, unspecified: Secondary | ICD-10-CM | POA: Insufficient documentation

## 2018-01-30 DIAGNOSIS — F0390 Unspecified dementia without behavioral disturbance: Secondary | ICD-10-CM | POA: Diagnosis not present

## 2018-01-30 DIAGNOSIS — Z86718 Personal history of other venous thrombosis and embolism: Secondary | ICD-10-CM | POA: Insufficient documentation

## 2018-01-30 DIAGNOSIS — Z79899 Other long term (current) drug therapy: Secondary | ICD-10-CM | POA: Insufficient documentation

## 2018-01-30 DIAGNOSIS — R0989 Other specified symptoms and signs involving the circulatory and respiratory systems: Secondary | ICD-10-CM

## 2018-01-30 DIAGNOSIS — Z87891 Personal history of nicotine dependence: Secondary | ICD-10-CM | POA: Diagnosis not present

## 2018-01-30 DIAGNOSIS — S0083XA Contusion of other part of head, initial encounter: Secondary | ICD-10-CM

## 2018-01-30 DIAGNOSIS — W19XXXA Unspecified fall, initial encounter: Secondary | ICD-10-CM | POA: Insufficient documentation

## 2018-01-30 LAB — CBC WITH DIFFERENTIAL/PLATELET
ABS IMMATURE GRANULOCYTES: 0.03 10*3/uL (ref 0.00–0.07)
BASOS PCT: 0 %
Basophils Absolute: 0 10*3/uL (ref 0.0–0.1)
Eosinophils Absolute: 0 10*3/uL (ref 0.0–0.5)
Eosinophils Relative: 1 %
HCT: 33 % — ABNORMAL LOW (ref 36.0–46.0)
Hemoglobin: 9.9 g/dL — ABNORMAL LOW (ref 12.0–15.0)
Immature Granulocytes: 0 %
Lymphocytes Relative: 9 %
Lymphs Abs: 0.8 10*3/uL (ref 0.7–4.0)
MCH: 29.4 pg (ref 26.0–34.0)
MCHC: 30 g/dL (ref 30.0–36.0)
MCV: 97.9 fL (ref 80.0–100.0)
MONOS PCT: 6 %
Monocytes Absolute: 0.6 10*3/uL (ref 0.1–1.0)
NEUTROS ABS: 7.4 10*3/uL (ref 1.7–7.7)
NEUTROS PCT: 84 %
Platelets: 220 10*3/uL (ref 150–400)
RBC: 3.37 MIL/uL — ABNORMAL LOW (ref 3.87–5.11)
RDW: 14 % (ref 11.5–15.5)
WBC: 8.9 10*3/uL (ref 4.0–10.5)
nRBC: 0 % (ref 0.0–0.2)

## 2018-01-30 LAB — BRAIN NATRIURETIC PEPTIDE: B Natriuretic Peptide: 1327.9 pg/mL — ABNORMAL HIGH (ref 0.0–100.0)

## 2018-01-30 LAB — BASIC METABOLIC PANEL
Anion gap: 10 (ref 5–15)
BUN: 55 mg/dL — AB (ref 8–23)
CO2: 21 mmol/L — AB (ref 22–32)
Calcium: 8.7 mg/dL — ABNORMAL LOW (ref 8.9–10.3)
Chloride: 110 mmol/L (ref 98–111)
Creatinine, Ser: 1.63 mg/dL — ABNORMAL HIGH (ref 0.44–1.00)
GFR calc Af Amer: 31 mL/min — ABNORMAL LOW (ref >60)
GFR calc non Af Amer: 27 mL/min — ABNORMAL LOW (ref >60)
GLUCOSE: 166 mg/dL — AB (ref 70–99)
POTASSIUM: 5 mmol/L (ref 3.5–5.1)
Sodium: 141 mmol/L (ref 135–145)

## 2018-01-30 LAB — TROPONIN I: Troponin I: 0.03 ng/mL (ref <0.03)

## 2018-01-30 MED ORDER — FUROSEMIDE 10 MG/ML IJ SOLN
40.0000 mg | Freq: Once | INTRAMUSCULAR | Status: AC
  Administered 2018-01-30: 40 mg via INTRAVENOUS
  Filled 2018-01-30: qty 4

## 2018-01-30 NOTE — ED Notes (Signed)
Bed: [id number] Expected date:  Expected time:  Means of arrival:  Comments: 84 yr old fall

## 2018-01-30 NOTE — ED Provider Notes (Addendum)
[facility]-EMERGENCY DEPT Provider Note: [name], MD, FACEP  CSN: [id number] MRN: [id number] ARRIVAL: 01/30/18 at 0001 ROOM: WA10/[facility]10   CHIEF COMPLAINT  Fall  Level 5 caveat: Dementia HISTORY OF PRESENT ILLNESS  01/30/18 12:24 AM [name] is a 84 y.o. female history of dementia on hospice care.  She was brought by EMS following an unwitnessed fall.  EMS reports a skin tear to the right elbow and bruising of the right cheek and chin.  She is complaining of pain in her right heel.  She is able to deny pain in her head or neck.  She was noted to be hypoxic and was placed on a nasal cannula with improvement.    Past Medical History:  Diagnosis Date  . Anemia   . Anxiety disorder   . Carotid artery occlusion   . Carotid bruit   . Chronic kidney disease (CKD), stage III (moderate) (HCC)   . Coronary artery disease    Remote PCI. Had BMS to RCA and DES stent to Diagonal in Jan. 2012   . Diastolic heart failure    EF 55 to 60% per cath in Jan 2012  . DVT (deep venous thrombosis) (HCC)   . Gout   . Hyperlipidemia   . Hypertension   . Ischemic cardiomyopathy    with prior EF of 35%  . NSTEMI (non-ST elevated myocardial infarction) [facility]) Jan 2012   with PCI to RCA and DX per Dr. Excell Seltzer  . Obesity   . Pneumonia 2016  . Proteinuria   . Stroke [facility]) 2013   ?  mini    . TIA (transient ischemic attack)   . Type II diabetes mellitus (HCC)    long standing    Past Surgical History:  Procedure Laterality Date  . ABDOMINAL AORTAGRAM N/A 01/17/2014   Procedure: ABDOMINAL Ronny Flurry;  Surgeon: Sherren Kerns, MD;  Location: [facility] CATH LAB;  Service: Cardiovascular;  Laterality: N/A;  . ANKLE FUSION Bilateral   . APPENDECTOMY    . CATARACT EXTRACTION, BILATERAL Bilateral   . CORONARY ANGIOPLASTY WITH STENT PLACEMENT  04/05/2010   distal RCA  . FRACTURE SURGERY Right 2005   bilateral ankles   . TONSILLECTOMY      Family History  Problem Relation Age of Onset  . Diabetes  Daughter   . CAD Neg Hx     Social History   Tobacco Use  . Smoking status: Former Games developer  . Smokeless tobacco: Never Used  . Tobacco comment: "smoked in my teens; quit after 1 year or so"  Substance Use Topics  . Alcohol use: No    Alcohol/week: 0.0 standard drinks  . Drug use: No    Prior to Admission medications   Medication Sig Start Date End Date Taking? Authorizing Provider  acetaminophen (TYLENOL) 500 MG tablet Take 500 mg by mouth every 6 (six) hours as needed for mild pain or fever.   Yes [provider]  BIDIL 20-37.5 MG tablet TAKE 1 TABLET BY MOUTH TWICE DAILY Patient taking differently: Take 1 tablet by mouth 2 (two) times daily.  05/03/17  Yes Kirt Boys, DO  cloNIDine (CATAPRES) 0.1 MG tablet Take 1 tablet (0.1 mg total) by mouth 2 (two) times daily. 03/22/17  Yes Marinda Elk, MD  furosemide (LASIX) 40 MG tablet Take 40 mg by mouth daily.   Yes [provider]  haloperidol (HALDOL) 2 MG/ML solution Take 5 mg by mouth daily.   Yes [provider]  hydrocortisone (  PROCTOZONE-HC) 2.5 % rectal cream APPLY TO RECTUM 3 TIMES DAILY FOR DISCOMFORT Patient taking differently: Place 1 application rectally 3 (three) times daily as needed for hemorrhoids or anal itching. APPLY TO RECTUM 3 TIMES DAILY FOR DISCOMFORT 08/02/17  Yes Gloeckner, Monica, DO  LOTEMAX 0.5 % GEL PLACE 1 DROP IN THE LEFT EYE TWICE DAILY Patient taking differently: Place 1 drop into the left eye 2 (two) times daily.  03/24/17  Yes Montez Moritaarter, Monica, DO  nebivolol (BYSTOLIC) 10 MG tablet Take 10 mg by mouth daily.   Yes [provider]  nitroGLYCERIN (NITROSTAT) 0.4 MG SL tablet Place one under tongue for chest pain, up to 3 tablets Patient taking differently: Place 0.4 mg under the tongue every 5 (five) minutes as needed for chest pain.  03/24/17  Yes Montez Moritaarter, Monica, DO  polyethylene glycol powder (GLYCOLAX/MIRALAX) powder Take 17 g by mouth daily.  01/16/17  Yes [provider]  timolol (BETIMOL) 0.5 % ophthalmic solution INSTILL 1 DROP INTO THE LEFT EYE DAILY Patient taking differently: Place 1 drop into the left eye daily.  03/24/17  Yes Montez Moritaarter, Monica, DO  feeding supplement, ENSURE ENLIVE, (ENSURE ENLIVE) LIQD Take 237 mLs by mouth 2 (two) times daily between meals. Patient not taking: Reported on 01/03/2018 12/11/16   Albertine GratesXu, Fang, MD    Allergies Namzaric [memantine hcl-donepezil hcl]; Percocet [oxycodone-acetaminophen]; Sulfa antibiotics; Adhesive [tape]; Metformin and related; Tradjenta [linagliptin]; and Penicillins   REVIEW OF SYSTEMS     PHYSICAL EXAMINATION  Initial Vital Signs Blood pressure (!) 173/71, pulse 71, temperature 98.2 F (36.8 C), temperature source Oral, resp. rate (!) 22, SpO2 98 %.  Examination General: Well-developed, well-nourished female in no acute distress; appearance consistent with age of record HENT: normocephalic; ecchymosis of right cheek and chin without bony deformity:    Eyes: pupils equal, round and reactive to light Neck: supple; nontender Heart: regular rate and rhythm; frequent PVCs Lungs: clear to auscultation bilaterally Abdomen: soft; nondistended; nontender; no masses or hepatosplenomegaly; bowel sounds present Extremities: No deformity; tenderness of right heel no tenderness on passive range of motion Neurologic: Awake, alert; motor function intact in all extremities and symmetric; no facial droop Skin: Warm and dry; skin tear right elbow:    Psychiatric: Flat affect   RESULTS  Summary of this visit's results, reviewed by myself:   EKG Interpretation  Date/Time:    Ventricular Rate:    PR Interval:    QRS Duration:   QT Interval:    QTC Calculation:   R Axis:     Text Interpretation:        Laboratory Studies: Results for orders placed or performed during the hospital encounter of 01/30/18 (from the past 24 hour(s))  CBC with Differential/Platelet     Status: Abnormal    Collection Time: 01/30/18  2:41 AM  Result Value Ref Range   WBC 8.9 4.0 - 10.5 K/uL   RBC 3.37 (L) 3.87 - 5.11 MIL/uL   Hemoglobin 9.9 (L) 12.0 - 15.0 g/dL   HCT 16.133.0 (L) 09.636.0 - 04.546.0 %   MCV 97.9 80.0 - 100.0 fL   MCH 29.4 26.0 - 34.0 pg   MCHC 30.0 30.0 - 36.0 g/dL   RDW 40.914.0 81.111.5 - 91.415.5 %   Platelets 220 150 - 400 K/uL   nRBC 0.0 0.0 - 0.2 %   Neutrophils Relative % 84 %   Neutro Abs 7.4 1.7 - 7.7 K/uL   Lymphocytes Relative 9 %   Lymphs Abs 0.8  0.7 - 4.0 K/uL   Monocytes Relative 6 %   Monocytes Absolute 0.6 0.1 - 1.0 K/uL   Eosinophils Relative 1 %   Eosinophils Absolute 0.0 0.0 - 0.5 K/uL   Basophils Relative 0 %   Basophils Absolute 0.0 0.0 - 0.1 K/uL   Immature Granulocytes 0 %   Abs Immature Granulocytes 0.03 0.00 - 0.07 K/uL  Basic metabolic panel     Status: Abnormal   Collection Time: 01/30/18  2:41 AM  Result Value Ref Range   Sodium 141 135 - 145 mmol/L   Potassium 5.0 3.5 - 5.1 mmol/L   Chloride 110 98 - 111 mmol/L   CO2 21 (L) 22 - 32 mmol/L   Glucose, Bld 166 (H) 70 - 99 mg/dL   BUN 55 (H) 8 - 23 mg/dL   Creatinine, Ser 1.61 (H) 0.44 - 1.00 mg/dL   Calcium 8.7 (L) 8.9 - 10.3 mg/dL   GFR calc non Af Amer 27 (L) >60 mL/min   GFR calc Af Amer 31 (L) >60 mL/min   Anion gap 10 5 - 15  Troponin I - ONCE - STAT     Status: Abnormal   Collection Time: 01/30/18  2:41 AM  Result Value Ref Range   Troponin I 0.03 (HH) <0.03 ng/mL  Brain natriuretic peptide     Status: Abnormal   Collection Time: 01/30/18  2:41 AM  Result Value Ref Range   B Natriuretic Peptide 1,327.9 (H) 0.0 - 100.0 pg/mL   Imaging Studies: Dg Chest 2 View  Result Date: 01/30/2018 CLINICAL DATA:  Status post unwitnessed fall, with hypoxia. Initial encounter. EXAM: CHEST - 2 VIEW COMPARISON:  Chest radiograph performed 01/03/2018 FINDINGS: The lungs are well-aerated. Vascular congestion is noted. Bibasilar airspace opacities raise concern for pneumonia, though mild interstitial edema cannot be  excluded. Small bilateral pleural effusions are noted. There is no evidence of pneumothorax. The heart is borderline normal in size. No acute osseous abnormalities are seen. Diffuse calcification is seen along the thoracic and proximal abdominal aorta. IMPRESSION: Vascular congestion. Bibasilar airspace opacities raise concern for pneumonia, though mild interstitial edema cannot be excluded. Small bilateral pleural effusions noted. Electronically Signed   By: Roanna Raider M.D.   On: 01/30/2018 01:27   Ct Head Wo Contrast  Result Date: 01/30/2018 CLINICAL DATA:  Unwitnessed fall.  Bruising. EXAM: CT HEAD WITHOUT CONTRAST TECHNIQUE: Contiguous axial images were obtained from the base of the skull through the vertex without intravenous contrast. COMPARISON:  January 06, 2018 FINDINGS: Brain: Evaluation limited due to motion. No subdural, epidural, or subarachnoid hemorrhage. Cerebellum, brainstem, and basal cisterns normal. Ventricles and sulci unchanged and prominent. Mild white matter changes identified. No acute cortical ischemia or infarct. No mass effect or midline shift. Vascular: Calcified atherosclerosis in the intracranial carotids. Skull: Normal. Negative for fracture or focal lesion. Sinuses/Orbits: No acute finding. Other: Soft tissue swelling in the right periorbital region. The globe is intact. IMPRESSION: No acute intracranial abnormalities. Electronically Signed   By: Gerome Sam III M.D   On: 01/30/2018 02:20   Dg Foot Complete Right  Result Date: 01/30/2018 CLINICAL DATA:  Status post unwitnessed fall. Acute onset of right lateral foot bruising. Initial encounter. EXAM: RIGHT FOOT COMPLETE - 3+ VIEW COMPARISON:  Right foot radiographs performed 01/06/2015 FINDINGS: There is no evidence of fracture or dislocation. Negative ulnar variance is noted. Hardware is noted at the distal tibia and fibula. The joint spaces are preserved. There is no evidence of talar subluxation;  the subtalar  joint is unremarkable in appearance. There is diffuse osteopenia of visualized osseous structures. No significant soft tissue abnormalities are seen. IMPRESSION: 1. No evidence of fracture or dislocation. Diffuse osteopenia of visualized osseous structures. 2. Hardware intact. Electronically Signed   By: Roanna Raider M.D.   On: 01/30/2018 01:25    ED COURSE and MDM  Nursing notes and initial vitals signs, including pulse oximetry, reviewed.  Vitals:   01/30/18 0004 01/30/18 0012 01/30/18 0321  BP:  (!) 173/71 (!) 175/106  Pulse:  71 80  Resp:  (!) 22 (!) 29  Temp:  98.2 F (36.8 C)   TempSrc:  Oral   SpO2: 96% 98% 96%   5:24 AM Patient dropped her oxygen saturation to 84% on room air but is in no apparent respiratory distress.  Oxygen saturation in the 90s on 2 L by nasal cannula.  Lasix 40 mg given IV but no diuresis yet.  She is on scheduled Lasix living facility.  5:37 AM Will discharge back to nursing home on oxygen by nasal cannula.  I do not see an indication for admission at this time given that she is a hospice patient no acute respiratory distress.  PROCEDURES    ED DIAGNOSES     ICD-10-CM   1. Fall at nursing home, initial encounter W19.XXXA    Y92.129   2. Contusion of face, initial encounter S00.83XA   3. Skin tear of right elbow without complication, initial encounter S51.011A        Jayen Bromwell, Jonny Ruiz, MD 01/30/18 0535    Paula Libra, MD 01/30/18 [phone]

## 2018-01-30 NOTE — ED Notes (Signed)
Patient transported to CT 

## 2018-01-30 NOTE — ED Triage Notes (Signed)
Pt was brought in by [facility] due to an unwitnessed fall. Per EMS, skin tear to right arm noted, bruising to right eye and chin noted, and pt c/o right leg pain. Denies taking blood thinners.

## 2018-01-30 NOTE — ED Notes (Signed)
[facility] contacted for transport 

## 2018-02-18 DEATH — deceased

## 2018-08-18 IMAGING — DX DG CHEST 1V PORT
1 series · 1 of 1 positions shown · non-contrast
Comparison: March 31, 2017

CLINICAL DATA: Evaluate ETT.

EXAM:
PORTABLE CHEST 1 VIEW

[chest ap]
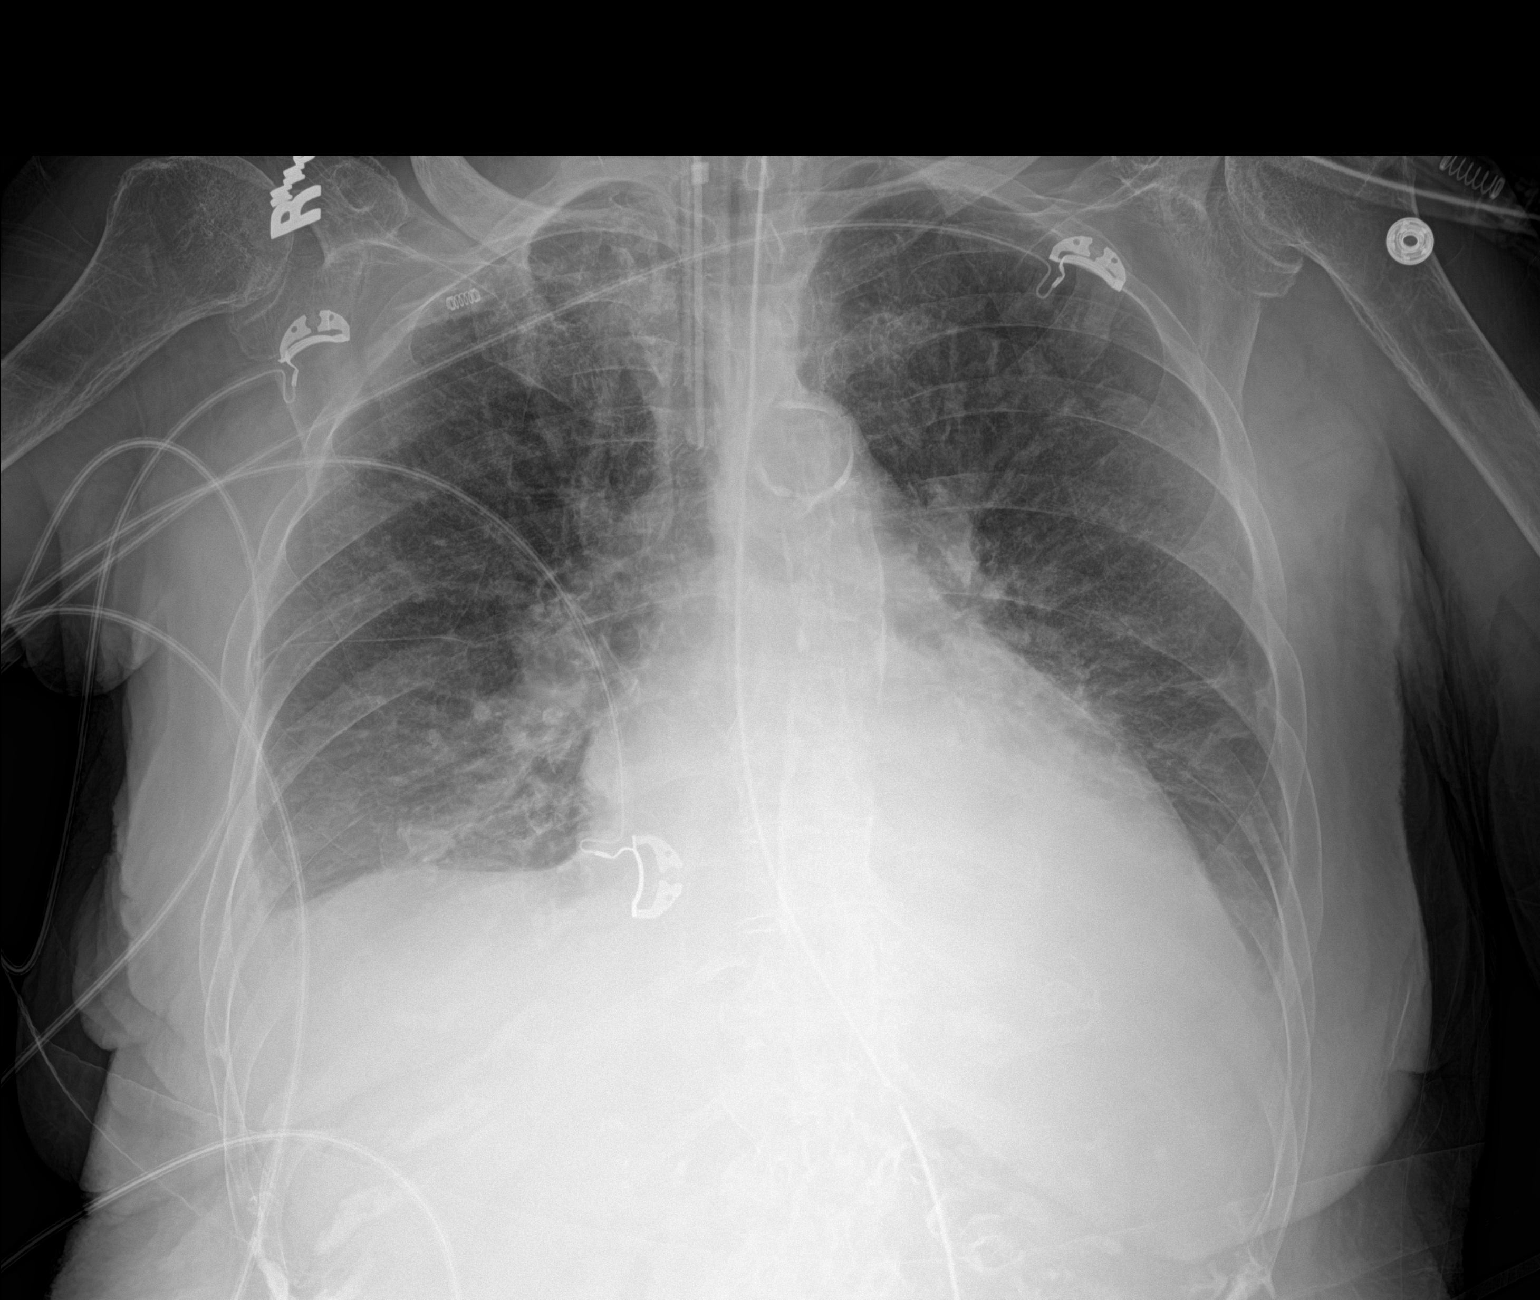

[1 of 1 positions shown; findings below may reference images not displayed]

FINDINGS: The ETT terminates 3 cm above the carina. The NG tube terminates
below today's film. No pneumothorax. Small effusions identified.
Left retrocardiac opacity is stable. Mild pulmonary venous
congestion with no overt edema. Stable cardiomediastinal silhouette.
IMPRESSION: 1. The ETT is in good position.
2. Small bilateral pleural effusions and left retrocardiac opacity
are stable. Mild pulmonary venous congestion without overt edema.

## 2018-08-19 IMAGING — DX DG CHEST 1V PORT
1 series · 1 of 1 positions shown · non-contrast
Comparison: One-view chest x-ray 04/01/2017.

CLINICAL DATA: Acute hypoxic respiratory failure and pulmonary
edema.

EXAM:
PORTABLE CHEST 1 VIEW

[chest ap]
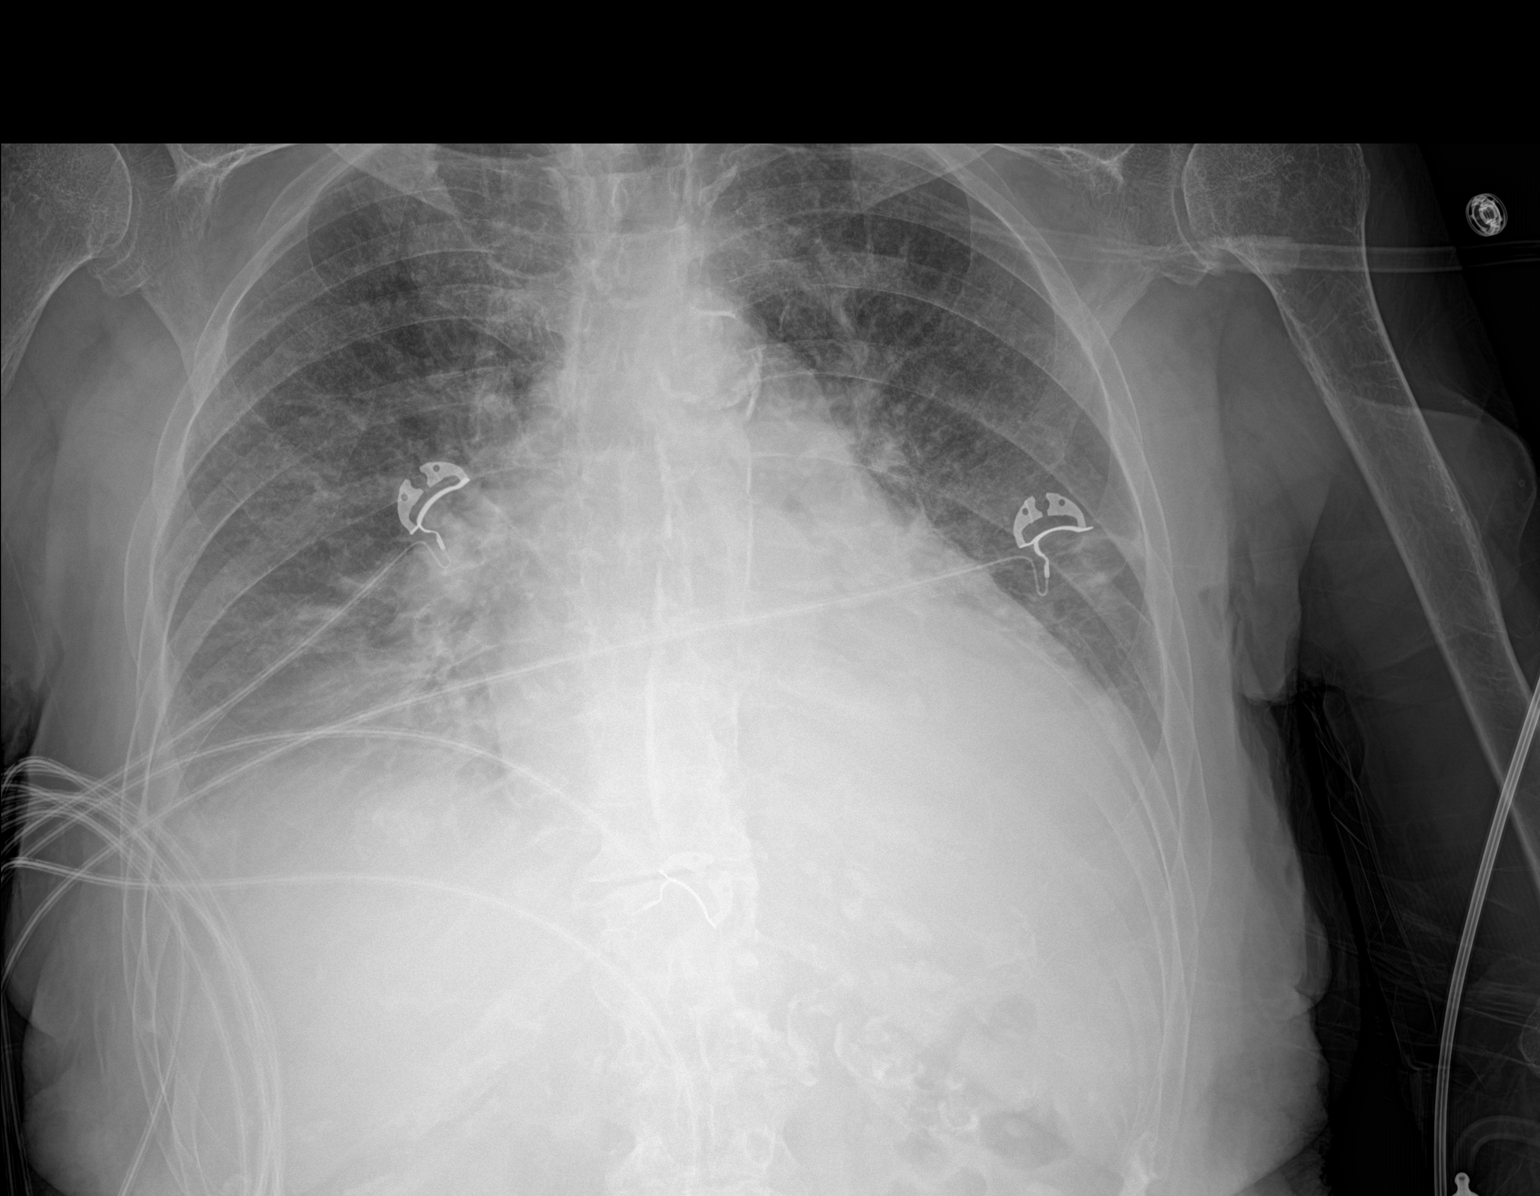

[1 of 1 positions shown; findings below may reference images not displayed]

FINDINGS: Heart is enlarged. The patient has been extubated. Dense
atherosclerotic calcifications are present.

Mild edema has increased slightly. Bilateral pleural effusions are
present. Bibasilar airspace disease likely reflects atelectasis.
IMPRESSION: 1. Interval extubation.
2. Cardiomegaly with increased interstitial edema and bilateral
effusions compatible with congestive heart failure.
3. Bibasilar airspace disease likely reflects atelectasis. Infection
is not excluded.
4.  Aortic Atherosclerosis ([id number]).
